# Patient Record
Sex: Female | Born: 1950
Health system: Southern US, Community
[De-identification: ages and names within clinical notes are randomized; demographics above are authoritative.]

## PROBLEM LIST (undated history)

## (undated) DIAGNOSIS — H269 Unspecified cataract: Secondary | ICD-10-CM

## (undated) DIAGNOSIS — E119 Type 2 diabetes mellitus without complications: Secondary | ICD-10-CM

## (undated) DIAGNOSIS — K635 Polyp of colon: Secondary | ICD-10-CM

## (undated) DIAGNOSIS — J40 Bronchitis, not specified as acute or chronic: Secondary | ICD-10-CM

## (undated) DIAGNOSIS — N200 Calculus of kidney: Secondary | ICD-10-CM

## (undated) DIAGNOSIS — I10 Essential (primary) hypertension: Secondary | ICD-10-CM

## (undated) DIAGNOSIS — G729 Myopathy, unspecified: Secondary | ICD-10-CM

## (undated) DIAGNOSIS — J45909 Unspecified asthma, uncomplicated: Secondary | ICD-10-CM

## (undated) DIAGNOSIS — C50919 Malignant neoplasm of unspecified site of unspecified female breast: Secondary | ICD-10-CM

## (undated) DIAGNOSIS — R600 Localized edema: Secondary | ICD-10-CM

## (undated) DIAGNOSIS — D649 Anemia, unspecified: Secondary | ICD-10-CM

## (undated) DIAGNOSIS — I447 Left bundle-branch block, unspecified: Secondary | ICD-10-CM

## (undated) DIAGNOSIS — F419 Anxiety disorder, unspecified: Secondary | ICD-10-CM

## (undated) DIAGNOSIS — K219 Gastro-esophageal reflux disease without esophagitis: Secondary | ICD-10-CM

## (undated) DIAGNOSIS — G4730 Sleep apnea, unspecified: Secondary | ICD-10-CM

## (undated) DIAGNOSIS — Z8669 Personal history of other diseases of the nervous system and sense organs: Secondary | ICD-10-CM

## (undated) DIAGNOSIS — N281 Cyst of kidney, acquired: Secondary | ICD-10-CM

## (undated) DIAGNOSIS — F32A Depression, unspecified: Secondary | ICD-10-CM

## (undated) DIAGNOSIS — M549 Dorsalgia, unspecified: Secondary | ICD-10-CM

## (undated) DIAGNOSIS — R05 Cough: Secondary | ICD-10-CM

## (undated) DIAGNOSIS — B9620 Unspecified Escherichia coli [E. coli] as the cause of diseases classified elsewhere: Secondary | ICD-10-CM

## (undated) DIAGNOSIS — E785 Hyperlipidemia, unspecified: Secondary | ICD-10-CM

## (undated) DIAGNOSIS — K529 Noninfective gastroenteritis and colitis, unspecified: Secondary | ICD-10-CM

## (undated) DIAGNOSIS — M1990 Unspecified osteoarthritis, unspecified site: Secondary | ICD-10-CM

## (undated) DIAGNOSIS — N390 Urinary tract infection, site not specified: Secondary | ICD-10-CM

## (undated) DIAGNOSIS — F329 Major depressive disorder, single episode, unspecified: Secondary | ICD-10-CM

## (undated) DIAGNOSIS — Z09 Encounter for follow-up examination after completed treatment for conditions other than malignant neoplasm: Secondary | ICD-10-CM

## (undated) HISTORY — PX: LAMINECTOMY: SHX219

## (undated) HISTORY — DX: Urinary tract infection, site not specified: B96.20

## (undated) HISTORY — DX: Type 2 diabetes mellitus without complications: E11.9

## (undated) HISTORY — DX: Essential (primary) hypertension: I10

## (undated) HISTORY — DX: Cyst of kidney, acquired: N28.1

## (undated) HISTORY — DX: Hyperlipidemia, unspecified: E78.5

## (undated) HISTORY — DX: Cough: R05

## (undated) HISTORY — DX: Unspecified cataract: H26.9

## (undated) HISTORY — DX: Noninfective gastroenteritis and colitis, unspecified: K52.9

## (undated) HISTORY — DX: Depression, unspecified: F32.A

## (undated) HISTORY — PX: ABDOMINAL HYSTERECTOMY: SHX81

## (undated) HISTORY — DX: Polyp of colon: K63.5

## (undated) HISTORY — PX: MASTECTOMY: SHX3

## (undated) HISTORY — DX: Urinary tract infection, site not specified: N39.0

## (undated) HISTORY — PX: TONSILLECTOMY: SUR1361

## (undated) HISTORY — DX: Major depressive disorder, single episode, unspecified: F32.9

## (undated) HISTORY — DX: Localized edema: R60.0

## (undated) HISTORY — DX: Calculus of kidney: N20.0

## (undated) HISTORY — DX: Myopathy, unspecified: G72.9

## (undated) HISTORY — PX: APPENDECTOMY: SHX54

## (undated) HISTORY — DX: Unspecified osteoarthritis, unspecified site: M19.90

## (undated) HISTORY — DX: Malignant neoplasm of unspecified site of unspecified female breast: C50.919

## (undated) HISTORY — DX: Unspecified Escherichia coli (E. coli) as the cause of diseases classified elsewhere: B96.20

## (undated) HISTORY — PX: INCONTINENCE SURGERY: SHX676

## (undated) HISTORY — PX: OOPHORECTOMY: SHX86

## (undated) HISTORY — DX: Dorsalgia, unspecified: M54.9

## (undated) HISTORY — DX: Bronchitis, not specified as acute or chronic: J40

## (undated) HISTORY — DX: Encounter for follow-up examination after completed treatment for conditions other than malignant neoplasm: Z09

---

## 1998-11-29 ENCOUNTER — Ambulatory Visit (HOSPITAL_COMMUNITY): Admission: RE | Admit: 1998-11-29 | Discharge: 1998-11-29 | Payer: Self-pay | Admitting: *Deleted

## 1998-11-29 ENCOUNTER — Encounter: Payer: Self-pay | Admitting: *Deleted

## 1998-12-10 ENCOUNTER — Emergency Department (HOSPITAL_COMMUNITY): Admission: EM | Admit: 1998-12-10 | Discharge: 1998-12-10 | Payer: Self-pay | Admitting: Emergency Medicine

## 1998-12-10 ENCOUNTER — Encounter: Payer: Self-pay | Admitting: Emergency Medicine

## 1998-12-17 ENCOUNTER — Encounter: Admission: RE | Admit: 1998-12-17 | Discharge: 1999-01-22 | Payer: Self-pay | Admitting: Family Medicine

## 1999-01-22 ENCOUNTER — Encounter: Admission: RE | Admit: 1999-01-22 | Discharge: 1999-04-22 | Payer: Self-pay | Admitting: Family Medicine

## 1999-07-02 ENCOUNTER — Encounter: Payer: Self-pay | Admitting: *Deleted

## 1999-07-02 ENCOUNTER — Encounter: Admission: RE | Admit: 1999-07-02 | Discharge: 1999-07-02 | Payer: Self-pay | Admitting: *Deleted

## 2000-06-30 ENCOUNTER — Ambulatory Visit (HOSPITAL_COMMUNITY): Admission: RE | Admit: 2000-06-30 | Discharge: 2000-06-30 | Payer: Self-pay | Admitting: Gastroenterology

## 2000-11-11 ENCOUNTER — Encounter: Payer: Self-pay | Admitting: Emergency Medicine

## 2000-11-11 ENCOUNTER — Emergency Department (HOSPITAL_COMMUNITY): Admission: EM | Admit: 2000-11-11 | Discharge: 2000-11-11 | Payer: Self-pay | Admitting: Emergency Medicine

## 2002-12-22 LAB — HM COLONOSCOPY: HM Colonoscopy: NORMAL

## 2003-01-04 ENCOUNTER — Encounter: Admission: RE | Admit: 2003-01-04 | Discharge: 2003-01-04 | Payer: Self-pay | Admitting: Endocrinology

## 2003-01-04 ENCOUNTER — Encounter: Payer: Self-pay | Admitting: Endocrinology

## 2003-08-17 ENCOUNTER — Emergency Department (HOSPITAL_COMMUNITY): Admission: EM | Admit: 2003-08-17 | Discharge: 2003-08-17 | Payer: Self-pay | Admitting: Emergency Medicine

## 2003-10-26 ENCOUNTER — Inpatient Hospital Stay (HOSPITAL_COMMUNITY): Admission: RE | Admit: 2003-10-26 | Discharge: 2003-10-28 | Payer: Self-pay | Admitting: Specialist

## 2004-01-16 ENCOUNTER — Ambulatory Visit (HOSPITAL_COMMUNITY): Admission: RE | Admit: 2004-01-16 | Discharge: 2004-01-16 | Payer: Self-pay | Admitting: Endocrinology

## 2004-02-29 ENCOUNTER — Ambulatory Visit: Payer: Self-pay | Admitting: Family Medicine

## 2004-03-14 ENCOUNTER — Ambulatory Visit: Payer: Self-pay | Admitting: Family Medicine

## 2004-04-04 ENCOUNTER — Ambulatory Visit: Payer: Self-pay | Admitting: Family Medicine

## 2004-04-04 ENCOUNTER — Ambulatory Visit: Payer: Self-pay | Admitting: *Deleted

## 2004-05-02 ENCOUNTER — Ambulatory Visit: Payer: Self-pay | Admitting: Family Medicine

## 2004-05-28 ENCOUNTER — Ambulatory Visit: Payer: Self-pay | Admitting: Family Medicine

## 2004-06-27 ENCOUNTER — Ambulatory Visit: Payer: Self-pay | Admitting: Family Medicine

## 2004-07-11 ENCOUNTER — Ambulatory Visit: Payer: Self-pay | Admitting: Family Medicine

## 2004-08-15 ENCOUNTER — Ambulatory Visit: Payer: Self-pay | Admitting: Family Medicine

## 2004-09-19 ENCOUNTER — Ambulatory Visit: Payer: Self-pay | Admitting: Family Medicine

## 2004-10-24 ENCOUNTER — Ambulatory Visit: Payer: Self-pay | Admitting: Family Medicine

## 2004-10-25 ENCOUNTER — Ambulatory Visit (HOSPITAL_COMMUNITY): Admission: RE | Admit: 2004-10-25 | Discharge: 2004-10-25 | Payer: Self-pay | Admitting: Family Medicine

## 2004-11-28 ENCOUNTER — Ambulatory Visit: Payer: Self-pay | Admitting: Family Medicine

## 2005-01-02 ENCOUNTER — Ambulatory Visit: Payer: Self-pay | Admitting: Family Medicine

## 2005-01-20 ENCOUNTER — Ambulatory Visit (HOSPITAL_COMMUNITY): Admission: RE | Admit: 2005-01-20 | Discharge: 2005-01-20 | Payer: Self-pay | Admitting: Family Medicine

## 2005-02-05 ENCOUNTER — Ambulatory Visit: Payer: Self-pay | Admitting: Family Medicine

## 2005-02-10 ENCOUNTER — Ambulatory Visit: Payer: Self-pay | Admitting: Family Medicine

## 2005-02-24 ENCOUNTER — Ambulatory Visit: Payer: Self-pay | Admitting: Family Medicine

## 2005-02-26 ENCOUNTER — Ambulatory Visit: Payer: Self-pay | Admitting: Family Medicine

## 2005-03-11 ENCOUNTER — Ambulatory Visit: Payer: Self-pay | Admitting: Family Medicine

## 2005-03-24 ENCOUNTER — Ambulatory Visit: Payer: Self-pay | Admitting: Family Medicine

## 2005-03-30 ENCOUNTER — Ambulatory Visit: Payer: Self-pay | Admitting: Family Medicine

## 2005-04-08 ENCOUNTER — Ambulatory Visit: Payer: Self-pay | Admitting: Family Medicine

## 2005-04-16 ENCOUNTER — Ambulatory Visit: Payer: Self-pay | Admitting: Family Medicine

## 2005-04-19 ENCOUNTER — Emergency Department (HOSPITAL_COMMUNITY): Admission: EM | Admit: 2005-04-19 | Discharge: 2005-04-20 | Payer: Self-pay | Admitting: Emergency Medicine

## 2005-04-20 ENCOUNTER — Ambulatory Visit: Payer: Self-pay | Admitting: Family Medicine

## 2005-04-24 ENCOUNTER — Ambulatory Visit: Payer: Self-pay | Admitting: Family Medicine

## 2005-04-30 ENCOUNTER — Inpatient Hospital Stay (HOSPITAL_COMMUNITY): Admission: EM | Admit: 2005-04-30 | Discharge: 2005-05-02 | Payer: Self-pay | Admitting: Emergency Medicine

## 2005-05-06 ENCOUNTER — Ambulatory Visit: Payer: Self-pay | Admitting: Family Medicine

## 2005-05-22 ENCOUNTER — Ambulatory Visit: Payer: Self-pay | Admitting: Family Medicine

## 2005-06-04 ENCOUNTER — Ambulatory Visit: Payer: Self-pay | Admitting: Family Medicine

## 2005-06-19 ENCOUNTER — Ambulatory Visit: Payer: Self-pay | Admitting: Family Medicine

## 2005-07-16 ENCOUNTER — Ambulatory Visit: Payer: Self-pay | Admitting: Family Medicine

## 2005-08-25 ENCOUNTER — Ambulatory Visit: Payer: Self-pay | Admitting: Family Medicine

## 2005-11-26 ENCOUNTER — Encounter
Admission: RE | Admit: 2005-11-26 | Discharge: 2006-02-24 | Payer: Self-pay | Admitting: Physical Medicine & Rehabilitation

## 2006-02-01 ENCOUNTER — Ambulatory Visit (HOSPITAL_COMMUNITY): Admission: RE | Admit: 2006-02-01 | Discharge: 2006-02-01 | Payer: Self-pay | Admitting: Internal Medicine

## 2006-02-01 ENCOUNTER — Ambulatory Visit: Payer: Self-pay | Admitting: Internal Medicine

## 2006-02-01 LAB — CONVERTED CEMR LAB
ALT: 14 units/L (ref 0–35)
AST: 16 units/L (ref 0–37)
Albumin: 4.3 g/dL (ref 3.5–5.2)
Alkaline Phosphatase: 66 units/L (ref 39–117)
BUN: 19 mg/dL (ref 6–23)
CO2: 27 meq/L (ref 19–32)
Calcium: 9.9 mg/dL (ref 8.4–10.5)
Chloride: 105 meq/L (ref 96–112)
Cholesterol: 288 mg/dL — ABNORMAL HIGH (ref 0–200)
Creatinine, Ser: 1.19 mg/dL (ref 0.40–1.20)
Glucose, Bld: 90 mg/dL (ref 70–99)
HDL: 43 mg/dL (ref >39)
LDL Cholesterol: 189 mg/dL — ABNORMAL HIGH (ref 0–99)
Potassium: 4.3 meq/L (ref 3.5–5.3)
Sodium: 146 meq/L — ABNORMAL HIGH (ref 135–145)
Total Bilirubin: 0.5 mg/dL (ref 0.3–1.2)
Total CHOL/HDL Ratio: 6.7
Total CK: 227 units/L — ABNORMAL HIGH (ref 7–177)
Total Protein: 7.3 g/dL (ref 6.0–8.3)
Triglycerides: 281 mg/dL — ABNORMAL HIGH (ref <150)
VLDL: 56 mg/dL — ABNORMAL HIGH (ref 0–40)

## 2006-02-08 ENCOUNTER — Ambulatory Visit: Payer: Self-pay | Admitting: Hospitalist

## 2006-02-10 ENCOUNTER — Ambulatory Visit (HOSPITAL_COMMUNITY): Admission: RE | Admit: 2006-02-10 | Discharge: 2006-02-10 | Payer: Self-pay | Admitting: Internal Medicine

## 2006-02-22 ENCOUNTER — Ambulatory Visit: Payer: Self-pay | Admitting: Internal Medicine

## 2006-02-22 DIAGNOSIS — E1159 Type 2 diabetes mellitus with other circulatory complications: Secondary | ICD-10-CM

## 2006-02-22 DIAGNOSIS — E1169 Type 2 diabetes mellitus with other specified complication: Secondary | ICD-10-CM | POA: Insufficient documentation

## 2006-02-22 DIAGNOSIS — F3289 Other specified depressive episodes: Secondary | ICD-10-CM | POA: Insufficient documentation

## 2006-02-22 DIAGNOSIS — I10 Essential (primary) hypertension: Secondary | ICD-10-CM

## 2006-02-22 DIAGNOSIS — I152 Hypertension secondary to endocrine disorders: Secondary | ICD-10-CM | POA: Insufficient documentation

## 2006-02-22 DIAGNOSIS — E785 Hyperlipidemia, unspecified: Secondary | ICD-10-CM

## 2006-02-22 DIAGNOSIS — F329 Major depressive disorder, single episode, unspecified: Secondary | ICD-10-CM | POA: Insufficient documentation

## 2006-02-26 ENCOUNTER — Encounter: Admission: RE | Admit: 2006-02-26 | Discharge: 2006-03-08 | Payer: Self-pay | Admitting: Internal Medicine

## 2006-03-01 ENCOUNTER — Ambulatory Visit: Payer: Self-pay | Admitting: Internal Medicine

## 2006-03-01 LAB — CONVERTED CEMR LAB
BUN: 31 mg/dL — ABNORMAL HIGH (ref 6–23)
CO2: 25 meq/L (ref 19–32)
Calcium: 9.4 mg/dL (ref 8.4–10.5)
Chloride: 107 meq/L (ref 96–112)
Creatinine, Ser: 1.51 mg/dL — ABNORMAL HIGH (ref 0.40–1.20)
Glucose, Bld: 101 mg/dL — ABNORMAL HIGH (ref 70–99)
Potassium: 4 meq/L (ref 3.5–5.3)
Sodium: 142 meq/L (ref 135–145)

## 2006-04-20 ENCOUNTER — Ambulatory Visit: Payer: Self-pay | Admitting: Internal Medicine

## 2006-04-20 ENCOUNTER — Encounter (INDEPENDENT_AMBULATORY_CARE_PROVIDER_SITE_OTHER): Payer: Self-pay | Admitting: Internal Medicine

## 2006-04-24 LAB — CONVERTED CEMR LAB
ALT: 11 units/L (ref 0–35)
AST: 16 units/L (ref 0–37)
Albumin: 4.3 g/dL (ref 3.5–5.2)
Alkaline Phosphatase: 65 units/L (ref 39–117)
BUN: 16 mg/dL (ref 6–23)
CO2: 26 meq/L (ref 19–32)
Calcium: 9.5 mg/dL (ref 8.4–10.5)
Chloride: 107 meq/L (ref 96–112)
Creatinine, Ser: 1.2 mg/dL (ref 0.40–1.20)
Free T4: 1.2 ng/dL (ref 0.89–1.80)
Glucose, Bld: 81 mg/dL (ref 70–99)
HCT: 36.7 % (ref 36.0–46.0)
Hemoglobin: 12.1 g/dL (ref 12.0–15.0)
MCHC: 33 g/dL (ref 30.0–36.0)
MCV: 80.7 fL (ref 78.0–100.0)
Platelets: 156 10*3/uL (ref 150–400)
Potassium: 4.1 meq/L (ref 3.5–5.3)
RBC: 4.55 M/uL (ref 3.87–5.11)
RDW: 14.6 % — ABNORMAL HIGH (ref 11.5–14.0)
Sodium: 143 meq/L (ref 135–145)
TSH: 1.307 microintl units/mL (ref 0.350–5.50)
Total Bilirubin: 0.7 mg/dL (ref 0.3–1.2)
Total Protein: 7.2 g/dL (ref 6.0–8.3)
WBC: 6.7 10*3/uL (ref 4.0–10.5)

## 2006-04-26 ENCOUNTER — Ambulatory Visit (HOSPITAL_COMMUNITY): Admission: RE | Admit: 2006-04-26 | Discharge: 2006-04-26 | Payer: Self-pay | Admitting: Internal Medicine

## 2006-04-27 ENCOUNTER — Encounter: Admission: RE | Admit: 2006-04-27 | Discharge: 2006-05-11 | Payer: Self-pay | Admitting: Internal Medicine

## 2006-05-04 ENCOUNTER — Encounter (INDEPENDENT_AMBULATORY_CARE_PROVIDER_SITE_OTHER): Payer: Self-pay | Admitting: *Deleted

## 2006-05-04 ENCOUNTER — Ambulatory Visit: Payer: Self-pay | Admitting: Internal Medicine

## 2006-05-04 LAB — CONVERTED CEMR LAB
BUN: 17 mg/dL (ref 6–23)
Bilirubin Urine: NEGATIVE
Bilirubin Urine: NEGATIVE
Blood in Urine, dipstick: NEGATIVE
CO2: 30 meq/L (ref 19–32)
Calcium: 10 mg/dL (ref 8.4–10.5)
Chloride: 99 meq/L (ref 96–112)
Creatinine, Ser: 1.16 mg/dL (ref 0.40–1.20)
Creatinine, Urine: 197.1 mg/dL
Glucose, Bld: 119 mg/dL — ABNORMAL HIGH (ref 70–99)
Glucose, Urine, Semiquant: NEGATIVE
Hemoglobin, Urine: NEGATIVE
Ketones, ur: NEGATIVE mg/dL
Ketones, urine, test strip: NEGATIVE
Leukocytes, UA: NEGATIVE
Microalb Creat Ratio: 8.1 mg/g (ref 0.0–30.0)
Microalb, Ur: 1.6 mg/dL (ref 0.00–1.89)
Nitrite: NEGATIVE
Nitrite: NEGATIVE
Potassium: 3.5 meq/L (ref 3.5–5.3)
Protein, U semiquant: NEGATIVE
Protein, ur: NEGATIVE mg/dL
Sodium: 141 meq/L (ref 135–145)
Specific Gravity, Urine: 1.015
Specific Gravity, Urine: 1.014 (ref 1.005–1.03)
Urine Glucose: NEGATIVE mg/dL
Urobilinogen, UA: 0.2
Urobilinogen, UA: 0.2 (ref 0.0–1.0)
WBC Urine, dipstick: NEGATIVE
pH: 5
pH: 6 (ref 5.0–8.0)

## 2006-05-19 ENCOUNTER — Ambulatory Visit: Payer: Self-pay | Admitting: Internal Medicine

## 2006-05-19 LAB — CONVERTED CEMR LAB
Candida species: NEGATIVE
Gardnerella vaginalis: NEGATIVE
Trichomonal Vaginitis: NEGATIVE

## 2006-05-26 LAB — CONVERTED CEMR LAB
BUN: 18 mg/dL (ref 6–23)
CO2: 25 meq/L (ref 19–32)
Calcium: 9.8 mg/dL (ref 8.4–10.5)
Chloride: 103 meq/L (ref 96–112)
Creatinine, Ser: 1.26 mg/dL — ABNORMAL HIGH (ref 0.40–1.20)
Glucose, Bld: 108 mg/dL — ABNORMAL HIGH (ref 70–99)
Potassium: 3.3 meq/L — ABNORMAL LOW (ref 3.5–5.3)
Sodium: 141 meq/L (ref 135–145)

## 2006-06-16 ENCOUNTER — Telehealth: Payer: Self-pay | Admitting: *Deleted

## 2006-08-17 ENCOUNTER — Ambulatory Visit: Payer: Self-pay | Admitting: Internal Medicine

## 2006-08-17 LAB — CONVERTED CEMR LAB
BUN: 23 mg/dL (ref 6–23)
CO2: 29 meq/L (ref 19–32)
Calcium: 9.8 mg/dL (ref 8.4–10.5)
Chloride: 103 meq/L (ref 96–112)
Creatinine, Ser: 1.27 mg/dL — ABNORMAL HIGH (ref 0.40–1.20)
Glucose, Bld: 96 mg/dL (ref 70–99)
Potassium: 3.8 meq/L (ref 3.5–5.3)
Sodium: 142 meq/L (ref 135–145)

## 2006-08-26 ENCOUNTER — Encounter (INDEPENDENT_AMBULATORY_CARE_PROVIDER_SITE_OTHER): Payer: Self-pay | Admitting: Internal Medicine

## 2006-08-26 ENCOUNTER — Ambulatory Visit (HOSPITAL_COMMUNITY): Admission: RE | Admit: 2006-08-26 | Discharge: 2006-08-26 | Payer: Self-pay | Admitting: Internal Medicine

## 2006-08-27 ENCOUNTER — Ambulatory Visit: Payer: Self-pay | Admitting: Internal Medicine

## 2006-08-30 ENCOUNTER — Encounter (INDEPENDENT_AMBULATORY_CARE_PROVIDER_SITE_OTHER): Payer: Self-pay | Admitting: Internal Medicine

## 2006-08-30 ENCOUNTER — Telehealth: Payer: Self-pay | Admitting: *Deleted

## 2006-08-30 DIAGNOSIS — N183 Chronic kidney disease, stage 3 unspecified: Secondary | ICD-10-CM | POA: Insufficient documentation

## 2006-08-30 DIAGNOSIS — I519 Heart disease, unspecified: Secondary | ICD-10-CM | POA: Insufficient documentation

## 2006-08-30 LAB — CONVERTED CEMR LAB
BUN: 24 mg/dL — ABNORMAL HIGH (ref 6–23)
CO2: 27 meq/L (ref 19–32)
Calcium: 9.6 mg/dL (ref 8.4–10.5)
Chloride: 103 meq/L (ref 96–112)
Cholesterol: 259 mg/dL — ABNORMAL HIGH (ref 0–200)
Creatinine, Ser: 1.38 mg/dL — ABNORMAL HIGH (ref 0.40–1.20)
Glucose, Bld: 100 mg/dL — ABNORMAL HIGH (ref 70–99)
HDL: 46 mg/dL (ref >39)
LDL Cholesterol: 176 mg/dL — ABNORMAL HIGH (ref 0–99)
Potassium: 4.1 meq/L (ref 3.5–5.3)
Sodium: 142 meq/L (ref 135–145)
Total CHOL/HDL Ratio: 5.6
Triglycerides: 186 mg/dL — ABNORMAL HIGH (ref <150)
VLDL: 37 mg/dL (ref 0–40)

## 2006-09-03 ENCOUNTER — Ambulatory Visit (HOSPITAL_COMMUNITY): Admission: RE | Admit: 2006-09-03 | Discharge: 2006-09-03 | Payer: Self-pay | Admitting: Internal Medicine

## 2006-10-22 ENCOUNTER — Telehealth (INDEPENDENT_AMBULATORY_CARE_PROVIDER_SITE_OTHER): Payer: Self-pay | Admitting: *Deleted

## 2006-10-22 ENCOUNTER — Ambulatory Visit: Payer: Self-pay | Admitting: Internal Medicine

## 2006-10-22 ENCOUNTER — Encounter (INDEPENDENT_AMBULATORY_CARE_PROVIDER_SITE_OTHER): Payer: Self-pay | Admitting: *Deleted

## 2006-10-22 LAB — CONVERTED CEMR LAB
Bilirubin Urine: NEGATIVE
Blood in Urine, dipstick: NEGATIVE
Glucose, Urine, Semiquant: NEGATIVE
Ketones, urine, test strip: NEGATIVE
Nitrite: NEGATIVE
Protein, U semiquant: NEGATIVE
Specific Gravity, Urine: 1.015
Urobilinogen, UA: 0.2
WBC Urine, dipstick: NEGATIVE
pH: 5

## 2006-10-23 LAB — CONVERTED CEMR LAB
ALT: 15 units/L (ref 0–35)
AST: 16 units/L (ref 0–37)
Albumin: 4.5 g/dL (ref 3.5–5.2)
Alkaline Phosphatase: 67 units/L (ref 39–117)
BUN: 16 mg/dL (ref 6–23)
Basophils Absolute: 0 10*3/uL (ref 0.0–0.1)
Basophils Relative: 1 % (ref 0–1)
CO2: 26 meq/L (ref 19–32)
Calcium: 9.5 mg/dL (ref 8.4–10.5)
Chloride: 106 meq/L (ref 96–112)
Creatinine, Ser: 1.27 mg/dL — ABNORMAL HIGH (ref 0.40–1.20)
Eosinophils Absolute: 0.2 10*3/uL (ref 0.0–0.7)
Eosinophils Relative: 4 % (ref 0–5)
Glucose, Bld: 87 mg/dL (ref 70–99)
HCT: 38 % (ref 36.0–46.0)
Hemoglobin: 12.6 g/dL (ref 12.0–15.0)
Lipase: 87 units/L — ABNORMAL HIGH (ref 0–75)
Lymphocytes Relative: 45 % (ref 12–46)
Lymphs Abs: 2.3 10*3/uL (ref 0.7–3.3)
MCHC: 33.2 g/dL (ref 30.0–36.0)
MCV: 82.3 fL (ref 78.0–100.0)
Monocytes Absolute: 0.3 10*3/uL (ref 0.2–0.7)
Monocytes Relative: 5 % (ref 3–11)
Neutro Abs: 2.3 10*3/uL (ref 1.7–7.7)
Neutrophils Relative %: 46 % (ref 43–77)
Platelets: 137 10*3/uL — ABNORMAL LOW (ref 150–400)
Potassium: 4.1 meq/L (ref 3.5–5.3)
RBC: 4.62 M/uL (ref 3.87–5.11)
RDW: 13.8 % (ref 11.5–14.0)
Sodium: 142 meq/L (ref 135–145)
Total Bilirubin: 0.5 mg/dL (ref 0.3–1.2)
Total Protein: 7.5 g/dL (ref 6.0–8.3)
WBC: 5.1 10*3/uL (ref 4.0–10.5)

## 2006-10-26 ENCOUNTER — Ambulatory Visit: Payer: Self-pay | Admitting: Infectious Disease

## 2006-10-28 ENCOUNTER — Encounter (INDEPENDENT_AMBULATORY_CARE_PROVIDER_SITE_OTHER): Payer: Self-pay | Admitting: *Deleted

## 2006-10-28 ENCOUNTER — Ambulatory Visit (HOSPITAL_COMMUNITY): Admission: RE | Admit: 2006-10-28 | Discharge: 2006-10-28 | Payer: Self-pay | Admitting: *Deleted

## 2007-01-13 ENCOUNTER — Telehealth (INDEPENDENT_AMBULATORY_CARE_PROVIDER_SITE_OTHER): Payer: Self-pay | Admitting: *Deleted

## 2007-01-14 ENCOUNTER — Telehealth: Payer: Self-pay | Admitting: *Deleted

## 2007-01-21 ENCOUNTER — Encounter (INDEPENDENT_AMBULATORY_CARE_PROVIDER_SITE_OTHER): Payer: Self-pay | Admitting: *Deleted

## 2007-01-21 ENCOUNTER — Ambulatory Visit: Payer: Self-pay | Admitting: Internal Medicine

## 2007-02-10 ENCOUNTER — Ambulatory Visit: Payer: Self-pay | Admitting: Internal Medicine

## 2007-02-10 ENCOUNTER — Encounter (INDEPENDENT_AMBULATORY_CARE_PROVIDER_SITE_OTHER): Payer: Self-pay | Admitting: *Deleted

## 2007-02-10 LAB — CONVERTED CEMR LAB
Cholesterol: 238 mg/dL — ABNORMAL HIGH (ref 0–200)
HDL: 43 mg/dL (ref >39)
LDL Cholesterol: 168 mg/dL — ABNORMAL HIGH (ref 0–99)
Total CHOL/HDL Ratio: 5.5
Triglycerides: 136 mg/dL (ref <150)
VLDL: 27 mg/dL (ref 0–40)

## 2007-02-15 ENCOUNTER — Encounter (INDEPENDENT_AMBULATORY_CARE_PROVIDER_SITE_OTHER): Payer: Self-pay | Admitting: *Deleted

## 2007-02-15 ENCOUNTER — Ambulatory Visit: Payer: Self-pay | Admitting: Internal Medicine

## 2007-02-15 LAB — CONVERTED CEMR LAB
ALT: 18 units/L (ref 0–35)
AST: 20 units/L (ref 0–37)
Albumin: 4.4 g/dL (ref 3.5–5.2)
Alkaline Phosphatase: 71 units/L (ref 39–117)
BUN: 17 mg/dL (ref 6–23)
CO2: 26 meq/L (ref 19–32)
Calcium: 9.4 mg/dL (ref 8.4–10.5)
Chloride: 105 meq/L (ref 96–112)
Creatinine, Ser: 1.2 mg/dL (ref 0.40–1.20)
Glucose, Bld: 108 mg/dL — ABNORMAL HIGH (ref 70–99)
Potassium: 3.9 meq/L (ref 3.5–5.3)
Sodium: 142 meq/L (ref 135–145)
Total Bilirubin: 0.4 mg/dL (ref 0.3–1.2)
Total Protein: 7.5 g/dL (ref 6.0–8.3)

## 2007-03-03 ENCOUNTER — Ambulatory Visit: Payer: Self-pay | Admitting: Infectious Diseases

## 2007-03-08 ENCOUNTER — Ambulatory Visit (HOSPITAL_COMMUNITY): Admission: RE | Admit: 2007-03-08 | Discharge: 2007-03-08 | Payer: Self-pay | Admitting: Orthopaedic Surgery

## 2007-04-15 ENCOUNTER — Encounter (INDEPENDENT_AMBULATORY_CARE_PROVIDER_SITE_OTHER): Payer: Self-pay | Admitting: Internal Medicine

## 2007-04-15 ENCOUNTER — Ambulatory Visit: Payer: Self-pay | Admitting: Internal Medicine

## 2007-04-15 LAB — CONVERTED CEMR LAB
BUN: 22 mg/dL (ref 6–23)
CO2: 24 meq/L (ref 19–32)
Calcium: 9.1 mg/dL (ref 8.4–10.5)
Chloride: 101 meq/L (ref 96–112)
Creatinine, Ser: 1.34 mg/dL — ABNORMAL HIGH (ref 0.40–1.20)
Glucose, Bld: 94 mg/dL (ref 70–99)
Potassium: 4.1 meq/L (ref 3.5–5.3)
Sodium: 142 meq/L (ref 135–145)

## 2007-04-18 ENCOUNTER — Ambulatory Visit: Payer: Self-pay | Admitting: Internal Medicine

## 2007-05-09 ENCOUNTER — Telehealth: Payer: Self-pay | Admitting: *Deleted

## 2007-05-09 ENCOUNTER — Ambulatory Visit: Payer: Self-pay | Admitting: Internal Medicine

## 2007-05-11 ENCOUNTER — Encounter (INDEPENDENT_AMBULATORY_CARE_PROVIDER_SITE_OTHER): Payer: Self-pay | Admitting: Internal Medicine

## 2007-05-11 ENCOUNTER — Ambulatory Visit: Payer: Self-pay | Admitting: Internal Medicine

## 2007-05-11 LAB — CONVERTED CEMR LAB
BUN: 19 mg/dL (ref 6–23)
CO2: 25 meq/L (ref 19–32)
Calcium: 9.6 mg/dL (ref 8.4–10.5)
Chloride: 106 meq/L (ref 96–112)
Creatinine, Ser: 1.28 mg/dL — ABNORMAL HIGH (ref 0.40–1.20)
Glucose, Bld: 88 mg/dL (ref 70–99)
Potassium: 3.9 meq/L (ref 3.5–5.3)
Sodium: 144 meq/L (ref 135–145)

## 2007-06-23 ENCOUNTER — Encounter (INDEPENDENT_AMBULATORY_CARE_PROVIDER_SITE_OTHER): Payer: Self-pay | Admitting: Internal Medicine

## 2007-06-23 ENCOUNTER — Ambulatory Visit: Payer: Self-pay | Admitting: Hospitalist

## 2007-06-23 LAB — CONVERTED CEMR LAB
BUN: 23 mg/dL (ref 6–23)
CO2: 24 meq/L (ref 19–32)
Calcium: 9.9 mg/dL (ref 8.4–10.5)
Chloride: 106 meq/L (ref 96–112)
Creatinine, Ser: 1.13 mg/dL (ref 0.40–1.20)
Glucose, Bld: 93 mg/dL (ref 70–99)
Potassium: 4.1 meq/L (ref 3.5–5.3)
Sodium: 143 meq/L (ref 135–145)

## 2007-07-08 ENCOUNTER — Encounter (INDEPENDENT_AMBULATORY_CARE_PROVIDER_SITE_OTHER): Payer: Self-pay | Admitting: Internal Medicine

## 2007-07-08 ENCOUNTER — Ambulatory Visit: Payer: Self-pay | Admitting: Internal Medicine

## 2007-07-11 LAB — CONVERTED CEMR LAB
Cholesterol: 231 mg/dL — ABNORMAL HIGH (ref 0–200)
HDL: 46 mg/dL (ref >39)
LDL Cholesterol: 156 mg/dL — ABNORMAL HIGH (ref 0–99)
Total CHOL/HDL Ratio: 5
Triglycerides: 143 mg/dL (ref <150)
VLDL: 29 mg/dL (ref 0–40)

## 2007-08-12 ENCOUNTER — Encounter (INDEPENDENT_AMBULATORY_CARE_PROVIDER_SITE_OTHER): Payer: Self-pay | Admitting: Internal Medicine

## 2007-08-12 ENCOUNTER — Ambulatory Visit: Payer: Self-pay | Admitting: Internal Medicine

## 2007-08-12 LAB — CONVERTED CEMR LAB
BUN: 17 mg/dL (ref 6–23)
CO2: 26 meq/L (ref 19–32)
Calcium: 9.7 mg/dL (ref 8.4–10.5)
Chloride: 104 meq/L (ref 96–112)
Creatinine, Ser: 1.35 mg/dL — ABNORMAL HIGH (ref 0.40–1.20)
Glucose, Bld: 84 mg/dL (ref 70–99)
Potassium: 4.4 meq/L (ref 3.5–5.3)
Sodium: 143 meq/L (ref 135–145)

## 2007-11-05 ENCOUNTER — Emergency Department (HOSPITAL_COMMUNITY): Admission: EM | Admit: 2007-11-05 | Discharge: 2007-11-05 | Payer: Self-pay | Admitting: Emergency Medicine

## 2007-11-07 ENCOUNTER — Ambulatory Visit: Payer: Self-pay | Admitting: Internal Medicine

## 2007-11-07 ENCOUNTER — Encounter (INDEPENDENT_AMBULATORY_CARE_PROVIDER_SITE_OTHER): Payer: Self-pay | Admitting: Internal Medicine

## 2007-11-07 LAB — CONVERTED CEMR LAB
ALT: 15 units/L (ref 0–35)
AST: 16 units/L (ref 0–37)
Albumin: 4.6 g/dL (ref 3.5–5.2)
Alkaline Phosphatase: 65 units/L (ref 39–117)
BUN: 31 mg/dL — ABNORMAL HIGH (ref 6–23)
CO2: 22 meq/L (ref 19–32)
Calcium: 9.5 mg/dL (ref 8.4–10.5)
Chloride: 105 meq/L (ref 96–112)
Creatinine, Ser: 1.41 mg/dL — ABNORMAL HIGH (ref 0.40–1.20)
Glucose, Bld: 167 mg/dL — ABNORMAL HIGH (ref 70–99)
Potassium: 4 meq/L (ref 3.5–5.3)
Sodium: 143 meq/L (ref 135–145)
Total Bilirubin: 0.4 mg/dL (ref 0.3–1.2)
Total CK: 292 units/L — ABNORMAL HIGH (ref 7–177)
Total Protein: 7.8 g/dL (ref 6.0–8.3)

## 2007-11-11 ENCOUNTER — Encounter: Admission: RE | Admit: 2007-11-11 | Discharge: 2007-11-11 | Payer: Self-pay | Admitting: Internal Medicine

## 2007-12-09 ENCOUNTER — Encounter (INDEPENDENT_AMBULATORY_CARE_PROVIDER_SITE_OTHER): Payer: Self-pay | Admitting: Internal Medicine

## 2007-12-09 ENCOUNTER — Ambulatory Visit: Payer: Self-pay | Admitting: Internal Medicine

## 2007-12-09 ENCOUNTER — Telehealth (INDEPENDENT_AMBULATORY_CARE_PROVIDER_SITE_OTHER): Payer: Self-pay | Admitting: Internal Medicine

## 2007-12-09 ENCOUNTER — Ambulatory Visit (HOSPITAL_COMMUNITY): Admission: RE | Admit: 2007-12-09 | Discharge: 2007-12-09 | Payer: Self-pay | Admitting: Internal Medicine

## 2007-12-09 LAB — CONVERTED CEMR LAB
ALT: 20 units/L (ref 0–35)
AST: 21 units/L (ref 0–37)
Albumin: 4.4 g/dL (ref 3.5–5.2)
Alkaline Phosphatase: 56 units/L (ref 39–117)
BUN: 14 mg/dL (ref 6–23)
Basophils Absolute: 0.1 10*3/uL (ref 0.0–0.1)
Basophils Relative: 1 % (ref 0–1)
CO2: 27 meq/L (ref 19–32)
Calcium: 9.6 mg/dL (ref 8.4–10.5)
Chloride: 106 meq/L (ref 96–112)
Creatinine, Ser: 1.3 mg/dL — ABNORMAL HIGH (ref 0.40–1.20)
Eosinophils Absolute: 0.3 10*3/uL (ref 0.0–0.7)
Eosinophils Relative: 5 % (ref 0–5)
Glucose, Bld: 79 mg/dL (ref 70–99)
HCT: 35 % — ABNORMAL LOW (ref 36.0–46.0)
Hemoglobin: 11.3 g/dL — ABNORMAL LOW (ref 12.0–15.0)
Lymphocytes Relative: 45 % (ref 12–46)
Lymphs Abs: 2.7 10*3/uL (ref 0.7–4.0)
MCHC: 32.3 g/dL (ref 30.0–36.0)
MCV: 83.7 fL (ref 78.0–100.0)
Monocytes Absolute: 0.4 10*3/uL (ref 0.1–1.0)
Monocytes Relative: 6 % (ref 3–12)
Neutro Abs: 2.6 10*3/uL (ref 1.7–7.7)
Neutrophils Relative %: 43 % (ref 43–77)
Platelets: 191 10*3/uL (ref 150–400)
Potassium: 3.5 meq/L (ref 3.5–5.3)
RBC: 4.18 M/uL (ref 3.87–5.11)
RDW: 13.6 % (ref 11.5–15.5)
Sodium: 144 meq/L (ref 135–145)
Total Bilirubin: 0.6 mg/dL (ref 0.3–1.2)
Total Protein: 7 g/dL (ref 6.0–8.3)
WBC: 6.1 10*3/uL (ref 4.0–10.5)

## 2007-12-16 ENCOUNTER — Ambulatory Visit (HOSPITAL_COMMUNITY): Admission: RE | Admit: 2007-12-16 | Discharge: 2007-12-16 | Payer: Self-pay | Admitting: Internal Medicine

## 2007-12-16 ENCOUNTER — Encounter (INDEPENDENT_AMBULATORY_CARE_PROVIDER_SITE_OTHER): Payer: Self-pay | Admitting: Internal Medicine

## 2007-12-19 ENCOUNTER — Ambulatory Visit (HOSPITAL_COMMUNITY): Admission: RE | Admit: 2007-12-19 | Discharge: 2007-12-19 | Payer: Self-pay | Admitting: Internal Medicine

## 2007-12-19 LAB — PULMONARY FUNCTION TEST

## 2007-12-30 ENCOUNTER — Telehealth (INDEPENDENT_AMBULATORY_CARE_PROVIDER_SITE_OTHER): Payer: Self-pay | Admitting: Internal Medicine

## 2007-12-30 ENCOUNTER — Ambulatory Visit: Payer: Self-pay | Admitting: Infectious Disease

## 2008-02-09 ENCOUNTER — Encounter (INDEPENDENT_AMBULATORY_CARE_PROVIDER_SITE_OTHER): Payer: Self-pay | Admitting: Internal Medicine

## 2008-02-09 ENCOUNTER — Ambulatory Visit: Payer: Self-pay | Admitting: Internal Medicine

## 2008-02-09 ENCOUNTER — Telehealth: Payer: Self-pay | Admitting: *Deleted

## 2008-02-09 DIAGNOSIS — J45991 Cough variant asthma: Secondary | ICD-10-CM | POA: Insufficient documentation

## 2008-02-14 ENCOUNTER — Encounter (INDEPENDENT_AMBULATORY_CARE_PROVIDER_SITE_OTHER): Payer: Self-pay | Admitting: Internal Medicine

## 2008-02-14 ENCOUNTER — Emergency Department (HOSPITAL_COMMUNITY): Admission: EM | Admit: 2008-02-14 | Discharge: 2008-02-14 | Payer: Self-pay | Admitting: Family Medicine

## 2008-02-22 ENCOUNTER — Ambulatory Visit: Payer: Self-pay | Admitting: Internal Medicine

## 2008-03-01 ENCOUNTER — Encounter (INDEPENDENT_AMBULATORY_CARE_PROVIDER_SITE_OTHER): Payer: Self-pay | Admitting: Internal Medicine

## 2008-03-09 ENCOUNTER — Encounter: Admission: RE | Admit: 2008-03-09 | Discharge: 2008-03-09 | Payer: Self-pay | Admitting: Internal Medicine

## 2008-03-21 ENCOUNTER — Ambulatory Visit: Payer: Self-pay | Admitting: Infectious Diseases

## 2008-03-21 ENCOUNTER — Encounter (INDEPENDENT_AMBULATORY_CARE_PROVIDER_SITE_OTHER): Payer: Self-pay | Admitting: Internal Medicine

## 2008-04-11 LAB — CONVERTED CEMR LAB
Cholesterol: 210 mg/dL — ABNORMAL HIGH (ref 0–200)
HDL: 47 mg/dL (ref >39)
LDL Cholesterol: 125 mg/dL — ABNORMAL HIGH (ref 0–99)
Total CHOL/HDL Ratio: 4.5
Triglycerides: 190 mg/dL — ABNORMAL HIGH (ref <150)
VLDL: 38 mg/dL (ref 0–40)

## 2008-04-18 ENCOUNTER — Ambulatory Visit: Payer: Self-pay | Admitting: Internal Medicine

## 2008-04-18 ENCOUNTER — Encounter (INDEPENDENT_AMBULATORY_CARE_PROVIDER_SITE_OTHER): Payer: Self-pay | Admitting: Internal Medicine

## 2008-04-18 ENCOUNTER — Encounter: Payer: Self-pay | Admitting: Internal Medicine

## 2008-04-18 LAB — CONVERTED CEMR LAB
ALT: 14 units/L (ref 0–35)
AST: 17 units/L (ref 0–37)
Albumin: 4.4 g/dL (ref 3.5–5.2)
Alkaline Phosphatase: 57 units/L (ref 39–117)
BUN: 19 mg/dL (ref 6–23)
CO2: 26 meq/L (ref 19–32)
Calcium: 9.5 mg/dL (ref 8.4–10.5)
Chloride: 105 meq/L (ref 96–112)
Creatinine, Ser: 1.13 mg/dL (ref 0.40–1.20)
Glucose, Bld: 99 mg/dL (ref 70–99)
Potassium: 4.2 meq/L (ref 3.5–5.3)
Sodium: 141 meq/L (ref 135–145)
Total Bilirubin: 0.6 mg/dL (ref 0.3–1.2)
Total Protein: 7.2 g/dL (ref 6.0–8.3)

## 2008-04-25 ENCOUNTER — Telehealth (INDEPENDENT_AMBULATORY_CARE_PROVIDER_SITE_OTHER): Payer: Self-pay | Admitting: Internal Medicine

## 2008-07-06 ENCOUNTER — Ambulatory Visit: Payer: Self-pay | Admitting: Internal Medicine

## 2008-07-06 ENCOUNTER — Encounter (INDEPENDENT_AMBULATORY_CARE_PROVIDER_SITE_OTHER): Payer: Self-pay | Admitting: Internal Medicine

## 2008-07-06 LAB — CONVERTED CEMR LAB
Bilirubin Urine: NEGATIVE
Hemoglobin, Urine: NEGATIVE
Ketones, ur: NEGATIVE mg/dL
Leukocytes, UA: NEGATIVE
Nitrite: NEGATIVE
Protein, ur: NEGATIVE mg/dL
Specific Gravity, Urine: 1.012 (ref 1.005–1.030)
Urine Glucose: NEGATIVE mg/dL
Urobilinogen, UA: 1 (ref 0.0–1.0)
pH: 6.5 (ref 5.0–8.0)

## 2008-07-11 ENCOUNTER — Ambulatory Visit (HOSPITAL_COMMUNITY): Admission: RE | Admit: 2008-07-11 | Discharge: 2008-07-11 | Payer: Self-pay | Admitting: Internal Medicine

## 2008-07-26 ENCOUNTER — Observation Stay (HOSPITAL_COMMUNITY): Admission: EM | Admit: 2008-07-26 | Discharge: 2008-07-27 | Payer: Self-pay | Admitting: Emergency Medicine

## 2008-07-26 ENCOUNTER — Ambulatory Visit: Payer: Self-pay | Admitting: Internal Medicine

## 2008-07-27 ENCOUNTER — Encounter: Payer: Self-pay | Admitting: Internal Medicine

## 2008-07-27 DIAGNOSIS — M549 Dorsalgia, unspecified: Secondary | ICD-10-CM | POA: Insufficient documentation

## 2008-07-31 ENCOUNTER — Ambulatory Visit: Payer: Self-pay | Admitting: Infectious Disease

## 2008-07-31 ENCOUNTER — Encounter (INDEPENDENT_AMBULATORY_CARE_PROVIDER_SITE_OTHER): Payer: Self-pay | Admitting: *Deleted

## 2008-07-31 LAB — CONVERTED CEMR LAB
Bilirubin Urine: NEGATIVE
Hemoglobin, Urine: NEGATIVE
Ketones, ur: NEGATIVE mg/dL
Leukocytes, UA: NEGATIVE
Nitrite: NEGATIVE
Protein, ur: NEGATIVE mg/dL
Specific Gravity, Urine: 1.016 (ref 1.005–1.030)
Urine Glucose: NEGATIVE mg/dL
Urobilinogen, UA: 0.2 (ref 0.0–1.0)
pH: 6 (ref 5.0–8.0)

## 2008-08-03 ENCOUNTER — Encounter (INDEPENDENT_AMBULATORY_CARE_PROVIDER_SITE_OTHER): Payer: Self-pay | Admitting: Internal Medicine

## 2008-08-14 ENCOUNTER — Encounter (INDEPENDENT_AMBULATORY_CARE_PROVIDER_SITE_OTHER): Payer: Self-pay | Admitting: Internal Medicine

## 2008-09-12 ENCOUNTER — Ambulatory Visit (HOSPITAL_COMMUNITY): Admission: RE | Admit: 2008-09-12 | Discharge: 2008-09-12 | Payer: Self-pay | Admitting: Internal Medicine

## 2008-09-12 ENCOUNTER — Ambulatory Visit: Payer: Self-pay | Admitting: Internal Medicine

## 2008-09-12 ENCOUNTER — Encounter (INDEPENDENT_AMBULATORY_CARE_PROVIDER_SITE_OTHER): Payer: Self-pay | Admitting: Internal Medicine

## 2008-09-14 LAB — CONVERTED CEMR LAB
ALT: 17 units/L (ref 0–35)
AST: 17 units/L (ref 0–37)
Albumin: 4.6 g/dL (ref 3.5–5.2)
Alkaline Phosphatase: 59 units/L (ref 39–117)
BUN: 19 mg/dL (ref 6–23)
Basophils Absolute: 0 10*3/uL (ref 0.0–0.1)
Basophils Relative: 1 % (ref 0–1)
CO2: 25 meq/L (ref 19–32)
Calcium: 9.8 mg/dL (ref 8.4–10.5)
Chloride: 105 meq/L (ref 96–112)
Cholesterol: 218 mg/dL — ABNORMAL HIGH (ref 0–200)
Creatinine, Ser: 1.57 mg/dL — ABNORMAL HIGH (ref 0.40–1.20)
Eosinophils Absolute: 0.2 10*3/uL (ref 0.0–0.7)
Eosinophils Relative: 3 % (ref 0–5)
GFR calc Af Amer: 41 mL/min — ABNORMAL LOW (ref >60)
GFR calc non Af Amer: 34 mL/min — ABNORMAL LOW (ref >60)
Glucose, Bld: 80 mg/dL (ref 70–99)
HCT: 36.6 % (ref 36.0–46.0)
HDL: 45 mg/dL (ref >39)
Hemoglobin: 12 g/dL (ref 12.0–15.0)
LDL Cholesterol: 131 mg/dL — ABNORMAL HIGH (ref 0–99)
Lymphocytes Relative: 53 % — ABNORMAL HIGH (ref 12–46)
Lymphs Abs: 3.2 10*3/uL (ref 0.7–4.0)
MCHC: 32.8 g/dL (ref 30.0–36.0)
MCV: 81.5 fL (ref 78.0–100.0)
Monocytes Absolute: 0.5 10*3/uL (ref 0.1–1.0)
Monocytes Relative: 8 % (ref 3–12)
Neutro Abs: 2.2 10*3/uL (ref 1.7–7.7)
Neutrophils Relative %: 36 % — ABNORMAL LOW (ref 43–77)
Platelets: 229 10*3/uL (ref 150–400)
Potassium: 4.2 meq/L (ref 3.5–5.3)
RBC: 4.49 M/uL (ref 3.87–5.11)
RDW: 13.3 % (ref 11.5–15.5)
Sodium: 142 meq/L (ref 135–145)
Total Bilirubin: 0.5 mg/dL (ref 0.3–1.2)
Total CHOL/HDL Ratio: 4.8
Total Protein: 7.3 g/dL (ref 6.0–8.3)
Triglycerides: 208 mg/dL — ABNORMAL HIGH (ref <150)
VLDL: 42 mg/dL — ABNORMAL HIGH (ref 0–40)
WBC: 6.1 10*3/uL (ref 4.0–10.5)

## 2008-09-27 ENCOUNTER — Telehealth: Payer: Self-pay | Admitting: *Deleted

## 2008-10-01 ENCOUNTER — Emergency Department (HOSPITAL_COMMUNITY): Admission: EM | Admit: 2008-10-01 | Discharge: 2008-10-01 | Payer: Self-pay | Admitting: Emergency Medicine

## 2008-10-10 ENCOUNTER — Ambulatory Visit: Payer: Self-pay | Admitting: Internal Medicine

## 2008-10-17 ENCOUNTER — Telehealth (INDEPENDENT_AMBULATORY_CARE_PROVIDER_SITE_OTHER): Payer: Self-pay | Admitting: Internal Medicine

## 2009-03-04 ENCOUNTER — Telehealth (INDEPENDENT_AMBULATORY_CARE_PROVIDER_SITE_OTHER): Payer: Self-pay | Admitting: Internal Medicine

## 2009-03-19 ENCOUNTER — Telehealth (INDEPENDENT_AMBULATORY_CARE_PROVIDER_SITE_OTHER): Payer: Self-pay | Admitting: *Deleted

## 2009-03-23 HISTORY — PX: BREAST SURGERY: SHX581

## 2009-03-23 LAB — HM MAMMOGRAPHY

## 2009-04-05 ENCOUNTER — Ambulatory Visit (HOSPITAL_COMMUNITY): Admission: RE | Admit: 2009-04-05 | Discharge: 2009-04-05 | Payer: Self-pay | Admitting: Internal Medicine

## 2009-04-12 ENCOUNTER — Encounter: Admission: RE | Admit: 2009-04-12 | Discharge: 2009-04-12 | Payer: Self-pay | Admitting: Internal Medicine

## 2009-04-26 ENCOUNTER — Ambulatory Visit: Payer: Self-pay | Admitting: Internal Medicine

## 2009-04-26 DIAGNOSIS — G479 Sleep disorder, unspecified: Secondary | ICD-10-CM | POA: Insufficient documentation

## 2009-04-26 LAB — CONVERTED CEMR LAB
ALT: 17 units/L (ref 0–35)
AST: 18 units/L (ref 0–37)
Albumin: 4.3 g/dL (ref 3.5–5.2)
Alkaline Phosphatase: 58 units/L (ref 39–117)
BUN: 20 mg/dL (ref 6–23)
CO2: 27 meq/L (ref 19–32)
Calcium: 9.1 mg/dL (ref 8.4–10.5)
Chloride: 104 meq/L (ref 96–112)
Cholesterol: 258 mg/dL — ABNORMAL HIGH (ref 0–200)
Creatinine, Ser: 1.17 mg/dL (ref 0.40–1.20)
Glucose, Bld: 74 mg/dL (ref 70–99)
HDL: 42 mg/dL (ref >39)
LDL Cholesterol: 177 mg/dL — ABNORMAL HIGH (ref 0–99)
Potassium: 3.9 meq/L (ref 3.5–5.3)
Sodium: 143 meq/L (ref 135–145)
Total Bilirubin: 0.5 mg/dL (ref 0.3–1.2)
Total CHOL/HDL Ratio: 6.1
Total Protein: 7.3 g/dL (ref 6.0–8.3)
Triglycerides: 193 mg/dL — ABNORMAL HIGH (ref <150)
VLDL: 39 mg/dL (ref 0–40)

## 2009-05-06 ENCOUNTER — Telehealth (INDEPENDENT_AMBULATORY_CARE_PROVIDER_SITE_OTHER): Payer: Self-pay | Admitting: Internal Medicine

## 2009-05-08 ENCOUNTER — Telehealth (INDEPENDENT_AMBULATORY_CARE_PROVIDER_SITE_OTHER): Payer: Self-pay | Admitting: Internal Medicine

## 2009-06-17 ENCOUNTER — Encounter (INDEPENDENT_AMBULATORY_CARE_PROVIDER_SITE_OTHER): Payer: Self-pay | Admitting: Internal Medicine

## 2009-07-16 ENCOUNTER — Encounter (INDEPENDENT_AMBULATORY_CARE_PROVIDER_SITE_OTHER): Payer: Self-pay | Admitting: Internal Medicine

## 2009-10-15 ENCOUNTER — Ambulatory Visit: Payer: Self-pay | Admitting: Internal Medicine

## 2009-10-15 DIAGNOSIS — K219 Gastro-esophageal reflux disease without esophagitis: Secondary | ICD-10-CM | POA: Insufficient documentation

## 2009-12-30 ENCOUNTER — Encounter: Payer: Self-pay | Admitting: Internal Medicine

## 2009-12-30 ENCOUNTER — Telehealth: Payer: Self-pay | Admitting: *Deleted

## 2010-01-08 ENCOUNTER — Ambulatory Visit: Payer: Self-pay | Admitting: Internal Medicine

## 2010-01-13 ENCOUNTER — Encounter: Admission: RE | Admit: 2010-01-13 | Discharge: 2010-01-13 | Payer: Self-pay | Admitting: Internal Medicine

## 2010-01-14 ENCOUNTER — Encounter: Admission: RE | Admit: 2010-01-14 | Discharge: 2010-01-14 | Payer: Self-pay | Admitting: Internal Medicine

## 2010-01-15 ENCOUNTER — Encounter: Payer: Self-pay | Admitting: Internal Medicine

## 2010-01-15 DIAGNOSIS — C50412 Malignant neoplasm of upper-outer quadrant of left female breast: Secondary | ICD-10-CM | POA: Insufficient documentation

## 2010-01-15 HISTORY — DX: Malignant neoplasm of upper-outer quadrant of left female breast: C50.412

## 2010-01-16 ENCOUNTER — Encounter: Admission: RE | Admit: 2010-01-16 | Discharge: 2010-01-16 | Payer: Self-pay | Admitting: Internal Medicine

## 2010-01-27 ENCOUNTER — Ambulatory Visit: Payer: Self-pay | Admitting: Internal Medicine

## 2010-01-27 ENCOUNTER — Telehealth: Payer: Self-pay | Admitting: Internal Medicine

## 2010-02-04 ENCOUNTER — Encounter (INDEPENDENT_AMBULATORY_CARE_PROVIDER_SITE_OTHER): Payer: Self-pay | Admitting: General Surgery

## 2010-02-04 ENCOUNTER — Ambulatory Visit (HOSPITAL_COMMUNITY): Admission: RE | Admit: 2010-02-04 | Discharge: 2010-02-05 | Payer: Self-pay | Admitting: General Surgery

## 2010-02-21 ENCOUNTER — Ambulatory Visit: Payer: Self-pay | Admitting: Internal Medicine

## 2010-02-21 DIAGNOSIS — K644 Residual hemorrhoidal skin tags: Secondary | ICD-10-CM | POA: Insufficient documentation

## 2010-02-24 ENCOUNTER — Ambulatory Visit (HOSPITAL_COMMUNITY)
Admission: RE | Admit: 2010-02-24 | Discharge: 2010-02-24 | Payer: Self-pay | Source: Home / Self Care | Admitting: Internal Medicine

## 2010-02-24 ENCOUNTER — Telehealth: Payer: Self-pay | Admitting: *Deleted

## 2010-02-24 ENCOUNTER — Ambulatory Visit: Payer: Self-pay | Admitting: Internal Medicine

## 2010-02-24 ENCOUNTER — Observation Stay (HOSPITAL_COMMUNITY)
Admission: AD | Admit: 2010-02-24 | Discharge: 2010-02-26 | Payer: Self-pay | Attending: Internal Medicine | Admitting: Internal Medicine

## 2010-02-24 LAB — CONVERTED CEMR LAB
Bilirubin Urine: NEGATIVE
Blood in Urine, dipstick: NEGATIVE
Glucose, Urine, Semiquant: NEGATIVE
HCT: 34.8 % — ABNORMAL LOW (ref 36.0–46.0)
Hemoglobin: 11.5 g/dL — ABNORMAL LOW (ref 12.0–15.0)
MCHC: 33 g/dL (ref 30.0–36.0)
MCV: 82.9 fL (ref >=78.0)
Nitrite: POSITIVE
Platelets: 327 10*3/uL (ref 150–400)
Protein, U semiquant: NEGATIVE
RBC: 4.2 M/uL (ref 3.87–5.11)
RDW: 13.1 % (ref 11.5–15.5)
Specific Gravity, Urine: 1.015
Urobilinogen, UA: 0.2
WBC: 9.4 10*3/uL (ref 4.0–10.5)
pH: 5

## 2010-02-26 ENCOUNTER — Ambulatory Visit: Payer: Self-pay | Admitting: Oncology

## 2010-02-26 ENCOUNTER — Encounter: Payer: Self-pay | Admitting: Internal Medicine

## 2010-02-27 ENCOUNTER — Encounter: Payer: Self-pay | Admitting: Internal Medicine

## 2010-02-27 LAB — CBC WITH DIFFERENTIAL/PLATELET
BASO%: 0.5 % (ref 0.0–2.0)
Basophils Absolute: 0 10*3/uL (ref 0.0–0.1)
EOS%: 3.7 % (ref 0.0–7.0)
Eosinophils Absolute: 0.2 10*3/uL (ref 0.0–0.5)
HCT: 28.7 % — ABNORMAL LOW (ref 34.8–46.6)
HGB: 9.6 g/dL — ABNORMAL LOW (ref 11.6–15.9)
LYMPH%: 38.9 % (ref 14.0–49.7)
MCH: 27 pg (ref 25.1–34.0)
MCHC: 33.4 g/dL (ref 31.5–36.0)
MCV: 80.6 fL (ref 79.5–101.0)
MONO#: 0.4 10*3/uL (ref 0.1–0.9)
MONO%: 5.7 % (ref 0.0–14.0)
NEUT#: 3.2 10*3/uL (ref 1.5–6.5)
NEUT%: 51.2 % (ref 38.4–76.8)
Platelets: 251 10*3/uL (ref 145–400)
RBC: 3.56 10*6/uL — ABNORMAL LOW (ref 3.70–5.45)
RDW: 13.1 % (ref 11.2–14.5)
WBC: 6.3 10*3/uL (ref 3.9–10.3)
lymph#: 2.4 10*3/uL (ref 0.9–3.3)

## 2010-02-27 LAB — COMPREHENSIVE METABOLIC PANEL
ALT: 16 U/L (ref 0–35)
AST: 22 U/L (ref 0–37)
Albumin: 4 g/dL (ref 3.5–5.2)
Alkaline Phosphatase: 63 U/L (ref 39–117)
BUN: 17 mg/dL (ref 6–23)
CO2: 24 mEq/L (ref 19–32)
Calcium: 9.1 mg/dL (ref 8.4–10.5)
Chloride: 107 mEq/L (ref 96–112)
Creatinine, Ser: 1.42 mg/dL — ABNORMAL HIGH (ref 0.40–1.20)
Glucose, Bld: 87 mg/dL (ref 70–99)
Potassium: 4.4 mEq/L (ref 3.5–5.3)
Sodium: 143 mEq/L (ref 135–145)
Total Bilirubin: 0.4 mg/dL (ref 0.3–1.2)
Total Protein: 6.5 g/dL (ref 6.0–8.3)

## 2010-03-04 ENCOUNTER — Encounter: Payer: Self-pay | Admitting: Internal Medicine

## 2010-03-05 ENCOUNTER — Encounter: Payer: Self-pay | Admitting: Internal Medicine

## 2010-03-06 ENCOUNTER — Ambulatory Visit: Payer: Self-pay | Admitting: Internal Medicine

## 2010-03-20 ENCOUNTER — Emergency Department (HOSPITAL_COMMUNITY)
Admission: EM | Admit: 2010-03-20 | Discharge: 2010-03-21 | Payer: Self-pay | Source: Home / Self Care | Admitting: Emergency Medicine

## 2010-03-25 ENCOUNTER — Ambulatory Visit: Admit: 2010-03-25 | Payer: Self-pay

## 2010-03-27 LAB — CBC WITH DIFFERENTIAL/PLATELET
BASO%: 0.6 % (ref 0.0–2.0)
Basophils Absolute: 0.1 10*3/uL (ref 0.0–0.1)
EOS%: 3.4 % (ref 0.0–7.0)
Eosinophils Absolute: 0.3 10*3/uL (ref 0.0–0.5)
HCT: 34.2 % — ABNORMAL LOW (ref 34.8–46.6)
HGB: 11.2 g/dL — ABNORMAL LOW (ref 11.6–15.9)
LYMPH%: 47.1 % (ref 14.0–49.7)
MCH: 26.3 pg (ref 25.1–34.0)
MCHC: 32.7 g/dL (ref 31.5–36.0)
MCV: 80.3 fL (ref 79.5–101.0)
MONO#: 0.6 10*3/uL (ref 0.1–0.9)
MONO%: 6.8 % (ref 0.0–14.0)
NEUT#: 4 10*3/uL (ref 1.5–6.5)
NEUT%: 42.1 % (ref 38.4–76.8)
Platelets: 218 10*3/uL (ref 145–400)
RBC: 4.26 10*6/uL (ref 3.70–5.45)
RDW: 13.8 % (ref 11.2–14.5)
WBC: 9.4 10*3/uL (ref 3.9–10.3)
lymph#: 4.5 10*3/uL — ABNORMAL HIGH (ref 0.9–3.3)
nRBC: 0 % (ref 0–0)

## 2010-03-27 LAB — COMPREHENSIVE METABOLIC PANEL
ALT: 11 U/L (ref 0–35)
AST: 12 U/L (ref 0–37)
Albumin: 4.2 g/dL (ref 3.5–5.2)
Alkaline Phosphatase: 85 U/L (ref 39–117)
BUN: 26 mg/dL — ABNORMAL HIGH (ref 6–23)
CO2: 29 mEq/L (ref 19–32)
Calcium: 9.7 mg/dL (ref 8.4–10.5)
Chloride: 104 mEq/L (ref 96–112)
Creatinine, Ser: 1.39 mg/dL — ABNORMAL HIGH (ref 0.40–1.20)
Glucose, Bld: 100 mg/dL — ABNORMAL HIGH (ref 70–99)
Potassium: 4 mEq/L (ref 3.5–5.3)
Sodium: 143 mEq/L (ref 135–145)
Total Bilirubin: 0.4 mg/dL (ref 0.3–1.2)
Total Protein: 7 g/dL (ref 6.0–8.3)

## 2010-03-28 ENCOUNTER — Ambulatory Visit (HOSPITAL_BASED_OUTPATIENT_CLINIC_OR_DEPARTMENT_OTHER): Payer: Medicare Other | Admitting: Oncology

## 2010-04-01 ENCOUNTER — Ambulatory Visit
Admission: RE | Admit: 2010-04-01 | Discharge: 2010-04-01 | Payer: Self-pay | Source: Home / Self Care | Attending: General Surgery | Admitting: General Surgery

## 2010-04-07 LAB — BASIC METABOLIC PANEL
BUN: 15 mg/dL (ref 6–23)
CO2: 27 mEq/L (ref 19–32)
Calcium: 9.9 mg/dL (ref 8.4–10.5)
Chloride: 105 mEq/L (ref 96–112)
Creatinine, Ser: 1.52 mg/dL — ABNORMAL HIGH (ref 0.4–1.2)
GFR calc Af Amer: 42 mL/min — ABNORMAL LOW (ref >60)
GFR calc non Af Amer: 35 mL/min — ABNORMAL LOW (ref >60)
Glucose, Bld: 114 mg/dL — ABNORMAL HIGH (ref 70–99)
Potassium: 4.3 mEq/L (ref 3.5–5.1)
Sodium: 143 mEq/L (ref 135–145)

## 2010-04-07 LAB — POCT HEMOGLOBIN-HEMACUE: Hemoglobin: 12.5 g/dL (ref 12.0–15.0)

## 2010-04-08 ENCOUNTER — Ambulatory Visit: Admit: 2010-04-08 | Payer: Self-pay | Admitting: Oncology

## 2010-04-10 ENCOUNTER — Ambulatory Visit
Admission: RE | Admit: 2010-04-10 | Discharge: 2010-04-10 | Payer: Self-pay | Source: Home / Self Care | Attending: Oncology | Admitting: Oncology

## 2010-04-10 LAB — COMPREHENSIVE METABOLIC PANEL
ALT: 13 U/L (ref 0–35)
AST: 14 U/L (ref 0–37)
Albumin: 4.6 g/dL (ref 3.5–5.2)
Alkaline Phosphatase: 78 U/L (ref 39–117)
BUN: 22 mg/dL (ref 6–23)
CO2: 26 mEq/L (ref 19–32)
Calcium: 10 mg/dL (ref 8.4–10.5)
Chloride: 103 mEq/L (ref 96–112)
Creatinine, Ser: 1.43 mg/dL — ABNORMAL HIGH (ref 0.40–1.20)
Glucose, Bld: 124 mg/dL — ABNORMAL HIGH (ref 70–99)
Potassium: 4.2 mEq/L (ref 3.5–5.3)
Sodium: 140 mEq/L (ref 135–145)
Total Bilirubin: 0.5 mg/dL (ref 0.3–1.2)
Total Protein: 7.5 g/dL (ref 6.0–8.3)

## 2010-04-10 LAB — CBC WITH DIFFERENTIAL/PLATELET
BASO%: 0.9 % (ref 0.0–2.0)
Basophils Absolute: 0.1 10*3/uL (ref 0.0–0.1)
EOS%: 8.7 % — ABNORMAL HIGH (ref 0.0–7.0)
Eosinophils Absolute: 0.5 10*3/uL (ref 0.0–0.5)
HCT: 35.2 % (ref 34.8–46.6)
HGB: 11.5 g/dL — ABNORMAL LOW (ref 11.6–15.9)
LYMPH%: 44.6 % (ref 14.0–49.7)
MCH: 25.9 pg (ref 25.1–34.0)
MCHC: 32.7 g/dL (ref 31.5–36.0)
MCV: 79.3 fL — ABNORMAL LOW (ref 79.5–101.0)
MONO#: 0.2 10*3/uL (ref 0.1–0.9)
MONO%: 4.2 % (ref 0.0–14.0)
NEUT#: 2.2 10*3/uL (ref 1.5–6.5)
NEUT%: 41.6 % (ref 38.4–76.8)
Platelets: 199 10*3/uL (ref 145–400)
RBC: 4.44 10*6/uL (ref 3.70–5.45)
RDW: 13.6 % (ref 11.2–14.5)
WBC: 5.3 10*3/uL (ref 3.9–10.3)
lymph#: 2.4 10*3/uL (ref 0.9–3.3)

## 2010-04-13 ENCOUNTER — Encounter: Payer: Self-pay | Admitting: Internal Medicine

## 2010-04-16 LAB — CBC WITH DIFFERENTIAL/PLATELET
BASO%: 0.6 % (ref 0.0–2.0)
Basophils Absolute: 0 10*3/uL (ref 0.0–0.1)
EOS%: 5.5 % (ref 0.0–7.0)
Eosinophils Absolute: 0.2 10*3/uL (ref 0.0–0.5)
HCT: 30.6 % — ABNORMAL LOW (ref 34.8–46.6)
HGB: 10.4 g/dL — ABNORMAL LOW (ref 11.6–15.9)
LYMPH%: 51.6 % — ABNORMAL HIGH (ref 14.0–49.7)
MCH: 27.2 pg (ref 25.1–34.0)
MCHC: 34 g/dL (ref 31.5–36.0)
MCV: 79.9 fL (ref 79.5–101.0)
MONO#: 0.1 10*3/uL (ref 0.1–0.9)
MONO%: 4.1 % (ref 0.0–14.0)
NEUT#: 1.2 10*3/uL — ABNORMAL LOW (ref 1.5–6.5)
NEUT%: 38.2 % — ABNORMAL LOW (ref 38.4–76.8)
Platelets: 239 10*3/uL (ref 145–400)
RBC: 3.82 10*6/uL (ref 3.70–5.45)
RDW: 14.5 % (ref 11.2–14.5)
WBC: 3.2 10*3/uL — ABNORMAL LOW (ref 3.9–10.3)
lymph#: 1.6 10*3/uL (ref 0.9–3.3)

## 2010-04-16 LAB — BASIC METABOLIC PANEL
BUN: 25 mg/dL — ABNORMAL HIGH (ref 6–23)
CO2: 26 mEq/L (ref 19–32)
Calcium: 9.3 mg/dL (ref 8.4–10.5)
Chloride: 103 mEq/L (ref 96–112)
Creatinine, Ser: 1.39 mg/dL — ABNORMAL HIGH (ref 0.40–1.20)
Glucose, Bld: 90 mg/dL (ref 70–99)
Potassium: 4.2 mEq/L (ref 3.5–5.3)
Sodium: 140 mEq/L (ref 135–145)

## 2010-04-22 NOTE — Miscellaneous (Signed)
Summary: needs lipid check  Clinical Lists Changes  Orders: Added new Test order of T-Lipid Profile [phone]) - Signed

## 2010-04-22 NOTE — Assessment & Plan Note (Signed)
Summary: FU OV/VS   Vital Signs:  Patient Profile:   60 Years Old Female Height:     68 inches (172.72 cm) Weight:      229.2 pounds (104.18 kg) BMI:     34.98 Temp:     98.2 degrees F (36.78 degrees C) oral Pulse rate:   94 / minute BP sitting:   146 / 85  (right arm)  Pt. in pain?   yes    Location:   lower right abd    Intensity:   6  Vitals Entered By: Hilda Blades Ditzler RN (October 26, 2006 3:11 PM)              Is Patient Diabetic? No Nutritional Status BMI of > 30 = obese Nutritional Status Detail good  Have you ever been in a relationship where you felt threatened, hurt or afraid?denies   Does patient need assistance? Functional Status Self care Ambulation Impaired:Risk for fall Comments Uses a cane.   Chief Complaint:  FU on pain- sl better.Marland Kitchen  History of Present Illness: Pt is a 60 yo woman with a hx of obesity, htn, hl, chronic back pain which I saw last week for pelvic pain. Since I last saw her, her pain has improved and has decreased to a 6/10. It has mostly localized to her R iliac fossa w/o radiation. Pt still denies fevers, chills, N/V/dysphagia/odynophagia/heartburn, diarrhea, constipation, rectal bleeding and melena. Appetite good. No other complaints. Took Mylanta about twice over the weekend.   Current Allergies (reviewed today): No known allergies     Risk Factors: Tobacco use:  never Drug use:  no Alcohol use:  no Seatbelt use:  100 %  Colonoscopy History:    Date of Last Colonoscopy:  12/22/2002  Mammogram History:    Date of Last Mammogram:  02/10/2006   Review of Systems      See HPI   Physical Exam  General:     alert, well-developed, well-nourished, and well-hydrated.   Head:     atraumatic.   Mouth:     OP clear, MMM Lungs:     CTAB Heart:     RRR Abdomen:     BS+, soft, TTP of RLQ with mild voluntary guarding. Rebound tenderness +. Murphy-. Pulses:     1+ posterior tibial. Extremities:     2+ mildly pitting feet  and ankle edema. Neurologic:     alert & oriented X3 and cranial nerves II-XII intact.  Walks with a cane 2/2 low back pain.    Impression & Recommendations:  Problem # 1:  PELVIC  PAIN (ICD-789.09) Pt's pain not significantly improved. Her labwork last week revealed normal LFTs, normal electrolytes. Her creatinine was mildly elevated (at baseline: 1.26) and her lipase was in the 80 range. I am not sure how to interpret her elevated lipase especially in the light of the fact that she has no other sx than RLQ pain.  Since she had an elective appendectomy while getting her TAH-BSO in the past, I am not worried for appendicitis or ovarian pathologies. In addition, the RLQ would be a surprising place to have diverticulitis. This could certainly be the result of abdominal adhesions but I wonder what the trigger would be to make this pain appear. I will get an abd-pelvic CT scan with contrast to r/o above mentioned entities. If scan completely normal, will suspect that adhesions are the etiology. Radiologist to contact me with results.  Her updated medication list for this problem includes:  Flexeril 10 Mg Tab (Cyclobenzaprine hcl) .Marland Kitchen... Take one tablet by mouth three times a day    Tramadol Hcl 50 Mg Tabs (Tramadol hcl) ..... One pill every 6 hours as needed for pain  Orders: CT with Contrast (CT w/ contrast)   Problem # 2:  RENAL INSUFFICIENCY, MILD (ICD-588.9) Since she is to receive CM, I instructed pt of the risks given her renal insufficiency. I instructed her to hydrate herself well for the days preceding her CT scan and afterwards as well. I will need to get a f/u BMET after her scan to monitor her renal fct.  Complete Medication List: 1)  Pravachol 10 Mg Tabs (Pravastatin sodium) 2)  Flexeril 10 Mg Tab (Cyclobenzaprine hcl) .... Take one tablet by mouth three times a day 3)  Norvasc 10 Mg Tabs (Amlodipine besylate) .... Take 1 tablet by mouth once a day 4)  Lisinopril 10 Mg Tabs  (Lisinopril) .... Take 1 tablet by mouth once a day 5)  Tramadol Hcl 50 Mg Tabs (Tramadol hcl) .... One pill every 6 hours as needed for pain 6)  Lasix 40 Mg Tabs (Furosemide) .... Once daily tab in am 7)  Kay Ciel 20 Meq Pack (Potassium chloride) .... 2 pills daily  with food 8)  Pravachol 20 Mg Tabs (Pravastatin sodium) .... Take 1 tablet by mouth at bedtime   Patient Instructions: 1)  I will get called with the results of the CT scan after which time I will call you. I will have you come back to the clinic for labwork after your CT scan to monitor your kidneys.

## 2010-04-22 NOTE — Assessment & Plan Note (Signed)
Summary: acute abd pain/doublebook per Dr. Joines/(bryant)/dms   Vital Signs:  Patient Profile:   60 Years Old Female Height:     68 inches (172.72 cm) Weight:      232.8 pounds (105.82 kg) BMI:     35.53 Temp:     99.3 degrees F (37.39 degrees C) oral Pulse rate:   106 / minute BP sitting:   172 / 95  (right arm)  Pt. in pain?   yes    Location:   lower abd    Intensity:   8              Is Patient Diabetic? No Nutritional Status BMI of > 30 = obese Nutritional Status Detail good  Have you ever been in a relationship where you felt threatened, hurt or afraid?denies   Does patient need assistance? Functional Status Self care Ambulation Impaired:Risk for fall Comments Uses a cane.   Chief Complaint:  Right and left lower abd pain off and on fo years- hyst..  History of Present Illness: Pt is a [name] with obesity, htn, dm-II, chronic back pain s/p surgery.  Pt's brother and sister-in-law have temporarily moved in with her. Brother s/p msk surgeries. Sister-in-law has emphysema and is on chronic O2. The arrangement was made so that they could keep an eye on each other. Has been more stressed out lately because of this. Her major complaint today is intermittent abdominal pain. Has had it more severely for 3 days but has had it on-off for years (since hysterectomy 27 yrs ago). Pressure sensation that started at 2/10 and progressed to 8/10 over the past 3 days. Deep in the L iliac fossa and travels to the R iliac fossa as well. Pain has been cst but not at same intensity during this time.  Denies fevers and chills. No other abdominal complaints: appetite good, no dysphagia/odynophagia, no heartburn, no nausea or vomiting, no other abd. pain, no diarrhea or constipation (daily brown formed stool). Denies dysuria, hematuria and urinary frequency. No hx of kidney stones. Had TAH-BSO and appendectomy.  Current Allergies (reviewed today): No known allergies   Past Surgical  History:    Hysterectomy for fibroids    Cervical laminectomy c5-c6, c4-c5 left  nitka 2005    Oophorectomy    Risk Factors: Tobacco use:  never Drug use:  no Alcohol use:  no Seatbelt use:  100 %  Colonoscopy History:    Date of Last Colonoscopy:  12/22/2002  Mammogram History:    Date of Last Mammogram:  02/10/2006   Review of Systems      See HPI   Physical Exam  General:     alert, well-developed, well-nourished, and well-hydrated.   Head:     normocephalic and atraumatic.  Eyes:     PERRLA, anicteric. Mouth:     OP clear, MMM, petechiae on sides of tongue (pt admits to brushing her tongue every day). Neck:     Supple, no lymphADNP/TM Lungs:     CTAB with good air mvt Heart:     RRR, no m/r/g Abdomen:     Obese, BS decreased, pale striae, lower midline scar, soft, TTP of RLQ  Pulses:     Peripheral pulses 2+ bilaterally. Extremities:     Trace bilateral edema. Neurologic:     alert & oriented X3, cranial nerves II-XII intact, strength normal in all extremities, and gait normal.      Impression & Recommendations:  Problem # 1:  PELVIC  PAIN (ICD-789.09) Acute on chronic. This episode more severe. 2 normal abd. CT scans over the last year so I do not feel compelled to repeat one at this time especially since she does not have an acute abdomen. Will check a CMET to eval. renal fct and LFTs, a UA to r/o an atypical presentation for UTI, lipase to r/o atypical presentation for pancreatitis and a CBC to r/o leukocytosis and/or L shift. Will f/u early next week and if sx have not improved or progressed, will then consider imaging.  Her updated medication list for this problem includes:    Flexeril 10 Mg Tab (Cyclobenzaprine hcl) .Marland Kitchen... Take one tablet by mouth three times a day    Tramadol Hcl 50 Mg Tabs (Tramadol hcl) ..... One pill every 6 hours as needed for pain  Orders: T-Comprehensive Metabolic Panel ([id number]) T-CBC w/Diff  [id number]) T-Urinalysis Dipstick only [id number]) T-Lipase [id number])   Complete Medication List: 1)  Pravachol 10 Mg Tabs (Pravastatin sodium) 2)  Flexeril 10 Mg Tab (Cyclobenzaprine hcl) .... Take one tablet by mouth three times a day 3)  Norvasc 10 Mg Tabs (Amlodipine besylate) .... Take 1 tablet by mouth once a day 4)  Lisinopril 10 Mg Tabs (Lisinopril) .... Take 1 tablet by mouth once a day 5)  Tramadol Hcl 50 Mg Tabs (Tramadol hcl) .... One pill every 6 hours as needed for pain 6)  Lasix 40 Mg Tabs (Furosemide) .... Once daily tab in am 7)  Kay Ciel 20 Meq Pack (Potassium chloride) .... 2 pills daily  with food 8)  Pravachol 20 Mg Tabs (Pravastatin sodium) .... Take 1 tablet by mouth at bedtime   Patient Instructions: 1)  Keep your appointment on Tuesday (I will trade with Dr. Olevia Bowens to see you). 2)  I will call you this evening if there is anything wrong with your labwork. 3)  If your pain increases over the weekend or if you start vomiting or having blood in your stools, go to the emergency department.         Laboratory Results   Urine Tests  Date/Time Recieved: October 22, 2006 3:46 PM  Date/Time Reported: ..................................................................Marland KitchenMelvia Heaps  October 22, 2006 3:46 PM   Routine Urinalysis   Color: yellow Appearance: Clear Glucose: negative   (Normal Range: Negative) Bilirubin: negative   (Normal Range: Negative) Ketone: negative   (Normal Range: Negative) Spec. Gravity: 1.015   (Normal Range: 1.003-1.035) Blood: negative   (Normal Range: Negative) pH: 5.0   (Normal Range: 5.0-8.0) Protein: negative   (Normal Range: Negative) Urobilinogen: 0.2   (Normal Range: 0-1) Nitrite: negative   (Normal Range: Negative) Leukocyte Esterace: negative   (Normal Range: Negative)

## 2010-04-22 NOTE — Progress Notes (Signed)
Summary: lu shot/gp  Phone Note Call from Patient   Summary of Call: Received fax from [place]; pt. had flu shot today 12/30/09. Initial call taken by: Morrison Old RN,  December 30, 2009 3:40 PM      Influenza Immunization History:    Influenza # 1:  Historical (12/30/2009)

## 2010-04-22 NOTE — Assessment & Plan Note (Signed)
Summary: ACUTE-CONGESTION ADD PER [facility](DEVANI)   Vital Signs:  Patient profile:   60 year old female Height:      66.5 inches Weight:      231.1 pounds BMI:     36.87 Temp:     97.7 degrees F oral Pulse rate:   84 / minute BP sitting:   151 / 91  (right arm)  Vitals Entered By: Silverio Decamp NT II (January 27, 2010 9:56 AM) CC: [facility] CONJESTION Is Patient Diabetic? No  Have you ever been in a relationship where you felt threatened, hurt or afraid?No   Does patient need assistance? Functional Status Self care Ambulation Normal   Primary Care Evvie Behrmann:  Lester Edgewood MD  CC:  [facility] [place].  History of Present Illness: 60 y/o woman with PMH significant for newly diagnosed breast cancer comes into the clinic today for common cold that started 2 weeks ago. She describes her symptoms as feeling congested or stuffing, accompaied by sneezing, watering from her eyes, cough, sore throat. But she denies fever,chills, malaise, N/V/D.  She has tried a lot of OTC medicines and normal saline drops which is not helping her. Her inhalers incl advair and ventolin are also not helping her.  She has surgery on 11/15 for her breast cancer and was concerned about what medication should she take, that do not interfer with her surgery.  Preventive Screening-Counseling & Management  Alcohol-Tobacco     Alcohol drinks/day: 0     Smoking Status: never     Passive Smoke Exposure: no  Caffeine-Diet-Exercise     Does Patient Exercise: no  Current Medications (verified): 1)  Norvasc 10 Mg Tabs (Amlodipine Besylate) .... Take 1 Tablet By Mouth Once A Day 2)  Lisinopril 40 Mg  Tabs (Lisinopril) .... Take 1 Tablet By Mouth Once A Day 3)  Hydrochlorothiazide 25 Mg  Tabs (Hydrochlorothiazide) .... Take 1 Tablet By Mouth Once A Day 4)  Albuterol 90 Mcg/act  Aers (Albuterol) .... Take 2 Puffs Every 4 Hours As Needed 5)  Advair Diskus 100-50 Mcg/dose Misc (Fluticasone-Salmeterol) .... Take 1 Puff  Twice Daily 6)  Ativan 1 Mg Tabs (Lorazepam) .... Take One Tablet As Needed For Panic Attack. 7)  Pantoprazole Sodium 40 Mg Tbec (Pantoprazole Sodium) .... Take 1 Tablet By Mouth Once A Day 8)  Tramadol Hcl 50 Mg Tabs (Tramadol Hcl) .Marland Kitchen.. 1 Tablet Every 6 Hrs As Needed For Pain 9)  Hydrocod Polst-Chlorphen Polst 10-8 Mg/43ml Lqcr (Hydrocod Polst-Chlorphen Polst) .... Take 1 Teaspoon Two Times A Day 10)  Zithromax 1 Gm Pack (Azithromycin) .... Take 500 Mg On First Day Followed By 250 Mg For 4 Days.  Allergies (verified): 1)  ! Crestor 2)  Lipitor  Review of Systems      See HPI  The patient denies fever, chest pain, dyspnea on exertion, and headaches.    Physical Exam  General:  alert, well-developed, well-nourished, and well-hydrated.   Head:  normocephalic and atraumatic.   Eyes:  pupils equal, pupils round, pupils reactive to light, and pupils react to accomodation.   Neck:  supple and full ROM.   Lungs:  normal respiratory effort, no intercostal retractions, no accessory muscle use, normal breath sounds, no dullness, no fremitus, no crackles, and no wheezes.   Heart:  normal rate, regular rhythm, no murmur, no gallop, no rub, and no JVD.   Abdomen:  soft, non-tender, normal bowel sounds, no distention, and no masses.   Msk:  normal ROM, no joint tenderness,  and no joint swelling.   Pulses:  2+pulses b/l Extremities:  no edema, no clubbing Neurologic:  alert & oriented X3, cranial nerves II-XII intact, strength normal in all extremities, sensation intact to light touch, gait normal, and DTRs symmetrical and normal.     Impression & Recommendations:  Problem # 1:  U R I (ICD-465.9) Assessment New She reports feeling stuffy or congested accompained by sneezing,watering from her eyes, cough, sore throat that has been ongoing for 2 weeks. She has tried OTC NS drops , staem inhalation but these rx are not helping her.Considering the duration of her symptoms we would treat her for  bacterial URI. We started her on Z- pak and asked her to call the clinic if  her s/s does not improve. Encouraged her to continue with steam inhalation and warm saline gargles. Her updated medication list for this problem includes:    Hydrocod Polst-chlorphen Polst 10-8 Mg/5ml Lqcr (Hydrocod polst-chlorphen polst) .Marland Kitchen... Take 1 teaspoon two times a day  Problem # 2:  HYPERTENSION (ICD-401.9) Assessment: Unchanged Her BP was high(151/91) when she first presented to the clinic, that could be exertional. A repeat BP reading was 130/90.She also reported that she has not taken her morning dose. Her updated medication list for this problem includes:    Norvasc 10 Mg Tabs (Amlodipine besylate) .Marland Kitchen... Take 1 tablet by mouth once a day    Lisinopril 40 Mg Tabs (Lisinopril) .Marland Kitchen... Take 1 tablet by mouth once a day    Hydrochlorothiazide 25 Mg Tabs (Hydrochlorothiazide) .Marland Kitchen... Take 1 tablet by mouth once a day  Complete Medication List: 1)  Norvasc 10 Mg Tabs (Amlodipine besylate) .... Take 1 tablet by mouth once a day 2)  Lisinopril 40 Mg Tabs (Lisinopril) .... Take 1 tablet by mouth once a day 3)  Hydrochlorothiazide 25 Mg Tabs (Hydrochlorothiazide) .... Take 1 tablet by mouth once a day 4)  Albuterol 90 Mcg/act Aers (Albuterol) .... Take 2 puffs every 4 hours as needed 5)  Advair Diskus 100-50 Mcg/dose Misc (Fluticasone-salmeterol) .... Take 1 puff twice daily 6)  Ativan 1 Mg Tabs (Lorazepam) .... Take one tablet as needed for panic attack. 7)  Pantoprazole Sodium 40 Mg Tbec (Pantoprazole sodium) .... Take 1 tablet by mouth once a day 8)  Tramadol Hcl 50 Mg Tabs (Tramadol hcl) .Marland Kitchen.. 1 tablet every 6 hrs as needed for pain 9)  Hydrocod Polst-chlorphen Polst 10-8 Mg/30ml Lqcr (Hydrocod polst-chlorphen polst) .... Take 1 teaspoon two times a day 10)  Zithromax 1 Gm Pack (Azithromycin) .... Take 500 mg on first day followed by 250 mg for 4 days.  Patient Instructions: 1)  Pleasetake your medicines as  prescribed. 2)  Please schedule a follow-up appointment in 2 months. 3)  Please call the clinic if your symptoms get worse. 4)  Please continue with the consevative measures which include warm saline gargles and steam inhalation. Prescriptions: ZITHROMAX 1 GM PACK (AZITHROMYCIN) take 500 mg on first day followed by 250 mg for 4 days.  #1 x 0   Entered and Authorized by:   Pedro Earls   Signed by:   Pedro Earls on 01/27/2010   Method used:   Print then Give to Patient   RxIDZY:[id number] HYDROCOD POLST-CHLORPHEN POLST 10-8 MG/5ML LQCR (HYDROCOD POLST-CHLORPHEN POLST) take 1 teaspoon two times a day  #1 x 0   Entered and Authorized by:   Pedro Earls   Signed by:   Pedro Earls on 01/27/2010   Method used:  Print then Give to Patient   RxID:   [id number]    Orders Added: 1)  Est. Patient Level III [id number]    Prevention & Chronic Care Immunizations   Influenza vaccine: Historical  (12/30/2009)   Influenza vaccine due: 11/22/2010    Tetanus booster: 02/20/2002: Historical   Tetanus booster due: 02/21/2012    Pneumococcal vaccine: Historical  (02/21/2004)   Pneumococcal vaccine due: 02/20/2009    H. zoster vaccine: Not documented   H. zoster vaccine deferral: Refused  (10/15/2009)  Colorectal Screening   Hemoccult: Negative  (03/03/2005)   Hemoccult action/deferral: Not indicated  (10/10/2008)    Colonoscopy: Normal  (12/22/2002)   Colonoscopy action/deferral: scheduled with G.I.  (05/04/2006)   Colonoscopy due: 12/21/2012  Other Screening   Pap smear: Not documented   Pap smear action/deferral: Not indicated S/P hysterectomy  (10/10/2008)    Mammogram: BI-RADS CATEGORY 1:  Negative.^MM DIGITAL SCREENING UNILAT R  (01/14/2010)   Mammogram due: 03/12/2010    DXA bone density scan: Not documented   Smoking status: never  (01/27/2010)  Lipids   Total Cholesterol: 258  (04/26/2009)   Lipid panel action/deferral: Lipid Panel ordered   LDL: 177   (04/26/2009)   LDL Direct: Not documented   HDL: 42  (04/26/2009)   Triglycerides: 193  (04/26/2009)    SGOT (AST): 18  (04/26/2009)   BMP action: Ordered   SGPT (ALT): 17  (04/26/2009)   Alkaline phosphatase: 58  (04/26/2009)   Total bilirubin: 0.5  (04/26/2009)  Hypertension   Last Blood Pressure: 151 / 91  (01/27/2010)   Serum creatinine: 1.17  (04/26/2009)   BMP action: Ordered   Serum potassium 3.9  (04/26/2009)    Hypertension flowsheet reviewed?: Yes   Progress toward BP goal: Deteriorated  Self-Management Support :   Personal Goals (by the next clinic visit) :      Personal blood pressure goal: 140/90  (04/26/2009)     Personal LDL goal: 100  (04/26/2009)    Patient will work on the following items until the next clinic visit to reach self-care goals:     Medications and monitoring: take my medicines every day  (01/27/2010)     Eating: drink diet soda or water instead of juice or soda, eat more vegetables, eat foods that are low in salt, eat baked foods instead of fried foods  (01/27/2010)     Activity: take a 30 minute walk every day  (01/27/2010)    Hypertension self-management support: Written self-care plan, Education handout, Pre-printed educational material, Resources for patients handout  (10/15/2009)    Lipid self-management support: Written self-care plan, Education handout, Pre-printed educational material, Resources for patients handout  (10/15/2009)

## 2010-04-22 NOTE — Assessment & Plan Note (Signed)
Summary: CHECKUP/ SB.   Vital Signs:  Patient Profile:   60 Years Old Female Weight:      233.8 pounds Temp:     99.3 degrees F Pulse rate:   95 / minute BP sitting:   152 / 90  (left arm)  Pt. in pain?   yes    Location:   lower back    Intensity:   8    Type:       heaviness/sharp  Vitals Entered By: Lucky Rathke (May 19, 2006 3:18 PM)              Is Patient Diabetic? No Nutritional Status Obese  Does patient need assistance? Functional Status Self care Ambulation Normal   Chief Complaint:  medication refill/ pap smear / vaginal itching / productive cough-thick yellow sputum/stuffiness in head at night.pelvic pain/hyst.  History of Present Illness: Several issues she needs to address today:  1) Back pain, unchanged.  sleepy on the vicodin. 2 visits to PT no help thus far. 2) Sinuses draining thick mucous, productive cough, no fevers, some congestion. 3) Lower abdominal pain, sometimes on left, sometimes on right.  vague symptoms, can't describe it, can't relate it to voiding or stooling, no nauseau or diarrhea.  happens about every other day. 4) Vaginal itching 2 days ago, no discharge, itching resolved.  uses dial soap in shower.  no sexual partners. s/p total hysterectomy.  Prior Medications: PRAVACHOL 10 MG TABS (PRAVASTATIN SODIUM)  FLEXERIL 10 MG TAB (CYCLOBENZAPRINE HCL) Take one tablet by mouth three times a day NORVASC 10 MG TABS (AMLODIPINE BESYLATE) Take 1 tablet by mouth once a day HYDROCHLOROTHIAZIDE 25 MG TAB (HYDROCHLOROTHIAZIDE) Take 1 tablet by mouth once a day LISINOPRIL 10 MG TABS (LISINOPRIL) Take 1 tablet by mouth once a day Current Allergies: No known allergies     Risk Factors:  Seatbelt use:  100 %    Physical Exam  General:     Well-developed,well-nourished,in no acute distress; alert,appropriate and cooperative throughout examination Nose:     nasal dischargemucosal pallor, mucosal erythema, and mucosal edema.    Mouth:     Oral mucosa and oropharynx without lesions or exudates.  Teeth in good repair. Neck:     No deformities, masses, or tenderness noted. Lungs:     Normal respiratory effort, chest expands symmetrically. Lungs are clear to auscultation, no crackles or wheezes. Heart:     Normal rate and regular rhythm. S1 and S2 normal without gallop, murmur, click, rub or other extra sounds. Abdomen:     Bowel sounds positive,abdomen soft and non-tender without masses, organomegaly or hernias noted. Genitalia:     normal introitus, no external lesions, mucosa pink and moist, very slight cystocele, and vaginal atrophy.  normal introitus, no external lesions, mucosa pink and moist, cystocele, vaginal stenosis, and vaginal atrophy.   I could not advance speculum into vault due to patient discomfor so I inserted a cotton tipped applicator into the vault to sample the discharge , which was clear and non odorous. Extremities:     Non pitting edema of both feet.   Neurologic:     Done on prior exam.     Problems:  Medical Problems Added: 1)  Dx of Aftercare, Long-term Use, Medications Nec  (ICD-V58.69) 2)  Dx of U R I  (ICD-465.9) 3)  Dx of Vaginitis  (ICD-616.10)   Impression & Recommendations:  Problem # 1:  HYPERTENSION (ICD-401.9) No changes were made today given her current URI and  pain level.   Her updated medication list for this problem includes:    Norvasc 10 Mg Tabs (Amlodipine besylate) .Marland Kitchen... Take 1 tablet by mouth once a day    Hydrochlorothiazide 25 Mg Tab (Hydrochlorothiazide) .Marland Kitchen... Take 1 tablet by mouth once a day    Lisinopril 10 Mg Tabs (Lisinopril) .Marland Kitchen... Take 1 tablet by mouth once a day  BMET was checked today; if cr has not bumped and bp is still elevated next visit, will increase Lisinopril to 20 mg daily.   Problem # 2:  LUMBOSACRAL RADICULOPATHY (ICD-724.4) She has been using vicodin and flexeril for releif but has excessive daytime sedation.   Her updated  medication list for this problem includes:    Flexeril 10 Mg Tab (Cyclobenzaprine hcl) .Marland Kitchen... Take one tablet by mouth three times a day    Tramadol Hcl 50 Mg Tabs (Tramadol hcl) ..... One pill every 6 hours as needed for pain    Vicodin (prescribed on prior visit) one pill at bedtime for pain relief.  Problem # 3:  VAGINITIS (ICD-616.10) No clinical evidence of infection and low risk of STD's; will await wet prep results. Recommend changing soaps as Dial may be too caustic on her llabial/vaginal mucosa.  Also recommeded OTC vaginal lubricants as needed dryness.  Orders: T-Wet Prep (in-house) [id number])   Problem # 4:  U R I (L6477780.9)  Her updated medication list for this problem includes:    Allegra-d 12 Hour 60-120 Mg Tb12 (Fexofenadine-pseudoephedrine) ..... One pill every 12 hours as needed for congestion Mucines-DM every 12 hours as need for cough and sputum.   Medications Added to Medication List This Visit: 1)  Tramadol Hcl 50 Mg Tabs (Tramadol hcl) .... One pill every 6 hours as needed for pain 2)  Allegra-d 12 Hour 60-120 Mg Tb12 (Fexofenadine-pseudoephedrine) .... One pill every 12 hours as needed for congestion  Other Orders: T-Basic Metabolic Panel ([id number])  Future Orders: T-Basic Metabolic Panel ([id number]) ... 05/25/2006   Patient Instructions: 1)  Be sure to return for lab work one (1) week before your next appointment as scheduled.  2)  Take over the counter cough and cold medications only as directed on the package insert. DO NOT take more than recommended and avoid taking unnecessary ingredients. Example: For cough, take only a cough medication. 3)  Discussed with patient that for acute sinusitis symptoms of less than 10 days antibiotics are NOT indicated. Recommended acetaminophen [phone] mg every 4-6 hours (no more than four times a day), warm moist compresses, and over the counter decongestants (only as directed). Call if no improvement in 5-7 days, sooner  if increasing pain, fever, or new symptoms. 4)  Most patients (90%) of patients with low back pain will improve with time (2-6 weeks). Limit activity to comfort and avoid activities that increase discomfort.  Apply moist heat and/or ice to lower back and take medication as instructed for pain relief.

## 2010-04-22 NOTE — Progress Notes (Signed)
Summary: med request/gp  Phone Note Call from Patient   Caller: Patient Call For: Myrtis Ser MD Summary of Call: Pt. wants to know if she  needs to be  restarted on a "cholesterol pill" since she stopped taking Crestor.   Initial call taken by: Morrison Old RN,  May 08, 2009 3:47 PM  Follow-up for Phone Call        Please call and let her know that she does not need a medication at this point, but she should exercise, eat a diet low in saturated fats and increase her fiber intake.  We will recheck in one year.      Impression & Recommendations:  Problem # 1:  HYPERLIPIDEMIA (B2193296.4) Framingham risk is 6% for next 10 years.  She can not take statins due to nausea and vomiting.  She does not need a non-statin medication for primary prevention of CAD, but she should exercise, eat a diet low in saturated fats and increase her fiber intake.  Labs Reviewed: SGOT: 18 (04/26/2009)   SGPT: 17 (04/26/2009)  Prior 10 Yr Risk Heart Disease: 24 % (04/20/2006)   HDL:42 (04/26/2009), 45 (09/12/2008)  LDL:177 (04/26/2009), 131 (09/12/2008)  Chol:258 (04/26/2009), 218 (09/12/2008)  Trig:193 (04/26/2009), 208 (09/12/2008)  Complete Medication List: 1)  Norvasc 10 Mg Tabs (Amlodipine besylate) .... Take 1 tablet by mouth once a day 2)  Lisinopril 40 Mg Tabs (Lisinopril) .... Take 1 tablet by mouth once a day 3)  Hydrochlorothiazide 25 Mg Tabs (Hydrochlorothiazide) .... Take 1 tablet by mouth once a day 4)  Albuterol 90 Mcg/act Aers (Albuterol) .... Take 2 puffs every 4 hours as needed 5)  Advair Diskus 100-50 Mcg/dose Misc (Fluticasone-salmeterol) .... Take 1 puff twice daily 6)  Ativan 1 Mg Tabs (Lorazepam) .... Take one tablet as needed for panic attack.     Appended Document: med request/gp Pt. was called and instructed no med. needed at this point; to cont. exercising,low saturated diet,and increase fiber.  Also will recheck ni a year per Dr. Volanda Napoleon.

## 2010-04-22 NOTE — Assessment & Plan Note (Signed)
Summary: FLU/ SB.  Nurse Visit    Prior Medications: PRAVASTATIN SODIUM 80 MG  TABS (PRAVASTATIN SODIUM) Take 1 tab by mouth at bedtime NORVASC 10 MG TABS (AMLODIPINE BESYLATE) Take 1 tablet by mouth once a day LISINOPRIL 40 MG  TABS (LISINOPRIL) Take 1 tablet by mouth once a day TRAMADOL HCL 50 MG TABS (TRAMADOL HCL) one pill every 6 hours as needed for pain HYDROCHLOROTHIAZIDE 25 MG  TABS (HYDROCHLOROTHIAZIDE) Take 1 tablet by mouth once a day ALBUTEROL 90 MCG/ACT  AERS (ALBUTEROL) Take 2 puffs every 4 hours as needed AYR SALINE NASAL NETI RINSE 1.57 GM  PACK (SODIUM CHLORIDE-SODIUM BICARB) Use 1-2 times daily as directed ZITHROMAX 500 MG  TABS (AZITHROMYCIN) Take one tablet daily for 7 days. Current Allergies: No known allergies   Flu Vaccine Consent Questions     Do you have a history of severe allergic reactions to this vaccine? no    Any prior history of allergic reactions to egg and/or gelatin? no    Do you have a sensitivity to the preservative Thimersol? no    Do you have a past history of Guillan-Barre Syndrome? no    Do you currently have an acute febrile illness? no    Have you ever had a severe reaction to latex? no    Vaccine information given and explained to patient? yes    Are you currently pregnant? no    Lot Number:[id number]    Expiration date 09/20/2007   Site Given  Left Deltoid IM Sander Nephew RN  December 30, 2007 10:38 AM   Orders Added: 1)  Flu Vaccine 37yrs + [id number] 2)  Administration Flu vaccine [id number]    ]

## 2010-04-22 NOTE — Miscellaneous (Signed)
Summary: HIPAA Restrictions  HIPAA Restrictions   Imported By: Bonner Puna 04/18/2008 12:19:43  _____________________________________________________________________  External Attachment:    Type:   Image     Comment:   External Document

## 2010-04-22 NOTE — Assessment & Plan Note (Signed)
Summary: FU/ [facility]) SB.   Vital Signs:  Patient Profile:   61 Years Old Female Height:     68 inches (172.72 cm) Weight:      233.7 pounds Temp:     99.8 degrees F oral Pulse rate:   96 / minute BP sitting:   139 / 89  (right arm)  Pt. in pain?   no  Vitals Entered By: Silverio Decamp (June 23, 2007 11:01 AM)              Is Patient Diabetic? No Nutritional Status BMI of 25 - 29 = overweight  Does patient need assistance? Functional Status Self care Ambulation Normal     PCP:  Caroleen Hamman MD   History of Present Illness: No complaints. Here for a scheduled visit to follow her BP and renal function.    Prior Medication List:  PRAVACHOL 40 MG  TABS (PRAVASTATIN SODIUM) Take 1 tablet by mouth once a day NORVASC 10 MG TABS (AMLODIPINE BESYLATE) Take 1 tablet by mouth once a day LISINOPRIL 40 MG  TABS (LISINOPRIL) Take 1 tablet by mouth once a day TRAMADOL HCL 50 MG TABS (TRAMADOL HCL) one pill every 6 hours as needed for pain HYDROCHLOROTHIAZIDE 25 MG  TABS (HYDROCHLOROTHIAZIDE) Take 1 tablet by mouth once a day ALBUTEROL 90 MCG/ACT  AERS (ALBUTEROL) Take 2 puffs every 4 hours as needed DOXY-CAPS 100 MG  CAPS (DOXYCYCLINE HYCLATE) Take 1 tablet by mouth two times a day ALLEGRA-D 12 HOUR 60-120 MG  TB12 (FEXOFENADINE-PSEUDOEPHEDRINE) Take 1 tablet by mouth once a day AYR SALINE NASAL NETI RINSE 1.57 GM  PACK (SODIUM CHLORIDE-SODIUM BICARB) Use 1-2 times daily as directed   Current Allergies: No known allergies     Risk Factors: Tobacco use:  never Drug use:  no Alcohol use:  no Exercise:  no Seatbelt use:  100 %  Colonoscopy History:    Date of Last Colonoscopy:  12/22/2002  Mammogram History:    Date of Last Mammogram:  02/10/2006   Review of Systems  The patient denies fever, weight loss, chest pain, dyspnea on exhertion, abdominal pain, melena, hematochezia, and severe indigestion/heartburn.     Physical Exam  General:  Well-developed,well-nourished,in no acute distress; alert,appropriate and cooperative throughout examination Neck:     No bruits/goiter Lungs:     Normal respiratory effort, chest expands symmetrically. Lungs are clear to auscultation, no crackles or wheezes. Heart:     Normal rate and regular rhythm. S1 and S2 normal without gallop, murmur, click, rub or other extra sounds. Abdomen:     Bowel sounds positive,abdomen soft and non-tender without masses, organomegaly or hernias noted. Extremities:     2+ left pedal edema and 2+ right pedal edema.      Impression & Recommendations:  Problem # 1:  URI (ICD-465.9) Resolved.  The following medications were removed from the medication list:    Allegra-d 12 Hour 60-120 Mg Tb12 (Fexofenadine-pseudoephedrine) .Marland Kitchen... Take 1 tablet by mouth once a day   Problem # 2:  RENAL INSUFFICIENCY, MILD (ICD-588.9) GFR aprox. 50. Monitor renal function with BMET today.  Orders: T-Basic Metabolic Panel ([id number])   Problem # 3:  EDEMA (ICD-782.3) Stable. About 2+ at the ankles bilaterally. She has had this long before she was started on norvasc.  Her updated medication list for this problem includes:    Hydrochlorothiazide 25 Mg Tabs (Hydrochlorothiazide) .Marland Kitchen... Take 1 tablet by mouth once a day   Problem # 4:  HYPERTENSION (ICD-401.9) BP  ok. Continue current regimen. BMET today to monitor renal function and electrolytes.  Her updated medication list for this problem includes:    Norvasc 10 Mg Tabs (Amlodipine besylate) .Marland Kitchen... Take 1 tablet by mouth once a day    Lisinopril 40 Mg Tabs (Lisinopril) .Marland Kitchen... Take 1 tablet by mouth once a day    Hydrochlorothiazide 25 Mg Tabs (Hydrochlorothiazide) .Marland Kitchen... Take 1 tablet by mouth once a day  BP today: 139/89 Prior BP: 132/83 (05/09/2007)  Prior 10 Yr Risk Heart Disease: 24 % (04/20/2006)  Labs Reviewed: Creat: 1.28 (05/11/2007) Chol: 238 (02/10/2007)   HDL: 43 (02/10/2007)   LDL: 168 (02/10/2007)    TG: 136 (02/10/2007)   Problem # 5:  HYPERLIPIDEMIA (ICD-272.4) FLP ordered on pravastatin.  Her updated medication list for this problem includes:    Pravachol 40 Mg Tabs (Pravastatin sodium) .Marland Kitchen... Take 1 tablet by mouth once a day  Future Orders: T-Lipid Profile [id number]) ... 06/28/2007   Problem # 6:  HEALTH MAINTENANCE EXAM (ICD-V70.0) No further PAP smears given complete hysterectomy. Mammogram due in 10/09. FLP ordered today. Colonoscopy done 2 years ago....will obtain records and place in chart.  Complete Medication List: 1)  Pravachol 40 Mg Tabs (Pravastatin sodium) .... Take 1 tablet by mouth once a day 2)  Norvasc 10 Mg Tabs (Amlodipine besylate) .... Take 1 tablet by mouth once a day 3)  Lisinopril 40 Mg Tabs (Lisinopril) .... Take 1 tablet by mouth once a day 4)  Tramadol Hcl 50 Mg Tabs (Tramadol hcl) .... One pill every 6 hours as needed for pain 5)  Hydrochlorothiazide 25 Mg Tabs (Hydrochlorothiazide) .... Take 1 tablet by mouth once a day 6)  Albuterol 90 Mcg/act Aers (Albuterol) .... Take 2 puffs every 4 hours as needed 7)  Ayr Saline Nasal Neti Rinse 1.57 Gm Pack (Sodium chloride-sodium bicarb) .... Use 1-2 times daily as directed   Patient Instructions: 1)  Please schedule a follow-up appointment in 2 months.    ]  Vital Signs:  Patient Profile:   60 Years Old Female Height:     68 inches (172.72 cm) Weight:      233.7 pounds Temp:     99.8 degrees F oral Pulse rate:   96 / minute BP sitting:   139 / 89

## 2010-04-22 NOTE — Assessment & Plan Note (Signed)
Summary: ADD PER GAYLE/DS   Vital Signs:  Patient Profile:   60 Years Old Female Height:     68 inches (172.72 cm) Weight:      237.6 pounds (108.00 kg) BMI:     36.26 O2 Sat:      100 % O2 treatment:    Room Air Temp:     98.0 degrees F (36.67 degrees C) oral Pulse rate:   99 / minute BP sitting:   158 / 92  (right arm)  Pt. in pain?   yes    Location:   back    Intensity:   9  Vitals Entered By: Hilda Blades Ditzler RN (December 09, 2007 3:50 PM)              Is Patient Diabetic? No Nutritional Status BMI of 25 - 29 = overweight Nutritional Status Detail appetite good  Have you ever been in a relationship where you felt threatened, hurt or afraid?denies   Does patient need assistance? Functional Status Self care Ambulation Normal     PCP:  Caroleen Hamman MD  Chief Complaint:  Past 3 days yellow productive couh and wheezing and head congestion.Marland Kitchen  History of Present Illness: 60 y/o with PMH HTN, HLP and d/c 1 month ago from the ED for a PNA comes infor wheezing for 3 day, productive coughing for 3 days with no blood. Nasal cangestion, SOB since 3 day. Worst with excercise for the past 3 days. She is not baseline SOB. She has been told in the past she has asthma but is on no medication for this.  She usually gets this episodes of cough for the past 3 years. which last about 1 month. But now she is worry is a PNA. No fever, chills, N/V no diarhea. nomuscle aches. No CP, not worst with food or laying down or burning sensation.    Prior Medication List:  PRAVASTATIN SODIUM 80 MG  TABS (PRAVASTATIN SODIUM) Take 1 tab by mouth at bedtime NORVASC 10 MG TABS (AMLODIPINE BESYLATE) Take 1 tablet by mouth once a day LISINOPRIL 40 MG  TABS (LISINOPRIL) Take 1 tablet by mouth once a day TRAMADOL HCL 50 MG TABS (TRAMADOL HCL) one pill every 6 hours as needed for pain HYDROCHLOROTHIAZIDE 25 MG  TABS (HYDROCHLOROTHIAZIDE) Take 1 tablet by mouth once a day ALBUTEROL 90 MCG/ACT  AERS  (ALBUTEROL) Take 2 puffs every 4 hours as needed AYR SALINE NASAL NETI RINSE 1.57 GM  PACK (SODIUM CHLORIDE-SODIUM BICARB) Use 1-2 times daily as directed ZITHROMAX 500 MG  TABS (AZITHROMYCIN) Take one tablet daily for 7 days.   Current Allergies (reviewed today): No known allergies     Risk Factors: Tobacco use:  never Drug use:  no Alcohol use:  no Exercise:  no Seatbelt use:  100 %  Colonoscopy History:    Date of Last Colonoscopy:  12/22/2002  Mammogram History:    Date of Last Mammogram:  02/10/2006   Review of Systems  The patient denies anorexia, fever, weight loss, weight gain, decreased hearing, hoarseness, syncope, dyspnea on exertion, peripheral edema, headaches, hemoptysis, melena, hematochezia, severe indigestion/heartburn, hematuria, incontinence, genital sores, muscle weakness, suspicious skin lesions, transient blindness, difficulty walking, unusual weight change, and abnormal bleeding.     Physical Exam  General:     Well-developed,well-nourished,in no acute distress; alert,appropriate and cooperative throughout examination Ears:     External ear exam shows no significant lesions or deformities.  Otoscopic examination reveals clear canals, tympanic membranes are intact bilaterally  without bulging, retraction, inflammation or discharge. Hearing is grossly normal bilaterally. Mouth:     Oral mucosa and oropharynx without lesions or exudates.  Teeth in good repair. Lungs:     Normal respiratory effort, chest expands symmetrically. Lungs are clear to auscultation, no crackles or wheezes. Heart:     Normal rate and regular rhythm. S1 and S2 normal without gallop, murmur, click, rub or other extra sounds.    Impression & Recommendations:  Problem # 1:  COUGH (ICD-786.2) Acute Cough DDX PNA vs acute bronquitis, post nasal drip, doubt this is asthma on 60 y/o not worst with laying down or with food which make GERD unlikely, she is on an ACE but I do not think  this is contributing to this.  She has episodes like this in the past 3 years.  So chronic bronquitis is high on the DDX I will go ahead and get a CXR and CBC with Dif.  Will go ahead and get PFT's in month. Future Orders: PFT's Baseline w/ DLCO (PFT's Baseline-DLCO) ... 01/18/2008 PFT Baseline-Pre/Post Bronchodiolator (PFT Baseline-Pre/Pos) ... 01/18/2008 PFT Basline & Lung Volume (PFT Baseline-Lung  V) ... 01/18/2008 PFT Baseline (PFT Baseline) ... 01/18/2008 Methocholine Challenge (Methocholine) ... 01/18/2008   Problem # 2:  HYPERTENSION (ICD-401.9) Slightly high today, I will change not her medication today b/o this an acute process. will recheck in 2 weeks. If high will need to change her hydroclorothiazide to lasix. Her updated medication list for this problem includes:    Norvasc 10 Mg Tabs (Amlodipine besylate) .Marland Kitchen... Take 1 tablet by mouth once a day    Lisinopril 40 Mg Tabs (Lisinopril) .Marland Kitchen... Take 1 tablet by mouth once a day    Hydrochlorothiazide 25 Mg Tabs (Hydrochlorothiazide) .Marland Kitchen... Take 1 tablet by mouth once a day   Complete Medication List: 1)  Pravastatin Sodium 80 Mg Tabs (Pravastatin sodium) .... Take 1 tab by mouth at bedtime 2)  Norvasc 10 Mg Tabs (Amlodipine besylate) .... Take 1 tablet by mouth once a day 3)  Lisinopril 40 Mg Tabs (Lisinopril) .... Take 1 tablet by mouth once a day 4)  Tramadol Hcl 50 Mg Tabs (Tramadol hcl) .... One pill every 6 hours as needed for pain 5)  Hydrochlorothiazide 25 Mg Tabs (Hydrochlorothiazide) .... Take 1 tablet by mouth once a day 6)  Albuterol 90 Mcg/act Aers (Albuterol) .... Take 2 puffs every 4 hours as needed 7)  Ayr Saline Nasal Neti Rinse 1.57 Gm Pack (Sodium chloride-sodium bicarb) .... Use 1-2 times daily as directed 8)  Zithromax 500 Mg Tabs (Azithromycin) .... Take one tablet daily for 7 days.  Other Orders: Diagnostic X-Ray/Fluoroscopy (Diagnostic X-Ray/Flu) T-CBC w/Diff [id number]) T-Comprehensive Metabolic Panel  ([id number])   Patient Instructions: 1)  Please schedule an appointment with your primary doctor. 2)  Will be call if results abnormal.   ]

## 2010-04-22 NOTE — Progress Notes (Signed)
Summary: appt/ hla  Phone Note Call from Patient   Summary of Call: pt calls says bowel problems are worse, took ducolax last pm, has had severe abd pain since, 10/10 pain scale. she wants to be seen, appt is given. nothing alleviates the pain, nothing increases. pt is made aware that she may go to ED or urg care if she gets worse, she is agreeable Initial call taken by: Freddy Finner RN,  February 24, 2010 10:19 AM

## 2010-04-22 NOTE — Assessment & Plan Note (Signed)
Summary: wheezing, cough/gg   Vital Signs:  Patient Profile:   60 Years Old Female Height:     68 inches (172.72 cm) Weight:      233.1 pounds (105.95 kg) BMI:     35.57 Temp:     98.2 degrees F (36.78 degrees C) oral Pulse rate:   102 / minute BP sitting:   132 / 83  (right arm)  Pt. in pain?   yes    Location:   head,teeth and back    Intensity:   7  Vitals Entered By: Hilda Blades Ditzler RN (May 09, 2007 2:19 PM)              Is Patient Diabetic? No Nutritional Status BMI of > 30 = obese Nutritional Status Detail fair  Have you ever been in a relationship where you felt threatened, hurt or afraid?denies   Does patient need assistance? Functional Status Self care Ambulation Normal     PCP:  Caroleen Hamman MD  Chief Complaint:  Since 05/06/07 - h/a, throat irritated, ears, eyes and teeth hurt., and Cough.  History of Present Illness: CC: URI for 7 Days      This is a 60 year old woman who presents with worsening uri symptoms. She reports a 7 day history of upper respiratory infection whic includes maxillay pain and tenderness, eye pain, headache, thick green mucous, earache, and muscle aches.  The patient also reports productive cough and wheezing. Other associated symtpoms include cold/URI symptoms, sore throat, and nasal congestion.  Ineffective prior treatments have included OTC cough medication.  Risk factors include recurrent sinus infections and history of allergic rhinitis.        Prior Medication List:  PRAVACHOL 40 MG  TABS (PRAVASTATIN SODIUM) Take 1 tablet by mouth once a day NORVASC 10 MG TABS (AMLODIPINE BESYLATE) Take 1 tablet by mouth once a day LISINOPRIL 40 MG  TABS (LISINOPRIL) Take 1 tablet by mouth once a day TRAMADOL HCL 50 MG TABS (TRAMADOL HCL) one pill every 6 hours as needed for pain HYDROCHLOROTHIAZIDE 25 MG  TABS (HYDROCHLOROTHIAZIDE) Take 1 tablet by mouth once a day ALBUTEROL 90 MCG/ACT  AERS (ALBUTEROL) Take 2 puffs every 4 hours as  needed   Updated Prior Medication List: PRAVACHOL 40 MG  TABS (PRAVASTATIN SODIUM) Take 1 tablet by mouth once a day NORVASC 10 MG TABS (AMLODIPINE BESYLATE) Take 1 tablet by mouth once a day LISINOPRIL 40 MG  TABS (LISINOPRIL) Take 1 tablet by mouth once a day TRAMADOL HCL 50 MG TABS (TRAMADOL HCL) one pill every 6 hours as needed for pain HYDROCHLOROTHIAZIDE 25 MG  TABS (HYDROCHLOROTHIAZIDE) Take 1 tablet by mouth once a day ALBUTEROL 90 MCG/ACT  AERS (ALBUTEROL) Take 2 puffs every 4 hours as needed  Current Allergies (reviewed today): No known allergies   Past Medical History:    Reviewed history from 05/04/2006 and no changes required:       Depression-seasonal melancholia       Hyperlipidemia       Hypertension, stage II       Back pain with degenerative end plates, lumbar spondylosis       Myopathy etiology unclear, not statin induced   Family History:    Reviewed history from 04/20/2006 and no changes required:       Family History of breast cancer 1st degree relative, Mom died at age 48       Family History of CAD Female 1st degree relative <60  Family History of Colon CA 1st degree relative, Father died at age 42  Social History:    Reviewed history from 04/20/2006 and no changes required:       Single, 2 kids       Alcohol use-no       Drug use-no       Laid off from [employer] after 13yrs    Risk Factors: Tobacco use:  never Drug use:  no Alcohol use:  no Exercise:  no Seatbelt use:  100 %  Colonoscopy History:    Date of Last Colonoscopy:  12/22/2002  Mammogram History:    Date of Last Mammogram:  02/10/2006   Review of Systems      See HPI   Physical Exam  General:     Mild distress, appears uncomfotable and congested Head:     atraumatic.   Eyes:     sclera injected Ears:     red TM, no effusion Nose:     Turbinates are inflammed Mouth:     clear  Neck:     supple Chest Wall:     non-tender Lungs:     Clear  w/Slight expiratory wheeze Heart:     RRR no murmur Abdomen:     Non-tender, soft Msk:     no tenderness to palpation Skin:     no rash    Impression & Recommendations:  Problem # 1:  URI (ICD-465.9) Will give 10 day course of Doxycycline for URI/Bronchitis/Sinusitis given length of illness and failure of conservative measures. Prescription also given for a decongestant. Counseled on symptom management. Advised her to use a NetiSaline Nasal Rinse.   Her updated medication list for this problem includes:    Allegra-d 12 Hour 60-120 Mg Tb12 (Fexofenadine-pseudoephedrine) .Marland Kitchen... Take 1 tablet by mouth once a day   Complete Medication List: 1)  Pravachol 40 Mg Tabs (Pravastatin sodium) .... Take 1 tablet by mouth once a day 2)  Norvasc 10 Mg Tabs (Amlodipine besylate) .... Take 1 tablet by mouth once a day 3)  Lisinopril 40 Mg Tabs (Lisinopril) .... Take 1 tablet by mouth once a day 4)  Tramadol Hcl 50 Mg Tabs (Tramadol hcl) .... One pill every 6 hours as needed for pain 5)  Hydrochlorothiazide 25 Mg Tabs (Hydrochlorothiazide) .... Take 1 tablet by mouth once a day 6)  Albuterol 90 Mcg/act Aers (Albuterol) .... Take 2 puffs every 4 hours as needed 7)  Doxy-caps 100 Mg Caps (Doxycycline hyclate) .... Take 1 tablet by mouth two times a day 8)  Allegra-d 12 Hour 60-120 Mg Tb12 (Fexofenadine-pseudoephedrine) .... Take 1 tablet by mouth once a day 9)  Ayr Saline Nasal Neti Rinse 1.57 Gm Pack (Sodium chloride-sodium bicarb) .... Use 1-2 times daily as directed   Patient Instructions: 1)  Keep appointment next week. 2)  Get plenty of rest, drink lots of clear liquids, and use tylenol or Ibuprophen for fever and comfort. return in 7-10 days if you're not better:sooner if you're feeliong worse. 3)  Take your antibiotic as prescribed until ALL of it is gone, but stop if you develop a rash or swelling and contact our office as soon as possible.    Prescriptions: AYR SALINE NASAL NETI RINSE  1.57 GM  PACK (SODIUM CHLORIDE-SODIUM BICARB) Use 1-2 times daily as directed  #1 pack x 11   Entered and Authorized by:   Rhea Pink  DO   Signed by:   Rhea Pink  DO on 05/09/2007  Method used:   Electronically sent to ...       [place], [place]  [address]       Ph: [phone] or [phone]       Fax: [phone]   RxID:   [id number] TRAMADOL HCL 50 MG TABS (TRAMADOL HCL) one pill every 6 hours as needed for pain  #100 x 3   Entered and Authorized by:   Rhea Pink  DO   Signed by:   Rhea Pink  DO on 05/09/2007   Method used:   Print then Give to Patient   RxID:   [id number] ALLEGRA-D 12 HOUR 60-120 MG  TB12 (FEXOFENADINE-PSEUDOEPHEDRINE) Take 1 tablet by mouth once a day  #14 x 0   Entered and Authorized by:   Rhea Pink  DO   Signed by:   Rhea Pink  DO on 05/09/2007   Method used:   Electronically sent to ...       [place], [place]  [address]       Ph: [phone] or [phone]       Fax: [phone]   RxID:   [id number] DOXY-CAPS 100 MG  CAPS (DOXYCYCLINE HYCLATE) Take 1 tablet by mouth two times a day  #20 x 0   Entered and Authorized by:   Rhea Pink  DO   Signed by:   Rhea Pink  DO on 05/09/2007   Method used:   Electronically sent to ...       [facility] [phone]*       [address]       [place], [place]  [address]       Ph: [phone] or [phone]       Fax: [phone]   RxID:   [id number]  ]

## 2010-04-22 NOTE — Progress Notes (Signed)
Summary: Acute abd pain/med questions  Phone Note Call from Patient Call back at Home Phone [phone]   Caller: Patient Summary of Call: Pt stated that the 3 medications she has been taking in the morning have been causing abd pain x 3 days. Happens 1 hour after taking them. She takes them after eating breakfast. She said it only happens in the mornings and lets up later in the day. No problems with having a BM. No c/o urinary problems or burning or itching in the vaginal area. No c/o nausea or vomitting. Hurts in RLQ and shifts to LUQ and back to the right. Rates it an 7-8/10 on the pain scale. Laying down to rest helps it a little bit. Moving around makes it worse.  The three medications are Norvasc, Lisinopril, and HCTZ. Please advise. Initial call taken by: Pollyann Samples,  October 22, 2006 12:18 PM  Follow-up for Phone Call        Please have her added to the afternoon clinic schedule today. ..................................................................Marland KitchenBertha Stakes MD  October 22, 2006 12:38 PM   Additional Follow-up for Phone Call Additional follow up Details #1::        I called the pt and told her to come over to the clinic and that though she may have to wait a while, we wll try to see her this afternoon.  Double-booked per Dr. Marinda Elk for 2pm for Dr. Venia Carbon. Additional Follow-up by: Pollyann Samples,  October 22, 2006 1:20 PM

## 2010-04-22 NOTE — Progress Notes (Signed)
Summary: phone/gg  Phone Note Call from Patient   Caller: Patient Summary of Call: Pt called with c/o abdominal pain and not feeling well when taking crestor. She has stopped taking it.  She had the same problem with lipitor.  Can you change it back to pravachol? Pt # [id number] Initial call taken by: Gevena Cotton RN,  April 25, 2008 4:21 PM  Follow-up for Phone Call        Changed back to pravachol, that's fine.    New/Updated Medications: PRAVACHOL 80 MG TABS (PRAVASTATIN SODIUM) Take one tablet daily.   Prescriptions: PRAVACHOL 80 MG TABS (PRAVASTATIN SODIUM) Take one tablet daily.  #32 x 6   Entered and Authorized by:   Myrtis Ser MD   Signed by:   Myrtis Ser MD on 04/26/2008   Method used:   Electronically to        [facility] [phone]* (retail)       [address]       [place], [place]  [address]       Ph: [phone] or [phone]       Fax: [phone]   RxID:   [id number]

## 2010-04-22 NOTE — Miscellaneous (Signed)
Summary: medication change  Clinical Lists Changes  Medications: Added new medication of PRAVACHOL 20 MG TABS (PRAVASTATIN SODIUM) Take 1 tablet by mouth at bedtime - Signed Rx of PRAVACHOL 20 MG TABS (PRAVASTATIN SODIUM) Take 1 tablet by mouth at bedtime;  #31 x 2;  Signed;  Entered by: Deborra Medina MD;  Authorized by: Deborra Medina MD;  Method used: Telephoned to

## 2010-04-22 NOTE — Progress Notes (Signed)
Summary: ? URI  Phone Note Call from Patient   Caller: Patient Summary of Call: Call from pt c/o of coughing and some wheezing thinks she has a Sinus infection.  Pt given appointment in the Clinics for this am. Sander Nephew RN  February 09, 2008 9:25 AM  Initial call taken by: Sander Nephew RN,  February 09, 2008 9:31 AM

## 2010-04-22 NOTE — Consult Note (Signed)
Summary: [facility].  [facility].   Imported By: Bonner Puna 09/25/2008 16:08:13  _____________________________________________________________________  External Attachment:    Type:   Image     Comment:   External Document

## 2010-04-22 NOTE — Progress Notes (Signed)
  Phone Note Outgoing Call Call back at [facility] Phone [phone]   Call placed to: Patient Details for Reason: REFERRALS INCREASE OF MEDICINE Summary of Call: LEFT VOICE MESSAGE FOR [facility].  TWO REFERRAL MADE FOR HER (SEE ORDERS). PATIENT IN INCREASE PRAVACHOL TO 2 PILLS DAILY/ PATIENT NEED TO BE TAKING 20MG  PER LAST CHLORESTEROL TEST. PATIENT ALSO NEEDS TO HAVE BLOOD WORK IN 2 WEEKS.  REFERRAL- ULTRA SOUND OF KIDNEY AND POLYSOMNOGRAM. LELA STURDIVANT NTII

## 2010-04-22 NOTE — Assessment & Plan Note (Signed)
Summary: EST-CK/FU/MEDS/CFB   Vital Signs:  Patient Profile:   60 Years Old Female Height:     68 inches (172.72 cm) Weight:      230.8 pounds (104.91 kg) Temp:     98.0 degrees F (36.67 degrees C) oral Pulse rate:   90 / minute BP sitting:   131 / 86  (left arm) Cuff size:   large  Pt. in pain?   yes    Location:   lower back    Intensity:   7    Type:       stinging  Vitals Entered ByLucky Rathke (Aug 17, 2006 2:01 PM)              Is Patient Diabetic? No  Does patient need assistance? Functional Status Self care Ambulation Normal Comments patient walks with a cane. complain of numbness in left arm only when she lay down.    Chief Complaint:  lower back pain / routine check up / joint stiffness in back/ some numbness and swellin in legs.  History of Present Illness: Ms. Gorra returns for followup on HTN, back pain, and hyperlipdemia,  Her chief complain is bilateral pedal edema.  She notes that her fee have been swollen since 1971, after the birth of her first child.  She states she has had preior workup for sleep apnea and CAD/cardiomyopathy but records are not avalable locally due to their historical nature.  She uses compression stockings on and off which help a little.  She also notes occasional chest tightness early in AM.   Had a stress test by [facility] in 2004/5   which was reportedly normal.  Continues to have pain, numbness in lower legs and lower back and stiffness of back and legs.  A recent Lumbar MRI showed Right S1 nerve root deviation due to herniated disk.  Taking ultram as needed.  Receive significant help from the massage therapist who treats her daughter who has MS   Takes paindown to a 7 or 8.   Current Allergies: No known allergies   Past Surgical History:    Hysterectomy    Cervical laminectomy c5-c6, c4-c5 left  nitka 2005    Oophorectomy     Review of Systems       The patient complains of dyspnea on exhertion and  peripheral edema.  The patient denies fever, weight loss, hematochezia, and severe indigestion/heartburn.       Impression & Recommendations:  Problem # 1:  EDEMA (Z5899001.3) Need to once again rule out CHF and sleep apnea given her obesity and symptoms.  Also given her mild renal insufficiency, witll dc HCTZ and use Lasix 40 mg daily.  Baseline and f/u BMETs ordered.   The following medications were removed from the medication list:    Hydrochlorothiazide 25 Mg Tab (Hydrochlorothiazide) .Marland Kitchen... Take 1 tablet by mouth once a day  Her updated medication list for this problem includes:    Lasix 40 Mg Tabs (Furosemide) ..... Once daily tab in am  Orders: Diagnostic Polysomnogram (Diagnostic Polysomno) Split Night (Split Night)   Problem # 2:  HYPERLIPIDEMIA (B2193296.4) She hasn't had a fasting lipid panel since the prastatin was started.   Her updated medication list for this problem includes:    Pravachol 10 Mg Tabs (Pravastatin sodium)  Future Orders: T-Lipid Profile [id number]) ... 08/24/2006   Problem # 3:  HYPOKALEMIA, MILD (ICD-276.8) Assessment: Unchanged Preior BMET in 2/07 noted K of 3.3 likely due to HCTZ but patient  was never called with KCL.  Will recheck today and begin 40 meQ daily and repeat in 1 week while on Lasix and KCL. Orders: T-Basic Metabolic Panel ([id number])   Medications Added to Medication List This Visit: 1)  Lasix 40 Mg Tabs (Furosemide) .... Once daily tab in am 2)  Kay Ciel 20 Meq Pack (Potassium chloride) .... 2 pills daily  with food  Other Orders: Future Orders: T-Basic Metabolic Panel ([id number]) ... 08/24/2006 2 D Echo (2 D Echo) ... 08/26/2006   Patient Instructions: 1)  Please schedule a follow-up appointment in 1 month. 2)  Please return for a FASTING Lipid Profile in 1 week.     Vital Signs:  Patient Profile:   59 Years Old Female Height:     68 inches (172.72 cm) Weight:      230.8 pounds (104.91 kg) Temp:     98.0  degrees F (36.67 degrees C) oral Pulse rate:   90 / minute BP sitting:   131 / 86 Cuff size:   large    Location:   lower back    Intensity:   7    Type:       stinging              Prescriptions: TRAMADOL HCL 50 MG TABS (TRAMADOL HCL) one pill every 6 hours as needed for pain  #90 x 3   Entered and Authorized by:   Deborra Medina MD   Signed by:   Deborra Medina MD on 08/17/2006   Method used:   Print then Give to Patient   RxID:   [id number] KAY CIEL 20 MEQ PACK (POTASSIUM CHLORIDE) 2 pills daily  with food  #62 x 1   Entered and Authorized by:   Deborra Medina MD   Signed by:   Deborra Medina MD on 08/17/2006   Method used:   Print then Give to Patient   RxID:   [id number] LASIX 40 MG TABS (FUROSEMIDE) once daily tab in am  #31 x 1   Entered and Authorized by:   Deborra Medina MD   Signed by:   Deborra Medina MD on 08/17/2006   Method used:   Print then Give to Patient   RxID:   [id number]    Appended Document: EST-CK/FU/MEDS/CFB PATIENT WAS SCHEDULED FOR SLEEP STUDY ON 10/08/06 @ 8:30 PM . [facility]EP [facility]ENTER SENT LETTER THAT PATIENT CALLED AND CANCELLED THE APPOINTMENT. AND THE PATIENT IS TO CALL THEM BACK TO RESCHEDULE THE SLEEP STUDY.  LELA STURDIVANT NTII

## 2010-04-22 NOTE — Progress Notes (Signed)
----   Converted from flag ---- ---- 09/20/2008 4:05 PM, Morrison Old RN wrote:   ---- 09/17/2008 11:02 AM, Morrison Old RN wrote: Dr. Volanda Napoleon, [name] states she can not take Lipitor b/c it causes stomach pains. She was made aware of CXR and new Rx sent to her pharmacy per Dr. Volanda Napoleon. And she will make an appt. for labs next month.  ---- 09/14/2008 6:08 AM, Myrtis Ser MD wrote: Please call [name] and let her know 1) her chest xray does not show any signs of infection. I think she is having an allergic reaction which is causing her cough and I have sent a prescription to her pharmacy which should help. 2) her cholesterol is still high on pravachol. I have written for lipitor which is a stronger medication to lower cholesterol. 3) she will need to come back in next month for some more lab work. Thanks! [name] ------------------------------

## 2010-04-22 NOTE — Progress Notes (Signed)
Summary: med refill/gp  Phone Note Refill Request Message from:  Fax from Pharmacy on March 04, 2009 9:10 AM  Refills Requested: Medication #1:  HYDROCHLOROTHIAZIDE 25 MG  TABS Take 1 tablet by mouth once a day   Last Refilled: 01/03/2009  Method Requested: Electronic Initial call taken by: Morrison Old RN,  March 04, 2009 9:10 AM    Prescriptions: HYDROCHLOROTHIAZIDE 25 MG  TABS (HYDROCHLOROTHIAZIDE) Take 1 tablet by mouth once a day  #30 x 11   Entered and Authorized by:   Myrtis Ser MD   Signed by:   Myrtis Ser MD on 03/06/2009   Method used:   Electronically to        [facility] [phone]* (retail)       [place], [place]  [id number]       Ph: [phone] or [phone]       Fax: [phone]   RxID:   [id number]

## 2010-04-22 NOTE — Progress Notes (Signed)
Summary: REfill/gh  Phone Note Refill Request Message from:  Fax from Pharmacy on March 19, 2009 11:32 AM  Refills Requested: Medication #1:  LISINOPRIL 40 MG  TABS Take 1 tablet by mouth once a day   Last Refilled: 01/03/2009  Medication #2:  NORVASC 10 MG TABS Take 1 tablet by mouth once a day  Method Requested: Electronic Initial call taken by: Sander Nephew RN,  March 19, 2009 11:33 AM    Prescriptions: LISINOPRIL 40 MG  TABS (LISINOPRIL) Take 1 tablet by mouth once a day  #30 x 11   Entered and Authorized by:   Burman Freestone MD   Signed by:   Burman Freestone MD on 03/19/2009   Method used:   Electronically to        [facility] [phone]* (retail)       [place], [place]  [id number]       Ph: [phone] or [phone]       Fax: [phone]   [id number] NORVASC 10 MG TABS (AMLODIPINE BESYLATE) Take 1 tablet by mouth once a day  #30 x 11   Entered and Authorized by:   Burman Freestone MD   Signed by:   Burman Freestone MD on 03/19/2009   Method used:   Electronically to        [facility] [phone]* (retail)       [place], [place]  [id number]       Ph: [phone] or [phone]       Fax: [phone]   RxID:   [id number]

## 2010-04-22 NOTE — Miscellaneous (Signed)
Summary: Hospital D/C  [facility] Discharge  Date of admission: May 6th, 2010  Date of discharge: May 7th,2010  Brief reason for admission/active problems:  1. Acute on chronic lower back pain, most likely MSK pain, to continue conservative management, Tramadol frequency increased to q4.  Followup needed:  Address patients back pain. If the pain continues to worsen, consider Physical therapy or amitriptyline/ cymbalta for chronic  pain syndrome.   The medication and problem lists have been updated.  Please see the dictated discharge summary for details.       Impression & Recommendations:  Problem # 1:  BACK PAIN, CHRONIC (ICD-724.5) Patient was admitted to [facility] for chronic low back pain.  There were no signs or symptoms of sciatica, cauda equina syndrome. Patient had a CT scan of abdomen and pelvis as per radiologist she does have disk bulgings in the L3/L4 and L4/L5 areas. Plan is to continue conservative management and OP follow up. May be an OP physical therapy referral would be beneficial. After D/W Dr. Clifton James, we dont think she needs further w/u such as MRI or an orthopedic referral. Will increase her Tramadol frequency to q4 from q6. Will try to avoid opiates, as there is no benefit in patients in chronic back pain compared to Tramadol. If the back pain worsens, might also consider starting on Amitryptyline / cymbalta.   Her updated medication list for this problem includes:    Tramadol Hcl 50 Mg Tabs (Tramadol hcl) ..... One pill every 4 hours as needed for pain   Complete Medication List: 1)  Norvasc 10 Mg Tabs (Amlodipine besylate) .... Take 1 tablet by mouth once a day 2)  Lisinopril 40 Mg Tabs (Lisinopril) .... Take 1 tablet by mouth once a day 3)  Tramadol Hcl 50 Mg Tabs (Tramadol hcl) .... One pill every 4 hours as needed for pain 4)  Hydrochlorothiazide 25 Mg Tabs (Hydrochlorothiazide) .... Take 1 tablet by mouth once a day 5)  Albuterol 90  Mcg/act Aers (Albuterol) .... Take 2 puffs every 4 hours as needed 6)  Advair Diskus 100-50 Mcg/dose Misc (Fluticasone-salmeterol) .... Take 1 puff twice daily 7)  Flonase 50 Mcg/act Susp (Fluticasone propionate) .... Take 2 sprays in each nostril once daily for 4 days, and then one spray in each nostril once daily for a week 8)  Pravachol 80 Mg Tabs (Pravastatin sodium) .... Take one tablet daily.   Patient Instructions: 1)  Follow up in the outpatient clinic with Dr. Volanda Napoleon on June 9th, 2010 at 1:30 PM. 2)  Take Tramadol 50 mg every 4 hours as needed for pain. 3)  Take all the other medications as advised before. 4)  If the pain worsens, or if you have controlling your bowel / bladder, call the clinic or seek medical help.

## 2010-04-22 NOTE — Assessment & Plan Note (Signed)
Summary: BP CHECK AND LAB WORK([facility])/DS   Vital Signs:  Patient Profile:   60 Years Old Female Height:     68 inches (172.72 cm) Weight:      229.6 pounds (104.36 kg) BMI:     35.04 Temp:     97.6 degrees F (36.44 degrees C) oral Pulse rate:   94 / minute BP sitting:   141 / 77  (right arm) Cuff size:   large  Pt. in pain?   yes    Location:   back    Intensity:   8    Type:       aching  Vitals Entered By: Lucky Rathke (March 03, 2007 9:03 AM)              Nutritional Status NORMAL  Have you ever been in a relationship where you felt threatened, hurt or afraid?No   Does patient need assistance? Functional Status Self care Ambulation Normal     PCP:  Caroleen Hamman MD  Chief Complaint:  RESULTS OF LABS / FOLLOW UP ON BP.  History of Present Illness: Here today for a BP check after her lisinopril was increased to 20 mg. She has been taking her medications as prescribed. BP remains elevated today. She has no acute complaints.  Current Allergies: No known allergies     Risk Factors:  Tobacco use:  never Drug use:  no Alcohol use:  no Exercise:  no Seatbelt use:  100 %  Colonoscopy History:    Date of Last Colonoscopy:  12/22/2002  Mammogram History:    Date of Last Mammogram:  02/10/2006   Review of Systems  The patient denies fever, weight loss, chest pain, dyspnea on exhertion, abdominal pain, melena, hematochezia, and severe indigestion/heartburn.     Physical Exam  General:     Well-developed,well-nourished,in no acute distress; alert,appropriate and cooperative throughout examination Neck:     No bruits/goiter Lungs:     Normal respiratory effort, chest expands symmetrically. Lungs are clear to auscultation, no crackles or wheezes. Heart:     Normal rate and regular rhythm. S1 and S2 normal without gallop, murmur, click, rub or other extra sounds. Extremities:     1+ left pedal edema and 1+ right pedal edema.      Impression &  Recommendations:  Problem # 1:  HYPERTENSION (ICD-401.9) BP remains elevated today. Will increase her lisinopril to 40 and have her return in 2 weeks for a BMET (to monitor electrolytes and renal function) and in 1 month for a BP check.  Her updated medication list for this problem includes:    Norvasc 10 Mg Tabs (Amlodipine besylate) .Marland Kitchen... Take 1 tablet by mouth once a day    Lisinopril 40 Mg Tabs (Lisinopril) .Marland Kitchen... Take 1 tablet by mouth once a day    Hydrochlorothiazide 25 Mg Tabs (Hydrochlorothiazide) .Marland Kitchen... Take 1 tablet by mouth once a day  Future Orders: T-Basic Metabolic Panel (99991111) ... 03/16/2007  BP today: 141/77 Prior BP: 152/88 (02/15/2007)  Prior 10 Yr Risk Heart Disease: 24 % (04/20/2006)  Labs Reviewed: Creat: 1.20 (02/15/2007) Chol: 238 (02/10/2007)   HDL: 43 (02/10/2007)   LDL: 168 (02/10/2007)   TG: 136 (02/10/2007)   Complete Medication List: 1)  Pravachol 40 Mg Tabs (Pravastatin sodium) .... Take 1 tablet by mouth once a day 2)  Norvasc 10 Mg Tabs (Amlodipine besylate) .... Take 1 tablet by mouth once a day 3)  Lisinopril 40 Mg Tabs (Lisinopril) .... Take 1 tablet by  mouth once a day 4)  Tramadol Hcl 50 Mg Tabs (Tramadol hcl) .... One pill every 6 hours as needed for pain 5)  Hydrochlorothiazide 25 Mg Tabs (Hydrochlorothiazide) .... Take 1 tablet by mouth once a day 6)  Albuterol 90 Mcg/act Aers (Albuterol) .... Take 2 puffs every 4 hours as needed   Patient Instructions: 1)  Please schedule a follow-up appointment in 1 month for a blood pressure check. 2)  You will come in 2 weeks for a lab visit to check on your kidneys and potassium.    Prescriptions: LISINOPRIL 40 MG  TABS (LISINOPRIL) Take 1 tablet by mouth once a day  #30 x 3   Entered and Authorized by:   Domingo Mend MD   Signed by:   Domingo Mend MD on 03/03/2007   Method used:   Electronically sent to ...       [facility] [phone]*       [address]       [place], [place]  [address]       Ph: [phone] or [phone]       Fax: [phone]   RxID:   [id number]  ]

## 2010-04-22 NOTE — Progress Notes (Signed)
  Phone Note Call from Patient   Caller: [name] Summary of Call: PATIENT RETURNED [facility] APPT (SEE ORDERS). PATIENT STATES SHE MIGHT HAVE TO RESCHEDULE THE APPOINTMENTS DUE TO SICKNESS, PATIENT WAS INSTURCTED TO CALL X-RAY IF NEED BE.  ALSO WAS INSTURCTED TO INCREASE HER PRAVACHOLTO 2 PILL DAILY TO EQUAL 20MG .  DR. Derrel Nip HAS PUT IN ORDER FOR REFILLS. PATIENT WILL BE COMING IN FOR LAB WORK September 13, 2006. ORDERS TAKEN TO LAB BY ME.   Jalayne Ganesh [place] Call placed by: Lucky Rathke,  August 30, 2006 4:53 PM

## 2010-04-22 NOTE — Discharge Summary (Signed)
Summary: Hospital Discharge Update    Hospital Discharge Update:  Date of Admission: 02/24/2010 Date of Discharge: 02/26/2010  Brief Summary:  38 year woman with pmh as described in the EMR; who was admitted from the clinic due to acute abdominal pain. While in the hospital she was found to have ileitis and also UTI. Patient received treatment for her uti with ciprofloxacin, her stool was guaiac and neg for FOBT; her abdominal pain resolved and she was tolerating po's,  the decision to perform colonoscopy as an outpatient was taken. Patient also had mild elevation of her creatinine (up to 1.8), which was thought to be secondary to a combination of poor intake/mild dehydration, contrast use and the use of lisinopril and HCTS in this circumstances. Her ACE and HCTZ where discontinued until [facility] followup and rehydrated;Cr improved prior to discharge with fluids.  Lab or other results pending at discharge:  None  Labs needed at follow-up: Basic metabolic panel  Other follow-up issues:  1-Make sure patient symptoms has resolved. 2-Follow on colonoscopy appointments arrangements. 3-Restart if appropriate and needed her antihypertensive drugs.  Medication list changes:  Removed medication of OSCIMIN 0.125 MG TABS (HYOSCYAMINE SULFATE) Take one tablet by mouth three times a day ac Removed medication of TRAMADOL HCL 50 MG TABS (TRAMADOL HCL) 1 tablet every 6 hrs as needed for pain Changed medication from HYDROCHLOROTHIAZIDE 25 MG  TABS (HYDROCHLOROTHIAZIDE) Take 1 tablet by mouth once a day to HYDROCHLOROTHIAZIDE 25 MG  TABS (HYDROCHLOROTHIAZIDE) HOLD Changed medication from LISINOPRIL 40 MG  TABS (LISINOPRIL) Take 1 tablet by mouth once a day to LISINOPRIL 40 MG  TABS (LISINOPRIL) Hold Added new medication of CIPRO 250 MG TABS (CIPROFLOXACIN HCL) Take 1 tablet by mouth two times a day for 5 days. - Signed Rx of CIPRO 250 MG TABS (CIPROFLOXACIN HCL) Take 1 tablet by mouth two times a day for 5  days.;  #10 x 0;  Signed;  Entered by: Barton Dubois MD;  Authorized by: Epimenio Sarin MD;  Method used: Electronically to [facility] [phone]*, [address] [place], [place], [place]  [id number], Ph: [phone] or [phone], Fax: [phone]  The medication, problem, and allergy lists have been updated.  Please see the dictated discharge summary for details.  Discharge medications:  NORVASC 10 MG TABS (AMLODIPINE BESYLATE) Take 1 tablet by mouth once a day LISINOPRIL 40 MG  TABS (LISINOPRIL) Hold HYDROCHLOROTHIAZIDE 25 MG  TABS (HYDROCHLOROTHIAZIDE) HOLD ALBUTEROL 90 MCG/ACT  AERS (ALBUTEROL) Take 2 puffs every 4 hours as needed ADVAIR DISKUS 100-50 MCG/DOSE MISC (FLUTICASONE-SALMETEROL) Take 1 puff twice daily ATIVAN 1 MG TABS (LORAZEPAM) Take one tablet as needed for panic attack. PANTOPRAZOLE SODIUM 40 MG TBEC (PANTOPRAZOLE SODIUM) Take 1 tablet by mouth once a day CIPRO 250 MG TABS (CIPROFLOXACIN HCL) Take 1 tablet by mouth two times a day for 5 days.  Other patient instructions:  1-Please follow-up with Dr. Stanford Scotland on December 15th at [place] at the [facility]. 2-As discussed, increase your daily fiber and fluid intake.  3-If your symptoms worsen, please call the clinic at [phone] or go to the Emergency room. 4-Take tylenol 650mg by mouth every 6hrs as needed for pain. 5-Follow with your post-surgical attention. 6-Take your medications and  antibiotics asprescribed.  Note: Hospital Discharge Medications & Other Instructions handout was printed, one copy for patient and a second copy to be placed in hospital chart.

## 2010-04-22 NOTE — Progress Notes (Signed)
Summary: refill/ hla  Phone Note Refill Request Message from:  Fax from Pharmacy on January 14, 2007 12:19 PM  Refills Requested: Medication #1:  PRAVACHOL 10 MG TABS   Dosage confirmed as above?Dosage Confirmed Initial call taken by: Freddy Finner RN,  January 14, 2007 12:36 PM  Follow-up for Phone Call        When was this last filled. Patient needs to have lipids in 1-2 mos. We can schedule for visit at that time.  Follow-up by: Luane School MD,  January 14, 2007 2:40 PM  Additional Follow-up for Phone Call Additional follow up Details #1::        OK to refill.  Please schedule appt as above. Additional Follow-up by: Lucy Chris MD,  January 17, 2007 2:42 PM    Additional Follow-up for Phone Call Additional follow up Details #2::    flag to [facility] for appt Follow-up by: Freddy Finner RN,  January 18, 2007 9:09 AM  New/Updated Medications: PRAVACHOL 10 MG TABS (PRAVASTATIN SODIUM) Take 1 tablet by mouth once a day   Prescriptions: PRAVACHOL 10 MG TABS (PRAVASTATIN SODIUM) Take 1 tablet by mouth once a day  #31 x 1   Entered and Authorized by:   Lucy Chris MD   Signed by:   Lucy Chris MD on 01/17/2007   Method used:   Electronically sent to ...       [facility] [phone]*       [address]       [place], [place]  [address]       Ph: [phone] or [phone]       Fax: [phone]   RxID:   [id number]

## 2010-04-22 NOTE — Miscellaneous (Signed)
  Clinical Lists Changes Seen at the urgent care center on 11/24 because of persistance of wheezing and mild shortness of breath. She states that the advair caused chest pain when she tried it, so she stopped. She did take all of her other medications however, and while the flonase eased her congestion, and tussionex put her to sleep, her symptoms have persisted. I provided her with a prednisone taper, and to try taking the advair with a pharmacist instructing her (when she goes to pick up the prednisone.) She is to notify us if she still has a problem taking it, and if she does, switching to flovent diskus may be an option. She needs to be seen before 12/19.

## 2010-04-22 NOTE — Miscellaneous (Signed)
  Clinical Lists Changes  Problems: Added new problem of INVASIVE DUCTAL CARCINOMA, LEFT BREAST (ICD-174.9) Removed problem of LUMP OR MASS IN BREAST (ICD-611.72)

## 2010-04-22 NOTE — Assessment & Plan Note (Signed)
Summary: est-ck/fu/meds/cfb   Vital Signs:  Patient profile:   60 year old female Height:      68 inches (172.72 cm) Weight:      231.5 pounds (105.23 kg) BMI:     35.33 Temp:     97.5 degrees F (36.39 degrees C) oral Pulse rate:   95 / minute BP sitting:   157 / 86  (right arm) Cuff size:   regular  Vitals Entered By: Lucky Rathke NT II (July 06, 2008 3:52 PM) CC: BACK PAIN # 10+  / GAS  PAIN FOR ABOUT 3 WEEKS GETTING WORSE Is Patient Diabetic? No Pain Assessment Patient in pain? yes     Location: back Intensity:      10+ Type: aching Onset of pain  FOR ABOUT 3 WEEKS GETTING WORSE Nutritional Status BMI of > 30 = obese  Have you ever been in a relationship where you felt threatened, hurt or afraid?No   Does patient need assistance? Functional Status Self care Ambulation Normal Comments PAIN IN BACK GOING AROUND TO LOWER ABD/ PELVIC AREA  / HAVING GAS REAL BAD AND PAINFUL   Primary Care Provider:  Caroleen Hamman MD  CC:  BACK PAIN # 10+  / GAS  PAIN FOR ABOUT 3 WEEKS GETTING WORSE.  History of Present Illness: 14yof with pmh of HTN, HLD, depression who is here today with complaint of right back pain which has been going on for 2 weeks.  Pain is on and off, radiates to her groin and around the lower abdomen.  She can not sleep, it is driving her crazy.  She has had no fevers or chills.  No nausea or vomiting.  No change in her urine, no hematuria.  She has no hx of kidney stone or pyelo.  She has had no trauma to her back, no new activities.  Preventive Screening-Counseling & Management     Alcohol drinks/day: 0     Smoking Status: never     Passive Smoke Exposure: no     Does Patient Exercise: no  Current Medications (verified): 1)  Norvasc 10 Mg Tabs (Amlodipine Besylate) .... Take 1 Tablet By Mouth Once A Day 2)  Lisinopril 40 Mg  Tabs (Lisinopril) .... Take 1 Tablet By Mouth Once A Day 3)  Tramadol Hcl 50 Mg Tabs (Tramadol Hcl) .... One Pill Every 6 Hours As  Needed For Pain 4)  Hydrochlorothiazide 25 Mg  Tabs (Hydrochlorothiazide) .... Take 1 Tablet By Mouth Once A Day 5)  Albuterol 90 Mcg/act  Aers (Albuterol) .... Take 2 Puffs Every 4 Hours As Needed 6)  Advair Diskus 100-50 Mcg/dose Misc (Fluticasone-Salmeterol) .... Take 1 Puff Twice Daily 7)  Flonase 50 Mcg/act Susp (Fluticasone Propionate) .... Take 2 Sprays in Each Nostril Once Daily For 4 Days, and Then One Spray in Each Nostril Once Daily For A Week 8)  Pravachol 80 Mg Tabs (Pravastatin Sodium) .... Take One Tablet Daily.  Allergies: 1)  Lipitor  Review of Systems       per hpi  Physical Exam  General:  alert and overweight-appearing.   Lungs:  normal respiratory effort and normal breath sounds.   Heart:  normal rate, regular rhythm, and no murmur.   Abdomen:  soft and normal bowel sounds.  there is ttp in the lower left quadrant.  there is significant CVA tenderness on the right side and when laying flat pain is reproduced by lifting at the CVA area. Pulses:  2+ Extremities:  no edema  Neurologic:  alert & oriented X3 and cranial nerves II-XII intact.   Psych:  Oriented X3, memory intact for recent and remote, and normally interactive.     Impression & Recommendations:  Problem # 1:  FLANK PAIN, LEFT (ICD-789.09) I strongly suspect this is nephrolithiasis.  UA is completely negative, which can happen with stones (though usually there is some hematuria).  Clean urine is reassuring that this is not pyelo.  Will get renal US on monday to be sure there is no unilateral obstruction, to try to visualize stone, etc.  For now rec hydration and pain control with tylenol and tramadol.  Noted that she has mild renal insufficiency so no NSAIDS.  Orders: T-Urinalysis Dipstick only [id number]) T-Urinalysis [id number]) Ultrasound (Ultrasound)  Complete Medication List: 1)  Norvasc 10 Mg Tabs (Amlodipine besylate) .... Take 1 tablet by mouth once a day 2)  Lisinopril 40 Mg Tabs  (Lisinopril) .... Take 1 tablet by mouth once a day 3)  Tramadol Hcl 50 Mg Tabs (Tramadol hcl) .... One pill every 6 hours as needed for pain 4)  Hydrochlorothiazide 25 Mg Tabs (Hydrochlorothiazide) .... Take 1 tablet by mouth once a day 5)  Albuterol 90 Mcg/act Aers (Albuterol) .... Take 2 puffs every 4 hours as needed 6)  Advair Diskus 100-50 Mcg/dose Misc (Fluticasone-salmeterol) .... Take 1 puff twice daily 7)  Flonase 50 Mcg/act Susp (Fluticasone propionate) .... Take 2 sprays in each nostril once daily for 4 days, and then one spray in each nostril once daily for a week 8)  Pravachol 80 Mg Tabs (Pravastatin sodium) .... Take one tablet daily.  Patient Instructions: 1)  You most likely have a kidney stone.  You should continue to take tramadol for your pain.  You could also take tylenol 500mg  every 6 hours if your pain is not controled. 2)  You will have an ultrasound of your kidneys on Monday.

## 2010-04-22 NOTE — Consult Note (Signed)
[facility]: [facility]   Imported By: Bonner Puna 11/15/2008 16:06:43  _____________________________________________________________________  External Attachment:    Type:   Image     Comment:   External Document

## 2010-04-22 NOTE — Progress Notes (Signed)
Summary: Reaction  Phone Note Call from Patient   Caller: Patient Call For: Myrtis Ser MD Summary of Call: Call from pt.  Staes that she started Crestor on last Thursday.  Has been itching for 3 days after starting.  Pt said that she call the pharmacy to ask about a reaction was told to stop medication.  Pt took the last Crestor on last night is itching.  Pt was told by the pharmacist to take Benadryl.    Pt's only reaction is the itching.  Pt was advised not to take medication and we will contact her about getting something for her Cholesterol. Initial call taken by: Sander Nephew RN,  October 17, 2008 9:16 AM  Follow-up for Phone Call        Send this to Dr. Volanda Napoleon Follow-up by: Milta Deiters MD,  October 17, 2008 10:36 AM  Additional Follow-up for Phone Call Additional follow up Details #1::        Call to pt given information to stop Crestor.  Pt says that she has had no further itching since taking thew Beanadryl.  Pt will be called with a new medication after Dr. Volanda Napoleon returns. Additional Follow-up by: Sander Nephew RN,  October 17, 2008 4:10 PM   New Allergies: ! CRESTOR Additional Follow-up for Phone Call Additional follow up Details #2::    Have changed her to amega 3's.  She should not get statins any more as she has had several rxns.  will recheck lipds in 2-3 mo.  New/Updated Medications: RA FISH OIL 1000 MG CAPS (OMEGA-3 FATTY ACIDS) Take 3 capsules two times a day with food. New Allergies: ! CRESTORPrescriptions: RA FISH OIL 1000 MG CAPS (OMEGA-3 FATTY ACIDS) Take 3 capsules two times a day with food.  #200 x 3   Entered and Authorized by:   Myrtis Ser MD   Signed by:   Myrtis Ser MD on 10/25/2008   Method used:   Electronically to        [facility] [phone]* (retail)       [place], [place]  [id number]       Ph: [phone] or [phone]       Fax: [phone]   RxID:   [id number]

## 2010-04-22 NOTE — Assessment & Plan Note (Signed)
Summary: FLU/ SB  Nurse Visit    Prior Medications: PRAVACHOL 10 MG TABS (PRAVASTATIN SODIUM) Take 1 tablet by mouth once a day FLEXERIL 10 MG TAB (CYCLOBENZAPRINE HCL) Take one tablet by mouth three times a day NORVASC 10 MG TABS (AMLODIPINE BESYLATE) Take 1 tablet by mouth once a day LISINOPRIL 10 MG TABS (LISINOPRIL) Take 1 tablet by mouth once a day TRAMADOL HCL 50 MG TABS (TRAMADOL HCL) one pill every 6 hours as needed for pain LASIX 40 MG TABS (FUROSEMIDE) once daily tab in am KAY CIEL 20 MEQ PACK (POTASSIUM CHLORIDE) 2 pills daily  with food PRAVACHOL 20 MG TABS (PRAVASTATIN SODIUM) Take 1 tablet by mouth at bedtime Current Allergies: No known allergies    Influenza Vaccine    Vaccine Type: Fluvax Non-MCR    Site: left deltoid    Mfr: [employer]    Dose: 0.5 ml    Route: IM    Given by: Sander Nephew RN    Exp. Date: 09/20/2007    Lot #: [id number]    VIS given: 10/14/06  Flu Vaccine Consent Questions    Do you have a history of severe allergic reactions to this vaccine? no    Any prior history of allergic reactions to egg and/or gelatin? no    Do you have a sensitivity to the preservative Thimersol? no    Do you have a past history of Guillan-Barre Syndrome? no    Do you currently have an acute febrile illness? no    Have you ever had a severe reaction to latex? no    Vaccine information given and explained to patient? yes    Are you currently pregnant? no   Orders Added: 1)  Influenza Vaccine NON MCR [[id number]]    ]

## 2010-04-22 NOTE — Assessment & Plan Note (Signed)
Summary: Coughing,? URI Volanda Napoleon)   Vital Signs:  Patient Profile:   60 Years Old Female Height:     68 inches (172.72 cm) Weight:      235.9 pounds (107.23 kg) BMI:     36.00 Temp:     97.5 degrees F (36.39 degrees C) oral Pulse rate:   92 / minute BP sitting:   155 / 92  (right arm) Cuff size:   large  Pt. in pain?   yes    Location:   back    Intensity:      9  Vitals Entered By: Lucky Rathke NT II (February 09, 2008 10:30 AM)              Is Patient Diabetic? No Nutritional Status BMI of > 30 = obese  Have you ever been in a relationship where you felt threatened, hurt or afraid?No   Does patient need assistance? Functional Status Self care Ambulation Normal     PCP:  Caroleen Hamman MD  Chief Complaint:  ? URI- FOR ABOUT 1 MONTH//PATIENT STATES SHE HAS BEEN WHEEZING SINCE THE PFT TEST.  History of Present Illness: 60 y/o with PMH HTN, HLP and presents for a follow up of her breathing. Since her PFTs, she states that she has been wheezing non-stop. The albuterol does not have any effect she believes. It used to help however before the PFTs. She states that her breathing is not affected by anything. Nothing makes it worse, and it is a bit worse at night. There is associated coughing, but that occurs at any time. She feels a tickle in her throat that causes a persistant cough. She has not taken anything for her cough, and it is productive of clear fluid. She feels sinus drainage and drainage all the time. She also occassionally has diffuse teeth pain. She has not been having any chest pain, except when she coughs. She has not had any fevers or chills, and no nausea, vomiting, or diarrhea. She has no pets, and has not moved in the last year. She believes that she was told she had asthma by a private doctor, but she does not know what she was treated with.  PMH and SH reviewed and amended. PFTs obtained, showing the only change to be a greater than 25% decrease in FEV 25-75  following methylcholine challenge.    Updated Prior Medication List: PRAVASTATIN SODIUM 80 MG  TABS (PRAVASTATIN SODIUM) Take 1 tab by mouth at bedtime NORVASC 10 MG TABS (AMLODIPINE BESYLATE) Take 1 tablet by mouth once a day LISINOPRIL 40 MG  TABS (LISINOPRIL) Take 1 tablet by mouth once a day TRAMADOL HCL 50 MG TABS (TRAMADOL HCL) one pill every 6 hours as needed for pain HYDROCHLOROTHIAZIDE 25 MG  TABS (HYDROCHLOROTHIAZIDE) Take 1 tablet by mouth once a day ALBUTEROL 90 MCG/ACT  AERS (ALBUTEROL) Take 2 puffs every 4 hours as needed  Current Allergies: No known allergies     Risk Factors: Tobacco use:  never Drug use:  no Alcohol use:  no Exercise:  no Seatbelt use:  100 %  Colonoscopy History:    Date of Last Colonoscopy:  12/22/2002  Mammogram History:    Date of Last Mammogram:  02/10/2006   Review of Systems      See HPI   Physical Exam  General:     alert and well-developed.   Lungs:     Her lung exam is considerably worse. She has bilateral diffuse expiratory wheezing with prolonged  expiratory phase, and a dry cough. There are no focal rhonchi, and she is able to speak in full sentences. Heart:     normal rate, regular rhythm, and no murmur.   Abdomen:     soft, non-tender, and normal bowel sounds.   Neurologic:     alert & oriented X3, cranial nerves II-XII intact, and strength normal in all extremities.   Psych:     Oriented X3, memory intact for recent and remote, normally interactive, good eye contact, not anxious appearing, and not depressed appearing.      Impression & Recommendations:  Problem # 1:  URI (ICD-465.9) There may be a viral trigger to her asthma, but her large problem seems to be a sinusitis that her persisted for over a month, I think warranting a repeat treatment with azithromycin for 5 days, in addition to symptomatic treatment with cough aides, and a decongestant. Her updated medication list for this problem includes:     Tussionex Pennkinetic Er 8-10 Mg/54ml Lqcr (Chlorpheniramine-hydrocodone) .Marland Kitchen... Take one teaspoon by mouth once every 12 hours as needed for cough    Benzonatate 100 Mg Caps (Benzonatate) .Marland Kitchen... Take one capsule by mouth once every 4 hours as needed for cough   Problem # 2:  HYPERTENSION (ICD-401.9) Will see if her breathing improves, and then address this.  Her updated medication list for this problem includes:    Norvasc 10 Mg Tabs (Amlodipine besylate) .Marland Kitchen... Take 1 tablet by mouth once a day    Lisinopril 40 Mg Tabs (Lisinopril) .Marland Kitchen... Take 1 tablet by mouth once a day    Hydrochlorothiazide 25 Mg Tabs (Hydrochlorothiazide) .Marland Kitchen... Take 1 tablet by mouth once a day  BP today: 155/92 Prior BP: 158/92 (12/09/2007)  Prior 10 Yr Risk Heart Disease: 24 % (04/20/2006)  Labs Reviewed: Creat: 1.30 (12/09/2007) Chol: 231 (07/08/2007)   HDL: 46 (07/08/2007)   LDL: 156 (07/08/2007)   TG: 143 (07/08/2007)   Problem # 3:  COUGH VARIANT ASTHMA (ICD-493.82) Based on the PFTs, and more importantly her symptoms in front of me, I feel comfortable diagnosing Ms. Berthold with asthma, and her daily requirement for inhalers would classify it as uncontrolled. I will therefore try her on low dose advair, while trying to resolve her chronic sinusitis which has acted as her trigger. Her PFTs will be scanned into the chart. If her exam was normal when she presented to Dr. Olevia Bowens, than she has significantly worsened. SHe will return in early December for follow up and we will see if her treatment has been effective. Her updated medication list for this problem includes:    Albuterol 90 Mcg/act Aers (Albuterol) .Marland Kitchen... Take 2 puffs every 4 hours as needed    Advair Diskus 100-50 Mcg/dose Misc (Fluticasone-salmeterol) .Marland Kitchen... Take 1 puff twice daily   Complete Medication List: 1)  Pravastatin Sodium 80 Mg Tabs (Pravastatin sodium) .... Take 1 tab by mouth at bedtime 2)  Norvasc 10 Mg Tabs (Amlodipine besylate) .... Take 1  tablet by mouth once a day 3)  Lisinopril 40 Mg Tabs (Lisinopril) .... Take 1 tablet by mouth once a day 4)  Tramadol Hcl 50 Mg Tabs (Tramadol hcl) .... One pill every 6 hours as needed for pain 5)  Hydrochlorothiazide 25 Mg Tabs (Hydrochlorothiazide) .... Take 1 tablet by mouth once a day 6)  Albuterol 90 Mcg/act Aers (Albuterol) .... Take 2 puffs every 4 hours as needed 7)  Tussionex Pennkinetic Er 8-10 Mg/52ml Lqcr (Chlorpheniramine-hydrocodone) .... Take one teaspoon by mouth  once every 12 hours as needed for cough 8)  Benzonatate 100 Mg Caps (Benzonatate) .... Take one capsule by mouth once every 4 hours as needed for cough 9)  Zithromax 250 Mg Tabs (Azithromycin) .... Take 2 tabs by mouth today, and then one tab by mouth 10)  Advair Diskus 100-50 Mcg/dose Misc (Fluticasone-salmeterol) .... Take 1 puff twice daily 11)  Flonase 50 Mcg/act Susp (Fluticasone propionate) .... Take 2 sprays in each nostril once daily for 4 days, and then one spray in each nostril once daily for a week   Patient Instructions: 1)  Please return to the clinic in December so we can follow up on the effect of the medication changes. 2)  Contact the clinic if there is an issue with cost of the medications, and we will see if we can help out. 3)  It appears that you have asthma, and you will be treated accordingly. We will see if medications need to be changed at your next visit.   Prescriptions: ALBUTEROL 90 MCG/ACT  AERS (ALBUTEROL) Take 2 puffs every 4 hours as needed  #1 x prn   Entered and Authorized by:   Jacolyn Reedy MD   Signed by:   Jacolyn Reedy MD on 02/09/2008   Method used:   Print then Give to Patient   [id number] HYDROCHLOROTHIAZIDE 25 MG  TABS (HYDROCHLOROTHIAZIDE) Take 1 tablet by mouth once a day  #30 x 11   Entered and Authorized by:   Jacolyn Reedy MD   Signed by:   Jacolyn Reedy MD on 02/09/2008   Method used:   Print then Give to Patient   RxID:    [id number] LISINOPRIL 40 MG  TABS (LISINOPRIL) Take 1 tablet by mouth once a day  #30 x 11   Entered and Authorized by:   Jacolyn Reedy MD   Signed by:   Jacolyn Reedy MD on 02/09/2008   Method used:   Print then Give to Patient   [id number] [place] 10 MG TABS (AMLODIPINE BESYLATE) Take 1 tablet by mouth once a day  #30 x 11   Entered and Authorized by:   Jacolyn Reedy MD   Signed by:   Jacolyn Reedy MD on 02/09/2008   Method used:   Print then Give to Patient   [id number] PRAVASTATIN SODIUM 80 MG  TABS (PRAVASTATIN SODIUM) Take 1 tab by mouth at bedtime  #30 x 6   Entered and Authorized by:   Jacolyn Reedy MD   Signed by:   Jacolyn Reedy MD on 02/09/2008   Method used:   Print then Give to Patient   RxID:   [id number] FLONASE 50 MCG/ACT SUSP (FLUTICASONE PROPIONATE) Take 2 sprays in each nostril once daily for 4 days, and then one spray in each nostril once daily for a week  #2 wk supply x 3   Entered and Authorized by:   Jacolyn Reedy MD   Signed by:   Jacolyn Reedy MD on 02/09/2008   Method used:   Print then Give to Patient   [id number] ADVAIR DISKUS 100-50 MCG/DOSE MISC (FLUTICASONE-SALMETEROL) Take 1 puff twice daily  #1 mo. supply x 3   Entered and Authorized by:   Jacolyn Reedy MD   Signed by:   Jacolyn Reedy MD on 02/09/2008   Method used:   Print then Give to Patient   RxID:   [id number] ZITHROMAX 250 MG TABS (AZITHROMYCIN) Take 2 tabs by  mouth today, and then one tab by mouth  #6 x 0   Entered and Authorized by:   Jacolyn Reedy MD   Signed by:   Jacolyn Reedy MD on 02/09/2008   Method used:   Print then Give to Patient   RxID:   [id number] BENZONATATE 100 MG CAPS (BENZONATATE) Take one capsule by mouth once every 4 hours as needed for cough  #14 x 2   Entered and Authorized by:   Jacolyn Reedy MD   Signed by:   Jacolyn Reedy MD on 02/09/2008   Method used:   Print then Give to Patient   RxID:    [id number] [place] ER 8-10 MG/5ML LQCR (CHLORPHENIRAMINE-HYDROCODONE) Take one teaspoon by mouth once every 12 hours as needed for cough  #67ml x 0   Entered and Authorized by:   Jacolyn Reedy MD   Signed by:   Jacolyn Reedy MD on 02/09/2008   Method used:   Print then Give to Patient   RxID:   [id number]  ]

## 2010-04-22 NOTE — Assessment & Plan Note (Signed)
Summary: SEVERE ABD PAIN, 10/10 pain x 12hrs/pcp-devani/hla   Vital Signs:  Patient profile:   60 year old female Height:      66.5 inches (168.91 cm) Weight:      225.0 pounds (102.27 kg) BMI:     35.90 Temp:     98.8 degrees F oral Pulse rate:   99 / minute BP sitting:   134 / 72  (right arm) Cuff size:   regular  Vitals Entered By: Morrison Old RN (February 24, 2010 2:34 PM) CC: Abdominal pain and cramping; distended. Is Patient Diabetic? No Pain Assessment Patient in pain? yes     Location: abdomen Intensity: 9 Type: sharp Onset of pain  Constant Nutritional Status BMI of > 30 = obese  Have you ever been in a relationship where you felt threatened, hurt or afraid?Unable to ask; daughter w/pt.   Does patient need assistance? Functional Status Self care Ambulation Normal   Primary Care Provider:  Lester [place] MD  CC:  Abdominal pain and cramping; distended.Marland Kitchen  History of Present Illness: Patient returns with an acute abdominal pain 7/10 in intensity, cramping for 2 days, intermittent, worse with ROM ->worse after taking 2 tablets of Dulcolax "becasue felt bloated and consitpatied." reports hard stool since bilateral mastectomy on 02/04/2010. Denies any tenesmus, rectal pain or rectal bleed. No fever, chills, diaphoresis or recent weight changes. FMHX>m. grandfather with colon cancer at age 82 y/o. Patient had a colonoscopy in 2005 which was WNL.   Depression History:      The patient denies a depressed mood most of the day and a diminished interest in her usual daily activities.         Preventive Screening-Counseling & Management  Alcohol-Tobacco     Alcohol drinks/day: 0     Smoking Status: never     Passive Smoke Exposure: no  Caffeine-Diet-Exercise     Does Patient Exercise: no  Current Problems (verified): 1)  Abdominal Pain, Left Upper Quadrant  (ICD-789.02) 2)  External Hemorrhoids Without Mention Comp  (ICD-455.3) 3)  Invasive Ductal Carcinoma, Left  Breast  (ICD-174.9) 4)  Gerd  (ICD-530.81) 5)  Sleep Disorder  (ICD-780.50) 6)  Panic Attack  (ICD-300.01) 7)  Pulmonary Nodule  (ICD-518.89) 8)  Back Pain, Chronic  (ICD-724.5) 9)  Cough Variant Asthma  (ICD-493.82) 10)  Breast Pain, Bilateral  (ICD-611.71) 11)  Health Maintenance Exam  (ICD-V70.0) 12)  Renal Insufficiency, Mild  (A999333) 13)  Diastolic Dysfunction  (123XX123) 14)  U R I  (ICD-465.9) 15)  Family History of Colon Ca 1st Degree Relative <60  (ICD-V16.0) 16)  Family History of Cad Female 1st Degree Relative <60  (ICD-V16.49) 17)  Family Hsitory Breast Cancer 1st Degree Relative <50  (ICD-V16.3) 18)  Chest Pain, Atypical, Hx of  (ICD-V15.89) 19)  Hypertension  (ICD-401.9) 20)  Hyperlipidemia  (ICD-272.4) 21)  Depression  (ICD-311)  Medications Prior to Update: 1)  Norvasc 10 Mg Tabs (Amlodipine Besylate) .... Take 1 Tablet By Mouth Once A Day 2)  Lisinopril 40 Mg  Tabs (Lisinopril) .... Take 1 Tablet By Mouth Once A Day 3)  Hydrochlorothiazide 25 Mg  Tabs (Hydrochlorothiazide) .... Take 1 Tablet By Mouth Once A Day 4)  Albuterol 90 Mcg/act  Aers (Albuterol) .... Take 2 Puffs Every 4 Hours As Needed 5)  Advair Diskus 100-50 Mcg/dose Misc (Fluticasone-Salmeterol) .... Take 1 Puff Twice Daily 6)  Ativan 1 Mg Tabs (Lorazepam) .... Take One Tablet As Needed For Panic Attack. 7)  Pantoprazole  Sodium 40 Mg Tbec (Pantoprazole Sodium) .... Take 1 Tablet By Mouth Once A Day 8)  Tramadol Hcl 50 Mg Tabs (Tramadol Hcl) .Marland Kitchen.. 1 Tablet Every 6 Hrs As Needed For Pain 9)  Hydrocod Polst-Chlorphen Polst 10-8 Mg/68ml Lqcr (Hydrocod Polst-Chlorphen Polst) .... Take 1 Teaspoon Two Times A Day  Allergies (verified): 1)  ! Crestor 2)  Lipitor  Past History:  Risk Factors: Smoking Status: never (02/24/2010) Passive Smoke Exposure: no (02/24/2010)  Past medical, surgical, family and social histories (including risk factors) reviewed for relevance to current acute and chronic  problems.  Past Medical History: Reviewed history from 05/04/2006 and no changes required. Depression-seasonal melancholia Hyperlipidemia Hypertension, stage II Back pain with degenerative end plates, lumbar spondylosis Myopathy etiology unclear, not statin induced  Past Surgical History: Reviewed history from 10/22/2006 and no changes required. Hysterectomy for fibroids Cervical laminectomy c5-c6, c4-c5 left  nitka 2005 Oophorectomy  Family History: Reviewed history from 04/20/2006 and no changes required. Family History of breast cancer 1st degree relative, Mom died at age 69 Family History of CAD Female 1st degree relative <60 Family History of Colon CA 1st degree relative, Father died at age 59  Social History: Reviewed history from 07/31/2008 and no changes required. Single, 2 kids never smoker Alcohol use-no Drug use-no Laid off from [employer] after 32yrs   Physical Exam  General:  alert, well-developed, well-nourished, and well-hydrated.   Head:  normocephalic and atraumatic.   Eyes:  no icterus or pallor bilaterally Mouth:  dry mucosa Neck:  supple and full ROM.   Breasts:  sp bilateral mastectomy scars with right  JP bulb with small amount (20 cc of sanguinous discharge noted). Otherwise, no erythema, edema or drainage; mastectomy wound edges well approximated. Lungs:  normal respiratory effort, no intercostal retractions, no accessory muscle use, normal breath sounds, no dullness, no fremitus, no crackles, and no wheezes.   Heart:  normal rate, regular rhythm, no murmur, no gallop, no rub, and no JVD.   Abdomen:  obese, BS hypoactive; soft, no guarding; acute LEQ and RLQ abdominal TTP noted; no rebound. Rectal:  No changes since last exam Msk:  normal ROM, no joint tenderness, and no joint swelling.   Pulses:  2+pulses b/l Extremities:  no edema, no clubbing Neurologic:  alert & oriented X3.   Skin:  turgor normal, color normal, no rashes, and no  ulcerations.   Inguinal Nodes:  no R inguinal adenopathy and no L inguinal adenopathy.   Psych:  Oriented X3, memory intact for recent and remote, good eye contact, not anxious appearing, and not depressed appearing.     Impression & Recommendations:  Problem # 1:  ABDOMINAL PAIN, LEFT UPPER QUADRANT (ICD-789.02) Assessment New Unclear etiology-> no obstruction or constipation per XR of abdomen; WBC is WNL  therefore infcious process is unlikel; diverticuloses ->FOBT neg; IBS?Marland Kitchen Spoke with Dr. Eppie Gibson -> will admit for 23 hour observation. Will keep NPO, check C-met, CT of abdomen. Dr. Dyann Kief with admitting team is notified. Discussed symptom control with the patient.   Orders: T-Urinalysis Dipstick only [id number]) Diagnostic X-Ray/Fluoroscopy (Diagnostic X-Ray/Flu) T-CBC No Diff [id number])  Complete Medication List: 1)  Norvasc 10 Mg Tabs (Amlodipine besylate) .... Take 1 tablet by mouth once a day 2)  Lisinopril 40 Mg Tabs (Lisinopril) .... Take 1 tablet by mouth once a day 3)  Hydrochlorothiazide 25 Mg Tabs (Hydrochlorothiazide) .... Take 1 tablet by mouth once a day 4)  Albuterol 90 Mcg/act Aers (Albuterol) .... Take 2 puffs  every 4 hours as needed 5)  Advair Diskus 100-50 Mcg/dose Misc (Fluticasone-salmeterol) .... Take 1 puff twice daily 6)  Ativan 1 Mg Tabs (Lorazepam) .... Take one tablet as needed for panic attack. 7)  Pantoprazole Sodium 40 Mg Tbec (Pantoprazole sodium) .... Take 1 tablet by mouth once a day 8)  Tramadol Hcl 50 Mg Tabs (Tramadol hcl) .Marland Kitchen.. 1 tablet every 6 hrs as needed for pain 9)  Oscimin 0.125 Mg Tabs (Hyoscyamine sulfate) .... Take one tablet by mouth three times a day ac  Patient Instructions: 1)  Please, call with any questions. 2)  Please, follow on as needed basis. Prescriptions: OSCIMIN 0.125 MG TABS (HYOSCYAMINE SULFATE) Take one tablet by mouth three times a day ac  #90 x 0   Entered and Authorized by:   Milana Obey MD   Signed by:   Milana Obey MD on 02/24/2010   Method used:   Electronically to        [facility] [phone]* (retail)       [place], [place]  [id number]       Ph: [phone] or [phone]       Fax: [phone]   RxID:   [id number]    Orders Added: 1)  T-Urinalysis Dipstick only [id number] 2)  Est. Patient Level IV [id number] 3)  Diagnostic X-Ray/Fluoroscopy [Diagnostic X-Ray/Flu] 4)  T-CBC No Diff [id number]    Prevention & Chronic Care Immunizations   Influenza vaccine: Historical  (12/30/2009)   Influenza vaccine due: 11/22/2010    Tetanus booster: 02/20/2002: Historical   Tetanus booster due: 02/21/2012    Pneumococcal vaccine: Historical  (02/21/2004)   Pneumococcal vaccine due: 02/20/2009    H. zoster vaccine: Not documented   H. zoster vaccine deferral: Refused  (10/15/2009)  Colorectal Screening   Hemoccult: Negative  (03/03/2005)   Hemoccult action/deferral: Not indicated  (10/10/2008)    Colonoscopy: Normal  (12/22/2002)   Colonoscopy action/deferral: scheduled with G.I.  (05/04/2006)   Colonoscopy due: 12/21/2012  Other Screening   Pap smear: Not documented   Pap smear action/deferral: Not indicated S/P hysterectomy  (10/10/2008)    Mammogram: BI-RADS CATEGORY 1:  Negative.^MM DIGITAL SCREENING UNILAT R  (01/14/2010)   Mammogram due: 03/12/2010    DXA bone density scan: Not documented   Smoking status: never  (02/24/2010)  Lipids   Total Cholesterol: 258  (04/26/2009)   Lipid panel action/deferral: Lipid Panel ordered   LDL: 177  (04/26/2009)   LDL Direct: Not documented   HDL: 42  (04/26/2009)   Triglycerides: 193  (04/26/2009)    SGOT (AST): 18  (04/26/2009)   BMP action: Ordered   SGPT (ALT): 17  (04/26/2009)   Alkaline phosphatase: 58  (04/26/2009)   Total bilirubin: 0.5  (04/26/2009)  Hypertension   Last Blood Pressure: 134 / 72  (02/24/2010)   Serum creatinine: 1.17  (04/26/2009)   BMP  action: Ordered   Serum potassium 3.9  (04/26/2009)  Self-Management Support :   Personal Goals (by the next clinic visit) :      Personal blood pressure goal: 140/90  (04/26/2009)     Personal LDL goal: 100  (04/26/2009)    Hypertension self-management support: Written self-care plan  (02/21/2010)    Lipid self-management support: Written self-care plan  (02/21/2010)   Process Orders Check Orders Results:     [facility]: Check successful Tests Sent for  requisitioning (February 24, 2010 4:44 PM):     02/24/2010: [facility] -- T-CBC No Diff [id number] (signed)     Laboratory Results   Urine Tests  Date/Time Recieved: 02/24/10 1500PM Date/Time Reported: 02/24/10  1500PM  Routine Urinalysis   Color: lt. yellow Glucose: negative   (Normal Range: Negative) Bilirubin: negative   (Normal Range: Negative) Ketone: trace (5)   (Normal Range: Negative) Spec. Gravity: 1.015   (Normal Range: 1.003-1.035) Blood: negative   (Normal Range: Negative) pH: 5.0   (Normal Range: 5.0-8.0) Protein: negative   (Normal Range: Negative) Urobilinogen: 0.2   (Normal Range: 0-1) Nitrite: positive   (Normal Range: Negative) Leukocyte Esterace: trace   (Normal Range: Negative)

## 2010-04-22 NOTE — Assessment & Plan Note (Signed)
Summary: est-ck/fu/med/cfb   Vital Signs:  Patient profile:   60 year old female Height:      66.5 inches (168.91 cm) Weight:      221.9 pounds (100.86 kg) BMI:     35.41 Temp:     98.7 degrees F (37.06 degrees C) oral Pulse rate:   84 / minute BP sitting:   129 / 85  (right arm) Cuff size:   large  Vitals Entered By: Nadine Counts Deborra Medina) (April 26, 2009 2:43 PM) CC: pt stopped taking crestor 1.74mths  2/2 abd discomfort, wheezing at night,  Is Patient Diabetic? No Pain Assessment Patient in pain? no      Nutritional Status BMI of > 30 = obese  Have you ever been in a relationship where you felt threatened, hurt or afraid?No   Does patient need assistance? Functional Status Self care Ambulation Normal   Primary Care Provider:  Caroleen Hamman MD  CC:  pt stopped taking crestor 1.74mths  2/2 abd discomfort, wheezing at night, and .  History of Present Illness: This is a 60 year old woman with past medical history outlined in this chart.  She is here for a check up.  She has no current complaints.  Depression History:      The patient denies a depressed mood most of the day and a diminished interest in her usual daily activities.         Preventive Screening-Counseling & Management  Alcohol-Tobacco     Alcohol drinks/day: 0     Smoking Status: never     Passive Smoke Exposure: no  Current Medications (verified): 1)  Norvasc 10 Mg Tabs (Amlodipine Besylate) .... Take 1 Tablet By Mouth Once A Day 2)  Lisinopril 40 Mg  Tabs (Lisinopril) .... Take 1 Tablet By Mouth Once A Day 3)  Hydrochlorothiazide 25 Mg  Tabs (Hydrochlorothiazide) .... Take 1 Tablet By Mouth Once A Day 4)  Albuterol 90 Mcg/act  Aers (Albuterol) .... Take 2 Puffs Every 4 Hours As Needed 5)  Advair Diskus 100-50 Mcg/dose Misc (Fluticasone-Salmeterol) .... Take 1 Puff Twice Daily  Allergies: 1)  ! Crestor 2)  Lipitor  Review of Systems       per hpi  Physical Exam  General:  alert and  overweight-appearing.   Head:  normocephalic and atraumatic.   Eyes:  vision grossly intact, pupils equal, pupils round, and pupils reactive to light.   Mouth:  good dentition and pharynx pink and moist.   Lungs:  normal respiratory effort and normal breath sounds.   Heart:  normal rate, regular rhythm, and no murmur.   Pulses:  +2 Extremities:  no edema Neurologic:  alert & oriented X3, cranial nerves II-XII intact, and strength normal in all extremities.   Psych:  Oriented X3, memory intact for recent and remote, normally interactive, good eye contact, and slightly anxious.     Impression & Recommendations:  Problem # 1:  HYPERTENSION (ICD-401.9) BP is fine today, no changes.  Her updated medication list for this problem includes:    Norvasc 10 Mg Tabs (Amlodipine besylate) .Marland Kitchen... Take 1 tablet by mouth once a day    Lisinopril 40 Mg Tabs (Lisinopril) .Marland Kitchen... Take 1 tablet by mouth once a day    Hydrochlorothiazide 25 Mg Tabs (Hydrochlorothiazide) .Marland Kitchen... Take 1 tablet by mouth once a day  BP today: 129/85 Prior BP: 137/80 (10/10/2008)  Prior 10 Yr Risk Heart Disease: 24 % (04/20/2006)  Labs Reviewed: K+: 4.2 (09/12/2008) Creat: :  1.57 (09/12/2008)   Chol: 218 (09/12/2008)   HDL: 45 (09/12/2008)   LDL: 131 (09/12/2008)   TG: 208 (09/12/2008)  Problem # 2:  RENAL INSUFFICIENCY, MILD (ICD-588.9) Will check renal function and electrolytes today.  Orders: T-Lipid Profile [id number])  Problem # 3:  HYPERLIPIDEMIA (ICD-272.4) has taken herself on and then off of crestor.  will recheck lipids to day and not restart statins as she is intolerant.   Orders: T-Lipid Profile [id number])  Labs Reviewed: SGOT: 17 (09/12/2008)   SGPT: 17 (09/12/2008)  Prior 10 Yr Risk Heart Disease: 24 % (04/20/2006)   HDL:45 (09/12/2008), 47 (03/21/2008)  LDL:131 (09/12/2008), 125 (03/21/2008)  Chol:218 (09/12/2008), 210 (03/21/2008)  Trig:208 (09/12/2008), 190 (03/21/2008)  Problem # 4:  PANIC  ATTACK (ICD-300.01) She has been having occasional panic attacks.  She recieved a prescription from the ED for this, recieved only 10 or so tablets and still has a few left.  She does not take them often (<10 over past months) but feels very attached to this prescription, is worried she might run out.  She will call and let me know what the prescription is for and I will be happy to refill it.  Problem # 5:  SLEEP DISORDER (ICD-780.50)  new snoring and waking at night, also with wheezing.  She reports that she is falling asleep during the day, has even nodded off while driving.  This is worrysome, will get sleep study to eval for OSA.  Orders: Split Night (Split Night)  Complete Medication List: 1)  Norvasc 10 Mg Tabs (Amlodipine besylate) .... Take 1 tablet by mouth once a day 2)  Lisinopril 40 Mg Tabs (Lisinopril) .... Take 1 tablet by mouth once a day 3)  Hydrochlorothiazide 25 Mg Tabs (Hydrochlorothiazide) .... Take 1 tablet by mouth once a day 4)  Albuterol 90 Mcg/act Aers (Albuterol) .... Take 2 puffs every 4 hours as needed 5)  Advair Diskus 100-50 Mcg/dose Misc (Fluticasone-salmeterol) .... Take 1 puff twice daily  Other Orders: T-Comprehensive Metabolic Panel ([id number])  Patient Instructions: 1)  You had labwork done today.  We will call you with results. 2)  Please call and let me know what pills you are taking for panic attacks. 3)  We will set up a sleep study for you. 4)  You should put a filter over your heating vent to prevent dust. Prescriptions: ADVAIR DISKUS 100-50 MCG/DOSE MISC (FLUTICASONE-SALMETEROL) Take 1 puff twice daily  #1 mo. supply x 12   Entered and Authorized by:   Myrtis Ser MD   Signed by:   Myrtis Ser MD on 04/26/2009   Method used:   Electronically to        [facility] [phone]* (retail)       [place], [place]  [id number]       Ph: [phone] or [phone]       Fax:  [phone]   RxIDMO:[id number] ALBUTEROL 90 MCG/ACT  AERS (ALBUTEROL) Take 2 puffs every 4 hours as needed  #1 x prn   Entered and Authorized by:   Myrtis Ser MD   Signed by:   Myrtis Ser MD on 04/26/2009   Method used:   Electronically to        [facility] [phone]* (retail)       [address]  [place], [place]  [id number]       Ph: [phone] or [phone]       Fax: [phone]   RxID:   [id number]  Process Orders Check Orders Results:     [facility]: Check successful Tests Sent for requisitioning (May 02, 2009 8:12 PM):     04/26/2009: [facility] -- T-Lipid Profile [id number] (signed)     04/26/2009: [facility] -- T-Comprehensive Metabolic Panel [id number] (signed)    Prevention & Chronic Care Immunizations   Influenza vaccine: Historical  (02/20/2009)    Tetanus booster: 02/20/2002: Historical    Pneumococcal vaccine: Historical  (02/21/2004)    H. zoster vaccine: Not documented  Colorectal Screening   Hemoccult: Negative  (03/03/2005)   Hemoccult action/deferral: Not indicated  (10/10/2008)    Colonoscopy: Normal  (12/22/2002)   Colonoscopy action/deferral: scheduled with G.I.  (05/04/2006)   Colonoscopy due: 12/21/2012  Other Screening   Pap smear: Not documented   Pap smear action/deferral: Not indicated S/P hysterectomy  (10/10/2008)    Mammogram: BI-RADS CATEGORY 2:  Benign finding(s).^MM DIGITAL DIAG LTD L  (04/12/2009)   Mammogram due: 03/12/2010    DXA bone density scan: Not documented   Smoking status: never  (04/26/2009)  Lipids   Total Cholesterol: 218  (09/12/2008)   Lipid panel action/deferral: Lipid Panel ordered   LDL: 131  (09/12/2008)   LDL Direct: Not documented   HDL: 45  (09/12/2008)   Triglycerides: 208  (09/12/2008)    SGOT (AST): 17  (09/12/2008)   BMP action: Ordered   SGPT (ALT): 17   (09/12/2008) CMP ordered    Alkaline phosphatase: 59  (09/12/2008)   Total bilirubin: 0.5  (09/12/2008)    Lipid flowsheet reviewed?: Yes   Progress toward LDL goal: Unchanged  Hypertension   Last Blood Pressure: 129 / 85  (04/26/2009)   Serum creatinine: 1.57  (09/12/2008)   BMP action: Ordered   Serum potassium 4.2  (09/12/2008) CMP ordered     Hypertension flowsheet reviewed?: Yes   Progress toward BP goal: Improved  Self-Management Support :   Personal Goals (by the next clinic visit) :      Personal blood pressure goal: 140/90  (04/26/2009)     Personal LDL goal: 100  (04/26/2009)    Patient will work on the following items until the next clinic visit to reach self-care goals:     Medications and monitoring: take my medicines every day  (04/26/2009)     Eating: eat more vegetables, eat foods that are low in salt, eat baked foods instead of fried foods  (04/26/2009)     Activity: take a 30 minute walk every day  (04/26/2009)    Hypertension self-management support: Not documented    Lipid self-management support: Not documented     Immunization History:  Influenza Immunization History:    Influenza:  historical (02/20/2009)

## 2010-04-22 NOTE — Progress Notes (Signed)
Summary: Congestion  Phone Note Call from Patient   Caller: Patient Summary of Call: Patient walked in today.  C/O of stuffiness with a cough.  No fevers or chills.  Worse at night.  Has been using N/S drops with no relief.  Pt said that she has clear mucous from her nostrils.  Michela Pitcher that this has been going on for more than 2 weeks.  Is using her Inhalers as ordered.  Pt was recently diagnosied with Breast Cancer ans is having surgery on the 15th.  Vey concerned about medications that she will be able to take for the congestion.  Said that she received a liquid about 2 years ago for the same problem that worked well. Sander Nephew RN  January 27, 2010 9:02 AM  Initial call taken by: Sander Nephew RN,  January 27, 2010 9:02 AM  Follow-up for Phone Call        Dr. Lamar Benes will see today. Follow-up by: Rhea Pink  DO,  January 27, 2010 10:35 AM

## 2010-04-22 NOTE — Assessment & Plan Note (Signed)
Summary: (ACUTE-TULLO)PER RODRIGUEZ 2 WEEK F/U FOR FOR BACK PAIN AND B...   Vital Signs:  Patient Profile:   60 Years Old Female Weight:      232.4 pounds (105.64 kg) Temp:     99.1 degrees F (37.28 degrees C) oral Pulse rate:   103 / minute BP sitting:   155 / 96  (right arm)  Vitals Entered By: Nadine Counts Deborra Medina) (May 04, 2006 8:36 AM)             Is Patient Diabetic? No Nutritional Status Normal  Have you ever been in a relationship where you felt threatened, hurt or afraid?No   Does patient need assistance? Functional Status Self care Ambulation Impaired:Risk for fall Comments uses a cane   Chief Complaint:  mri results, bp check, and and lower abd pain (pt thinks it's an UTI).  History of Present Illness: Pt is a 60 year old AA woman with a history of stage II HTN, hyperlipidemia, mood disorder and lumbar radiculopathy who comes to the clinic today to follow up on her blood pressure and back pain. She was seen by Dr. Norma Fredrickson 2 weeks ago at which time she was referred to PT, prescribed Flexeril and scheduled for an MRI for her back pain. Pt feels better than she did on her last visit (pain down from 10/10 to 7/10) however she still has the same left lateral lumbar pain radiating down below her knee with numbness on the lateral part of her left foot. Her MRI revealed: 1-Tiny focal soft disc herniation at L5-S1 on the right slightly deviating the right S-1 nerve root. 2-Mild degenerative disc disese at L3-L4 and L4-L5 without focal nerve root impingement. There is a far lateral annular tear at L4-5 extending into the right neural foramen but without impingement. Pt has been experiencing this pain since the early 1990s and has had numbness for about 2 years now. She has not been evaluated by a neurosurgeon and is not interested in getting surgery because several of her family members still had significant pain after lumbar surgery. Patient also readdresses the lower  abdominal discomfort she's been experiencing especially in the past few weeks. About 8-9 months ago, she started experiencing urinary incontinence (she doesn't feel it happening; she just noticed her underwear was wet). When she had her hysterectomy in the 1980s, they also ''lifted up'' her bladder because she was having similar issues. Patient now also feels pressure in her lower abdomen (left and right side). Denies dysuria, pollakiuria and hematuria but does complain of frequency. No hesitancy. No fever or chills, no flank pain. Has not been sexually active since 1995. Pt is overdue for her Pap smear and will have it done by Dr. Derrel Nip when she next sees her. Her colonoscopy is due next year. Her mammogram is due in 01/2007.   Back Pain History:      The patient's back pain has been present for > 6 weeks.  The pain is located in the lower back region and does radiate below the knees.  She states this is not work related.  On a scale of 1-10, she describes the pain as a 7.  She states that she has had a prior history of back pain.  The patient has had a recent course of physical therapy.  The following makes the back pain better: heating pads.  The following makes the back pain worse: bending, stretching.    Critical Exclusionary Diagnosis Criteria (CEDC) for Back Pain:  Cancer risk factors include no improvement in low back pain after 4-6 weeks therapy.  Symptoms to suggest the possibility of cauda equina syndrome include sensory deficit of lower extremity.  Possible psychosocial cause(s) for her back pain include failure of previous therapy and disability compensation-current or pending.     Prior Medications (reviewed today): PRAVACHOL 10 MG TABS (PRAVASTATIN SODIUM)  FLEXERIL 10 MG TAB (CYCLOBENZAPRINE HCL) Take one tablet by mouth three times a day NORVASC 10 MG TABS (AMLODIPINE BESYLATE) Take 1 tablet by mouth once a day HYDROCHLOROTHIAZIDE 25 MG TAB (HYDROCHLOROTHIAZIDE) Take 1 tablet by  mouth once a day Current Allergies: No known allergies   Past Medical History:    Depression-seasonal melancholia    Hyperlipidemia    Hypertension, stage II    Back pain with degenerative end plates, lumbar spondylosis    Myopathy etiology unclear, not statin induced    Risk Factors:  Mammogram History:     Date of Last Mammogram:  02/10/2006    Results:  Normal Bilateral    Review of Systems  General      Denies chills, fatigue, fever, loss of appetite, sweats, and weight loss.  CV      Complains of palpitations.      Denies chest pain or discomfort and fainting.  Resp      Complains of shortness of breath.      Denies chest discomfort, cough, coughing up blood, sputum productive, and wheezing.      Feels chest pressure associated with anxiety.  GI      Denies abdominal pain, bloody stools, constipation, dark tarry stools, diarrhea, nausea, and vomiting.  GU      See HPI  MS      Denies loss of strength and muscle weakness.  Neuro      See HPI.  Psych      Complains of anxiety.      Denies depression and irritability.   Physical Exam  General:     NAD, limping Head:     normocephalic and atraumatic.   Eyes:     vision grossly intact, pupils equal, pupils round, pupils reactive to light, and pupils react to accomodation.   Mouth:     OP clear, MMM. Neck:     Supple, no thyromegaly. Lungs:     CTAB with good air mvt Heart:     RRR, no m/r/g. Abdomen:     Obese, pale striae, BS+, soft, mild TTP of L iliac fossa with guarding or reboud tenderness. No palpable masses or organomegaly. Msk:     Decreased ROM in back. No joint tenderness, erythema or edema.  Pulses:     Posterior tibial normal bilaterally. Extremities:     2 + pitting edema of feet, no cyanosis or clubbing. Neurologic:     DTRs symmetrical and normal.   Cervical Nodes:     L anterior LN enlarged (submandibular) but non tender, well defined, not erythematous or warm and not  attached (can be moved).    Impression & Recommendations:  Problem # 1:  LUMBOSACRAL RADICULOPATHY (ICD-724.4) I am glad that pt's pain has decreased to 7/10 on its own. It seems that her MRI does reveal a combination of findings that explain her pain and paresthesias (S1 distribution). I have explained the findings to pt. She does not want to be referred to a neurosurgeon yet because she is scared of getting back surgery; several of her family members had back surgery and had worsened pain afterwards.  I have recommended that she think about it. However, I am concerned that her paresthesia might be a chronic issue since it has now been present for the past 8-9 months. I also suspect that surgery would not be a good option for her since she has had this radiculopathy since the 1990s. I have encouraged to continue her PT and to try to remain active. I have encouraged her to continue applying heat since it seems to be helping her. I have written for another month of Flexeril because she feels it helps relieve her pain and I suspect a significant component of her pain is 2nd to muscular contracture. She should return to see [place] if she develops bowel incontinence, saddle anesthesia or weakness in her leg (cauda equina signs).  Her updated medication list for this problem includes:    Flexeril 10 Mg Tab (Cyclobenzaprine hcl) .Marland Kitchen... Take one tablet by mouth three times a day   Problem # 2:  INCONTINENCE, URGE (ICD-788.31) Although pt's urinary incontinence could be worrisome given her lumbar radiculopathy, this problem has been an issue for many months now. In fact, her incontinence is a recurrent problem; pt had it in the 1970-80s before she had gynecological surgery. The results from her urinalysis are not in favor of a UTI and she has no signs of infection either. Will still culture her urine to be safe. Have taught pt how to do Kegel exercises and recommended she do them about 30 times holding the  contraction for as long as she can, as often as she remembers during the day. Pt preferred to have Dr. Derrel Nip do her pelvic exam so I was unable to verify whether or not she has any prolapse. When her back pain has improved, I would refer pt to her gynecologist to see if any further intervention would benefit her.  Orders: T-Urinalysis Dipstick only [id number]) T-Urinalysis [id number]) T-Culture, Urine [id number])   Problem # 3:  HYPERTENSION (ICD-401.9) Pt's BP is much improved since her last visits but still elevated at 155/96. Looking at her chart, it seems pt has had persistent stage II HTN despite treatment; I am glad that she has responded to the HCTZ and Norvasc. I suspect that her current HTN ''exacerbation" is 2nd to all the pain she is experiencing.  Of note: pt's creatinine back to baseline of 1.2 after having increased to 1.5 on Benazepril. I have started her on a low dose of lisinopril today to see if if her creatinine reincreases. If it does, we will need to d/c the lisinopril and investigate whether or not patient has bilateral renal artery stenosis. Will check a BMET today (2 weeks after HCTZ was started) and microalbuminuria to document. Will recheck a BMET in 1 week to see how pt's creatinine has reacted to the ACEI and to monitor her K+.  Her updated medication list for this problem includes:    Norvasc 10 Mg Tabs (Amlodipine besylate) .Marland Kitchen... Take 1 tablet by mouth once a day    Hydrochlorothiazide 25 Mg Tab (Hydrochlorothiazide) .Marland Kitchen... Take 1 tablet by mouth once a day    Lisinopril 10 Mg Tabs (Lisinopril) .Marland Kitchen... Take 1 tablet by mouth once a day  Orders: T-Basic Metabolic Panel ([id number]) T-Urine Microalbumin w/creat. ratio [phone] / [id number])  Future Orders: T-Basic Metabolic Panel ([id number]) ... 05/11/2006   Problem # 4:  HYPERLIPIDEMIA (ICD-272.4) Pt was started on a statin in mid November 2007. Although her CMET was checked at her last visit to monitor her  LFTs,  a repeat FLP has not been done. An FLP should be scheduled on her next visit since it will have been 3 months since she was started on 10 mg of Pravachol. No myalgias.  Her updated medication list for this problem includes:    Pravachol 10 Mg Tabs (Pravastatin sodium)   Problem # 5:  Preventive Health Care (ICD-V70.0) Pap smear to be done on pt's next apptmt with Dr. Derrel Nip.   Medications Added to Medication List This Visit: 1)  Lisinopril 10 Mg Tabs (Lisinopril) .... Take 1 tablet by mouth once a day  Colorectal Screening:  Current Recommendations:    Colonoscopy recommended: scheduled with G.I.  PAP Screening:    Reviewed PAP smear recommendations:  patient refuses understanding risks of delayed diagnosis  PAP Smear Results:  PAP Smear Comments:    Patient prefers to have Pap smear done by Dr. Derrel Nip, her primary care physician.  Mammogram Screening:    Last Mammogram:  02/10/2006  Mammogram Results:    Date of Exam:  02/10/2006    Results:  Normal Bilateral  Osteoporosis Risk Assessment:  Risk Factors for Fracture or Low Bone Density:   Smoking status:       never  Immunization & Chemoprophylaxis:    Tetanus vaccine: Historical  (02/20/2002)    Influenza vaccine: Historical  (02/01/2006)    Pneumovax: Historical  (02/21/2004) Prescriptions: LISINOPRIL 10 MG TABS (LISINOPRIL) Take 1 tablet by mouth once a day  #30 x 12   Entered and Authorized by:   Burton Apley MD   Signed by:   Burton Apley MD on 05/04/2006   Method used:   Handwritten   RxIDER:[id number]    Patient Instructions: 1)  Please schedule a follow-up appointment in 2 weeks with Dr.Tullo your primary care physician. At that time, your blood pressure will be checked and some blood work will be done. 2)  You have been started on a new medication called Lisinopril (10 mg tabs) of which you will take one tab by mouth daily. Please go the nearest emergency room if you experience swelling  of your throat or tongue. This medication may make you cough. 3)  Plan to have a Pap smear done on your next visit.  Laboratory Results  Date/Time Recieved: May 04, 2006 9:45 AM Date/Time Reported: May 04, 2006 9:54 AM   Urine Tests   Routine Urinalysis    Urine Microscopic Color: yellow Appearance: clear Glucose: negative   (Normal Range: Negative) Bilirubin: negative   (Normal Range: Negative) Ketone: negative   (Normal Range: Negative) Spec. Gravity: 1.015   (Normal Range: 1.003-1.035) Blood: negative   (Normal Range: Negative) pH: 5.0   (Normal Range: 5.0-8.0) Protein: negative   (Normal Range: Negative) Urobilinogen: 0.2   (Normal Range: 0-1) Nitrite: negative   (Normal Range: Negative) Leukocyte Esterace: negative   (Normal Range: Negative)      Blood Tests     Other Tests   Appended Document: (ACUTE-TULLO)PER RODRIGUEZ 2 WEEK F/U FOR FOR BACK PAIN AND B... Urine culture negative as would have been expected.

## 2010-04-22 NOTE — Letter (Signed)
Summary: Handout Printed  Printed Handout:  - *Patient Instructions 

## 2010-04-22 NOTE — Assessment & Plan Note (Signed)
Summary: REASSIGNED NEW TO DR/CFB   Vital Signs:  Patient Profile:   60 Years Old Female Height:     68 inches (172.72 cm) Weight:      234.8 pounds (106.73 kg) BMI:     35.83 Temp:     98.3 degrees F (36.83 degrees C) oral Pulse rate:   96 / minute BP sitting:   144 / 80  (right arm)  Pt. in pain?   no  Vitals Entered By: Silverio Decamp NT II (November 07, 2007 2:17 PM)              Is Patient Diabetic? No Nutritional Status BMI of 25 - 29 = overweight  Have you ever been in a relationship where you felt threatened, hurt or afraid?No   Does patient need assistance? Functional Status Self care Ambulation Normal     PCP:  Caroleen Hamman MD  Chief Complaint:  REASSIGN .  History of Present Illness: This is a 60 year old woman with past medical history of   Depression-seasonal melancholia Hyperlipidemia Hypertension, stage II Back pain with degenerative end plates, lumbar spondylosis Myopathy etiology unclear, not statin induced  She is here today after going to the ED yesterday.  CXR showed RLL PNA she was given zithromax (1g once) and prednisone (40mg  for 5 days).  Today she is feeling better.  She still has a cough with clear sputum.  Has a constant "tickle" in her throat.  No joint, myalgias or headaches.  She does have sweats, but with hot flashes.  She has not had a fever that she knows of.  She has noticed that she gets very dizzy when she gets up from sitting.  She has had a very runny nose.       Current Allergies: No known allergies     Risk Factors: Tobacco use:  never Drug use:  no Alcohol use:  no Exercise:  no Seatbelt use:  100 %  Colonoscopy History:    Date of Last Colonoscopy:  12/22/2002  Mammogram History:    Date of Last Mammogram:  02/10/2006   Review of Systems       The patient complains of dyspnea on exertion and prolonged cough.  The patient denies fever, weight loss, chest pain, and enlarged lymph nodes.     Physical  Exam  General:     alert and overweight-appearing.   Head:     normocephalic and atraumatic.   Eyes:     pupils equal, pupils round, and pupils reactive to light.   Ears:     TM's white and L TM is slightly erythmatous but clear and not distended.   Nose:     mucosa is pale with thin clear discharge. Mouth:     good dentition, pharynx pink and moist, no erythema, and no exudates.   Breasts:     skin/areolae normal and no masses.  Both breasts are very tender over the inferior border on the pectoralis muscle.  No tenderness of the nipple or glandular breast tissue. right breast more tender than left. Lungs:     normal respiratory effort, normal breath sounds, and no fremitus.   Heart:     normal rate, regular rhythm, and no murmur.   Abdomen:     soft and normal bowel sounds.   Pulses:     +1 Extremities:     no edema Neurologic:     alert & oriented X3, cranial nerves II-XII intact, and strength normal in  all extremities.   Skin:     turgor normal and color normal.   Cervical Nodes:     no anterior cervical adenopathy and no posterior cervical adenopathy.   Psych:     Oriented X3, memory intact for recent and remote, and normally interactive.      Impression & Recommendations:  Problem # 1:  URI (ICD-465.9) RLL PNA seen on CXR this weekend.  1g zithromax once is not adiquate treatment.  Will give 500mg  by mouth daily for 7 more days.  If symptoms persist she should come back.  She had no white count at the ED and has had no fevers.  Her BP is stable today, I do not think the dizzyness represents sepsis.  May be related to inner ear congestion or decreased fluid intake.  Problem # 2:  BREAST PAIN, BILATERAL (ICD-611.71) Has bilateral shooting breast pains for 5-6 years.  Pain seems to be located in the pectoral muscle. Breasts are very tender, almost prohibiting the exam.  Will check CK and CMET.  Will refer to breast center.   Orders: Misc. Referral (Misc. Ref) T-CK  Total [phone])   Problem # 3:  HYPERLIPIDEMIA (ICD-272.4) Pravoachol increased in 4/08.  Will check CMET.  Her updated medication list for this problem includes:    Pravastatin Sodium 80 Mg Tabs (Pravastatin sodium) .Marland Kitchen... Take 1 tab by mouth at bedtime  Orders: T-Comprehensive Metabolic Panel ([id number])   Complete Medication List: 1)  Pravastatin Sodium 80 Mg Tabs (Pravastatin sodium) .... Take 1 tab by mouth at bedtime 2)  Norvasc 10 Mg Tabs (Amlodipine besylate) .... Take 1 tablet by mouth once a day 3)  Lisinopril 40 Mg Tabs (Lisinopril) .... Take 1 tablet by mouth once a day 4)  Tramadol Hcl 50 Mg Tabs (Tramadol hcl) .... One pill every 6 hours as needed for pain 5)  Hydrochlorothiazide 25 Mg Tabs (Hydrochlorothiazide) .... Take 1 tablet by mouth once a day 6)  Albuterol 90 Mcg/act Aers (Albuterol) .... Take 2 puffs every 4 hours as needed 7)  Ayr Saline Nasal Neti Rinse 1.57 Gm Pack (Sodium chloride-sodium bicarb) .... Use 1-2 times daily as directed 8)  Zithromax 500 Mg Tabs (Azithromycin) .... Take one tablet daily for 7 days.   Patient Instructions: 1)  You have a new prescription for zithromax for one week. 2)  You had lab work done today, if anything is abnomal we will let you know. 3)  You will have an appointment at the breast center for your breast pain.   Prescriptions: ZITHROMAX 500 MG  TABS (AZITHROMYCIN) Take one tablet daily for 7 days.  #7 x 0   Entered and Authorized by:   Myrtis Ser MD   Signed by:   Myrtis Ser MD on 11/07/2007   Method used:   Electronically sent to ...       [facility] [phone]*       [address]       [place], [place]  [address]       Ph: [phone] or [phone]       Fax: [phone]   RxID:   [id number]  ]

## 2010-04-22 NOTE — Assessment & Plan Note (Signed)
Summary: REASSIGNED NEW TO DR/CFB   Vital Signs:  Patient profile:   60 year old female Height:      66.5 inches (168.91 cm) Weight:      229.04 pounds (104.11 kg) BMI:     36.55 Temp:     97.9 degrees F (36.61 degrees C) oral Pulse rate:   96 / minute BP sitting:   139 / 84  (right arm) Cuff size:   large  Vitals Entered By: [name] Nephew RN (October 15, 2009 2:20 PM) CC: Depression Is Patient Diabetic? No Pain Assessment Patient in pain? yes     Location: back Intensity: 7 Type: aching Onset of pain  Constant Nutritional Status BMI of 25 - 29 = overweight  Have you ever been in a relationship where you felt threatened, hurt or afraid?No   Does patient need assistance? Functional Status Self care Comments Abdominal cramps at times.  No flatus with.  Hurts light hunger pains.  Regular BM's.  Needs refill on Tradol and HCTZ.   Primary Care Provider:  Lester Benton MD  CC:  Depression.  History of Present Illness: 60 y/o woman with asthma, HTN, HLD (not on meds) presents with complaint of abd pain. Pain has been going on for a long time. started gradually, feels like a deep gnawing pain, more in epigastrium, more in morning, worse with lying down position, releived with food and nexium (used in past). no CP, SOB, burning urination, constipation, tenderness on abd exam  no other complaint  Depression History:      The patient is having a depressed mood most of the day and has a diminished interest in her usual daily activities.         Preventive Screening-Counseling & Management  Alcohol-Tobacco     Alcohol drinks/day: 0     Smoking Status: never     Passive Smoke Exposure: no  Current Medications (verified): 1)  Norvasc 10 Mg Tabs (Amlodipine Besylate) .... Take 1 Tablet By Mouth Once A Day 2)  Lisinopril 40 Mg  Tabs (Lisinopril) .... Take 1 Tablet By Mouth Once A Day 3)  Hydrochlorothiazide 25 Mg  Tabs (Hydrochlorothiazide) .... Take 1 Tablet By Mouth Once A  Day 4)  Albuterol 90 Mcg/act  Aers (Albuterol) .... Take 2 Puffs Every 4 Hours As Needed 5)  Advair Diskus 100-50 Mcg/dose Misc (Fluticasone-Salmeterol) .... Take 1 Puff Twice Daily 6)  Ativan 1 Mg Tabs (Lorazepam) .... Take One Tablet As Needed For Panic Attack. 7)  Pantoprazole Sodium 40 Mg Tbec (Pantoprazole Sodium) .... Take 1 Tablet By Mouth Once A Day  Allergies (verified): 1)  ! Crestor 2)  Lipitor  Review of Systems       The patient complains of abdominal pain.  The patient denies anorexia, fever, weight loss, weight gain, vision loss, decreased hearing, hoarseness, chest pain, syncope, dyspnea on exertion, peripheral edema, prolonged cough, headaches, hemoptysis, melena, hematochezia, severe indigestion/heartburn, hematuria, incontinence, genital sores, muscle weakness, suspicious skin lesions, transient blindness, difficulty walking, depression, unusual weight change, abnormal bleeding, enlarged lymph nodes, angioedema, breast masses, and testicular masses.    Physical Exam  General:  alert, well-developed, well-nourished, and well-hydrated.   Head:  normocephalic and atraumatic.   Eyes:  vision grossly intact, pupils equal, pupils round, and pupils reactive to light.   Ears:  R ear normal and L ear normal.   Nose:  no external deformity.   Neck:  supple and full ROM.   Chest Wall:  no deformities.  Lungs:  normal respiratory effort, no accessory muscle use, normal breath sounds, no dullness, no fremitus, no crackles, and no wheezes.   Heart:  normal rate, regular rhythm, and no murmur.   Abdomen:  soft, non-tender, normal bowel sounds, no distention, no masses, no guarding, no rigidity, no rebound tenderness, no abdominal hernia, no hepatomegaly, and no splenomegaly.   Msk:  normal ROM and no joint tenderness.   Pulses:  dorsalis pedis pulses normal bilaterally  Extremities:  no edema Neurologic:  OrientedX3, cranial nerver 2-12 intact,strength good in all extremities,  sensations normal to light touch, reflexes 2+ b/l, gait normal    Impression & Recommendations:  Problem # 1:  SLEEP DISORDER (ICD-780.50) She has not been to a sleep center yet, she went to one 2 yrs ago at which time she was told that she has a sinus problems. She was referrad again at last visit because she had been having excessive sleepiness. She says she does get up sometime at night feeling a wheeze when she breaths and also has snoring at night. She has some fincancial problems to take care of befoer she can get the test. I asked her to call the insurance and see how much will the co-pay be if she wants to get the test as it is important to get the test done soon to see if she needs a CPAP. She has not been falling off while driving or other important activity requiring urgent action today  Problem # 2:  HYPERLIPIDEMIA (ICD-272.4) AS per Dr. Volanda Leach previous notes, she is not on meds because of abd discomfort. Will recheck FLP at next visit for risk stratification. No change today  Problem # 3:  HYPERTENSION (ICD-401.9) Controlled, Conitnue current meds. Encourage getting the sleep study done .   Her updated medication list for this problem includes:    Norvasc 10 Mg Tabs (Amlodipine besylate) .Marland Kitchen... Take 1 tablet by mouth once a day    Lisinopril 40 Mg Tabs (Lisinopril) .Marland Kitchen... Take 1 tablet by mouth once a day    Hydrochlorothiazide 25 Mg Tabs (Hydrochlorothiazide) .Marland Kitchen... Take 1 tablet by mouth once a day  BP today: 139/84 Prior BP: 129/85 (04/26/2009)  Prior 10 Yr Risk Heart Disease: 24 % (04/20/2006)  Labs Reviewed: K+: 3.9 (04/26/2009) Creat: : 1.17 (04/26/2009)   Chol: 258 (04/26/2009)   HDL: 42 (04/26/2009)   LDL: 177 (04/26/2009)   TG: 193 (04/26/2009)  Problem # 4:  GERD (ICD-530.81) Her current abdominal discomfort sounds very muck like GERD. Will try protonix first and if its not heloing will use nexium which she used in past and had helped her.   Her updated medication  list for this problem includes:    Pantoprazole Sodium 40 Mg Tbec (Pantoprazole sodium) .Marland Kitchen... Take 1 tablet by mouth once a day  Complete Medication List: 1)  Norvasc 10 Mg Tabs (Amlodipine besylate) .... Take 1 tablet by mouth once a day 2)  Lisinopril 40 Mg Tabs (Lisinopril) .... Take 1 tablet by mouth once a day 3)  Hydrochlorothiazide 25 Mg Tabs (Hydrochlorothiazide) .... Take 1 tablet by mouth once a day 4)  Albuterol 90 Mcg/act Aers (Albuterol) .... Take 2 puffs every 4 hours as needed 5)  Advair Diskus 100-50 Mcg/dose Misc (Fluticasone-salmeterol) .... Take 1 puff twice daily 6)  Ativan 1 Mg Tabs (Lorazepam) .... Take one tablet as needed for panic attack. 7)  Pantoprazole Sodium 40 Mg Tbec (Pantoprazole sodium) .... Take 1 tablet by mouth once a day  Patient Instructions: 1)  Please schedule a follow-up appointment in 6 months. 2)  It is important that you exercise regularly at least 20 minutes 5 times a week. If you develop chest pain, have severe difficulty breathing, or feel very tired , stop exercising immediately and seek medical attention. 3)  Consider a lower calorie diet and regular exercise.  4)  Take your advair every day, 1 in the morning and 1 in the evening.  5)  Call your insurance company and ask if they would cover the sleep study. It is important that you get that test done Prescriptions: PANTOPRAZOLE SODIUM 40 MG TBEC (PANTOPRAZOLE SODIUM) Take 1 tablet by mouth once a day  #31 x 6   Entered and Authorized by:   [name] Gaston MD   Signed by:   Lester  MD on 10/15/2009   Method used:   Electronically to        [facility] [phone]* (retail)       [place], [place]  [id number]       Ph: [phone] or [phone]       Fax: [phone]   RxID:   [id number]   Prevention & Chronic Care Immunizations   Influenza vaccine: Historical  (02/20/2009)   Influenza vaccine due: 11/22/2010    Tetanus  booster: 02/20/2002: Historical   Tetanus booster due: 02/21/2012    Pneumococcal vaccine: Historical  (02/21/2004)   Pneumococcal vaccine due: 02/20/2009    H. zoster vaccine: Not documented   H. zoster vaccine deferral: Refused  (10/15/2009)  Colorectal Screening   Hemoccult: Negative  (03/03/2005)   Hemoccult action/deferral: Not indicated  (10/10/2008)    Colonoscopy: Normal  (12/22/2002)   Colonoscopy action/deferral: scheduled with G.I.  (05/04/2006)   Colonoscopy due: 12/21/2012  Other Screening   Pap smear: Not documented   Pap smear action/deferral: Not indicated S/P hysterectomy  (10/10/2008)    Mammogram: BI-RADS CATEGORY 2:  Benign finding(s).^MM DIGITAL DIAG LTD L  (04/12/2009)   Mammogram due: 03/12/2010    DXA bone density scan: Not documented   Smoking status: never  (10/15/2009)  Lipids   Total Cholesterol: 258  (04/26/2009)   Lipid panel action/deferral: Lipid Panel ordered   LDL: 177  (04/26/2009)   LDL Direct: Not documented   HDL: 42  (04/26/2009)   Triglycerides: 193  (04/26/2009)    SGOT (AST): 18  (04/26/2009)   BMP action: Ordered   SGPT (ALT): 17  (04/26/2009)   Alkaline phosphatase: 58  (04/26/2009)   Total bilirubin: 0.5  (04/26/2009)    Lipid flowsheet reviewed?: Yes   Progress toward LDL goal: Unchanged  Hypertension   Last Blood Pressure: 139 / 84  (10/15/2009)   Serum creatinine: 1.17  (04/26/2009)   BMP action: Ordered   Serum potassium 3.9  (04/26/2009)    Hypertension flowsheet reviewed?: Yes   Progress toward BP goal: Deteriorated  Self-Management Support :   Personal Goals (by the next clinic visit) :      Personal blood pressure goal: 140/90  (04/26/2009)     Personal LDL goal: 100  (04/26/2009)    Patient will work on the following items until the next clinic visit to reach self-care goals:     Medications and monitoring: take my medicines every day, bring all of my medications to every visit  (10/15/2009)      Eating: drink diet soda or water  instead of juice or soda, eat more vegetables, use fresh or frozen vegetables, eat foods that are low in salt, eat baked foods instead of fried foods, eat fruit for snacks and desserts, limit or avoid alcohol  (10/15/2009)     Activity: take a 30 minute walk every day  (10/15/2009)    Hypertension self-management support: Written self-care plan, Education handout, Pre-printed educational material, Resources for patients handout  (10/15/2009)   Hypertension self-care plan printed.   Hypertension education handout printed    Lipid self-management support: Written self-care plan, Education handout, Pre-printed educational material, Resources for patients handout  (10/15/2009)   Lipid self-care plan printed.   Lipid education handout printed      Resource handout printed.    Vital Signs:  Patient profile:   60 year old female Height:      66.5 inches (168.91 cm) Weight:      229.04 pounds (104.11 kg) BMI:     36.55 Temp:     97.9 degrees F (36.61 degrees C) oral Pulse rate:   96 / minute BP sitting:   139 / 84  (right arm) Cuff size:   large  Vitals Entered By: [name] Nephew RN (October 15, 2009 2:20 PM)
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
## 2010-04-22 NOTE — Letter (Signed)
Summary: IMMUNIZATION FLU  IMMUNIZATION FLU   Imported By: Garlan Fillers 01/02/2010 15:26:56  _____________________________________________________________________  External Attachment:    Type:   Image     Comment:   External Document

## 2010-04-22 NOTE — Assessment & Plan Note (Signed)
Summary: needs to change med/pcp-walsh/hla   Vital Signs:  Patient Profile:   60 Years Old Female Height:     68 inches (172.72 cm) Weight:      233.6 pounds (106.18 kg) BMI:     35.65 Temp:     97.2 degrees F (36.22 degrees C) oral Pulse rate:   102 / minute BP sitting:   149 / 83  (right arm) Cuff size:   regular  Pt. in pain?   no  Vitals Entered By: Lucky Rathke NT II (April 18, 2008 9:38 AM)              Is Patient Diabetic? No Nutritional Status BMI of > 30 = obese  Have you ever been in a relationship where you felt threatened, hurt or afraid?No   Does patient need assistance? Functional Status Self care Ambulation Normal     PCP:  Caroleen Hamman MD  Chief Complaint:  HERE TO TALK ABOUT MEDICATION-CAN'T TAKE LIPITOR.  History of Present Illness: 60yo female with pmh as described on the EMR. Who came to the [facility] in order to discuss medication options for her HLD. She reports that years ago she used lipitor and developed muscle ache and also abdominal pain. She had her lipid profile checked during last appointment and was 125 after been taking pravastatin 80mg  daily for the last 7-8 month.  Pt denies any complaints and reports that her asthma and BP are well controlled. No depressed mood or suicidal ideation.  She take her medications everyday.    Updated Prior Medication List: NORVASC 10 MG TABS (AMLODIPINE BESYLATE) Take 1 tablet by mouth once a day LISINOPRIL 40 MG  TABS (LISINOPRIL) Take 1 tablet by mouth once a day TRAMADOL HCL 50 MG TABS (TRAMADOL HCL) one pill every 6 hours as needed for pain HYDROCHLOROTHIAZIDE 25 MG  TABS (HYDROCHLOROTHIAZIDE) Take 1 tablet by mouth once a day ALBUTEROL 90 MCG/ACT  AERS (ALBUTEROL) Take 2 puffs every 4 hours as needed BENZONATATE 100 MG CAPS (BENZONATATE) Take one capsule by mouth once every 4 hours as needed for cough ADVAIR DISKUS 100-50 MCG/DOSE MISC (FLUTICASONE-SALMETEROL) Take 1 puff twice daily FLONASE 50  MCG/ACT SUSP (FLUTICASONE PROPIONATE) Take 2 sprays in each nostril once daily for 4 days, and then one spray in each nostril once daily for a week  Current Allergies (reviewed today): LIPITOR  Past Medical History:    Reviewed history from 05/04/2006 and no changes required:       Depression-seasonal melancholia       Hyperlipidemia       Hypertension, stage II       Back pain with degenerative end plates, lumbar spondylosis       Myopathy etiology unclear, not statin induced   Family History:    Reviewed history from 04/20/2006 and no changes required:       Family History of breast cancer 1st degree relative, Mom died at age 75       Family History of CAD Female 1st degree relative <60       Family History of Colon CA 1st degree relative, Father died at age 51   Risk Factors:  Tobacco use:  never Passive smoke exposure:  no Drug use:  no HIV high-risk behavior:  no Alcohol use:  no Exercise:  no Seatbelt use:  100 %  Colonoscopy History:    Date of Last Colonoscopy:  12/22/2002  Mammogram History:    Date of Last Mammogram:  03/12/2008  Review of Systems       As per HPi.   Physical Exam  General:     alert and overweight-appearing.   Lungs:     normal respiratory effort, normal breath sounds, and no wheezes.   Heart:     normal rate, regular rhythm, and no murmur.   Abdomen:     soft and normal bowel sounds.   Neurologic:     alert & oriented X3 and cranial nerves II-XII intact.      Impression & Recommendations:  Problem # 1:  HYPERLIPIDEMIA (B2193296.4) Pt LDL was 125 after been taking pravachol for 7-8 months. she was recently changed to lipitor, but she is unable to take because of idiosyncrasy side effect to the medications. She has used crestor in the past and feels confortable taking it; will change lipitor to crestor 40mg  daily; will check CMET today; will recommend low fat diet and exercise. Will repeat lipid profile in 3 months.  The  following medications were removed from the medication list:    Lipitor 40 Mg Tabs (Atorvastatin calcium) .Marland Kitchen... Take one tablet daily to lower cholesterol.  Her updated medication list for this problem includes:    Crestor 40 Mg Tabs (Rosuvastatin calcium) .Marland Kitchen... Take 1 tablet by mouth once a day  Orders: T-Comprehensive Metabolic Panel ([id number])  Labs Reviewed: Chol: 210 (03/21/2008)   HDL: 47 (03/21/2008)   LDL: 125 (03/21/2008)   TG: 190 (03/21/2008) SGOT: 21 (12/09/2007)   SGPT: 20 (12/09/2007)  Prior 10 Yr Risk Heart Disease: 24 % (04/20/2006)   Problem # 2:  HYPERTENSION (ICD-401.9) Assessment: New Pt compliant with her medications and BP almost a goal. Will Continue same medication regimen for now; will encourage pt to do exercise and to lose weight and also to follow a low sodium diet, in order to achieve a better control.   BP today: 149/83 Prior BP: 142/81 (02/22/2008)  Prior 10 Yr Risk Heart Disease: 24 % (04/20/2006)  Labs Reviewed: Creat: 1.30 (12/09/2007) Chol: 210 (03/21/2008)   HDL: 47 (03/21/2008)   LDL: 125 (03/21/2008)   TG: 190 (03/21/2008)  Her updated medication list for this problem includes:    Norvasc 10 Mg Tabs (Amlodipine besylate) .Marland Kitchen... Take 1 tablet by mouth once a day    Lisinopril 40 Mg Tabs (Lisinopril) .Marland Kitchen... Take 1 tablet by mouth once a day    Hydrochlorothiazide 25 Mg Tabs (Hydrochlorothiazide) .Marland Kitchen... Take 1 tablet by mouth once a day   Complete Medication List: 1)  Norvasc 10 Mg Tabs (Amlodipine besylate) .... Take 1 tablet by mouth once a day 2)  Lisinopril 40 Mg Tabs (Lisinopril) .... Take 1 tablet by mouth once a day 3)  Tramadol Hcl 50 Mg Tabs (Tramadol hcl) .... One pill every 6 hours as needed for pain 4)  Hydrochlorothiazide 25 Mg Tabs (Hydrochlorothiazide) .... Take 1 tablet by mouth once a day 5)  Albuterol 90 Mcg/act Aers (Albuterol) .... Take 2 puffs every 4 hours as needed 6)  Benzonatate 100 Mg Caps (Benzonatate) .... Take  one capsule by mouth once every 4 hours as needed for cough 7)  Advair Diskus 100-50 Mcg/dose Misc (Fluticasone-salmeterol) .... Take 1 puff twice daily 8)  Flonase 50 Mcg/act Susp (Fluticasone propionate) .... Take 2 sprays in each nostril once daily for 4 days, and then one spray in each nostril once daily for a week 9)  Crestor 40 Mg Tabs (Rosuvastatin calcium) .... Take 1 tablet by mouth once a day   Patient Instructions:  1)  Please schedule a follow-up appointment in 3 months. 2)  Take your medications as prescribed. 3)  Limit your Sodium (remember that is not just Salt). 4)  It is important that you exercise regularly at least 20 minutes 5 times a week.  5)  You need to lose weight. Consider a lower calorie diet and regular exercise.    Prescriptions: CRESTOR 40 MG TABS (ROSUVASTATIN CALCIUM) Take 1 tablet by mouth once a day  #31 x 5   Entered and Authorized by:   Barton Dubois MD   Signed by:   Barton Dubois MD on 04/18/2008   Method used:   Electronically to        [facility] [phone]* (retail)       [address]       [place], [place]  [address]       Ph: [phone] or [phone]       Fax: [phone]   RxID:   [id number]

## 2010-04-22 NOTE — Progress Notes (Signed)
Summary: refill/gg  Phone Note Refill Request  on January 13, 2007 2:07 PM  Refills Requested: Medication #1:  NORVASC 10 MG TABS Take 1 tablet by mouth once a day   Last Refilled: 11/13/2006  Method Requested: electronic Initial call taken by: Gevena Cotton RN,  January 13, 2007 2:07 PM  Follow-up for Phone Call        Refill approved-nurse to complete Follow-up by: Lucy Chris MD,  January 13, 2007 2:12 PM      Prescriptions: NORVASC 10 MG TABS (AMLODIPINE BESYLATE) Take 1 tablet by mouth once a day  #31 x 2   Entered and Authorized by:   Lucy Chris MD   Signed by:   Lucy Chris MD on 01/13/2007   Method used:   Electronically sent to ...       [facility] [phone]*       [address]       [place], [place]  [address]       Ph: [phone] or [phone]       Fax: [phone]   RxID:   [id number]

## 2010-04-22 NOTE — Assessment & Plan Note (Signed)
Summary: BACK PAIN/GG   Vital Signs:  Patient Profile:   60 Years Old Female Weight:      237.0 pounds Temp:     97.9 degrees F oral Pulse rate:   72 / minute BP sitting:   203 / 114  (right arm)  Pt. in pain?   yes    Location:   back    Intensity:   9  Vitals Entered By: Silverio Decamp (April 20, 2006 3:34 PM)              Is Patient Diabetic? No Nutritional Status Normal  Have you ever been in a relationship where you felt threatened, hurt or afraid?No   Does patient need assistance? Functional Status Self care Ambulation Normal        Chief Complaint:  Back Pain.  History of Present Illness:  This is a 60 years old female who presents with Back pain, longstanding since 2005.  She complains of weakness and loss of sensation,especially to her lateral left le. She denies fever.  The pain is located along her lateral lower lumbar region especially around the midscapular legion with . It is not located along her spinous processess of lumbar spine.  She denies any recent trauma and states the pain is gradual, but today is the worst it has ever been.  The pain radiates to left leg/foot, around her 2nd toe,which is essentially numb.  Symptoms are made worse with flexion and inactivity.  The pain is improved with activity and sitting or bending forward.  She states the pain is especially worse with lying flat in bed.  The pain is not improved with OTC analgesics such as Motrin.  She is no longer taking Diclofenac. She has never taken any muscle relaxants or narcotics for this condition.  She was recently taken off Micardis and eventually Benazepril for increased Cr1.5, after Cr increased from 1.1 from increasing her Benazepril dose in Dec 2007.  She is also due for her colonscopy,given her family hx/o colon cancer with her father at age 39.   A review of her entire medical record from [place] reveals she has this left posterior iliac crest pain and severe depression. She  has been tried on Lyrica and Cymbalta with little response.  There als was a diagnosis of DM Type II in her chart, but no therapy for this.  She is also hoping to get recertified for disability, she has a form to sign today.    Back Pain History:      The patient's back pain has been present for > 6 weeks.  The pain is located in the lower back region and does radiate below the knees.  She states that she has had a prior history of back pain.  The patient has not had any recent physical therapy for her back pain.    Critical Exclusionary Diagnosis Criteria (CEDC) for Back Pain:      There are no symptoms to suggest infection.  Cancer risk factors include no improvement in low back pain after 4-6 weeks therapy.  Symptoms to suggest the possibility of cauda equina syndrome include sensory deficit of lower extremity.  Possible psychosocial cause(s) for her back pain include failure of previous therapy.  Other positive CEDC factors include low back pain worse with lumbar extension (downhill walking-standing-reaching overhead) and progressive neurologic symptoms.    Hypertension History:      She complains of dyspnea with exertion and peripheral edema, but denies headache, chest pain,  and visual symptoms.  She notes no problems with any antihypertensive medication side effects.        Positive major cardiovascular risk factors include female age 38 years old or older, hyperlipidemia, and hypertension.  Negative major cardiovascular risk factors include no history of diabetes and non-tobacco-user status.       Prior Medications (reviewed today): PRAVACHOL 10 MG TABS (PRAVASTATIN SODIUM)  Current Allergies (reviewed today): No known allergies   Past Medical History:    Depression-seasonal melancholia    Hyperlipidemia    Hypertension    Back pain with degenerative end plates, lumbar spondylosis    Myopathy etiology unclear, not statin induced  Past Surgical History:    Hysterectomy     Cervical laminectomy    Oophorectomy   Family History:    Family History of breast cancer 1st degree relative, Mom died at age 67    Family History of CAD Female 1st degree relative <60    Family History of Colon CA 1st degree relative, Father died at age 48  Social History:    Single, 2 kids    Alcohol use-no    Drug use-no    Laid off from [employer] after 47yrs    Risk Factors:  Tobacco use:  never Drug use:  no Alcohol use:  no   Review of Systems  General      Denies fever and weight loss.  CV      Complains of shortness of breath with exertion, swelling of feet, and swelling of hands.      Denies chest pain or discomfort.  Resp      Complains of shortness of breath and wheezing.  GI      Complains of diarrhea and nausea.  GU      Complains of hematuria, incontinence, urinary frequency, and urinary hesitancy.      Denies dysuria.  MS      See HPI      Her right knee, acting up, feeling like arthritis  Neuro      See HPI   Physical Exam  General:     alert, well-developed, well-nourished, overweight-appearing, uncomfortable-appearing, and moderate distress.   Head:     normocephalic.   Eyes:     vision grossly intact.   Neck:     supple, full ROM, no masses, and no thyromegaly.   Lungs:     normal respiratory effort, normal breath sounds, and no wheezes.   Heart:     normal rate, regular rhythm, no murmur, no gallop, and no JVD.  normal rate, regular rhythm, no murmur, no gallop, and no JVD.   Abdomen:     soft and non-tender.  soft and non-tender.   Extremities:     1+ left pedal edema and 2+ left pedal edema.  1+ left pedal edema and 2+ left pedal edema.   Neurologic:     alert & oriented X3 and cranial nerves II-XII intact.  alert & oriented X3 and cranial nerves II-XII intact.   Skin:     turgor normal and no rashes.  turgor normal and no rashes.   Psych:     Oriented X3, normally interactive, and good eye contact.  Oriented  X3, normally interactive, and good eye contact.    Low Back Pain Physical Exam:    Inspection-deformity:     No    Palpation-spinal tenderness:   No    Motor Exam/Strength:         Left  Ankle Dorsiflexion (L5,L4):     abnormal       Left Great Toe Dorsiflexion (L5,L4):     abnormal       Left Heel Walk (L5,some L4):     abnormal       Left Single Squat & Rise-Quads (L4):   abnormal       Left Toe Walk-calf (S1):       abnormal       Right Ankle Dorsiflexion (L5,L4):     abnormal       Right Great Toe Dorsiflexion (L5,L4):       abnormal       Right Heel Walk (L5,some L4):     abnormal       Right Single Squat & Rise Quads (L4):   abnormal       Right Toe Walk-calf (S1):       abnormal    Sensory Exam/Pinprick:        Left Medial Foot (L4):   normal       Left Dorsal Foot (L5):   normal       Left Lateral Foot (S1):   decreased       Right Medial Foot (L4):   normal       Right Dorsal Foot (L5):   normal       Right Lateral Foot (S1):   normal    Reflexes:        Left Knee Jerk (L4):     decreased       Left Ankle Reflex (S1):   decreased       Right Knee Jerk:     decreased       Right Ankle Reflex (S1):   decreased    Straight Leg Raise (SLR):       Left Straight Leg Raise (SLR):   positive at 10 degrees       Right Straight Leg Raise (SLR):   positive at 10 degrees   Detailed Back/Spine Exam  Lumbosacral Exam:  Contralateral Straight Leg Raise:    Right:  positive at 10 degrees    Impression & Recommendations:  Problem # 1:  BACK PAIN (ICD-724.5) She was advised of need for MRI Lumbar and Sacral spine in light of positive findings on exam today.  30mg  Toradol injection for now. She will hold all NSAIDS at present and f/u in 2wks. Plan to refer back to PT for now. Will re-eval disability paperwork when returns for appt.  The following medications were removed from the medication list:    Diclofenac Sodium 75 Mg Tbec (Diclofenac sodium) .Marland Kitchen... Take one tablet by mouth  two times a day as needed for pain.  Her updated medication list for this problem includes:    Flexeril 10 Mg Tab (Cyclobenzaprine hcl) .Marland Kitchen... Take one tablet by mouth three times a day    Lortab 5 5-500 Mg Tabs (Hydrocodone-acetaminophen) .Marland Kitchen... Take 1 tablet by mouth three times a day as needed for pain  Discussed use of moist heat or ice, modified activities, medications, and stretching/strengthening exercises. Back care instructions given. To be seen in 2 weeks if no improvement; sooner if worsening of symptoms.   Orders: MRI (MRI) Ketorolac-Toradol 15mg  [id number]) Physical Therapy Referral (PT) Admin of Therapeutic Inj  intramuscular or subcutaneous [id number])   Problem # 2:  HYPERTENSION (ICD-401.9) Profoundly out of control. At this visit was only on Atenolol 50mg  two times a day. As she does not have any history of CAD or  CHF, will change over to Norvasc 10mg  daily and add back HCTZ 25 mg daily. Repeat BMET in 4 wks, with f/u for HTN in 2wks to evaluate trend.   The following medications were removed from the medication list:    Lotensin 40 Mg Tabs (Benazepril hcl) ..... One tablet daily    Micardis Hct 80-12.5 Mg Tabs (Telmisartan-hctz)  Her updated medication list for this problem includes:    Norvasc 10 Mg Tabs (Amlodipine besylate) .Marland Kitchen... Take 1 tablet by mouth once a day    Hydrochlorothiazide 25 Mg Tab (Hydrochlorothiazide) .Marland Kitchen... Take 1 tablet by mouth once a day  Orders: T-Comprehensive Metabolic Panel ([id number]) T-CBC No Diff [id number]) T-T4, Free [id number]) T-TSH [id number])   Problem # 3:  LUMBOSACRAL RADICULOPATHY (ICD-724.4) Exam concerning today. See problem #1  Her updated medication list for this problem includes:    Flexeril 10 Mg Tab (Cyclobenzaprine hcl) .Marland Kitchen... Take one tablet by mouth three times a day    Lortab 5 5-500 Mg Tabs (Hydrocodone-acetaminophen) .Marland Kitchen... Take 1 tablet by mouth three times a day as needed for pain  The following medications  were removed from the medication list:    Diclofenac Sodium 75 Mg Tbec (Diclofenac sodium) .Marland Kitchen... Take one tablet by mouth two times a day as needed for pain.  Her updated medication list for this problem includes:    Flexeril 10 Mg Tab (Cyclobenzaprine hcl) .Marland Kitchen... Take one tablet by mouth three times a day    Lortab 5 5-500 Mg Tabs (Hydrocodone-acetaminophen) .Marland Kitchen... Take 1 tablet by mouth three times a day as needed for pain   Problem # 4:  HYPERLIPIDEMIA (ICD-272.4) Will check CMP today and recheck lipid panel at next visit if able.  Her updated medication list for this problem includes:    Pravachol 10 Mg Tabs (Pravastatin sodium)  Labs Reviewed: Chol: 288 (02/01/2006)   HDL: 43 (02/01/2006)   LDL: 189 (02/01/2006)   TG: 281 (02/01/2006) SGOT: 16 (02/01/2006)   SGPT: 14 (02/01/2006)  10 Yr Risk Heart Disease: 24 %   Problem # 5:  INCONTINENCE, URGE ([id number]) She is s/p Hysterectomy and has noted increased use of female pads. Long standing problem. Will re-eval and next visit and consider adding pharmacotherapy. Assess Kegel exercises at next visit.  Problem # 6:  SCREENING FOR MALIGNANT NEOPLASM, COLON (ICD-V76.51)  Orders: Gastroenterology Referral (GI)   Medications Added to Medication List This Visit: 1)  Flexeril 10 Mg Tab (Cyclobenzaprine hcl) .... Take one tablet by mouth three times a day 2)  Lortab 5 5-500 Mg Tabs (Hydrocodone-acetaminophen) .... Take 1 tablet by mouth three times a day as needed for pain 3)  Norvasc 10 Mg Tabs (Amlodipine besylate) .... Take 1 tablet by mouth once a day 4)  Hydrochlorothiazide 25 Mg Tab (Hydrochlorothiazide) .... Take 1 tablet by mouth once a day  Hypertension Assessment/Plan:      The patient's hypertensive risk group is category B: At least one risk factor (excluding diabetes) with no target organ damage.  Her calculated 10 year risk of coronary heart disease is 24 %.  Today's blood pressure is 203/114.     Medications Added to  Medication List This Visit: 1)  Flexeril 10 Mg Tab (Cyclobenzaprine hcl) .... Take one tablet by mouth three times a day 2)  Lortab 5 5-500 Mg Tabs (Hydrocodone-acetaminophen) .... Take 1 tablet by mouth three times a day as needed for pain 3)  Norvasc 10 Mg Tabs (Amlodipine besylate) .... Take 1 tablet by mouth once  a day 4)  Hydrochlorothiazide 25 Mg Tab (Hydrochlorothiazide) .... Take 1 tablet by mouth once a day  Hypertension Assessment/Plan:      The patient's hypertensive risk group is category B: At least one risk factor (excluding diabetes) with no target organ damage.  Her calculated 10 year risk of coronary heart disease is 24 %.  Today's blood pressure is 203/114.     Patient Instructions: 1)  Please schedule a follow-up appointment in 2 weeks. 2)  Most patients (90%) of patients with low back pain will improve with time (2-6 weeks). Limit activity to comfort and avoid activities that increase discomfort.  Apply moist heat and/or ice to lower back and take medication as instructed for pain relief. 3)  Discussed the risks-benefits and indications for preventive Aspirin therapy. Recommend that the patient start (or continue) taking 81 mg of Aspirin a day.   Medication Administration  Injection # 1:    Medication: ketorolac-Toradol 30mg     Diagnosis: BACK PAIN (ICD-724.5)    Route: IM    Site: LUOQ gluteus    Exp Date: 10/22/2007    Lot #: [id number]    Mfr: [email or web address]    Patient tolerated injection without complications    Given by: Mateo Flow ([facility])  Orders Added: 1)  MRI [MRI] 2)  Ketorolac-Toradol 15mg  [[id number]] 3)  T-Comprehensive Metabolic Panel [id number] 4)  T-CBC No Diff [[id number]] 5)  T-T4, Free [id number] 6)  T-TSH [id number] 7)  Physical Therapy Referral [PT] 8)  Admin of Therapeutic Inj  intramuscular or subcutaneous [[id number]] 9)  Gastroenterology Referral [GI] 10)  Est. Patient Level IV [id number]

## 2010-04-22 NOTE — Progress Notes (Signed)
Summary: Results  Phone Note Call from Patient   Caller: Patient Call For: Myrtis Ser MD Summary of Call: Pt came in toay for Flu Vaccine.  Wouldlike to get results of her PFT's.  Says that symptoms are worsening at times.  Message to Dr. Volanda Napoleon. Pt to try and schedule an appointment for results as soon as possible. Sander Nephew RN  December 30, 2007 10:28 AM  Initial call taken by: Sander Nephew RN,  December 30, 2007 10:28 AM  Follow-up for Phone Call        I don't see any PFT results.  Could someone call about them?

## 2010-04-22 NOTE — Assessment & Plan Note (Signed)
Summary: est-lump in breast/ch   Vital Signs:  Patient profile:   60 year old female Height:      66.5 inches Weight:      231.9 pounds BMI:     37.00 Temp:     98.6 degrees F oral Pulse rate:   89 / minute BP sitting:   135 / 86  (right arm)  Vitals Entered By: Silverio Decamp NT II (January 08, 2010 10:58 AM) CC: NEED REFILLS ON  TRAMADOL, LORAZAPEM/ LUMP IN BREAST Is Patient Diabetic? No Pain Assessment Patient in pain? yes     Location: back Intensity: 7 Type: aching Nutritional Status BMI of > 30 = obese  Have you ever been in a relationship where you felt threatened, hurt or afraid?No   Does patient need assistance? Functional Status Self care Ambulation Normal   Primary Care Provider:  Lester Shafter MD  CC:  NEED REFILLS ON  TRAMADOL and LORAZAPEM/ LUMP IN BREAST.  History of Present Illness: 60 y/o w comes in with breast lump - on left breast - noticed 2 weeks ago - felt like it is shrinking since onset - no pain, but a little soreness - no skin changes - feels like a size of a golfball - no breast shape changes, no nipple discharge, no abnormality on the other side or anywhere else - had hystercetomy many months ago - never had lumps before - had a normal mammogram in 1/11 - her mother had breast cancer  Preventive Screening-Counseling & Management  Alcohol-Tobacco     Alcohol drinks/day: 0     Smoking Status: never     Passive Smoke Exposure: no  Caffeine-Diet-Exercise     Does Patient Exercise: no  Allergies: 1)  ! Crestor 2)  Lipitor  Physical Exam  General:  General: Alert, well developed and in no acurte distress ENT: mucous membranes pink & moist. No abnormal finds in ear and nose. CVC:S1 S2 , no murmurs, no abnormal heart sounds. Lungs: Clear to auscultation B/L. No wheezes, crackles or other abnormal sounds Abdomen: soft, non distended, no tender. Normal Bowel sounds EXT: no pitting edema, no engorged veins, Pulsations normal    Neuro:alert, oriented *3, cranial nerved 2-12 intact, strenght normal in all  extremities, senstations normal to light touch.   Breasts:  inspection- no shape discrepancy between 2 breasts, no nipple retraction, skin changes  palpation-  right-no lumps, nipple discharge, tenderness, LN enlargement along axillary line left- marble shaped, hard, probably fixed, smooth margin lump felt at 5 o clock position - no overlying skin changes - no pain - no nipple discharge - no LN palpable    Impression & Recommendations:  Problem # 1:  LUMP OR MASS IN BREAST (ICD-611.72) concerning lump, family history of first degree relative with breast cancer  plan - mammogram -follow up in a month  Orders: Mammogram (Mammogram)  Complete Medication List: 1)  Norvasc 10 Mg Tabs (Amlodipine besylate) .... Take 1 tablet by mouth once a day 2)  Lisinopril 40 Mg Tabs (Lisinopril) .... Take 1 tablet by mouth once a day 3)  Hydrochlorothiazide 25 Mg Tabs (Hydrochlorothiazide) .... Take 1 tablet by mouth once a day 4)  Albuterol 90 Mcg/act Aers (Albuterol) .... Take 2 puffs every 4 hours as needed 5)  Advair Diskus 100-50 Mcg/dose Misc (Fluticasone-salmeterol) .... Take 1 puff twice daily 6)  Ativan 1 Mg Tabs (Lorazepam) .... Take one tablet as needed for panic attack. 7)  Pantoprazole Sodium 40 Mg Tbec (Pantoprazole sodium) .Marland KitchenMarland KitchenMarland Kitchen  Take 1 tablet by mouth once a day 8)  Tramadol Hcl 50 Mg Tabs (Tramadol hcl) .Marland Kitchen.. 1 tablet every 6 hrs as needed for pain  Patient Instructions: 1)  Please schedule a follow-up appointment in 1 month. Prescriptions: ATIVAN 1 MG TABS (LORAZEPAM) Take one tablet as needed for panic attack.  #15 x 0   Entered and Authorized by:   Lester [place] MD   Signed by:   Lester [place] MD on 01/08/2010   Method used:   Print then Give to Patient   [id number] TRAMADOL HCL 50 MG TABS (TRAMADOL HCL) 1 tablet every 6 hrs as needed for pain  #30 x 0   Entered and Authorized by:    Lester Rincon MD   Signed by:   Lester  MD on 01/08/2010   Method used:   Print then Give to Patient   RxID:   [id number]    Orders Added: 1)  Mammogram [Mammogram] 2)  Est. Patient Level III [id number]

## 2010-04-22 NOTE — Assessment & Plan Note (Signed)
Summary: ([name] ) NEVER SEEN HER DR. / SB.   Vital Signs:  Patient Profile:   60 Years Old Female Height:     68 inches (172.72 cm) Weight:      232.1 pounds (105.50 kg) BMI:     35.42 Temp:     98.5 degrees F (36.94 degrees C) oral Pulse rate:   90 / minute BP sitting:   152 / 88  (right arm)  Pt. in pain?   yes    Location:   back    Intensity:   7-8  Vitals Entered By: Hilda Blades Ditzler RN (February 15, 2007 9:51 AM)              Is Patient Diabetic? No Nutritional Status BMI of > 30 = obese Nutritional Status Detail good  Have you ever been in a relationship where you felt threatened, hurt or afraid?denies   Does patient need assistance? Functional Status Self care Ambulation Normal     Chief Complaint:  Ck-up.  History of Present Illness: This is a 60 year old woman with a past medical history of Depression-seasonal melancholia Hyperlipidemia Hypertension, stage II Back pain with degenerative end plates, lumbar spondylosis Myopathy etiology unclear, not statin induced   who comes in for a routine check-up.  She has no complaints.  Current Allergies (reviewed today): No known allergies     Risk Factors: Tobacco use:  never Drug use:  no Alcohol use:  no Seatbelt use:  100 %  Colonoscopy History:    Date of Last Colonoscopy:  12/22/2002  Mammogram History:    Date of Last Mammogram:  02/10/2006   Review of Systems  General      Denies chills, fatigue, fever, and loss of appetite.  Eyes      Denies blurring, double vision, and vision loss-both eyes.  ENT      Denies decreased hearing, earache, nasal congestion, and sinus pressure.  CV      Complains of swelling of feet.      Denies chest pain or discomfort, fatigue, palpitations, and shortness of breath with exertion.  Resp      Complains of wheezing.      Denies cough, shortness of breath, and sputum productive.  GI      Complains of excessive appetite.      Denies abdominal pain,  bloody stools, change in bowel habits, constipation, dark tarry stools, diarrhea, indigestion, loss of appetite, nausea, and vomiting.  GU      Denies discharge, dysuria, incontinence, and urinary frequency.  MS      Complains of low back pain.  Neuro      Complains of tingling.      Denies brief paralysis and weakness.      Tingling in her left leg (just the bottom of the lefg, in lateral), and left hand, all fingers, since her surgery.   Physical Exam  General:     alert, well-developed, and well-nourished.   Head:     normocephalic and atraumatic.   Eyes:     vision grossly intact, pupils equal, pupils round, pupils reactive to light, pupils react to accomodation, corneas and lenses clear, and no injection.   Ears:     R ear normal and L ear normal.   Nose:     no external erythema and no nasal discharge.   Mouth:     good dentition, pharynx pink and moist, no erythema, no exudates, and no posterior lymphoid hypertrophy.   Neck:  supple and full ROM.   Lungs:     Normal respiratory effort, chest expands symmetrically. Lungs are clear to auscultation, no crackles or wheezes. Heart:     Normal rate and regular rhythm. S1 and S2 normal without gallop, murmur, click, rub or other extra sounds. Abdomen:     Bowel sounds positive,abdomen soft and non-tender without masses, organomegaly or hernias noted. Extremities:     trace left pedal edema and trace right pedal edema.   Neurologic:     alert & oriented X3, cranial nerves II-XII intact, and strength normal in all extremities.  Sensation to light touch altered along lateral aspect of lower left leg. Psych:     Oriented X3, memory intact for recent and remote, normally interactive, good eye contact, not anxious appearing, and not depressed appearing.      Impression & Recommendations:  Problem # 1:  HYPERTENSION (ICD-401.9) I have updated her medicaiton list and increased her lisinopril to 20 mg daily today.  She will  have a BMET today and in 1-2 weeks with a repeat blood pressure check. The following medications were removed from the medication list:    Lasix 40 Mg Tabs (Furosemide) ..... Once daily tab in am  Her updated medication list for this problem includes:    Norvasc 10 Mg Tabs (Amlodipine besylate) .Marland Kitchen... Take 1 tablet by mouth once a day    Lisinopril 20 Mg Tabs (Lisinopril) .Marland Kitchen... Take 1 tablet by mouth once a day    Hydrochlorothiazide 25 Mg Tabs (Hydrochlorothiazide) .Marland Kitchen... Take 1 tablet by mouth once a day  BP today: 152/88 Prior BP: 146/85 (10/26/2006)  Prior 10 Yr Risk Heart Disease: 24 % (04/20/2006)  Labs Reviewed: Creat: 1.27 (10/22/2006) Chol: 259 (08/27/2006)   HDL: 46 (08/27/2006)   LDL: 176 (08/27/2006)   TG: 186 (08/27/2006)   Problem # 2:  HYPERLIPIDEMIA (ICD-272.4) I will increase her pravastatin to 40 mg daily as no changes had been made since her lipid panel had ben checked. The following medications were removed from the medication list:    Pravachol 20 Mg Tabs (Pravastatin sodium) .Marland Kitchen... Take 1 tablet by mouth at bedtime  Her updated medication list for this problem includes:    Pravachol 40 Mg Tabs (Pravastatin sodium) .Marland Kitchen... Take 1 tablet by mouth once a day  Labs Reviewed: Chol: 259 (08/27/2006)   HDL: 46 (08/27/2006)   LDL: 176 (08/27/2006)   TG: 186 (08/27/2006) SGOT: 16 (10/22/2006)   SGPT: 15 (10/22/2006)  Prior 10 Yr Risk Heart Disease: 24 % (04/20/2006)   Problem # 3:  BACK PAIN (ICD-724.5) Chronic, well controlled on tramadol.  Continue as is. The following medications were removed from the medication list:    Flexeril 10 Mg Tab (Cyclobenzaprine hcl) .Marland Kitchen... Take one tablet by mouth three times a day  Her updated medication list for this problem includes:    Tramadol Hcl 50 Mg Tabs (Tramadol hcl) ..... One pill every 6 hours as needed for pain   Problem # 4:  PERIPHERAL NEUROPATHY (ICD-356.9) Causing minimal distress at the moment without weakness.   Will follow clinically  Complete Medication List: 1)  Pravachol 40 Mg Tabs (Pravastatin sodium) .... Take 1 tablet by mouth once a day 2)  Norvasc 10 Mg Tabs (Amlodipine besylate) .... Take 1 tablet by mouth once a day 3)  Lisinopril 20 Mg Tabs (Lisinopril) .... Take 1 tablet by mouth once a day 4)  Tramadol Hcl 50 Mg Tabs (Tramadol hcl) .... One pill every 6  hours as needed for pain 5)  Hydrochlorothiazide 25 Mg Tabs (Hydrochlorothiazide) .... Take 1 tablet by mouth once a day 6)  Albuterol 90 Mcg/act Aers (Albuterol) .... Take 2 puffs every 4 hours as needed  Other Orders: T-Comprehensive Metabolic Panel (A999333)  Future Orders: T-Basic Metabolic Panel (99991111) ... 02/22/2007   Patient Instructions: 1)  Please schedule a follow-up appointment in 1-2 weeks for a blood pressure check and some blood work (kidneys) 2)  You need to lose weight. Consider a lower calorie diet and regular exercise.     Prescriptions: ALBUTEROL 90 MCG/ACT  AERS (ALBUTEROL) Take 2 puffs every 4 hours as needed  #1 x prn   Entered and Authorized by:   Mohammed Kindle MD   Signed by:   Mohammed Kindle MD on 02/15/2007   Method used:   Electronically sent to ...       [place], [place]  [address]       Ph: [phone] or [phone]       Fax: [phone]   RxID:   [id number] HYDROCHLOROTHIAZIDE 25 MG  TABS (HYDROCHLOROTHIAZIDE) Take 1 tablet by mouth once a day  #30 x 11   Entered and Authorized by:   Mohammed Kindle MD   Signed by:   Mohammed Kindle MD on 02/15/2007   Method used:   Electronically sent to ...       [place], [place]  [address]       Ph: [phone] or [phone]       Fax: [phone]   RxID:   [id number] TRAMADOL HCL 50 MG TABS (TRAMADOL HCL) one pill every 6 hours  as needed for pain  #100 x 3   Entered and Authorized by:   Mohammed Kindle MD   Signed by:   Mohammed Kindle MD on 02/15/2007   Method used:   Electronically sent to ...       [place], [place]  [address]       Ph: [phone] or [phone]       Fax: [phone]   RxID:   [id number] PRAVACHOL 40 MG  TABS (PRAVASTATIN SODIUM) Take 1 tablet by mouth once a day  #30 x 11   Entered and Authorized by:   Mohammed Kindle MD   Signed by:   Mohammed Kindle MD on 02/15/2007   Method used:   Electronically sent to ...       [place], [place]  [address]       Ph: [phone] or [phone]       Fax: [phone]   RxID:   [id number] NORVASC 10 MG TABS (AMLODIPINE BESYLATE) Take 1 tablet by mouth once a day  #30 x 11   Entered and Authorized by:   Mohammed Kindle MD   Signed by:  Mohammed Kindle MD on 02/15/2007   Method used:   Electronically sent to ...       [place], [place]  [address]       Ph: [phone] or [phone]       Fax: [phone]   RxID:   [id number] LISINOPRIL 20 MG  TABS (LISINOPRIL) Take 1 tablet by mouth once a day  #30 x 11   Entered and Authorized by:   Mohammed Kindle MD   Signed by:   Mohammed Kindle MD on 02/15/2007   Method used:   Electronically sent to ...       [facility] [phone]*       [address]       [place], [place]  [address]       Ph: [phone] or [phone]       Fax: [phone]   RxID:   [id number]  ]

## 2010-04-22 NOTE — Assessment & Plan Note (Signed)
Summary: checkup [mkj]   Vital Signs:  Patient Profile:   60 Years Old Female Height:     68 inches (172.72 cm) Weight:      231.7 pounds (105.32 kg) BMI:     35.36 Temp:     98. degrees F (36.67 degrees C) Pulse rate:   75 / minute BP sitting:   123 / 82  (right arm) Cuff size:   large  Pt. in pain?   no  Vitals Entered By: Lucky Rathke NT II (Aug 12, 2007 10:06 AM)              Is Patient Diabetic? No Nutritional Status BMI of > 30 = obese  Have you ever been in a relationship where you felt threatened, hurt or afraid?No   Does patient need assistance? Functional Status Self care Ambulation Normal     PCP:  Caroleen Hamman MD  Chief Complaint:  FOLLOW UP/ NO CONCERS.  History of Present Illness: No complaints. She is here for a scheduled followup appointment. Very pleasant.    Prior Medication List:  PRAVACHOL 40 MG  TABS (PRAVASTATIN SODIUM) Take 1 tablet by mouth once a day NORVASC 10 MG TABS (AMLODIPINE BESYLATE) Take 1 tablet by mouth once a day LISINOPRIL 40 MG  TABS (LISINOPRIL) Take 1 tablet by mouth once a day TRAMADOL HCL 50 MG TABS (TRAMADOL HCL) one pill every 6 hours as needed for pain HYDROCHLOROTHIAZIDE 25 MG  TABS (HYDROCHLOROTHIAZIDE) Take 1 tablet by mouth once a day ALBUTEROL 90 MCG/ACT  AERS (ALBUTEROL) Take 2 puffs every 4 hours as needed AYR SALINE NASAL NETI RINSE 1.57 GM  PACK (SODIUM CHLORIDE-SODIUM BICARB) Use 1-2 times daily as directed   Current Allergies: No known allergies   Past Medical History:    Reviewed history from 05/04/2006 and no changes required:       Depression-seasonal melancholia       Hyperlipidemia       Hypertension, stage II       Back pain with degenerative end plates, lumbar spondylosis       Myopathy etiology unclear, not statin induced   Family History:    Reviewed history from 04/20/2006 and no changes required:       Family History of breast cancer 1st degree relative, Mom died at age 62  Family History of CAD Female 1st degree relative <60       Family History of Colon CA 1st degree relative, Father died at age 13  Social History:    Reviewed history from 04/20/2006 and no changes required:       Single, 2 kids       Alcohol use-no       Drug use-no       Laid off from [employer] after 60yrs    Risk Factors: Tobacco use:  never Drug use:  no Alcohol use:  no Exercise:  no Seatbelt use:  100 %  Colonoscopy History:    Date of Last Colonoscopy:  12/22/2002  Mammogram History:    Date of Last Mammogram:  02/10/2006   Review of Systems  The patient denies fever, weight loss, chest pain, dyspnea on exhertion, abdominal pain, melena, hematochezia, and severe indigestion/heartburn.     Physical Exam  General:     Well-developed,well-nourished,in no acute distress; alert,appropriate and cooperative throughout examination Neck:     No bruits/goiter Lungs:     Normal respiratory effort, chest expands symmetrically. Lungs are clear to auscultation, no crackles  or wheezes. Heart:     Normal rate and regular rhythm. S1 and S2 normal without gallop, murmur, click, rub or other extra sounds. Abdomen:     Bowel sounds positive,abdomen soft and non-tender without masses, organomegaly or hernias noted. Extremities:     2+ left pedal edema and 2+ right pedal edema.      Impression & Recommendations:  Problem # 1:  RENAL INSUFFICIENCY, MILD (ICD-588.9) BMET today to follow.  Orders: T-Basic Metabolic Panel (99991111)   Problem # 2:  HEALTH MAINTENANCE EXAM (ICD-V70.0) No further PAP smears given complete hysterectomy. Mammogram due in 10/09. Colonoscopy done 2 years ago....will obtain records and place in chart.   Problem # 3:  HYPERTENSION (ICD-401.9) Excellent BP. Continue current regimen.  Her updated medication list for this problem includes:    Norvasc 10 Mg Tabs (Amlodipine besylate) .Marland Kitchen... Take 1 tablet by mouth once a day     Lisinopril 40 Mg Tabs (Lisinopril) .Marland Kitchen... Take 1 tablet by mouth once a day    Hydrochlorothiazide 25 Mg Tabs (Hydrochlorothiazide) .Marland Kitchen... Take 1 tablet by mouth once a day  Orders: T-Basic Metabolic Panel (99991111)  BP today: 123/82 Prior BP: 139/89 (06/23/2007)  Prior 10 Yr Risk Heart Disease: 24 % (04/20/2006)  Labs Reviewed: Creat: 1.13 (06/23/2007) Chol: 231 (07/08/2007)   HDL: 46 (07/08/2007)   LDL: 156 (07/08/2007)   TG: 143 (07/08/2007)   Problem # 4:  HYPERLIPIDEMIA (ICD-272.4) Have increased her pravastatin to 80 mg max dose. Will need to recheck FLP in about 8 weeks.  Her updated medication list for this problem includes:    Pravastatin Sodium 80 Mg Tabs (Pravastatin sodium) .Marland Kitchen... Take 1 tab by mouth at bedtime  Labs Reviewed: Chol: 231 (07/08/2007)   HDL: 46 (07/08/2007)   LDL: 156 (07/08/2007)   TG: 143 (07/08/2007) SGOT: 20 (02/15/2007)   SGPT: 18 (02/15/2007)  Prior 10 Yr Risk Heart Disease: 24 % (04/20/2006)   Complete Medication List: 1)  Pravastatin Sodium 80 Mg Tabs (Pravastatin sodium) .... Take 1 tab by mouth at bedtime 2)  Norvasc 10 Mg Tabs (Amlodipine besylate) .... Take 1 tablet by mouth once a day 3)  Lisinopril 40 Mg Tabs (Lisinopril) .... Take 1 tablet by mouth once a day 4)  Tramadol Hcl 50 Mg Tabs (Tramadol hcl) .... One pill every 6 hours as needed for pain 5)  Hydrochlorothiazide 25 Mg Tabs (Hydrochlorothiazide) .... Take 1 tablet by mouth once a day 6)  Albuterol 90 Mcg/act Aers (Albuterol) .... Take 2 puffs every 4 hours as needed 7)  Ayr Saline Nasal Neti Rinse 1.57 Gm Pack (Sodium chloride-sodium bicarb) .... Use 1-2 times daily as directed   Patient Instructions: 1)  Please schedule a follow-up appointment in 1 month. 2)  Increase the pravastatin to 80 mg daily. In the meantime you can take 2 tablets of the 40 mg.   Prescriptions: PRAVASTATIN SODIUM 80 MG  TABS (PRAVASTATIN SODIUM) Take 1 tab by mouth at bedtime  #30 x 6   Entered  and Authorized by:   Domingo Mend MD   Signed by:   Domingo Mend MD on 08/12/2007   Method used:   Electronically sent to ...       [facility] [phone]*       [address]       [place], [place]  [address]       Ph: [phone] or [phone]  Fax: [phone]   [id number] TRAMADOL HCL 50 MG TABS (TRAMADOL HCL) one pill every 6 hours as needed for pain  #100 x 3   Entered and Authorized by:   Domingo Mend MD   Signed by:   Domingo Mend MD on 08/12/2007   Method used:   Electronically sent to ...       [facility] [phone]*       [address]       [place], [place]  [address]       Ph: [phone] or [phone]       Fax: [phone]   RxID:   [id number]  ]

## 2010-04-22 NOTE — Assessment & Plan Note (Signed)
Summary: CHECKUP/ SB.   Vital Signs:  Patient Profile:   60 Years Old Female Height:     68 inches (172.72 cm) Weight:      230.7 pounds (104.86 kg) BMI:     35.20 Temp:     97.5 degrees F (36.39 degrees C) oral Pulse rate:   86 / minute BP sitting:   131 / 80  (left arm)  Pt. in pain?   yes    Location:   back    Intensity:   8  Vitals Entered By: Nadine Counts Deborra Medina) (April 18, 2007 3:47 PM)              Is Patient Diabetic? No Nutritional Status BMI of > 30 = obese  Have you ever been in a relationship where you felt threatened, hurt or afraid?No   Does patient need assistance? Functional Status Self care Ambulation Normal     PCP:  Caroleen Hamman MD  Chief Complaint:  bp ck and lab results.  History of Present Illness: Nocomplaints. Here for Bp check.  Current Allergies (reviewed today): No known allergies     Risk Factors: Tobacco use:  never Drug use:  no Alcohol use:  no Exercise:  no Seatbelt use:  100 %  Colonoscopy History:    Date of Last Colonoscopy:  12/22/2002  Mammogram History:    Date of Last Mammogram:  02/10/2006   Review of Systems  The patient denies fever, weight loss, chest pain, dyspnea on exhertion, abdominal pain, and severe indigestion/heartburn.     Physical Exam  General:     Well-developed,well-nourished,in no acute distress; alert,appropriate and cooperative throughout examination Neck:     No bruits/goiter Lungs:     Normal respiratory effort, chest expands symmetrically. Lungs are clear to auscultation, no crackles or wheezes. Heart:     Normal rate and regular rhythm. S1 and S2 normal without gallop, murmur, click, rub or other extra sounds. Extremities:     No clubbing, cyanosis. 1+ pitting edema of bilateral feet, does not reach ankles.    Impression & Recommendations:  Problem # 1:  HYPERTENSION (ICD-401.9) Good BP on increased dose of lisinopril. Will have her return in 1 month for a repeat  BmET, since she does have some mild CRI to follow renal function.  Her updated medication list for this problem includes:    Norvasc 10 Mg Tabs (Amlodipine besylate) .Marland Kitchen... Take 1 tablet by mouth once a day    Lisinopril 40 Mg Tabs (Lisinopril) .Marland Kitchen... Take 1 tablet by mouth once a day    Hydrochlorothiazide 25 Mg Tabs (Hydrochlorothiazide) .Marland Kitchen... Take 1 tablet by mouth once a day  Future Orders: T-Basic Metabolic Panel (99991111) ... 05/19/2007  BP today: 131/80 Prior BP: 141/77 (03/03/2007)  Prior 10 Yr Risk Heart Disease: 24 % (04/20/2006)  Labs Reviewed: Creat: 1.34 (04/15/2007) Chol: 238 (02/10/2007)   HDL: 43 (02/10/2007)   LDL: 168 (02/10/2007)   TG: 136 (02/10/2007)   Complete Medication List: 1)  Pravachol 40 Mg Tabs (Pravastatin sodium) .... Take 1 tablet by mouth once a day 2)  Norvasc 10 Mg Tabs (Amlodipine besylate) .... Take 1 tablet by mouth once a day 3)  Lisinopril 40 Mg Tabs (Lisinopril) .... Take 1 tablet by mouth once a day 4)  Tramadol Hcl 50 Mg Tabs (Tramadol hcl) .... One pill every 6 hours as needed for pain 5)  Hydrochlorothiazide 25 Mg Tabs (Hydrochlorothiazide) .... Take 1 tablet by mouth once a day 6)  Albuterol  90 Mcg/act Aers (Albuterol) .... Take 2 puffs every 4 hours as needed   Patient Instructions: 1)  Please schedule a follow-up appointment in 3 months.    ]

## 2010-04-22 NOTE — Assessment & Plan Note (Signed)
Summary: ACUTE-BACK AND LEG PAIN([facility])/[facility]B   Vital Signs:  Patient profile:   60 year old female Height:      66.5 inches Weight:      225.7 pounds BMI:     36.01 Temp:     98.7 degrees F Pulse rate:   89 / minute BP sitting:   145 / 91  (left arm) Cuff size:   regular  Vitals Entered By: Yvonna Alanis RN (Jul 31, 2008 8:53 AM) CC: just got out of hospital - could not wait til Jun HFU appt b/c leg pain too intense Is Patient Diabetic? No Pain Assessment Patient in pain? yes     Location: left leg thigh across top aspect and lateral aspect Intensity: sitting - 0 , standing - 10  Type: burning Onset of pain  chronic back issues causing leg pain - Thursday last week was leg pain start Nutritional Status BMI of 25 - 29 = overweight  Have you ever been in a relationship where you felt threatened, hurt or afraid?No   Does patient need assistance? Functional Status Self care Ambulation Impaired:Risk for fall Comments limping favoring left leg, slight shuffling gait   Primary Care Provider:  Caroleen Hamman MD  CC:  just got out of hospital - could not wait til Jun HFU appt b/c leg pain too intense.  History of Present Illness: [name] is a 60 year old Female with PMH listed below, [facility] on 07/06/2008 by Dr. Volanda Napoleon for flank pain which was thought possibly due to nephrlithiasis. There was a renal US which did not show any stone, and the pain resolved. She was subsequently admitted to the hospital for lower back pain on 07/26/08, thought likely due to musculoskeletal cause (negative CT abd/pelvis)  She was scheduled to follow up on 08/29/08, but she presents to clinic today because the pain in her back and leg is still bothering her:  Onset: sudden on thurs (5 days ago) at 2am while in bed. Location: left anterior thigh Duration: basically depends on position Character: deep burning Aggrevating: standing, lying on right side    Alleviating: sitting in chair, lean forward Radiation: none - no pain/parasthesias in the lower leg  Treatments: tried tramadol -- not helping Severity: 10/10 at worst. 0/10 while sitting now.  Denies any rashes in the painful area. She denies any numbness, bowel or bladder incontinence. No weakness, she just sometimes limits her movement due to pain. She reports that she had an injection into her hip for bursitis 20 years ago and wonders if this could help her now.  Depression History:      The patient denies a depressed mood most of the day and a diminished interest in her usual daily activities.        Comments:  states Cymbalta "working very well".   Preventive Screening-Counseling & Management     Smoking Status: never  Current Medications (verified): 1)  Norvasc 10 Mg Tabs (Amlodipine Besylate) .... Take 1 Tablet By Mouth Once A Day 2)  Lisinopril 40 Mg  Tabs (Lisinopril) .... Take 1 Tablet By Mouth Once A Day 3)  Tramadol Hcl 50 Mg Tabs (Tramadol Hcl) .... One Pill Every 4 Hours As Needed For Pain 4)  Hydrochlorothiazide 25 Mg  Tabs (Hydrochlorothiazide) .... Take 1 Tablet By Mouth Once A Day 5)  Albuterol 90 Mcg/act  Aers (Albuterol) .... Take 2 Puffs Every 4 Hours As Needed 6)  Advair Diskus 100-50 Mcg/dose Misc (  Fluticasone-Salmeterol) .... Take 1 Puff Twice Daily 7)  Flonase 50 Mcg/act Susp (Fluticasone Propionate) .... Take 2 Sprays in Each Nostril Once Daily For 4 Days, and Then One Spray in Each Nostril Once Daily For A Week 8)  Pravachol 80 Mg Tabs (Pravastatin Sodium) .... Take One Tablet Daily.  Allergies: 1)  Lipitor  Past History:  Past Medical History:    Depression-seasonal melancholia    Hyperlipidemia    Hypertension, stage II    Back pain with degenerative end plates, lumbar spondylosis    Myopathy etiology unclear, not statin induced     (05/04/2006)  Family History:    Family History of breast cancer 1st degree relative, Mom died at age 30    Family  History of CAD Female 1st degree relative <60    Family History of Colon CA 1st degree relative, Father died at age 42     (05-01-06)  Social History:    Single, 2 kids    never smoker    Alcohol use-no    Drug use-no    Laid off from [employer] after 34yrs   Review of Systems       Denies fever, chills, nausea, vomiting, diarrhea, chest pain, dyspnea, palpitations, cough, edema, vision problems, hearing problems, swallowing problems, rash, mood problems, anxiety or depression.   Physical Exam  General:  alert and overweight-appearing.  Head:  normocephalic and atraumatic.   Eyes:  vision grossly intact, pupils equal, pupils round, and pupils reactive to light.   Ears:  no external deformities.   Nose:  no external deformity.   Mouth:  pharynx pink and moist.   Lungs:  normal respiratory effort, normal breath sounds, no crackles, and no wheezes.   Heart:  normal rate, regular rhythm, and no murmur.   Abdomen:  soft, non-tender, and normal bowel sounds. CVA tenderness on the left side.   Msk:  SLR: pain on left back and thigh reproduced by raising either leg to 30 degrees. Pain in left lower back is reproduced by palpation over the piraformis muscle area. Neurologic:  alert & oriented X3 and cranial nerves II-XII intact.   Strength normal in all extremities, but there is variable effort in the LLE due to pain. DTRs trace and symmetric bilaterally. Gait is somewhat antalgic, but there is no shuffling during my exam. Able to transfer to and from exam table. Skin:  no rashes   Impression & Recommendations:  Problem # 1:  BACK PAIN, CHRONIC (ICD-724.5) Given her recent hospitalization with negative CT, as well as the history and physical findings, I suspect that she may have a component of piriformis syndrome which can cause mild nerve intrapment as well as SI joint dysfunction which could explain pain in the distribution over her left anterior thigh, as well as the low  back pain. Other items in the differential include zoster (but no rash),  renal  (nephrolithiasis, UTI), disk disease (although this was mild on imaging). For now, will treat conservatively with pain medication and muscle relaxants to help her keep moving, referral to PT, alternating ice and heat on the piraformis area.  Will check UA to r/o stones, UTI, though she has had recent negative workup including negative renal US. Instructed her to contact us right away if her symptoms worsen or if she were to have incontinence or other warning signs. Also Rx for cymbalta to help with component of chronic pain. Will have her keep her appointment with her PCP for follow-up.  Her updated medication list for this problem includes:    Tramadol Hcl 50 Mg Tabs (Tramadol hcl) ..... One pill every 4 hours as needed for pain    Darvocet A500 100-500 Mg Tabs (Propoxyphene n-apap) .Marland Kitchen... Take 1 tablet by mouth up to every 6 hours as needed for back pain    Flexeril 5 Mg Tabs (Cyclobenzaprine hcl) .Marland Kitchen... Take 1 tablet by mouth up to two times a day as needed for muscle spasm  Orders: T-Urinalysis [id number]) Physical Therapy Referral (PT)  Problem # 2:  HYPERTENSION (ICD-401.9) BP slightly above goal, but actually better than last visit and patient is in pain today. Will therefore not make any changes at this time.  Her updated medication list for this problem includes:    Norvasc 10 Mg Tabs (Amlodipine besylate) .Marland Kitchen... Take 1 tablet by mouth once a day    Lisinopril 40 Mg Tabs (Lisinopril) .Marland Kitchen... Take 1 tablet by mouth once a day    Hydrochlorothiazide 25 Mg Tabs (Hydrochlorothiazide) .Marland Kitchen... Take 1 tablet by mouth once a day  BP today: 145/91 Prior BP: 157/86 (07/06/2008)  Prior 10 Yr Risk Heart Disease: 24 % (04/20/2006)  Labs Reviewed: K+: 4.2 (04/18/2008) Creat: : 1.13 (04/18/2008)   Chol: 210 (03/21/2008)   HDL: 47 (03/21/2008)   LDL: 125 (03/21/2008)   TG: 190 (03/21/2008)  Complete Medication  List: 1)  Norvasc 10 Mg Tabs (Amlodipine besylate) .... Take 1 tablet by mouth once a day 2)  Lisinopril 40 Mg Tabs (Lisinopril) .... Take 1 tablet by mouth once a day 3)  Tramadol Hcl 50 Mg Tabs (Tramadol hcl) .... One pill every 4 hours as needed for pain 4)  Hydrochlorothiazide 25 Mg Tabs (Hydrochlorothiazide) .... Take 1 tablet by mouth once a day 5)  Albuterol 90 Mcg/act Aers (Albuterol) .... Take 2 puffs every 4 hours as needed 6)  Advair Diskus 100-50 Mcg/dose Misc (Fluticasone-salmeterol) .... Take 1 puff twice daily 7)  Flonase 50 Mcg/act Susp (Fluticasone propionate) .... Take 2 sprays in each nostril once daily for 4 days, and then one spray in each nostril once daily for a week 8)  Pravachol 80 Mg Tabs (Pravastatin sodium) .... Take one tablet daily. 9)  Darvocet A500 100-500 Mg Tabs (Propoxyphene n-apap) .... Take 1 tablet by mouth up to every 6 hours as needed for back pain 10)  Flexeril 5 Mg Tabs (Cyclobenzaprine hcl) .... Take 1 tablet by mouth up to two times a day as needed for muscle spasm 11)  Cymbalta 60 Mg Cpep (Duloxetine hcl) .... Take 1 tablet by mouth once a day  Patient Instructions: 1)  Please keep your appointment with Dr. Volanda Napoleon on 08/29/08 at 1:30pm 2)  Please pick up your darvocet and flexeril and take as directed 3)  Please go to your physical therapy appointment 4)  Please try alternating cold and heat on the muscles of your buttocks to help with your pain. 5)  If your pain is getting worse, of if you have weakness or numbness, please call us right away or come to the hospital. Prescriptions: CYMBALTA 60 MG CPEP (DULOXETINE HCL) Take 1 tablet by mouth once a day  #30 x 11   Entered and Authorized by:   Lajean Saver MD   Signed by:   Lajean Saver MD on 07/31/2008   Method used:   Print then Give to Patient   RxID:   [id number] FLEXERIL 5 MG TABS (CYCLOBENZAPRINE HCL) Take 1 tablet by mouth up to two times a  day as needed for muscle spasm  #60 x 0    Entered and Authorized by:   Lajean Saver MD   Signed by:   Lajean Saver MD on 07/31/2008   Method used:   Print then Give to Patient   RxID:   [id number] DARVOCET A500 100-500 MG TABS (PROPOXYPHENE N-APAP) take 1 tablet by mouth up to every 6 hours as needed for back pain  #60 x 0   Entered and Authorized by:   Lajean Saver MD   Signed by:   Lajean Saver MD on 07/31/2008   Method used:   Print then Give to Patient   RxID:   [id number]

## 2010-04-22 NOTE — Assessment & Plan Note (Signed)
Summary: EAR PROBLEM/SB.   Vital Signs:  Patient profile:   60 year old female Height:      66.5 inches (168.91 cm) Weight:      228.0 pounds (103.64 kg) BMI:     36.38 Temp:     98.2 degrees F oral Pulse rate:   86 / minute BP sitting:   126 / 71  (right arm) Cuff size:   regular  Vitals Entered By: Morrison Old RN (February 21, 2010 10:08 AM) CC: Hemorrhoids and change in stools.  Is Patient Diabetic? No Pain Assessment Patient in pain? no      Nutritional Status BMI of > 30 = obese  Have you ever been in a relationship where you felt threatened, hurt or afraid?Unable to ask; daughter w/pt.   Does patient need assistance? Functional Status Self care Ambulation Normal   Primary Care Provider:  Lester [place] MD  CC:  Hemorrhoids and change in stools. .  History of Present Illness: 1. Left breast adenocarcinoma; sp lumpectomy on 02/04/2010 by Dr. Sherrin Daisy. Patient is being followed by Dr. Patrice Paradise at the cancer center on 03/27/2009 ->? adjuvant chemotherapy. Patient denies any pain, fever, chills or weight loss. 2. Hx of Pulmonary nodule --being followed by Dr. Patrice Paradise, per patient. 3. C/o "hemorroids." no melena, hematechezia, rectal or abdominal pain, fever, chills or weight loss. Last colonoscopy in 2004 WNL.  Depression History:      The patient denies a depressed mood most of the day and a diminished interest in her usual daily activities.         Preventive Screening-Counseling & Management  Alcohol-Tobacco     Alcohol drinks/day: 0     Smoking Status: never     Passive Smoke Exposure: no  Caffeine-Diet-Exercise     Does Patient Exercise: no  Current Medications (verified): 1)  Norvasc 10 Mg Tabs (Amlodipine Besylate) .... Take 1 Tablet By Mouth Once A Day 2)  Lisinopril 40 Mg  Tabs (Lisinopril) .... Take 1 Tablet By Mouth Once A Day 3)  Hydrochlorothiazide 25 Mg  Tabs (Hydrochlorothiazide) .... Take 1 Tablet By Mouth Once A Day 4)  Albuterol 90  Mcg/act  Aers (Albuterol) .... Take 2 Puffs Every 4 Hours As Needed 5)  Advair Diskus 100-50 Mcg/dose Misc (Fluticasone-Salmeterol) .... Take 1 Puff Twice Daily 6)  Ativan 1 Mg Tabs (Lorazepam) .... Take One Tablet As Needed For Panic Attack. 7)  Pantoprazole Sodium 40 Mg Tbec (Pantoprazole Sodium) .... Take 1 Tablet By Mouth Once A Day 8)  Tramadol Hcl 50 Mg Tabs (Tramadol Hcl) .Marland Kitchen.. 1 Tablet Every 6 Hrs As Needed For Pain 9)  Hydrocod Polst-Chlorphen Polst 10-8 Mg/43ml Lqcr (Hydrocod Polst-Chlorphen Polst) .... Take 1 Teaspoon Two Times A Day  Allergies (verified): 1)  ! Crestor 2)  Lipitor  Past History:  Past medical, surgical, family and social histories (including risk factors) reviewed for relevance to current acute and chronic problems.  Past Medical History: Reviewed history from 05/04/2006 and no changes required. Depression-seasonal melancholia Hyperlipidemia Hypertension, stage II Back pain with degenerative end plates, lumbar spondylosis Myopathy etiology unclear, not statin induced  Past Surgical History: Reviewed history from 10/22/2006 and no changes required. Hysterectomy for fibroids Cervical laminectomy c5-c6, c4-c5 left  nitka 2005 Oophorectomy  Family History: Reviewed history from 04/20/2006 and no changes required. Family History of breast cancer 1st degree relative, Mom died at age 39 Family History of CAD Female 1st degree relative <60 Family History of Colon CA 1st degree  relative, Father died at age 33  Social History: Reviewed history from 07/31/2008 and no changes required. Single, 2 kids never smoker Alcohol use-no Drug use-no Laid off from [employer] after 34yrs   Review of Systems       ROS is negative  Physical Exam  General:  alert, well-developed, well-nourished, and well-hydrated.   Eyes:  pupils equal, pupils round, pupils reactive to light, and pupils react to accomodation.   Mouth:  good dentition and pharynx pink and  moist.   Neck:  supple and full ROM.   Lungs:  normal respiratory effort, no intercostal retractions, no accessory muscle use, normal breath sounds, no dullness, no fremitus, no crackles, and no wheezes.   Heart:  normal rate, regular rhythm, no murmur, no gallop, no rub, and no JVD.   Abdomen:  soft, non-tender, normal bowel sounds, no distention, and no masses.   Rectal:  No external abnormalities noted. Normal sphincter tone. No rectal masses or tenderness.external hemorrhoid at 7 o clock position without thrombosis, erythema, dicharge opr TTP  Msk:  normal ROM, no joint tenderness, and no joint swelling.   Pulses:  2+pulses b/l Extremities:  no edema, no clubbing Neurologic:  alert & oriented X3 and gait normal.   Cervical Nodes:  no anterior cervical adenopathy and no posterior cervical adenopathy.   Inguinal Nodes:  no R inguinal adenopathy and no L inguinal adenopathy.   Psych:  Oriented X3, memory intact for recent and remote, normally interactive, good eye contact, not depressed appearing, and slightly anxious.     Impression & Recommendations:  Problem # 1:  INVASIVE DUCTAL CARCINOMA, LEFT BREAST (ICD-174.9) Assessment Unchanged sp left breast lumpectomy. has a follow up appointment at cancer center. Will folow up on the recs.  Problem # 2:  HYPERTENSION (ICD-401.9) Assessment: Improved  Her updated medication list for this problem includes:    Norvasc 10 Mg Tabs (Amlodipine besylate) .Marland Kitchen... Take 1 tablet by mouth once a day    Lisinopril 40 Mg Tabs (Lisinopril) .Marland Kitchen... Take 1 tablet by mouth once a day    Hydrochlorothiazide 25 Mg Tabs (Hydrochlorothiazide) .Marland Kitchen... Take 1 tablet by mouth once a day  BP today: 126/71 Prior BP: 151/91 (01/27/2010)  Prior 10 Yr Risk Heart Disease: 24 % (04/20/2006)  Labs Reviewed: K+: 3.9 (04/26/2009) Creat: : 1.17 (04/26/2009)   Chol: 258 (04/26/2009)   HDL: 42 (04/26/2009)   LDL: 177 (04/26/2009)   TG: 193 (04/26/2009)  Problem # 3:   EXTERNAL HEMORRHOIDS WITHOUT MENTION COMP (ICD-455.3) No signs of inflmammation. Patient is up to date on colonoscopy; no red flag signs. Patient is reasssured.  Complete Medication List: 1)  Norvasc 10 Mg Tabs (Amlodipine besylate) .... Take 1 tablet by mouth once a day 2)  Lisinopril 40 Mg Tabs (Lisinopril) .... Take 1 tablet by mouth once a day 3)  Hydrochlorothiazide 25 Mg Tabs (Hydrochlorothiazide) .... Take 1 tablet by mouth once a day 4)  Albuterol 90 Mcg/act Aers (Albuterol) .... Take 2 puffs every 4 hours as needed 5)  Advair Diskus 100-50 Mcg/dose Misc (Fluticasone-salmeterol) .... Take 1 puff twice daily 6)  Ativan 1 Mg Tabs (Lorazepam) .... Take one tablet as needed for panic attack. 7)  Pantoprazole Sodium 40 Mg Tbec (Pantoprazole sodium) .... Take 1 tablet by mouth once a day 8)  Tramadol Hcl 50 Mg Tabs (Tramadol hcl) .Marland Kitchen.. 1 tablet every 6 hrs as needed for pain 9)  Hydrocod Polst-chlorphen Polst 10-8 Mg/49ml Lqcr (Hydrocod polst-chlorphen polst) .... Take  1 teaspoon two times a day  Patient Instructions: 1)  Please, return to clinic in February on en empty stomach. 2)  Please, call with any questions.   Orders Added: 1)  Est. Patient Level III [id number]    Prevention & Chronic Care Immunizations   Influenza vaccine: Historical  (12/30/2009)   Influenza vaccine due: 11/22/2010    Tetanus booster: 02/20/2002: Historical   Tetanus booster due: 02/21/2012    Pneumococcal vaccine: Historical  (02/21/2004)   Pneumococcal vaccine due: 02/20/2009    H. zoster vaccine: Not documented   H. zoster vaccine deferral: Refused  (10/15/2009)  Colorectal Screening   Hemoccult: Negative  (03/03/2005)   Hemoccult action/deferral: Not indicated  (10/10/2008)    Colonoscopy: Normal  (12/22/2002)   Colonoscopy action/deferral: scheduled with [facility]  (05/04/2006)   Colonoscopy due: 12/21/2012  Other Screening   Pap smear: Not documented   Pap smear action/deferral: Not indicated  S/P hysterectomy  (10/10/2008)    Mammogram: BI-RADS CATEGORY 1:  Negative.^MM DIGITAL SCREENING UNILAT R  (01/14/2010)   Mammogram due: 03/12/2010    DXA bone density scan: Not documented   Smoking status: never  (02/21/2010)  Lipids   Total Cholesterol: 258  (04/26/2009)   Lipid panel action/deferral: Lipid Panel ordered   LDL: 177  (04/26/2009)   LDL Direct: Not documented   HDL: 42  (04/26/2009)   Triglycerides: 193  (04/26/2009)    SGOT (AST): 18  (04/26/2009)   BMP action: Ordered   SGPT (ALT): 17  (04/26/2009)   Alkaline phosphatase: 58  (04/26/2009)   Total bilirubin: 0.5  (04/26/2009)  Hypertension   Last Blood Pressure: 126 / 71  (02/21/2010)   Serum creatinine: 1.17  (04/26/2009)   BMP action: Ordered   Serum potassium 3.9  (04/26/2009)  Self-Management Support :   Personal Goals (by the next clinic visit) :      Personal blood pressure goal: 140/90  (04/26/2009)     Personal LDL goal: 100  (04/26/2009)    Patient will work on the following items until the next clinic visit to reach self-care goals:     Medications and monitoring: take my medicines every day, bring all of my medications to every visit  (02/21/2010)     Eating: use fresh or frozen vegetables, eat foods that are low in salt, eat baked foods instead of fried foods  (02/21/2010)     Activity: take a 30 minute walk every day  (02/21/2010)    Hypertension self-management support: Written self-care plan  (02/21/2010)   Hypertension self-care plan printed.    Lipid self-management support: Written self-care plan  (02/21/2010)   Lipid self-care plan printed.

## 2010-04-22 NOTE — Progress Notes (Signed)
----   Converted from flag ---- ---- 06/16/2006 9:08 AM, Deborra Medina MD wrote: Bonnita Nasuti,  Ms. Tessler's form was not signed.  We do not fill out disability forms for our patients,. (conflict of interest).  If she calls, refer her to [facility] for disability evaluation.  Thanks.  Form was given to Lela to handle.  TT ------------------------------

## 2010-04-22 NOTE — Assessment & Plan Note (Signed)
Summary: CHECKUP/ SB.   Vital Signs:  Patient Profile:   60 Years Old Female Height:     68 inches (172.72 cm) Weight:      229.7 pounds (104.41 kg) BMI:     35.05 Temp:     98.1 degrees F (36.72 degrees C) oral Pulse rate:   87 / minute BP sitting:   142 / 81  (right arm) Cuff size:   large  Pt. in pain?   yes    Location:   lower back    Intensity:   8  Vitals Entered By: Nadine Counts Deborra Medina) (February 22, 2008 9:32 AM)              Is Patient Diabetic? No Nutritional Status BMI of > 30 = obese  Have you ever been in a relationship where you felt threatened, hurt or afraid?No   Does patient need assistance? Functional Status Self care Ambulation Normal     PCP:  Caroleen Hamman MD  Chief Complaint:  routine f/u (seen in ER recently for asthma).  History of Present Illness: 60yo female who has been having sinusitis and shortness of breath.  She had PFT's a month ago which indicated asthma (she responded to methacholine challenge).  She was started on advair two weeks ago, but doesn't feel like it is working.  She was given a predpac by the ED a week ago, which she feels worked instantly.  She has been using her albuterol inhaler everyday and loves it.  She also was treated for a sinus infection two weeks ago and reports significant drainage and now is clear, feeling well.    Prior Medications Reviewed Using: List Brought by Patient  Updated Prior Medication List: PRAVASTATIN SODIUM 80 MG  TABS (PRAVASTATIN SODIUM) Take 1 tab by mouth at bedtime NORVASC 10 MG TABS (AMLODIPINE BESYLATE) Take 1 tablet by mouth once a day LISINOPRIL 40 MG  TABS (LISINOPRIL) Take 1 tablet by mouth once a day TRAMADOL HCL 50 MG TABS (TRAMADOL HCL) one pill every 6 hours as needed for pain HYDROCHLOROTHIAZIDE 25 MG  TABS (HYDROCHLOROTHIAZIDE) Take 1 tablet by mouth once a day ALBUTEROL 90 MCG/ACT  AERS (ALBUTEROL) Take 2 puffs every 4 hours as needed TUSSIONEX PENNKINETIC ER 8-10 MG/5ML  LQCR (CHLORPHENIRAMINE-HYDROCODONE) Take one teaspoon by mouth once every 12 hours as needed for cough BENZONATATE 100 MG CAPS (BENZONATATE) Take one capsule by mouth once every 4 hours as needed for cough ZITHROMAX 250 MG TABS (AZITHROMYCIN) Take 2 tabs by mouth today, and then one tab by mouth ADVAIR DISKUS 100-50 MCG/DOSE MISC (FLUTICASONE-SALMETEROL) Take 1 puff twice daily FLONASE 50 MCG/ACT SUSP (FLUTICASONE PROPIONATE) Take 2 sprays in each nostril once daily for 4 days, and then one spray in each nostril once daily for a week  Current Allergies (reviewed today): No known allergies     Risk Factors: Tobacco use:  never Drug use:  no Alcohol use:  no Exercise:  no Seatbelt use:  100 %  Colonoscopy History:    Date of Last Colonoscopy:  12/22/2002  Mammogram History:    Date of Last Mammogram:  02/10/2006   Review of Systems       per hpi   Physical Exam  General:     alert and overweight-appearing.   Head:     normocephalic and atraumatic.   Eyes:     vision grossly intact, pupils equal, pupils round, and pupils reactive to light.   Ears:  R ear normal and L ear normal.  TM's clear Nose:     no external deformity.  some edema in R nostril.  scant mucus. Mouth:     good dentition and pharynx pink and moist.   Lungs:     normal respiratory effort, normal breath sounds, and no wheezes.   Heart:     normal rate, regular rhythm, and no murmur.   Abdomen:     soft and normal bowel sounds.   Pulses:     +1 Extremities:     no edema Neurologic:     alert & oriented X3 and cranial nerves II-XII intact.   Skin:     turgor normal, color normal, and no rashes.   Cervical Nodes:     no anterior cervical adenopathy and no posterior cervical adenopathy.   Psych:     Oriented X3, memory intact for recent and remote, and normally interactive.      Impression & Recommendations:  Problem # 1:  COUGH VARIANT ASTHMA (ICD-493.82) Doing very well today, but she  is using her albuterol inhaler all the time.  We discussed that she should use advair daily and NOT albuterol.  If she uses albuterol more than two times a day more than two days a week her regimen is failing and she should come in.  She seemed to understand.  This is on her discharge instructions.  Will fu in 3 mo or as needed for this issue.  Her updated medication list for this problem includes:    Albuterol 90 Mcg/act Aers (Albuterol) .Marland Kitchen... Take 2 puffs every 4 hours as needed    Advair Diskus 100-50 Mcg/dose Misc (Fluticasone-salmeterol) .Marland Kitchen... Take 1 puff twice daily   Problem # 2:  HYPERTENSION (ICD-401.9) She is working hard to lose weight and monitor her diet.  It seems to be working.  She is adverse to medication change at this point.  Will conintue with lifestyle.  Her updated medication list for this problem includes:    Norvasc 10 Mg Tabs (Amlodipine besylate) .Marland Kitchen... Take 1 tablet by mouth once a day    Lisinopril 40 Mg Tabs (Lisinopril) .Marland Kitchen... Take 1 tablet by mouth once a day    Hydrochlorothiazide 25 Mg Tabs (Hydrochlorothiazide) .Marland Kitchen... Take 1 tablet by mouth once a day  BP today: 142/81 Prior BP: 155/92 (02/09/2008)  Prior 10 Yr Risk Heart Disease: 24 % (04/20/2006)  Labs Reviewed: Creat: 1.30 (12/09/2007) Chol: 231 (07/08/2007)   HDL: 46 (07/08/2007)   LDL: 156 (07/08/2007)   TG: 143 (07/08/2007)   Problem # 3:  HYPERLIPIDEMIA (ICD-272.4) Still on pravastatin.  Trying to change diet doing well.   Her updated medication list for this problem includes:    Pravastatin Sodium 80 Mg Tabs (Pravastatin sodium) .Marland Kitchen... Take 1 tab by mouth at bedtime  Labs Reviewed: Chol: 231 (07/08/2007)   HDL: 46 (07/08/2007)   LDL: 156 (07/08/2007)   TG: 143 (07/08/2007) SGOT: 21 (12/09/2007)   SGPT: 20 (12/09/2007)  Prior 10 Yr Risk Heart Disease: 24 % (04/20/2006)   Complete Medication List: 1)  Pravastatin Sodium 80 Mg Tabs (Pravastatin sodium) .... Take 1 tab by mouth at bedtime 2)   Norvasc 10 Mg Tabs (Amlodipine besylate) .... Take 1 tablet by mouth once a day 3)  Lisinopril 40 Mg Tabs (Lisinopril) .... Take 1 tablet by mouth once a day 4)  Tramadol Hcl 50 Mg Tabs (Tramadol hcl) .... One pill every 6 hours as needed for pain 5)  Hydrochlorothiazide  25 Mg Tabs (Hydrochlorothiazide) .... Take 1 tablet by mouth once a day 6)  Albuterol 90 Mcg/act Aers (Albuterol) .... Take 2 puffs every 4 hours as needed 7)  Tussionex Pennkinetic Er 8-10 Mg/99ml Lqcr (Chlorpheniramine-hydrocodone) .... Take one teaspoon by mouth once every 12 hours as needed for cough 8)  Benzonatate 100 Mg Caps (Benzonatate) .... Take one capsule by mouth once every 4 hours as needed for cough 9)  Zithromax 250 Mg Tabs (Azithromycin) .... Take 2 tabs by mouth today, and then one tab by mouth 10)  Advair Diskus 100-50 Mcg/dose Misc (Fluticasone-salmeterol) .... Take 1 puff twice daily 11)  Flonase 50 Mcg/act Susp (Fluticasone propionate) .... Take 2 sprays in each nostril once daily for 4 days, and then one spray in each nostril once daily for a week   Patient Instructions: 1)  You are doing very well! 2)  For your asthma: use your Advair inhaler twice EVERY DAY to prevent exacerbations.  Use your albuterol inhaler only when you need to.  If you are using your albuterol more than two times a day more than two days a week than call the clinic because your asthma program is not working. 3)  Continue the good work with your healthy diet and exercise! 4)  Please schedule a follow-up appointment in 3 months.   ]

## 2010-04-22 NOTE — Progress Notes (Signed)
Summary: phone/gg   *  Phone Note Call from Patient   Caller: Patient Summary of Call: Pt c/o Wheezing and SOB.  using albuterol without releif.  Onset 3 days ago, not getting better.   Seen in ED on August 10 for same signs.  Was given antibiotic. Pt wants to be seen before it gets any worse. Pt # [id number] Initial call taken by: Gevena Cotton RN,  December 09, 2007 12:59 PM  Follow-up for Phone Call        Talked to pt and she will come over now, she will be here within the hour. Follow-up by: Gevena Cotton RN,  December 09, 2007 2:28 PM

## 2010-04-22 NOTE — Letter (Signed)
Summary: [facility]  [facility]   Imported By: Garlan Fillers 07/18/2009 12:06:06  _____________________________________________________________________  External Attachment:    Type:   Image     Comment:   External Document

## 2010-04-22 NOTE — Assessment & Plan Note (Signed)
Summary: EST-CK/FU/MEDS/CFB   Vital Signs:  Patient profile:   60 year old female Height:      66.5 inches (168.91 cm) Weight:      223.8 pounds (101.73 kg) BMI:     35.71 Temp:     97.3 degrees F (36.28 degrees C) oral Pulse rate:   103 / minute BP sitting:   137 / 80  (right arm)  Vitals Entered By: Hilda Blades Ditzler RN (October 10, 2008 2:20 PM) CC: Depression Is Patient Diabetic? No Pain Assessment Patient in pain? yes     Location: back Intensity: 7 Onset of pain  long time Nutritional Status BMI of > 30 = obese Nutritional Status Detail appetite good  Have you ever been in a relationship where you felt threatened, hurt or afraid?denies   Does patient need assistance? Functional Status Self care Ambulation Normal Comments FU - went to ER recently - anxiety. ? labs.   Primary Care Provider:  Caroleen Hamman MD  CC:  Depression.  History of Present Illness: This is a  year old woman with past medical history of HTN, HLD, back pain who is here after an ED visit for anxiety attack. She has had several severe anxiety attackes in the past, and these have all way preceded a mojor tragic event in her life (death of a loved one)   Several days ago she woke up short of breath and felt like she might "not make it".  This feeling lasted for lasted for 15 minutes.  Her son drove her to the ED where work up was negative and she was discharged.   When asked about current life stressors she reports that her daughter recently got married and she is not happy about the husband.  Thinking about this more she realized that the night before the panic attack she had been in a long painful argument with her daughter about the way that the new husband treats her grandaughter (now step daughter to this man).   She was deeply upset during the argument and felt that her heart was pounding for much of the time.  she did not sleep well that night.  She thinks that this might have been the cause of her  symptoms.  She has had no symptoms since and she has made amends with her daughter.  Depression History:      The patient denies a depressed mood most of the day and a diminished interest in her usual daily activities.         Preventive Screening-Counseling & Management  Alcohol-Tobacco     Alcohol drinks/day: 0     Smoking Status: never     Passive Smoke Exposure: no  Caffeine-Diet-Exercise     Does Patient Exercise: no  Medications Prior to Update: 1)  Norvasc 10 Mg Tabs (Amlodipine Besylate) .... Take 1 Tablet By Mouth Once A Day 2)  Lisinopril 40 Mg  Tabs (Lisinopril) .... Take 1 Tablet By Mouth Once A Day 3)  Tramadol Hcl 50 Mg Tabs (Tramadol Hcl) .... One Pill Every 4 Hours As Needed For Pain 4)  Hydrochlorothiazide 25 Mg  Tabs (Hydrochlorothiazide) .... Take 1 Tablet By Mouth Once A Day 5)  Albuterol 90 Mcg/act  Aers (Albuterol) .... Take 2 Puffs Every 4 Hours As Needed 6)  Advair Diskus 100-50 Mcg/dose Misc (Fluticasone-Salmeterol) .... Take 1 Puff Twice Daily 7)  Flonase 50 Mcg/act Susp (Fluticasone Propionate) .... Take 2 Sprays in Each Nostril Once Daily For 4 Days, and Then  One Spray in Each Nostril Once Daily For A Week 8)  Lipitor 80 Mg Tabs (Atorvastatin Calcium) .... Take One Tablet Daily To Lower Cholesterol. 9)  Mucinex Dm 30-600 Mg Xr12h-Tab (Dextromethorphan-Guaifenesin) 10)  Cetirizine Hcl 10 Mg Tabs (Cetirizine Hcl) .... Take One Tablet Daily For Allergy Symtpms.  Allergies: 1)  Lipitor  Review of Systems       per hpi  Physical Exam  General:  alert and overweight-appearing.   Lungs:  normal respiratory effort and normal breath sounds.   Heart:  normal rate, regular rhythm, and no murmur.   Psych:  Oriented X3, memory intact for recent and remote, normally interactive, and good eye contact.     Impression & Recommendations:  Problem # 1:  HYPERLIPIDEMIA (B2193296.4) She has been on pravachol and still has high cholesterol. Lipitor upsets her  stomach.  She has been on crestor in the past without problems. Will stop pravachol and start crestor.  Her updated medication list for this problem includes:    Crestor 40 Mg Tabs (Rosuvastatin calcium) .Marland Kitchen... Take one tablet dailiy for cholesterol.  Problem # 2:  PULMONARY NODULE (ICD-518.89) Small nodule in right lower lobe most likely a calcified granuloma.  repeat chest xray in 6 months.  Problem # 3:  HYPERTENSION (ICD-401.9) BP slightly higher than prvious.  no change today.  Her updated medication list for this problem includes:    Norvasc 10 Mg Tabs (Amlodipine besylate) .Marland Kitchen... Take 1 tablet by mouth once a day    Lisinopril 40 Mg Tabs (Lisinopril) .Marland Kitchen... Take 1 tablet by mouth once a day    Hydrochlorothiazide 25 Mg Tabs (Hydrochlorothiazide) .Marland Kitchen... Take 1 tablet by mouth once a day  BP today: 137/80 Prior BP: 120/74 (09/12/2008)  Prior 10 Yr Risk Heart Disease: 24 % (04/20/2006)  Labs Reviewed: K+: 4.2 (09/12/2008) Creat: : 1.57 (09/12/2008)   Chol: 218 (09/12/2008)   HDL: 45 (09/12/2008)   LDL: 131 (09/12/2008)   TG: 208 (09/12/2008)  Problem # 4:  PANIC ATTACK (ICD-300.01) Discussed this issue at length.  She does not want any medical intervention and prefers to work towards decreasing her stressors.  My only concern is the shortness of breath, she does have a hx of asthma and may have symptoms under stress.  She will try her inhaler if she has these symptoms again.  I agree with her strategy of stress reduction rather than medical managment.  Complete Medication List: 1)  Norvasc 10 Mg Tabs (Amlodipine besylate) .... Take 1 tablet by mouth once a day 2)  Lisinopril 40 Mg Tabs (Lisinopril) .... Take 1 tablet by mouth once a day 3)  Tramadol Hcl 50 Mg Tabs (Tramadol hcl) .... One pill every 4 hours as needed for pain 4)  Hydrochlorothiazide 25 Mg Tabs (Hydrochlorothiazide) .... Take 1 tablet by mouth once a day 5)  Albuterol 90 Mcg/act Aers (Albuterol) .... Take 2 puffs every  4 hours as needed 6)  Advair Diskus 100-50 Mcg/dose Misc (Fluticasone-salmeterol) .... Take 1 puff twice daily 7)  Flonase 50 Mcg/act Susp (Fluticasone propionate) .... Take 2 sprays in each nostril once daily for 4 days, and then one spray in each nostril once daily for a week 8)  Crestor 40 Mg Tabs (Rosuvastatin calcium) .... Take one tablet dailiy for cholesterol. 9)  Cetirizine Hcl 10 Mg Tabs (Cetirizine hcl) .... Take one tablet daily for allergy symtpms.  Patient Instructions: 1)  Please schedule a follow-up appointment in 6 months. Prescriptions: CRESTOR 40 MG TABS (  ROSUVASTATIN CALCIUM) Take one tablet dailiy for cholesterol.  #32 x 6   Entered and Authorized by:   Myrtis Ser MD   Signed by:   Myrtis Ser MD on 10/10/2008   Method used:   Electronically to        [facility] [phone]* (retail)       [place], [place]  [id number]       Ph: [phone] or [phone]       Fax: [phone]   RxID:   [id number]   Prevention & Chronic Care Immunizations   Influenza vaccine: Fluvax 3+  (12/30/2007)    Tetanus booster: 02/20/2002: Historical    Pneumococcal vaccine: Historical  (02/21/2004)  Colorectal Screening   Hemoccult: Negative  (03/03/2005)   Hemoccult action/deferral: Not indicated  (10/10/2008)    Colonoscopy: Normal  (12/22/2002)   Colonoscopy action/deferral: scheduled with [facility].  (05/04/2006)   Colonoscopy due: 12/21/2012  Other Screening   Pap smear: Not documented   Pap smear action/deferral: Not indicated S/P hysterectomy  (10/10/2008)    Mammogram: No specific mammographic evidence of malignancy.    (03/12/2008)   Mammogram due: 03/12/2010   Smoking status: never  (10/10/2008)  Lipids   Total Cholesterol: 218  (09/12/2008)   LDL: 131  (09/12/2008)   LDL Direct: Not documented   HDL: 45  (09/12/2008)   Triglycerides: 208  (09/12/2008)    SGOT (AST): 17  (09/12/2008)   SGPT  (ALT): 17  (09/12/2008)   Alkaline phosphatase: 59  (09/12/2008)   Total bilirubin: 0.5  (09/12/2008)    Lipid flowsheet reviewed?: Yes   Progress toward LDL goal: Unchanged  Hypertension   Last Blood Pressure: 137 / 80  (10/10/2008)   Serum creatinine: 1.57  (09/12/2008)   Serum potassium 4.2  (09/12/2008)  Self-Management Support :    Patient will work on the following items until the next clinic visit to reach self-care goals:     Medications and monitoring: take my medicines every day, bring all of my medications to every visit  (10/10/2008)     Eating: drink diet soda or water instead of juice or soda, eat more vegetables, use fresh or frozen vegetables, eat foods that are low in salt, eat fruit for snacks and desserts, limit or avoid alcohol  (10/10/2008)    Hypertension self-management support: Not documented    Lipid self-management support: Not documented

## 2010-04-22 NOTE — Progress Notes (Signed)
Summary: med refill/gp  Phone Note Refill Request Message from:  Patient on May 06, 2009 2:01 PM  Pt. request a Rx for Lorazepam 1 mg - 3 times a day as needed for anxiety attacks.  She said she had talked to you about this at the last visit.   Method Requested: Telephone to Pharmacy Initial call taken by: Morrison Old RN,  May 06, 2009 2:01 PM  Follow-up for Phone Call        We did discuss this at her last visit.  prescription is signed.  please call in.  thank you.    New/Updated Medications: ATIVAN 1 MG TABS (LORAZEPAM) Take one tablet as needed for panic attack. Prescriptions: ATIVAN 1 MG TABS (LORAZEPAM) Take one tablet as needed for panic attack.  #15 x 0   Entered by:   Myrtis Ser MD   Authorized by:   Morrison Old RN   Signed by:   Myrtis Ser MD on 05/08/2009   Method used:   Telephoned to ...       [facility] [phone]* (retail)       [place], [place]  [id number]       Ph: [phone] or [phone]       Fax: [phone]   RxID:   [id number]   Appended Document: med refill/gp Prescriptions: ATIVAN 1 MG TABS (LORAZEPAM) Take one tablet as needed for panic attack.  #15 x 0   Entered and Authorized by:   Myrtis Ser MD   Signed by:   Myrtis Ser MD on 05/08/2009   Method used:   Telephoned to ...       [facility] [phone]* (retail)       [place], [place]  [id number]       Ph: [phone] or [phone]       Fax: [phone]   RxID:   [id number]  Dr. Volanda Napoleon, can you change Authorized by me to your name?  Thanks  Appended Document: med refill/gp Above Rx called to [facility] pharmacy.  Pt. called and made awared.

## 2010-04-22 NOTE — Progress Notes (Signed)
Summary: phone/gg  ** att  Phone Note Call from Patient   Caller: Patient Summary of Call: Pt called c/o sinus infection x3 days.  throat scratchy, productive cough, wheezing, left eye pain. some ,SOB. denies fever, nausea or vomiting.Marland Kitchen    Has taken coriceden without relief. please advise Pt # [id number] Initial call taken by: Gevena Cotton RN,  May 09, 2007 10:56 AM  Follow-up for Phone Call        Will place in one of today's slots.  Follow-up by: Luane School MD,  May 09, 2007 11:32 AM

## 2010-04-23 ENCOUNTER — Encounter (HOSPITAL_BASED_OUTPATIENT_CLINIC_OR_DEPARTMENT_OTHER): Payer: Medicare Other | Admitting: Oncology

## 2010-04-23 DIAGNOSIS — E785 Hyperlipidemia, unspecified: Secondary | ICD-10-CM

## 2010-04-23 DIAGNOSIS — C50919 Malignant neoplasm of unspecified site of unspecified female breast: Secondary | ICD-10-CM

## 2010-04-23 DIAGNOSIS — I10 Essential (primary) hypertension: Secondary | ICD-10-CM

## 2010-04-23 LAB — CBC WITH DIFFERENTIAL/PLATELET
BASO%: 0.1 % (ref 0.0–2.0)
Basophils Absolute: 0 10*3/uL (ref 0.0–0.1)
EOS%: 0.5 % (ref 0.0–7.0)
Eosinophils Absolute: 0 10*3/uL (ref 0.0–0.5)
HCT: 31 % — ABNORMAL LOW (ref 34.8–46.6)
HGB: 10.4 g/dL — ABNORMAL LOW (ref 11.6–15.9)
LYMPH%: 43.1 % (ref 14.0–49.7)
MCH: 26.9 pg (ref 25.1–34.0)
MCHC: 33.6 g/dL (ref 31.5–36.0)
MCV: 80.1 fL (ref 79.5–101.0)
MONO#: 0.2 10*3/uL (ref 0.1–0.9)
MONO%: 6.7 % (ref 0.0–14.0)
NEUT#: 1.8 10*3/uL (ref 1.5–6.5)
NEUT%: 49.6 % (ref 38.4–76.8)
Platelets: 305 10*3/uL (ref 145–400)
RBC: 3.87 10*6/uL (ref 3.70–5.45)
RDW: 14.5 % (ref 11.2–14.5)
WBC: 3.7 10*3/uL — ABNORMAL LOW (ref 3.9–10.3)
lymph#: 1.6 10*3/uL (ref 0.9–3.3)

## 2010-04-23 LAB — COMPREHENSIVE METABOLIC PANEL
ALT: 26 U/L (ref 0–35)
AST: 23 U/L (ref 0–37)
Albumin: 3.8 g/dL (ref 3.5–5.2)
Alkaline Phosphatase: 66 U/L (ref 39–117)
BUN: 25 mg/dL — ABNORMAL HIGH (ref 6–23)
CO2: 26 mEq/L (ref 19–32)
Calcium: 9.4 mg/dL (ref 8.4–10.5)
Chloride: 103 mEq/L (ref 96–112)
Creatinine, Ser: 1.42 mg/dL — ABNORMAL HIGH (ref 0.40–1.20)
Glucose, Bld: 117 mg/dL — ABNORMAL HIGH (ref 70–99)
Potassium: 4.4 mEq/L (ref 3.5–5.3)
Sodium: 138 mEq/L (ref 135–145)
Total Bilirubin: 0.6 mg/dL (ref 0.3–1.2)
Total Protein: 6.5 g/dL (ref 6.0–8.3)

## 2010-04-24 NOTE — Assessment & Plan Note (Signed)
Summary: HFU/SB.   Vital Signs:  Patient profile:   60 year old female Height:      66.5 inches (168.91 cm) Weight:      232.3 pounds (105.59 kg) BMI:     37.07 Temp:     98.9 degrees F oral Pulse rate:   118 / minute BP sitting:   155 / 91  (right arm) Cuff size:   large  Vitals Entered By: Morrison Old RN (March 06, 2010 9:18 AM) CC: Hospital f/u. Pt. re-started HCTZ and Lisinopri today.  Is Patient Diabetic? No Pain Assessment Patient in pain? no      Nutritional Status BMI of > 30 = obese  Have you ever been in a relationship where you felt threatened, hurt or afraid?No   Does patient need assistance? Functional Status Self care Ambulation Normal   Primary Care Provider:  Lester [place] MD  CC:  Hospital f/u. Pt. re-started HCTZ and Lisinopri today. Marland Kitchen  History of Present Illness: 1. Follow up from a hospitalization: Please, refer to a D/ C summary. Patient reports "feeling 100% better." Denies any other concerns. 2. HTN:  patient was taken off HCTz and lisinopril while being in the hospital.  Depression History:      The patient denies a depressed mood most of the day.         Preventive Screening-Counseling & Management  Alcohol-Tobacco     Alcohol drinks/day: 0     Smoking Status: never     Passive Smoke Exposure: no  Caffeine-Diet-Exercise     Does Patient Exercise: no  Current Problems (verified): 1)  Hypertension  (ICD-401.9) 2)  Hyperlipidemia  (ICD-272.4) 3)  Abdominal Pain, Left Upper Quadrant  (ICD-789.02) 4)  External Hemorrhoids Without Mention Comp  (ICD-455.3) 5)  Invasive Ductal Carcinoma, Left Breast  (ICD-174.9) 6)  Gerd  (ICD-530.81) 7)  Sleep Disorder  (ICD-780.50) 8)  Panic Attack  (ICD-300.01) 9)  Pulmonary Nodule  (ICD-518.89) 10)  Back Pain, Chronic  (ICD-724.5) 11)  Cough Variant Asthma  (ICD-493.82) 12)  Renal Insufficiency, Mild  (A999333) 13)  Diastolic Dysfunction  (123XX123) 14)  Family History of Colon Ca 1st  Degree Relative <60  (ICD-V16.0) 15)  Family History of Cad Female 1st Degree Relative <60  (ICD-V16.49) 16)  Family Hsitory Breast Cancer 1st Degree Relative <50  (ICD-V16.3) 17)  Depression  (ICD-311) 18)  Health Maintenance Exam  (ICD-V70.0)  Current Medications (verified): 1)  Norvasc 10 Mg Tabs (Amlodipine Besylate) .... Take 1 Tablet By Mouth Once A Day 2)  Lisinopril 40 Mg  Tabs (Lisinopril) .... Hold 3)  Hydrochlorothiazide 25 Mg  Tabs (Hydrochlorothiazide) .... Hold 4)  Albuterol 90 Mcg/act  Aers (Albuterol) .... Take 2 Puffs Every 4 Hours As Needed 5)  Advair Diskus 100-50 Mcg/dose Misc (Fluticasone-Salmeterol) .... Take 1 Puff Twice Daily 6)  Ativan 1 Mg Tabs (Lorazepam) .... Take One Tablet As Needed For Panic Attack. 7)  Pantoprazole Sodium 40 Mg Tbec (Pantoprazole Sodium) .... Take 1 Tablet By Mouth Once A Day  Allergies (verified): 1)  ! Crestor 2)  Lipitor  Physical Exam  General:  alert, well-developed, well-nourished, and well-hydrated.   Head:  normocephalic and atraumatic.   Eyes:  no icterus or pallor bilaterally Mouth:  dry mucosa Neck:  supple and full ROM.   Lungs:  normal respiratory effort, no intercostal retractions, no accessory muscle use, normal breath sounds, no dullness, no fremitus, no crackles, and no wheezes.   Heart:  normal rate, regular rhythm,  no murmur, no gallop, no rub, and no JVD.   Abdomen:  obese, BS hypoactive; soft, no guarding; acute LEQ and RLQ abdominal TTP noted; no rebound. Msk:  normal ROM, no joint tenderness, and no joint swelling.   Pulses:  2+pulses b/l Extremities:  no edema, no clubbing Neurologic:  alert & oriented X3.   Skin:  turgor normal, color normal, no rashes, and no ulcerations.   Psych:  Oriented X3, memory intact for recent and remote, good eye contact, not anxious appearing, and not depressed appearing.     Impression & Recommendations:  Problem # 1:  HYPERTENSION (ICD-401.9) Assessment Comment Only Will  resatrt Lisinopril and HCTZ that were stopped during recent hospitalization for illeitis. will recheck next visit. Her updated medication list for this problem includes:    Norvasc 10 Mg Tabs (Amlodipine besylate) .Marland Kitchen... Take 1 tablet by mouth once a day    Lisinopril 40 Mg Tabs (Lisinopril) ..... Hold    Hydrochlorothiazide 25 Mg Tabs (Hydrochlorothiazide) ..... Hold  BP today: 155/91 Prior BP: 134/72 (02/24/2010)  Prior 10 Yr Risk Heart Disease: 24 % (04/20/2006)  Labs Reviewed: K+: 3.9 (04/26/2009) Creat: : 1.17 (04/26/2009)   Chol: 258 (04/26/2009)   HDL: 42 (04/26/2009)   LDL: 177 (04/26/2009)   TG: 193 (04/26/2009)  Problem # 2:  HYPERLIPIDEMIA (ICD-272.4) Assessment: Unchanged diet, exercise and weight managment reviewed. Labs Reviewed: SGOT: 18 (04/26/2009)   SGPT: 17 (04/26/2009)  Prior 10 Yr Risk Heart Disease: 24 % (04/20/2006)   HDL:42 (04/26/2009), 45 (09/12/2008)  LDL:177 (04/26/2009), 131 (09/12/2008)  Chol:258 (04/26/2009), 218 (09/12/2008)  Trig:193 (04/26/2009), 208 (09/12/2008)  Problem # 3:  INVASIVE DUCTAL CARCINOMA, LEFT BREAST (ICD-174.9) Assessment: Comment Only Patient is to see Dr. Patrice Paradise at the Cancer center next week.  Problem # 4:  ABDOMINAL PAIN, LEFT UPPER QUADRANT (ICD-789.02)  2/2 illeitis. etiology is still unknown but patient's Sx resolved and a full work up during a recent hospitalization did not reveal any pathology.  Discussed symptom control with the patient.   Complete Medication List: 1)  Norvasc 10 Mg Tabs (Amlodipine besylate) .... Take 1 tablet by mouth once a day 2)  Lisinopril 40 Mg Tabs (Lisinopril) .... Hold 3)  Hydrochlorothiazide 25 Mg Tabs (Hydrochlorothiazide) .... Hold 4)  Albuterol 90 Mcg/act Aers (Albuterol) .... Take 2 puffs every 4 hours as needed 5)  Advair Diskus 100-50 Mcg/dose Misc (Fluticasone-salmeterol) .... Take 1 puff twice daily 6)  Ativan 1 Mg Tabs (Lorazepam) .... Take one tablet as needed for panic attack. 7)   Pantoprazole Sodium 40 Mg Tbec (Pantoprazole sodium) .... Take 1 tablet by mouth once a day  Patient Instructions: 1)  Dear Ms. Zale, 2)  Please, take all your medications as was discussed. 3)  Please,  call with any concerns. 4)  Please, make an appointment on as needed basis. 5)  Merry Christmas and a Happy New Year!!!! Prescriptions: PANTOPRAZOLE SODIUM 40 MG TBEC (PANTOPRAZOLE SODIUM) Take 1 tablet by mouth once a day  #31 x 11   Entered and Authorized by:   Milana Obey MD   Signed by:   Milana Obey MD on 03/06/2010   Method used:   Electronically to        [facility] [phone]* (retail)       [place], [place]  [id number]       Ph: [phone] or [phone]  Fax: [phone]   [id number] HYDROCHLOROTHIAZIDE 25 MG  TABS (HYDROCHLOROTHIAZIDE) HOLD  #30 x 11   Entered and Authorized by:   Milana Obey MD   Signed by:   Milana Obey MD on 03/06/2010   Method used:   Electronically to        [facility] [phone]* (retail)       [place], [place]  [id number]       Ph: [phone] or [phone]       Fax: [phone]   RxID:   [id number] LISINOPRIL 40 MG  TABS (LISINOPRIL) Hold  #30 x 11   Entered and Authorized by:   Milana Obey MD   Signed by:   Milana Obey MD on 03/06/2010   Method used:   Electronically to        [facility] [phone]* (retail)       [place], [place]  [id number]       Ph: [phone] or [phone]       Fax: [phone]   RxID:   [id number]    Orders Added: 1)  Est. Patient Level III [id number]      Prevention & Chronic Care Immunizations   Influenza vaccine: Historical  (12/30/2009)   Influenza vaccine due: 11/22/2010    Tetanus booster: 02/20/2002: Historical   Tetanus booster due: 02/21/2012    Pneumococcal vaccine:  Historical  (02/21/2004)   Pneumococcal vaccine due: 02/20/2009    H. zoster vaccine: Not documented   H. zoster vaccine deferral: Refused  (10/15/2009)  Colorectal Screening   Hemoccult: Negative  (03/03/2005)   Hemoccult action/deferral: Not indicated  (10/10/2008)    Colonoscopy: Normal  (12/22/2002)   Colonoscopy action/deferral: scheduled with G.I.  (05/04/2006)   Colonoscopy due: 12/21/2012  Other Screening   Pap smear: Not documented   Pap smear action/deferral: Not indicated S/P hysterectomy  (10/10/2008)    Mammogram: BI-RADS CATEGORY 1:  Negative.^MM DIGITAL SCREENING UNILAT R  (01/14/2010)   Mammogram due: 03/12/2010    DXA bone density scan: Not documented   Smoking status: never  (03/06/2010)  Lipids   Total Cholesterol: 258  (04/26/2009)   Lipid panel action/deferral: Lipid Panel ordered   LDL: 177  (04/26/2009)   LDL Direct: Not documented   HDL: 42  (04/26/2009)   Triglycerides: 193  (04/26/2009)    SGOT (AST): 18  (04/26/2009)   BMP action: Ordered   SGPT (ALT): 17  (04/26/2009)   Alkaline phosphatase: 58  (04/26/2009)   Total bilirubin: 0.5  (04/26/2009)    Lipid flowsheet reviewed?: Yes   Progress toward LDL goal: Unchanged    Stage of readiness to change (lipid management): Action  Hypertension   Last Blood Pressure: 155 / 91  (03/06/2010)   Serum creatinine: 1.17  (04/26/2009)   BMP action: Ordered   Serum potassium 3.9  (123456)   Basic metabolic panel due: 99991111    Hypertension flowsheet reviewed?: Yes   Progress toward BP goal: Deteriorated    Stage of readiness to change (hypertension management): Action  Self-Management Support :   Personal Goals (by the next clinic visit) :      Personal blood pressure goal: 140/90  (04/26/2009)     Personal LDL goal: 100  (04/26/2009)    Patient will  work on the following items until the next clinic visit to reach self-care goals:     Medications and monitoring: take my medicines  every day, bring all of my medications to every visit  (03/06/2010)     Eating: eat more vegetables, use fresh or frozen vegetables, eat foods that are low in salt, eat baked foods instead of fried foods  (03/06/2010)     Activity: take a 30 minute walk every day  (02/21/2010)    Hypertension self-management support: Written self-care plan  (03/06/2010)   Hypertension self-care plan printed.    Lipid self-management support: Written self-care plan  (02/21/2010)

## 2010-04-24 NOTE — Consult Note (Signed)
[facility]   Imported By: Garlan Fillers 03/05/2010 10:03:55  _____________________________________________________________________  External Attachment:    Type:   Image     Comment:   External Document

## 2010-04-24 NOTE — Consult Note (Signed)
[facility]   Imported By: Garlan Fillers 04/02/2010 11:48:15  _____________________________________________________________________  External Attachment:    Type:   Image     Comment:   External Document

## 2010-04-24 NOTE — Letter (Signed)
[facility]   Imported By: Garlan Fillers 03/11/2010 10:58:06  _____________________________________________________________________  External Attachment:    Type:   Image     Comment:   External Document

## 2010-04-25 ENCOUNTER — Encounter (HOSPITAL_BASED_OUTPATIENT_CLINIC_OR_DEPARTMENT_OTHER): Payer: Medicare Other | Admitting: Oncology

## 2010-04-25 DIAGNOSIS — Z5111 Encounter for antineoplastic chemotherapy: Secondary | ICD-10-CM

## 2010-04-25 DIAGNOSIS — Z5112 Encounter for antineoplastic immunotherapy: Secondary | ICD-10-CM

## 2010-04-25 DIAGNOSIS — C50919 Malignant neoplasm of unspecified site of unspecified female breast: Secondary | ICD-10-CM

## 2010-04-30 ENCOUNTER — Other Ambulatory Visit: Payer: Self-pay | Admitting: Oncology

## 2010-04-30 ENCOUNTER — Encounter (HOSPITAL_BASED_OUTPATIENT_CLINIC_OR_DEPARTMENT_OTHER): Payer: Medicare Other | Admitting: Oncology

## 2010-04-30 DIAGNOSIS — E785 Hyperlipidemia, unspecified: Secondary | ICD-10-CM

## 2010-04-30 DIAGNOSIS — C50919 Malignant neoplasm of unspecified site of unspecified female breast: Secondary | ICD-10-CM

## 2010-04-30 DIAGNOSIS — I10 Essential (primary) hypertension: Secondary | ICD-10-CM

## 2010-04-30 LAB — CBC WITH DIFFERENTIAL/PLATELET
BASO%: 1 % (ref 0.0–2.0)
Basophils Absolute: 0 10*3/uL (ref 0.0–0.1)
EOS%: 0.8 % (ref 0.0–7.0)
Eosinophils Absolute: 0 10*3/uL (ref 0.0–0.5)
HCT: 31.3 % — ABNORMAL LOW (ref 34.8–46.6)
HGB: 10.4 g/dL — ABNORMAL LOW (ref 11.6–15.9)
LYMPH%: 45.5 % (ref 14.0–49.7)
MCH: 26.1 pg (ref 25.1–34.0)
MCHC: 33.2 g/dL (ref 31.5–36.0)
MCV: 78.4 fL — ABNORMAL LOW (ref 79.5–101.0)
MONO#: 0.3 10*3/uL (ref 0.1–0.9)
MONO%: 7.2 % (ref 0.0–14.0)
NEUT#: 1.8 10*3/uL (ref 1.5–6.5)
NEUT%: 45.5 % (ref 38.4–76.8)
Platelets: 281 10*3/uL (ref 145–400)
RBC: 3.99 10*6/uL (ref 3.70–5.45)
RDW: 14.4 % (ref 11.2–14.5)
WBC: 3.9 10*3/uL (ref 3.9–10.3)
lymph#: 1.8 10*3/uL (ref 0.9–3.3)
nRBC: 0 % (ref 0–0)

## 2010-04-30 LAB — BASIC METABOLIC PANEL
BUN: 22 mg/dL (ref 6–23)
CO2: 27 mEq/L (ref 19–32)
Calcium: 9 mg/dL (ref 8.4–10.5)
Chloride: 103 mEq/L (ref 96–112)
Creatinine, Ser: 1.47 mg/dL — ABNORMAL HIGH (ref 0.40–1.20)
Glucose, Bld: 122 mg/dL — ABNORMAL HIGH (ref 70–99)
Potassium: 4 mEq/L (ref 3.5–5.3)
Sodium: 139 mEq/L (ref 135–145)

## 2010-05-02 ENCOUNTER — Encounter (HOSPITAL_BASED_OUTPATIENT_CLINIC_OR_DEPARTMENT_OTHER): Payer: Medicare Other | Admitting: Oncology

## 2010-05-02 DIAGNOSIS — Z5112 Encounter for antineoplastic immunotherapy: Secondary | ICD-10-CM

## 2010-05-02 DIAGNOSIS — C50919 Malignant neoplasm of unspecified site of unspecified female breast: Secondary | ICD-10-CM

## 2010-05-07 ENCOUNTER — Other Ambulatory Visit: Payer: Self-pay | Admitting: Oncology

## 2010-05-07 ENCOUNTER — Encounter (HOSPITAL_BASED_OUTPATIENT_CLINIC_OR_DEPARTMENT_OTHER): Payer: Medicare Other | Admitting: Oncology

## 2010-05-07 DIAGNOSIS — I10 Essential (primary) hypertension: Secondary | ICD-10-CM

## 2010-05-07 DIAGNOSIS — R0609 Other forms of dyspnea: Secondary | ICD-10-CM

## 2010-05-07 DIAGNOSIS — C50919 Malignant neoplasm of unspecified site of unspecified female breast: Secondary | ICD-10-CM

## 2010-05-07 DIAGNOSIS — E785 Hyperlipidemia, unspecified: Secondary | ICD-10-CM

## 2010-05-07 LAB — COMPREHENSIVE METABOLIC PANEL
ALT: 27 U/L (ref 0–35)
AST: 23 U/L (ref 0–37)
Albumin: 3.9 g/dL (ref 3.5–5.2)
Alkaline Phosphatase: 77 U/L (ref 39–117)
BUN: 23 mg/dL (ref 6–23)
CO2: 25 mEq/L (ref 19–32)
Calcium: 9.1 mg/dL (ref 8.4–10.5)
Chloride: 107 mEq/L (ref 96–112)
Creatinine, Ser: 1.63 mg/dL — ABNORMAL HIGH (ref 0.40–1.20)
Glucose, Bld: 127 mg/dL — ABNORMAL HIGH (ref 70–99)
Potassium: 4 mEq/L (ref 3.5–5.3)
Sodium: 142 mEq/L (ref 135–145)
Total Bilirubin: 0.4 mg/dL (ref 0.3–1.2)
Total Protein: 5.9 g/dL — ABNORMAL LOW (ref 6.0–8.3)

## 2010-05-07 LAB — CBC WITH DIFFERENTIAL/PLATELET
BASO%: 0.8 % (ref 0.0–2.0)
Basophils Absolute: 0 10*3/uL (ref 0.0–0.1)
EOS%: 0.7 % (ref 0.0–7.0)
Eosinophils Absolute: 0 10*3/uL (ref 0.0–0.5)
HCT: 28.7 % — ABNORMAL LOW (ref 34.8–46.6)
HGB: 9.7 g/dL — ABNORMAL LOW (ref 11.6–15.9)
LYMPH%: 35.6 % (ref 14.0–49.7)
MCH: 27.2 pg (ref 25.1–34.0)
MCHC: 33.7 g/dL (ref 31.5–36.0)
MCV: 80.6 fL (ref 79.5–101.0)
MONO#: 0.4 10*3/uL (ref 0.1–0.9)
MONO%: 11.3 % (ref 0.0–14.0)
NEUT#: 1.9 10*3/uL (ref 1.5–6.5)
NEUT%: 51.6 % (ref 38.4–76.8)
Platelets: 183 10*3/uL (ref 145–400)
RBC: 3.56 10*6/uL — ABNORMAL LOW (ref 3.70–5.45)
RDW: 15.7 % — ABNORMAL HIGH (ref 11.2–14.5)
WBC: 3.7 10*3/uL — ABNORMAL LOW (ref 3.9–10.3)
lymph#: 1.3 10*3/uL (ref 0.9–3.3)

## 2010-05-09 ENCOUNTER — Encounter (HOSPITAL_BASED_OUTPATIENT_CLINIC_OR_DEPARTMENT_OTHER): Payer: Medicare Other | Admitting: Oncology

## 2010-05-09 DIAGNOSIS — Z5112 Encounter for antineoplastic immunotherapy: Secondary | ICD-10-CM

## 2010-05-09 DIAGNOSIS — Z5111 Encounter for antineoplastic chemotherapy: Secondary | ICD-10-CM

## 2010-05-09 DIAGNOSIS — C50919 Malignant neoplasm of unspecified site of unspecified female breast: Secondary | ICD-10-CM

## 2010-05-14 ENCOUNTER — Other Ambulatory Visit: Payer: Self-pay | Admitting: Oncology

## 2010-05-14 ENCOUNTER — Encounter (HOSPITAL_BASED_OUTPATIENT_CLINIC_OR_DEPARTMENT_OTHER): Payer: Medicare Other | Admitting: Oncology

## 2010-05-14 DIAGNOSIS — E785 Hyperlipidemia, unspecified: Secondary | ICD-10-CM

## 2010-05-14 DIAGNOSIS — C50919 Malignant neoplasm of unspecified site of unspecified female breast: Secondary | ICD-10-CM

## 2010-05-14 DIAGNOSIS — I10 Essential (primary) hypertension: Secondary | ICD-10-CM

## 2010-05-14 LAB — COMPREHENSIVE METABOLIC PANEL
ALT: 29 U/L (ref 0–35)
AST: 27 U/L (ref 0–37)
Albumin: 3.8 g/dL (ref 3.5–5.2)
Alkaline Phosphatase: 64 U/L (ref 39–117)
BUN: 28 mg/dL — ABNORMAL HIGH (ref 6–23)
CO2: 27 mEq/L (ref 19–32)
Calcium: 9.1 mg/dL (ref 8.4–10.5)
Chloride: 104 mEq/L (ref 96–112)
Creatinine, Ser: 1.55 mg/dL — ABNORMAL HIGH (ref 0.40–1.20)
Glucose, Bld: 97 mg/dL (ref 70–99)
Potassium: 4.3 mEq/L (ref 3.5–5.3)
Sodium: 140 mEq/L (ref 135–145)
Total Bilirubin: 0.7 mg/dL (ref 0.3–1.2)
Total Protein: 5.9 g/dL — ABNORMAL LOW (ref 6.0–8.3)

## 2010-05-14 LAB — CBC WITH DIFFERENTIAL/PLATELET
BASO%: 1.2 % (ref 0.0–2.0)
Basophils Absolute: 0 10*3/uL (ref 0.0–0.1)
EOS%: 1.1 % (ref 0.0–7.0)
Eosinophils Absolute: 0 10*3/uL (ref 0.0–0.5)
HCT: 28.7 % — ABNORMAL LOW (ref 34.8–46.6)
HGB: 9.7 g/dL — ABNORMAL LOW (ref 11.6–15.9)
LYMPH%: 54.3 % — ABNORMAL HIGH (ref 14.0–49.7)
MCH: 27 pg (ref 25.1–34.0)
MCHC: 33.7 g/dL (ref 31.5–36.0)
MCV: 80.2 fL (ref 79.5–101.0)
MONO#: 0.2 10*3/uL (ref 0.1–0.9)
MONO%: 5 % (ref 0.0–14.0)
NEUT#: 1.3 10*3/uL — ABNORMAL LOW (ref 1.5–6.5)
NEUT%: 38.4 % (ref 38.4–76.8)
Platelets: 103 10*3/uL — ABNORMAL LOW (ref 145–400)
RBC: 3.58 10*6/uL — ABNORMAL LOW (ref 3.70–5.45)
RDW: 15.7 % — ABNORMAL HIGH (ref 11.2–14.5)
WBC: 3.4 10*3/uL — ABNORMAL LOW (ref 3.9–10.3)
lymph#: 1.8 10*3/uL (ref 0.9–3.3)

## 2010-05-15 ENCOUNTER — Other Ambulatory Visit: Payer: Self-pay | Admitting: Internal Medicine

## 2010-05-15 ENCOUNTER — Encounter: Payer: Self-pay | Admitting: Internal Medicine

## 2010-05-15 ENCOUNTER — Ambulatory Visit (INDEPENDENT_AMBULATORY_CARE_PROVIDER_SITE_OTHER): Payer: Medicare Other | Admitting: Internal Medicine

## 2010-05-15 DIAGNOSIS — R000 Tachycardia, unspecified: Secondary | ICD-10-CM

## 2010-05-15 DIAGNOSIS — C50919 Malignant neoplasm of unspecified site of unspecified female breast: Secondary | ICD-10-CM

## 2010-05-15 DIAGNOSIS — R0609 Other forms of dyspnea: Secondary | ICD-10-CM

## 2010-05-15 LAB — BRAIN NATRIURETIC PEPTIDE: Pro B Natriuretic peptide (BNP): 4.4 pg/mL (ref 0.0–100.0)

## 2010-05-16 ENCOUNTER — Telehealth (INDEPENDENT_AMBULATORY_CARE_PROVIDER_SITE_OTHER): Payer: Self-pay | Admitting: *Deleted

## 2010-05-16 ENCOUNTER — Encounter (HOSPITAL_BASED_OUTPATIENT_CLINIC_OR_DEPARTMENT_OTHER): Payer: Medicare Other | Admitting: Oncology

## 2010-05-16 DIAGNOSIS — C50919 Malignant neoplasm of unspecified site of unspecified female breast: Secondary | ICD-10-CM

## 2010-05-16 DIAGNOSIS — Z5112 Encounter for antineoplastic immunotherapy: Secondary | ICD-10-CM

## 2010-05-16 DIAGNOSIS — Z5111 Encounter for antineoplastic chemotherapy: Secondary | ICD-10-CM

## 2010-05-19 ENCOUNTER — Ambulatory Visit (HOSPITAL_COMMUNITY): Payer: Medicare Other | Attending: Internal Medicine

## 2010-05-19 ENCOUNTER — Encounter: Payer: Self-pay | Admitting: Internal Medicine

## 2010-05-19 DIAGNOSIS — Z0389 Encounter for observation for other suspected diseases and conditions ruled out: Secondary | ICD-10-CM | POA: Insufficient documentation

## 2010-05-19 DIAGNOSIS — Z5111 Encounter for antineoplastic chemotherapy: Secondary | ICD-10-CM

## 2010-05-20 NOTE — Progress Notes (Signed)
Summary: muga appt  Phone Note Outgoing Call   Call placed by: Eliezer Lofts, EMT-P,  May 16, 2010 1:20 PM Action Taken: Phone Call Completed Summary of Call: Left message ref: MUGA instructions. Eliezer Lofts, EMT-P  May 16, 2010 1:20 PM

## 2010-05-20 NOTE — Assessment & Plan Note (Signed)
Summary: np6/ breast cancer-mb  per heather rn pt have medicare-mb -- ...   Visit Type:  Follow-up Primary Provider:  Lester [place] MD  CC:  shortness of breath recently.  History of Present Illness: [name] is a 60 y/o women with h/o obesity, HTN and HL. Referred by Dr. Humphrey Rolls for management of cardiac status during Herceptin therapy.  Found to have L ductal breast CA. ER/PR +. HER 2 neu was equivocal. s/p bilateral mastectomies in 11/11. Lymph nodes negative.  Has had 4 cycles of chemo already. Unsure of what chemo regimen she is undergoing but appears to be getting herceptin. Tolerating well.   Denies any h/o known cardiac disease. had stress test some years which she said was OK. Echo April 10, 2010 EF 55-60%. Grade 1 diastolic dusfunction.  Notes that 2 days ago began having some exertional dyspnea and fatigue. Dr. Humphrey Rolls stopped the HCTZ yesterday to see if that would help. Has long h/o peripheral edema - not worse recently. No orthopnea or PND. No CP.   Problems Prior to Update: 1)  Hypertension  (ICD-401.9) 2)  Hyperlipidemia  (ICD-272.4) 3)  Abdominal Pain, Left Upper Quadrant  (ICD-789.02) 4)  External Hemorrhoids Without Mention Comp  (ICD-455.3) 5)  Invasive Ductal Carcinoma, Left Breast  (ICD-174.9) 6)  Gerd  (ICD-530.81) 7)  Sleep Disorder  (ICD-780.50) 8)  Panic Attack  (ICD-300.01) 9)  Pulmonary Nodule  (ICD-518.89) 10)  Back Pain, Chronic  (ICD-724.5) 11)  Cough Variant Asthma  (ICD-493.82) 12)  Renal Insufficiency, Mild  (A999333) 13)  Diastolic Dysfunction  (123XX123) 14)  Family History of Colon Ca 1st Degree Relative <60  (ICD-V16.0) 15)  Family History of Cad Female 1st Degree Relative <60  (ICD-V16.49) 16)  Family Hsitory Breast Cancer 1st Degree Relative <50  (ICD-V16.3) 17)  Depression  (ICD-311) 18)  Health Maintenance Exam  (ICD-V70.0)  Current Medications (verified): 1)  Norvasc 10 Mg Tabs (Amlodipine Besylate) .... Take 1 Tablet By Mouth Once A Day 2)   Lisinopril 40 Mg  Tabs (Lisinopril) .... Hold 3)  Albuterol 90 Mcg/act  Aers (Albuterol) .... Take 2 Puffs Every 4 Hours As Needed 4)  Advair Diskus 100-50 Mcg/dose Misc (Fluticasone-Salmeterol) .... Take 1 Puff Twice Daily 5)  Ativan 1 Mg Tabs (Lorazepam) .... Take One Tablet As Needed For Panic Attack. 6)  Pantoprazole Sodium 40 Mg Tbec (Pantoprazole Sodium) .... Take 1 Tablet By Mouth Once A Day 7)  Tramadol Hcl 50 Mg Tabs (Tramadol Hcl) .Marland Kitchen.. 1 Tablet Every 6 Hours As Needed  Allergies (verified): 1)  ! Crestor 2)  Lipitor  Past History:  Past Medical History: Last updated: 05/14/2010 1. Depression-seasonal melancholia 2. Hyperlipidemia 3. Hypertension, stage II 4. Back pain with degenerative end plates, lumbar spondylosis 5. Myopathy etiology unclear, not statin induced 6. Breast Cancer  Past Surgical History: Last updated: 05/14/2010 Hysterectomy for fibroids Cervical laminectomy c5-c6, c4-c5 left  nitka 2005 Oophorectomy Mastectomy - bilateral  Family History: Last updated: 04/20/2006 Family History of breast cancer 1st degree relative, Mom died at age 51 Family History of CAD Female 1st degree relative <60 Family History of Colon CA 1st degree relative, Father died at age 54  Social History: Last updated: 07/31/2008 Single, 2 kids never smoker Alcohol use-no Drug use-no Laid off from [employer] after 21yrs   Risk Factors: Alcohol Use: 0 (03/06/2010) Exercise: no (03/06/2010)  Risk Factors: Smoking Status: never (03/06/2010) Passive Smoke Exposure: no (03/06/2010)  Family History: Reviewed history from 04/20/2006 and no changes  required. Family History of breast cancer 1st degree relative, Mom died at age 22 Family History of CAD Female 1st degree relative <60 Family History of Colon CA 1st degree relative, Father died at age 5  Social History: Reviewed history from 07/31/2008 and no changes required. Single, 2 kids never smoker Alcohol  use-no Drug use-no Laid off from [employer] after 47yrs   Review of Systems       As per HPI and past medical history; otherwise all systems negative.   Vital Signs:  Patient profile:   60 year old female Height:      66.5 inches Weight:      229 pounds BMI:     36.54 Pulse rate:   101 / minute BP sitting:   118 / 68  (left arm) Cuff size:   large  Vitals Entered By: Marlis Edelson CNA (May 15, 2010 10:40 AM)  Physical Exam  General:  well appearing. no resp difficulty HEENT: normal Neck: supple. JVP hard to see  ~ 6cm. Carotids 2+ bilat; no bruits. No lymphadenopathy or thryomegaly appreciated. Chest; bilateral mastectomies.  Cor: PMI nondisplaced. Tachy. regular. No rubs, gallops, murmur. Lungs: clear Abdomen: obeses soft, nontender, nondistended. No hepatosplenomegaly. No bruits or masses. Good bowel sounds. Extremities: no cyanosis, clubbing, rash, tr-1+ edema Neuro: alert & orientedx3, cranial nerves grossly intact. moves all 4 extremities w/o difficulty. affect pleasant    Impression & Recommendations:  Problem # 1:  TACHYCARDIA (ICD-785.0) I have reviewed her previous records and it seems like her sinus tach is chronic. However, given fatigue and several rounds of Heceptin we will need to watch closely for incipient LV dysfunction. Will check BNP and MUGA this week or early next. No signifcant volume overload at this point.   Problem # 2:  INVASIVE DUCTAL CARCINOMA, LEFT BREAST (ICD-174.9) Explained to her the rsik of Herceptin cardiotoxicity and our approach to monitoring. Dr. Humphrey Rolls will draw BNP after every round of chemo and we will assess EF q3 months or if any signs of HF or increasing BNP. Already on ACE-I. With sinus tach would have low threshold to add b-blocker though will not add yet given recent fatigue.   Other Orders: EKG w/ Interpretation (93000) TLB-BNP (B-Natriuretic Peptide) (83880-BNPR) MUGA (MUGA)  Patient Instructions: 1)  Lab  today 2)  Your physician has requested that you have a MUGA: A multigated acquisition (MUGA) scan is a test that looks at the chambers and blood vessels of the heart. Please see the CareNotes handout/brochure given to you today for further information. 3)  Follow up in 1 month

## 2010-05-22 ENCOUNTER — Encounter (HOSPITAL_BASED_OUTPATIENT_CLINIC_OR_DEPARTMENT_OTHER): Payer: Medicare Other | Admitting: Oncology

## 2010-05-22 ENCOUNTER — Other Ambulatory Visit: Payer: Self-pay | Admitting: Oncology

## 2010-05-22 DIAGNOSIS — E785 Hyperlipidemia, unspecified: Secondary | ICD-10-CM

## 2010-05-22 DIAGNOSIS — Z170 Estrogen receptor positive status [ER+]: Secondary | ICD-10-CM

## 2010-05-22 DIAGNOSIS — D649 Anemia, unspecified: Secondary | ICD-10-CM

## 2010-05-22 DIAGNOSIS — I10 Essential (primary) hypertension: Secondary | ICD-10-CM

## 2010-05-22 DIAGNOSIS — C50919 Malignant neoplasm of unspecified site of unspecified female breast: Secondary | ICD-10-CM

## 2010-05-22 LAB — CBC WITH DIFFERENTIAL/PLATELET
BASO%: 0.4 % (ref 0.0–2.0)
Basophils Absolute: 0 10*3/uL (ref 0.0–0.1)
EOS%: 0.1 % (ref 0.0–7.0)
Eosinophils Absolute: 0 10*3/uL (ref 0.0–0.5)
HCT: 24.6 % — ABNORMAL LOW (ref 34.8–46.6)
HGB: 8.4 g/dL — ABNORMAL LOW (ref 11.6–15.9)
LYMPH%: 54.4 % — ABNORMAL HIGH (ref 14.0–49.7)
MCH: 27.3 pg (ref 25.1–34.0)
MCHC: 34.3 g/dL (ref 31.5–36.0)
MCV: 79.5 fL (ref 79.5–101.0)
MONO#: 0.1 10*3/uL (ref 0.1–0.9)
MONO%: 4.7 % (ref 0.0–14.0)
NEUT#: 1 10*3/uL — ABNORMAL LOW (ref 1.5–6.5)
NEUT%: 40.4 % (ref 38.4–76.8)
Platelets: 221 10*3/uL (ref 145–400)
RBC: 3.1 10*6/uL — ABNORMAL LOW (ref 3.70–5.45)
RDW: 15.7 % — ABNORMAL HIGH (ref 11.2–14.5)
WBC: 2.6 10*3/uL — ABNORMAL LOW (ref 3.9–10.3)
lymph#: 1.4 10*3/uL (ref 0.9–3.3)

## 2010-05-22 LAB — BASIC METABOLIC PANEL
BUN: 15 mg/dL (ref 6–23)
CO2: 26 mEq/L (ref 19–32)
Calcium: 9 mg/dL (ref 8.4–10.5)
Chloride: 107 mEq/L (ref 96–112)
Creatinine, Ser: 1.55 mg/dL — ABNORMAL HIGH (ref 0.40–1.20)
Glucose, Bld: 107 mg/dL — ABNORMAL HIGH (ref 70–99)
Potassium: 4.3 mEq/L (ref 3.5–5.3)
Sodium: 139 mEq/L (ref 135–145)

## 2010-05-23 ENCOUNTER — Other Ambulatory Visit: Payer: Self-pay | Admitting: Oncology

## 2010-05-23 ENCOUNTER — Encounter (HOSPITAL_BASED_OUTPATIENT_CLINIC_OR_DEPARTMENT_OTHER): Payer: Medicare Other | Admitting: Oncology

## 2010-05-23 ENCOUNTER — Encounter (HOSPITAL_COMMUNITY)
Admission: RE | Admit: 2010-05-23 | Discharge: 2010-05-23 | Disposition: A | Discharge status: Home / Self Care | Payer: Medicare Other | Source: Ambulatory Visit | Attending: Oncology | Admitting: Oncology

## 2010-05-23 DIAGNOSIS — C50919 Malignant neoplasm of unspecified site of unspecified female breast: Secondary | ICD-10-CM

## 2010-05-23 DIAGNOSIS — D649 Anemia, unspecified: Secondary | ICD-10-CM | POA: Insufficient documentation

## 2010-05-23 DIAGNOSIS — Z5112 Encounter for antineoplastic immunotherapy: Secondary | ICD-10-CM

## 2010-05-23 LAB — TYPE & CROSSMATCH - CHCC

## 2010-05-23 LAB — ABO/RH: ABO/RH(D): B POS

## 2010-05-24 LAB — CROSSMATCH
ABO/RH(D): B POS
Antibody Screen: NEGATIVE
Unit division: 0
Unit division: 0

## 2010-05-28 ENCOUNTER — Telehealth: Payer: Self-pay | Admitting: Internal Medicine

## 2010-05-29 NOTE — Assessment & Plan Note (Addendum)
Summary: muga study dx/MEDICARE ONLY, NO PAC RQD/CMH    Muga Study  Indication: Assess LVF 11/11 (L) breast cancer with  bilateral mastectomy with hx. chemotherapy. 1/12 Echo: EF=55-60%. Sx. DOE, fatigue,rapid HR. CRF: FM hx CAD,HTN,Lipids. IV 20G angiocath (R) AC. Patsy Edwards,RN.   Muga Information: The patient's red blood cells were labeled using the Ultra Tag method with 33 mci of Technetium-69m Pertechnetate.  The images were reconstructed in the Anterior, Lateral and Left Anterior Oblique Views.  Impression: LVEF is normal at 73%.    Appended Document: muga study dx/MEDICARE ONLY, NO PAC RQD/CMH Left message to call back   Appended Document: muga study dx/MEDICARE ONLY, NO PAC RQD/CMH Left message to call back   Appended Document: muga study dx/MEDICARE ONLY, NO PAC RQD/CMH Patient aware of results.

## 2010-05-30 ENCOUNTER — Other Ambulatory Visit: Payer: Self-pay | Admitting: Oncology

## 2010-05-30 ENCOUNTER — Encounter: Payer: Self-pay | Admitting: Internal Medicine

## 2010-05-30 ENCOUNTER — Encounter (HOSPITAL_BASED_OUTPATIENT_CLINIC_OR_DEPARTMENT_OTHER): Payer: Medicare Other | Admitting: Oncology

## 2010-05-30 DIAGNOSIS — D649 Anemia, unspecified: Secondary | ICD-10-CM

## 2010-05-30 DIAGNOSIS — C50919 Malignant neoplasm of unspecified site of unspecified female breast: Secondary | ICD-10-CM

## 2010-05-30 DIAGNOSIS — Z5112 Encounter for antineoplastic immunotherapy: Secondary | ICD-10-CM

## 2010-05-30 DIAGNOSIS — Z170 Estrogen receptor positive status [ER+]: Secondary | ICD-10-CM

## 2010-05-30 LAB — COMPREHENSIVE METABOLIC PANEL
ALT: 28 U/L (ref 0–35)
AST: 29 U/L (ref 0–37)
Albumin: 4 g/dL (ref 3.5–5.2)
Alkaline Phosphatase: 58 U/L (ref 39–117)
BUN: 9 mg/dL (ref 6–23)
CO2: 26 mEq/L (ref 19–32)
Calcium: 9.2 mg/dL (ref 8.4–10.5)
Chloride: 107 mEq/L (ref 96–112)
Creatinine, Ser: 1.27 mg/dL — ABNORMAL HIGH (ref 0.40–1.20)
Glucose, Bld: 97 mg/dL (ref 70–99)
Potassium: 3.9 mEq/L (ref 3.5–5.3)
Sodium: 143 mEq/L (ref 135–145)
Total Bilirubin: 0.6 mg/dL (ref 0.3–1.2)
Total Protein: 6.2 g/dL (ref 6.0–8.3)

## 2010-05-30 LAB — CBC WITH DIFFERENTIAL/PLATELET
BASO%: 0.8 % (ref 0.0–2.0)
Basophils Absolute: 0 10*3/uL (ref 0.0–0.1)
EOS%: 0.3 % (ref 0.0–7.0)
Eosinophils Absolute: 0 10*3/uL (ref 0.0–0.5)
HCT: 31.8 % — ABNORMAL LOW (ref 34.8–46.6)
HGB: 10.7 g/dL — ABNORMAL LOW (ref 11.6–15.9)
LYMPH%: 50.7 % — ABNORMAL HIGH (ref 14.0–49.7)
MCH: 27.5 pg (ref 25.1–34.0)
MCHC: 33.6 g/dL (ref 31.5–36.0)
MCV: 81.9 fL (ref 79.5–101.0)
MONO#: 0.3 10*3/uL (ref 0.1–0.9)
MONO%: 10.9 % (ref 0.0–14.0)
NEUT#: 1.1 10*3/uL — ABNORMAL LOW (ref 1.5–6.5)
NEUT%: 37.3 % — ABNORMAL LOW (ref 38.4–76.8)
Platelets: 278 10*3/uL (ref 145–400)
RBC: 3.89 10*6/uL (ref 3.70–5.45)
RDW: 17.7 % — ABNORMAL HIGH (ref 11.2–14.5)
WBC: 2.9 10*3/uL — ABNORMAL LOW (ref 3.9–10.3)
lymph#: 1.5 10*3/uL (ref 0.9–3.3)

## 2010-06-02 LAB — CBC
HCT: 28.7 % — ABNORMAL LOW (ref 36.0–46.0)
HCT: 30.8 % — ABNORMAL LOW (ref 36.0–46.0)
HCT: 34.8 % — ABNORMAL LOW (ref 36.0–46.0)
Hemoglobin: 10.2 g/dL — ABNORMAL LOW (ref 12.0–15.0)
Hemoglobin: 11.5 g/dL — ABNORMAL LOW (ref 12.0–15.0)
Hemoglobin: 9.4 g/dL — ABNORMAL LOW (ref 12.0–15.0)
MCH: 26.9 pg (ref 26.0–34.0)
MCH: 27.2 pg (ref 26.0–34.0)
MCH: 27.3 pg (ref 26.0–34.0)
MCHC: 32.8 g/dL (ref 30.0–36.0)
MCHC: 33 g/dL (ref 30.0–36.0)
MCHC: 33.1 g/dL (ref 30.0–36.0)
MCV: 82 fL (ref 78.0–100.0)
MCV: 82.1 fL (ref 78.0–100.0)
MCV: 82.5 fL (ref 78.0–100.0)
Platelets: 258 10*3/uL (ref 150–400)
Platelets: 282 10*3/uL (ref 150–400)
Platelets: 302 10*3/uL (ref 150–400)
RBC: 3.5 MIL/uL — ABNORMAL LOW (ref 3.87–5.11)
RBC: 3.75 MIL/uL — ABNORMAL LOW (ref 3.87–5.11)
RBC: 4.22 MIL/uL (ref 3.87–5.11)
RDW: 13.1 % (ref 11.5–15.5)
RDW: 13.1 % (ref 11.5–15.5)
RDW: 13.3 % (ref 11.5–15.5)
WBC: 5.9 10*3/uL (ref 4.0–10.5)
WBC: 8.3 10*3/uL (ref 4.0–10.5)
WBC: 9.8 10*3/uL (ref 4.0–10.5)

## 2010-06-02 LAB — IRON AND TIBC
Iron: 46 ug/dL (ref 42–135)
Saturation Ratios: 16 % — ABNORMAL LOW (ref 20–55)
TIBC: 296 ug/dL (ref 250–470)
UIBC: 250 ug/dL

## 2010-06-02 LAB — FOLATE: Folate: 18.4 ng/mL

## 2010-06-02 LAB — COMPREHENSIVE METABOLIC PANEL
ALT: 15 U/L (ref 0–35)
AST: 16 U/L (ref 0–37)
Albumin: 3.3 g/dL — ABNORMAL LOW (ref 3.5–5.2)
Alkaline Phosphatase: 71 U/L (ref 39–117)
BUN: 19 mg/dL (ref 6–23)
CO2: 26 mEq/L (ref 19–32)
Calcium: 9.4 mg/dL (ref 8.4–10.5)
Chloride: 108 mEq/L (ref 96–112)
Creatinine, Ser: 1.47 mg/dL — ABNORMAL HIGH (ref 0.4–1.2)
GFR calc Af Amer: 44 mL/min — ABNORMAL LOW (ref >60)
GFR calc non Af Amer: 36 mL/min — ABNORMAL LOW (ref >60)
Glucose, Bld: 114 mg/dL — ABNORMAL HIGH (ref 70–99)
Potassium: 3.9 mEq/L (ref 3.5–5.1)
Sodium: 141 mEq/L (ref 135–145)
Total Bilirubin: 0.3 mg/dL (ref 0.3–1.2)
Total Protein: 6.8 g/dL (ref 6.0–8.3)

## 2010-06-02 LAB — URINALYSIS, MICROSCOPIC ONLY
Glucose, UA: NEGATIVE mg/dL
Hgb urine dipstick: NEGATIVE
Ketones, ur: NEGATIVE mg/dL
Nitrite: POSITIVE — AB
Protein, ur: NEGATIVE mg/dL
Specific Gravity, Urine: 1.016 (ref 1.005–1.030)
Urobilinogen, UA: 0.2 mg/dL (ref 0.0–1.0)
pH: 5 (ref 5.0–8.0)

## 2010-06-02 LAB — BASIC METABOLIC PANEL
BUN: 20 mg/dL (ref 6–23)
BUN: 23 mg/dL (ref 6–23)
CO2: 23 mEq/L (ref 19–32)
CO2: 23 mEq/L (ref 19–32)
Calcium: 8.7 mg/dL (ref 8.4–10.5)
Calcium: 8.9 mg/dL (ref 8.4–10.5)
Chloride: 105 mEq/L (ref 96–112)
Chloride: 108 mEq/L (ref 96–112)
Creatinine, Ser: 1.68 mg/dL — ABNORMAL HIGH (ref 0.4–1.2)
Creatinine, Ser: 1.8 mg/dL — ABNORMAL HIGH (ref 0.4–1.2)
GFR calc Af Amer: 35 mL/min — ABNORMAL LOW (ref >60)
GFR calc Af Amer: 37 mL/min — ABNORMAL LOW (ref >60)
GFR calc non Af Amer: 29 mL/min — ABNORMAL LOW (ref >60)
GFR calc non Af Amer: 31 mL/min — ABNORMAL LOW (ref >60)
Glucose, Bld: 92 mg/dL (ref 70–99)
Glucose, Bld: 96 mg/dL (ref 70–99)
Potassium: 3.7 mEq/L (ref 3.5–5.1)
Potassium: 3.8 mEq/L (ref 3.5–5.1)
Sodium: 138 mEq/L (ref 135–145)
Sodium: 139 mEq/L (ref 135–145)

## 2010-06-02 LAB — AMYLASE: Amylase: 132 U/L — ABNORMAL HIGH (ref 0–105)

## 2010-06-02 LAB — DIFFERENTIAL
Basophils Absolute: 0 10*3/uL (ref 0.0–0.1)
Basophils Relative: 0 % (ref 0–1)
Eosinophils Absolute: 0.1 10*3/uL (ref 0.0–0.7)
Eosinophils Relative: 1 % (ref 0–5)
Lymphocytes Relative: 25 % (ref 12–46)
Lymphs Abs: 2.4 10*3/uL (ref 0.7–4.0)
Monocytes Absolute: 0.5 10*3/uL (ref 0.1–1.0)
Monocytes Relative: 5 % (ref 3–12)
Neutro Abs: 6.6 10*3/uL (ref 1.7–7.7)
Neutrophils Relative %: 68 % (ref 43–77)

## 2010-06-02 LAB — LIPASE, BLOOD: Lipase: 25 U/L (ref 11–59)

## 2010-06-02 LAB — PHOSPHORUS: Phosphorus: 2.9 mg/dL (ref 2.3–4.6)

## 2010-06-02 LAB — URINE CULTURE
Colony Count: >=100000
Culture  Setup Time: 201112050105

## 2010-06-02 LAB — RETICULOCYTES
RBC.: 4.11 MIL/uL (ref 3.87–5.11)
Retic Count, Absolute: 41.1 10*3/uL (ref 19.0–186.0)
Retic Ct Pct: 1 % (ref 0.4–3.1)

## 2010-06-02 LAB — HEMOCCULT GUIAC POC 1CARD (OFFICE)
Fecal Occult Bld: NEGATIVE
Fecal Occult Bld: NEGATIVE
Fecal Occult Bld: NEGATIVE

## 2010-06-02 LAB — LACTIC ACID, PLASMA: Lactic Acid, Venous: 1.2 mmol/L (ref 0.5–2.2)

## 2010-06-02 LAB — FERRITIN: Ferritin: 64 ng/mL (ref 10–291)

## 2010-06-02 LAB — VITAMIN B12: Vitamin B-12: 669 pg/mL (ref 211–911)

## 2010-06-02 LAB — MAGNESIUM: Magnesium: 2.1 mg/dL (ref 1.5–2.5)

## 2010-06-02 LAB — TSH: TSH: 2.217 u[IU]/mL (ref 0.350–4.500)

## 2010-06-03 LAB — SURGICAL PCR SCREEN
MRSA, PCR: NEGATIVE
Staphylococcus aureus: NEGATIVE

## 2010-06-03 LAB — CBC
HCT: 32.3 % — ABNORMAL LOW (ref 36.0–46.0)
HCT: 35 % — ABNORMAL LOW (ref 36.0–46.0)
Hemoglobin: 10.7 g/dL — ABNORMAL LOW (ref 12.0–15.0)
Hemoglobin: 11.4 g/dL — ABNORMAL LOW (ref 12.0–15.0)
MCH: 27.1 pg (ref 26.0–34.0)
MCH: 27.5 pg (ref 26.0–34.0)
MCHC: 32.6 g/dL (ref 30.0–36.0)
MCHC: 33.1 g/dL (ref 30.0–36.0)
MCV: 83 fL (ref 78.0–100.0)
MCV: 83.3 fL (ref 78.0–100.0)
Platelets: 180 10*3/uL (ref 150–400)
Platelets: 185 10*3/uL (ref 150–400)
RBC: 3.89 MIL/uL (ref 3.87–5.11)
RBC: 4.2 MIL/uL (ref 3.87–5.11)
RDW: 13.3 % (ref 11.5–15.5)
RDW: 13.6 % (ref 11.5–15.5)
WBC: 6.2 10*3/uL (ref 4.0–10.5)
WBC: 8.9 10*3/uL (ref 4.0–10.5)

## 2010-06-03 LAB — BASIC METABOLIC PANEL
BUN: 20 mg/dL (ref 6–23)
CO2: 28 mEq/L (ref 19–32)
Calcium: 9.8 mg/dL (ref 8.4–10.5)
Chloride: 104 mEq/L (ref 96–112)
Creatinine, Ser: 1.59 mg/dL — ABNORMAL HIGH (ref 0.4–1.2)
GFR calc Af Amer: 40 mL/min — ABNORMAL LOW (ref >60)
GFR calc non Af Amer: 33 mL/min — ABNORMAL LOW (ref >60)
Glucose, Bld: 89 mg/dL (ref 70–99)
Potassium: 3.6 mEq/L (ref 3.5–5.1)
Sodium: 142 mEq/L (ref 135–145)

## 2010-06-03 LAB — CANCER ANTIGEN 27.29: CA 27.29: 35 U/mL (ref 0–39)

## 2010-06-03 NOTE — Progress Notes (Signed)
Summary: rtn call to get resutls  Phone Note Call from Patient Call back at [phone]   Caller: Patient Reason for Call: Talk to Nurse, Talk to Doctor, Lab or Test Results Summary of Call: pt rtn call to get resutls Initial call taken by: Shelda Pal,  May 28, 2010 2:33 PM  Follow-up for Phone Call        Called patient back. She is aware of results of Muga Scan and asked that report be sent to [facility].  Francetta Found, RN, BSN  May 28, 2010 3:13 PM

## 2010-06-03 NOTE — Discharge Summary (Signed)
[name], Leach NO.:  [id number]  MEDICAL RECORD NO.:  [id number]          PATIENT TYPE:  OBV  LOCATION:  6712                         FACILITY:  [facility]  PHYSICIAN:  Acquanetta Chain, D.O.DATE OF BIRTH:  02/06/1951  DATE OF ADMISSION:  02/24/2010 DATE OF DISCHARGE:  02/26/2010                              DISCHARGE SUMMARY   DISCHARGE DIAGNOSES: 1. Abdominal pain. 2. Acute kidney injury. 3. Anemia. 4. Hypertension. 5. Asthma. 6. Breast cancer status post bilateral mastectomy. 7. Chronic back pain. 8. Pyuria.  The patient was discharged on: 1. Ciprofloxacin 250 mg 1 tablet p.o. twice a day. 2. Norvasc 10 mg by mouth once a day. 3. Advair 1 inhalation twice a day. 4. Protonix 40 mg by mouth twice a day. 5. Tylenol 650 mg by mouth as needed for pain every 6 hours. 6. Albuterol 2 puffs inhaled as needed. 7. Lorazepam 1 mg by mouth twice a day as needed.  The patient has a followup appointment in the [facility] with Dr. Stanford Scotland on December 15 at 9 a.m.  At this time, the patient will need her BMET rechecked to assess kidney functions, also an outpatient colonoscopy will need to be scheduled to determine; 1. Any source of ileitis. 2. For source of anemia. 3. Screening colonoscopy for family history of colon cancer.  During this hospitalization, the patient had a CT of the abdomen and pelvis with IV contrast and with oral contrast.  The CT scan showed: 1. Prominently inflamed loop of ileum with associated pelvic ascites. 2. A 2-mm left kidney lower pole nonobstructive calculus.  The patient also an acute abdominal series which showed no acute abnormalities.  HISTORY OF PRESENT ILLNESS:  This is a 60 year old female with past medical history of hypertension, breast cancer status post bilateral mastectomy that occurred on November 15, hyperlipidemia, and chronic back pain.  She was presenting with left upper quadrant pain  starting around 2 a.m. the morning of admission.  She said she had taken 2 tablets of Dulcolax about an hour before the pain started.  She had one bowel movement with diarrhea.  She denied any blood in her bowels at that time.  She describes the pain as sharp, intermittent, 10/10 that occurs randomly, lasting a couple of seconds.  The patient says she had only Tramadol one time for the pain the day before hospitalization.  She complaints of nausea but denies any vomiting and she complains of decrease in appetite.  On admission, her vital signs were with a temperature of 98.8, blood pressure 134/72, a pulse of 99, and O2 was 97% on room air.  Her CBC on admission was a white blood cell of 9.8, hemoglobin of 11.5, hematocrit of 34.8, platelet count of 302, and MCV was 82.5.  Her CMP with sodium of 141, potassium of 3.9, chloride of 108, CO2 of 26, BUN of 19, creatinine of 1.47, and glucose of 114.  Her amylase was 132, her lipase was 25, her magnesium was 2.1, and phosphorus was 2.9.  Her total bilirubin was 0.3, alk phos 71, AST of 16, ALT was 15, her total protein  was 6.8, albumin was 3.3, and calcium was 9.4.  Her lactic acid was 1.2.  Urinalysis showed 11-20 white  blood cells, moderate leukocytes, positive for nitrites, many bacteria and few squamous cells.  Her occult blood was negative x3.  Her anemia panel was within normal limits with an iron of 46, total iron binding capacity of 297, percent saturation was 16, UIBC was 250, vitamin B12 was 669, folate was 18.4, and ferritin was 64.  Urine culture grew out over 100,000 colonies of E. coli which was pansensitive.  Her TSH was 2.27.  HOSPITAL COURSE: 1. Abdominal pain.  The morning after admission, her pain had resolved     and was okay in her right upper quadrant epigastrium.  Her pain was     well controlled with Tylenol.  The pain is most likely due to     ileitis that was discovered on the CT scan.  The patient was kept      n.p.o. and her diet was advanced.  She was able to tolerate her     diet upon discharge. 2. Pyuria.  The patient was prescribed on Cipro 250 b.i.d. for 5 days. 3. Acute kidney injury.  This is most likely due to probably     dehydration with contrast-induced nephropathy as well as     hydrochlorothiazide and lisinopril that she is on during her     outpatient visit here before.  The lisinopril and     hydrochlorothiazide were held.  We aggressively hydrated her and     her kidney function improved upon discharge. 4. Anemia.  Her hemoglobin had decreased slightly prior to discharge.     This was most likely due to hemodilution from the fluids that we     were giving her to treat her AKI.  Her anemia panel was negative,     occult blood was negative.  She will have follow up colonoscopy on     an outpatient basis after scheduling on her outpatient followup     visit on December 15. 5. Hypertension.  Continue the Norvasc.  Lisinopril and     hydrochlorothiazide were held due to AKI.  This will be held until     her follow up visit on December 15. 6. Asthma.  Continue her Advair and albuterol.  She did not have any     acute asthmatic events during her admission. 7. Breast cancer.  She is status post bilateral mastectomy.  She told     me that she has a followup visit this Friday with her physician to     discuss further treatments that are needed for her cancer     treatment.  The patient's vital signs on discharge; temperature was 98.1, pulse was 74, respirations 20, blood pressure 105/61 with an O2 of 96%.  Her sodium was 139, potassium 3.7, chloride of 108, CO2 of 23,  BUN of 20, creatinine of 1.68, and glucose of 92.  Her white blood cell was 5.9, hemoglobin 9.4, hematocrit 28.7, and platelets of 258.     Julieta Bellini, MD   ______________________________ Acquanetta Chain, D.O.    CEM/MEDQ  D:  02/26/2010  T:  02/27/2010  Job:  [id number]  cc:   [name] Obey,  MD Julieta Bellini, MD  Electronically Signed by Barton Dubois MD on 06/02/2010 09:14:10 AM Electronically Signed by Lane Hacker D.O. on 06/03/2010 08:56:22 PM

## 2010-06-06 ENCOUNTER — Other Ambulatory Visit: Payer: Self-pay | Admitting: Physician Assistant

## 2010-06-06 ENCOUNTER — Other Ambulatory Visit: Payer: Self-pay | Admitting: Oncology

## 2010-06-06 ENCOUNTER — Encounter (HOSPITAL_BASED_OUTPATIENT_CLINIC_OR_DEPARTMENT_OTHER): Payer: Medicare Other | Admitting: Oncology

## 2010-06-06 DIAGNOSIS — Z5111 Encounter for antineoplastic chemotherapy: Secondary | ICD-10-CM

## 2010-06-06 DIAGNOSIS — Z5112 Encounter for antineoplastic immunotherapy: Secondary | ICD-10-CM

## 2010-06-06 DIAGNOSIS — C50919 Malignant neoplasm of unspecified site of unspecified female breast: Secondary | ICD-10-CM

## 2010-06-06 LAB — CBC WITH DIFFERENTIAL/PLATELET
BASO%: 0.6 % (ref 0.0–2.0)
Basophils Absolute: 0 10*3/uL (ref 0.0–0.1)
EOS%: 1.9 % (ref 0.0–7.0)
Eosinophils Absolute: 0.1 10*3/uL (ref 0.0–0.5)
HCT: 33.5 % — ABNORMAL LOW (ref 34.8–46.6)
HGB: 11.3 g/dL — ABNORMAL LOW (ref 11.6–15.9)
LYMPH%: 46.6 % (ref 14.0–49.7)
MCH: 27.2 pg (ref 25.1–34.0)
MCHC: 33.7 g/dL (ref 31.5–36.0)
MCV: 80.5 fL (ref 79.5–101.0)
MONO#: 0.4 10*3/uL (ref 0.1–0.9)
MONO%: 8.2 % (ref 0.0–14.0)
NEUT#: 2 10*3/uL (ref 1.5–6.5)
NEUT%: 42.7 % (ref 38.4–76.8)
Platelets: 160 10*3/uL (ref 145–400)
RBC: 4.16 10*6/uL (ref 3.70–5.45)
RDW: 17.3 % — ABNORMAL HIGH (ref 11.2–14.5)
WBC: 4.7 10*3/uL (ref 3.9–10.3)
lymph#: 2.2 10*3/uL (ref 0.9–3.3)

## 2010-06-06 LAB — COMPREHENSIVE METABOLIC PANEL
ALT: 38 U/L — ABNORMAL HIGH (ref 0–35)
AST: 33 U/L (ref 0–37)
Albumin: 4.1 g/dL (ref 3.5–5.2)
Alkaline Phosphatase: 68 U/L (ref 39–117)
BUN: 15 mg/dL (ref 6–23)
CO2: 25 mEq/L (ref 19–32)
Calcium: 9.3 mg/dL (ref 8.4–10.5)
Chloride: 106 mEq/L (ref 96–112)
Creatinine, Ser: 1.23 mg/dL — ABNORMAL HIGH (ref 0.40–1.20)
Glucose, Bld: 91 mg/dL (ref 70–99)
Potassium: 3.8 mEq/L (ref 3.5–5.3)
Sodium: 143 mEq/L (ref 135–145)
Total Bilirubin: 0.6 mg/dL (ref 0.3–1.2)
Total Protein: 6.6 g/dL (ref 6.0–8.3)

## 2010-06-06 LAB — BRAIN NATRIURETIC PEPTIDE: Brain Natriuretic Peptide: 11.7 pg/mL (ref 0.0–100.0)

## 2010-06-07 ENCOUNTER — Encounter: Payer: Self-pay | Admitting: Internal Medicine

## 2010-06-13 ENCOUNTER — Other Ambulatory Visit: Payer: Self-pay | Admitting: Physician Assistant

## 2010-06-13 ENCOUNTER — Encounter (HOSPITAL_BASED_OUTPATIENT_CLINIC_OR_DEPARTMENT_OTHER): Payer: Medicare Other | Admitting: Oncology

## 2010-06-13 DIAGNOSIS — Z5111 Encounter for antineoplastic chemotherapy: Secondary | ICD-10-CM

## 2010-06-13 DIAGNOSIS — C50919 Malignant neoplasm of unspecified site of unspecified female breast: Secondary | ICD-10-CM

## 2010-06-13 DIAGNOSIS — Z5112 Encounter for antineoplastic immunotherapy: Secondary | ICD-10-CM

## 2010-06-13 LAB — CBC WITH DIFFERENTIAL/PLATELET
BASO%: 0.2 % (ref 0.0–2.0)
Basophils Absolute: 0 10*3/uL (ref 0.0–0.1)
EOS%: 0 % (ref 0.0–7.0)
Eosinophils Absolute: 0 10*3/uL (ref 0.0–0.5)
HCT: 34.2 % — ABNORMAL LOW (ref 34.8–46.6)
HGB: 11.4 g/dL — ABNORMAL LOW (ref 11.6–15.9)
LYMPH%: 22.3 % (ref 14.0–49.7)
MCH: 26.9 pg (ref 25.1–34.0)
MCHC: 33.3 g/dL (ref 31.5–36.0)
MCV: 80.7 fL (ref 79.5–101.0)
MONO#: 0.2 10*3/uL (ref 0.1–0.9)
MONO%: 2.4 % (ref 0.0–14.0)
NEUT#: 6.2 10*3/uL (ref 1.5–6.5)
NEUT%: 75.1 % (ref 38.4–76.8)
Platelets: 223 10*3/uL (ref 145–400)
RBC: 4.24 10*6/uL (ref 3.70–5.45)
RDW: 17.1 % — ABNORMAL HIGH (ref 11.2–14.5)
WBC: 8.2 10*3/uL (ref 3.9–10.3)
lymph#: 1.8 10*3/uL (ref 0.9–3.3)
nRBC: 0 % (ref 0–0)

## 2010-06-19 ENCOUNTER — Encounter: Payer: Self-pay | Admitting: Internal Medicine

## 2010-06-19 ENCOUNTER — Ambulatory Visit (INDEPENDENT_AMBULATORY_CARE_PROVIDER_SITE_OTHER): Payer: Medicare Other | Admitting: Internal Medicine

## 2010-06-19 VITALS — BP 130/78 | HR 96 | Wt 216.8 lb

## 2010-06-19 DIAGNOSIS — C50919 Malignant neoplasm of unspecified site of unspecified female breast: Secondary | ICD-10-CM

## 2010-06-19 DIAGNOSIS — R609 Edema, unspecified: Secondary | ICD-10-CM

## 2010-06-19 DIAGNOSIS — I10 Essential (primary) hypertension: Secondary | ICD-10-CM

## 2010-06-19 NOTE — Assessment & Plan Note (Signed)
BP mildly elevated. Given mild edema, I wonder if she might benefit from starting low-dose spironolactone (12.5 daily) for help with both. I have left a message with Dr. Humphrey Rolls to make sure this is ok and she won't be at significant added risk for hyperkalemia in the face of her ongoing chemo regimen.

## 2010-06-19 NOTE — Assessment & Plan Note (Signed)
Doing well. No clinical HF. Continue of Herceptin. F/u echo in May.

## 2010-06-19 NOTE — Patient Instructions (Signed)
Your physician has requested that you have an echocardiogram. Echocardiography is a painless test that uses sound waves to create images of your heart. It provides your doctor with information about the size and shape of your heart and how well your heart's chambers and valves are working. This procedure takes approximately one hour. There are no restrictions for this procedure.  Needs in 2 months Your physician recommends that you schedule a follow-up appointment in: 2 month

## 2010-06-19 NOTE — Assessment & Plan Note (Signed)
As per the HTN section.

## 2010-06-19 NOTE — Progress Notes (Signed)
HPI: [name] is a 60 y/o women with h/o obesity, HTN and HL. Referred by Dr. Humphrey Rolls for management of cardiac status during Herceptin therapy.  Found to have L ductal breast CA. ER/PR +. HER 2 neu was equivocal. s/p bilateral mastectomies in 11/11. Lymph nodes negative.  Continues on chemotherapy including Herceptin every 3 weeks. Tolerating well.    Echo April 10, 2010 EF 55-60%. Grade 1 diastolic dusfunction. MUGA 2/12 EF 73%   Was having problems with edema (on amlodipine) and HCTZ restarted. Now edema improved. Also on dexamethasone. BP running 140-150s.No dyspnea, orthopnea or PND. Stays active with daily chores without a problem.      ROS: All systems negative except as listed in HPI, PMH and Problem List.  Past Medical History  Diagnosis Date  . Depression     seasonal melancholia  . Hyperlipidemia   . Hypertension   . Arthritis     DJD, lumbar spondylosis  . Myopathy     not statin related, cause unknown   . Breast cancer   . Back pain     Current Outpatient Prescriptions  Medication Sig Dispense Refill  . albuterol (PROVENTIL,VENTOLIN) 90 MCG/ACT inhaler Inhale 2 puffs into the lungs every 4 (four) hours as needed.        Marland Kitchen amLODipine (NORVASC) 10 MG tablet Take 10 mg by mouth daily.        Marland Kitchen dexamethasone (DECADRON) 4 MG tablet As needed      . Fluticasone-Salmeterol (ADVAIR HFA IN) Inhale 1 puff into the lungs 2 (two) times daily.        . hydrochlorothiazide 25 MG tablet Take 25 mg by mouth daily.        . hydrOXYzine (ATARAX) 25 MG tablet Daily as needed      . lidocaine-prilocaine (EMLA) cream daily      . lisinopril (PRINIVIL,ZESTRIL) 40 MG tablet Take 40 mg by mouth daily.        Marland Kitchen LORazepam (ATIVAN) 1 MG tablet Take 1 mg by mouth daily as needed. For anxiety       . NEXIUM 40 MG capsule Daily      . nystatin (MYCOSTATIN) 100000 UNIT/ML suspension As needed      . pantoprazole (PROTONIX) 40 MG tablet Take 40 mg by mouth daily.        . prochlorperazine  (COMPAZINE) 10 MG tablet As needed      . traMADol (ULTRAM) 50 MG tablet Take 50 mg by mouth every 6 (six) hours as needed.        . ondansetron (ZOFRAN) 8 MG tablet          PHYSICAL EXAM: Filed Vitals:   06/19/10 0941  BP: 130/78  Pulse: 96   General:  Well appearing. No resp difficulty HEENT: normal Neck: supple. JVP flat. Carotids 2+ bilaterally; no bruits. No lymphadenopathy or thryomegaly appreciated. Cor: PMI normal. Regular rate & rhythm. No rubs, gallops or murmurs. S/p B mastectomies Lungs: clear Abdomen: soft, nontender, nondistended. No hepatosplenomegaly. No bruits or masses. Good bowel sounds. Extremities: no cyanosis, clubbing, rash. Tr edema Neuro: alert & orientedx3, cranial nerves grossly intact. Moves all 4 extremities w/o difficulty. Affect pleasant.    ASSESSMENT & PLAN:

## 2010-06-20 ENCOUNTER — Other Ambulatory Visit: Payer: Self-pay | Admitting: Physician Assistant

## 2010-06-20 ENCOUNTER — Encounter (HOSPITAL_BASED_OUTPATIENT_CLINIC_OR_DEPARTMENT_OTHER): Payer: Medicare Other | Admitting: Oncology

## 2010-06-20 DIAGNOSIS — C50919 Malignant neoplasm of unspecified site of unspecified female breast: Secondary | ICD-10-CM

## 2010-06-20 DIAGNOSIS — Z5111 Encounter for antineoplastic chemotherapy: Secondary | ICD-10-CM

## 2010-06-20 DIAGNOSIS — Z5112 Encounter for antineoplastic immunotherapy: Secondary | ICD-10-CM

## 2010-06-20 LAB — CBC WITH DIFFERENTIAL/PLATELET
BASO%: 0.7 % (ref 0.0–2.0)
Basophils Absolute: 0 10*3/uL (ref 0.0–0.1)
EOS%: 0 % (ref 0.0–7.0)
Eosinophils Absolute: 0 10*3/uL (ref 0.0–0.5)
HCT: 30.9 % — ABNORMAL LOW (ref 34.8–46.6)
HGB: 10.6 g/dL — ABNORMAL LOW (ref 11.6–15.9)
LYMPH%: 19.2 % (ref 14.0–49.7)
MCH: 28.3 pg (ref 25.1–34.0)
MCHC: 34.4 g/dL (ref 31.5–36.0)
MCV: 82.3 fL (ref 79.5–101.0)
MONO#: 0.1 10*3/uL (ref 0.1–0.9)
MONO%: 1.7 % (ref 0.0–14.0)
NEUT#: 4.5 10*3/uL (ref 1.5–6.5)
NEUT%: 78.4 % — ABNORMAL HIGH (ref 38.4–76.8)
Platelets: 249 10*3/uL (ref 145–400)
RBC: 3.76 10*6/uL (ref 3.70–5.45)
RDW: 18.7 % — ABNORMAL HIGH (ref 11.2–14.5)
WBC: 5.8 10*3/uL (ref 3.9–10.3)
lymph#: 1.1 10*3/uL (ref 0.9–3.3)

## 2010-06-24 ENCOUNTER — Other Ambulatory Visit: Payer: Self-pay | Admitting: Physician Assistant

## 2010-06-24 ENCOUNTER — Encounter (HOSPITAL_BASED_OUTPATIENT_CLINIC_OR_DEPARTMENT_OTHER): Payer: Medicare Other | Admitting: Oncology

## 2010-06-24 DIAGNOSIS — Z5112 Encounter for antineoplastic immunotherapy: Secondary | ICD-10-CM

## 2010-06-24 DIAGNOSIS — E860 Dehydration: Secondary | ICD-10-CM

## 2010-06-24 DIAGNOSIS — C50919 Malignant neoplasm of unspecified site of unspecified female breast: Secondary | ICD-10-CM

## 2010-06-24 DIAGNOSIS — R197 Diarrhea, unspecified: Secondary | ICD-10-CM

## 2010-06-24 LAB — BASIC METABOLIC PANEL
BUN: 35 mg/dL — ABNORMAL HIGH (ref 6–23)
CO2: 24 mEq/L (ref 19–32)
Calcium: 8.7 mg/dL (ref 8.4–10.5)
Chloride: 103 mEq/L (ref 96–112)
Creatinine, Ser: 1.94 mg/dL — ABNORMAL HIGH (ref 0.40–1.20)
Glucose, Bld: 140 mg/dL — ABNORMAL HIGH (ref 70–99)
Potassium: 4.4 mEq/L (ref 3.5–5.3)
Sodium: 139 mEq/L (ref 135–145)

## 2010-06-24 LAB — CBC WITH DIFFERENTIAL/PLATELET
BASO%: 0.4 % (ref 0.0–2.0)
Basophils Absolute: 0 10*3/uL (ref 0.0–0.1)
EOS%: 0.2 % (ref 0.0–7.0)
Eosinophils Absolute: 0 10*3/uL (ref 0.0–0.5)
HCT: 30.4 % — ABNORMAL LOW (ref 34.8–46.6)
HGB: 10.5 g/dL — ABNORMAL LOW (ref 11.6–15.9)
LYMPH%: 54.8 % — ABNORMAL HIGH (ref 14.0–49.7)
MCH: 28.5 pg (ref 25.1–34.0)
MCHC: 34.5 g/dL (ref 31.5–36.0)
MCV: 82.8 fL (ref 79.5–101.0)
MONO#: 0.3 10*3/uL (ref 0.1–0.9)
MONO%: 6.8 % (ref 0.0–14.0)
NEUT#: 1.6 10*3/uL (ref 1.5–6.5)
NEUT%: 37.8 % — ABNORMAL LOW (ref 38.4–76.8)
Platelets: 234 10*3/uL (ref 145–400)
RBC: 3.68 10*6/uL — ABNORMAL LOW (ref 3.70–5.45)
RDW: 18.2 % — ABNORMAL HIGH (ref 11.2–14.5)
WBC: 4.2 10*3/uL (ref 3.9–10.3)
lymph#: 2.3 10*3/uL (ref 0.9–3.3)

## 2010-06-27 ENCOUNTER — Other Ambulatory Visit: Payer: Self-pay | Admitting: Oncology

## 2010-06-27 ENCOUNTER — Ambulatory Visit (HOSPITAL_COMMUNITY)
Admission: RE | Admit: 2010-06-27 | Discharge: 2010-06-27 | Disposition: A | Discharge status: Home / Self Care | Payer: Medicare Other | Source: Ambulatory Visit | Attending: Oncology | Admitting: Oncology

## 2010-06-27 ENCOUNTER — Inpatient Hospital Stay (HOSPITAL_COMMUNITY)
Admission: AD | Admit: 2010-06-27 | Discharge: 2010-07-01 | DRG: 066 | Disposition: A | Discharge status: Home / Self Care | Payer: Medicare Other | Source: Ambulatory Visit | Attending: Internal Medicine | Admitting: Internal Medicine

## 2010-06-27 ENCOUNTER — Encounter: Payer: Self-pay | Admitting: Internal Medicine

## 2010-06-27 ENCOUNTER — Encounter (HOSPITAL_BASED_OUTPATIENT_CLINIC_OR_DEPARTMENT_OTHER): Payer: Medicare Other | Admitting: Oncology

## 2010-06-27 ENCOUNTER — Inpatient Hospital Stay (HOSPITAL_COMMUNITY): Admission: AD | Admit: 2010-06-27 | Payer: Self-pay | Source: Ambulatory Visit | Admitting: Oncology

## 2010-06-27 DIAGNOSIS — Z9221 Personal history of antineoplastic chemotherapy: Secondary | ICD-10-CM

## 2010-06-27 DIAGNOSIS — Z853 Personal history of malignant neoplasm of breast: Secondary | ICD-10-CM | POA: Insufficient documentation

## 2010-06-27 DIAGNOSIS — R209 Unspecified disturbances of skin sensation: Secondary | ICD-10-CM | POA: Insufficient documentation

## 2010-06-27 DIAGNOSIS — Z9010 Acquired absence of unspecified breast and nipple: Secondary | ICD-10-CM

## 2010-06-27 DIAGNOSIS — D649 Anemia, unspecified: Secondary | ICD-10-CM

## 2010-06-27 DIAGNOSIS — Z7982 Long term (current) use of aspirin: Secondary | ICD-10-CM

## 2010-06-27 DIAGNOSIS — I6350 Cerebral infarction due to unspecified occlusion or stenosis of unspecified cerebral artery: Principal | ICD-10-CM | POA: Diagnosis present

## 2010-06-27 DIAGNOSIS — C50919 Malignant neoplasm of unspecified site of unspecified female breast: Secondary | ICD-10-CM

## 2010-06-27 DIAGNOSIS — I951 Orthostatic hypotension: Secondary | ICD-10-CM | POA: Diagnosis present

## 2010-06-27 DIAGNOSIS — C7931 Secondary malignant neoplasm of brain: Secondary | ICD-10-CM

## 2010-06-27 DIAGNOSIS — D509 Iron deficiency anemia, unspecified: Secondary | ICD-10-CM | POA: Diagnosis present

## 2010-06-27 DIAGNOSIS — J45909 Unspecified asthma, uncomplicated: Secondary | ICD-10-CM | POA: Diagnosis present

## 2010-06-27 DIAGNOSIS — E785 Hyperlipidemia, unspecified: Secondary | ICD-10-CM | POA: Diagnosis present

## 2010-06-27 DIAGNOSIS — Z170 Estrogen receptor positive status [ER+]: Secondary | ICD-10-CM

## 2010-06-27 DIAGNOSIS — Z5112 Encounter for antineoplastic immunotherapy: Secondary | ICD-10-CM

## 2010-06-27 DIAGNOSIS — F329 Major depressive disorder, single episode, unspecified: Secondary | ICD-10-CM | POA: Diagnosis present

## 2010-06-27 DIAGNOSIS — H539 Unspecified visual disturbance: Secondary | ICD-10-CM | POA: Insufficient documentation

## 2010-06-27 DIAGNOSIS — I10 Essential (primary) hypertension: Secondary | ICD-10-CM | POA: Diagnosis present

## 2010-06-27 DIAGNOSIS — E119 Type 2 diabetes mellitus without complications: Secondary | ICD-10-CM | POA: Diagnosis present

## 2010-06-27 DIAGNOSIS — F3289 Other specified depressive episodes: Secondary | ICD-10-CM | POA: Diagnosis present

## 2010-06-27 DIAGNOSIS — Z79899 Other long term (current) drug therapy: Secondary | ICD-10-CM

## 2010-06-27 LAB — CBC WITH DIFFERENTIAL/PLATELET
BASO%: 0.4 % (ref 0.0–2.0)
Basophils Absolute: 0 10*3/uL (ref 0.0–0.1)
EOS%: 0.1 % (ref 0.0–7.0)
Eosinophils Absolute: 0 10*3/uL (ref 0.0–0.5)
HCT: 29.9 % — ABNORMAL LOW (ref 34.8–46.6)
HGB: 10.2 g/dL — ABNORMAL LOW (ref 11.6–15.9)
LYMPH%: 49.5 % (ref 14.0–49.7)
MCH: 28.5 pg (ref 25.1–34.0)
MCHC: 34.2 g/dL (ref 31.5–36.0)
MCV: 83.3 fL (ref 79.5–101.0)
MONO#: 0.4 10*3/uL (ref 0.1–0.9)
MONO%: 9.5 % (ref 0.0–14.0)
NEUT#: 1.7 10*3/uL (ref 1.5–6.5)
NEUT%: 40.5 % (ref 38.4–76.8)
Platelets: 203 10*3/uL (ref 145–400)
RBC: 3.59 10*6/uL — ABNORMAL LOW (ref 3.70–5.45)
RDW: 18.6 % — ABNORMAL HIGH (ref 11.2–14.5)
WBC: 4.3 10*3/uL (ref 3.9–10.3)
lymph#: 2.1 10*3/uL (ref 0.9–3.3)

## 2010-06-27 LAB — COMPREHENSIVE METABOLIC PANEL
ALT: 19 U/L (ref 0–35)
ALT: 23 U/L (ref 0–35)
AST: 17 U/L (ref 0–37)
AST: 20 U/L (ref 0–37)
Albumin: 4.1 g/dL (ref 3.5–5.2)
Albumin: 3.3 g/dL — ABNORMAL LOW (ref 3.5–5.2)
Alkaline Phosphatase: 53 U/L (ref 39–117)
Alkaline Phosphatase: 52 U/L (ref 39–117)
BUN: 38 mg/dL — ABNORMAL HIGH (ref 6–23)
BUN: 25 mg/dL — ABNORMAL HIGH (ref 6–23)
CO2: 26 mEq/L (ref 19–32)
CO2: 25 mEq/L (ref 19–32)
Calcium: 9.9 mg/dL (ref 8.4–10.5)
Calcium: 8.8 mg/dL (ref 8.4–10.5)
Chloride: 105 mEq/L (ref 96–112)
Chloride: 109 mEq/L (ref 96–112)
Creatinine, Ser: 1.6 mg/dL — ABNORMAL HIGH (ref 0.40–1.20)
Creatinine, Ser: 1.62 mg/dL — ABNORMAL HIGH (ref 0.4–1.2)
GFR calc Af Amer: 39 mL/min — ABNORMAL LOW (ref >60)
GFR calc non Af Amer: 32 mL/min — ABNORMAL LOW (ref >60)
Glucose, Bld: 98 mg/dL (ref 70–99)
Glucose, Bld: 99 mg/dL (ref 70–99)
Potassium: 4.5 mEq/L (ref 3.5–5.3)
Potassium: 3.9 mEq/L (ref 3.5–5.1)
Sodium: 140 mEq/L (ref 135–145)
Sodium: 139 mEq/L (ref 135–145)
Total Bilirubin: 0.4 mg/dL (ref 0.3–1.2)
Total Bilirubin: 0.5 mg/dL (ref 0.3–1.2)
Total Protein: 6 g/dL (ref 6.0–8.3)
Total Protein: 5.5 g/dL — ABNORMAL LOW (ref 6.0–8.3)

## 2010-06-27 LAB — CBC
HCT: 26.4 % — ABNORMAL LOW (ref 36.0–46.0)
Hemoglobin: 9.1 g/dL — ABNORMAL LOW (ref 12.0–15.0)
MCH: 27.9 pg (ref 26.0–34.0)
MCHC: 34.5 g/dL (ref 30.0–36.0)
MCV: 81 fL (ref 78.0–100.0)
Platelets: 160 10*3/uL (ref 150–400)
RBC: 3.26 MIL/uL — ABNORMAL LOW (ref 3.87–5.11)
RDW: 17 % — ABNORMAL HIGH (ref 11.5–15.5)
WBC: 3.9 10*3/uL — ABNORMAL LOW (ref 4.0–10.5)

## 2010-06-27 LAB — CARDIAC PANEL(CRET KIN+CKTOT+MB+TROPI)
CK, MB: 1 ng/mL (ref 0.3–4.0)
Relative Index: INVALID (ref 0.0–2.5)
Total CK: 61 U/L (ref 7–177)
Troponin I: 0.01 ng/mL (ref 0.00–0.06)

## 2010-06-27 LAB — PROTIME-INR
INR: 1.04 (ref 0.00–1.49)
Prothrombin Time: 13.8 seconds (ref 11.6–15.2)

## 2010-06-27 LAB — APTT: aPTT: 23 seconds — ABNORMAL LOW (ref 24–37)

## 2010-06-27 NOTE — H&P (Signed)
Hospital Admission Note Date: 06/27/2010  Patient name: [name] Medical record number: [id number] Date of birth: [birth year] Age: 60 y.o. Gender: female PCP: Lester Owings, MD  Medical Service: Internal Medicine Teaching Service  Attending physician:  Dr. Milta Deiters               Pager: Resident (R2/R3):             Dr. Stanford Scotland                Pager: [phone] Resident (R1):  Dr. Guy Sandifer               Pager:[phone]  Chief Complaint: left hand and toes numbness  History of Present Illness: 60 yo woman with PMH of asthma, HTN, breast cancer dx oct 2011 s/p bilateral mastectomy on chemotherapy (followed by Dr. Humphrey Rolls) presents for left forearm and toe numbness that started on 06/25/10.  She also has left eye twitching and right cheek numbness on the day of admission.  Patient was getting chemotherapy today when she mentioned her symptoms to Dr. Humphrey Rolls and was sent for an MRI of brain which shows right periRolandic cortex in the area of face and upper extremity representation.  She denies any SOB, chestpain, weakness, or changes in vision, or other neurological deficits.  Patient reports having migraine headache in the past couple of days in which bright light exacerbates her headache; this is now resolved.  She has a history of migraine headache since the age of 60.   Denies any fever, nausea, vomiting, abdominal pain,  Urinary or bowel incontinence.    Allergies: She states that she has no known drug allergies  Atorvastatin and Rosuvastatin: pt denies, effects of nasuea, aches and pains noted in E-Chart  Past Medical History  Diagnosis Date  . Depression     seasonal melancholia  . Hyperlipidemia   . Hypertension   . Arthritis     DJD, lumbar spondylosis  . Myopathy     not statin related, cause unknown   . Breast cancer   . Back pain   . Edema extremities     Past Surgical History  Procedure Date  . Abdominal hysterectomy     for fibroids   . Laminectomy     C5-6  Dr. Louanne Skye 2005   . Oophorectomy   . Mastectomy     bilateral     Family History  Problem Relation Age of Onset  . Cancer Mother 79    Breast cancer, mother  . Heart disease Mother 8    CAD  . Breast cancer Mother   . Cancer Father 20    colon cancer  . Colon cancer Father   . Coronary artery disease Other     History   Social History  . Marital Status: Single    Spouse Name: N/A    Number of Children: N/A  . Years of Education: N/A   Occupational History  . Not on file.   Social History Main Topics  . Smoking status: Never Smoker   . Smokeless tobacco: Not on file  . Alcohol Use: No  . Drug Use:   . Sexually Active:    Other Topics Concern  . Not on file   Social History Narrative   Single, 2 kidsNever smokedNo alcohol useNo drugsLaid off from [employer] after working there for 14 years.    Meds: Advair Ativan Nexium Albuterol Norvasc Decadron Atarax HCTZ Lisinopril Nystatin mouth wash Zofran Compazine Tramadol Protonix  Review of Systems: Pertinent items are noted in HPI.  All other systems reviewed and negative.  Physical Exam: T 98.4, BP 144/71, P 97, RR 18, 98% on RA General appearance: alert, cooperative, appears stated age and no distress Head: Normocephalic, without obvious abnormality, atraumatic Eyes: conjunctivae/corneas clear. PERRL, EOM's intact. Fundi benign.   Throat: lips, mucosa, and tongue normal; teeth and gums normal Neck: no adenopathy, no carotid bruit, no JVD, supple, symmetrical, trachea midline and thyroid not enlarged, symmetric, no tenderness/mass/nodules Lungs: clear to auscultation bilaterally and normal percussion bilaterally Breasts: s/p bilateral mastectomy.  L subclavian port-a-cath in place.  Site clean, intact without evidence of erythema or abnormal drainage. Heart: systolic murmur: systolic ejection 1/6,  at 2nd left intercostal space RRR.  No G/R Abdomen: soft, non-tender; bowel sounds normal; no  masses,  no organomegaly Extremities: +2 nonpitting edema BL on LE, DP pulses were difficult to palpate 2/2 to edema, no cyanosis Skin: Skin color, texture, turgor normal. No rashes or lesions Neurologic: CN II-XII grossly intact, motor strength 5/5 upper and lower extremities, intact sensation to light touch, negative Babinski sign, normal finger to nose, rapid alternating movement, heel to shin, neg Romberg, normal gait.   Lab results: Pending  Imaging results:  MRI of brain w/o contrast  1.  Signal abnormality in the right periRolandic cortex in the area   of face and upper extremity representation.  The appearance on this   noncontrasted study favors a small acute infarct (no mass effect or   hemorrhage).   Early cortically based metastatic disease simulating infarct is   possible but felt less likely.  Follow-up brain MRI in 6-8 weeks   should help to delineate these possibilities.   2.  Negative noncontrast MRI appearance of the brain otherwise.   Assessment & Plan by Problem:  1. Right Lacunar stroke.  Give her risk factors such as HTN, HLP, and malignancy, we need to do a full stroke work-up.  Neurology has already seen the patient and recommended ASA and resume her BP home meds.   -Admit to telemetry/neuro floor -Stroke protocol including: Carotid dopplers, CEx3, FLP, UDS, HbA1c, Coags, CBC, CMET -Consult PT/OT -Swallow eval- passed bedside swallow eval  -ASA 325mg  po qd  2. Asthma: well controlled, will continue Advair and albuterol  3. HTN: well controlled, Will continue home meds: Lisinopril 40mg  , Norvasc 10mg  , and HCTZ 25mg   4. Iron deficiency anemia with baseline of 10-11.  CBC is pending  VTE prophylaxis: Lovenox 40mg  SQ daily   Eduardo Osier (R2) _________________ Julius Bowels (R1) _________________

## 2010-06-28 DIAGNOSIS — I6350 Cerebral infarction due to unspecified occlusion or stenosis of unspecified cerebral artery: Secondary | ICD-10-CM

## 2010-06-28 LAB — URINE DRUGS OF ABUSE SCREEN W ALC, ROUTINE (REF LAB)
Amphetamine Screen, Ur: NEGATIVE
Barbiturate Quant, Ur: NEGATIVE
Benzodiazepines.: NEGATIVE
Cocaine Metabolites: NEGATIVE
Creatinine,U: 99.6 mg/dL
Ethyl Alcohol: <10 mg/dL (ref <10)
Marijuana Metabolite: NEGATIVE
Methadone: NEGATIVE
Opiate Screen, Urine: NEGATIVE
Phencyclidine (PCP): NEGATIVE
Propoxyphene: NEGATIVE

## 2010-06-28 LAB — HEMOGLOBIN A1C
Hgb A1c MFr Bld: 6.7 % — ABNORMAL HIGH (ref <5.7)
Mean Plasma Glucose: 146 mg/dL — ABNORMAL HIGH (ref <117)

## 2010-06-28 LAB — LIPID PANEL
Cholesterol: 206 mg/dL — ABNORMAL HIGH (ref 0–200)
HDL: 40 mg/dL (ref >39)
LDL Cholesterol: 106 mg/dL — ABNORMAL HIGH (ref 0–99)
Total CHOL/HDL Ratio: 5.2 RATIO
Triglycerides: 302 mg/dL — ABNORMAL HIGH (ref <150)
VLDL: 60 mg/dL — ABNORMAL HIGH (ref 0–40)

## 2010-06-28 LAB — GLUCOSE, CAPILLARY: Glucose-Capillary: 128 mg/dL — ABNORMAL HIGH (ref 70–99)

## 2010-06-28 NOTE — Consult Note (Signed)
[name], [name]             ACCOUNT NO.:  [id number]  MEDICAL RECORD NO.:  [id number]           PATIENT TYPE:  I  LOCATION:  3033                         FACILITY:  [facility]  PHYSICIAN:  Aldean Ast, MD       DATE OF BIRTH:  [birth year]  DATE OF CONSULTATION:  06/27/2010 DATE OF DISCHARGE:                                CONSULTATION   REASON FOR CONSULTATION:  Stroke.  CHIEF COMPLAINT:  Sudden onset of left face and arm tingling and numbness.  HISTORY OF PRESENT ILLNESS:  This patient is a 59 year old woman with multiple comorbidities who was in her Hematology/Oncology Clinic on last Wednesday for her IV fluid placements when she noticed sudden onset of left face and arm tingling and numbness.  At first, the patient did notice much because there was no other associated neurological deficit but the symptoms did not resolve over a span of next 2 days, therefore today when she went down for her Oncology Clinic for the followup of her CA breast related chemotherapy.  She complained them the same problem and an MRI of the brain was performed which showed a small lacunar stroke in the right cortical perirolandic zone which does correlate with her symptom.  The patient otherwise has no neurological deficit and states that even though tingling and numbness are now improving.  PAST MEDICAL HISTORY: 1. CA breast status post bilateral mastectomy, she is getting the     chemotherapy. 2. Hypertension. 3. Anemia. 4. Asthma.  SOCIAL HISTORY:  She is married.  Her children lives with her family. Denies smoking, alcohol, or illicit drug abuse.  FAMILY HISTORY:  There is a colon cancer in the family.  There is no vasculopathy, otherwise in the family.  ALLERGIES:  She is allergic to LIPITOR or PRAVASTATIN which causes her to have nausea, aches, and pains.  CURRENT LIST OF MEDICATIONS: 1. She takes lisinopril 40 mg daily. 2. Amlodipine 10 mg daily. 3. Fluticasone. 4. Solu-Medrol  inhaler 1 inhalation twice a day p.r.n. 5. Albuterol inhaler 2 inhalations every 4 hours as needed. 6. Hydrochlorothiazide 25 mg daily. 7. Protonix 40 mg daily. 8. She also takes multiple medications on p.r.n. basis before and     after her chemotherapy regarding the chemotherapy session.  REVIEW OF SYSTEMS:  The patient denies any chest pain, denies any shortness of breath, denies any nausea, vomiting, or diarrhea, denies any fever, denies any rashes, denies any weight loss, denies any weakness of any extremity, denies any visual changes in relation to this event, although she does have blurry vision for a while now.  Denies any problem with the burning urination or bowel or bladder control function. The rest of 10 organ review of systems have been unremarkable except those mentioned in the HPI.  REVIEW OF WORKUP:  I have reviewed her MRI of the brain and has seen small lacunar size right perirolandic area stroke that does correlate with her symptoms.  There is no recent labs performed on her otherwise.  PHYSICAL EXAMINATION:  VITAL SIGNS:  Blood pressure 128/68 mmHg, pulse of 73 per minute. GENERAL:  Lying comfortably in bed, alert  and oriented x3, in no acute distress. HEENT:  Bilateral pupils reactive to light and accommodation.  Moves eyes to all direction.  No field cut. NEUROLOGIC:  Face symmetrical for strength, however, she does feel somewhat altered to touch on the left side as compared to the normal right side.  Midline tongue.  No atrophy.  No fasciculation.  Shoulder shrug intact.  Motor strength is 5/5 all over and proximal and distal muscle groups of upper and lower extremities.  The sensory examination reveals altered sensation on the left arm as compared to the right arm as well.  Gait was deferred, not tested.  IMPRESSION:  This is a 60 year old woman with lacunar size small stroke in the right perirolandic zone that correlates with her sudden onset of left face  and arm tingling and numbness 2 days ago.  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  I do not see any mass effect for the concern of metastatic lesion in this setting.  Also, the patient has had only stage I CA breast and metastasis is less likely because of that as well.  PLAN: 1. I have already started the patient on aspirin 81 mg on daily basis.     Please keep on that.  First dose is given. 2. Doppler carotids. 3. Cardiac echocardiogram. 4. Please send the fasting lipid panel in the morning labs.  The     patient is allergic to statins, so do not start meds until the     panel comes back, requiring the need of one. 5. Goal blood pressure from now is normotension since she is out of     her first 24 hour of symptom onset.  So, please restart her blood     pressure medications. 6. I have already discussed the details of my impression and the     workup with the family and the patient and they seemed to     understand that.  I will follow up with the patient and the workup     as we proceed.          ______________________________ Aldean Ast, MD     WS/MEDQ  D:  06/27/2010  T:  06/28/2010  Job:  [id number]  Electronically Signed by Aldean Ast MD on 06/28/2010 08:12:46 AM

## 2010-06-29 LAB — BASIC METABOLIC PANEL
BUN: 19 mg/dL (ref 6–23)
CO2: 26 mEq/L (ref 19–32)
Calcium: 9 mg/dL (ref 8.4–10.5)
Chloride: 108 mEq/L (ref 96–112)
Creatinine, Ser: 1.35 mg/dL — ABNORMAL HIGH (ref 0.4–1.2)
GFR calc Af Amer: 48 mL/min — ABNORMAL LOW (ref >60)
GFR calc non Af Amer: 40 mL/min — ABNORMAL LOW (ref >60)
Glucose, Bld: 105 mg/dL — ABNORMAL HIGH (ref 70–99)
Potassium: 4.2 mEq/L (ref 3.5–5.1)
Sodium: 141 mEq/L (ref 135–145)

## 2010-06-29 LAB — POCT I-STAT, CHEM 8
BUN: 26 mg/dL — ABNORMAL HIGH (ref 6–23)
Calcium, Ion: 1.12 mmol/L (ref 1.12–1.32)
Chloride: 107 mEq/L (ref 96–112)
Creatinine, Ser: 1.5 mg/dL — ABNORMAL HIGH (ref 0.4–1.2)
Glucose, Bld: 125 mg/dL — ABNORMAL HIGH (ref 70–99)
HCT: 34 % — ABNORMAL LOW (ref 36.0–46.0)
Hemoglobin: 11.6 g/dL — ABNORMAL LOW (ref 12.0–15.0)
Potassium: 3.6 mEq/L (ref 3.5–5.1)
Sodium: 140 mEq/L (ref 135–145)
TCO2: 24 mmol/L (ref 0–100)

## 2010-06-29 LAB — DIFFERENTIAL
Basophils Absolute: 0 10*3/uL (ref 0.0–0.1)
Basophils Relative: 1 % (ref 0–1)
Eosinophils Absolute: 0.3 10*3/uL (ref 0.0–0.7)
Eosinophils Relative: 5 % (ref 0–5)
Lymphocytes Relative: 56 % — ABNORMAL HIGH (ref 12–46)
Lymphs Abs: 3.5 10*3/uL (ref 0.7–4.0)
Monocytes Absolute: 0.4 10*3/uL (ref 0.1–1.0)
Monocytes Relative: 6 % (ref 3–12)
Neutro Abs: 2.1 10*3/uL (ref 1.7–7.7)
Neutrophils Relative %: 33 % — ABNORMAL LOW (ref 43–77)

## 2010-06-29 LAB — CBC
HCT: 26.1 % — ABNORMAL LOW (ref 36.0–46.0)
HCT: 34.3 % — ABNORMAL LOW (ref 36.0–46.0)
Hemoglobin: 8.8 g/dL — ABNORMAL LOW (ref 12.0–15.0)
Hemoglobin: 11.6 g/dL — ABNORMAL LOW (ref 12.0–15.0)
MCH: 27.5 pg (ref 26.0–34.0)
MCHC: 33.7 g/dL (ref 30.0–36.0)
MCHC: 33.8 g/dL (ref 30.0–36.0)
MCV: 81.6 fL (ref 78.0–100.0)
MCV: 84.1 fL (ref 78.0–100.0)
Platelets: 150 10*3/uL (ref 150–400)
Platelets: 163 10*3/uL (ref 150–400)
RBC: 3.2 MIL/uL — ABNORMAL LOW (ref 3.87–5.11)
RBC: 4.07 MIL/uL (ref 3.87–5.11)
RDW: 17.2 % — ABNORMAL HIGH (ref 11.5–15.5)
RDW: 13.9 % (ref 11.5–15.5)
WBC: 3.3 10*3/uL — ABNORMAL LOW (ref 4.0–10.5)
WBC: 6.3 10*3/uL (ref 4.0–10.5)

## 2010-06-29 LAB — POCT I-STAT 3, VENOUS BLOOD GAS (G3P V)
Acid-Base Excess: 4 mmol/L — ABNORMAL HIGH (ref 0.0–2.0)
Bicarbonate: 26.2 mEq/L — ABNORMAL HIGH (ref 20.0–24.0)
O2 Saturation: 44 %
TCO2: 27 mmol/L (ref 0–100)
pCO2, Ven: 32.3 mmHg — ABNORMAL LOW (ref 45.0–50.0)
pH, Ven: 7.517 — ABNORMAL HIGH (ref 7.250–7.300)
pO2, Ven: 22 mmHg — CL (ref 30.0–45.0)

## 2010-06-29 LAB — POCT CARDIAC MARKERS
CKMB, poc: 1.7 ng/mL (ref 1.0–8.0)
Myoglobin, poc: 151 ng/mL (ref 12–200)
Troponin i, poc: <0.05 ng/mL (ref 0.00–0.09)

## 2010-06-29 LAB — BRAIN NATRIURETIC PEPTIDE: Pro B Natriuretic peptide (BNP): <30 pg/mL (ref 0.0–100.0)

## 2010-06-29 LAB — MAGNESIUM: Magnesium: 1.8 mg/dL (ref 1.5–2.5)

## 2010-06-29 LAB — D-DIMER, QUANTITATIVE: D-Dimer, Quant: 0.61 ug/mL-FEU — ABNORMAL HIGH (ref 0.00–0.48)

## 2010-06-30 ENCOUNTER — Inpatient Hospital Stay (HOSPITAL_COMMUNITY): Payer: Medicare Other

## 2010-06-30 DIAGNOSIS — I6350 Cerebral infarction due to unspecified occlusion or stenosis of unspecified cerebral artery: Secondary | ICD-10-CM

## 2010-06-30 LAB — CBC
HCT: 28 % — ABNORMAL LOW (ref 36.0–46.0)
Hemoglobin: 9.4 g/dL — ABNORMAL LOW (ref 12.0–15.0)
MCH: 27.5 pg (ref 26.0–34.0)
MCHC: 33.6 g/dL (ref 30.0–36.0)
MCV: 81.9 fL (ref 78.0–100.0)
Platelets: 157 10*3/uL (ref 150–400)
RBC: 3.42 MIL/uL — ABNORMAL LOW (ref 3.87–5.11)
RDW: 17.2 % — ABNORMAL HIGH (ref 11.5–15.5)
WBC: 3.7 10*3/uL — ABNORMAL LOW (ref 4.0–10.5)

## 2010-06-30 LAB — BASIC METABOLIC PANEL
BUN: 16 mg/dL (ref 6–23)
CO2: 27 mEq/L (ref 19–32)
Calcium: 9.1 mg/dL (ref 8.4–10.5)
Chloride: 109 mEq/L (ref 96–112)
Creatinine, Ser: 1.33 mg/dL — ABNORMAL HIGH (ref 0.4–1.2)
GFR calc Af Amer: 49 mL/min — ABNORMAL LOW (ref >60)
GFR calc non Af Amer: 41 mL/min — ABNORMAL LOW (ref >60)
Glucose, Bld: 99 mg/dL (ref 70–99)
Potassium: 4.1 mEq/L (ref 3.5–5.1)
Sodium: 141 mEq/L (ref 135–145)

## 2010-06-30 LAB — GLUCOSE, CAPILLARY: Glucose-Capillary: 106 mg/dL — ABNORMAL HIGH (ref 70–99)

## 2010-07-01 LAB — COMPREHENSIVE METABOLIC PANEL
ALT: 18 U/L (ref 0–35)
AST: 23 U/L (ref 0–37)
Albumin: 4 g/dL (ref 3.5–5.2)
Alkaline Phosphatase: 55 U/L (ref 39–117)
BUN: 16 mg/dL (ref 6–23)
CO2: 23 mEq/L (ref 19–32)
Calcium: 9.6 mg/dL (ref 8.4–10.5)
Chloride: 108 mEq/L (ref 96–112)
Creatinine, Ser: 1.29 mg/dL — ABNORMAL HIGH (ref 0.4–1.2)
GFR calc Af Amer: 51 mL/min — ABNORMAL LOW (ref >60)
GFR calc non Af Amer: 42 mL/min — ABNORMAL LOW (ref >60)
Glucose, Bld: 125 mg/dL — ABNORMAL HIGH (ref 70–99)
Potassium: 3.7 mEq/L (ref 3.5–5.1)
Sodium: 142 mEq/L (ref 135–145)
Total Bilirubin: 0.7 mg/dL (ref 0.3–1.2)
Total Protein: 6.8 g/dL (ref 6.0–8.3)

## 2010-07-01 LAB — DIFFERENTIAL
Basophils Absolute: 0 10*3/uL (ref 0.0–0.1)
Basophils Relative: 1 % (ref 0–1)
Eosinophils Absolute: 0.3 10*3/uL (ref 0.0–0.7)
Eosinophils Relative: 5 % (ref 0–5)
Lymphocytes Relative: 31 % (ref 12–46)
Lymphs Abs: 1.8 10*3/uL (ref 0.7–4.0)
Monocytes Absolute: 0.3 10*3/uL (ref 0.1–1.0)
Monocytes Relative: 5 % (ref 3–12)
Neutro Abs: 3.4 10*3/uL (ref 1.7–7.7)
Neutrophils Relative %: 58 % (ref 43–77)

## 2010-07-01 LAB — RETICULOCYTES
RBC.: 4.23 MIL/uL (ref 3.87–5.11)
Retic Count, Absolute: 59.2 10*3/uL (ref 19.0–186.0)
Retic Ct Pct: 1.4 % (ref 0.4–3.1)

## 2010-07-01 LAB — URINALYSIS, ROUTINE W REFLEX MICROSCOPIC
Bilirubin Urine: NEGATIVE
Glucose, UA: NEGATIVE mg/dL
Hgb urine dipstick: NEGATIVE
Ketones, ur: NEGATIVE mg/dL
Nitrite: NEGATIVE
Protein, ur: NEGATIVE mg/dL
Specific Gravity, Urine: 1.014 (ref 1.005–1.030)
Urobilinogen, UA: 0.2 mg/dL (ref 0.0–1.0)
pH: 5.5 (ref 5.0–8.0)

## 2010-07-01 LAB — CBC
HCT: 30.9 % — ABNORMAL LOW (ref 36.0–46.0)
HCT: 33.8 % — ABNORMAL LOW (ref 36.0–46.0)
Hemoglobin: 10.5 g/dL — ABNORMAL LOW (ref 12.0–15.0)
Hemoglobin: 11.7 g/dL — ABNORMAL LOW (ref 12.0–15.0)
MCHC: 34.1 g/dL (ref 30.0–36.0)
MCHC: 34.4 g/dL (ref 30.0–36.0)
MCV: 83.3 fL (ref 78.0–100.0)
MCV: 83.6 fL (ref 78.0–100.0)
Platelets: 114 10*3/uL — ABNORMAL LOW (ref 150–400)
Platelets: 117 10*3/uL — ABNORMAL LOW (ref 150–400)
RBC: 3.69 MIL/uL — ABNORMAL LOW (ref 3.87–5.11)
RBC: 4.07 MIL/uL (ref 3.87–5.11)
RDW: 13.1 % (ref 11.5–15.5)
RDW: 13.2 % (ref 11.5–15.5)
WBC: 5.8 10*3/uL (ref 4.0–10.5)
WBC: 7.3 10*3/uL (ref 4.0–10.5)

## 2010-07-01 LAB — BASIC METABOLIC PANEL
BUN: 14 mg/dL (ref 6–23)
CO2: 28 mEq/L (ref 19–32)
Calcium: 9.1 mg/dL (ref 8.4–10.5)
Chloride: 105 mEq/L (ref 96–112)
Creatinine, Ser: 1.16 mg/dL (ref 0.4–1.2)
GFR calc Af Amer: 58 mL/min — ABNORMAL LOW (ref >60)
GFR calc non Af Amer: 48 mL/min — ABNORMAL LOW (ref >60)
Glucose, Bld: 88 mg/dL (ref 70–99)
Potassium: 3.6 mEq/L (ref 3.5–5.1)
Sodium: 142 mEq/L (ref 135–145)

## 2010-07-01 LAB — LACTIC ACID, PLASMA: Lactic Acid, Venous: 1.8 mmol/L (ref 0.5–2.2)

## 2010-07-01 LAB — FOLATE: Folate: >20 ng/mL

## 2010-07-01 LAB — VITAMIN B12: Vitamin B-12: 1115 pg/mL — ABNORMAL HIGH (ref 211–911)

## 2010-07-01 LAB — SEDIMENTATION RATE: Sed Rate: 24 mm/hr — ABNORMAL HIGH (ref 0–22)

## 2010-07-01 LAB — LIPASE, BLOOD: Lipase: 33 U/L (ref 11–59)

## 2010-07-01 LAB — BRAIN NATRIURETIC PEPTIDE: Pro B Natriuretic peptide (BNP): <30 pg/mL (ref 0.0–100.0)

## 2010-07-01 LAB — FERRITIN: Ferritin: 36 ng/mL (ref 10–291)

## 2010-07-01 LAB — IRON AND TIBC
Iron: 54 ug/dL (ref 42–135)
Saturation Ratios: 16 % — ABNORMAL LOW (ref 20–55)
TIBC: 335 ug/dL (ref 250–470)
UIBC: 281 ug/dL

## 2010-07-02 LAB — GLUCOSE, CAPILLARY
Glucose-Capillary: 98 mg/dL (ref 70–99)
Glucose-Capillary: 98 mg/dL (ref 70–99)

## 2010-07-04 ENCOUNTER — Encounter (HOSPITAL_BASED_OUTPATIENT_CLINIC_OR_DEPARTMENT_OTHER): Payer: Medicare Other | Admitting: Oncology

## 2010-07-04 ENCOUNTER — Other Ambulatory Visit: Payer: Self-pay | Admitting: Physician Assistant

## 2010-07-04 DIAGNOSIS — Z5112 Encounter for antineoplastic immunotherapy: Secondary | ICD-10-CM

## 2010-07-04 DIAGNOSIS — C50919 Malignant neoplasm of unspecified site of unspecified female breast: Secondary | ICD-10-CM

## 2010-07-04 DIAGNOSIS — Z5111 Encounter for antineoplastic chemotherapy: Secondary | ICD-10-CM

## 2010-07-04 LAB — COMPREHENSIVE METABOLIC PANEL
ALT: 44 U/L — ABNORMAL HIGH (ref 0–35)
AST: 40 U/L — ABNORMAL HIGH (ref 0–37)
Albumin: 3.9 g/dL (ref 3.5–5.2)
Alkaline Phosphatase: 59 U/L (ref 39–117)
BUN: 10 mg/dL (ref 6–23)
CO2: 22 mEq/L (ref 19–32)
Calcium: 9.2 mg/dL (ref 8.4–10.5)
Chloride: 109 mEq/L (ref 96–112)
Creatinine, Ser: 1.23 mg/dL — ABNORMAL HIGH (ref 0.40–1.20)
Glucose, Bld: 88 mg/dL (ref 70–99)
Potassium: 4.3 mEq/L (ref 3.5–5.3)
Sodium: 139 mEq/L (ref 135–145)
Total Bilirubin: 0.5 mg/dL (ref 0.3–1.2)
Total Protein: 6 g/dL (ref 6.0–8.3)

## 2010-07-04 LAB — CBC WITH DIFFERENTIAL/PLATELET
BASO%: 1.2 % (ref 0.0–2.0)
Basophils Absolute: 0 10*3/uL (ref 0.0–0.1)
EOS%: 0.4 % (ref 0.0–7.0)
Eosinophils Absolute: 0 10*3/uL (ref 0.0–0.5)
HCT: 27.5 % — ABNORMAL LOW (ref 34.8–46.6)
HGB: 9.4 g/dL — ABNORMAL LOW (ref 11.6–15.9)
LYMPH%: 51.5 % — ABNORMAL HIGH (ref 14.0–49.7)
MCH: 28.8 pg (ref 25.1–34.0)
MCHC: 34.2 g/dL (ref 31.5–36.0)
MCV: 84.1 fL (ref 79.5–101.0)
MONO#: 0.6 10*3/uL (ref 0.1–0.9)
MONO%: 14.5 % — ABNORMAL HIGH (ref 0.0–14.0)
NEUT#: 1.4 10*3/uL — ABNORMAL LOW (ref 1.5–6.5)
NEUT%: 32.4 % — ABNORMAL LOW (ref 38.4–76.8)
Platelets: 156 10*3/uL (ref 145–400)
RBC: 3.27 10*6/uL — ABNORMAL LOW (ref 3.70–5.45)
RDW: 19.8 % — ABNORMAL HIGH (ref 11.2–14.5)
WBC: 4.2 10*3/uL (ref 3.9–10.3)
lymph#: 2.2 10*3/uL (ref 0.9–3.3)

## 2010-07-04 NOTE — Discharge Summary (Signed)
HOSPITAL DISCHARGE SUMMARY  DATE OF ADMISSION:        06/27/2010  DATE OF DISCHARGE:       07/01/2010   BRIEF HOSPITAL SUMMARY: Pt is a 60 y.o. female who presented to [facility] for evaluation of right cheek paresthesia, left forearm, left toe, and right cheek numbness, which was found to be secondary to right perirolandic cortex infarction confirmed on MRI. Patient was started on aspirin therapy. Echo and carotid dopplers were wnl. Dr. Haroldine Laws (follows for cardiac monitoring while on herceptin therapy) and Dr. Humphrey Rolls (oncologist) notified. Patient not started on statin because has had multiple prior intolerances to statin therapy, crestor and lipitor in past (and wasn't controlled on max dose of pravastatin).  Pt newly diagnosed with DMII, with HgA1c. Was not started on Metformin in setting of CKD. Recommended diet modification. Pt was orthostatic throughout beginning of admission, so she was held of her hctz. Will have close follow-up and may need to be resumed as needed.  Studies pending at time of DC: None  Other follow-up issues: 1) CBC should be checked to assess stability of hemoglobin. 2) HTN - pt was stopped on hctz due to volume depletion with decreased po intake, and orthostasis in hospital course. Consider resume if needed. 3) CBG - patient newly diagnosed with DMII. Recommended diet therapy. Check if she is able to follow dietary recommendations. Consider adding sulfonylurea if needed.  Medications on discharge: aspirin 81 MG tablet Take 81 mg by mouth daily.    ferrous sulfate 325 (65 FE) MG tablet Take 325 mg by mouth 2 (two) times daily.    albuterol (PROVENTIL,VENTOLIN) 90 MCG/ACT inhaler Inhale 2 puffs into the lungs every 4 (four) hours as needed.    amLODipine (NORVASC) 10 MG tablet Take 10 mg by mouth daily.    Fluticasone-Salmeterol (ADVAIR HFA IN) Inhale 1 puff into the lungs 2 (two) times daily.    lisinopril (PRINIVIL,ZESTRIL) 40 MG tablet Take 40 mg by mouth daily.      LORazepam (ATIVAN) 1 MG tablet Take 1 mg by mouth daily as needed. For anxiety   NEXIUM 40 MG capsule Daily  traMADol (ULTRAM) 50 MG tablet Take 50 mg by mouth every 6 (six) hours as needed.

## 2010-07-07 NOTE — Discharge Summary (Signed)
[name], [name]             ACCOUNT NO.:  [id number]  MEDICAL RECORD NO.:  [id number]           PATIENT TYPE:  I  LOCATION:  3033                         FACILITY:  [facility]  PHYSICIAN:  C. Milta Deiters, M.D.DATE OF BIRTH:  04/13/1950  DATE OF ADMISSION:  06/27/2010 DATE OF DISCHARGE:  07/01/2010                              DISCHARGE SUMMARY  DISCHARGE DIAGNOSES: 1) Cerebrovascular accident - right perirolandic cortex, confirmed on MRI. 2) Invasive Ductal Carcinoma of Left Breast, status-post bilateral mastectomy and with ongoing chemotherapy, on Friday schedule - managed by Dr. Humphrey Rolls, with cardiac monitoring by Dr. Haroldine Laws in setting of herceptin therapy. 3) History of hypertension, with hypotension experienced in hospital course.  4) Diabetes mellitus, type 2. Newly diagnosed with HgA1c 6.7. 5) Chronic anemia, BL Hgb 9-11. Anemia panel (02/2010) showing B12, folate wnl, ferritin 64. 6) Asthma 7) Depression 8) Hyperlipidemia  DISCHARGE MEDICATIONS:   1) Ferrous Sulfate 325 MG Tab BID 2) ASPIRIN 81 MG  daily 3) NORVASC (AMLODIPINE) 10 MG  daily 4) LISINOPRIL 40 MG daily 5) TRAMADOL 50 MG q6h PRN pain 6) ALBUTEROL INHALER 2 puffs q4 hours PRN shortness of breath or wheezing 7) ADVAIR DISKUS (FLUTICASONE/SALMETEROL) 100/50 inhale 1 puff BID 8) LORAZEPAM 1 MG daily  DISPOSITION AND FOLLOWUP:   Patient is to followup at the [facility] on July 17, 2010, at 11:15 a.m. At that time, CBC should be checked to assess stability of hemoglobin. Control of hypertension should be reassessed as pt is descalated of her hypertensive regimen during hospital course secondary to volume depletion and orthostatic hypotension  experienced during hospital course (dc'ed HCTZ at time of discharge). CBG should be checked to assess blood sugar control, as pt was newly diagnosed with diabetes mellitus and she was recommended dietary changes at the time of discharge,  please reassess  progress towards that goal. Patient also to follow-up with her oncologist, Dr. Humphrey Rolls per her next regularly scheduled visit, and she will require repeat MRI head in 6-8 weeks to check for possible metastatic disease from her breast cancer. Lastly, she is  to continue regular follow-up with Dr. Haroldine Laws for caridac monitoring in the setting of herceptin therapy.  PROCEDURES PERFORMED:   1) MRI brain - June 27, 2010 - Signal abnormality in the right periRolandic cortex in the area of face and upper extremity representation.  The appearance on this noncontrasted study favors a small acute infarct (no mass effect or hemorrhage). Early  cortically based metastatic disease simulating infarct is possible but felt less likely.  Follow-up brain MRI in 6-8 weeks should help to delineate these possibilities. 2) 2-D echo - April 9, 0000000 - Systolic function was normal. The estimated ejection fraction was in the range of 60% to 65%.  3) MRA of head - July 01, 2010 - normal showed both vertebral arteries that are widely patent, basilar artery widely patent, plica superior cerebellar and posterior cerebellar arteries are patent.   4) Carotid ultrasound - June 30, 2010 - No significant extracranial carotid artery stenosis demonstrated.   Vertebrals are patent with antegrade flow.  CONSULTATIONS:  Neurology, [facility]  [facility], Dr. Doneta Public.  ADMISSION HISTORY AND PHYSICAL:  [name] is a 60 year old woman with past medical history of asthma, hypertension, breast cancer diagnosed in 2011, status post bilateral mastectomy on chemotherapy is followed by Dr. Humphrey Rolls who presented for left forearm and toe numbness that started on June 25, 2010.  She also have left eye twitching and right cheek numbness on the day of admission.  The patient was getting chemotherapy during the day of admission when she mentioned her symptoms to Dr. Humphrey Rolls who was sent for an MRI of the brain which showed  right perirolandic cortex stroke in the area of face and upper extremity representation.  She denies shortness of breath, chest pain, weakness, or changes in vision or other neurologic deficits.  The patient reported having had migraine headache in the past couple of days in which bright light exacerbates her headache, this is now resolved.  She had a history of migraine headaches since the age of 52.  She denies fever, nausea, vomiting, abdominal pain, urinary or bowel incontinence.  PHYSICAL EXAMINATION:   VITAL SIGNS:  Temperature 98.4, blood pressure 144/71, pulse 97, respiratory rate 18, satting 98% on room air. GENERAL APPEARANCE:  The patient is alert, cooperative, appears stated age in no acute distress. HEENT:  Head is normocephalic without obvious abnormality, is atraumatic.  Conjunctiva corneas are clear.  Pupils are equal, round, and reactive to light.  Extraocular movements intact.  Fundi were benign.  Throat, lips, mucosa, and tongue are normal.   Teeth and gums are normal. NECK:  There is no adenopathy.  No carotid bruits.  No JVD.  Neck is supple, symmetrical.  Trachea is midline and thyroid nonenlarged.  It is symmetric.  No tenderness, mass, or nodule. LUNGS:  Clear to auscultation bilaterally and normal percussion bilaterally. BREAST:  The patient status post bilateral mastectomy, left subclavian Port-A-Cath in place.  Site is clean, intact without evidence of erythema or abnormal drainage. HEART:  Systolic murmur, systolic ejection 1/6 at second left intercostal space.  Regular rate and rhythm.  No rubs or gallops. ABDOMEN:  Soft, nontender, bowel sounds normal.  No masses.  No organomegaly. EXTREMITIES:  A 2+ nonpitting edema bilateral lower extremities. Dorsalis pedis pulses were difficult to palpate secondary to edema.  No cyanosis. SKIN:  Skin color texture and turgor are normal.  No rashes or lesion. NEUROLOGIC:  Cranial nerves II-XII grossly intact.  Motor strength 5/5  in upper and lower extremities.  Intact sensation to light touch. Negative Babinski sign.  Normal finger-to-nose.  Rapid alternating movement heel to shin.  Negative Romberg and  normal gait.  LABORATORY DATA:   1) BMP: sodium 139, potassium 3.9, chloride 109, bicarb 25, BUN 25, creatinine 1.62, glucose 99.   2) CBC: white blood cell is 3.9, hemoglobin 9.1, hematocrit 26.4, platelets 160.   3) LFTs: T-bili 0.5, alk phos 52, AST 20, ALT 23, total protein 5.5, albumin 3.3, calcium 8.8 4) Coagulation studies: PT 13.8, INR 1.04, PTT 23. 5) Cardiac enzymes CK-MB is 1, troponin 0.01.    HOSPITAL COURSE BY PROBLEM: 1. CVA - involiving right perirolandic cortex, confirmed on MRI head on admission.  - As initial MRI head confirmed infarction, stroke team was involved on admission, with evaluation of risk factors. Pt presented for evaluation beyond the window for tPA, and it was therefore not utilized. Pt was started on aspirin therapy at 81mg  daily  as recommended by neurology. Stroke workup involved carotid dopplers and echocardiogram, which were both unremarkable.  In the setting of her breast cancer, there was intial concern that neurologic findings may be secondary to embolic event. However, we  spoke with radiologist who indicated that distribution and size of infarction were not indicative of embolic source. Dr. Marcia Brash was also notified and agreed that TEE was not necessitated during hospital course. FLP revealed mildly elevated LDL of  106, however, pt was not started on statin therapy as she has multiple prior intolerances to different statins including GI upset and myalgias with both crestor and lipitor. Therefore, pt was recommended aggressive life-style modification. Pt was  continued on aspirin therapy on admission and will also be discharged on this medication. PT/OT evaluation did not indicate need for home health therapy. Patient had resolution of presenting symptoms by time of  discharge.  2. Invasive intraductal carcinoma of left breast - status-post bilateral hysterectomy and current with ongoing chemotherapy. Managed by Dr. Humphrey Rolls, oncology, with cardiac monitoring by Dr. Haroldine Laws in setting of Herceptin therapy. The patient will need a  follow with MRI in 6-8 weeks to evaluate for metastatic disease.  3. History of Hypertension, with orhtostatic hypotension during admission - likely in setting of volume depletion due to decreased oral intake since starting her chemotherapy due to altered taste sensations.  - Patient held of her HCTZ during hospital course, and gently volume repleted, with improvement of volume status. Will have close outpatient follow-up for escalation of therapy if indicated.  4. Diabetes mellitus type 2, newly diagnosed. A1c of 6.7% . - Patient was educated of her new diagnosis, and is recommended diet control and lifestyle modification. As pt with baseline creatinine 1.6, she will not be discharged on Metformin. Will be closely monitored as an outpatient.   5. Chronic anemia.  The patient with chronic baseline anemia hemoglobin 9-11. Stable during hospital course without evidence of active bleed. Last colonoscopy was 2004, which was normal.  Continue on iron supplementation.   6. Asthma - stable during hospital course, continued home medications.   7. Depression - stable during hospital course, continued home medications.   8. HLD - patient was not started on statin secondary to multiple prior intolerances to statin. Recommended aggressive lifestyle modification of diet and exercise as tolerated.    DISCHARGE LABS AND VITALS:   HR: 78, respirations 20 times per minute, blood pressure 127/76, satting 98% on room air.    The patient did not have any labs done on day of discharge.    ______________________________ Chilton Greathouse, DO   ______________________________ C. Milta Deiters, M.D.    SK/MEDQ  D:  07/01/2010  T:   07/02/2010  Job:  [id number]  Electronically Signed by Chilton Greathouse DO on 07/04/2010 11:36:56 PM Electronically Signed by Genelle Gather M.D. on 07/07/2010 08:36:45 AM

## 2010-07-11 ENCOUNTER — Encounter (HOSPITAL_BASED_OUTPATIENT_CLINIC_OR_DEPARTMENT_OTHER): Payer: Medicare Other | Admitting: Oncology

## 2010-07-11 ENCOUNTER — Other Ambulatory Visit: Payer: Self-pay | Admitting: Physician Assistant

## 2010-07-11 DIAGNOSIS — C50919 Malignant neoplasm of unspecified site of unspecified female breast: Secondary | ICD-10-CM

## 2010-07-11 DIAGNOSIS — Z170 Estrogen receptor positive status [ER+]: Secondary | ICD-10-CM

## 2010-07-11 DIAGNOSIS — Z5112 Encounter for antineoplastic immunotherapy: Secondary | ICD-10-CM

## 2010-07-11 DIAGNOSIS — Z8673 Personal history of transient ischemic attack (TIA), and cerebral infarction without residual deficits: Secondary | ICD-10-CM

## 2010-07-11 LAB — CBC WITH DIFFERENTIAL/PLATELET
BASO%: 0.3 % (ref 0.0–2.0)
Basophils Absolute: 0 10*3/uL (ref 0.0–0.1)
EOS%: 1.8 % (ref 0.0–7.0)
Eosinophils Absolute: 0.1 10*3/uL (ref 0.0–0.5)
HCT: 27.8 % — ABNORMAL LOW (ref 34.8–46.6)
HGB: 9.4 g/dL — ABNORMAL LOW (ref 11.6–15.9)
LYMPH%: 41.7 % (ref 14.0–49.7)
MCH: 29.2 pg (ref 25.1–34.0)
MCHC: 33.8 g/dL (ref 31.5–36.0)
MCV: 86.2 fL (ref 79.5–101.0)
MONO#: 0.4 10*3/uL (ref 0.1–0.9)
MONO%: 7.9 % (ref 0.0–14.0)
NEUT#: 2.4 10*3/uL (ref 1.5–6.5)
NEUT%: 48.3 % (ref 38.4–76.8)
Platelets: 156 10*3/uL (ref 145–400)
RBC: 3.22 10*6/uL — ABNORMAL LOW (ref 3.70–5.45)
RDW: 20.6 % — ABNORMAL HIGH (ref 11.2–14.5)
WBC: 5 10*3/uL (ref 3.9–10.3)
lymph#: 2.1 10*3/uL (ref 0.9–3.3)

## 2010-07-17 ENCOUNTER — Ambulatory Visit (INDEPENDENT_AMBULATORY_CARE_PROVIDER_SITE_OTHER): Payer: Medicare Other | Admitting: Internal Medicine

## 2010-07-17 ENCOUNTER — Encounter: Payer: Self-pay | Admitting: Internal Medicine

## 2010-07-17 DIAGNOSIS — D649 Anemia, unspecified: Secondary | ICD-10-CM

## 2010-07-17 DIAGNOSIS — L0292 Furuncle, unspecified: Secondary | ICD-10-CM

## 2010-07-17 DIAGNOSIS — E119 Type 2 diabetes mellitus without complications: Secondary | ICD-10-CM | POA: Insufficient documentation

## 2010-07-17 DIAGNOSIS — I10 Essential (primary) hypertension: Secondary | ICD-10-CM

## 2010-07-17 DIAGNOSIS — R609 Edema, unspecified: Secondary | ICD-10-CM

## 2010-07-17 DIAGNOSIS — L0293 Carbuncle, unspecified: Secondary | ICD-10-CM

## 2010-07-17 LAB — GLUCOSE, CAPILLARY: Glucose-Capillary: 90 mg/dL (ref 70–99)

## 2010-07-17 LAB — CBC
HCT: 31 % — ABNORMAL LOW (ref 36.0–46.0)
Hemoglobin: 10.1 g/dL — ABNORMAL LOW (ref 12.0–15.0)
MCH: 29.2 pg (ref 26.0–34.0)
MCHC: 32.6 g/dL (ref 30.0–36.0)
MCV: 89.6 fL (ref 78.0–100.0)
Platelets: 177 10*3/uL (ref 150–400)
RBC: 3.46 MIL/uL — ABNORMAL LOW (ref 3.87–5.11)
RDW: 19.4 % — ABNORMAL HIGH (ref 11.5–15.5)
WBC: 4.7 10*3/uL (ref 4.0–10.5)

## 2010-07-17 MED ORDER — DOXYCYCLINE HYCLATE 100 MG PO TABS: 100.0000 mg | ORAL_TABLET | Freq: Two times a day (BID) | ORAL | Status: AC

## 2010-07-17 MED ORDER — HYDROCHLOROTHIAZIDE 25 MG PO TABS: 25.0000 mg | ORAL_TABLET | Freq: Two times a day (BID) | ORAL | Status: DC

## 2010-07-17 MED ORDER — TRAMADOL HCL 50 MG PO TABS: 50.0000 mg | ORAL_TABLET | Freq: Four times a day (QID) | ORAL | Status: DC | PRN

## 2010-07-17 NOTE — Patient Instructions (Signed)
Abscess/Boil (Furuncle) An abscess (boil or furuncle) is an infected area that contains a collection of pus.  SYMPTOMS Signs and symptoms of an abscess include pain, tenderness, redness, or hardness. You may feel a moveable soft area under your skin. An abscess can occur anywhere in the body.  TREATMENT An incision (cut by the caregiver) may have been made over your abscess so the pus could be drained out. Gauze may have been packed into the space or a drain may have been looped thru the abscess cavity (pocket). This provides a drain that will allow the cavity to heal from the inside outwards. The abscess may be painful for a few days, but should feel much better if it was drained. Your abscess, if seen early, may not have localized and may not have been drained. If not, another appointment may be required if it does not get better on its own or with medications. HOME CARE INSTRUCTIONS  Only take over-the-counter or prescription medicines for pain, discomfort, or fever as directed by your caregiver.   Keep the skin and clothes clean around your abscess.   If the abscess was drained, you will need to use gauze dressing ("4x4") to collect any draining pus. These dressing typically will need to be changed 3 or more times during the day.   The infection may spread by skin contact with others. Avoid skin contact as much as possible.   Good hygiene is very important including regular hand washing, cover any draining skin lesions, and don't share personal care items.   If you participate in sports do not share athletic equipment, towels, whirlpools, or personal care items. Shower after every practice or tournament.   If a draining area cannot be adequately covered:   Do not participate in sports   Children should not participate in day care until the wound has healed or drainage stops.   If your caregiver has given you a follow-up appointment, it is very important to keep that appointment. Not  keeping the appointment could result in a much worse infection, chronic or permanent injury, pain, and disability. If there is any problem keeping the appointment, you must call back to this facility for assistance.  SEEK MEDICAL CARE IF:  You develop increased pain, swelling, redness, drainage, or bleeding in the wound site.   You develop signs of generalized infection including muscle aches, chills, fever, or a general ill feeling.   You or your child has an oral temperature above 101.   Your baby is older than 3 months with a rectal temperature of 100.5 F (38.1 C) or higher for more than 1 day.  See your caregiver as directed for a recheck or sooner if you develop any of the symptoms described above. Take antibiotics (medicine that kills germs) as directed if they were prescribed. MAKE SURE YOU:   Understand these instructions.   Will watch your condition.   Will get help right away if you are not doing well or get worse.  Document Released: 12/17/2004 Document Re-Released: 08/27/2009 [facility] Patient Information 2011 [place].  Take your meds as prescribed and follow up as needed.

## 2010-07-17 NOTE — Assessment & Plan Note (Addendum)
Patient's blood pressure is 120/79 today. Though patient does not complain of any dizziness I think this is very strict control given that she has had history of dizziness. I would discontinue patient's Norvasc today. Given that patient is also complaining of increased swelling in her legs I would increase hydrochlorothiazide to 25 mg twice daily. The patient has diastolic dysfunction. I would be more lenient with the hypertensive goal in this patient who has had breast cancer with possible metastasis to her brain and currently under chemotherapy.

## 2010-07-17 NOTE — Assessment & Plan Note (Signed)
Patient has bilateral lower extremity edema which is been getting worse recently. Patient is on her legs throughout the day which is likely contributing to decreased venous return. Patient's most recent 2-D echo showed ejection fraction of 60-65% and she has mild renal insufficiency. Her pedal edema is most likely secondary to diastolic dysfunction with likely contribution from decreased venous return and obesity. I have advised the patient to keep her legs above her heart level whenever she can and also put a pillow beneath her legs while sleeping at night to improve venous return and minimize pedal pooling. I have also advised him wearing compression stockings if above measures fail. I would also increase hydrochlorothiazide from 25 mg once a day to 25 mg twice daily.

## 2010-07-17 NOTE — Progress Notes (Signed)
  Subjective:    Patient ID: [name], female    DOB: 03/19/1951, 60 y.o.   MRN: [id number]  HPI Patient is a 60 year old female with past medical history as mentioned in the chart. Patient comes in today for hospital followup. Patient was admitted for a stroke without any residual deficits. Patient also had swelling in both her legs which has been going on since last many months.  The issues at hospital discharge for which the discharging resident wants me to address are: Anemia, I would get patient's CBC as advised. Patient is currently taking one tablet of 325 ferrous sulfate 3 times a day. I encouraged her to continue doing that. Patient's blood pressure is 120/73 today. Patient was dizzy during the hospitalization and one of the discharging instructions were to stop antihypertensives. Patient was put back on her antihypertensives by her cancer doctor. Patient denies any dizziness today.  Patient was diagnosed with a new diabetes with a HbA1c of 6.7 during hospitalization. I would get patient's HbA1c done again today 2 increase the specificity of the result if it still more than 6.5. I would not start her on diabetic medications today.  Patient complaints of a bump in the right lip or vagina. Patient's cancer doctor wanted her primary care physician to take care of the infection.   Review of Systems  Constitutional: Negative for fever, activity change and appetite change.  HENT: Negative for sore throat.   Respiratory: Negative for cough and shortness of breath.   Cardiovascular: Negative for chest pain and leg swelling.  Gastrointestinal: Negative for nausea, abdominal pain, diarrhea, constipation and abdominal distention.  Genitourinary: Negative for frequency, hematuria and difficulty urinating.  Neurological: Negative for dizziness and headaches.  Psychiatric/Behavioral: Negative for suicidal ideas and behavioral problems.       Objective:   Physical Exam  Constitutional:  She is oriented to person, place, and time. She appears well-developed and well-nourished.  HENT:  Head: Normocephalic and atraumatic.  Eyes: Conjunctivae and EOM are normal. Pupils are equal, round, and reactive to light. No scleral icterus.  Neck: Normal range of motion. Neck supple. No JVD present. No thyromegaly present.  Cardiovascular: Normal rate, regular rhythm, normal heart sounds and intact distal pulses.  Exam reveals no gallop and no friction rub.   No murmur heard. Pulmonary/Chest: Effort normal and breath sounds normal. No respiratory distress. She has no wheezes. She has no rales.  Abdominal: Soft. Bowel sounds are normal. She exhibits no distension and no mass. There is no tenderness. There is no rebound and no guarding.  Musculoskeletal: Normal range of motion. She exhibits no edema and no tenderness.  Lymphadenopathy:    She has no cervical adenopathy.  Neurological: She is alert and oriented to person, place, and time.  Psychiatric: She has a normal mood and affect. Her behavior is normal.          Assessment & Plan:

## 2010-07-18 ENCOUNTER — Encounter: Payer: Medicare Other | Admitting: Oncology

## 2010-07-18 ENCOUNTER — Other Ambulatory Visit: Payer: Self-pay | Admitting: Physician Assistant

## 2010-07-18 ENCOUNTER — Encounter (HOSPITAL_BASED_OUTPATIENT_CLINIC_OR_DEPARTMENT_OTHER): Payer: Medicare Other | Admitting: Oncology

## 2010-07-18 DIAGNOSIS — Z170 Estrogen receptor positive status [ER+]: Secondary | ICD-10-CM

## 2010-07-18 DIAGNOSIS — Z5112 Encounter for antineoplastic immunotherapy: Secondary | ICD-10-CM

## 2010-07-18 DIAGNOSIS — C50919 Malignant neoplasm of unspecified site of unspecified female breast: Secondary | ICD-10-CM

## 2010-07-18 LAB — CBC WITH DIFFERENTIAL/PLATELET
BASO%: 0.6 % (ref 0.0–2.0)
Basophils Absolute: 0 10*3/uL (ref 0.0–0.1)
EOS%: 4.3 % (ref 0.0–7.0)
Eosinophils Absolute: 0.2 10*3/uL (ref 0.0–0.5)
HCT: 28.8 % — ABNORMAL LOW (ref 34.8–46.6)
HGB: 9.9 g/dL — ABNORMAL LOW (ref 11.6–15.9)
LYMPH%: 48.2 % (ref 14.0–49.7)
MCH: 30.4 pg (ref 25.1–34.0)
MCHC: 34.5 g/dL (ref 31.5–36.0)
MCV: 88 fL (ref 79.5–101.0)
MONO#: 0.4 10*3/uL (ref 0.1–0.9)
MONO%: 8.9 % (ref 0.0–14.0)
NEUT#: 1.8 10*3/uL (ref 1.5–6.5)
NEUT%: 38 % — ABNORMAL LOW (ref 38.4–76.8)
Platelets: 222 10*3/uL (ref 145–400)
RBC: 3.28 10*6/uL — ABNORMAL LOW (ref 3.70–5.45)
RDW: 20.2 % — ABNORMAL HIGH (ref 11.2–14.5)
WBC: 4.9 10*3/uL (ref 3.9–10.3)
lymph#: 2.3 10*3/uL (ref 0.9–3.3)

## 2010-07-18 LAB — BASIC METABOLIC PANEL WITH GFR
BUN: 22 mg/dL (ref 6–23)
CO2: 25 mEq/L (ref 19–32)
Calcium: 9.3 mg/dL (ref 8.4–10.5)
Chloride: 106 mEq/L (ref 96–112)
Creat: 1.56 mg/dL — ABNORMAL HIGH (ref 0.40–1.20)
GFR, Est African American: 41 mL/min — ABNORMAL LOW (ref >60)
GFR, Est Non African American: 34 mL/min — ABNORMAL LOW (ref >60)
Glucose, Bld: 79 mg/dL (ref 70–99)
Potassium: 4 mEq/L (ref 3.5–5.3)
Sodium: 142 mEq/L (ref 135–145)

## 2010-08-05 NOTE — Discharge Summary (Signed)
[name], TRESSEL NO.:  [id number]   MEDICAL RECORD NO.:  [id number]          PATIENT TYPE:  INP   LOCATION:  5502                         FACILITY:  [facility]   PHYSICIAN:  Thomes Lolling, M.D.    DATE OF BIRTH:  December 30, 1950   DATE OF ADMISSION:  07/26/2008  DATE OF DISCHARGE:  07/27/2008                               DISCHARGE SUMMARY   ATTENDING PHYSICIAN:  Thomes Lolling, M.D.   DISCHARGE DIAGNOSES:  1. Acute on chronic lower back pain, most likely secondary to      musculoskeletal, no signs of sciatica, radiculopathy or equina, CT      positive for mild disk bulging at L3-4 and L4-5, to continue      conservative management with pain control, to follow up as an      outpatient.  2. Hypertension well-controlled.  3. Hyperlipidemia.  4. Depression.  5. History of diastolic dysfunction.  6. Bronchial asthma (cough variant).  7. History of total abdominal hysterectomy plus bilateral salpingo-      oophorectomy in 30s (secondary to fibroids).  8. Family history of breast cancer.  9. Family history of colon cancer.  10.Negative colonoscopy in 2002.  11.Negative mammogram 2 years ago.  12.Chronic anemia with hemoglobins ranging from 11 to 12, secondary to      iron deficiency with a serum ferritin level of 36, to follow up      with outpatient clinic.  13.Thrombocytopenia, chronic, with platelet count on discharge 114, to      follow up with outpatient clinic, unclear etiology.   DISCHARGE MEDICATIONS:  1. Norvasc 10 mg 1 pill once a day.  2. Lisinopril 40 mg 1 pill once a day.  3. Tramadol 50 mg 1 pill every 4 hours as needed for pain.  4. Hydrochlorothiazide 25 mg 1 pill once a day.  5. Albuterol 90 mcg 2 puffs every 4 hours as needed for shortness of      breath.  6. Advair Diskus 500/50 mcg 1 puff twice daily.  7. Flonase 50 mcg 2 sprays in each nostril once daily as needed.  8. Pravachol 80 mg 1 pill once a day.   DISPOSITION AND FOLLOWUP:  The  patient is to follow up with her primary  care physician, Dr. Volanda Napoleon, on June 9 at 1:30 p.m.  At the time of  hospital followup please address the patient's back pain.  During the  hospital admission given her CT findings of mild disk bulge at L3-4 and  L4-5, we did not feel after discussing with Dr. Clifton James, we thought there  was no need for MRI or an orthopedic referral.  If the patient's pain  continues to worsen outpatient physical therapy referral or adding  Cymbalta for chronic pain syndrome would be beneficial.  Also might  consider increasing the tramadol to 100 mg.   PROCEDURES PERFORMED:  1. Chest x-ray Jul 26, 2008.  Impression, no acute cardiopulmonary      process, stable nodular density in the right lower lobe.  No      further followup is necessary.  Degenerative changes of the  thoracic spine.  2. CT of abdomen and pelvis with contrast.  Impression, no acute      abdominal process.  Stable small hepatic hypodensities.  No acute      pelvic process.  Prior hysterectomy.   Serum lipase 33, BNP less than 30, lactic acid 1.8.  ESR 24.  Anemia  panel, serum iron 54, serum ferritin 36, serum folate greater than 20,  vitamin B12 1115.   CONSULTATIONS:  None.   BRIEF ADMITTING HISTORY AND PHYSICAL:  The patient is a 60 year old  African American lady with a past medical history of hypertension,  hyperlipidemia, bronchial asthma with chronic back pain who comes to the  emergency room with chief complaints of back pain that started 1 month  ago, insidious in onset, gradually worsening, intermittent initially  happening a few times daily, increased by walking, decreased by resting  and tramadol, 10/10 in severity, in the lower back midline initially  radiating to her right lower abdomen.  Over the last 2-3 days the pain  has been constant, radiating to her left lower quadrant abdomen and also  on to the anterior and medial aspect of the thigh and reports that  tramadol is not  helping her.  She denies any nausea, vomiting or upper  abdominal pain or fevers, chills, dysuria or blood in the urine.  On the  day of admission around 3:00 in the morning when she woke up to go to  the bathroom she had severe pain along with tingling and some numbness  and decided to come to the ER.  The patient was seen for similar  complaints in the outpatient clinic 2 weeks ago and had a renal  ultrasound that was negative for nephrolithiasis.  She denies any  urinary incontinence or bowel incontinence or weight loss and denies any  worsening pain with coughing, sneezing or Valsalva.  She denies any  other complaints.  The patient saw her orthopedic doctor for her  cervical pain related pain issues and did not follow up with her  orthopedic doctor secondary to her financial problems.   PHYSICAL EXAMINATION:  VITAL SIGNS:  Temperature 98.0, blood pressure  126/81, pulse rate of 101, respiratory rate 22, oxygen saturation 100%  on room air.  GENERAL:  The patient is not in any acute distress.  EYES:  Pupils equal, round and reacting to light.  Extraocular movements  are intact.  ENT:  Clear oropharynx.  NECK:  Neck is supple.  No lymphadenopathy.  RESPIRATORY:  Clear to auscultation bilaterally.  CARDIOVASCULAR:  S1, S2 regular rate and rhythm.  No murmurs, no rubs,  no gallops.  ABDOMEN:  Abdomen is soft, nondistended, nontender.  Bowel sounds are  positive.  RECTAL:  Normal anal sphincter tone.  Guaiac stools negative.  EXTREMITIES:  No pedal edema.  GENITOURINARY:  No CVA tenderness.  EXTREMITIES:  Back tenderness to palpation over the mid lower back  around L4-5-S1 areas and along the left to the midline area.  Left  straight leg rising test up to 45 degrees causes severe pain in her back  that is shooting on to her left medial thigh and left anterior thigh but  not crossing below the knee.  Complains of more tingling and numbness  around the entire leg mostly in the medial  side upon slight leg rising  test.  NEUROLOGIC:  Motor strength is 4/5 in the left lower extremity and 5/5  in the right lower extremity.   LABS:  On admission  CBC - white count 5.8, hemoglobin 11.7, platelet  count 117.  CMP - sodium 142, potassium 3.7, chloride 108, bicarb 23,  glucose 125, BUN 16, creatinine 1.29, total bilirubin 0.7, alkaline  phosphatase 55, AST 23, ALT 18, total protein 6.8, albumin 4.0, calcium  9.6.   HOSPITAL COURSE:  1. Acute on chronic lower back pain.  The patient does have chronic      lower back pain for more than 5 years and has been worked up as      pelvic pain, right lower quadrant abdominal pain and left lower      quadrant abdominal pain and had multiple CT scans that were      negative.  The patient's recent acute episode of the back pain has      been going on for 1 month associated with some tingling and      numbness sensation in the left lower extremity.  Upon straight leg      raise test up to 45 degrees the patient does have shooting pain in      the left lower extremity up to the middle of the anterior thigh and      does have worsening tingling and numbness sensation in the left      lower extremity.  The patient does not have any signs of sciatica      or radiculopathy or cauda equina syndrome.  The patient's CT scan      of the abdomen with contrast did show mild disk bulging at L3-4 and      L4-5.  After discussing with the radiologist as well as with Dr.      Clifton James we felt there is no need for an MRI as the disk bulging is      very mild.  The patient is to be managed conservatively with pain      management with tramadol and is to follow up with outpatient      clinic.  If the pain continues to worsen, physical therapy referral      would be beneficial for the patient.  Also might consider starting      amitriptyline or Cymbalta for chronic pain syndrome.  2. Hypertension, well-controlled.  Will continue her home medications.  3.  Hyperlipidemia.  Will continue her home medications.  4. Anemia.  The patient had a negative colonoscopy in 2002 and her      guaiac stools were negative during admission.  Her anemia panel did      show serum ferritin of 36 and serum iron of 54.  As the ferritin is      36 we did not start the iron supplementation therapy but the      patient is to follow up with outpatient clinic for monitoring of      the hemoglobin.  5. Chronic thrombocytopenia, unclear etiology.  Platelet count on the      day of discharge is 114.  The patient is to follow up at the      outpatient clinic.   DISCHARGE VITALS:  Temperature 98.1, pulse rate of 69, respiratory rate  20, blood pressure 116/69, oxygen saturation 95% on room air.   DISCHARGE LABS:  CBC - white count 7.3, hemoglobin 10.5, platelet count  114.  Basic metabolic panel - sodium [id number], potassium 3.6, chloride 105,  bicarb 28, glucose 88, BUN 14, creatinine 1.16, calcium 9.1.   The patient on the day of discharge is alert and oriented  x3, reported  pain is resolving, is not in any acute distress.  The patient is to  continue on all the medications mentioned in the discharge instructions.  The patient is to follow up with the outpatient clinic with Dr. Volanda Napoleon on  August 29, 2008, at 1:30 p.m.      Yolonda Kida, MD  Electronically Signed      Thomes Lolling, M.D.  Electronically Signed    VB/MEDQ  D:  07/27/2008  T:  07/27/2008  Job:  [id number]

## 2010-08-08 ENCOUNTER — Encounter (HOSPITAL_BASED_OUTPATIENT_CLINIC_OR_DEPARTMENT_OTHER): Payer: Medicare Other | Admitting: Oncology

## 2010-08-08 ENCOUNTER — Other Ambulatory Visit: Payer: Self-pay | Admitting: Physician Assistant

## 2010-08-08 DIAGNOSIS — R197 Diarrhea, unspecified: Secondary | ICD-10-CM

## 2010-08-08 DIAGNOSIS — Z5112 Encounter for antineoplastic immunotherapy: Secondary | ICD-10-CM

## 2010-08-08 DIAGNOSIS — C50919 Malignant neoplasm of unspecified site of unspecified female breast: Secondary | ICD-10-CM

## 2010-08-08 DIAGNOSIS — Z170 Estrogen receptor positive status [ER+]: Secondary | ICD-10-CM

## 2010-08-08 DIAGNOSIS — E860 Dehydration: Secondary | ICD-10-CM

## 2010-08-08 LAB — COMPREHENSIVE METABOLIC PANEL
ALT: 19 U/L (ref 0–35)
AST: 19 U/L (ref 0–37)
Albumin: 4.2 g/dL (ref 3.5–5.2)
Alkaline Phosphatase: 75 U/L (ref 39–117)
BUN: 33 mg/dL — ABNORMAL HIGH (ref 6–23)
CO2: 20 mEq/L (ref 19–32)
Calcium: 9.4 mg/dL (ref 8.4–10.5)
Chloride: 110 mEq/L (ref 96–112)
Creatinine, Ser: 1.4 mg/dL — ABNORMAL HIGH (ref 0.40–1.20)
Glucose, Bld: 109 mg/dL — ABNORMAL HIGH (ref 70–99)
Potassium: 4.1 mEq/L (ref 3.5–5.3)
Sodium: 142 mEq/L (ref 135–145)
Total Bilirubin: 0.4 mg/dL (ref 0.3–1.2)
Total Protein: 6.5 g/dL (ref 6.0–8.3)

## 2010-08-08 LAB — CBC WITH DIFFERENTIAL/PLATELET
BASO%: 1 % (ref 0.0–2.0)
Basophils Absolute: 0.1 10*3/uL (ref 0.0–0.1)
EOS%: 13.3 % — ABNORMAL HIGH (ref 0.0–7.0)
Eosinophils Absolute: 1 10*3/uL — ABNORMAL HIGH (ref 0.0–0.5)
HCT: 27.4 % — ABNORMAL LOW (ref 34.8–46.6)
HGB: 9.5 g/dL — ABNORMAL LOW (ref 11.6–15.9)
LYMPH%: 36.3 % (ref 14.0–49.7)
MCH: 30.5 pg (ref 25.1–34.0)
MCHC: 34.6 g/dL (ref 31.5–36.0)
MCV: 88.2 fL (ref 79.5–101.0)
MONO#: 0.5 10*3/uL (ref 0.1–0.9)
MONO%: 6.7 % (ref 0.0–14.0)
NEUT#: 3.3 10*3/uL (ref 1.5–6.5)
NEUT%: 42.7 % (ref 38.4–76.8)
Platelets: 215 10*3/uL (ref 145–400)
RBC: 3.11 10*6/uL — ABNORMAL LOW (ref 3.70–5.45)
RDW: 16.4 % — ABNORMAL HIGH (ref 11.2–14.5)
WBC: 7.8 10*3/uL (ref 3.9–10.3)
lymph#: 2.8 10*3/uL (ref 0.9–3.3)

## 2010-08-08 LAB — LACTATE DEHYDROGENASE: LDH: 185 U/L (ref 94–250)

## 2010-08-08 LAB — CANCER ANTIGEN 27.29: CA 27.29: 32 U/mL (ref 0–39)

## 2010-08-08 NOTE — Procedures (Signed)
[facility]  Patient:    [name], [name]                    MRN: [id number] Proc. Date: 06/30/00 Adm. Date:  [place]:[id number] Attending:  Sherrin Daisy CC:         Lindwood Qua, M.D.   Procedure Report  PROCEDURE:  Colonoscopy.  MEDICATIONS:  Fentanyl 100 mcg and Versed 7.5 mg IV.  INDICATIONS:  Family history of colon cancer.  SCOPE:  Pediatric video colonoscope.  DESCRIPTION OF PROCEDURE:  The procedure had been explained to the patient and consent obtained.  With the patient in the left lateral decubitus position, the Olympus adult video colonoscope was inserted and advanced under direct visualization.  The prep was excellent.  We were able to advance to the cecum without difficulty.  The ileocecal valve and appendiceal orifice were seen. The scope was withdrawn and the cecum, ascending colon, hepatic flexure, transverse colon, splenic flexure, descending and sigmoid colon were seen well upon removal.  No polyps or other lesions seen.  The scope was withdrawn.  The patient tolerated the procedure well.  ASSESSMENT:  No evidence of any colon polyps.  PLAN:  Due to her family history of colon cancer, I will recommend repeating in five years. DD:  06/30/00 TD:  06/30/00 Job: [id number]

## 2010-08-08 NOTE — Discharge Summary (Signed)
NAMELYNEA, STAATS NO.:  [id number]   MEDICAL RECORD NO.:  [id number]          PATIENT TYPE:  INP   LOCATION:  6709                         FACILITY:  [facility]   PHYSICIAN:  Jerelene Redden, MD      DATE OF BIRTH:  [birth year]   DATE OF ADMISSION:  04/30/2005  DATE OF DISCHARGE:  05/02/2005                                 DISCHARGE SUMMARY   [name] is a pleasant 60 year old lady who initially presented to  [facility] on April 30, 2005 with a several-day history of  nausea, watery diarrhea and intermittent vomiting. She also described a left  lower quadrant cramping abdominal pain. There was no associated fever. Of  note was that she had a CT scan of the abdomen done April 20, 2005 which  did not show any worrisome findings. Laboratory studies on presentation to  the emergency room included creatinine 1.2, potassium 3.8, white count of  8700, hemoglobin 11.1, normal liver functions. The patient was treated with  intravenous fluids as well as Phenergan and Zofran but continued to complain  of nausea and it was therefore felt prudent to admit her to the hospital for  evaluation.   Physical exam at time of admission, blood pressure is 156/92, temperature  was 100, pulse was 112, respirations 24. HEENT exam was within normal  limits. The chest was clear. Cardiovascular exam revealed normal S1-S2  without rubs, murmurs or gallops. On the abdominal exam, there were normal  bowel sounds. There was no guarding or rebound. There was mild tenderness to  deep palpation in left lower quadrant area.   Subsequently, studies obtained included a lipase of 61, a normal liver  profile. Serial blood cultures which were negative. Stools for C. difficile  x2 which were negative. A urinalysis revealed few white cells and many  bacteria. A urine culture grew greater than 100,000 colonies of E-coli  species. In light of the patient's complaints of abdominal  discomfort, the  CT scan of the abdomen and pelvis was repeated. This showed no changes when  compared to the examination of April 20, 2005. Fluid was noted in the  large bowel but there was no evidence of obstruction. The CT scan of the  pelvis was normal. A chest x-ray was also obtained which was negative. On  admission, the patient was basically treated symptomatically. She was given  IV of normal saline with 10 mEq of KCl at 125 cc/hour. She was placed on a  clear liquid diet and was given Zofran as needed for nausea. She was also  placed empirically on Protonix 40 milligrams daily. Her Neurontin and  benazepril were continued. When it was noted that she had evidence of  urinary tract infection, she was started on Cipro 40 milligrams IV every 12  hours. This was on April 30, 2005. By May 02, 2005, the abdominal  pain, nausea and vomiting were greatly improved. She was taking a soft diet.  She was still having loose stools. These seemed to be less frequent,  however. I told [name] that I would be happy to keep her  in the  hospital for a more prolonged period in order to ensure that her symptoms  had resolved. However, she felt strongly that she wanted to go home.  Therefore on May 02, 2005, she was discharged.   DISCHARGE DIAGNOSIS:  1.  Acute gastroenteritis, possibly of viral origin.  2.  Hypertension.  3.  Hyperlipidemia.  4.  Urinary tract infection.  5.  Chronic back pain.  6.  Status post total abdominal hysterectomy.  7.  History of cervical laminectomy.   PLAN:  The patient will be given Cipro 250 milligrams b.i.d. to complete a  seven-day course. She will be instructed to continue her usual medications  which I believe consist of Neurontin 800 milligrams t.i.d., Cymbalta 60  milligrams daily and benazepril 10 milligrams daily. She was advised to  follow up with Dr. Darron Doom in the office next week. Condition at time of  discharge was good.            ______________________________  Jerelene Redden, MD     SY/MEDQ  D:  05/02/2005  T:  05/02/2005  Job:  [id number]   cc:   Santiago Glad L. Darron Doom, M.D.  Fax: [phone]

## 2010-08-08 NOTE — H&P (Signed)
[name], Leach NO.:  [id number]   MEDICAL RECORD NO.:  [id number]          PATIENT TYPE:  INP   LOCATION:  1843                         FACILITY:  [facility]   PHYSICIAN:  Jerelene Redden, MD      DATE OF BIRTH:  05-11-1950   DATE OF ADMISSION:  04/30/2005  DATE OF DISCHARGE:                                HISTORY & PHYSICAL   [name] is a pleasant 60 year old lady who states that she has been  feeling poorly for approximately the last 10-14 days.  During that time she  has had an illness characterized by recurrent nausea, diarrhea, and  intermittent vomiting.  She states that she has had pain which is sometimes  in the left lower quadrant of the abdomen and seems to radiate across to the  right lower quadrant.  She does not think that she has had any fever.  She  states that she has had an occasional mild cough.  There has been no  hematemesis or melena.  She has presented both to the emergency room and to  Dr. Darron Doom for evaluation of these symptoms.  On January 29 she had a CT  scan of the abdomen which showed the presence of a renal cyst and some  nonspecific fluid in the pelvis, but no worrisome findings.  Today she  returned to the emergency room.  Laboratory studies obtained included a  glucose of 140.  The creatinine was 1.2, potassium 3.8.  CBC revealed a  white count of 8700, hemoglobin 11.1.  Liver profile was normal.  Lipase was  61.  The patient was given intravenous fluids and intravenous Phenergan and  Zofran.  She continued to experience nausea and lower abdominal discomfort  and it was decided because of the chronicity of her symptoms that it was  prudent to admit her to the hospital for further evaluation.   PAST MEDICAL HISTORY:   CURRENT MEDICATIONS:  1.  The patient is currently taking Neurontin possibly at a dose of 800 mg      t.i.d.  2.  Cymbalta 60 mg daily.  3.  Benazepril 10 mg daily.  4.  I believe that she was taking  Micardis but this has been discontinued.  5.  She is also taking Crestor 10 mg daily for hyperlipidemia.  6.  She was taking Celebrex recently but this was discontinued.   There are no known drug allergies.   OPERATIONS:  1.  Her most recent operation was an anterior cervical diskectomy and fusion      C4-C6.  2.  She has also had a total abdominal hysterectomy.   She does not recall ever being hospitalized for any medical illnesses.   FAMILY HISTORY:  Significant for hypertension, hyperlipidemia, and heart  disease in her siblings.  She states there is also a history of breast and  colon cancer in her siblings.  Of note is that the patient had a colonoscopy  which she believes was approximately two years ago.  I find a record in the  computer of a colonoscopy done four years ago which was negative.  This was  done by Dr. Oletta Lamas.   SOCIAL HISTORY:  The patient does not smoke.  She does not abuse alcohol or  drugs.   REVIEW OF SYSTEMS:  HEENT:  Head:  She denies headache or dizziness.  Eyes:  She denies visual blurring or diplopia.  Ear, nose, and throat:  She denies  ear ache or sinus pain or sore throat.  CHEST:  Denies coughing, wheezing,  or chest congestion.  CARDIOVASCULAR:  Denies orthopnea, PND, or ankle  edema.  GI:  See above.  GU:  Denies dysuria or urinary frequency.  There  has been no hematuria.  NEURO:  Denies history of seizure or stroke.  ENDO:  Denies excessive thirst, urinary frequency, or nocturia.   PHYSICAL EXAMINATION:  VITAL SIGNS:  Blood pressure 156/92, temperature 100,  pulse 112, respirations 24.  HEENT:  Within normal limits.  CHEST:  Clear.  BACK:  No CVA or point tenderness.  CARDIOVASCULAR:  Normal S1, S2 without murmurs, rubs, or gallops.  ABDOMEN:  Normal bowel sounds.  The upper quadrants are benign to palpation.  On examination of the lower quadrants of the abdomen there is no guarding or  rebound.  She reports tenderness to palpation  especially in the left lower  quadrant.  NEUROLOGIC:  Normal.  EXTREMITIES:  Normal.   IMPRESSION:  1.  Recurrent nausea, vomiting, left lower quadrant pain, and diarrhea of      uncertain etiology.  2.  Hypertension.  3.  Hyperlipidemia.  4.  Chronic back pain.  5.  History of total abdominal hysterectomy.  6.  History of cervical laminectomy.  7.  Mild increased glucose.   PLAN:  Will place her on a clear liquid diet and administer IV fluids.  At  this point I think it would be prudent to repeat the CT scan of the abdomen  with attention to the pelvis in light of her persistent symptoms.  Will give  her IV Protonix and obtain stool specimens for C. difficile and enteric  pathogens.  Will also obtain blood cultures x2 sets.  She has not had a  urinalysis done so I will make sure that this is performed.  Will follow her  closely.           ______________________________  Jerelene Redden, MD     SY/MEDQ  D:  04/30/2005  T:  04/30/2005  Job:  [id number]   cc:   Santiago Glad L. Darron Doom, M.D.  Fax: [phone]

## 2010-08-08 NOTE — Op Note (Signed)
NAME:  SARAE, STEINKAMP                       ACCOUNT NO.:  [id number]   MEDICAL RECORD NO.:  [id number]                   PATIENT TYPE:  INP   LOCATION:  5002                                 FACILITY:  [facility]   PHYSICIAN:  Jessy Oto, M.D.                DATE OF BIRTH:  03/28/1950   DATE OF PROCEDURE:  10/26/2003  DATE OF DISCHARGE:                                 OPERATIVE REPORT   PREOPERATIVE DIAGNOSIS:  Herniated nucleus pulposus left C5-C6 and left C4-  C5 foraminal stenosis.   POSTOPERATIVE DIAGNOSIS:  Herniated nucleus pulposus left C5-C6, foraminal  stenosis left C4-C5.   PROCEDURE:  Anterior cervical discectomy and fusion, C4-C5 and C5-C6, with  right iliac crest bone graft harvested through a separate incision, internal  fixation of the two segments with a 43 mm six hole DePuy cervical locking  plate and six screws, two 13 mm screws at the C4 level, two 12 mm screws at  the C5 level, and two 13 mm screws at the C6 level, a revision screw placed  at the C6 level of 14 mm.   SURGEON:  Jessy Oto, M.D.   ASSISTANT:  Epimenio Foot, P.A.-C.   ANESTHESIA:  GOT, Dr. Lona Millard BLOOD LOSS:  100 mL.   DRAINS:  Foley catheter to straight drain, 10 French TLS drain left neck.   HISTORY OF PRESENT ILLNESS:  This 60 year old female who apparently has been  followed by Dr. Tonita Cong, has been experiencing pain in her neck and radiation  to her left arm since February or March of this year.  The pain is severe  with radiation into the left arm, underwent initial evaluation and  conservative management has been unsuccessful in relieving her pain.  She  complains of weakness in the left arm and numbness in all the digits of the  left arm with a pulsating discomfort.  She underwent EMGs and nerve  conduction studies that demonstrated findings consistent with C5-C6 cervical  radiculopathy.  MRI study demonstrated areas of foraminal narrowing  involving the left C4-C5  and left C5-C6 foramen specifically the left C5 and  C6 nerve roots.  The patient is brought to the operating room to undergo  intervention in the form of anterior discectomy and fusion with  decompression of the nerve roots.  Anterior discectomy and fusion was chosen  as opposed to foraminotomy because of the patient's proximal pain, shoulder,  and neck as well as symptoms of radicular pain into the left upper  extremity.   OPERATIVE FINDINGS:  Left sided disc protrusion affecting the left C6 nerve  root and the left C5-C6 neural foramen.  Severe foraminal entrapment  affecting the C5 nerve root.   DESCRIPTION OF PROCEDURE:  After adequate general anesthesia, the patient in  the beach chair position with the neck in slight extension, 5 pounds  cervical halter retraction, bolster transverse at the shoulder  blade level,  bmp under the right buttock to elevate the right iliac crest, tape used to  hold the abdominal pannus to the left side, not under extreme pressure.  A  Foley catheter placed, standard preoperative antibiotics.  Prep of the  anterior neck and right iliac crest with DuraPrep solution and draped in the  usual manner.  An incision over the left side of the neck approximately 3  fingerbreadths above the medial clavicle in line with the patient's skin  crease after infiltration with Marcaine 0.5% with 1:200,000 epinephrine.  The incision was made through the skin and subcutaneous layers down to the  platysmal layer.  This was incised and immediately small veins were  encountered and medium size veins that bled.  Hemostat was used to control  these and these were each ligated and suture ligated as well as ligatures  hand placed to control the bleeders.  Three medium size veins were  localized, each of them carefully, exposed, then clamped, ligated, and  divided.  The omohyoid muscle was identified, brought up into the incision,  and divided to allow for further exposure of the  anterior aspect of the  neck.  The interval between the tracheal and esophagus medially and the  carotid sheath laterally was then developed using blunt dissection with  finger dissection to the anterior aspect of the cervical spine.  The  prevertebral fascia incised along the medial border of the longus colli  muscles using electrocautery across the midline.  The longus colli muscle  was then carefully freed up on both sides of the anterior aspect of the  cervical spine.  The disc space with a large spur anteriorly felt to be the  C4-C5 level, however, interoperative lateral radiograph demonstrated the  needle at this level as well as a needle at the level below so that the  patient's size unfortunately prevented identification of the level in its  entirety so that under loupe magnification and with the handheld Clowards,  the lower needle was then replaced into the disc space above and had a large  anterior spur.  A second interoperative lateral radiograph was obtained of  the cervical spine with retraction of the arms and traction distally  allowing for exposure of the C4-C5 and C5-C6 levels and the needles were  seen to be placed at the segments marking them appropriately.   Again, returning to using the handheld Clowards, first the lower needle was  removed under direct observation and a portion of the disc excised using a  15 blade scalpel and pituitary rongeurs and then similarly, the upper needle  was removed, again with a 15 blade scalpel and pituitary rongeur used to  excise a small portion of the disc anterior for continued identification  throughout the case.  The medial border of the longus colli muscle again  carefully freed up on both sides over C4 through C6 to allow for exposure of  the anterior aspect of the cervical spine over these levels as well as  insertion of the self-retaining retractor.  A McCullough retractor was inserted with the foot of the blade beneath the  medial border of the longus  colli muscle to allow for retraction and exposure here and protection of the  soft tissue structures medial and lateral.  It was first placed at the C5-C6  level.   Note that while x-rays were being taken and developed of the neck, an  incision was made over the right iliac crest, approximately 2  1/2 to 3  inches in length, through the subcu layers down to the iliac crest  superiorly.  This was incised in lien with the crest and subperiosteal  dissection used to expose the superior, superomedial, and superolateral  borders of the iliac crest here.  This exposure was packed with an irrigant  solution soaked sponge and then attention was returned to the neck for  continued exposure of the neck.   Note that beneath the neck, itself, one of the thyroid vessels was localized  and required ligation to allow for further exposure of the anterior aspect  of the cervical spine deep.  With this completed, a Boss retractor was  inserted with the foot of the blade beneath the medial border of the longus  colli muscle.  14 mm screw post was then inserted into the anterior aspect  of the vertebral body of C5 and C6, distraction obtained across the disc  space, 3 mm Kerrison used to excise large anterior osteophytes over the  anterior inferior aspect of C5 and over the superior aspect of C6 debriding  the anterior portion of the annulus and osteophytes here.  A 2-0  microcurrent was used to debride the disc space of cartilaginous attachments  as well as disc material.  Pituitary rongeurs were used to debride the disc  space posteriorly.  The operating room microscope was draped sterilely and  brought into the field.  Under direct visualization, a high speed bur was  then used to carefully smooth and remove any further cartilaginous debris  from the endplates down to the bleeding bony endplates over the inferior  aspect of C5 and superior aspect of C6.  The posterior lip  osteophytes were  carefully burred, smoothed, and thinned, both the posterior superior aspect  of the disc space and posterior inferior aspect of the disc space.  A 1 mm  Kerrison was then able to be introduced and used to excise the lip  osteophytes over the posterior aspect of the disc space.  Foraminotomy was  then performed over both the right side and the left side.  This using 1 mm  and then 2 mm Kerrisons.  The posterior annulus was excised in its entirety.  Several fragments of disc material was noted extending out into the left  neural foramen affecting the left C6 nerve root.  These were debrided using  both 2 mm Kerrison as well as pituitary rongeurs relieving pressure on the  left C6 nerve root.  When decompression had been completed, the C6 nerve  root was observed to be exiting out the foramen and it was beginning to  course anterolaterally just beyond the uncovertebral joint resected.  It was felt that complete decompression had been carried out at this point.  Note  that disc material was also found to be present slightly on the right side  of the C5-C6 level and this was resected and foraminotomy performed of the  right C6 nerve root.  The disc space was then measured for depth using the  Cloward depth gauge and measured approximately 15 mm.  The height of the  intravertebral disc space was measured using  sounder and measuring it with  a #8 sounder and an 8 mm width graft was chosen.  Irrigation was performed  here.  The right iliac crest bone graft harvest site then reopened, Cobbs  placed medially and a handheld Cloward laterally, dual oscillating saw set  at a width of 8 mm was then used to  harvest bone graft from the right  anterior iliac crest about 3 fingerbreadths posterior to the anterior iliac  spine.  First, cutting using the dual oscillating saw and then dividing the  base using a 1/4 inch osteotome, curved.  This graft was then carefully  tapered to match the  intravertebral disc space.  Because of the width of the  graft, one of the cortex was removed and a J-type graft was used measuring a  depth of approximately 12 to 13 mm and a width of a very similar amount with  height of 8 mm.  The edges of the graft were carefully smoothed to allow for  locking of the graft into place.   Irrigation was then performed in the intervertebral disc space.  Careful  inspection demonstrated no bony debris or soft tissue remaining that could  be retropulsed with insertion of the graft.  The graft was then placed over  the intervertebral space at C5-C6 and impacted into place sub setting it  about 1 mm.  The screw posts were then moved to the C6 level.  Bone wax was  applied to the bleeding screw post hole obtaining good hemostasis.  The Boss  retractor was removed from the incision  and replaced at the next level up  at the C4-C5 level with the retractor beneath the medial border of the  longus colli muscles.  A distraction pin, 14 mm, screw posts were then  placed in the vertebral body of C4 and distraction obtained across this  intervertebral disc space.  The endplate osteophytes were then resected  anteriorly using 3 mm Kerrison as well as the anterior annulus resected this  way.  Curettage was performed of the cartilaginous endplates of [id number] back  posteriorly removing both disc material and cartilaginous endplates back to  the posterior aspect of the disc annulus at C4-C5.  The operating room  microscope was used throughout this particular level and a high speed bur  then used to carefully remove the cartilaginous endplate from the inferior  aspect of C4 and the superior aspect of C5, again, posteriorly back to the  level of the posterior aspect of the disc space.  A 1 mm Kerrison was then  used to carefully debride the posterior lip osteophytes of the C4-C5 level  on both sides.  Foraminotomy was then performed over the left C5 nerve root using 1 and then  2 mm Kerrisons resecting uncovertebral spurs out into the  foramen until the left C5 nerve root was felt to be completely free.  It  appeared to be exiting in an anterior lateral direction beyond the  uncovertebral spurs that were present previously and resected.  Following  irrigation, careful inspection demonstrated no further compression of the  cord or thecal sac.  A high speed bur was then used to carefully smooth  parallel the endplates at [id number].  The height of the intervertebral disc  space measured at 8 using the sounder.  The depth measuring, again, 15 mm.  The Cloward depth gauge was used.  Returning to the iliac crest, graft was  then harvested just anterior to the previous graft using a dual oscillating  saw, 8 mm width, going from superiorly, protecting the soft tissue  structures medial and lateral.  This completed, the base was then divided  using a 1/4 inch osteotome.  This completed, the graft was carefully tapered  to measure the intervertebral disc space, depth of about 13-14 mm, height of  8 mm,  width of about 12 mm.  This graft was then placed over the  intervertebral disc space with care taken to insure that the disc space was  cleared of all bony or soft tissue debris to insure that none was  retropulsed with insertion of the graft, the graft then impacted into place  without difficulty.  Following the insertion of the graft, the graft was  placed to the level of the anterior lip of this level.  The screw posts were  then removed from the C4 and C5 vertebral bodies, bone wax applied to the  screw post holes.   Careful retraction superiorly and inferiorly protected the esophagus and  soft tissue structures.  A high speed bur was then used to carefully smooth  the anterior aspect of the intervertebral disc space of C4-C5 and C5-C6  smoothing these spurs to allow for acceptance of the plate kneeling against  the anterior aspect of the cervical spine from C4 to C6.  Once  this was  completed, both at C4-C5 and C5-C6, then the expected length of the plate  was chosen by measuring with a bone wax coated cottonoid string along the  anterior aspect of the cervical spine approximately 37 mm between the screw  holes, a 43 mm six hole plate was chosen.  This was placed against the  anterior aspect of the cervical spine and appeared to give the best fit and  alignment.  Temporary retaining pins were then placed into the vertebral  body of C6 and C4 to hold the plate in good position and alignment.  The 5  pounds of cervical traction was removed prior to the insertion of the plate  and with the plate in place, screw holes were first drilled at the C4 level,  both left and right, and 13 mm screws placed here.  Screw holes were then  placed at the C5 level both right and left protecting the soft tissue  structures, 12 mm screws were placed at this level.  At the C6 level, a 13  mm screw was able to be placed on the right side.  A 14 mm revision screw was placed on the left side as the 13 mm screw did not appear to obtain  purchase and it was felt there was adequate room to accept a 14 mm screw at  this level.  This completed, each of the locking nuts were then turned to  engage the plate and lock the plate to the screws from C4 to C6, all six  screws were locked into place.  Interoperative lateral radiograph with  longitudinal traction of the arms demonstrated the plates and screws in  excellent position and alignment, no evidence of retropulsion of bone graft  material, and no evidence of posterior placement of the screws, plates, or  metallic hardware.   Following irrigation, a 10 French drain was placed in the depths of the  incision exiting out the anterior medial aspect of the incision.  A small  vein was suture ligated where a suture had come loose during the procedure.  No other veins appeared to be showing any significant loss of hemostasis.  Following  further irrigation, examination of the esophagus demonstrated no  abnormalities and the incision was then allowed to fall back into place.  The platysmal layer was approximated with interrupted 3-0 Vicryl sutures,  the deep subcu layers with interrupted 3-0 Vicryl suture, and the skin was  closed with a running stitch of 4-0 Vicryl.  Tincture of Benzoin and Steri-  Strips were applied.  The TLS drawn was sewn in place with 4-0 nylon suture.  The right iliac crest bone graft harvest site was closed simultaneous of the  neck.  Bone wax was applied to the bleeding cancellous bone surfaces.  The  edges of the resection graft were carefully smoothed.  Gelfoam was applied  to the iliac crest bone graft harvest site.  The fascial layer was then  reapproximated over the iliac crest using #1 Vicryl suture.  The deep subcu  layer was reapproximated with interrupted #1 Vicryl suture and the more  superficial layer with interrupted 0 and 2-0 Vicryl suture, and the skin was  closed with a running subcu stitch of 4-0 Vicryl.  Tincture  of Benzoin and  Steri-Strips were applied.  4 by 4s gauze sponge was applied to the right  iliac crest and left neck and affixed it in place with tape.  The TLS drain  was charged.  The patient was placed in a [place] collar, reactivated,  extubated, and returned to the recovery room in satisfactory condition.  All  instrument and sponge counts were correct.                                               Jessy Oto, M.D.    Liz Malady  D:  10/26/2003  T:  10/27/2003  Job:  [id number]

## 2010-08-21 ENCOUNTER — Other Ambulatory Visit (HOSPITAL_COMMUNITY): Payer: Medicare Other | Admitting: Radiology

## 2010-08-21 ENCOUNTER — Ambulatory Visit: Payer: Medicare Other | Admitting: Internal Medicine

## 2010-08-28 ENCOUNTER — Encounter (HOSPITAL_BASED_OUTPATIENT_CLINIC_OR_DEPARTMENT_OTHER): Payer: Medicare Other | Admitting: Oncology

## 2010-08-28 ENCOUNTER — Other Ambulatory Visit: Payer: Self-pay | Admitting: Oncology

## 2010-08-28 DIAGNOSIS — C50919 Malignant neoplasm of unspecified site of unspecified female breast: Secondary | ICD-10-CM

## 2010-08-28 DIAGNOSIS — Z170 Estrogen receptor positive status [ER+]: Secondary | ICD-10-CM

## 2010-08-28 DIAGNOSIS — Z5112 Encounter for antineoplastic immunotherapy: Secondary | ICD-10-CM

## 2010-08-28 LAB — COMPREHENSIVE METABOLIC PANEL
ALT: 17 U/L (ref 0–35)
AST: 20 U/L (ref 0–37)
Albumin: 4.3 g/dL (ref 3.5–5.2)
Alkaline Phosphatase: 70 U/L (ref 39–117)
BUN: 34 mg/dL — ABNORMAL HIGH (ref 6–23)
CO2: 25 mEq/L (ref 19–32)
Calcium: 9.6 mg/dL (ref 8.4–10.5)
Chloride: 107 mEq/L (ref 96–112)
Creatinine, Ser: 1.72 mg/dL — ABNORMAL HIGH (ref 0.50–1.10)
Glucose, Bld: 95 mg/dL (ref 70–99)
Potassium: 4.5 mEq/L (ref 3.5–5.3)
Sodium: 143 mEq/L (ref 135–145)
Total Bilirubin: 0.4 mg/dL (ref 0.3–1.2)
Total Protein: 6.8 g/dL (ref 6.0–8.3)

## 2010-08-28 LAB — CBC WITH DIFFERENTIAL/PLATELET
BASO%: 0.7 % (ref 0.0–2.0)
Basophils Absolute: 0 10*3/uL (ref 0.0–0.1)
EOS%: 11.2 % — ABNORMAL HIGH (ref 0.0–7.0)
Eosinophils Absolute: 0.6 10*3/uL — ABNORMAL HIGH (ref 0.0–0.5)
HCT: 28.5 % — ABNORMAL LOW (ref 34.8–46.6)
HGB: 9.7 g/dL — ABNORMAL LOW (ref 11.6–15.9)
LYMPH%: 38.6 % (ref 14.0–49.7)
MCH: 30.1 pg (ref 25.1–34.0)
MCHC: 34.1 g/dL (ref 31.5–36.0)
MCV: 88.2 fL (ref 79.5–101.0)
MONO#: 0.3 10*3/uL (ref 0.1–0.9)
MONO%: 5.9 % (ref 0.0–14.0)
NEUT#: 2.5 10*3/uL (ref 1.5–6.5)
NEUT%: 43.6 % (ref 38.4–76.8)
Platelets: 239 10*3/uL (ref 145–400)
RBC: 3.23 10*6/uL — ABNORMAL LOW (ref 3.70–5.45)
RDW: 14.7 % — ABNORMAL HIGH (ref 11.2–14.5)
WBC: 5.8 10*3/uL (ref 3.9–10.3)
lymph#: 2.2 10*3/uL (ref 0.9–3.3)

## 2010-08-29 ENCOUNTER — Encounter (HOSPITAL_BASED_OUTPATIENT_CLINIC_OR_DEPARTMENT_OTHER): Payer: Medicare Other | Admitting: Oncology

## 2010-08-29 DIAGNOSIS — C50919 Malignant neoplasm of unspecified site of unspecified female breast: Secondary | ICD-10-CM

## 2010-08-29 DIAGNOSIS — Z170 Estrogen receptor positive status [ER+]: Secondary | ICD-10-CM

## 2010-08-29 DIAGNOSIS — Z5111 Encounter for antineoplastic chemotherapy: Secondary | ICD-10-CM

## 2010-09-18 ENCOUNTER — Other Ambulatory Visit: Payer: Self-pay | Admitting: Physician Assistant

## 2010-09-18 ENCOUNTER — Encounter (HOSPITAL_BASED_OUTPATIENT_CLINIC_OR_DEPARTMENT_OTHER): Payer: Medicare Other | Admitting: Oncology

## 2010-09-18 DIAGNOSIS — Z5111 Encounter for antineoplastic chemotherapy: Secondary | ICD-10-CM

## 2010-09-18 DIAGNOSIS — R197 Diarrhea, unspecified: Secondary | ICD-10-CM

## 2010-09-18 DIAGNOSIS — Z170 Estrogen receptor positive status [ER+]: Secondary | ICD-10-CM

## 2010-09-18 DIAGNOSIS — E860 Dehydration: Secondary | ICD-10-CM

## 2010-09-18 DIAGNOSIS — C50919 Malignant neoplasm of unspecified site of unspecified female breast: Secondary | ICD-10-CM

## 2010-09-18 LAB — CBC WITH DIFFERENTIAL/PLATELET
BASO%: 0.4 % (ref 0.0–2.0)
Basophils Absolute: 0 10*3/uL (ref 0.0–0.1)
EOS%: 10.8 % — ABNORMAL HIGH (ref 0.0–7.0)
Eosinophils Absolute: 0.7 10*3/uL — ABNORMAL HIGH (ref 0.0–0.5)
HCT: 31.3 % — ABNORMAL LOW (ref 34.8–46.6)
HGB: 10.3 g/dL — ABNORMAL LOW (ref 11.6–15.9)
LYMPH%: 39.3 % (ref 14.0–49.7)
MCH: 28.4 pg (ref 25.1–34.0)
MCHC: 32.9 g/dL (ref 31.5–36.0)
MCV: 86.2 fL (ref 79.5–101.0)
MONO#: 0.4 10*3/uL (ref 0.1–0.9)
MONO%: 5.6 % (ref 0.0–14.0)
NEUT#: 3 10*3/uL (ref 1.5–6.5)
NEUT%: 43.9 % (ref 38.4–76.8)
Platelets: 220 10*3/uL (ref 145–400)
RBC: 3.63 10*6/uL — ABNORMAL LOW (ref 3.70–5.45)
RDW: 13.1 % (ref 11.2–14.5)
WBC: 6.8 10*3/uL (ref 3.9–10.3)
lymph#: 2.7 10*3/uL (ref 0.9–3.3)
nRBC: 0 % (ref 0–0)

## 2010-09-18 LAB — COMPREHENSIVE METABOLIC PANEL
ALT: 23 U/L (ref 0–35)
AST: 21 U/L (ref 0–37)
Albumin: 4.1 g/dL (ref 3.5–5.2)
Alkaline Phosphatase: 73 U/L (ref 39–117)
BUN: 26 mg/dL — ABNORMAL HIGH (ref 6–23)
CO2: 26 mEq/L (ref 19–32)
Calcium: 9.6 mg/dL (ref 8.4–10.5)
Chloride: 105 mEq/L (ref 96–112)
Creatinine, Ser: 1.51 mg/dL — ABNORMAL HIGH (ref 0.50–1.10)
Glucose, Bld: 98 mg/dL (ref 70–99)
Potassium: 4.2 mEq/L (ref 3.5–5.3)
Sodium: 141 mEq/L (ref 135–145)
Total Bilirubin: 0.4 mg/dL (ref 0.3–1.2)
Total Protein: 6.9 g/dL (ref 6.0–8.3)

## 2010-09-18 LAB — BRAIN NATRIURETIC PEPTIDE: Brain Natriuretic Peptide: 14.8 pg/mL (ref 0.0–100.0)

## 2010-09-18 LAB — CANCER ANTIGEN 27.29: CA 27.29: 32 U/mL (ref 0–39)

## 2010-10-10 ENCOUNTER — Other Ambulatory Visit: Payer: Self-pay | Admitting: Physician Assistant

## 2010-10-10 ENCOUNTER — Encounter (HOSPITAL_BASED_OUTPATIENT_CLINIC_OR_DEPARTMENT_OTHER): Payer: Medicare Other | Admitting: Oncology

## 2010-10-10 DIAGNOSIS — Z170 Estrogen receptor positive status [ER+]: Secondary | ICD-10-CM

## 2010-10-10 DIAGNOSIS — Z5111 Encounter for antineoplastic chemotherapy: Secondary | ICD-10-CM

## 2010-10-10 DIAGNOSIS — C50919 Malignant neoplasm of unspecified site of unspecified female breast: Secondary | ICD-10-CM

## 2010-10-10 DIAGNOSIS — Z5112 Encounter for antineoplastic immunotherapy: Secondary | ICD-10-CM

## 2010-10-10 LAB — CBC WITH DIFFERENTIAL/PLATELET
BASO%: 1 % (ref 0.0–2.0)
Basophils Absolute: 0.1 10*3/uL (ref 0.0–0.1)
EOS%: 19.1 % — ABNORMAL HIGH (ref 0.0–7.0)
Eosinophils Absolute: 1.3 10*3/uL — ABNORMAL HIGH (ref 0.0–0.5)
HCT: 30.9 % — ABNORMAL LOW (ref 34.8–46.6)
HGB: 10.4 g/dL — ABNORMAL LOW (ref 11.6–15.9)
LYMPH%: 31.9 % (ref 14.0–49.7)
MCH: 28.6 pg (ref 25.1–34.0)
MCHC: 33.7 g/dL (ref 31.5–36.0)
MCV: 84.9 fL (ref 79.5–101.0)
MONO#: 0.4 10*3/uL (ref 0.1–0.9)
MONO%: 6.1 % (ref 0.0–14.0)
NEUT#: 2.9 10*3/uL (ref 1.5–6.5)
NEUT%: 41.9 % (ref 38.4–76.8)
Platelets: 217 10*3/uL (ref 145–400)
RBC: 3.64 10*6/uL — ABNORMAL LOW (ref 3.70–5.45)
RDW: 12.9 % (ref 11.2–14.5)
WBC: 7 10*3/uL (ref 3.9–10.3)
lymph#: 2.2 10*3/uL (ref 0.9–3.3)
nRBC: 0 % (ref 0–0)

## 2010-10-10 LAB — COMPREHENSIVE METABOLIC PANEL
ALT: 29 U/L (ref 0–35)
AST: 27 U/L (ref 0–37)
Albumin: 4 g/dL (ref 3.5–5.2)
Alkaline Phosphatase: 77 U/L (ref 39–117)
BUN: 24 mg/dL — ABNORMAL HIGH (ref 6–23)
CO2: 27 mEq/L (ref 19–32)
Calcium: 9.5 mg/dL (ref 8.4–10.5)
Chloride: 107 mEq/L (ref 96–112)
Creatinine, Ser: 1.7 mg/dL — ABNORMAL HIGH (ref 0.50–1.10)
Glucose, Bld: 102 mg/dL — ABNORMAL HIGH (ref 70–99)
Potassium: 4.2 mEq/L (ref 3.5–5.3)
Sodium: 139 mEq/L (ref 135–145)
Total Bilirubin: 0.4 mg/dL (ref 0.3–1.2)
Total Protein: 6.9 g/dL (ref 6.0–8.3)

## 2010-10-16 ENCOUNTER — Encounter: Payer: Self-pay | Admitting: Internal Medicine

## 2010-10-16 ENCOUNTER — Telehealth: Payer: Self-pay | Admitting: *Deleted

## 2010-10-16 ENCOUNTER — Ambulatory Visit (INDEPENDENT_AMBULATORY_CARE_PROVIDER_SITE_OTHER): Payer: Medicare Other | Admitting: Internal Medicine

## 2010-10-16 DIAGNOSIS — J45991 Cough variant asthma: Secondary | ICD-10-CM

## 2010-10-16 DIAGNOSIS — I10 Essential (primary) hypertension: Secondary | ICD-10-CM

## 2010-10-16 MED ORDER — PREDNISONE 10 MG PO TABS: ORAL_TABLET | ORAL | Status: DC

## 2010-10-16 MED ORDER — FLUTICASONE-SALMETEROL 250-50 MCG/DOSE IN AEPB: 1.0000 | INHALATION_SPRAY | Freq: Two times a day (BID) | RESPIRATORY_TRACT | Status: DC

## 2010-10-16 MED ORDER — ALBUTEROL 90 MCG/ACT IN AERS: 2.0000 | INHALATION_SPRAY | RESPIRATORY_TRACT | Status: DC | PRN

## 2010-10-16 NOTE — Progress Notes (Signed)
History of present illness: [name] is a 60 year old woman with past medical history of asthma, hypertension presents today for shortness of breath. She states that she has been having shortness of breath and wheezing in the past 3 days that mainly occur at nighttime which keeps her awake during the night.  She has been using her albuterol and Advair twice daily with mild improvement. In addition, she is sleeping with the [facility] AS WELL as the fan on at night.  She also has a productive cough that is white/yellowish in color. Denies any blood in the sputum, fevers, chills, nausea or vomiting, chest pain.  She is in a hurry to go to her daughter's appointment today and states that if she continued to get worse she will return to the clinic for further evaluation. She does not have a peak meter flow a home and reports that she has never been intubated for her asthma exacerbation in the past.  She normally has symptoms  1-2 times per week and states that the Advair and albuterol do help control her symptoms.   Review of systems: As per history of present illness  Physical examination:  General: alert, well-developed, and cooperative to examination.  Lungs: normal respiratory effort, no accessory muscle use, normal breath sounds, no crackles, and mild expiratory wheezes throughout lung fields. Heart: normal rate, regular rhythm, no murmur, no gallop, and no rub.  Abdomen: soft, non-tender, normal bowel sounds, no distention, no guarding, no rebound tenderness Msk: no joint swelling, no joint warmth, and no redness over joints.  Pulses: 2+ DP/PT pulses bilaterally Extremities: No cyanosis, clubbing, edema Neurologic: alert & oriented X3, cranial nerves II-XII intact, strength normal in all extremities, sensation intact to light touch, and gait normal.  Skin: turgor normal and no rashes.  Psych: Oriented X3, memory intact for recent and remote, normally interactive, good eye contact, not anxious appearing,  and not depressed appearing.

## 2010-10-16 NOTE — Assessment & Plan Note (Signed)
Well controlled. Will continue current regimen.

## 2010-10-16 NOTE — Assessment & Plan Note (Addendum)
Mild asthma exacerbation.  On physical exam, lung sounds were equal bilaterally, no crackles, positive mild scattered wheezes diffusely. No evidence of upper lower respiratory infection at this time as patient has been afebrile, therefore, antibiotics is not indicated. The patient can speak in complete sentences without shortness of breath, no accessory muscle use noted.   - Will give patient a peak meter flow so that she can monitor at home so that we can determine her baseline -Prescribed a 10 day tapering dose of prednisone -I instructed patient that she can use her albuterol inhaler more frequently than twice a day. -She was also instructed to return to clinic if she develops fevers, increasing cough/sputum production or severe shortness of breath. -Refilled Advair and albuterol today

## 2010-10-16 NOTE — Telephone Encounter (Signed)
Pt called with c/o wheezing 2 days ago, today has SOB.  Productive cough. Denies fever. She is using both inhalers with relief, but not feeling well.  Will see today

## 2010-10-16 NOTE — Patient Instructions (Signed)
Start taking Prednisone tapering dose as prescribed You can use albuterol inhaler every 4 hours as needed for shortness of breathe Follow up in 1-2 weeks if symptoms do not get better

## 2010-10-31 ENCOUNTER — Other Ambulatory Visit: Payer: Self-pay | Admitting: Physician Assistant

## 2010-10-31 ENCOUNTER — Encounter (HOSPITAL_BASED_OUTPATIENT_CLINIC_OR_DEPARTMENT_OTHER): Payer: Medicare Other | Admitting: Oncology

## 2010-10-31 DIAGNOSIS — Z5112 Encounter for antineoplastic immunotherapy: Secondary | ICD-10-CM

## 2010-10-31 DIAGNOSIS — C50919 Malignant neoplasm of unspecified site of unspecified female breast: Secondary | ICD-10-CM

## 2010-10-31 LAB — CBC WITH DIFFERENTIAL/PLATELET
BASO%: 0.3 % (ref 0.0–2.0)
Basophils Absolute: 0 10*3/uL (ref 0.0–0.1)
EOS%: 6.1 % (ref 0.0–7.0)
Eosinophils Absolute: 0.4 10*3/uL (ref 0.0–0.5)
HCT: 33.4 % — ABNORMAL LOW (ref 34.8–46.6)
HGB: 11 g/dL — ABNORMAL LOW (ref 11.6–15.9)
LYMPH%: 39.4 % (ref 14.0–49.7)
MCH: 28.5 pg (ref 25.1–34.0)
MCHC: 32.9 g/dL (ref 31.5–36.0)
MCV: 86.5 fL (ref 79.5–101.0)
MONO#: 0.5 10*3/uL (ref 0.1–0.9)
MONO%: 6.7 % (ref 0.0–14.0)
NEUT#: 3.2 10*3/uL (ref 1.5–6.5)
NEUT%: 47.5 % (ref 38.4–76.8)
Platelets: 205 10*3/uL (ref 145–400)
RBC: 3.86 10*6/uL (ref 3.70–5.45)
RDW: 13.5 % (ref 11.2–14.5)
WBC: 6.7 10*3/uL (ref 3.9–10.3)
lymph#: 2.6 10*3/uL (ref 0.9–3.3)

## 2010-10-31 LAB — COMPREHENSIVE METABOLIC PANEL
ALT: 19 U/L (ref 0–35)
AST: 21 U/L (ref 0–37)
Albumin: 4.3 g/dL (ref 3.5–5.2)
Alkaline Phosphatase: 121 U/L — ABNORMAL HIGH (ref 39–117)
BUN: 36 mg/dL — ABNORMAL HIGH (ref 6–23)
CO2: 23 mEq/L (ref 19–32)
Calcium: 9.8 mg/dL (ref 8.4–10.5)
Chloride: 105 mEq/L (ref 96–112)
Creatinine, Ser: 1.4 mg/dL — ABNORMAL HIGH (ref 0.50–1.10)
Glucose, Bld: 114 mg/dL — ABNORMAL HIGH (ref 70–99)
Potassium: 4.1 mEq/L (ref 3.5–5.3)
Sodium: 142 mEq/L (ref 135–145)
Total Bilirubin: 0.3 mg/dL (ref 0.3–1.2)
Total Protein: 6.9 g/dL (ref 6.0–8.3)

## 2010-10-31 LAB — LACTATE DEHYDROGENASE: LDH: 148 U/L (ref 94–250)

## 2010-10-31 LAB — CANCER ANTIGEN 27.29: CA 27.29: 27 U/mL (ref 0–39)

## 2010-11-18 ENCOUNTER — Ambulatory Visit (INDEPENDENT_AMBULATORY_CARE_PROVIDER_SITE_OTHER): Payer: Medicare Other | Admitting: Internal Medicine

## 2010-11-18 ENCOUNTER — Emergency Department (HOSPITAL_COMMUNITY): Payer: Medicare Other

## 2010-11-18 ENCOUNTER — Encounter: Payer: Self-pay | Admitting: Internal Medicine

## 2010-11-18 ENCOUNTER — Emergency Department (HOSPITAL_COMMUNITY)
Admission: EM | Admit: 2010-11-18 | Discharge: 2010-11-18 | Disposition: A | Discharge status: Home / Self Care | Payer: Medicare Other | Attending: Emergency Medicine | Admitting: Emergency Medicine

## 2010-11-18 DIAGNOSIS — M79609 Pain in unspecified limb: Secondary | ICD-10-CM

## 2010-11-18 DIAGNOSIS — I998 Other disorder of circulatory system: Secondary | ICD-10-CM | POA: Insufficient documentation

## 2010-11-18 DIAGNOSIS — E789 Disorder of lipoprotein metabolism, unspecified: Secondary | ICD-10-CM | POA: Insufficient documentation

## 2010-11-18 DIAGNOSIS — M25476 Effusion, unspecified foot: Secondary | ICD-10-CM | POA: Insufficient documentation

## 2010-11-18 DIAGNOSIS — M25676 Stiffness of unspecified foot, not elsewhere classified: Secondary | ICD-10-CM | POA: Insufficient documentation

## 2010-11-18 DIAGNOSIS — S93409A Sprain of unspecified ligament of unspecified ankle, initial encounter: Secondary | ICD-10-CM | POA: Insufficient documentation

## 2010-11-18 DIAGNOSIS — M25673 Stiffness of unspecified ankle, not elsewhere classified: Secondary | ICD-10-CM | POA: Insufficient documentation

## 2010-11-18 DIAGNOSIS — X500XXA Overexertion from strenuous movement or load, initial encounter: Secondary | ICD-10-CM | POA: Insufficient documentation

## 2010-11-18 DIAGNOSIS — J45909 Unspecified asthma, uncomplicated: Secondary | ICD-10-CM | POA: Insufficient documentation

## 2010-11-18 DIAGNOSIS — M79671 Pain in right foot: Secondary | ICD-10-CM

## 2010-11-18 DIAGNOSIS — Z79899 Other long term (current) drug therapy: Secondary | ICD-10-CM | POA: Insufficient documentation

## 2010-11-18 DIAGNOSIS — Z853 Personal history of malignant neoplasm of breast: Secondary | ICD-10-CM | POA: Insufficient documentation

## 2010-11-18 DIAGNOSIS — Y92009 Unspecified place in unspecified non-institutional (private) residence as the place of occurrence of the external cause: Secondary | ICD-10-CM | POA: Insufficient documentation

## 2010-11-18 DIAGNOSIS — M25473 Effusion, unspecified ankle: Secondary | ICD-10-CM | POA: Insufficient documentation

## 2010-11-18 DIAGNOSIS — I10 Essential (primary) hypertension: Secondary | ICD-10-CM | POA: Insufficient documentation

## 2010-11-18 DIAGNOSIS — M25579 Pain in unspecified ankle and joints of unspecified foot: Secondary | ICD-10-CM | POA: Insufficient documentation

## 2010-11-18 NOTE — Progress Notes (Signed)
60 year old woman with past medical history of asthma, hypertension, hyperlipidemia comes to the clinic complaining of pain in the right foot. Started 2 days ago after she missed a step on a staircase and fell on the foot. The foot has been swollen since then. She's been able to bear weight. Has been taking tramadol for pain. Not had any medical care for this before.  Physical exam  BP 136/78  Pulse 103  Temp(Src) 97.3 F (36.3 C) (Oral)  Ht 5\' 8"  (1.727 m)  Wt 234 lb 1.6 oz (106.187 kg)  BMI 35.59 kg/m2  SpO2 98%  General Appearance:    Alert, cooperative, no distress, appears stated age  Head:    Normocephalic, without obvious abnormality, atraumatic  Eyes:    PERRL, conjunctiva/corneas clear, EOM's intact, fundi    benign, both eyes  Ears:    Normal TM's and external ear canals, both ears  Nose:   Nares normal, septum midline, mucosa normal, no drainage    or sinus tenderness  Throat:   Lips, mucosa, and tongue normal; teeth and gums normal  Neck:   Supple, symmetrical, trachea midline, no adenopathy;    thyroid:  no enlargement/tenderness/nodules; no carotid   bruit or JVD  Back:     Symmetric, no curvature, ROM normal, no CVA tenderness  Lungs:     Clear to auscultation bilaterally, respirations unlabored  Chest Wall:    No tenderness or deformity   Heart:    Regular rate and rhythm, S1 and S2 normal, no murmur, rub   or gallop  Breast Exam:    No tenderness, masses, or nipple abnormality  Abdomen:     Soft, non-tender, bowel sounds active all four quadrants,    no masses, no organomegaly  Extremities:   right foot swollen, tender to palpation and reduced range of motion. Left foot swollen but less in comparison to right foot.   Pulses:   2+ and symmetric all extremities  Skin:   Skin color, texture, turgor normal, no rashes or lesions  Lymph nodes:   Cervical, supraclavicular, and axillary nodes normal  Neurologic:   CNII-XII intact, normal strength, sensation and reflexes      throughout   Review of systems  Constitutional: Denies fever, chills, diaphoresis, appetite change and fatigue.  HEENT: Denies photophobia, eye pain, redness, hearing loss, ear pain, congestion, sore throat, rhinorrhea, sneezing, mouth sores, trouble swallowing, neck pain, neck stiffness and tinnitus.   Respiratory: Denies SOB, DOE, cough, chest tightness,  and wheezing.   Cardiovascular: Denies chest pain, palpitations and leg swelling.  Gastrointestinal: Denies nausea, vomiting, abdominal pain, diarrhea, constipation, blood in stool and abdominal distention.  Genitourinary: Denies dysuria, urgency, frequency, hematuria, flank pain and difficulty urinating.  Musculoskeletal: Joint pain in right foot  Skin: Denies pallor, rash and wound.  Neurological: Denies dizziness, seizures, syncope, weakness, light-headedness, numbness and headaches.  Hematological: Denies adenopathy. Easy bruising, personal or family bleeding history  Psychiatric/Behavioral: Denies suicidal ideation, mood changes, confusion, nervousness, sleep disturbance and agitation

## 2010-11-18 NOTE — Assessment & Plan Note (Signed)
Sent to the emergency department for x-ray and further treatment. He says the fracture may need also consult. If does not may need orthotics bandage.

## 2010-11-18 NOTE — Patient Instructions (Signed)
Follow up in 1-2 months

## 2010-11-21 ENCOUNTER — Other Ambulatory Visit: Payer: Self-pay | Admitting: Oncology

## 2010-11-21 ENCOUNTER — Encounter (HOSPITAL_BASED_OUTPATIENT_CLINIC_OR_DEPARTMENT_OTHER): Payer: Medicare Other | Admitting: Oncology

## 2010-11-21 DIAGNOSIS — Z5112 Encounter for antineoplastic immunotherapy: Secondary | ICD-10-CM

## 2010-11-21 DIAGNOSIS — Z5111 Encounter for antineoplastic chemotherapy: Secondary | ICD-10-CM

## 2010-11-21 DIAGNOSIS — Z170 Estrogen receptor positive status [ER+]: Secondary | ICD-10-CM

## 2010-11-21 DIAGNOSIS — C50919 Malignant neoplasm of unspecified site of unspecified female breast: Secondary | ICD-10-CM

## 2010-11-21 LAB — CBC WITH DIFFERENTIAL/PLATELET
BASO%: 0.9 % (ref 0.0–2.0)
Basophils Absolute: 0.1 10*3/uL (ref 0.0–0.1)
EOS%: 5.6 % (ref 0.0–7.0)
Eosinophils Absolute: 0.3 10*3/uL (ref 0.0–0.5)
HCT: 31.1 % — ABNORMAL LOW (ref 34.8–46.6)
HGB: 10.3 g/dL — ABNORMAL LOW (ref 11.6–15.9)
LYMPH%: 46.5 % (ref 14.0–49.7)
MCH: 27.5 pg (ref 25.1–34.0)
MCHC: 33.1 g/dL (ref 31.5–36.0)
MCV: 83.2 fL (ref 79.5–101.0)
MONO#: 0.4 10*3/uL (ref 0.1–0.9)
MONO%: 6.9 % (ref 0.0–14.0)
NEUT#: 2.1 10*3/uL (ref 1.5–6.5)
NEUT%: 40.1 % (ref 38.4–76.8)
Platelets: 204 10*3/uL (ref 145–400)
RBC: 3.74 10*6/uL (ref 3.70–5.45)
RDW: 13.4 % (ref 11.2–14.5)
WBC: 5.4 10*3/uL (ref 3.9–10.3)
lymph#: 2.5 10*3/uL (ref 0.9–3.3)

## 2010-11-21 LAB — COMPREHENSIVE METABOLIC PANEL
ALT: 24 U/L (ref 0–35)
AST: 25 U/L (ref 0–37)
Albumin: 4.4 g/dL (ref 3.5–5.2)
Alkaline Phosphatase: 83 U/L (ref 39–117)
BUN: 26 mg/dL — ABNORMAL HIGH (ref 6–23)
CO2: 26 mEq/L (ref 19–32)
Calcium: 9.7 mg/dL (ref 8.4–10.5)
Chloride: 106 mEq/L (ref 96–112)
Creatinine, Ser: 1.75 mg/dL — ABNORMAL HIGH (ref 0.50–1.10)
Glucose, Bld: 86 mg/dL (ref 70–99)
Potassium: 4.2 mEq/L (ref 3.5–5.3)
Sodium: 143 mEq/L (ref 135–145)
Total Bilirubin: 0.5 mg/dL (ref 0.3–1.2)
Total Protein: 6.7 g/dL (ref 6.0–8.3)

## 2010-12-05 ENCOUNTER — Encounter: Payer: Self-pay | Admitting: Internal Medicine

## 2010-12-05 ENCOUNTER — Ambulatory Visit (INDEPENDENT_AMBULATORY_CARE_PROVIDER_SITE_OTHER): Payer: Medicare Other | Admitting: Internal Medicine

## 2010-12-05 DIAGNOSIS — E119 Type 2 diabetes mellitus without complications: Secondary | ICD-10-CM

## 2010-12-05 DIAGNOSIS — C50919 Malignant neoplasm of unspecified site of unspecified female breast: Secondary | ICD-10-CM

## 2010-12-05 DIAGNOSIS — S335XXA Sprain of ligaments of lumbar spine, initial encounter: Secondary | ICD-10-CM

## 2010-12-05 DIAGNOSIS — I10 Essential (primary) hypertension: Secondary | ICD-10-CM

## 2010-12-05 MED ORDER — TRAMADOL HCL 50 MG PO TABS: 50.0000 mg | ORAL_TABLET | Freq: Four times a day (QID) | ORAL | Status: DC | PRN

## 2010-12-05 NOTE — Progress Notes (Signed)
Subjective:   Patient ID: [name] female   DOB: 09/29/1950 60 y.o.   MRN: [id number]  HPI: [name] is a 60 y.o. woman who presents to clinic today with pain in her left back for the last 7-10 days.  She states that she fell 8/28 and twisted her ankle and landed on her tail bone.  She was seen in the ER and diagnosed with an ankle sprain.  Then a week or so ago she noted some pain in her left back that moves to the side and around to the front. She states that it hurts worse with lying down or sitting for a while.  She denies increase with bending, twisting, walking.  She also denies fevers chills, n/v, urinary or bowel incontinence.  The pain is a dull ache with no sharpness.  She has tried warm water bathes that don't help.  She has been using tramadol to help the pain when she goes to sleep.    She is currently undergoing treatment for invasive ductal carcinoma in her left breast.  She is followed by the [facility].  She states that she is having no problems with therapy currently.    She states that she has been taking her other medications for her diabetes and hypertension as prescribed.  She denies any polyuria, polydipsia, nausea, vomiting, fever, chills, diarrhea, or constipation.   Past Medical History  Diagnosis Date  . Depression     seasonal melancholia  . Hyperlipidemia   . Hypertension   . Arthritis     DJD, lumbar spondylosis  . Myopathy     not statin related, cause unknown   . Breast cancer   . Back pain   . Edema extremities    Current Outpatient Prescriptions  Medication Sig Dispense Refill  . albuterol (PROVENTIL,VENTOLIN) 90 MCG/ACT inhaler Inhale 2 puffs into the lungs every 4 (four) hours as needed.  17 g  6  . aspirin 81 MG tablet Take 81 mg by mouth daily.        . ferrous sulfate 325 (65 FE) MG tablet Take 325 mg by mouth 2 (two) times daily.        . Fluticasone-Salmeterol (ADVAIR DISKUS) 250-50 MCG/DOSE AEPB Inhale 1 puff  into the lungs 2 (two) times daily.  1 each  6  . hydrochlorothiazide 25 MG tablet Take 1 tablet (25 mg total) by mouth 2 (two) times daily.  60 tablet  11  . lisinopril (PRINIVIL,ZESTRIL) 40 MG tablet Take 40 mg by mouth daily.        Marland Kitchen LORazepam (ATIVAN) 1 MG tablet Take 1 mg by mouth daily as needed. For anxiety       . NEXIUM 40 MG capsule Daily      . predniSONE (DELTASONE) 10 MG tablet Take 4 tablets x 2 days, then 3 tablets x 2 days, then 2 tablets x 2 days, then 1 tablets x 4 days  22 tablet  0  . traMADol (ULTRAM) 50 MG tablet Take 1 tablet (50 mg total) by mouth every 6 (six) hours as needed.  60 tablet  3   Family History  Problem Relation Age of Onset  . Cancer Mother 49    Breast cancer, mother  . Heart disease Mother 33    CAD  . Breast cancer Mother   . Cancer Father 63    colon cancer  . Colon cancer Father   . Coronary artery disease Other  History   Social History  . Marital Status: Single    Spouse Name: N/A    Number of Children: N/A  . Years of Education: N/A   Social History Main Topics  . Smoking status: Never Smoker   . Smokeless tobacco: None  . Alcohol Use: No  . Drug Use: None  . Sexually Active: None   Other Topics Concern  . None   Social History Narrative   Single, 2 kidsNever smokedNo alcohol useNo drugsLaid off from [employer] after working there for 14 years.    Review of Systems: Negative except as noted in the HPI.  Objective:  Physical Exam: Filed Vitals:   12/05/10 1517  BP: 149/83  Pulse: 95  Temp: 97.8 F (36.6 C)  TempSrc: Oral  Height: 5\' 8"  (1.727 m)  Weight: 231 lb 6.4 oz (104.962 kg)   Constitutional: Vital signs reviewed.  Patient is a well-developed and well-nourished woman in no acute distress and cooperative with exam. Alert and oriented x3.  Head: Normocephalic and atraumatic Ear: TM normal bilaterally Mouth: no erythema or exudates, MMM Eyes: PERRL, EOMI, conjunctivae normal, No scleral icterus.   Neck: Supple, Trachea midline normal ROM, No JVD, mass, thyromegaly, or carotid bruit present.  Cardiovascular: left sided mastectomy scar without erythema.  Port site clean and dry.  RRR, S1 normal, S2 normal, no MRG, pulses symmetric and intact bilaterally Pulmonary/Chest: CTAB, no wheezes, rales, or rhonchi Abdominal: Soft. Non-tender, non-distended, bowel sounds are normal, no masses, organomegaly, or guarding present.  GU: no CVA tenderness Musculoskeletal: mild point tenderness in the lumbar paraspinous muscles on the left.  ROM limited in bending and twisting due to pain.  No joint deformities, erythema, or stiffness.  ROM is limited in the hip and knee bilaterally in all planes. Hematology: no cervical, inginal, or axillary adenopathy.  Neurological: A&O x3, Strength is normal and symmetric bilaterally, cranial nerve II-XII are grossly intact, no focal motor deficit, sensory intact to light touch bilaterally.  Skin: Warm, dry and intact. No rash, cyanosis, or clubbing.  Psychiatric: Normal mood and affect. speech and behavior is normal. Judgment and thought content normal. Cognition and memory are normal.   Assessment & Plan:

## 2010-12-05 NOTE — Patient Instructions (Addendum)
Back Pain (Lumbosacral Strain) Back pain is one of the most common causes of pain. There are many causes of back pain. Most are not serious conditions.  CAUSES Your backbone (spinal column) is made up of 24 main vertebral bodies, the sacrum, and the coccyx. These are held together by muscles and tough, fibrous tissue (ligaments). Nerve roots pass through the openings between the vertebrae. A sudden move or injury to the back may cause injury to, or pressure on, these nerves. This may result in localized back pain or pain movement (radiation) into the buttocks, down the leg, and into the foot. Sharp, shooting pain from the buttock down the back of the leg (sciatica) is frequently associated with a ruptured (herniated) disc. Pain may be caused by muscle spasm alone. Your caregiver can often find the cause of your pain by the details of your symptoms and an exam. In some cases, you may need tests (such as X-rays). Your caregiver will work with you to decide if any tests are needed based on your specific exam. HOME CARE INSTRUCTIONS  Avoid an underactive lifestyle. Active exercise, as directed by your caregiver, is your greatest weapon against back pain.   Avoid hard physical activities (tennis, racquetball, water-skiing) if you are not in proper physical condition for it. This may aggravate and/or create problems.   If you have a back problem, avoid sports requiring sudden body movements. Swimming and walking are generally safer activities.   Maintain good posture.   Avoid becoming overweight (obese).   Use bed rest for only the most extreme, sudden (acute) episode. Your caregiver will help you determine how much bed rest is necessary.   For acute conditions, you may put ice on the injured area.   Put ice in a plastic bag.   Place a towel between your skin and the bag.   Leave the ice on for 20 minutes at a time, every 2 hours, or as needed.   After you are improved and more active, it may  help to apply heat for 30 minutes before activities.  See your caregiver if you are having pain that lasts longer than expected. Your caregiver can advise appropriate exercises and/or therapy if needed. With conditioning, most back problems can be avoided. SEEK IMMEDIATE MEDICAL CARE IF:  You have numbness, tingling, weakness, or problems with the use of your arms or legs.   You experience severe back pain not relieved with medicines.   There is a change in bowel or bladder control.   You have increasing pain in any area of the body, including your belly (abdomen).   You notice shortness of breath, dizziness, or feel faint.   You feel sick to your stomach (nauseous), are throwing up (vomiting), or become sweaty.   You notice discoloration of your toes or legs, or your feet get very cold.   Your back pain is getting worse.   You have an oral temperature above 101, not controlled by medicine.  MAKE SURE YOU:   Understand these instructions.   Will watch your condition.   Will get help right away if you are not doing well or get worse.  Document Released: 12/17/2004 Document Re-Released: 06/03/2009 [facility] Patient Information 2011 [place].  1. You have a strain in your lower back from the fall.  You can use ice or heat to help it.  Tylenol or your tramadol for the pain.  It should get better slowly over time.  2.  We will make the  referral to physical therapy to help with flexibility and ROM in your back and legs.    3.  Work on walking and staying active.    4.  Continue all your medications as prescribed.  5.  Follow up in 1-2 months to see how you're doing.

## 2010-12-11 ENCOUNTER — Ambulatory Visit (HOSPITAL_COMMUNITY)
Admission: RE | Admit: 2010-12-11 | Discharge: 2010-12-11 | Disposition: A | Discharge status: Home / Self Care | Payer: Medicare Other | Source: Ambulatory Visit | Attending: Internal Medicine | Admitting: Internal Medicine

## 2010-12-11 DIAGNOSIS — R059 Cough, unspecified: Secondary | ICD-10-CM | POA: Insufficient documentation

## 2010-12-11 DIAGNOSIS — C50919 Malignant neoplasm of unspecified site of unspecified female breast: Secondary | ICD-10-CM

## 2010-12-11 DIAGNOSIS — E119 Type 2 diabetes mellitus without complications: Secondary | ICD-10-CM | POA: Insufficient documentation

## 2010-12-11 DIAGNOSIS — I5030 Unspecified diastolic (congestive) heart failure: Secondary | ICD-10-CM | POA: Insufficient documentation

## 2010-12-11 DIAGNOSIS — R0989 Other specified symptoms and signs involving the circulatory and respiratory systems: Secondary | ICD-10-CM | POA: Insufficient documentation

## 2010-12-11 DIAGNOSIS — R0609 Other forms of dyspnea: Secondary | ICD-10-CM | POA: Insufficient documentation

## 2010-12-11 DIAGNOSIS — R05 Cough: Secondary | ICD-10-CM | POA: Insufficient documentation

## 2010-12-11 DIAGNOSIS — J45991 Cough variant asthma: Secondary | ICD-10-CM

## 2010-12-11 DIAGNOSIS — I519 Heart disease, unspecified: Secondary | ICD-10-CM

## 2010-12-11 DIAGNOSIS — R0600 Dyspnea, unspecified: Secondary | ICD-10-CM

## 2010-12-11 DIAGNOSIS — R609 Edema, unspecified: Secondary | ICD-10-CM

## 2010-12-11 MED ORDER — FUROSEMIDE 40 MG PO TABS: 40.0000 mg | ORAL_TABLET | Freq: Every day | ORAL | Status: DC

## 2010-12-11 MED ORDER — POTASSIUM CHLORIDE CRYS ER 20 MEQ PO TBCR: 20.0000 meq | EXTENDED_RELEASE_TABLET | Freq: Every day | ORAL | Status: DC

## 2010-12-11 NOTE — Assessment & Plan Note (Signed)
She has known asthma and seems to be inadequately controlled. Will refer to pulmonary for assistance with pulmonary regimen and possible nebulizer. May also have component of diastolic dysfunction adding to this and hopefully diuresis may help some as well.

## 2010-12-11 NOTE — Assessment & Plan Note (Signed)
Still with some fluid on board. Will stop HCTZ (as this also makes blood sugars worse) and start lasix 40 qd with kcl 20 daily. BMET in 1 week. Watch electrolytes and renal function closely.

## 2010-12-11 NOTE — Assessment & Plan Note (Signed)
Agree with PCP; metformin likely good agent for her. May benefit form checking sugars at home.

## 2010-12-11 NOTE — Patient Instructions (Signed)
Stop HCTZ Start Furosemide 40 mg daily Start Potassium 20 meq daily  Your physician has requested that you have an echocardiogram. Echocardiography is a painless test that uses sound waves to create images of your heart. It provides your doctor with information about the size and shape of your heart and how well your heart's chambers and valves are working. This procedure takes approximately one hour. There are no restrictions for this procedure.  At [facility] on [address]. 9/27 at 11:30  Your physician recommends that you return for lab work in: [place]. 9/27  You have been referred to Pulmonary MD for wheezing  Your physician recommends that you schedule a follow-up appointment in: 2 months.

## 2010-12-11 NOTE — Assessment & Plan Note (Signed)
Stable from a cardiac standpoint. Continue herceptin. Due for repeat echo.

## 2010-12-11 NOTE — Progress Notes (Signed)
  HPI: [name] is a 60 y/o women with h/o obesity, HTN,asthma and HL. Referred by Dr. Humphrey Rolls for management of cardiac status during Herceptin therapy (started May 2012)  Found to have L ductal breast CA. ER/PR +. HER 2 neu was equivocal. s/p bilateral mastectomies in 11/11. Lymph nodes negative.    Echo April 10, 2010 EF 55-60%. Grade 1 diastolic dusfunction. MUGA 2/12 EF 73%  Echo April 9,2012 EF 6-65%  Was admitted in April 2012, with CVA manifested by LUE dyscoordination. CVA confirmed by MRI. Now resolved.   Having problems with wheezing every night but not during day. Uses inhalers but not nebulizers. Continues with mild lower extremity edema. Gets SOB on stairs (chronic for her). Follows BP closely 120-130 range. Continues on Herceptin.      ROS: All systems negative except as listed in HPI, PMH and Problem List.  Past Medical History  Diagnosis Date  . Depression     seasonal melancholia  . Hyperlipidemia   . Hypertension   . Arthritis     DJD, lumbar spondylosis  . Myopathy     not statin related, cause unknown   . Breast cancer   . Back pain   . Edema extremities     Current Outpatient Prescriptions  Medication Sig Dispense Refill  . albuterol (PROVENTIL,VENTOLIN) 90 MCG/ACT inhaler Inhale 2 puffs into the lungs every 4 (four) hours as needed.  17 g  6  . aspirin 81 MG tablet Take 81 mg by mouth daily.        . ferrous sulfate 325 (65 FE) MG tablet Take 325 mg by mouth 2 (two) times daily.        . Fluticasone-Salmeterol (ADVAIR DISKUS) 250-50 MCG/DOSE AEPB Inhale 1 puff into the lungs 2 (two) times daily.  1 each  6  . hydrochlorothiazide 25 MG tablet Take 1 tablet (25 mg total) by mouth 2 (two) times daily.  60 tablet  11  . lisinopril (PRINIVIL,ZESTRIL) 40 MG tablet Take 40 mg by mouth daily.        Marland Kitchen LORazepam (ATIVAN) 1 MG tablet Take 1 mg by mouth daily as needed. For anxiety       . NEXIUM 40 MG capsule Daily      . traMADol (ULTRAM) 50 MG tablet Take 1 tablet  (50 mg total) by mouth every 6 (six) hours as needed.  60 tablet  3     PHYSICAL EXAM: Filed Vitals:   12/11/10 1218  BP: 146/84  Pulse: 85   General:  Well appearing. No resp difficulty HEENT: normal Neck: supple. JVP flat. Carotids 2+ bilaterally; no bruits. No lymphadenopathy or thryomegaly appreciated. Cor: PMI normal. Regular rate & rhythm. No rubs, gallops or murmurs. S/p B mastectomies Lungs: clear Abdomen: soft, nontender, nondistended. No hepatosplenomegaly. No bruits or masses. Good bowel sounds. Extremities: no cyanosis, clubbing, rash. Tr edema Neuro: alert & orientedx3, cranial nerves grossly intact. Moves all 4 extremities w/o difficulty. Affect pleasant.    ASSESSMENT & PLAN:

## 2010-12-12 ENCOUNTER — Other Ambulatory Visit: Payer: Self-pay | Admitting: Physician Assistant

## 2010-12-12 ENCOUNTER — Encounter (HOSPITAL_BASED_OUTPATIENT_CLINIC_OR_DEPARTMENT_OTHER): Payer: Medicare Other | Admitting: Oncology

## 2010-12-12 DIAGNOSIS — Z170 Estrogen receptor positive status [ER+]: Secondary | ICD-10-CM

## 2010-12-12 DIAGNOSIS — Z5111 Encounter for antineoplastic chemotherapy: Secondary | ICD-10-CM

## 2010-12-12 DIAGNOSIS — Z5112 Encounter for antineoplastic immunotherapy: Secondary | ICD-10-CM

## 2010-12-12 DIAGNOSIS — C50919 Malignant neoplasm of unspecified site of unspecified female breast: Secondary | ICD-10-CM

## 2010-12-12 LAB — CBC WITH DIFFERENTIAL/PLATELET
BASO%: 1.3 % (ref 0.0–2.0)
Basophils Absolute: 0.1 10*3/uL (ref 0.0–0.1)
EOS%: 27 % — ABNORMAL HIGH (ref 0.0–7.0)
Eosinophils Absolute: 2 10*3/uL — ABNORMAL HIGH (ref 0.0–0.5)
HCT: 32.1 % — ABNORMAL LOW (ref 34.8–46.6)
HGB: 10.8 g/dL — ABNORMAL LOW (ref 11.6–15.9)
LYMPH%: 26.8 % (ref 14.0–49.7)
MCH: 28 pg (ref 25.1–34.0)
MCHC: 33.6 g/dL (ref 31.5–36.0)
MCV: 83.2 fL (ref 79.5–101.0)
MONO#: 0.4 10*3/uL (ref 0.1–0.9)
MONO%: 5.8 % (ref 0.0–14.0)
NEUT#: 3 10*3/uL (ref 1.5–6.5)
NEUT%: 39.1 % (ref 38.4–76.8)
Platelets: 206 10*3/uL (ref 145–400)
RBC: 3.86 10*6/uL (ref 3.70–5.45)
RDW: 14.1 % (ref 11.2–14.5)
WBC: 7.6 10*3/uL (ref 3.9–10.3)
lymph#: 2 10*3/uL (ref 0.9–3.3)
nRBC: 0 % (ref 0–0)

## 2010-12-12 LAB — COMPREHENSIVE METABOLIC PANEL
ALT: 23 U/L (ref 0–35)
AST: 23 U/L (ref 0–37)
Albumin: 4.3 g/dL (ref 3.5–5.2)
Alkaline Phosphatase: 96 U/L (ref 39–117)
BUN: 28 mg/dL — ABNORMAL HIGH (ref 6–23)
CO2: 25 mEq/L (ref 19–32)
Calcium: 9.7 mg/dL (ref 8.4–10.5)
Chloride: 105 mEq/L (ref 96–112)
Creatinine, Ser: 1.64 mg/dL — ABNORMAL HIGH (ref 0.50–1.10)
Glucose, Bld: 113 mg/dL — ABNORMAL HIGH (ref 70–99)
Potassium: 4.4 mEq/L (ref 3.5–5.3)
Sodium: 142 mEq/L (ref 135–145)
Total Bilirubin: 0.4 mg/dL (ref 0.3–1.2)
Total Protein: 7.2 g/dL (ref 6.0–8.3)

## 2010-12-12 LAB — LACTATE DEHYDROGENASE: LDH: 167 U/L (ref 94–250)

## 2010-12-12 LAB — CANCER ANTIGEN 27.29: CA 27.29: 32 U/mL (ref 0–39)

## 2010-12-12 NOTE — Assessment & Plan Note (Addendum)
BP Readings from Last 3 Encounters:  12/11/10 146/84  12/05/10 149/83  11/18/10 136/78   Blood pressure continues to be mildly elevated.  We will continue to monitor since she is in acute pain today and undergoing chemotherapy.  If she remains elevated we will adjust her drug regimen.

## 2010-12-12 NOTE — Assessment & Plan Note (Signed)
She likely has a low back strain from the fall.  She has limited ROM in her hips, back, and knees that predisposes her to back problems.  We will use ice and pain medication for the acute low back problem and refer her to PT to help with flexibility and strength in her back and legs to help keep her from having more problems with her back.

## 2010-12-12 NOTE — Assessment & Plan Note (Signed)
Lab Results  Component Value Date   HGBA1C  Value: 6.7  06/27/2010   Lab Results  Component Value Date   MICROALBUR 1.60 05/04/2006   LDLCALC  Value: 106        Total Cholesterol/HDL:CHD Risk Coronary Heart Disease Risk Table                     Men   Women  1/2 Average Risk   3.4   3.3  Average Risk       5.0   4.4  2 X Average Risk   9.6   7.1  3 X Average Risk  23.4   11.0        Use the calculated Patient Ratio above and the CHD Risk Table to determine the patient's CHD Risk.        ATP III CLASSIFICATION (LDL):  <100     mg/dL   Optimal  100-129  mg/dL   Near or Above                    Optimal  130-159  mg/dL   Borderline  160-189  mg/dL   High  >190     mg/dL   Very High* 06/28/2010   CREATININE 1.75* 11/21/2010   CREATININE 1.75* 11/21/2010   A1C is diagnostic for diabetes.  We will continue metformin and continue to follow her blood sugars.

## 2010-12-17 ENCOUNTER — Ambulatory Visit (INDEPENDENT_AMBULATORY_CARE_PROVIDER_SITE_OTHER): Payer: Medicare Other | Admitting: Pulmonary Disease

## 2010-12-17 ENCOUNTER — Encounter: Payer: Self-pay | Admitting: Pulmonary Disease

## 2010-12-17 ENCOUNTER — Institutional Professional Consult (permissible substitution): Payer: Medicare Other | Admitting: Pulmonary Disease

## 2010-12-17 VITALS — BP 150/90 | HR 79 | Temp 98.7°F | Ht 68.0 in | Wt 233.6 lb

## 2010-12-17 DIAGNOSIS — I10 Essential (primary) hypertension: Secondary | ICD-10-CM

## 2010-12-17 DIAGNOSIS — J45991 Cough variant asthma: Secondary | ICD-10-CM

## 2010-12-17 NOTE — Progress Notes (Signed)
  Subjective:    Patient ID: [name], female    DOB: 10/06/1950, 60 y.o.   MRN: [id number]  HPI 60/F, never smoker,  referred for evaluation of cough variant asthma She reports wheezing when lying down especially in spring & fall for the last 3 yrs when she was given a diagnosis of asthma. She takes advair & claims compliance during this season. Dyspnea is present on lcimbing stairs. Breast cancer was recently diagnosed (Dr Humphrey Rolls) & she is on chemotherapy. Grade 1 diastolic dysfunction was noted by Dr Haroldine Laws on echo. She reports pedal edema & has been taking lasix daily. She has been on lisinopril for about 2 yrs. CXR 1/12 no infiltrates, RT portocath Reviewed spirometry 9/09 wnl & today -no obstruction. Methacholine challenge 9/09 drop in smaller airways    Review of Systems  Constitutional: Negative for fever and unexpected weight change.  HENT: Positive for congestion and sneezing. Negative for ear pain, nosebleeds, sore throat, rhinorrhea, trouble swallowing, dental problem, postnasal drip and sinus pressure.   Eyes: Negative for redness and itching.  Respiratory: Positive for shortness of breath. Negative for cough, chest tightness and wheezing.   Cardiovascular: Positive for leg swelling. Negative for palpitations.  Gastrointestinal: Negative for nausea and vomiting.  Genitourinary: Negative for dysuria.  Musculoskeletal: Positive for joint swelling.  Skin: Negative for rash.  Neurological: Negative for headaches.  Hematological: Does not bruise/bleed easily.  Psychiatric/Behavioral: Negative for dysphoric mood. The patient is nervous/anxious.        Objective:   Physical Exam  Gen. Pleasant, well-nourished, in no distress, normal affect ENT - no lesions, no post nasal drip, class 2 airway Neck: No JVD, no thyromegaly, no carotid bruits Lungs: no use of accessory muscles, no dullness to percussion, clear without rales or rhonchi  Cardiovascular: Rhythm regular,  heart sounds  normal, no murmurs or gallops, no peripheral edema Abdomen: soft and non-tender, no hepatosplenomegaly, BS normal. Musculoskeletal: No deformities, no cyanosis or clubbing Neuro:  alert, non focal       Assessment & Plan:

## 2010-12-17 NOTE — Patient Instructions (Addendum)
STOP taking lisinopril - this can sometimes cause cough & wheezing Use benicar 40 once daily instead Stay on advair twice daily  Use albuterol 2 puffs for rescue as needed Start singulair 1 tab daily- this will work for both allergies & asthma

## 2010-12-17 NOTE — Assessment & Plan Note (Signed)
PFTs 09/09 with FEF 25-75% decrease by over 25% with methacholine - s/o bronchial hyperreactivity - however hesitant to label this as asthma given onset 3 yrs ago. Triggers ? Allergies vs sinus disease or GERD Add singulair for now

## 2010-12-17 NOTE — Assessment & Plan Note (Signed)
Substitiute benicar for lisinopril - she will monitor BP

## 2010-12-18 ENCOUNTER — Ambulatory Visit (INDEPENDENT_AMBULATORY_CARE_PROVIDER_SITE_OTHER): Payer: Medicare Other | Admitting: *Deleted

## 2010-12-18 ENCOUNTER — Telehealth (INDEPENDENT_AMBULATORY_CARE_PROVIDER_SITE_OTHER): Payer: Self-pay | Admitting: General Surgery

## 2010-12-18 ENCOUNTER — Ambulatory Visit (HOSPITAL_COMMUNITY): Payer: Medicare Other | Attending: Cardiology

## 2010-12-18 DIAGNOSIS — R0609 Other forms of dyspnea: Secondary | ICD-10-CM

## 2010-12-18 DIAGNOSIS — M7989 Other specified soft tissue disorders: Secondary | ICD-10-CM | POA: Insufficient documentation

## 2010-12-18 DIAGNOSIS — I428 Other cardiomyopathies: Secondary | ICD-10-CM | POA: Insufficient documentation

## 2010-12-18 DIAGNOSIS — C50919 Malignant neoplasm of unspecified site of unspecified female breast: Secondary | ICD-10-CM

## 2010-12-18 DIAGNOSIS — R609 Edema, unspecified: Secondary | ICD-10-CM

## 2010-12-18 DIAGNOSIS — I059 Rheumatic mitral valve disease, unspecified: Secondary | ICD-10-CM | POA: Insufficient documentation

## 2010-12-18 DIAGNOSIS — R0989 Other specified symptoms and signs involving the circulatory and respiratory systems: Secondary | ICD-10-CM

## 2010-12-18 DIAGNOSIS — I519 Heart disease, unspecified: Secondary | ICD-10-CM

## 2010-12-18 DIAGNOSIS — E119 Type 2 diabetes mellitus without complications: Secondary | ICD-10-CM | POA: Insufficient documentation

## 2010-12-18 DIAGNOSIS — R0600 Dyspnea, unspecified: Secondary | ICD-10-CM

## 2010-12-18 DIAGNOSIS — J45909 Unspecified asthma, uncomplicated: Secondary | ICD-10-CM | POA: Insufficient documentation

## 2010-12-18 NOTE — Telephone Encounter (Signed)
Pt called stating the area of port-a-cath was sore. Pt stated it was inserted 02/04/10 and was used last week Friday on 12/12/10. Pt indicated the area is not red or inflamed, with no discharge or drainage and the port-a-cath was not used in a while. Advised pt to take tylenol and use a compress. Advised if the area does become red/inflamed or has discharge to call us.

## 2010-12-19 ENCOUNTER — Other Ambulatory Visit (INDEPENDENT_AMBULATORY_CARE_PROVIDER_SITE_OTHER): Payer: Self-pay | Admitting: General Surgery

## 2010-12-19 ENCOUNTER — Ambulatory Visit
Admission: RE | Admit: 2010-12-19 | Discharge: 2010-12-19 | Disposition: A | Discharge status: Home / Self Care | Payer: Medicare Other | Source: Ambulatory Visit | Attending: General Surgery | Admitting: General Surgery

## 2010-12-19 DIAGNOSIS — C50919 Malignant neoplasm of unspecified site of unspecified female breast: Secondary | ICD-10-CM

## 2010-12-19 LAB — BASIC METABOLIC PANEL
BUN: 28 mg/dL — ABNORMAL HIGH (ref 6–23)
CO2: 27 mEq/L (ref 19–32)
Calcium: 9.3 mg/dL (ref 8.4–10.5)
Chloride: 110 mEq/L (ref 96–112)
Creatinine, Ser: 1.7 mg/dL — ABNORMAL HIGH (ref 0.4–1.2)
GFR: 40.26 mL/min — ABNORMAL LOW (ref >60.00)
Glucose, Bld: 90 mg/dL (ref 70–99)
Potassium: 4.9 mEq/L (ref 3.5–5.1)
Sodium: 144 mEq/L (ref 135–145)

## 2010-12-19 LAB — BRAIN NATRIURETIC PEPTIDE: Pro B Natriuretic peptide (BNP): 29 pg/mL (ref 0.0–100.0)

## 2010-12-20 ENCOUNTER — Encounter: Payer: Self-pay | Admitting: Internal Medicine

## 2010-12-20 ENCOUNTER — Emergency Department (HOSPITAL_COMMUNITY): Payer: Medicare Other

## 2010-12-20 ENCOUNTER — Inpatient Hospital Stay (HOSPITAL_COMMUNITY)
Admission: EM | Admit: 2010-12-20 | Discharge: 2010-12-24 | DRG: 315 | Disposition: A | Discharge status: Home / Self Care | Payer: Medicare Other | Attending: Infectious Diseases | Admitting: Infectious Diseases

## 2010-12-20 DIAGNOSIS — Z8673 Personal history of transient ischemic attack (TIA), and cerebral infarction without residual deficits: Secondary | ICD-10-CM

## 2010-12-20 DIAGNOSIS — F3289 Other specified depressive episodes: Secondary | ICD-10-CM | POA: Diagnosis present

## 2010-12-20 DIAGNOSIS — D573 Sickle-cell trait: Secondary | ICD-10-CM | POA: Diagnosis present

## 2010-12-20 DIAGNOSIS — T82898A Other specified complication of vascular prosthetic devices, implants and grafts, initial encounter: Principal | ICD-10-CM | POA: Diagnosis present

## 2010-12-20 DIAGNOSIS — I10 Essential (primary) hypertension: Secondary | ICD-10-CM | POA: Diagnosis present

## 2010-12-20 DIAGNOSIS — F411 Generalized anxiety disorder: Secondary | ICD-10-CM | POA: Diagnosis present

## 2010-12-20 DIAGNOSIS — I82C19 Acute embolism and thrombosis of unspecified internal jugular vein: Secondary | ICD-10-CM

## 2010-12-20 DIAGNOSIS — J45909 Unspecified asthma, uncomplicated: Secondary | ICD-10-CM | POA: Diagnosis present

## 2010-12-20 DIAGNOSIS — Z9010 Acquired absence of unspecified breast and nipple: Secondary | ICD-10-CM

## 2010-12-20 DIAGNOSIS — F329 Major depressive disorder, single episode, unspecified: Secondary | ICD-10-CM | POA: Diagnosis present

## 2010-12-20 DIAGNOSIS — Z7901 Long term (current) use of anticoagulants: Secondary | ICD-10-CM

## 2010-12-20 DIAGNOSIS — E119 Type 2 diabetes mellitus without complications: Secondary | ICD-10-CM | POA: Diagnosis present

## 2010-12-20 DIAGNOSIS — C50919 Malignant neoplasm of unspecified site of unspecified female breast: Secondary | ICD-10-CM | POA: Diagnosis present

## 2010-12-20 DIAGNOSIS — Z803 Family history of malignant neoplasm of breast: Secondary | ICD-10-CM

## 2010-12-20 DIAGNOSIS — Z888 Allergy status to other drugs, medicaments and biological substances status: Secondary | ICD-10-CM

## 2010-12-20 LAB — DIFFERENTIAL
Basophils Absolute: 0 10*3/uL (ref 0.0–0.1)
Basophils Relative: 0 % (ref 0–1)
Eosinophils Absolute: 0.7 10*3/uL (ref 0.0–0.7)
Eosinophils Relative: 8 % — ABNORMAL HIGH (ref 0–5)
Lymphocytes Relative: 28 % (ref 12–46)
Lymphs Abs: 2.5 10*3/uL (ref 0.7–4.0)
Monocytes Absolute: 0.6 10*3/uL (ref 0.1–1.0)
Monocytes Relative: 7 % (ref 3–12)
Neutro Abs: 5.2 10*3/uL (ref 1.7–7.7)
Neutrophils Relative %: 57 % (ref 43–77)

## 2010-12-20 LAB — CBC
HCT: 29.8 % — ABNORMAL LOW (ref 36.0–46.0)
Hemoglobin: 10.1 g/dL — ABNORMAL LOW (ref 12.0–15.0)
MCH: 28.5 pg (ref 26.0–34.0)
MCHC: 33.9 g/dL (ref 30.0–36.0)
MCV: 83.9 fL (ref 78.0–100.0)
Platelets: 192 10*3/uL (ref 150–400)
RBC: 3.55 MIL/uL — ABNORMAL LOW (ref 3.87–5.11)
RDW: 14.1 % (ref 11.5–15.5)
WBC: 9.1 10*3/uL (ref 4.0–10.5)

## 2010-12-20 LAB — BASIC METABOLIC PANEL
BUN: 17 mg/dL (ref 6–23)
CO2: 30 mEq/L (ref 19–32)
Calcium: 9.6 mg/dL (ref 8.4–10.5)
Chloride: 102 mEq/L (ref 96–112)
Creatinine, Ser: 1.37 mg/dL — ABNORMAL HIGH (ref 0.50–1.10)
GFR calc Af Amer: 47 mL/min — ABNORMAL LOW (ref >60)
GFR calc non Af Amer: 39 mL/min — ABNORMAL LOW (ref >60)
Glucose, Bld: 100 mg/dL — ABNORMAL HIGH (ref 70–99)
Potassium: 3.8 mEq/L (ref 3.5–5.1)
Sodium: 139 mEq/L (ref 135–145)

## 2010-12-20 LAB — PROTIME-INR
INR: 1.02 (ref 0.00–1.49)
Prothrombin Time: 13.6 seconds (ref 11.6–15.2)

## 2010-12-20 MED ORDER — IOHEXOL 300 MG/ML  SOLN
75.0000 mL | Freq: Once | INTRAMUSCULAR | Status: AC | PRN
  Administered 2010-12-20: 75 mL via INTRAVENOUS

## 2010-12-20 NOTE — H&P (Signed)
Hospital Admission Note Date: 12/20/2010  Patient name: [name] Medical record number: [id number] Date of birth: [birth year] Age: 60 y.o. Gender: female PCP: Lester Searles, MD  Medical Service:  Attending physician: Dr. Rhea Pink Resident (R2): Dr. Pedro Earls  Pager: [phone] Resident (R1): Dr. Jannette Fogo   Pager: [phone]  Chief Complaint: Right neck soreness  History of Present Illness: Patient is a 60 year old woman with PMH of Breast Cancer (treated with chemotherapy, right IJ cath, last treatment 12/12/10), HTN, & asthma p/w right neck soreness for 2-3 days prior to admission.  She initially thought pain was due to sinus drainage because she was also sneezing.  She describes the pain as soreness, and is worse with movement of her neck and endorses some pain with swallowing, though reports eating well.  She does not note any alleviating factors, but she did call her surgeon's office ([facility]) on 12/18/10 with c/o port-a-cath site being sore.  At that time, she indicated the area is not red or inflamed, with no discharge or drainage and was instructed to take tylenol and use a compress.  She used tramadol instead of tylenol, as well as a warm compress, but got no relief.    She reports that her cath was inserted on 02/04/10, and was scheduled to be removed in mid January 2013.  She receives chemotherapy once every 3 weeks, and is next scheduled for Jan 01, 2011.  She denies pain or edema of her right arm.     Pt stated it was inserted 02/04/10 and was used last week Friday on 12/12/10. Ptand the port-a-cath was not used in a while. Advised pt to take tylenol and use a compress. Advised if the area does become red/inflamed or has discharge to call us.  She also reports recent medication changes.  HCTZ & Lisinopril were discontinued & Lasix, Potassium & Benecar were started.    Meds: Current Outpatient Prescriptions  Medication Sig Dispense Refill  . albuterol  (PROVENTIL,VENTOLIN) 90 MCG/ACT inhaler Inhale 2 puffs into the lungs every 4 (four) hours as needed.  17 g  6  . aspirin 81 MG tablet Take 81 mg by mouth daily.        . ferrous sulfate 325 (65 FE) MG tablet Take 325 mg by mouth 2 (two) times daily.       . Fluticasone-Salmeterol (ADVAIR DISKUS) 250-50 MCG/DOSE AEPB Inhale 1 puff into the lungs 2 (two) times daily.  1 each  6  . furosemide (LASIX) 40 MG tablet Take 1 tablet (40 mg total) by mouth daily.  30 tablet  6  . letrozole (FEMARA) 2.5 MG tablet Take 1 tablet by mouth Daily.      . Benicar 40 MG tablet Take 40 mg by mouth daily.        Marland Kitchen LORazepam (ATIVAN) 1 MG tablet Take 1 mg by mouth daily as needed. For anxiety       . Multiple Vitamin (MULTIVITAMIN) tablet Take 1 tablet by mouth daily.        Marland Kitchen NEXIUM 40 MG capsule Daily      . potassium chloride SA (KLOR-CON M20) 20 MEQ tablet Take 1 tablet (20 mEq total) by mouth daily.  30 tablet  6  . traMADol (ULTRAM) 50 MG tablet Take 1 tablet (50 mg total) by mouth every 6 (six) hours as needed.  60 tablet  3   Facility-Administered Medications Ordered in Other Visits  Medication Dose Route Frequency Provider Last Rate Last Dose  .  iohexol (OMNIPAQUE) 300 MG/ML injection 75 mL  75 mL Intravenous Once PRN Medication Radiologist   75 mL at 12/20/10 2050    Allergies: Atorvastatin and Rosuvastatin  Past Medical History  Diagnosis Date  . Depression     seasonal melancholia  . Hyperlipidemia   . Hypertension   . Arthritis     DJD, lumbar spondylosis  . Myopathy     not statin related, cause unknown   . Breast cancer   . Back pain   . Edema extremities    Past Surgical History  Procedure Date  . Abdominal hysterectomy     for fibroids   . Laminectomy     C5-6 Dr. Louanne Skye 2005   . Oophorectomy   . Mastectomy     bilateral    Family History  Problem Relation Age of Onset  . Cancer Mother 43    Breast cancer, mother  . Heart disease Mother 45    CAD  . Breast cancer Mother    . Cancer Father 32    colon cancer  . Colon cancer Father   . Coronary artery disease Other    History   Social History  . Marital Status: Single    Spouse Name: N/A    Number of Children: N/A  . Years of Education: N/A   Social History Main Topics  . Smoking status: Never Smoker   . Smokeless tobacco: Never Used  . Alcohol Use: No  . Drug Use: Not on file  . Sexually Active: Not on file   Social History Narrative   Single, 2 kidsNever smokedNo alcohol useNo drugsLaid off from [employer] after working there for 14 years.     Review of Systems: General: no fevers, changes in weight, changes in appetite, endorses chills the morning prior to admission Skin: no rash HEENT: no blurry vision, hearing changes, sore throat Pulm: no new dyspnea, coughing, wheezing CV: no chest pain, palpitations, shortness of breath Abd: no abdominal pain, nausea/vomiting, diarrhea/constipation GU: no dysuria, hematuria, polyuria Ext: no new arthralgias, myalgias Neuro: no weakness, numbness, or tingling  Physical Exam: Vitals: T: 99.7  HR: 91   BP: 125/79   RR:18   O2 saturation: 98% RA  General: resting in bed, no acute distress, well developed, obese woman HEENT: PERRL, EOMI, no scleral icterus, right side of neck tender to palpation in area of RIJ/R SCM Chest: site of right port-a-cath clean, no purulent/bloody drainage  Cardiac: RRR, no rubs, murmurs or gallops Pulm: clear to auscultation bilaterally, moving normal volumes of air Abd: soft, nontender, nondistended, obese, BS present Ext: warm and well perfused, b/l pitting edema Neuro: alert and oriented X3, cranial nerves II-XII grossly intact, strength and sensation to light touch equal in bilateral upper and lower extremities  Lab results: Basic Metabolic Panel:  Basename 12/20/10 1828 12/18/10 1110  NA 139 144  K 3.8 4.9  CL 102 110  CO2 30 27  GLUCOSE 100* 90  BUN 17 28*  CREATININE 1.37* 1.7*  CALCIUM 9.6 9.3    MG -- --  PHOS -- --   CBC:  Basename 12/20/10 1828  WBC 9.1  NEUTROABS 5.2  HGB 10.1*  HCT 29.8*  MCV 83.9  PLT 192   BNP:  Basename 12/18/10 1110  POCBNP 29.0   Imaging results:  Dg Chest 1 View  12/19/2010  *RADIOLOGY REPORT*  Clinical Data: Evaluate Port-A-Cath placement, pain in the right side of the neck  CHEST - 1 VIEW  Comparison: Portable chest x-ray of 04/01/2010  Findings: A right-sided power port Port-A-Cath is present with the tip in the upper SVC.  This appears unchanged in position.  The lungs are clear.  No pneumothorax is seen.  The heart is within normal limits in size.  A lower anterior cervical spine fusion plate is present.  IMPRESSION:  1.  No active lung disease. 2.  No change in position of Port-A-Cath tip in upper SVC.  Original Report Authenticated By: Joretta Bachelor, M.D.   Ct Head Wo Contrast  12/20/2010  *RADIOLOGY REPORT*  Clinical Data: 60 year old female with headache, swelling.  History of breast cancer.  CT HEAD WITHOUT CONTRAST  Technique:  Contiguous axial images were obtained from the base of the skull through the vertex without contrast.  Comparison: Brain MRI without contrast 06/27/2010.  Findings: Disconjugate gaze, otherwise negative orbit soft tissues. Visualized scalp soft tissues are within normal limits.  Ethmoid sinus mucosal thickening and bubbly opacity in the left sphenoid are new.  Other visualized paranasal sinuses and mastoids are clear.  No acute osseous abnormality identified.  Stable dilated perivascular space at the inferior left basal ganglia. Cerebral volume is within normal limits for age.  No midline shift, ventriculomegaly, mass effect, evidence of mass lesion, intracranial hemorrhage or evidence of cortically based acute infarction.  Gray-white matter differentiation is within normal limits throughout the brain.  No suspicious intracranial vascular hyperdensity.  IMPRESSION: 1.  Stable, negative noncontrast CT appearance of the  brain. Early metastatic disease to the brain cannot be excluded in the absence of intravenous contrast. 2.  New ethmoid and sphenoid sinus inflammatory changes could reflect acute sinusitis in the appropriate clinical.  Original Report Authenticated By: Randall An, M.D.   Ct Soft Tissue Neck W Contrast  12/20/2010  *RADIOLOGY REPORT*  Clinical Data: 60 year old female with headache, pain, swelling down the right side of the neck.  History of breast cancer.  CT NECK WITH CONTRAST  Technique:  Multidetector CT imaging of the neck was performed with intravenous contrast.  Contrast: 21mL OMNIPAQUE IOHEXOL 300 MG/ML IV SOLN  Comparison: Head CT 12/20/2010.  Findings: Fluid in the retropharyngeal space does not appear organized.  Extensive stranding in the right neck with its epicenter at the carotid space.  Abrupt loss of enhancement of the right internal jugular vein at the C4 level.  The right common carotid appears to remain patent.  Right chest Port-A-Cath with right IJ approach catheter.  The catheter enters the vein within 3 cm of the highest level of thrombosis and extends caudally.  The right subclavian, bilateral innominate veins and visualized SVC remain enhancing.  Reactive type right level II and level III lymph nodes measure up to 12 mm in short axis.  There is mild mass effect on the pharynx and larynx.  No involvement of the parapharyngeal or sublingual spaces.  Right posterior submandibular gland abutted by changes but not clearly involved.  Negative right parotid.  Other visualized major vascular structures are enhancing.  Atelectasis in the lung apices. No left cervical lymphadenopathy. Negative visualized brain parenchyma and orbits.  Mild paranasal sinus inflammatory changes. No acute osseous abnormality identified.  Prior cervical ACDF with no adverse features.  IMPRESSION: 1.  Acute thrombosis of the right internal jugular vein. Thrombosed segment extends from just below the angle of the  mandible to the confluence with the right subclavian vein. Secondary inflammatory changes surrounding the right carotid space with reactive lymph nodes and small retropharyngeal effusion. 2.  Right subclavian vein, innominate veins, and the visualized SVC remain patent.  Critical Value/emergent results were called by telephone at the time of interpretation on 12/20/2010  at 2141 hours  to  Dr. Davonna Belling, who verbally acknowledged these results.  Original Report Authenticated By: Randall An, M.D.   Assessment & Plan by Problem:  # Right Neck Tenderness: Secondary to acute thrombosis of right IJ vein as shown by CT scan, which is likely secondary to right sided port-a-cath. Plan: -Surgery to remove right sided port-a-cath tomorrow (12/21/10), NPO after midnight -Heparinize until 6am, then hold anti-coagulation for surgery -Plan to anticoagulate x3 months after surgery -Contact Dr. Chancy Milroy (Oncologist) to inform of port-a-cath removal, patient was supposed to receive chemotherapy until mid January 2013 -Vicodin for pain management -AM Labs: CBC, BMET to monitor heparin therapy and monitor renal function   #Breast Cancer: Will continue Letrozole & will contact Dr. Chancy Milroy regarding hospitalization  #HTN: Will continue Benicar & Lasix, goal <130/80  #Diabetes Mellitus: A1c in April 2012 = 6.7, will treat with SSI during hospitalization, seems to be diet controlled out patient; CBG QACHS  #Asthma: Continue Advair   #Anxiety/Insomnia: Ativan PRN during hospitalization (continue outpatient dose)   R2______________________________ (Dr. Ezequiel Kayser, [phone])   R1________________________________ (Dr. Pryor Ochoa, [phone])  ATTENDING: I performed and/or observed a history and physical examination of the patient.  I discussed the case with the residents as noted and reviewed the residents' notes.  I agree with the findings and plan--please refer to the attending physician note for more  details.  Signature________________________________  Printed Name_____________________________

## 2010-12-21 ENCOUNTER — Inpatient Hospital Stay (HOSPITAL_COMMUNITY): Payer: Medicare Other

## 2010-12-21 ENCOUNTER — Other Ambulatory Visit: Payer: Self-pay | Admitting: Internal Medicine

## 2010-12-21 LAB — CBC
HCT: 26.9 % — ABNORMAL LOW (ref 36.0–46.0)
HCT: 25.4 % — ABNORMAL LOW (ref 36.0–46.0)
Hemoglobin: 8.9 g/dL — ABNORMAL LOW (ref 12.0–15.0)
Hemoglobin: 8.6 g/dL — ABNORMAL LOW (ref 12.0–15.0)
MCH: 28 pg (ref 26.0–34.0)
MCH: 28.5 pg (ref 26.0–34.0)
MCHC: 33.1 g/dL (ref 30.0–36.0)
MCHC: 33.9 g/dL (ref 30.0–36.0)
MCV: 84.6 fL (ref 78.0–100.0)
MCV: 84.1 fL (ref 78.0–100.0)
Platelets: 195 10*3/uL (ref 150–400)
Platelets: 185 10*3/uL (ref 150–400)
RBC: 3.18 MIL/uL — ABNORMAL LOW (ref 3.87–5.11)
RBC: 3.02 MIL/uL — ABNORMAL LOW (ref 3.87–5.11)
RDW: 14 % (ref 11.5–15.5)
RDW: 13.9 % (ref 11.5–15.5)
WBC: 9.4 10*3/uL (ref 4.0–10.5)
WBC: 8.4 10*3/uL (ref 4.0–10.5)

## 2010-12-21 LAB — GLUCOSE, CAPILLARY
Glucose-Capillary: 111 mg/dL — ABNORMAL HIGH (ref 70–99)
Glucose-Capillary: 117 mg/dL — ABNORMAL HIGH (ref 70–99)
Glucose-Capillary: 85 mg/dL (ref 70–99)
Glucose-Capillary: 105 mg/dL — ABNORMAL HIGH (ref 70–99)

## 2010-12-21 LAB — BASIC METABOLIC PANEL
BUN: 15 mg/dL (ref 6–23)
CO2: 30 mEq/L (ref 19–32)
Calcium: 9.1 mg/dL (ref 8.4–10.5)
Chloride: 105 mEq/L (ref 96–112)
Creatinine, Ser: 1.36 mg/dL — ABNORMAL HIGH (ref 0.50–1.10)
GFR calc Af Amer: 48 mL/min — ABNORMAL LOW (ref >60)
GFR calc non Af Amer: 40 mL/min — ABNORMAL LOW (ref >60)
Glucose, Bld: 127 mg/dL — ABNORMAL HIGH (ref 70–99)
Potassium: 4.2 mEq/L (ref 3.5–5.1)
Sodium: 141 mEq/L (ref 135–145)

## 2010-12-21 LAB — HEPARIN LEVEL (UNFRACTIONATED)
Heparin Unfractionated: 0.25 IU/mL — ABNORMAL LOW (ref 0.30–0.70)
Heparin Unfractionated: 0.18 IU/mL — ABNORMAL LOW (ref 0.30–0.70)

## 2010-12-21 LAB — MRSA PCR SCREENING: MRSA by PCR: NEGATIVE

## 2010-12-22 ENCOUNTER — Other Ambulatory Visit (INDEPENDENT_AMBULATORY_CARE_PROVIDER_SITE_OTHER): Payer: Self-pay | Admitting: General Surgery

## 2010-12-22 DIAGNOSIS — I82C19 Acute embolism and thrombosis of unspecified internal jugular vein: Secondary | ICD-10-CM

## 2010-12-22 DIAGNOSIS — T82898A Other specified complication of vascular prosthetic devices, implants and grafts, initial encounter: Secondary | ICD-10-CM

## 2010-12-22 LAB — CBC
HCT: 25.1 % — ABNORMAL LOW (ref 36.0–46.0)
Hemoglobin: 8.5 g/dL — ABNORMAL LOW (ref 12.0–15.0)
MCH: 28.4 pg (ref 26.0–34.0)
MCHC: 33.9 g/dL (ref 30.0–36.0)
MCV: 83.9 fL (ref 78.0–100.0)
Platelets: 190 10*3/uL (ref 150–400)
RBC: 2.99 MIL/uL — ABNORMAL LOW (ref 3.87–5.11)
RDW: 13.8 % (ref 11.5–15.5)
WBC: 7.2 10*3/uL (ref 4.0–10.5)

## 2010-12-22 LAB — GLUCOSE, CAPILLARY
Glucose-Capillary: 100 mg/dL — ABNORMAL HIGH (ref 70–99)
Glucose-Capillary: 92 mg/dL (ref 70–99)
Glucose-Capillary: 83 mg/dL (ref 70–99)
Glucose-Capillary: 80 mg/dL (ref 70–99)
Glucose-Capillary: 79 mg/dL (ref 70–99)
Glucose-Capillary: 119 mg/dL — ABNORMAL HIGH (ref 70–99)

## 2010-12-22 LAB — HEMOGLOBIN A1C
Hgb A1c MFr Bld: 6.2 % — ABNORMAL HIGH (ref <5.7)
Mean Plasma Glucose: 131 mg/dL — ABNORMAL HIGH (ref <117)

## 2010-12-22 LAB — HEPARIN LEVEL (UNFRACTIONATED): Heparin Unfractionated: 0.25 IU/mL — ABNORMAL LOW (ref 0.30–0.70)

## 2010-12-23 ENCOUNTER — Encounter: Payer: Self-pay | Admitting: Pulmonary Disease

## 2010-12-23 LAB — BASIC METABOLIC PANEL
BUN: 19 mg/dL (ref 6–23)
CO2: 29 mEq/L (ref 19–32)
Calcium: 9.3 mg/dL (ref 8.4–10.5)
Chloride: 104 mEq/L (ref 96–112)
Creatinine, Ser: 1.33 mg/dL — ABNORMAL HIGH (ref 0.50–1.10)
GFR calc Af Amer: 49 mL/min — ABNORMAL LOW (ref >90)
GFR calc non Af Amer: 42 mL/min — ABNORMAL LOW (ref >90)
Glucose, Bld: 125 mg/dL — ABNORMAL HIGH (ref 70–99)
Potassium: 3.6 mEq/L (ref 3.5–5.1)
Sodium: 141 mEq/L (ref 135–145)

## 2010-12-23 LAB — HEPARIN LEVEL (UNFRACTIONATED): Heparin Unfractionated: 0.1 IU/mL — ABNORMAL LOW (ref 0.30–0.70)

## 2010-12-23 LAB — CBC
HCT: 25.8 % — ABNORMAL LOW (ref 36.0–46.0)
Hemoglobin: 8.8 g/dL — ABNORMAL LOW (ref 12.0–15.0)
MCH: 28.4 pg (ref 26.0–34.0)
MCHC: 34.1 g/dL (ref 30.0–36.0)
MCV: 83.2 fL (ref 78.0–100.0)
Platelets: 210 10*3/uL (ref 150–400)
RBC: 3.1 MIL/uL — ABNORMAL LOW (ref 3.87–5.11)
RDW: 13.6 % (ref 11.5–15.5)
WBC: 6.6 10*3/uL (ref 4.0–10.5)

## 2010-12-23 LAB — FOLATE: Folate: >20 ng/mL

## 2010-12-23 LAB — HEPARIN ANTI-XA: Heparin LMW: 0.1 IU/mL

## 2010-12-23 LAB — GLUCOSE, CAPILLARY
Glucose-Capillary: 115 mg/dL — ABNORMAL HIGH (ref 70–99)
Glucose-Capillary: 101 mg/dL — ABNORMAL HIGH (ref 70–99)
Glucose-Capillary: 90 mg/dL (ref 70–99)
Glucose-Capillary: 104 mg/dL — ABNORMAL HIGH (ref 70–99)

## 2010-12-23 LAB — IRON AND TIBC
Iron: 28 ug/dL — ABNORMAL LOW (ref 42–135)
Saturation Ratios: 12 % — ABNORMAL LOW (ref 20–55)
TIBC: 230 ug/dL — ABNORMAL LOW (ref 250–470)
UIBC: 202 ug/dL (ref 125–400)

## 2010-12-23 LAB — VITAMIN B12: Vitamin B-12: 1018 pg/mL — ABNORMAL HIGH (ref 211–911)

## 2010-12-23 LAB — FERRITIN: Ferritin: 291 ng/mL (ref 10–291)

## 2010-12-23 LAB — PROTIME-INR
INR: 1.1 (ref 0.00–1.49)
Prothrombin Time: 14.4 seconds (ref 11.6–15.2)

## 2010-12-24 LAB — GLUCOSE, CAPILLARY
Glucose-Capillary: 105 mg/dL — ABNORMAL HIGH (ref 70–99)
Glucose-Capillary: 92 mg/dL (ref 70–99)

## 2010-12-24 LAB — CBC
HCT: 28.1 % — ABNORMAL LOW (ref 36.0–46.0)
Hemoglobin: 9.5 g/dL — ABNORMAL LOW (ref 12.0–15.0)
MCH: 28 pg (ref 26.0–34.0)
MCHC: 33.8 g/dL (ref 30.0–36.0)
MCV: 82.9 fL (ref 78.0–100.0)
Platelets: 244 10*3/uL (ref 150–400)
RBC: 3.39 MIL/uL — ABNORMAL LOW (ref 3.87–5.11)
RDW: 13.5 % (ref 11.5–15.5)
WBC: 7.9 10*3/uL (ref 4.0–10.5)

## 2010-12-24 LAB — PROTIME-INR
INR: 1.04 (ref 0.00–1.49)
Prothrombin Time: 13.8 seconds (ref 11.6–15.2)

## 2010-12-25 NOTE — Op Note (Signed)
  NAMEMKENNA, [name] NO.:  [id number]  MEDICAL RECORD NO.:  [id number]  LOCATION:  [facility]                         FACILITY:  [facility]  PHYSICIAN:  Merri Ray. Grandville Silos, M.D.DATE OF BIRTH:  1950/05/03  DATE OF PROCEDURE:  12/22/2010 DATE OF DISCHARGE:                              OPERATIVE REPORT   PREOPERATIVE DIAGNOSIS:  Right internal jugular thrombosis and Port-A- Cath in place.  POSTOPERATIVE DIAGNOSIS:  Right internal jugular thrombosis and Port-A- Cath in place.  PROCEDURE:  Removal of Port-A-Cath.  SURGEON:  Merri Ray. Grandville Silos, MD  ANESTHESIA:  MAC.  HISTORY OF PRESENT ILLNESS:  Ms. Maddox is a 60 year old female who had a Port-A-Cath placed by Dr. [name] in January of this year for breast cancer treatment.  She developed swelling in her right neck.  She was found to have a right IJ thrombosis.  She was admitted to the hospital.  She has been treated with heparin and we are proceeding with removal of her Port-A-Cath.  Her heparin was stopped this morning.  PROCEDURE IN DETAIL:  Informed consent was obtained.  The patient was identified in the preop holding area.  Her site was marked.  She was brought to the operating room.  She received intravenous antibiotics. MAC anesthesia was administered by the anesthesia staff.  Her right chest and neck were prepped and draped in sterile fashion.  Lidocaine 1% with epinephrine was injected along her old scar.  Subcutaneous tissues were dissected down revealing the port.  It did have a capsule of scar around it.  This was dissected away.  The Prolene sutures were divided and the port was flipped up out of the wound.  The tubing from the port was dissected free from some other scar tissue and split out nicely in one-piece.  Pressure was held up with her neck under IJ for several minutes.  The port was sent for gross identification and pathology. There was good hemostasis.  The wound was irrigated and then closed  in layers with deep tissues, closed with running 3-0 Vicryl stitch and the skin closed with running 4-0 Monocryl, subcuticular stitch followed by Dermabond.  Sponge, needle and instrument counts were all correct and there was excellent hemostasis.  The patient tolerated the procedure well without apparent complication and was taken to recovery room in stable condition.  We will plan on restarting of heparin in 12 hours.     Merri Ray Grandville Silos, M.D.     BET/MEDQ  D:  12/22/2010  T:  12/23/2010  Job:  [id number]  Electronically Signed by Georganna Skeans M.D. on 12/25/2010 02:29:55 PM

## 2010-12-26 ENCOUNTER — Telehealth (HOSPITAL_COMMUNITY): Payer: Self-pay | Admitting: *Deleted

## 2010-12-26 NOTE — Telephone Encounter (Signed)
[name] called, she was returning your call regarding the results of her recent echocardiogram.  She would like for you to call her when you have a chance.

## 2010-12-29 ENCOUNTER — Ambulatory Visit (INDEPENDENT_AMBULATORY_CARE_PROVIDER_SITE_OTHER): Payer: Medicare Other | Admitting: Pharmacist

## 2010-12-29 DIAGNOSIS — I82C11 Acute embolism and thrombosis of right internal jugular vein: Secondary | ICD-10-CM

## 2010-12-29 DIAGNOSIS — Z7901 Long term (current) use of anticoagulants: Secondary | ICD-10-CM

## 2010-12-29 DIAGNOSIS — I82C19 Acute embolism and thrombosis of unspecified internal jugular vein: Secondary | ICD-10-CM

## 2010-12-29 LAB — POCT INR: INR: 1.8

## 2010-12-29 NOTE — Patient Instructions (Signed)
Patient instructed to take medications as defined in the Anti-coagulation Track section of this encounter.  Patient instructed to take today's dose.  Patient verbalized understanding of these instructions.    

## 2010-12-29 NOTE — Progress Notes (Signed)
Indication: Acute jugular vein thrombosis in setting of breast cancer (11/2010) Duration: Unclear given diagnosis of active breast cancer.  If treated for cure, not necessarily life long.  Will ask Dr. Kelton Pillar to clarify at follow-up already schedule for later this week (01/01/2011).

## 2010-12-29 NOTE — Progress Notes (Signed)
Anti-Coagulation Progress Note  [name] is a 60 y.o. female who is currently on an anti-coagulation regimen.    RECENT RESULTS: Recent results are below, the most recent result is correlated with a dose of 10mg  per day. Lab Results  Component Value Date   INR 1.80 12/29/2010   INR 1.04 12/24/2010   INR 1.10 12/23/2010    ANTI-COAG DOSE:   Latest dosing instructions   Total [name] Tue Wed Thu Fri Sat   45   15 mg 15 mg 15 mg        (10 mg1.5) (10 mg1.5) (10 mg1.5)           ANTICOAG SUMMARY: Anticoagulation Episode Summary              Current INR goal 2.0-3.0 Next INR check 01/02/2011   INR from last check 1.80! (12/29/2010)     Weekly max dose (mg)  Target end date 03/21/2011   Indications    [facility] check location [facility] lab    Send INR reminders to    Comments             ANTICOAG TODAY: Anticoagulation Summary as of 12/29/2010              INR goal 2.0-3.0     Selected INR 1.80! (12/29/2010) Next INR check 01/02/2011   Weekly max dose (mg)  Target end date 03/21/2011   Indications     Anticoagulation Episode Summary              INR check location [facility] Preferred lab    Send INR reminders to    Comments             PATIENT INSTRUCTIONS: Patient Instructions  Patient instructed to take medications as defined in the Anti-coagulation Track section of this encounter.  Patient instructed to take today's dose.  Patient verbalized understanding of these instructions.        FOLLOW-UP Return in 4 days (on 01/02/2011) for Follow up INR.  [name], III Pharm.D., CACP

## 2010-12-29 NOTE — Discharge Summary (Signed)
Pt seen for PE, will follow up with Dr. Elie Confer. Please make sure she is doing okay. Provoked PE.

## 2010-12-31 ENCOUNTER — Telehealth (HOSPITAL_COMMUNITY): Payer: Self-pay | Admitting: *Deleted

## 2010-12-31 NOTE — Telephone Encounter (Signed)
Pt given lab and echo results

## 2010-12-31 NOTE — Telephone Encounter (Signed)
See phone note 10/10 pt given results

## 2010-12-31 NOTE — Telephone Encounter (Signed)
[name] called today, returning your call. She'd like for you to call her back

## 2011-01-01 ENCOUNTER — Encounter: Payer: Self-pay | Admitting: Internal Medicine

## 2011-01-01 ENCOUNTER — Ambulatory Visit (INDEPENDENT_AMBULATORY_CARE_PROVIDER_SITE_OTHER): Payer: Medicare Other | Admitting: Internal Medicine

## 2011-01-01 VITALS — HR 77 | Ht 68.0 in | Wt 231.6 lb

## 2011-01-01 DIAGNOSIS — E119 Type 2 diabetes mellitus without complications: Secondary | ICD-10-CM

## 2011-01-01 DIAGNOSIS — E785 Hyperlipidemia, unspecified: Secondary | ICD-10-CM

## 2011-01-01 DIAGNOSIS — I10 Essential (primary) hypertension: Secondary | ICD-10-CM

## 2011-01-01 DIAGNOSIS — J45991 Cough variant asthma: Secondary | ICD-10-CM

## 2011-01-01 DIAGNOSIS — I82C19 Acute embolism and thrombosis of unspecified internal jugular vein: Secondary | ICD-10-CM

## 2011-01-01 DIAGNOSIS — N259 Disorder resulting from impaired renal tubular function, unspecified: Secondary | ICD-10-CM

## 2011-01-01 LAB — BASIC METABOLIC PANEL
BUN: 18 mg/dL (ref 6–23)
CO2: 23 mEq/L (ref 19–32)
Calcium: 9.9 mg/dL (ref 8.4–10.5)
Chloride: 107 mEq/L (ref 96–112)
Creat: 1.23 mg/dL — ABNORMAL HIGH (ref 0.50–1.10)
Glucose, Bld: 75 mg/dL (ref 70–99)
Potassium: 4 mEq/L (ref 3.5–5.3)
Sodium: 145 mEq/L (ref 135–145)

## 2011-01-01 MED ORDER — OLMESARTAN MEDOXOMIL 40 MG PO TABS: 40.0000 mg | ORAL_TABLET | Freq: Every day | ORAL | Status: DC

## 2011-01-01 MED ORDER — MONTELUKAST SODIUM 10 MG PO TABS: 10.0000 mg | ORAL_TABLET | Freq: Every day | ORAL | Status: DC

## 2011-01-01 NOTE — Assessment & Plan Note (Signed)
Not on any treatment given h/o allergies. Continue to follow

## 2011-01-01 NOTE — Assessment & Plan Note (Signed)
Refill benicar

## 2011-01-01 NOTE — Progress Notes (Signed)
60 year old woman with past medical history of invasive ductal carcinoma of breast on chemotherapy and status post bilateral mastectomy, recent admission this month for thrombosed  Port-A-Cath and thrombosed right IJ, hypertension, diastolic dysfunction, hyperlipidemia, degenerative joint disease of the back, anxiety, GERD, history of stroke, diabetes mellitus and HbA1c of 6.7 comes to the clinic for followup.  No new complaints today. Compliant with her medications. She is Dr. Elsworth Soho for her cough variant asthma, Dr. Haroldine Laws for cardiomyopathy and management of toxicity from chemotherapy, Dr. Humphrey Rolls for breast cancer.  Physical exam  Pulse 77  Ht 5\' 8"  (1.727 m)  Wt 231 lb 9.6 oz (105.053 kg)  BMI 35.21 kg/m2  LMP 12/31/1968. Unable to obtain BP due to picc in right ar,. Surgical scarring on left, improper cuff for lower extremity BP check in clinc  General Appearance:    Alert, cooperative, no distress, appears stated age  Head:    Normocephalic, without obvious abnormality, atraumatic  Eyes:    PERRL, conjunctiva/corneas clear, EOM's intact, fundi    benign, both eyes       Ears:    Normal TM's and external ear canals, both ears  Nose:   Nares normal, septum midline, mucosa normal, no drainage   or sinus tenderness  Throat:   Lips, mucosa, and tongue normal; teeth and gums normal  Neck:   Supple, symmetrical, trachea midline, no adenopathy;       thyroid:  No enlargement/tenderness/nodules; no carotid   bruit or JVD  Back:     Symmetric, no curvature, ROM normal, no CVA tenderness  Lungs:     Clear to auscultation bilaterally, respirations unlabored  Chest wall:    No tenderness or deformity. S/p mastectomy  Heart:    Regular rate and rhythm, S1 and S2 normal, no murmur, rub   or gallop  Abdomen:     Soft, non-tender, bowel sounds active all four quadrants,    no masses, no organomegaly  Extremities:   Extremities normal, atraumatic, no cyanosis or edema. picc line for chemoRx in rt  arm  Pulses:   2+ and symmetric all extremities  Skin:   Skin color, texture, turgor normal, no rashes or lesions  Lymph nodes:   Cervical, supraclavicular, and axillary nodes normal  Neurologic:   CNII-XII intact. Normal strength, sensation and reflexes      throughout   Review of system Constitutional: Denies fever, chills, diaphoresis, appetite change and fatigue.  HEENT: Denies photophobia, eye pain, redness, hearing loss, ear pain, congestion, sore throat, rhinorrhea, sneezing, mouth sores, trouble swallowing, neck pain, neck stiffness and tinnitus.   Respiratory: Denies SOB, DOE, cough, chest tightness,  and wheezing.   Cardiovascular: Denies chest pain, palpitations and leg swelling.  Gastrointestinal: Denies nausea, vomiting, abdominal pain, diarrhea, constipation, blood in stool and abdominal distention.  Genitourinary: Denies dysuria, urgency, frequency, hematuria, flank pain and difficulty urinating.  Musculoskeletal: Denies myalgias, back pain, joint swelling, arthralgias and gait problem.  Skin: Denies pallor, rash and wound.  Neurological: Denies dizziness, seizures, syncope, weakness, light-headedness, numbness and headaches.  Hematological: Denies adenopathy. Easy bruising, personal or family bleeding history  Psychiatric/Behavioral: Denies suicidal ideation, mood changes, confusion, nervousness, sleep disturbance and agitation

## 2011-01-01 NOTE — Assessment & Plan Note (Signed)
Patient never got her Singulair prescribed by Dr. Elsworth Soho. We'll give her another prescription for that.

## 2011-01-01 NOTE — Assessment & Plan Note (Signed)
Completed lovenox course. On coumadin. Seeing Paulla Dolly tomorrow.

## 2011-01-01 NOTE — Patient Instructions (Signed)
Follow up in 3-4 months

## 2011-01-01 NOTE — Assessment & Plan Note (Signed)
Continue to follow.

## 2011-01-01 NOTE — Assessment & Plan Note (Signed)
Recheck BMET today 

## 2011-01-02 ENCOUNTER — Encounter (HOSPITAL_BASED_OUTPATIENT_CLINIC_OR_DEPARTMENT_OTHER): Payer: Medicare Other | Admitting: Oncology

## 2011-01-02 ENCOUNTER — Ambulatory Visit (INDEPENDENT_AMBULATORY_CARE_PROVIDER_SITE_OTHER): Payer: Medicare Other | Admitting: Pharmacist

## 2011-01-02 ENCOUNTER — Other Ambulatory Visit: Payer: Self-pay | Admitting: Physician Assistant

## 2011-01-02 DIAGNOSIS — Z7901 Long term (current) use of anticoagulants: Secondary | ICD-10-CM

## 2011-01-02 DIAGNOSIS — I82C11 Acute embolism and thrombosis of right internal jugular vein: Secondary | ICD-10-CM

## 2011-01-02 DIAGNOSIS — Z5111 Encounter for antineoplastic chemotherapy: Secondary | ICD-10-CM

## 2011-01-02 DIAGNOSIS — I82C19 Acute embolism and thrombosis of unspecified internal jugular vein: Secondary | ICD-10-CM

## 2011-01-02 DIAGNOSIS — C50919 Malignant neoplasm of unspecified site of unspecified female breast: Secondary | ICD-10-CM

## 2011-01-02 DIAGNOSIS — Z170 Estrogen receptor positive status [ER+]: Secondary | ICD-10-CM

## 2011-01-02 LAB — COMPREHENSIVE METABOLIC PANEL
ALT: 70 U/L — ABNORMAL HIGH (ref 0–35)
AST: 36 U/L (ref 0–37)
Albumin: 4.1 g/dL (ref 3.5–5.2)
Alkaline Phosphatase: 142 U/L — ABNORMAL HIGH (ref 39–117)
BUN: 20 mg/dL (ref 6–23)
CO2: 22 mEq/L (ref 19–32)
Calcium: 9.6 mg/dL (ref 8.4–10.5)
Chloride: 107 mEq/L (ref 96–112)
Creatinine, Ser: 1.28 mg/dL — ABNORMAL HIGH (ref 0.50–1.10)
Glucose, Bld: 104 mg/dL — ABNORMAL HIGH (ref 70–99)
Potassium: 4.4 mEq/L (ref 3.5–5.3)
Sodium: 144 mEq/L (ref 135–145)
Total Bilirubin: 0.3 mg/dL (ref 0.3–1.2)
Total Protein: 7.1 g/dL (ref 6.0–8.3)

## 2011-01-02 LAB — CBC WITH DIFFERENTIAL/PLATELET
BASO%: 0.5 % (ref 0.0–2.0)
Basophils Absolute: 0 10*3/uL (ref 0.0–0.1)
EOS%: 5.1 % (ref 0.0–7.0)
Eosinophils Absolute: 0.3 10*3/uL (ref 0.0–0.5)
HCT: 31 % — ABNORMAL LOW (ref 34.8–46.6)
HGB: 10 g/dL — ABNORMAL LOW (ref 11.6–15.9)
LYMPH%: 39.9 % (ref 14.0–49.7)
MCH: 27.2 pg (ref 25.1–34.0)
MCHC: 32.3 g/dL (ref 31.5–36.0)
MCV: 84.2 fL (ref 79.5–101.0)
MONO#: 0.4 10*3/uL (ref 0.1–0.9)
MONO%: 6.2 % (ref 0.0–14.0)
NEUT#: 3.1 10*3/uL (ref 1.5–6.5)
NEUT%: 48.3 % (ref 38.4–76.8)
Platelets: 368 10*3/uL (ref 145–400)
RBC: 3.68 10*6/uL — ABNORMAL LOW (ref 3.70–5.45)
RDW: 14.1 % (ref 11.2–14.5)
WBC: 6.4 10*3/uL (ref 3.9–10.3)
lymph#: 2.6 10*3/uL (ref 0.9–3.3)

## 2011-01-02 LAB — POCT INR: INR: 3.3

## 2011-01-02 MED ORDER — WARFARIN SODIUM 10 MG PO TABS: ORAL_TABLET | ORAL | Status: DC

## 2011-01-02 NOTE — Progress Notes (Signed)
Anti-Coagulation Progress Note  [name] is a 60 y.o. female who is currently on an anti-coagulation regimen.    RECENT RESULTS: Recent results are below, the most recent result is correlated with a dose of 15 mg per day since last visit. Lab Results  Component Value Date   INR 3.3 01/02/2011   INR 1.80 12/29/2010   INR 1.04 12/24/2010    ANTI-COAG DOSE:   Latest dosing instructions   Total Sun Mon Tue Wed Thu Fri Sat   30 10 mg     10 mg 10 mg    (10 mg1)     (10 mg1) (10 mg1)         ANTICOAG SUMMARY: Anticoagulation Episode Summary              Current INR goal 2.0-3.0 Next INR check 01/05/2011   INR from last check 3.3! (01/02/2011)     Weekly max dose (mg)  Target end date 03/21/2011   Indications    [facility] check location [facility] Preferred lab    Send INR reminders to    Comments             ANTICOAG TODAY: Anticoagulation Summary as of 01/02/2011              INR goal 2.0-3.0     Selected INR 3.3! (01/02/2011) Next INR check 01/05/2011   Weekly max dose (mg)  Target end date 03/21/2011   Indications     Anticoagulation Episode Summary              INR check location [facility] Preferred lab    Send INR reminders to    Comments             PATIENT INSTRUCTIONS: Patient Instructions  Patient instructed to take medications as defined in the Anti-coagulation Track section of this encounter.  Patient instructed to take today's dose.  Patient verbalized understanding of these instructions.        FOLLOW-UP Return in 3 days (on 01/05/2011) for Follow up INR.  [name], III Pharm.D., CACP

## 2011-01-02 NOTE — Patient Instructions (Signed)
Patient instructed to take medications as defined in the Anti-coagulation Track section of this encounter.  Patient instructed to take today's dose.  Patient verbalized understanding of these instructions.    

## 2011-01-03 NOTE — Discharge Summary (Addendum)
[name], [name] NO.:  [id number]  MEDICAL RECORD NO.:  [id number]  LOCATION:  [facility]                         FACILITY:  [facility]  PHYSICIAN:  Dr. Orene Leach               DATE OF BIRTH:  01-Mar-1951  DATE OF ADMISSION:  12/20/2010 DATE OF DISCHARGE:  12/24/2010                              DISCHARGE SUMMARY   DISCHARGE DIAGNOSES: 1. Right neck, right internal jugular thrombosis. 2. Breast cancer. 3. Hypertension. 4. Diabetes mellitus. 5. Asthma. 6. Anxiety.  DISCHARGE MEDICATIONS: 1. Doxycycline 100 mg 1 tablet by mouth twice daily for 5 days. 2. Lovenox 100 mg injection subcutaneously twice daily, take for 7     days. 3. Letrozole 2.5 mg tablet 1 by mouth daily. 4. Warfarin 10 mg 1 tablet by mouth daily until INR check on Monday. 5. Advair Diskus 100 mg, 50 mg 1 puff inhaled twice daily. 6. Albuterol inhaler 2 puffs inhaled every 4 hours as needed for     shortness of breath. 7. Ferrous sulfate 325 mg 1 tablet by mouth 3 times daily. 8. Lisinopril 40 mg 1 tablet by mouth daily. 9. Lorazepam 1 mg 1 tablet by mouth daily as needed for anxiety. 10.Multivitamin 1 tablet by mouth daily. 11.Nexium 40 mg tablet, 1 tablet by mouth daily. 12.Norvasc 10 mg 1 tablet by mouth daily. 13.Tramadol 50 mg 1 tablet by mouth every 6 hours as needed.  DISPOSITION AND FOLLOWUP:  The patient will be seen back on December 29, 2010, this is on Monday at 11:15 with Dr. Johney Leach, our pharmacist who runs the [facility] at our [facility].  She will also be seen on January 01, 2011 at 3:15 with her primary care doctor, Dr. Kelton Leach in our [facility].  Please follow up on how she is doing with her Coumadin, INR levels, and see any other sequelae of this IJ thrombosis that she may be having.  Please check on resolution of the symptoms and how she is doing.  PROCEDURES PERFORMED:  Right IJ thrombectomy.  CONSULTATION:  Surgery.  BRIEF ADMITTING HISTORY AND PHYSICAL:   This is a very pleasant 60 year old woman with past medical history of breast cancer treated with chemotherapy through right IJ cath, last treatment December 12, 2010, hypertension, asthma, who presents with right neck soreness 2-3 days prior to admission.  Initially, thought the pain was due to sinus drainage.  Describes the pain as soreness, worse with movement of the neck, endorses some pain with swallowing, though reports eating well. No alleviating factors, but she did call her surgeon's office on December 18, 2010 with her Port-A-Cath site being sore.  At that time, she indicated the area was not red or inflamed, no discharge or drainage, instructed to take Tylenol, and use compress.  She did use tramadol and some Tylenol, but got no relief.  She states that the cath was inserted on February 04, 2010 and was scheduled to be removed in mid January 2013.  She does receive chemotherapy once every 3 weeks.  Next schedule is January 01, 2011.  Denies pain or edema in her right arm. The patient states that catheter was last  used on December 12, 2010. Some medication changes were noted in the past, so in recent medication change, her hydrochlorothiazide and lisinopril were discontinued. Lasix, potassium, and Benicar were started.  CURRENT MEDICATIONS: 1. She does have albuterol inhaler 2 puffs once every 4 hours as     needed for shortness of breath. 2. Aspirin 81 mg 1 tablet by mouth daily. 3. Iron sulfate 325 mg take 1 by mouth twice daily. 4. Advair Diskus 250/50, take 1 puff into the lungs twice daily. 5. Lasix 40 mg tablet 1 by mouth daily. 6. Letrozole 2.5 mg 1 tablet by mouth daily. 7. Benicar 40 mg tablet 1 by mouth daily. 8. Lorazepam 1 mg tablet take 1 by mouth daily as needed for anxiety. 9. Multivitamin take 1 by mouth daily. 10.Nexium 40 mg capsule take 1 by mouth daily. 11.Potassium chloride 20 mEq tablet take 1 by mouth daily. 12.Tramadol 40 mg tablet take 1 by  mouth every 6 hours as needed for     pain.  ALLERGIES:  STATINS.  PAST MEDICAL HISTORY: 1. Depression, which is seasonal in nature. 2. Hyperlipidemia. 3. Hypertension. 4. Arthritis. 5. Myopathy. 6. Breast cancer. 7. Back pain. 8. History of a bilateral mastectomy. 9. Oophorectomy. 10.Laminectomy. 11.Abdominal hysterectomy for fibroids.  REVIEW OF SYSTEMS:  Please see HPI for pertinent.  PHYSICAL EXAMINATION:  VITAL SIGNS:  Temperature 99.7, heart rate 91, blood pressure 125/79, respirations 18, oxygen saturation 98% on room air.  GENERAL:  Resting in bed, no acute distress, well developed, obese woman. HEENT:  Pupils equal, round, and reactive to light and accommodation. Extraocular muscles are intact.  No scleral icterus.  NECK:  Right side of the neck tender to palpation in the area of the right IJ, right subclavian muscle. CHEST:  Site of the right Port-A-Cath is clean.  No purulent or bloody drainage. CARDIAC:  Regular rate and rhythm.  No rubs, murmurs, or gallops. PULMONARY:  Clear to auscultation bilaterally.  Moving normal amounts of air. ABDOMEN:  Soft, nontender, nondistended, obese.  Bowel sounds present. EXTREMITIES:  Warm and well perfused.  Bilateral pitting edema. NEUROLOGIC:  Awake and oriented x3.  Cranial nerves II through XII are intact.  Strength and sensation to light touch equal in bilateral upper and lower extremities.  LABORATORY RESULTS:  On the day of exam, there was a sodium of 139, potassium 3.8, chloride 102, bicarb 30, glucose 100, BUN 17, creatinine 1.37, calcium 9.6.  CBC drawn white count 9.1, hemoglobin 10.1, platelets 192, a pro BNP was 29.  DIAGNOSTIC IMAGING:  There was a chest x-ray that did show no active lung disease.  No change in position a Port-A-Cath tip and upper SVC.  A CT head done without contrast did show stable negative noncontrast CT appearance of the brain, early metastatic disease to the brain cannot be excluded in  the absence of IV contrast.  New ethmoid and sphenoid sinus inflammatory changes could reflect acute sinusitis in the appropriate clinical contacts.  There was also a CT of the soft tissue neck with contrast that did show acute thrombosis of the right internal jugular vein, thrombosed segment extends from just below the angle of the mandible to the confluence with the right subclavian vein, secondary inflammatory changes surrounding the right carotid base with reactive lymph nodes, and small retroperitoneal effusion, right subclavian vein, innominate vein, and visualized SVC remained patent.  HOSPITAL COURSE BY PROBLEM: 1. Right neck tenderness secondary to thrombosis of the right IJ was  shown by CT, likely secondary to this right-side Port-A-Cath, so     Surgery did remove the Port-A-Cath during this hospitalization.  We     then did switch her on to Lovenox and Coumadin.  She will need to     be anticoagulated for 3 months prior to this.  We did call Dr.     Humphrey Rolls, her oncologist to let her know about this and she was okay     with this plan.  We did give her Vicodin for pain management.  We     did follow her during this hospitalization.  Status post     thrombectomy, the patient was stable and having less pain in the     area and her headache was resolving.  She was then deemed stable at     the time of discharge to go home. 2. Breast cancer.  We did continue her Letrozole, and we contacted Dr.     Humphrey Rolls regarding this hospitalization.  She will follow up with Dr.     Humphrey Rolls as previously scheduled. 3. Hypertension.  We did continue her Benicar and Lasix, goal was less     than 130/80.  She was stable in regard to this during this     hospitalization. 4. Diabetes mellitus.  Last A1c was in April, 6.7.  We did use sliding     scale during this hospitalization, does appear to be diet     controlled.  We did not start any medications at the time of     discharge and she was not  requiring insulin during this     hospitalization. 5. Asthma.  We did continue her Advair and have albuterol as needed     for her. 6. Anxiety.  We did use Ativan p.r.n. during this hospitalization at     her current outpatient dosing, but she was stable in regard to this     problem and was controlled during this hospitalization.  At this     time, the patient was deemed stable to be discharged from the     hospital.  [place]:  Vitals on the day of discharge:  Temperature 98.5, pulse 92, respirations 18, blood pressure 156/88, oxygen saturation was 94% on room air.  Further laboratory results, there was a PT/INR that was drawn, PT was 13.8, INR was 1.04.  A CBC with differential that showed white count of 7.9, hemoglobin of 9.5, and platelet count of 244.  There was an anemia panel that was drawn that did show an iron of 28, a TIBC of 230, percent saturation of 12, vitamin D level was slightly high, folate was normal, ferritin was normal. There was a basic metabolic panel that did show sodium 141, potassium 3.6, chloride 104, bicarb 29, glucose 125, BUN 19, creatinine 1.33, calcium of 9.3, hemoglobin A1c did show a hemoglobin A1c of 6.2.  There was an imaging results that did show that a PICC line that was inserted after this hospitalization was in the correct position.  She was discharged home with the PICC line, which she will be using to get her chemotherapy in the future.  The Port-A-Cath was removed during this hospitalization.    ______________________________ [name], MD   ______________________________ Dr. Orene Leach    EK/MEDQ  D:  12/24/2010  T:  12/24/2010  Job:  [id number]  cc:   [facility] [place] , MD Marcy Panning, M.D.  Electronically Signed by Benjamine Mola  Doug Sou MD on 01/03/2011 10:46:14 AM Electronically Signed by Lars Mage M.D. on 01/14/2011 03:54:45 PM

## 2011-01-05 ENCOUNTER — Ambulatory Visit (INDEPENDENT_AMBULATORY_CARE_PROVIDER_SITE_OTHER): Payer: Medicare Other | Admitting: Pharmacist

## 2011-01-05 ENCOUNTER — Encounter (HOSPITAL_BASED_OUTPATIENT_CLINIC_OR_DEPARTMENT_OTHER): Payer: Medicare Other | Admitting: Oncology

## 2011-01-05 DIAGNOSIS — Z452 Encounter for adjustment and management of vascular access device: Secondary | ICD-10-CM

## 2011-01-05 DIAGNOSIS — I82C11 Acute embolism and thrombosis of right internal jugular vein: Secondary | ICD-10-CM

## 2011-01-05 DIAGNOSIS — Z7901 Long term (current) use of anticoagulants: Secondary | ICD-10-CM

## 2011-01-05 DIAGNOSIS — I82C19 Acute embolism and thrombosis of unspecified internal jugular vein: Secondary | ICD-10-CM

## 2011-01-05 DIAGNOSIS — C50919 Malignant neoplasm of unspecified site of unspecified female breast: Secondary | ICD-10-CM

## 2011-01-05 DIAGNOSIS — Z170 Estrogen receptor positive status [ER+]: Secondary | ICD-10-CM

## 2011-01-05 LAB — POCT INR: INR: 3.6

## 2011-01-05 NOTE — Progress Notes (Signed)
Anti-Coagulation Progress Note  [name] is a 60 y.o. female who is currently on an anti-coagulation regimen.    RECENT RESULTS: Recent results are below, the most recent result is correlated with a dose of 70 mg. per week: Lab Results  Component Value Date   INR 3.6 01/05/2011   INR 3.3 01/02/2011   INR 1.80 12/29/2010    ANTI-COAG DOSE:   Latest dosing instructions   Total Sun Mon Tue Wed Thu Fri Sat   60 10 mg 5 mg 10 mg 10 mg 5 mg 10 mg 10 mg    (10 mg1) (10 mg0.5) (10 mg1) (10 mg1) (10 mg0.5) (10 mg1) (10 mg1)         ANTICOAG SUMMARY: Anticoagulation Episode Summary              Current INR goal 2.0-3.0 Next INR check 01/19/2011   INR from last check 3.6! (01/05/2011)     Weekly max dose (mg)  Target end date 03/21/2011   Indications    INR check location [facility]erred lab    Send INR reminders to    Comments             ANTICOAG TODAY: Anticoagulation Summary as of 01/05/2011              INR goal 2.0-3.0     Selected INR 3.6! (01/05/2011) Next INR check 01/19/2011   Weekly max dose (mg)  Target end date 03/21/2011   Indications     Anticoagulation Episode Summary              INR check location [facility] Preferred lab    Send INR reminders to    Comments             PATIENT INSTRUCTIONS: Patient Instructions  Patient instructed to take medications as defined in the Anti-coagulation Track section of this encounter.  Patient instructed to take today's dose.  Patient verbalized understanding of these instructions.        FOLLOW-UP Return in 2 weeks (on 01/19/2011) for Follow up INR.  [name], III Pharm.D., CACP

## 2011-01-05 NOTE — Patient Instructions (Signed)
Patient instructed to take medications as defined in the Anti-coagulation Track section of this encounter.  Patient instructed to take today's dose.  Patient verbalized understanding of these instructions.    

## 2011-01-07 ENCOUNTER — Encounter (HOSPITAL_BASED_OUTPATIENT_CLINIC_OR_DEPARTMENT_OTHER): Payer: Medicare Other | Admitting: Oncology

## 2011-01-07 DIAGNOSIS — C50919 Malignant neoplasm of unspecified site of unspecified female breast: Secondary | ICD-10-CM

## 2011-01-07 DIAGNOSIS — Z170 Estrogen receptor positive status [ER+]: Secondary | ICD-10-CM

## 2011-01-07 DIAGNOSIS — Z452 Encounter for adjustment and management of vascular access device: Secondary | ICD-10-CM

## 2011-01-09 ENCOUNTER — Telehealth: Payer: Self-pay | Admitting: *Deleted

## 2011-01-09 ENCOUNTER — Other Ambulatory Visit: Payer: Self-pay | Admitting: Physician Assistant

## 2011-01-09 ENCOUNTER — Encounter: Payer: Medicare Other | Admitting: Oncology

## 2011-01-09 LAB — PROTIME-INR
INR: 3.8 — ABNORMAL HIGH (ref 2.00–3.50)
Protime: 45.6 Seconds — ABNORMAL HIGH (ref 10.6–13.4)

## 2011-01-09 NOTE — Telephone Encounter (Signed)
It appears that Dr Elie Confer decreased coumadin from 70 mg weekly to 60 mg weekly. Since this is a one time occurrence of BRBPR and she seems to be stable (no dizziness, pain, vomiting, D) she can cont to take coumadin as rx and let us know if she cont to have sxs or deteriorates. Colon Ca screening can be discussed at next visit if indicated.

## 2011-01-09 NOTE — Telephone Encounter (Signed)
Pt called stating she noted blood in stool today. Christell Constant asked her to call him  if this ever happened.  Pt wanted the pager # for Dr Elie Confer because she lost his #. I gave it to her and asked her to call back if she was not able to reach him. Pt voices understanding.

## 2011-01-09 NOTE — Telephone Encounter (Signed)
Pt informed and voices understanding 

## 2011-01-09 NOTE — Telephone Encounter (Signed)
Pt reports bright red blood in stool today. No other signs of bleeding, this is a one time episode.   Noted the blood when she wiped herself. Pt on coumadin , last visit was Monday 10/15 with INR 3.6  It looks like dose of coumadin was increased from previous on 10/12. Pt paged Dr Elie Confer but he has not responded. Please advise. Pt # [id number]

## 2011-01-10 ENCOUNTER — Other Ambulatory Visit: Payer: Self-pay | Admitting: Oncology

## 2011-01-10 DIAGNOSIS — C50919 Malignant neoplasm of unspecified site of unspecified female breast: Secondary | ICD-10-CM

## 2011-01-10 MED ORDER — SODIUM CHLORIDE 0.9 % IJ SOLN: 10.0000 mL | INTRAMUSCULAR | Status: DC | PRN

## 2011-01-10 MED ORDER — HEPARIN SOD (PORK) LOCK FLUSH 100 UNIT/ML IV SOLN: 500.0000 [IU] | INTRAVENOUS | Status: DC

## 2011-01-12 ENCOUNTER — Encounter (HOSPITAL_BASED_OUTPATIENT_CLINIC_OR_DEPARTMENT_OTHER): Payer: Medicare Other | Admitting: Oncology

## 2011-01-12 ENCOUNTER — Other Ambulatory Visit: Payer: Self-pay | Admitting: Physician Assistant

## 2011-01-12 DIAGNOSIS — Z7901 Long term (current) use of anticoagulants: Secondary | ICD-10-CM

## 2011-01-12 DIAGNOSIS — C50919 Malignant neoplasm of unspecified site of unspecified female breast: Secondary | ICD-10-CM

## 2011-01-12 DIAGNOSIS — Z452 Encounter for adjustment and management of vascular access device: Secondary | ICD-10-CM

## 2011-01-12 DIAGNOSIS — Z170 Estrogen receptor positive status [ER+]: Secondary | ICD-10-CM

## 2011-01-12 LAB — PROTIME-INR
INR: 2.5 (ref 2.00–3.50)
Protime: 30 Seconds — ABNORMAL HIGH (ref 10.6–13.4)

## 2011-01-12 NOTE — Telephone Encounter (Signed)
Agree, risk of clotting higher than bleeding at this time. Will follow up on next clinic visit, no schedule in near future but patient is going to be seen at oncologist and cardiologist's office. She can call us if bleeding continues

## 2011-01-13 ENCOUNTER — Other Ambulatory Visit: Payer: Self-pay | Admitting: Internal Medicine

## 2011-01-14 ENCOUNTER — Other Ambulatory Visit: Payer: Self-pay | Admitting: Oncology

## 2011-01-14 ENCOUNTER — Other Ambulatory Visit: Payer: Self-pay | Admitting: Physician Assistant

## 2011-01-14 ENCOUNTER — Encounter (HOSPITAL_BASED_OUTPATIENT_CLINIC_OR_DEPARTMENT_OTHER): Payer: Medicare Other | Admitting: Oncology

## 2011-01-14 DIAGNOSIS — Z170 Estrogen receptor positive status [ER+]: Secondary | ICD-10-CM

## 2011-01-14 DIAGNOSIS — C50919 Malignant neoplasm of unspecified site of unspecified female breast: Secondary | ICD-10-CM

## 2011-01-14 DIAGNOSIS — Z5111 Encounter for antineoplastic chemotherapy: Secondary | ICD-10-CM

## 2011-01-14 DIAGNOSIS — Z5112 Encounter for antineoplastic immunotherapy: Secondary | ICD-10-CM

## 2011-01-14 DIAGNOSIS — Z452 Encounter for adjustment and management of vascular access device: Secondary | ICD-10-CM

## 2011-01-14 LAB — URINALYSIS, MICROSCOPIC - CHCC
Bilirubin (Urine): NEGATIVE
Blood: NEGATIVE
Glucose: NEGATIVE g/dL
Ketones: NEGATIVE mg/dL
Nitrite: NEGATIVE
Protein: NEGATIVE mg/dL
RBC count: NEGATIVE (ref 0–2)
Specific Gravity, Urine: 1.01 (ref 1.003–1.035)
pH: 6 (ref 4.6–8.0)

## 2011-01-16 ENCOUNTER — Encounter (HOSPITAL_BASED_OUTPATIENT_CLINIC_OR_DEPARTMENT_OTHER): Payer: Medicare Other | Admitting: Oncology

## 2011-01-16 ENCOUNTER — Other Ambulatory Visit: Payer: Self-pay | Admitting: Physician Assistant

## 2011-01-16 DIAGNOSIS — Z452 Encounter for adjustment and management of vascular access device: Secondary | ICD-10-CM

## 2011-01-16 DIAGNOSIS — Z170 Estrogen receptor positive status [ER+]: Secondary | ICD-10-CM

## 2011-01-16 DIAGNOSIS — Z7901 Long term (current) use of anticoagulants: Secondary | ICD-10-CM

## 2011-01-16 DIAGNOSIS — Z5111 Encounter for antineoplastic chemotherapy: Secondary | ICD-10-CM

## 2011-01-16 DIAGNOSIS — Z5112 Encounter for antineoplastic immunotherapy: Secondary | ICD-10-CM

## 2011-01-16 DIAGNOSIS — C50919 Malignant neoplasm of unspecified site of unspecified female breast: Secondary | ICD-10-CM

## 2011-01-16 LAB — PROTIME-INR
INR: 2.2 (ref 2.00–3.50)
Protime: 26.4 Seconds — ABNORMAL HIGH (ref 10.6–13.4)

## 2011-01-16 LAB — URINE CULTURE

## 2011-01-19 ENCOUNTER — Ambulatory Visit: Payer: Medicare Other

## 2011-01-19 ENCOUNTER — Encounter (HOSPITAL_BASED_OUTPATIENT_CLINIC_OR_DEPARTMENT_OTHER): Payer: Medicare Other | Admitting: Oncology

## 2011-01-19 DIAGNOSIS — C50919 Malignant neoplasm of unspecified site of unspecified female breast: Secondary | ICD-10-CM

## 2011-01-19 DIAGNOSIS — Z452 Encounter for adjustment and management of vascular access device: Secondary | ICD-10-CM

## 2011-01-19 DIAGNOSIS — Z170 Estrogen receptor positive status [ER+]: Secondary | ICD-10-CM

## 2011-01-21 ENCOUNTER — Ambulatory Visit (HOSPITAL_BASED_OUTPATIENT_CLINIC_OR_DEPARTMENT_OTHER): Payer: Medicare Other | Admitting: Oncology

## 2011-01-21 DIAGNOSIS — C50919 Malignant neoplasm of unspecified site of unspecified female breast: Secondary | ICD-10-CM

## 2011-01-21 DIAGNOSIS — Z95828 Presence of other vascular implants and grafts: Secondary | ICD-10-CM

## 2011-01-21 DIAGNOSIS — Z452 Encounter for adjustment and management of vascular access device: Secondary | ICD-10-CM

## 2011-01-21 DIAGNOSIS — I82C19 Acute embolism and thrombosis of unspecified internal jugular vein: Secondary | ICD-10-CM

## 2011-01-21 DIAGNOSIS — Z170 Estrogen receptor positive status [ER+]: Secondary | ICD-10-CM

## 2011-01-21 MED ORDER — HEPARIN (PORCINE) LOCK FLUSH 10 UNIT/ML IV SOLN: 10.0000 [IU] | INTRAVENOUS | Status: DC

## 2011-01-22 ENCOUNTER — Other Ambulatory Visit: Payer: Self-pay | Admitting: Physician Assistant

## 2011-01-22 ENCOUNTER — Encounter (HOSPITAL_BASED_OUTPATIENT_CLINIC_OR_DEPARTMENT_OTHER): Payer: Medicare Other | Admitting: Oncology

## 2011-01-22 DIAGNOSIS — Z5111 Encounter for antineoplastic chemotherapy: Secondary | ICD-10-CM

## 2011-01-22 DIAGNOSIS — D649 Anemia, unspecified: Secondary | ICD-10-CM

## 2011-01-22 DIAGNOSIS — C50919 Malignant neoplasm of unspecified site of unspecified female breast: Secondary | ICD-10-CM

## 2011-01-22 DIAGNOSIS — Z170 Estrogen receptor positive status [ER+]: Secondary | ICD-10-CM

## 2011-01-22 DIAGNOSIS — I10 Essential (primary) hypertension: Secondary | ICD-10-CM

## 2011-01-22 DIAGNOSIS — Z5112 Encounter for antineoplastic immunotherapy: Secondary | ICD-10-CM

## 2011-01-22 LAB — CBC WITH DIFFERENTIAL/PLATELET
BASO%: 0.4 % (ref 0.0–2.0)
Basophils Absolute: 0 10*3/uL (ref 0.0–0.1)
EOS%: 3.7 % (ref 0.0–7.0)
Eosinophils Absolute: 0.3 10*3/uL (ref 0.0–0.5)
HCT: 33.2 % — ABNORMAL LOW (ref 34.8–46.6)
HGB: 11.1 g/dL — ABNORMAL LOW (ref 11.6–15.9)
LYMPH%: 56.2 % — ABNORMAL HIGH (ref 14.0–49.7)
MCH: 27.6 pg (ref 25.1–34.0)
MCHC: 33.4 g/dL (ref 31.5–36.0)
MCV: 82.6 fL (ref 79.5–101.0)
MONO#: 0.4 10*3/uL (ref 0.1–0.9)
MONO%: 5.9 % (ref 0.0–14.0)
NEUT#: 2.5 10*3/uL (ref 1.5–6.5)
NEUT%: 33.8 % — ABNORMAL LOW (ref 38.4–76.8)
Platelets: 203 10*3/uL (ref 145–400)
RBC: 4.02 10*6/uL (ref 3.70–5.45)
RDW: 13.8 % (ref 11.2–14.5)
WBC: 7.3 10*3/uL (ref 3.9–10.3)
lymph#: 4.1 10*3/uL — ABNORMAL HIGH (ref 0.9–3.3)
nRBC: 0 % (ref 0–0)

## 2011-01-22 LAB — COMPREHENSIVE METABOLIC PANEL
ALT: 24 U/L (ref 0–35)
AST: 20 U/L (ref 0–37)
Albumin: 3.9 g/dL (ref 3.5–5.2)
Alkaline Phosphatase: 115 U/L (ref 39–117)
BUN: 23 mg/dL (ref 6–23)
CO2: 24 mEq/L (ref 19–32)
Calcium: 10.1 mg/dL (ref 8.4–10.5)
Chloride: 106 mEq/L (ref 96–112)
Creatinine, Ser: 1.38 mg/dL — ABNORMAL HIGH (ref 0.50–1.10)
Glucose, Bld: 71 mg/dL (ref 70–99)
Potassium: 3.7 mEq/L (ref 3.5–5.3)
Sodium: 141 mEq/L (ref 135–145)
Total Bilirubin: 0.2 mg/dL — ABNORMAL LOW (ref 0.3–1.2)
Total Protein: 7.4 g/dL (ref 6.0–8.3)

## 2011-01-23 ENCOUNTER — Encounter (HOSPITAL_BASED_OUTPATIENT_CLINIC_OR_DEPARTMENT_OTHER): Payer: Medicare Other | Admitting: Oncology

## 2011-01-23 DIAGNOSIS — C50919 Malignant neoplasm of unspecified site of unspecified female breast: Secondary | ICD-10-CM

## 2011-01-23 DIAGNOSIS — Z5112 Encounter for antineoplastic immunotherapy: Secondary | ICD-10-CM

## 2011-01-26 ENCOUNTER — Ambulatory Visit (HOSPITAL_BASED_OUTPATIENT_CLINIC_OR_DEPARTMENT_OTHER): Payer: Medicare Other

## 2011-01-26 ENCOUNTER — Other Ambulatory Visit: Payer: Self-pay | Admitting: *Deleted

## 2011-01-26 DIAGNOSIS — Z452 Encounter for adjustment and management of vascular access device: Secondary | ICD-10-CM

## 2011-01-26 DIAGNOSIS — C50919 Malignant neoplasm of unspecified site of unspecified female breast: Secondary | ICD-10-CM

## 2011-01-26 DIAGNOSIS — Z170 Estrogen receptor positive status [ER+]: Secondary | ICD-10-CM

## 2011-01-26 MED ORDER — SODIUM CHLORIDE 0.9 % IJ SOLN
10.0000 mL | INTRAMUSCULAR | Status: DC | PRN
  Administered 2011-01-26 (×2): 10 mL via INTRAVENOUS

## 2011-01-26 MED ORDER — HEPARIN SOD (PORK) LOCK FLUSH 100 UNIT/ML IV SOLN
500.0000 [IU] | Freq: Once | INTRAVENOUS | Status: AC
  Administered 2011-01-26: 500 [IU] via INTRAVENOUS

## 2011-01-26 NOTE — Progress Notes (Signed)
Addended by: Shanon Rosser on: 01/26/2011 04:46 PM   Modules accepted: Orders

## 2011-01-26 NOTE — Telephone Encounter (Signed)
Med called in 10/23

## 2011-01-28 ENCOUNTER — Ambulatory Visit (HOSPITAL_BASED_OUTPATIENT_CLINIC_OR_DEPARTMENT_OTHER): Payer: Medicare Other

## 2011-01-28 DIAGNOSIS — Z452 Encounter for adjustment and management of vascular access device: Secondary | ICD-10-CM

## 2011-01-28 DIAGNOSIS — C50919 Malignant neoplasm of unspecified site of unspecified female breast: Secondary | ICD-10-CM

## 2011-01-28 MED ORDER — HEPARIN SOD (PORK) LOCK FLUSH 100 UNIT/ML IV SOLN
500.0000 [IU] | Freq: Once | INTRAVENOUS | Status: AC
  Administered 2011-01-28: 250 [IU] via INTRAVENOUS
  Filled 2011-01-28: qty 5

## 2011-01-28 MED ORDER — SODIUM CHLORIDE 0.9 % IJ SOLN
10.0000 mL | INTRAMUSCULAR | Status: DC | PRN
  Administered 2011-01-28 (×2): 10 mL via INTRAVENOUS
  Filled 2011-01-28: qty 10

## 2011-01-28 NOTE — Progress Notes (Signed)
DL PICC Flushed per protocol. Good blood return from both ports.

## 2011-01-29 ENCOUNTER — Other Ambulatory Visit: Payer: Self-pay | Admitting: Oncology

## 2011-01-30 ENCOUNTER — Encounter: Payer: Self-pay | Admitting: *Deleted

## 2011-01-30 ENCOUNTER — Other Ambulatory Visit: Payer: Self-pay | Admitting: Oncology

## 2011-01-30 ENCOUNTER — Ambulatory Visit: Payer: Medicare Other

## 2011-01-30 ENCOUNTER — Telehealth: Payer: Self-pay | Admitting: Oncology

## 2011-01-30 ENCOUNTER — Ambulatory Visit (HOSPITAL_BASED_OUTPATIENT_CLINIC_OR_DEPARTMENT_OTHER): Payer: Medicare Other | Admitting: Oncology

## 2011-01-30 DIAGNOSIS — I82C19 Acute embolism and thrombosis of unspecified internal jugular vein: Secondary | ICD-10-CM

## 2011-01-30 DIAGNOSIS — C50919 Malignant neoplasm of unspecified site of unspecified female breast: Secondary | ICD-10-CM

## 2011-01-30 DIAGNOSIS — Z170 Estrogen receptor positive status [ER+]: Secondary | ICD-10-CM

## 2011-01-30 DIAGNOSIS — I82890 Acute embolism and thrombosis of other specified veins: Secondary | ICD-10-CM

## 2011-01-30 DIAGNOSIS — I82409 Acute embolism and thrombosis of unspecified deep veins of unspecified lower extremity: Secondary | ICD-10-CM

## 2011-01-30 MED ORDER — HEPARIN SOD (PORK) LOCK FLUSH 100 UNIT/ML IV SOLN
500.0000 [IU] | Freq: Once | INTRAVENOUS | Status: AC
  Administered 2011-01-30: 500 [IU] via INTRAVENOUS
  Filled 2011-01-30: qty 5

## 2011-01-30 MED ORDER — SODIUM CHLORIDE 0.9 % IJ SOLN
10.0000 mL | Freq: Once | INTRAMUSCULAR | Status: AC
  Administered 2011-01-30: 10 mL via INTRAVENOUS
  Filled 2011-01-30: qty 10

## 2011-01-30 NOTE — Telephone Encounter (Signed)
Gv pts daughter appt  for [id number]

## 2011-01-30 NOTE — Progress Notes (Signed)
Note dictated

## 2011-01-30 NOTE — Progress Notes (Signed)
CC:   [name]. Bensimhon, MD [name], M.D. [name], MD [name], MD  DIAGNOSES: 30. A 60 year old female with ER/PR-positive, HER-2/neu-positive     invasive ductal carcinoma of the left breast, clinical stage IIA.     Originally diagnosed in October 2011.  The patient has undergone     bilateral mastectomies. 2. The patient also is status post right cerebrovascular accident. 3. Right internal jugular deep venous thrombosis secondary to port.     She now has a PICC line in place.  CURRENT THERAPY:  The patient is on Herceptin every 3 weeks and she will complete a year in January 2013.  She is also on letrozole 2.5 mg daily.  INTERVAL HISTORY:  The patient is seen in followup today.  Overall she seems to be doing well.  She has been getting PICC line flushes 3 times a week without any significant problems.  She has not had any evidence of recurrence of thrombosis.  The patient is denying any nausea, vomiting, fevers, chills, night sweats, headaches.  She has no shortness of breath.  ALLERGIES:  Atorvastatin and rosuvastatin.  CURRENT MEDICATIONS:  reviewed and they are updated.  PERFORMANCE STATUS:  ECOG performance status is 1.  PHYSICAL EXAMINATION:  The patient is awake, alert, in no acute distress.  She appears well.  Vital signs:  Temperature is 98.0, pulse 73, blood pressure 163/87, weight is 230 pounds.  HEENT:  EOMI.  PERRLA. Sclerae are anicteric.  No conjunctival pallor.  Oral mucosa is moist. Neck:  Supple.  Lungs:  Clear bilaterally to auscultation and percussion.  Cardiovascular:  Regular rate rhythm.  No murmurs, gallops or rubs.  Abdomen:  Soft, nontender, nondistended.  Bowel sounds are present.  No HSM.  Extremities:  No edema, clubbing or cyanosis. Neurologic:  Patient is alert, oriented, otherwise nonfocal.  LABORATORY DATA:  On 01/22/2011, the patient had a CBC performed which showed a white count of 7.3, hemoglobin 11.1,  hematocrit 33.2, platelets were 203,000.  Electrolytes were normal.  IMPRESSION AND PLAN:  A 60 year old female with: 1. Stage IIA, estrogen receptor-positive, progesterone receptor-     positive, HER-2/neu-positive invasive ductal carcinoma of the left     breast.  She is currently on Herceptin every 3 weeks as maintenance     for 1 year.  She will complete in January 2013.  She is also on     letrozole 2.5 mg daily, and duration of therapy will be 5 years. 2. Deep venous thrombosis of the right internal jugular vein due to     her previous Port-A-Cath.  The port has been removed.  She has a     PICC line in, and she is getting flushes and doing quite well with     it. 3. Thrombosis.  The patient's PT/INRs are being followed every week.     Her last INR was done on 10/26, and it was 2.2.  She will continue     her present dose of Coumadin, which is 5 mg daily.  She has 10 mg     tablets but she     is only taking half a tablet daily.  We will continue to follow her     INRs. 4. The patient's next Herceptin is due on 02/13/2011, and I will plan     on seeing her on November 20th.    ______________________________ Marcy Panning, M.D. KK/MEDQ  D:  01/30/2011  T:  01/30/2011  Job:  [id number]

## 2011-02-02 ENCOUNTER — Ambulatory Visit (HOSPITAL_BASED_OUTPATIENT_CLINIC_OR_DEPARTMENT_OTHER): Payer: Medicare Other

## 2011-02-02 ENCOUNTER — Other Ambulatory Visit: Payer: Self-pay

## 2011-02-02 DIAGNOSIS — Z452 Encounter for adjustment and management of vascular access device: Secondary | ICD-10-CM

## 2011-02-02 DIAGNOSIS — C50919 Malignant neoplasm of unspecified site of unspecified female breast: Secondary | ICD-10-CM

## 2011-02-02 MED ORDER — SODIUM CHLORIDE 0.9 % IJ SOLN
10.0000 mL | INTRAMUSCULAR | Status: DC | PRN
  Filled 2011-02-02: qty 10

## 2011-02-02 MED ORDER — HEPARIN SOD (PORK) LOCK FLUSH 100 UNIT/ML IV SOLN
500.0000 [IU] | Freq: Once | INTRAVENOUS | Status: AC
  Administered 2011-02-02: 500 [IU] via INTRAVENOUS
  Filled 2011-02-02: qty 5

## 2011-02-02 MED ORDER — SODIUM CHLORIDE 0.9 % IJ SOLN
10.0000 mL | INTRAMUSCULAR | Status: DC | PRN
  Administered 2011-02-02: 10 mL via INTRAVENOUS
  Filled 2011-02-02: qty 10

## 2011-02-04 ENCOUNTER — Ambulatory Visit (HOSPITAL_BASED_OUTPATIENT_CLINIC_OR_DEPARTMENT_OTHER): Payer: Medicare Other

## 2011-02-04 DIAGNOSIS — Z452 Encounter for adjustment and management of vascular access device: Secondary | ICD-10-CM

## 2011-02-04 DIAGNOSIS — C50919 Malignant neoplasm of unspecified site of unspecified female breast: Secondary | ICD-10-CM

## 2011-02-04 MED ORDER — SODIUM CHLORIDE 0.9 % IJ SOLN
10.0000 mL | INTRAMUSCULAR | Status: DC | PRN
  Administered 2011-02-04: 20 mL via INTRAVENOUS
  Filled 2011-02-04: qty 10

## 2011-02-04 MED ORDER — HEPARIN SOD (PORK) LOCK FLUSH 100 UNIT/ML IV SOLN
500.0000 [IU] | INTRAVENOUS | Status: DC | PRN
  Administered 2011-02-04: 500 [IU] via INTRAVENOUS
  Filled 2011-02-04: qty 5

## 2011-02-06 ENCOUNTER — Ambulatory Visit (HOSPITAL_BASED_OUTPATIENT_CLINIC_OR_DEPARTMENT_OTHER): Payer: Medicare Other

## 2011-02-06 DIAGNOSIS — Z452 Encounter for adjustment and management of vascular access device: Secondary | ICD-10-CM

## 2011-02-06 DIAGNOSIS — C50919 Malignant neoplasm of unspecified site of unspecified female breast: Secondary | ICD-10-CM

## 2011-02-06 MED ORDER — HEPARIN SOD (PORK) LOCK FLUSH 100 UNIT/ML IV SOLN
500.0000 [IU] | Freq: Once | INTRAVENOUS | Status: AC
  Administered 2011-02-06: 250 [IU] via INTRAVENOUS
  Filled 2011-02-06: qty 5

## 2011-02-06 MED ORDER — SODIUM CHLORIDE 0.9 % IJ SOLN
10.0000 mL | INTRAMUSCULAR | Status: DC | PRN
  Administered 2011-02-06 (×2): 10 mL via INTRAVENOUS
  Filled 2011-02-06: qty 10

## 2011-02-09 ENCOUNTER — Ambulatory Visit (HOSPITAL_BASED_OUTPATIENT_CLINIC_OR_DEPARTMENT_OTHER): Payer: Medicare Other

## 2011-02-09 DIAGNOSIS — C50919 Malignant neoplasm of unspecified site of unspecified female breast: Secondary | ICD-10-CM

## 2011-02-09 DIAGNOSIS — Z452 Encounter for adjustment and management of vascular access device: Secondary | ICD-10-CM

## 2011-02-09 MED ORDER — SODIUM CHLORIDE 0.9 % IJ SOLN
10.0000 mL | Freq: Once | INTRAMUSCULAR | Status: AC
  Administered 2011-02-09: 10 mL via INTRAVENOUS
  Filled 2011-02-09: qty 10

## 2011-02-09 MED ORDER — HEPARIN SOD (PORK) LOCK FLUSH 100 UNIT/ML IV SOLN
250.0000 [IU] | Freq: Once | INTRAVENOUS | Status: AC
  Administered 2011-02-09: 250 [IU] via INTRAVENOUS
  Filled 2011-02-09: qty 5

## 2011-02-10 ENCOUNTER — Ambulatory Visit (HOSPITAL_BASED_OUTPATIENT_CLINIC_OR_DEPARTMENT_OTHER): Payer: Medicare Other | Admitting: Oncology

## 2011-02-10 ENCOUNTER — Telehealth: Payer: Self-pay | Admitting: Oncology

## 2011-02-10 DIAGNOSIS — Z86718 Personal history of other venous thrombosis and embolism: Secondary | ICD-10-CM

## 2011-02-10 DIAGNOSIS — Z170 Estrogen receptor positive status [ER+]: Secondary | ICD-10-CM

## 2011-02-10 DIAGNOSIS — Z8673 Personal history of transient ischemic attack (TIA), and cerebral infarction without residual deficits: Secondary | ICD-10-CM

## 2011-02-10 DIAGNOSIS — C50919 Malignant neoplasm of unspecified site of unspecified female breast: Secondary | ICD-10-CM

## 2011-02-10 DIAGNOSIS — I82409 Acute embolism and thrombosis of unspecified deep veins of unspecified lower extremity: Secondary | ICD-10-CM

## 2011-02-10 NOTE — Progress Notes (Signed)
OFFICE PROGRESS NOTE  CC [name], MD [name], MD  Lester Ehrhardt, MD [place] [address]  DIAGNOSIS: 60 year old female with  #1 stage II A. ER/PR positive HER-2/neu positive invasive ductal carcinoma of the left breast diagnosed October 2011. Patient is status post bilateral mastectomies.  #2 status post right CVA.  #3 right internal jugular deep venous thrombosis secondary to her Port-A-Cath  #4 status post PICC line placement for ongoing Herceptin therapy.  PRIOR THERAPY:  #1 Status post bilateral mastectomy performed in October 2011  #2 The left breast: final pathology revealed infiltrating ductal carcinoma that was ER positive PR positive HER-2/neu positive. Right breast specimen only revealed benign breast tissue.  #3 patient received adjuvant chemotherapy consisting of weekly Taxotere carboplatinum and Herceptin for a total of 6 cycle.  #4 he was then begun on Herceptin every 3 weeks with letrozole 2.5 mg daily in May June 2012.  CURRENT THERAPY: Patient is here for her every 3 week Herceptin and continued dosing of letrozole 2.5 mg.  INTERVAL HISTORY: REANNON URBAN 60 y.o. female returns for a low visit today. Overall she seems to be doing well she is without any complaints. She is tolerating the Herceptin well. She is having ongoing monitoring of her heart function by Dr. Fabian November is tolerated the Herceptin again very were very well. She is on Coumadin she has no evidence of bleeding on Coumadin.  Her INRs have been very therapeutic on her present dose. I am very pleased with this. He denies any fevers chills night sweats headaches shortness of breath chest pains palpitations no myalgias or arthralgias. Remainder of the 10 point review of systems is negative.  MEDICAL HISTORY: Past Medical History  Diagnosis Date  . Depression     seasonal melancholia  . Hyperlipidemia   . Hypertension   . Arthritis    DJD, lumbar spondylosis  . Myopathy     not statin related, cause unknown   . Breast cancer   . Back pain   . Edema extremities     ALLERGIES:  is allergic to atorvastatin and rosuvastatin.  MEDICATIONS:  Current Outpatient Prescriptions  Medication Sig Dispense Refill  . albuterol (PROVENTIL,VENTOLIN) 90 MCG/ACT inhaler Inhale 2 puffs into the lungs every 4 (four) hours as needed.  17 g  6  . ferrous sulfate 325 (65 FE) MG tablet Take 325 mg by mouth 2 (two) times daily.       . Fluticasone-Salmeterol (ADVAIR DISKUS) 250-50 MCG/DOSE AEPB Inhale 1 puff into the lungs 2 (two) times daily.  1 each  6  . letrozole (FEMARA) 2.5 MG tablet Take 1 tablet by mouth Daily.      Marland Kitchen LORazepam (ATIVAN) 1 MG tablet Take 1 mg by mouth daily as needed. For anxiety       . montelukast (SINGULAIR) 10 MG tablet Take 1 tablet (10 mg total) by mouth at bedtime.  31 tablet  3  . Multiple Vitamin (MULTIVITAMIN) tablet Take 1 tablet by mouth daily.        Marland Kitchen NEXIUM 40 MG capsule Daily      . olmesartan (BENICAR) 40 MG tablet Take 1 tablet (40 mg total) by mouth daily.  31 tablet  6  . traMADol (ULTRAM) 50 MG tablet Take 1 tablet (50 mg total) by mouth every 6 (six) hours as needed.  60 tablet  3  . warfarin (COUMADIN) 10 MG tablet TAKE 1 TABLET BY MOUTH EVERY DAY  30 tablet  0   No current facility-administered medications for this visit.   Facility-Administered Medications Ordered in Other Visits  Medication Dose Route Frequency Provider Last Rate Last Dose  . sodium chloride 0.9 % injection 10 mL  10 mL Intravenous PRN Deatra Robinson, MD   10 mL at 01/26/11 1035  . sodium chloride 0.9 % injection 10 mL  10 mL Intravenous PRN Deatra Robinson, MD   10 mL at 02/02/11 1010  . sodium chloride 0.9 % injection 10 mL  10 mL Intravenous PRN Deatra Robinson, MD        SURGICAL HISTORY:  Past Surgical History  Procedure Date  . Abdominal hysterectomy     for fibroids   . Laminectomy     C5-6 Dr. Louanne Skye 2005     . Oophorectomy   . Mastectomy     bilateral     REVIEW OF SYSTEMS:  Pertinent items are noted in HPI.   PHYSICAL EXAMINATION: General appearance: alert, cooperative and appears stated age Neck: no adenopathy, no carotid bruit, no JVD, supple, symmetrical, trachea midline and thyroid not enlarged, symmetric, no tenderness/mass/nodules Lymph nodes: Cervical, supraclavicular, and axillary nodes normal. Resp: clear to auscultation bilaterally and normal percussion bilaterally Back: symmetric, no curvature. ROM normal. No CVA tenderness. Cardio: regular rate and rhythm, S1, S2 normal, no murmur, click, rub or gallop and normal apical impulse GI: soft, non-tender; bowel sounds normal; no masses,  no organomegaly Extremities: extremities normal, atraumatic, no cyanosis or edema Neurologic: Alert and oriented X 3, normal strength and tone. Normal symmetric reflexes. Normal coordination and gait Mental status: Alert, oriented, thought content appropriate Sensory: normal Motor: grossly normal Reflexes: 2+ and symmetric   ECOG PERFORMANCE STATUS: 0 - Asymptomatic  Blood pressure 170/105, pulse 83, temperature 98.4 F (36.9 C), weight 232 lb (105.235 kg), last menstrual period 12/31/1968.  LABORATORY DATA: Lab Results  Component Value Date   WBC 7.3 01/22/2011   HGB 11.1* 01/22/2011   HCT 33.2* 01/22/2011   MCV 82.6 01/22/2011   PLT 203 01/22/2011      Chemistry      Component Value Date/Time   NA 141 01/22/2011 1453   NA 141 01/22/2011 1453   K 3.7 01/22/2011 1453   K 3.7 01/22/2011 1453   CL 106 01/22/2011 1453   CL 106 01/22/2011 1453   CO2 24 01/22/2011 1453   CO2 24 01/22/2011 1453   BUN 23 01/22/2011 1453   BUN 23 01/22/2011 1453   CREATININE 1.38* 01/22/2011 1453   CREATININE 1.38* 01/22/2011 1453   CREATININE 1.23* 01/01/2011 1549      Component Value Date/Time   CALCIUM 10.1 01/22/2011 1453   CALCIUM 10.1 01/22/2011 1453   ALKPHOS 115 01/22/2011 1453   ALKPHOS 115 01/22/2011 1453    AST 20 01/22/2011 1453   AST 20 01/22/2011 1453   ALT 24 01/22/2011 1453   ALT 24 01/22/2011 1453   BILITOT 0.2* 01/22/2011 1453   BILITOT 0.2* 01/22/2011 1453       RADIOGRAPHIC STUDIES:  No results found.  ASSESSMENT: 60 year old female with stage II a invasive ductal carcinoma of the left breast diagnosed in October 2011. She was found to have a ER/PR positive HER-2/neu positive breast cancer. She underwent bilateral mastectomies. Her right mastectomy was an elective prophylactic mastectomy due to her on comfort. Patient has also had right cerebrovascular accident. And she has had a deep venous thrombosis of the right internal jugular vein due to due to her Port-A-Cath.  She currently has a PIC line in place.   PLAN: Continue her Herceptin every 3 weeks. She will also continue the letrozole. Her Herceptin and is due to come be completed towards February 2013. She understands this. Thereafter she will continue the letrozole for a total of 5 years. Visual continue her Coumadin for at least 9 months or so. She completes this then we'll continue her on an aspirin due to her history of CVA. She'll be seen back in  3 weeks' time for followup and next visit and Herceptin.   All questions were answered. The patient knows to call the clinic with any problems, questions or concerns. We can certainly see the patient much sooner if necessary.  I spent 20 minutes counseling the patient face to face. The total time spent in the appointment was 30 minutes.    Marcy Panning, MD Medical/Oncology [facility] [phone] (beeper) [phone] (Office)  02/10/2011, 2:14 PM

## 2011-02-10 NOTE — Telephone Encounter (Signed)
Gv pt appt for [id number]

## 2011-02-11 ENCOUNTER — Other Ambulatory Visit: Payer: Self-pay | Admitting: Oncology

## 2011-02-11 ENCOUNTER — Other Ambulatory Visit: Payer: Self-pay | Admitting: *Deleted

## 2011-02-11 ENCOUNTER — Other Ambulatory Visit: Payer: Self-pay | Admitting: Physician Assistant

## 2011-02-11 ENCOUNTER — Telehealth: Payer: Self-pay | Admitting: *Deleted

## 2011-02-11 ENCOUNTER — Ambulatory Visit (HOSPITAL_BASED_OUTPATIENT_CLINIC_OR_DEPARTMENT_OTHER): Payer: Medicare Other

## 2011-02-11 DIAGNOSIS — Z452 Encounter for adjustment and management of vascular access device: Secondary | ICD-10-CM

## 2011-02-11 DIAGNOSIS — C50919 Malignant neoplasm of unspecified site of unspecified female breast: Secondary | ICD-10-CM

## 2011-02-11 MED ORDER — SODIUM CHLORIDE 0.9 % IJ SOLN
10.0000 mL | Freq: Once | INTRAMUSCULAR | Status: AC
  Administered 2011-02-11: 10 mL via INTRAVENOUS
  Filled 2011-02-11: qty 10

## 2011-02-11 MED ORDER — HEPARIN SOD (PORK) LOCK FLUSH 100 UNIT/ML IV SOLN
500.0000 [IU] | Freq: Once | INTRAVENOUS | Status: AC
  Administered 2011-02-11: 250 [IU] via INTRAVENOUS
  Filled 2011-02-11: qty 5

## 2011-02-11 MED ORDER — AZITHROMYCIN 250 MG PO TABS: ORAL_TABLET | ORAL | Status: AC

## 2011-02-11 NOTE — Patient Instructions (Signed)
Call MD for problems 

## 2011-02-13 ENCOUNTER — Other Ambulatory Visit (HOSPITAL_BASED_OUTPATIENT_CLINIC_OR_DEPARTMENT_OTHER): Payer: Medicare Other | Admitting: Lab

## 2011-02-13 ENCOUNTER — Other Ambulatory Visit: Payer: Self-pay | Admitting: Oncology

## 2011-02-13 ENCOUNTER — Ambulatory Visit (HOSPITAL_BASED_OUTPATIENT_CLINIC_OR_DEPARTMENT_OTHER): Payer: Medicare Other

## 2011-02-13 VITALS — BP 160/90 | HR 86 | Temp 97.6°F

## 2011-02-13 DIAGNOSIS — C50919 Malignant neoplasm of unspecified site of unspecified female breast: Secondary | ICD-10-CM

## 2011-02-13 DIAGNOSIS — Z5112 Encounter for antineoplastic immunotherapy: Secondary | ICD-10-CM

## 2011-02-13 LAB — COMPREHENSIVE METABOLIC PANEL
ALT: 19 U/L (ref 0–35)
AST: 18 U/L (ref 0–37)
Albumin: 4 g/dL (ref 3.5–5.2)
Alkaline Phosphatase: 74 U/L (ref 39–117)
BUN: 20 mg/dL (ref 6–23)
CO2: 24 mEq/L (ref 19–32)
Calcium: 9 mg/dL (ref 8.4–10.5)
Chloride: 105 mEq/L (ref 96–112)
Creatinine, Ser: 1.21 mg/dL — ABNORMAL HIGH (ref 0.50–1.10)
Glucose, Bld: 117 mg/dL — ABNORMAL HIGH (ref 70–99)
Potassium: 4 mEq/L (ref 3.5–5.3)
Sodium: 142 mEq/L (ref 135–145)
Total Bilirubin: 0.7 mg/dL (ref 0.3–1.2)
Total Protein: 6.7 g/dL (ref 6.0–8.3)

## 2011-02-13 LAB — CBC WITH DIFFERENTIAL/PLATELET
BASO%: 0.5 % (ref 0.0–2.0)
Basophils Absolute: 0 10*3/uL (ref 0.0–0.1)
EOS%: 5.5 % (ref 0.0–7.0)
Eosinophils Absolute: 0.5 10*3/uL (ref 0.0–0.5)
HCT: 31.9 % — ABNORMAL LOW (ref 34.8–46.6)
HGB: 10.7 g/dL — ABNORMAL LOW (ref 11.6–15.9)
LYMPH%: 28.9 % (ref 14.0–49.7)
MCH: 27.7 pg (ref 25.1–34.0)
MCHC: 33.5 g/dL (ref 31.5–36.0)
MCV: 82.6 fL (ref 79.5–101.0)
MONO#: 0.5 10*3/uL (ref 0.1–0.9)
MONO%: 6.3 % (ref 0.0–14.0)
NEUT#: 5 10*3/uL (ref 1.5–6.5)
NEUT%: 58.8 % (ref 38.4–76.8)
Platelets: 210 10*3/uL (ref 145–400)
RBC: 3.86 10*6/uL (ref 3.70–5.45)
RDW: 13.9 % (ref 11.2–14.5)
WBC: 8.6 10*3/uL (ref 3.9–10.3)
lymph#: 2.5 10*3/uL (ref 0.9–3.3)
nRBC: 0 % (ref 0–0)

## 2011-02-13 MED ORDER — SODIUM CHLORIDE 0.9 % IV SOLN
6.0000 mg/kg | Freq: Once | INTRAVENOUS | Status: AC
  Administered 2011-02-13: 630 mg via INTRAVENOUS
  Filled 2011-02-13: qty 30

## 2011-02-13 MED ORDER — HEPARIN SOD (PORK) LOCK FLUSH 100 UNIT/ML IV SOLN
500.0000 [IU] | Freq: Once | INTRAVENOUS | Status: AC | PRN
  Administered 2011-02-13: 250 [IU]
  Filled 2011-02-13: qty 5

## 2011-02-13 MED ORDER — SODIUM CHLORIDE 0.9 % IJ SOLN
10.0000 mL | INTRAMUSCULAR | Status: DC | PRN
  Administered 2011-02-13: 10 mL
  Filled 2011-02-13: qty 10

## 2011-02-13 MED ORDER — SODIUM CHLORIDE 0.9 % IV SOLN
Freq: Once | INTRAVENOUS | Status: AC
  Administered 2011-02-13: 09:00:00 via INTRAVENOUS

## 2011-02-13 MED ORDER — DIPHENHYDRAMINE HCL 25 MG PO CAPS
50.0000 mg | ORAL_CAPSULE | Freq: Once | ORAL | Status: AC
  Administered 2011-02-13: 50 mg via ORAL

## 2011-02-13 MED ORDER — ACETAMINOPHEN 325 MG PO TABS
650.0000 mg | ORAL_TABLET | Freq: Once | ORAL | Status: AC
  Administered 2011-02-13: 650 mg via ORAL

## 2011-02-16 ENCOUNTER — Ambulatory Visit (HOSPITAL_BASED_OUTPATIENT_CLINIC_OR_DEPARTMENT_OTHER): Payer: Medicare Other

## 2011-02-16 DIAGNOSIS — C50919 Malignant neoplasm of unspecified site of unspecified female breast: Secondary | ICD-10-CM

## 2011-02-16 DIAGNOSIS — Z452 Encounter for adjustment and management of vascular access device: Secondary | ICD-10-CM

## 2011-02-16 MED ORDER — HEPARIN SOD (PORK) LOCK FLUSH 100 UNIT/ML IV SOLN
500.0000 [IU] | Freq: Once | INTRAVENOUS | Status: AC
  Administered 2011-02-16: 500 [IU] via INTRAVENOUS
  Filled 2011-02-16: qty 5

## 2011-02-16 MED ORDER — SODIUM CHLORIDE 0.9 % IJ SOLN
10.0000 mL | Freq: Once | INTRAMUSCULAR | Status: AC
Start: 2011-02-16 — End: 2011-02-16
  Administered 2011-02-16: 10 mL via INTRAVENOUS
  Filled 2011-02-16: qty 10

## 2011-02-16 NOTE — Patient Instructions (Signed)
Call MD for problems 

## 2011-02-17 ENCOUNTER — Ambulatory Visit (HOSPITAL_COMMUNITY)
Admission: RE | Admit: 2011-02-17 | Discharge: 2011-02-17 | Disposition: A | Discharge status: Home / Self Care | Payer: Medicare Other | Source: Ambulatory Visit | Attending: Internal Medicine | Admitting: Internal Medicine

## 2011-02-17 VITALS — BP 156/100 | HR 89 | Wt 228.8 lb

## 2011-02-17 DIAGNOSIS — R609 Edema, unspecified: Secondary | ICD-10-CM

## 2011-02-17 DIAGNOSIS — C50919 Malignant neoplasm of unspecified site of unspecified female breast: Secondary | ICD-10-CM

## 2011-02-17 DIAGNOSIS — I10 Essential (primary) hypertension: Secondary | ICD-10-CM

## 2011-02-17 DIAGNOSIS — I519 Heart disease, unspecified: Secondary | ICD-10-CM

## 2011-02-17 MED ORDER — SPIRONOLACTONE 25 MG PO TABS: 25.0000 mg | ORAL_TABLET | Freq: Every day | ORAL | Status: DC

## 2011-02-17 NOTE — Progress Notes (Signed)
HPI: [name] is a 60 y/o women with h/o obesity, HTN,asthma and HL. Referred by Dr. Humphrey Rolls for management of cardiac status during Herceptin therapy (started May 2012)  Found to have L ductal breast CA. ER/PR +. HER 2 neu was equivocal. s/p bilateral mastectomies in 11/11. Lymph nodes negative.    Echo April 10, 2010 EF 55-60%. Grade 1 diastolic dusfunction. MUGA 2/12 EF 73%  Echo April 9,2012 EF 60-65% lat s' 10.43 Echo 12/13/10 60-65% poor windows for lat s' peak I see is 8.09  Was admitted in April 2012, with CVA manifested by LUE dyscoordination. CVA confirmed by MRI. Now resolved.   At last visit in September we changed HCTZ to lasix for longstanding LE edema (since her daughter was born in 55).   Some improvement with LE edema but it continues. Remains with chronic DOE on stairs. No dyspnea on flat ground. Weight stable. Missed several treatments of Herceptin due to clot in port-a-cath. Now has PICC line. Now back on Herceptin. Scheduled to complete treatment in February.   BP has been quite high.   ROS: All systems negative except as listed in HPI, PMH and Problem List.  Past Medical History  Diagnosis Date  . Depression     seasonal melancholia  . Hyperlipidemia   . Hypertension   . Arthritis     DJD, lumbar spondylosis  . Myopathy     not statin related, cause unknown   . Breast cancer   . Back pain   . Edema extremities     Current Outpatient Prescriptions  Medication Sig Dispense Refill  . albuterol (PROVENTIL,VENTOLIN) 90 MCG/ACT inhaler Inhale 2 puffs into the lungs every 4 (four) hours as needed.  17 g  6  . ferrous sulfate 325 (65 FE) MG tablet Take 325 mg by mouth 3 (three) times daily with meals.       . Fluticasone-Salmeterol (ADVAIR DISKUS) 250-50 MCG/DOSE AEPB Inhale 1 puff into the lungs 2 (two) times daily.  1 each  6  . furosemide (LASIX) 40 MG tablet Take 40 mg by mouth daily.        Marland Kitchen letrozole (FEMARA) 2.5 MG tablet Take 1 tablet by mouth Daily.        Marland Kitchen LORazepam (ATIVAN) 1 MG tablet Take 1 mg by mouth daily as needed. For anxiety       . Multiple Vitamin (MULTIVITAMIN) tablet Take 1 tablet by mouth daily.        Marland Kitchen NEXIUM 40 MG capsule Daily      . olmesartan (BENICAR) 40 MG tablet Take 1 tablet (40 mg total) by mouth daily.  31 tablet  6  . traMADol (ULTRAM) 50 MG tablet Take 1 tablet (50 mg total) by mouth every 6 (six) hours as needed.  60 tablet  3  . warfarin (COUMADIN) 10 MG tablet TAKE 1 TABLET BY MOUTH EVERY DAY  30 tablet  0  . azithromycin (ZITHROMAX) 250 MG tablet USE AS DIRECTED  6 each  0   No current facility-administered medications for this encounter.   Facility-Administered Medications Ordered in Other Encounters  Medication Dose Route Frequency Provider Last Rate Last Dose  . sodium chloride 0.9 % injection 10 mL  10 mL Intravenous PRN Deatra Robinson, MD   10 mL at 01/26/11 1035  . sodium chloride 0.9 % injection 10 mL  10 mL Intravenous PRN Deatra Robinson, MD   10 mL at 02/02/11 1010  . sodium chloride 0.9 % injection 10  mL  10 mL Intravenous PRN Deatra Robinson, MD         PHYSICAL EXAM: Filed Vitals:   02/17/11 1024  BP: 136/88  Pulse: 89   General:  Well appearing. No resp difficulty HEENT: normal Neck: supple. JVP flat. Carotids 2+ bilaterally; no bruits. No lymphadenopathy or thryomegaly appreciated. Cor: PMI normal. Regular rate & rhythm. No rubs, murmurs. +s4S/p B mastectomies Lungs: clear Abdomen: soft, nontender, nondistended. No hepatosplenomegaly. No bruits or masses. Good bowel sounds. Extremities: no cyanosis, clubbing, rash. 1-2+ feet edema Neuro: alert & orientedx3, cranial nerves grossly intact. Moves all 4 extremities w/o difficulty. Affect pleasant.    ASSESSMENT & PLAN:

## 2011-02-17 NOTE — Assessment & Plan Note (Signed)
Likely venous insufficiency. Gave script for compression stockings.

## 2011-02-17 NOTE — Assessment & Plan Note (Signed)
Overall doing well. I reviewed echos today. EF preserved but tissue doppler windows poor so unable to reliably measure lateral wall s' velocity. Will continue Herceptin. Plan repeat echo at our office next week. Would prefer to avoid b-blockers unless clear problem as she has significant asthma.

## 2011-02-17 NOTE — Assessment & Plan Note (Signed)
BP elevated. Start spironolactone 25 daily. Watch potassium.

## 2011-02-17 NOTE — Patient Instructions (Addendum)
Start Spironolactone 25 mg daily  Labs next week at [facility] 02/24/11  Your physician has requested that you have an echocardiogram. Echocardiography is a painless test that uses sound waves to create images of your heart. It provides your doctor with information about the size and shape of your heart and how well your heart's chambers and valves are working. This procedure takes approximately one hour. There are no restrictions for this procedure.  Tuesday 02/24/11 AT 11:30 AM AT [facility]  Your physician recommends that you schedule a follow-up appointment in: 1 month.

## 2011-02-18 ENCOUNTER — Ambulatory Visit (HOSPITAL_BASED_OUTPATIENT_CLINIC_OR_DEPARTMENT_OTHER): Payer: Medicare Other

## 2011-02-18 DIAGNOSIS — C50919 Malignant neoplasm of unspecified site of unspecified female breast: Secondary | ICD-10-CM

## 2011-02-18 DIAGNOSIS — Z452 Encounter for adjustment and management of vascular access device: Secondary | ICD-10-CM

## 2011-02-18 MED ORDER — SODIUM CHLORIDE 0.9 % IJ SOLN
10.0000 mL | Freq: Once | INTRAMUSCULAR | Status: AC
  Administered 2011-02-18: 10 mL via INTRAVENOUS
  Filled 2011-02-18: qty 10

## 2011-02-18 MED ORDER — HEPARIN SOD (PORK) LOCK FLUSH 100 UNIT/ML IV SOLN
500.0000 [IU] | Freq: Once | INTRAVENOUS | Status: AC
  Administered 2011-02-18: 250 [IU] via INTRAVENOUS
  Filled 2011-02-18: qty 5

## 2011-02-18 NOTE — Patient Instructions (Signed)
Call MD for problems 

## 2011-02-20 ENCOUNTER — Ambulatory Visit (HOSPITAL_BASED_OUTPATIENT_CLINIC_OR_DEPARTMENT_OTHER): Payer: Medicare Other

## 2011-02-20 DIAGNOSIS — C50919 Malignant neoplasm of unspecified site of unspecified female breast: Secondary | ICD-10-CM

## 2011-02-20 DIAGNOSIS — Z452 Encounter for adjustment and management of vascular access device: Secondary | ICD-10-CM

## 2011-02-20 MED ORDER — HEPARIN SOD (PORK) LOCK FLUSH 100 UNIT/ML IV SOLN
500.0000 [IU] | Freq: Once | INTRAVENOUS | Status: AC
  Administered 2011-02-20: 500 [IU] via INTRAVENOUS
  Filled 2011-02-20: qty 5

## 2011-02-20 MED ORDER — SODIUM CHLORIDE 0.9 % IJ SOLN
10.0000 mL | INTRAMUSCULAR | Status: DC | PRN
  Administered 2011-02-20: 10 mL via INTRAVENOUS
  Filled 2011-02-20: qty 10

## 2011-02-20 NOTE — Progress Notes (Signed)
PICC flush. Each lumen flushed with 75mls NS and 250 units Heparin. Good blood return from each lumen.

## 2011-02-23 ENCOUNTER — Ambulatory Visit (HOSPITAL_BASED_OUTPATIENT_CLINIC_OR_DEPARTMENT_OTHER): Payer: Medicare Other

## 2011-02-23 VITALS — BP 145/82 | HR 78 | Temp 97.9°F

## 2011-02-23 DIAGNOSIS — C50919 Malignant neoplasm of unspecified site of unspecified female breast: Secondary | ICD-10-CM

## 2011-02-23 DIAGNOSIS — Z452 Encounter for adjustment and management of vascular access device: Secondary | ICD-10-CM

## 2011-02-23 MED ORDER — HEPARIN SOD (PORK) LOCK FLUSH 100 UNIT/ML IV SOLN
500.0000 [IU] | Freq: Once | INTRAVENOUS | Status: AC
  Administered 2011-02-23: 250 [IU] via INTRAVENOUS
  Filled 2011-02-23: qty 5

## 2011-02-23 MED ORDER — SODIUM CHLORIDE 0.9 % IJ SOLN
10.0000 mL | INTRAMUSCULAR | Status: DC | PRN
  Administered 2011-02-23: 10 mL via INTRAVENOUS
  Filled 2011-02-23: qty 10

## 2011-02-23 NOTE — Patient Instructions (Signed)
Call MD for problems 

## 2011-02-24 ENCOUNTER — Other Ambulatory Visit: Payer: Medicare Other | Admitting: *Deleted

## 2011-02-24 ENCOUNTER — Ambulatory Visit (HOSPITAL_COMMUNITY): Payer: Medicare Other | Attending: Cardiology

## 2011-02-24 DIAGNOSIS — I10 Essential (primary) hypertension: Secondary | ICD-10-CM | POA: Insufficient documentation

## 2011-02-24 DIAGNOSIS — I519 Heart disease, unspecified: Secondary | ICD-10-CM

## 2011-02-24 DIAGNOSIS — R609 Edema, unspecified: Secondary | ICD-10-CM | POA: Insufficient documentation

## 2011-02-24 DIAGNOSIS — Z8673 Personal history of transient ischemic attack (TIA), and cerebral infarction without residual deficits: Secondary | ICD-10-CM | POA: Insufficient documentation

## 2011-02-24 DIAGNOSIS — I079 Rheumatic tricuspid valve disease, unspecified: Secondary | ICD-10-CM | POA: Insufficient documentation

## 2011-02-24 DIAGNOSIS — J45909 Unspecified asthma, uncomplicated: Secondary | ICD-10-CM | POA: Insufficient documentation

## 2011-02-24 DIAGNOSIS — I428 Other cardiomyopathies: Secondary | ICD-10-CM | POA: Insufficient documentation

## 2011-02-24 DIAGNOSIS — I379 Nonrheumatic pulmonary valve disorder, unspecified: Secondary | ICD-10-CM | POA: Insufficient documentation

## 2011-02-24 DIAGNOSIS — I059 Rheumatic mitral valve disease, unspecified: Secondary | ICD-10-CM | POA: Insufficient documentation

## 2011-02-24 DIAGNOSIS — C50919 Malignant neoplasm of unspecified site of unspecified female breast: Secondary | ICD-10-CM | POA: Insufficient documentation

## 2011-02-25 ENCOUNTER — Ambulatory Visit (HOSPITAL_BASED_OUTPATIENT_CLINIC_OR_DEPARTMENT_OTHER): Payer: Medicare Other

## 2011-02-25 DIAGNOSIS — Z452 Encounter for adjustment and management of vascular access device: Secondary | ICD-10-CM

## 2011-02-25 DIAGNOSIS — C50919 Malignant neoplasm of unspecified site of unspecified female breast: Secondary | ICD-10-CM

## 2011-02-25 MED ORDER — HEPARIN SOD (PORK) LOCK FLUSH 100 UNIT/ML IV SOLN
500.0000 [IU] | Freq: Once | INTRAVENOUS | Status: AC
  Administered 2011-02-25: 250 [IU] via INTRAVENOUS
  Filled 2011-02-25: qty 5

## 2011-02-25 MED ORDER — SODIUM CHLORIDE 0.9 % IJ SOLN
10.0000 mL | INTRAMUSCULAR | Status: DC | PRN
  Administered 2011-02-25: 10 mL via INTRAVENOUS
  Filled 2011-02-25: qty 10

## 2011-02-25 NOTE — Patient Instructions (Signed)
Call MD for problems 

## 2011-02-26 ENCOUNTER — Ambulatory Visit (INDEPENDENT_AMBULATORY_CARE_PROVIDER_SITE_OTHER): Payer: Medicare Other | Admitting: Pulmonary Disease

## 2011-02-26 ENCOUNTER — Encounter: Payer: Self-pay | Admitting: Pulmonary Disease

## 2011-02-26 VITALS — BP 122/84 | HR 90 | Temp 98.4°F | Ht 68.0 in | Wt 230.8 lb

## 2011-02-26 DIAGNOSIS — K219 Gastro-esophageal reflux disease without esophagitis: Secondary | ICD-10-CM

## 2011-02-26 DIAGNOSIS — J45991 Cough variant asthma: Secondary | ICD-10-CM

## 2011-02-26 MED ORDER — AMOXICILLIN-POT CLAVULANATE 875-125 MG PO TABS: 1.0000 | ORAL_TABLET | Freq: Two times a day (BID) | ORAL | Status: AC

## 2011-02-26 MED ORDER — ALBUTEROL SULFATE (2.5 MG/3ML) 0.083% IN NEBU: 2.5000 mg | INHALATION_SOLUTION | Freq: Two times a day (BID) | RESPIRATORY_TRACT | Status: DC | PRN

## 2011-02-26 NOTE — Assessment & Plan Note (Signed)
Treat for sinusitis with augmentin 875 bid x 14 ds Empiric neb bid

## 2011-02-26 NOTE — Patient Instructions (Addendum)
We will provide you with a nebuliser & medication to take twice daily as needed Treat for sinuses with antibiotic x 14 days Take claritin daily x 1 month Report back in a  month

## 2011-02-26 NOTE — Progress Notes (Signed)
  Subjective:    Patient ID: [name], female    DOB: [birth year], 60 y.o.   MRN: [id number]  HPI 60/F, never smoker, referred for FU of cough variant asthma  She reports wheezing when lying down especially in spring & fall for the last 3 yrs when she was given a diagnosis of asthma. She takes advair & claims compliance during this season. Dyspnea is present on lcimbing stairs.  She is on chemotherapy for Breast cancer (Dr Humphrey Rolls) . Grade 1 diastolic dysfunction was noted by Dr Haroldine Laws on echo. She reports pedal edema & has been taking lasix daily. She has been on lisinopril for about 2 yrs.  CXR 1/12 no infiltrates, RT portocath  Reviewed spirometry 9/09 wnl & today -no obstruction. Methacholine challenge 9/09 drop in smaller airways  >> benicar instead of lisinopril, added singulair  02/26/2011 No better c/o wheezing worse at night. no cough. no relief with Singulair NO breakthrough reflux on nexium Requests nebuliser  Portocath complicated by RIJ thrombus Head CT 9/12 showed Ethmoid sinus mucosal thickening and bubbly opacity in the left sphenoid    Review of Systems Patient denies significant dyspnea,cough, hemoptysis,  chest pain, palpitations, pedal edema, orthopnea, paroxysmal nocturnal dyspnea, lightheadedness, nausea, vomiting, abdominal or  leg pains      Objective:   Physical Exam  Gen. Pleasant, well-nourished, in no distress ENT - no lesions, no post nasal drip Neck: No JVD, no thyromegaly, no carotid bruits Lungs: no use of accessory muscles, no dullness to percussion, clear without rales or rhonchi  Cardiovascular: Rhythm regular, heart sounds  normal, no murmurs or gallops, no peripheral edema Musculoskeletal: No deformities, no cyanosis or clubbing        Assessment & Plan:

## 2011-02-27 ENCOUNTER — Ambulatory Visit (HOSPITAL_BASED_OUTPATIENT_CLINIC_OR_DEPARTMENT_OTHER): Payer: Medicare Other

## 2011-02-27 DIAGNOSIS — C50919 Malignant neoplasm of unspecified site of unspecified female breast: Secondary | ICD-10-CM

## 2011-02-27 DIAGNOSIS — Z452 Encounter for adjustment and management of vascular access device: Secondary | ICD-10-CM

## 2011-02-27 MED ORDER — HEPARIN SOD (PORK) LOCK FLUSH 100 UNIT/ML IV SOLN
500.0000 [IU] | Freq: Once | INTRAVENOUS | Status: AC
  Administered 2011-02-27: 500 [IU] via INTRAVENOUS
  Filled 2011-02-27: qty 5

## 2011-02-27 MED ORDER — SODIUM CHLORIDE 0.9 % IJ SOLN
10.0000 mL | INTRAMUSCULAR | Status: DC | PRN
  Administered 2011-02-27: 10 mL via INTRAVENOUS
  Filled 2011-02-27: qty 10

## 2011-03-02 ENCOUNTER — Ambulatory Visit (HOSPITAL_BASED_OUTPATIENT_CLINIC_OR_DEPARTMENT_OTHER): Payer: Medicare Other

## 2011-03-02 DIAGNOSIS — C50919 Malignant neoplasm of unspecified site of unspecified female breast: Secondary | ICD-10-CM

## 2011-03-02 DIAGNOSIS — Z452 Encounter for adjustment and management of vascular access device: Secondary | ICD-10-CM

## 2011-03-02 MED ORDER — HEPARIN SOD (PORK) LOCK FLUSH 100 UNIT/ML IV SOLN
500.0000 [IU] | Freq: Once | INTRAVENOUS | Status: AC
  Administered 2011-03-02: 500 [IU] via INTRAVENOUS
  Filled 2011-03-02: qty 5

## 2011-03-02 MED ORDER — SODIUM CHLORIDE 0.9 % IJ SOLN
10.0000 mL | INTRAMUSCULAR | Status: DC | PRN
  Filled 2011-03-02: qty 10

## 2011-03-02 MED ORDER — SODIUM CHLORIDE 0.9 % IJ SOLN
10.0000 mL | INTRAMUSCULAR | Status: DC | PRN
  Administered 2011-03-02 (×2): 10 mL via INTRAVENOUS
  Filled 2011-03-02: qty 10

## 2011-03-02 NOTE — Progress Notes (Signed)
Used 2.5cc Heparin for PICC lumen flush.

## 2011-03-04 ENCOUNTER — Ambulatory Visit (HOSPITAL_BASED_OUTPATIENT_CLINIC_OR_DEPARTMENT_OTHER): Payer: Medicare Other

## 2011-03-04 DIAGNOSIS — Z452 Encounter for adjustment and management of vascular access device: Secondary | ICD-10-CM

## 2011-03-04 DIAGNOSIS — C50919 Malignant neoplasm of unspecified site of unspecified female breast: Secondary | ICD-10-CM

## 2011-03-04 MED ORDER — SODIUM CHLORIDE 0.9 % IJ SOLN
10.0000 mL | INTRAMUSCULAR | Status: DC | PRN
  Administered 2011-03-04: 10 mL via INTRAVENOUS
  Filled 2011-03-04: qty 10

## 2011-03-04 MED ORDER — HEPARIN SOD (PORK) LOCK FLUSH 100 UNIT/ML IV SOLN
500.0000 [IU] | Freq: Once | INTRAVENOUS | Status: AC
  Administered 2011-03-04: 250 [IU] via INTRAVENOUS
  Filled 2011-03-04: qty 5

## 2011-03-04 NOTE — Patient Instructions (Signed)
Call MD for problems 

## 2011-03-06 ENCOUNTER — Encounter: Payer: Self-pay | Admitting: Oncology

## 2011-03-06 ENCOUNTER — Other Ambulatory Visit (HOSPITAL_BASED_OUTPATIENT_CLINIC_OR_DEPARTMENT_OTHER): Payer: Medicare Other | Admitting: Lab

## 2011-03-06 ENCOUNTER — Ambulatory Visit (HOSPITAL_BASED_OUTPATIENT_CLINIC_OR_DEPARTMENT_OTHER): Payer: Medicare Other

## 2011-03-06 ENCOUNTER — Ambulatory Visit (HOSPITAL_BASED_OUTPATIENT_CLINIC_OR_DEPARTMENT_OTHER): Payer: Medicare Other | Admitting: Oncology

## 2011-03-06 ENCOUNTER — Other Ambulatory Visit: Payer: Self-pay | Admitting: Certified Registered Nurse Anesthetist

## 2011-03-06 DIAGNOSIS — C50919 Malignant neoplasm of unspecified site of unspecified female breast: Secondary | ICD-10-CM

## 2011-03-06 DIAGNOSIS — R05 Cough: Secondary | ICD-10-CM

## 2011-03-06 DIAGNOSIS — I82409 Acute embolism and thrombosis of unspecified deep veins of unspecified lower extremity: Secondary | ICD-10-CM

## 2011-03-06 DIAGNOSIS — Z5181 Encounter for therapeutic drug level monitoring: Secondary | ICD-10-CM

## 2011-03-06 DIAGNOSIS — Z9010 Acquired absence of unspecified breast and nipple: Secondary | ICD-10-CM

## 2011-03-06 DIAGNOSIS — R059 Cough, unspecified: Secondary | ICD-10-CM | POA: Insufficient documentation

## 2011-03-06 DIAGNOSIS — Z7901 Long term (current) use of anticoagulants: Secondary | ICD-10-CM

## 2011-03-06 DIAGNOSIS — Z5112 Encounter for antineoplastic immunotherapy: Secondary | ICD-10-CM

## 2011-03-06 DIAGNOSIS — Z09 Encounter for follow-up examination after completed treatment for conditions other than malignant neoplasm: Secondary | ICD-10-CM

## 2011-03-06 HISTORY — DX: Encounter for follow-up examination after completed treatment for conditions other than malignant neoplasm: Z09

## 2011-03-06 HISTORY — DX: Cough, unspecified: R05.9

## 2011-03-06 LAB — COMPREHENSIVE METABOLIC PANEL
ALT: 20 U/L (ref 0–35)
AST: 19 U/L (ref 0–37)
Albumin: 4.4 g/dL (ref 3.5–5.2)
Alkaline Phosphatase: 118 U/L — ABNORMAL HIGH (ref 39–117)
BUN: 24 mg/dL — ABNORMAL HIGH (ref 6–23)
CO2: 24 mEq/L (ref 19–32)
Calcium: 10.1 mg/dL (ref 8.4–10.5)
Chloride: 106 mEq/L (ref 96–112)
Creatinine, Ser: 1.54 mg/dL — ABNORMAL HIGH (ref 0.50–1.10)
Glucose, Bld: 103 mg/dL — ABNORMAL HIGH (ref 70–99)
Potassium: 4.6 mEq/L (ref 3.5–5.3)
Sodium: 142 mEq/L (ref 135–145)
Total Bilirubin: 0.3 mg/dL (ref 0.3–1.2)
Total Protein: 7.1 g/dL (ref 6.0–8.3)

## 2011-03-06 LAB — PROTIME-INR
INR: 1.9 — ABNORMAL LOW (ref 2.00–3.50)
Protime: 22.8 Seconds — ABNORMAL HIGH (ref 10.6–13.4)

## 2011-03-06 LAB — CBC WITH DIFFERENTIAL/PLATELET
BASO%: 0.8 % (ref 0.0–2.0)
Basophils Absolute: 0 10*3/uL (ref 0.0–0.1)
EOS%: 10.6 % — ABNORMAL HIGH (ref 0.0–7.0)
Eosinophils Absolute: 0.6 10*3/uL — ABNORMAL HIGH (ref 0.0–0.5)
HCT: 34.7 % — ABNORMAL LOW (ref 34.8–46.6)
HGB: 11.5 g/dL — ABNORMAL LOW (ref 11.6–15.9)
LYMPH%: 43.7 % (ref 14.0–49.7)
MCH: 27.6 pg (ref 25.1–34.0)
MCHC: 33.1 g/dL (ref 31.5–36.0)
MCV: 83.2 fL (ref 79.5–101.0)
MONO#: 0.2 10*3/uL (ref 0.1–0.9)
MONO%: 4 % (ref 0.0–14.0)
NEUT#: 2.1 10*3/uL (ref 1.5–6.5)
NEUT%: 40.9 % (ref 38.4–76.8)
Platelets: 223 10*3/uL (ref 145–400)
RBC: 4.17 10*6/uL (ref 3.70–5.45)
RDW: 13.9 % (ref 11.2–14.5)
WBC: 5.2 10*3/uL (ref 3.9–10.3)
lymph#: 2.3 10*3/uL (ref 0.9–3.3)
nRBC: 0 % (ref 0–0)

## 2011-03-06 MED ORDER — TRASTUZUMAB CHEMO INJECTION 440 MG
6.0000 mg/kg | Freq: Once | INTRAVENOUS | Status: AC
  Administered 2011-03-06: 630 mg via INTRAVENOUS
  Filled 2011-03-06: qty 30

## 2011-03-06 MED ORDER — HEPARIN SOD (PORK) LOCK FLUSH 100 UNIT/ML IV SOLN
250.0000 [IU] | Freq: Once | INTRAVENOUS | Status: AC | PRN
  Administered 2011-03-06: 250 [IU]
  Filled 2011-03-06: qty 5

## 2011-03-06 MED ORDER — DIPHENHYDRAMINE HCL 25 MG PO CAPS
50.0000 mg | ORAL_CAPSULE | Freq: Once | ORAL | Status: AC
  Administered 2011-03-06: 50 mg via ORAL

## 2011-03-06 MED ORDER — SODIUM CHLORIDE 0.9 % IJ SOLN
10.0000 mL | INTRAMUSCULAR | Status: DC | PRN
  Administered 2011-03-06: 10 mL
  Filled 2011-03-06: qty 10

## 2011-03-06 MED ORDER — ACETAMINOPHEN 325 MG PO TABS
650.0000 mg | ORAL_TABLET | Freq: Once | ORAL | Status: AC
  Administered 2011-03-06: 650 mg via ORAL

## 2011-03-06 MED ORDER — SODIUM CHLORIDE 0.9 % IV SOLN
Freq: Once | INTRAVENOUS | Status: AC
  Administered 2011-03-06: 10:00:00 via INTRAVENOUS

## 2011-03-06 NOTE — Progress Notes (Signed)
OFFICE PROGRESS NOTE  CC [name] Seltzer, MD [name], MD  Lester Soper, MD [place] [address]  DIAGNOSIS: 60 year old female with  #1 stage II A. ER/PR positive HER-2/neu positive invasive ductal carcinoma of the left breast diagnosed October 2011. Patient is status post bilateral mastectomies.  #2 status post right CVA.  #3 right internal jugular deep venous thrombosis secondary to her Port-A-Cath  #4 status post PICC line placement for ongoing Herceptin therapy.  PRIOR THERAPY:  #1 Status post bilateral mastectomy performed in October 2011  #2 The left breast: final pathology revealed infiltrating ductal carcinoma that was ER positive PR positive HER-2/neu positive. Right breast specimen only revealed benign breast tissue.  #3 patient received adjuvant chemotherapy consisting of weekly Taxotere carboplatinum and Herceptin for a total of 6 cycle.  #4 he was then begun on Herceptin every 3 weeks with letrozole 2.5 mg daily in May June 2012.  CURRENT THERAPY: Patient is here for her every 3 week Herceptin and continued dosing of letrozole 2.5 mg.  INTERVAL HISTORY: ABIR MORRISETTE 60 y.o. female returns for a low visit today. Overall she seems to be doing well she is without any complaints. She is tolerating the Herceptin well. She is having ongoing monitoring of her heart function by Dr. Fabian November is tolerated the Herceptin again very were very well. She is on Coumadin she has no evidence of bleeding on Coumadin.  Her INRs have been very therapeutic on her present dose. I am very pleased with this. He denies any fevers chills night sweats headaches shortness of breath chest pains palpitations no myalgias or arthralgias. Remainder of the 10 point review of systems is negative.  MEDICAL HISTORY: Past Medical History  Diagnosis Date  . Depression     seasonal melancholia  . Hyperlipidemia   . Hypertension   . Arthritis    DJD, lumbar spondylosis  . Myopathy     not statin related, cause unknown   . Breast cancer   . Back pain   . Edema extremities   . Chemotherapy follow-up examination 03/06/2011  . Cough 03/06/2011    ALLERGIES:  is allergic to atorvastatin and rosuvastatin.  MEDICATIONS:  Current Outpatient Prescriptions  Medication Sig Dispense Refill  . albuterol (PROVENTIL) (2.5 MG/3ML) 0.083% nebulizer solution Take 3 mLs (2.5 mg total) by nebulization 2 (two) times daily as needed for wheezing.  60 mL  2  . albuterol (PROVENTIL,VENTOLIN) 90 MCG/ACT inhaler Inhale 2 puffs into the lungs every 4 (four) hours as needed.  17 g  6  . amoxicillin-clavulanate (AUGMENTIN) 875-125 MG per tablet Take 1 tablet by mouth 2 (two) times daily.  28 tablet  0  . ferrous sulfate 325 (65 FE) MG tablet Take 325 mg by mouth 3 (three) times daily with meals.       . Fluticasone-Salmeterol (ADVAIR DISKUS) 250-50 MCG/DOSE AEPB Inhale 1 puff into the lungs 2 (two) times daily.  1 each  6  . furosemide (LASIX) 40 MG tablet Take 40 mg by mouth daily.        Marland Kitchen KLOR-CON M20 20 MEQ tablet Take 1 tablet by mouth daily.      Marland Kitchen letrozole (FEMARA) 2.5 MG tablet Take 1 tablet by mouth Daily.      Marland Kitchen LORazepam (ATIVAN) 1 MG tablet Take 1 mg by mouth daily as needed. For anxiety       . Multiple Vitamin (MULTIVITAMIN) tablet Take 1 tablet by mouth daily.        Marland Kitchen  NEXIUM 40 MG capsule Daily      . olmesartan (BENICAR) 40 MG tablet Take 1 tablet (40 mg total) by mouth daily.  31 tablet  6  . spironolactone (ALDACTONE) 25 MG tablet Take 1 tablet (25 mg total) by mouth daily.  30 tablet  6  . traMADol (ULTRAM) 50 MG tablet Take 1 tablet (50 mg total) by mouth every 6 (six) hours as needed.  60 tablet  3  . warfarin (COUMADIN) 10 MG tablet TAKE 1 TABLET BY MOUTH EVERY DAY  30 tablet  0   No current facility-administered medications for this visit.   Facility-Administered Medications Ordered in Other Visits  Medication Dose Route  Frequency Provider Last Rate Last Dose  . sodium chloride 0.9 % injection 10 mL  10 mL Intravenous PRN Deatra Robinson, MD   10 mL at 01/26/11 1035    SURGICAL HISTORY:  Past Surgical History  Procedure Date  . Abdominal hysterectomy     for fibroids   . Laminectomy     C5-6 Dr. Louanne Skye 2005   . Oophorectomy   . Mastectomy     bilateral     REVIEW OF SYSTEMS:  Pertinent items are noted in HPI.   PHYSICAL EXAMINATION: General appearance: alert, cooperative and appears stated age Neck: no adenopathy, no carotid bruit, no JVD, supple, symmetrical, trachea midline and thyroid not enlarged, symmetric, no tenderness/mass/nodules Lymph nodes: Cervical, supraclavicular, and axillary nodes normal. Resp: clear to auscultation bilaterally and normal percussion bilaterally Back: symmetric, no curvature. ROM normal. No CVA tenderness. Cardio: regular rate and rhythm, S1, S2 normal, no murmur, click, rub or gallop and normal apical impulse GI: soft, non-tender; bowel sounds normal; no masses,  no organomegaly Extremities: extremities normal, atraumatic, no cyanosis or edema Neurologic: Alert and oriented X 3, normal strength and tone. Normal symmetric reflexes. Normal coordination and gait Mental status: Alert, oriented, thought content appropriate Sensory: normal Motor: grossly normal Reflexes: 2+ and symmetric   ECOG PERFORMANCE STATUS: 0 - Asymptomatic  Blood pressure 186/79, pulse 90, temperature 98.1 F (36.7 C), temperature source Oral, height 5\' 8"  (1.727 m), weight 231 lb 14.4 oz (105.189 kg), last menstrual period 12/31/1968.  LABORATORY DATA: Lab Results  Component Value Date   WBC 5.2 03/06/2011   HGB 11.5* 03/06/2011   HCT 34.7* 03/06/2011   MCV 83.2 03/06/2011   PLT 223 03/06/2011      Chemistry      Component Value Date/Time   NA 142 02/13/2011 0811   K 4.0 02/13/2011 0811   CL 105 02/13/2011 0811   CO2 24 02/13/2011 0811   BUN 20 02/13/2011 0811   CREATININE  1.21* 02/13/2011 0811   CREATININE 1.23* 01/01/2011 1549      Component Value Date/Time   CALCIUM 9.0 02/13/2011 0811   ALKPHOS 74 02/13/2011 0811   AST 18 02/13/2011 0811   ALT 19 02/13/2011 0811   BILITOT 0.7 02/13/2011 0811       RADIOGRAPHIC STUDIES:  No results found.  ASSESSMENT: 60 year old female with stage II a invasive ductal carcinoma of the left breast diagnosed in October 2011. She was found to have a ER/PR positive HER-2/neu positive breast cancer. She underwent bilateral mastectomies. Her right mastectomy was an elective prophylactic mastectomy due to her on comfort. Patient has also had right cerebrovascular accident. And she has had a deep venous thrombosis of the right internal jugular vein due to due to her Port-A-Cath. She currently has a PIC line in place.  INR is subtherapeutic today and we will increase her dose of Coumadin to 10 mg Monday Wednesday Friday and 5 mg other days.  PLAN: Continue her Herceptin every 3 weeks. She will also continue the letrozole. Her Herceptin and is due to come be completed towards February 2013. She understands this. Thereafter she will continue the letrozole for a total of 5 years. Visual continue her Coumadin for at least 9 months or so. She completes this then we'll continue her on an aspirin due to her history of CVA. She'll be seen back in  3 weeks' time for followup and next visit and Herceptin. Coumadin adjustment as needed with INR being checked in one week's time she will return in 3 weeks' time for followup and her next dose of Herceptin on 03/26/2010.  All questions were answered. The patient knows to call the clinic with any problems, questions or concerns. We can certainly see the patient much sooner if necessary.  I spent 20 minutes counseling the patient face to face. The total time spent in the appointment was 30 minutes.    Marcy Panning, MD Medical/Oncology [facility] [phone]  (beeper) [phone] (Office)  03/06/2011, 9:49 AM

## 2011-03-08 NOTE — Assessment & Plan Note (Signed)
Seemingly controlled on nexium

## 2011-03-09 ENCOUNTER — Ambulatory Visit (HOSPITAL_BASED_OUTPATIENT_CLINIC_OR_DEPARTMENT_OTHER): Payer: Medicare Other

## 2011-03-09 DIAGNOSIS — C50919 Malignant neoplasm of unspecified site of unspecified female breast: Secondary | ICD-10-CM

## 2011-03-09 DIAGNOSIS — Z452 Encounter for adjustment and management of vascular access device: Secondary | ICD-10-CM

## 2011-03-09 MED ORDER — SODIUM CHLORIDE 0.9 % IJ SOLN
10.0000 mL | INTRAMUSCULAR | Status: DC | PRN
  Administered 2011-03-09: 10 mL via INTRAVENOUS
  Filled 2011-03-09: qty 10

## 2011-03-09 MED ORDER — HEPARIN SOD (PORK) LOCK FLUSH 100 UNIT/ML IV SOLN
500.0000 [IU] | Freq: Once | INTRAVENOUS | Status: AC
  Administered 2011-03-09: 500 [IU] via INTRAVENOUS
  Filled 2011-03-09: qty 5

## 2011-03-11 ENCOUNTER — Ambulatory Visit (HOSPITAL_BASED_OUTPATIENT_CLINIC_OR_DEPARTMENT_OTHER): Payer: Medicare Other

## 2011-03-11 DIAGNOSIS — Z469 Encounter for fitting and adjustment of unspecified device: Secondary | ICD-10-CM

## 2011-03-11 DIAGNOSIS — C50919 Malignant neoplasm of unspecified site of unspecified female breast: Secondary | ICD-10-CM

## 2011-03-11 MED ORDER — SODIUM CHLORIDE 0.9 % IJ SOLN
10.0000 mL | INTRAMUSCULAR | Status: DC | PRN
  Administered 2011-03-11: 10 mL via INTRAVENOUS
  Filled 2011-03-11: qty 10

## 2011-03-11 MED ORDER — HEPARIN SOD (PORK) LOCK FLUSH 100 UNIT/ML IV SOLN
500.0000 [IU] | Freq: Once | INTRAVENOUS | Status: AC
  Administered 2011-03-11: 500 [IU] via INTRAVENOUS
  Filled 2011-03-11: qty 5

## 2011-03-13 ENCOUNTER — Other Ambulatory Visit: Payer: Self-pay | Admitting: Oncology

## 2011-03-13 ENCOUNTER — Telehealth: Payer: Self-pay | Admitting: *Deleted

## 2011-03-13 ENCOUNTER — Other Ambulatory Visit (HOSPITAL_BASED_OUTPATIENT_CLINIC_OR_DEPARTMENT_OTHER): Payer: Medicare Other | Admitting: Lab

## 2011-03-13 ENCOUNTER — Ambulatory Visit (HOSPITAL_BASED_OUTPATIENT_CLINIC_OR_DEPARTMENT_OTHER): Payer: Medicare Other

## 2011-03-13 DIAGNOSIS — I82409 Acute embolism and thrombosis of unspecified deep veins of unspecified lower extremity: Secondary | ICD-10-CM

## 2011-03-13 DIAGNOSIS — C50919 Malignant neoplasm of unspecified site of unspecified female breast: Secondary | ICD-10-CM

## 2011-03-13 DIAGNOSIS — Z469 Encounter for fitting and adjustment of unspecified device: Secondary | ICD-10-CM

## 2011-03-13 DIAGNOSIS — K2970 Gastritis, unspecified, without bleeding: Secondary | ICD-10-CM

## 2011-03-13 LAB — PROTIME-INR
INR: 3.4 (ref 2.00–3.50)
Protime: 40.8 Seconds — ABNORMAL HIGH (ref 10.6–13.4)

## 2011-03-13 MED ORDER — HEPARIN SOD (PORK) LOCK FLUSH 100 UNIT/ML IV SOLN
500.0000 [IU] | Freq: Once | INTRAVENOUS | Status: AC
  Administered 2011-03-13: 250 [IU] via INTRAVENOUS
  Filled 2011-03-13: qty 5

## 2011-03-13 MED ORDER — SODIUM CHLORIDE 0.9 % IJ SOLN
10.0000 mL | INTRAMUSCULAR | Status: DC | PRN
  Administered 2011-03-13: 10 mL via INTRAVENOUS
  Filled 2011-03-13: qty 10

## 2011-03-13 NOTE — Telephone Encounter (Signed)
Pt instructed to take 5mg  daily per MD. Pt verbalized understanding

## 2011-03-13 NOTE — Patient Instructions (Signed)
Call MD for problems 

## 2011-03-15 ENCOUNTER — Emergency Department (HOSPITAL_COMMUNITY): Payer: Medicare Other

## 2011-03-15 ENCOUNTER — Emergency Department (HOSPITAL_COMMUNITY)
Admission: EM | Admit: 2011-03-15 | Discharge: 2011-03-16 | Disposition: A | Discharge status: Home / Self Care | Payer: Medicare Other | Attending: Emergency Medicine | Admitting: Emergency Medicine

## 2011-03-15 ENCOUNTER — Encounter (HOSPITAL_COMMUNITY): Payer: Self-pay | Admitting: *Deleted

## 2011-03-15 DIAGNOSIS — F3289 Other specified depressive episodes: Secondary | ICD-10-CM | POA: Insufficient documentation

## 2011-03-15 DIAGNOSIS — R51 Headache: Secondary | ICD-10-CM | POA: Insufficient documentation

## 2011-03-15 DIAGNOSIS — Z79899 Other long term (current) drug therapy: Secondary | ICD-10-CM | POA: Insufficient documentation

## 2011-03-15 DIAGNOSIS — Z7901 Long term (current) use of anticoagulants: Secondary | ICD-10-CM | POA: Insufficient documentation

## 2011-03-15 DIAGNOSIS — F329 Major depressive disorder, single episode, unspecified: Secondary | ICD-10-CM | POA: Insufficient documentation

## 2011-03-15 DIAGNOSIS — E785 Hyperlipidemia, unspecified: Secondary | ICD-10-CM | POA: Insufficient documentation

## 2011-03-15 DIAGNOSIS — Z853 Personal history of malignant neoplasm of breast: Secondary | ICD-10-CM | POA: Insufficient documentation

## 2011-03-15 DIAGNOSIS — I10 Essential (primary) hypertension: Secondary | ICD-10-CM | POA: Insufficient documentation

## 2011-03-15 DIAGNOSIS — M129 Arthropathy, unspecified: Secondary | ICD-10-CM | POA: Insufficient documentation

## 2011-03-15 NOTE — ED Notes (Addendum)
C/o R posterior head pain & popping, onset 2 weeks ago, worse in last 2d, BP noted to be high, some popping when turning head. Denies neck pain or issues. Denies hearing changes. Reports some visual changes "blurriness". (Denies: nv, fever, dizziness, numbness, tingling or weakness).  PERRL 88mm brisk. Alert, NAD, calm interactive speaking in clear complete sentences, resps e/u. Is being seen at [facility] onc center for PICC line flushes.

## 2011-03-15 NOTE — ED Notes (Addendum)
Patient complaining of hearing a "popping" in her ears when she turns her head from side to side (states that this has been going on for two weeks.  Patient complaining of pain in the right temporal area past couple of days (denies pain in neck); describes pain as "brief" (lasts for about five seconds); describes pain as "sharp".  Patient states that the pain occurs intermittently, but that the pain has been "pretty constant" today.  Reports blurred vision from time to time; denies numbness/tingling in hands/feet, dizziness, problems with ambulation, respiratory symptoms, and chest pain.  Patient had a bone spur with surgery on her neck around give years ago, but states that she has not had any problems with this.  Patient has PICC line due to breast cancer.  Patient did have a port; was removed due to blood clot formation.  PICC line has been placed to replace port.  Patient denies any problems with her PICC line at this time.  Upon arrival to room, patient changed into gown.  Patient alert and oriented x4; calm and follows commands.  PERRL present.  Hand grips and foot pushes are bilaterally equal and strong.  No sensory deficits noted.  Resident (Dr. Mingo Amber) currently at bedside.  Family present at bedside.  Will continue to monitor.

## 2011-03-15 NOTE — ED Provider Notes (Signed)
History     CSN: [id number]  Arrival date & time 03/15/11  2130   First MD Initiated Contact with Patient 03/15/11 2222      Chief Complaint  Patient presents with  . Headache    head pain & head popping R side    (Consider location/radiation/quality/duration/timing/severity/associated sxs/prior treatment) Patient is a 60 y.o. female presenting with headaches.  Headache  This is a new problem. The current episode started more than 1 week ago. Episode frequency: Intermittently at random. The problem has been gradually worsening (Becoming more constant past 2 days). The headache is associated with nothing. The pain is located in the right unilateral, occipital and parietal region. The quality of the pain is described as sharp. The pain is at a severity of 7/10. The pain is moderate. The pain does not radiate. Pertinent negatives include no anorexia, no fever, no chest pressure, no palpitations, no syncope, no shortness of breath, no nausea and no vomiting. She has tried nothing for the symptoms. The treatment provided no relief.    Past Medical History  Diagnosis Date  . Depression     seasonal melancholia  . Hyperlipidemia   . Hypertension   . Arthritis     DJD, lumbar spondylosis  . Myopathy     not statin related, cause unknown   . Breast cancer   . Back pain   . Edema extremities   . Chemotherapy follow-up examination 03/06/2011  . Cough 03/06/2011    Past Surgical History  Procedure Date  . Abdominal hysterectomy     for fibroids   . Laminectomy     C5-6 Dr. Louanne Skye 2005   . Oophorectomy   . Mastectomy     bilateral     Family History  Problem Relation Age of Onset  . Cancer Mother 7    Breast cancer, mother  . Heart disease Mother 38    CAD  . Breast cancer Mother   . Cancer Father 69    colon cancer  . Colon cancer Father   . Coronary artery disease Other     History  Substance Use Topics  . Smoking status: Never Smoker   . Smokeless tobacco:  Never Used  . Alcohol Use: No    OB History    Grav Para Term Preterm Abortions TAB SAB Ect Mult Living                  Review of Systems  Constitutional: Negative for fever.  Respiratory: Negative for shortness of breath.   Cardiovascular: Negative for palpitations and syncope.  Gastrointestinal: Negative for nausea, vomiting and anorexia.  Neurological: Positive for headaches.  All other systems reviewed and are negative.    Allergies  Atorvastatin and Rosuvastatin  Home Medications   Current Outpatient Rx  Name Route Sig Dispense Refill  . ALBUTEROL SULFATE (2.5 MG/3ML) 0.083% IN NEBU Nebulization Take 2.5 mg by nebulization 2 (two) times daily as needed.      . ALBUTEROL 90 MCG/ACT IN AERS Inhalation Inhale 2 puffs into the lungs every 4 (four) hours as needed.      Marland Kitchen ESOMEPRAZOLE MAGNESIUM 40 MG PO CPDR Oral Take 40 mg by mouth daily before breakfast.      . FERROUS SULFATE 325 (65 FE) MG PO TABS Oral Take 325 mg by mouth 3 (three) times daily with meals.     Marland Kitchen FLUTICASONE-SALMETEROL 250-50 MCG/DOSE IN AEPB Inhalation Inhale 1 puff into the lungs 2 (two) times daily.      Marland Kitchen  FUROSEMIDE 40 MG PO TABS Oral Take 40 mg by mouth daily.      Marland Kitchen KLOR-CON M20 20 MEQ PO TBCR Oral Take 1 tablet by mouth daily.    Marland Kitchen LETROZOLE 2.5 MG PO TABS Oral Take 1 tablet by mouth Daily.    Marland Kitchen LORAZEPAM 1 MG PO TABS Oral Take 1 mg by mouth daily as needed. For anxiety     . ONE-DAILY MULTI VITAMINS PO TABS Oral Take 1 tablet by mouth daily.     Marland Kitchen OLMESARTAN MEDOXOMIL 40 MG PO TABS Oral Take 40 mg by mouth daily.      Marland Kitchen SPIRONOLACTONE 25 MG PO TABS Oral Take 25 mg by mouth daily.      . TRAMADOL HCL 50 MG PO TABS Oral Take 50 mg by mouth every 6 (six) hours as needed. For pain     . WARFARIN SODIUM 10 MG PO TABS Oral Take 5 mg by mouth daily.        BP 183/101  Pulse 95  Temp 98.2 F (36.8 C)  Resp 18  SpO2 97%  LMP 12/31/1968  Physical Exam  Nursing note and vitals  reviewed. Constitutional: She is oriented to person, place, and time. She appears well-developed and well-nourished. No distress.  HENT:  Head: Normocephalic and atraumatic.  Eyes: EOM are normal. Pupils are equal, round, and reactive to light.  Neck: Normal range of motion. Neck supple.  Cardiovascular: Normal rate and regular rhythm.  Exam reveals no friction rub.   No murmur heard. Pulmonary/Chest: Effort normal and breath sounds normal. No respiratory distress. She has no wheezes. She has no rales.  Abdominal: Soft. She exhibits no distension. There is no tenderness. There is no rebound.  Musculoskeletal: Normal range of motion. She exhibits no edema.  Neurological: She is alert and oriented to person, place, and time. She displays no atrophy and no tremor. No cranial nerve deficit or sensory deficit. She exhibits normal muscle tone. She displays a negative Romberg sign. She displays no seizure activity. Coordination and gait normal. GCS eye subscore is 4. GCS verbal subscore is 5. GCS motor subscore is 6.  Skin: She is not diaphoretic.    ED Course  Procedures (including critical care time)  Labs Reviewed - No data to display No results found.   1. Headache    CT Head Wo Contrast (Final result)   Result time:03/15/11 2318    Final result by Rad Results In Interface (03/15/11 23:18:22)    Narrative:   *RADIOLOGY REPORT*  Clinical Data: New onset headache  CT HEAD WITHOUT CONTRAST  Technique: Contiguous axial images were obtained from the base of the skull through the vertex without contrast.  Comparison: 12/20/2010  Findings: Minimal atrophy. Normal ventricular morphology. No midline shift or mass effect. Prominent perivascular space at inferior left basal ganglia unchanged. No intracranial hemorrhage, mass lesion or evidence of acute infarction. No extra-axial fluid collection. Disconjugate gaze again noted. Scattered mucosal thickening sphenoid sinus and  ethmoid air cells. Skull intact.  IMPRESSION: No acute intracranial abnormalities.  Original Report Authenticated By: Burnetta Sabin, M.D.      MDM  62F p/w headache. Has been having brief (<5 second) sharp head aches located in R parieto-occipital region. Happen daily, no instigating/exacerbating/alleviating factors. No radiation. Pain has stayed constant for past few days. Also hears popping noise in the same area as her headaches bilaterally when she turns her head. Denies neck pain/trauma. Denies vision changes, numbness/tingling, N/V, ataxia, fevers. No recent  illness. Currently receiving chemo for breast cancer, last treatment was last week. AFVSS, exam benign. No lesions over area of headache, normal neuro exam. No gross motor/sensory deficits. Normal coordination and gait. Will obtain Head CT to look for possible source of headache, most concerning for possible metastasis to brain. CT reviewed by me, read as negative by Radiologist.  No concerning physical exam findings to warrant further imaging or admission. Discharged home w/ instructions to f/u with regular doctor in 3-5 days for her headaches. Strict return precautions given.         Evelina Bucy, MD 03/15/11 [phone]

## 2011-03-16 ENCOUNTER — Ambulatory Visit (HOSPITAL_BASED_OUTPATIENT_CLINIC_OR_DEPARTMENT_OTHER): Payer: Medicare Other

## 2011-03-16 DIAGNOSIS — C50919 Malignant neoplasm of unspecified site of unspecified female breast: Secondary | ICD-10-CM

## 2011-03-16 DIAGNOSIS — Z452 Encounter for adjustment and management of vascular access device: Secondary | ICD-10-CM

## 2011-03-16 MED ORDER — SODIUM CHLORIDE 0.9 % IJ SOLN
10.0000 mL | INTRAMUSCULAR | Status: DC | PRN
  Administered 2011-03-16: 10 mL via INTRAVENOUS
  Filled 2011-03-16: qty 10

## 2011-03-16 MED ORDER — HEPARIN SOD (PORK) LOCK FLUSH 100 UNIT/ML IV SOLN
500.0000 [IU] | Freq: Once | INTRAVENOUS | Status: AC
  Administered 2011-03-16: 250 [IU] via INTRAVENOUS
  Filled 2011-03-16: qty 5

## 2011-03-16 NOTE — ED Notes (Signed)
Pt discharge instructions given. No medications given. Ot was instructed to follow up with primary in 3 days.

## 2011-03-16 NOTE — ED Provider Notes (Signed)
  I performed a history and physical examination of [name] and discussed her management with Dr. Mingo Amber.  I agree with the history, physical, assessment, and plan of care, with the following exceptions: None Well appearing F w active breast ca now w weeks-long HA.  On exam she is laughing, w no neuro deficits.  She was d/c home in stable condition.   Oswaldo Done, MD 03/16/11 Berniece Salines

## 2011-03-16 NOTE — ED Notes (Signed)
Patient currently sitting up in bed; no respiratory or acute distress noted.  Family present at bedside.  Patient has no questions or concerns at this time.  Will continue to monitor.

## 2011-03-16 NOTE — ED Notes (Signed)
Patient currently sitting up in bed; no respiratory or acute distress noted.  Patient updated on plan of care; informed patient that we are waiting on discharge paperwork from [facility].  Patient has no other questions or concerns at this time.  Will continue to monitor.

## 2011-03-16 NOTE — Patient Instructions (Signed)
Call MD for problems 

## 2011-03-18 ENCOUNTER — Ambulatory Visit (HOSPITAL_BASED_OUTPATIENT_CLINIC_OR_DEPARTMENT_OTHER): Payer: Medicare Other

## 2011-03-18 DIAGNOSIS — Z469 Encounter for fitting and adjustment of unspecified device: Secondary | ICD-10-CM

## 2011-03-18 DIAGNOSIS — C50919 Malignant neoplasm of unspecified site of unspecified female breast: Secondary | ICD-10-CM

## 2011-03-18 MED ORDER — SODIUM CHLORIDE 0.9 % IJ SOLN
10.0000 mL | INTRAMUSCULAR | Status: DC | PRN
  Administered 2011-03-18: 10 mL via INTRAVENOUS
  Filled 2011-03-18: qty 10

## 2011-03-18 MED ORDER — HEPARIN SOD (PORK) LOCK FLUSH 100 UNIT/ML IV SOLN
500.0000 [IU] | Freq: Once | INTRAVENOUS | Status: AC
  Administered 2011-03-18: 500 [IU] via INTRAVENOUS
  Filled 2011-03-18: qty 5

## 2011-03-20 ENCOUNTER — Other Ambulatory Visit: Payer: Self-pay | Admitting: Oncology

## 2011-03-20 ENCOUNTER — Other Ambulatory Visit (HOSPITAL_BASED_OUTPATIENT_CLINIC_OR_DEPARTMENT_OTHER): Payer: Medicare Other | Admitting: Lab

## 2011-03-20 ENCOUNTER — Ambulatory Visit (HOSPITAL_BASED_OUTPATIENT_CLINIC_OR_DEPARTMENT_OTHER): Payer: Medicare Other

## 2011-03-20 DIAGNOSIS — I82C19 Acute embolism and thrombosis of unspecified internal jugular vein: Secondary | ICD-10-CM

## 2011-03-20 DIAGNOSIS — C50919 Malignant neoplasm of unspecified site of unspecified female breast: Secondary | ICD-10-CM

## 2011-03-20 DIAGNOSIS — Z452 Encounter for adjustment and management of vascular access device: Secondary | ICD-10-CM

## 2011-03-20 LAB — PROTIME-INR
INR: 2.9 (ref 2.00–3.50)
Protime: 34.8 Seconds — ABNORMAL HIGH (ref 10.6–13.4)

## 2011-03-20 MED ORDER — HEPARIN SOD (PORK) LOCK FLUSH 100 UNIT/ML IV SOLN
500.0000 [IU] | Freq: Once | INTRAVENOUS | Status: AC
  Administered 2011-03-20: 250 [IU] via INTRAVENOUS
  Filled 2011-03-20: qty 5

## 2011-03-20 MED ORDER — SODIUM CHLORIDE 0.9 % IJ SOLN
10.0000 mL | INTRAMUSCULAR | Status: DC | PRN
  Administered 2011-03-20: 10 mL via INTRAVENOUS
  Filled 2011-03-20: qty 10

## 2011-03-23 ENCOUNTER — Ambulatory Visit (HOSPITAL_BASED_OUTPATIENT_CLINIC_OR_DEPARTMENT_OTHER): Payer: Medicare Other

## 2011-03-23 DIAGNOSIS — Z469 Encounter for fitting and adjustment of unspecified device: Secondary | ICD-10-CM

## 2011-03-23 DIAGNOSIS — C50919 Malignant neoplasm of unspecified site of unspecified female breast: Secondary | ICD-10-CM

## 2011-03-23 MED ORDER — SODIUM CHLORIDE 0.9 % IJ SOLN
10.0000 mL | INTRAMUSCULAR | Status: DC | PRN
  Administered 2011-03-23: 10 mL via INTRAVENOUS
  Filled 2011-03-23: qty 10

## 2011-03-23 MED ORDER — HEPARIN SOD (PORK) LOCK FLUSH 100 UNIT/ML IV SOLN
500.0000 [IU] | Freq: Once | INTRAVENOUS | Status: AC
  Administered 2011-03-23: 500 [IU] via INTRAVENOUS
  Filled 2011-03-23: qty 5

## 2011-03-25 ENCOUNTER — Telehealth: Payer: Self-pay | Admitting: *Deleted

## 2011-03-25 ENCOUNTER — Ambulatory Visit: Payer: Medicare Other

## 2011-03-25 ENCOUNTER — Ambulatory Visit (HOSPITAL_BASED_OUTPATIENT_CLINIC_OR_DEPARTMENT_OTHER): Payer: Medicare Other

## 2011-03-25 DIAGNOSIS — Z09 Encounter for follow-up examination after completed treatment for conditions other than malignant neoplasm: Secondary | ICD-10-CM

## 2011-03-25 DIAGNOSIS — C50919 Malignant neoplasm of unspecified site of unspecified female breast: Secondary | ICD-10-CM

## 2011-03-25 DIAGNOSIS — I82409 Acute embolism and thrombosis of unspecified deep veins of unspecified lower extremity: Secondary | ICD-10-CM

## 2011-03-25 LAB — CBC WITH DIFFERENTIAL/PLATELET
BASO%: 1.2 % (ref 0.0–2.0)
Basophils Absolute: 0.1 10*3/uL (ref 0.0–0.1)
EOS%: 6.5 % (ref 0.0–7.0)
Eosinophils Absolute: 0.3 10*3/uL (ref 0.0–0.5)
HCT: 32.2 % — ABNORMAL LOW (ref 34.8–46.6)
HGB: 10.6 g/dL — ABNORMAL LOW (ref 11.6–15.9)
LYMPH%: 44.4 % (ref 14.0–49.7)
MCH: 27.4 pg (ref 25.1–34.0)
MCHC: 32.9 g/dL (ref 31.5–36.0)
MCV: 83.2 fL (ref 79.5–101.0)
MONO#: 0.3 10*3/uL (ref 0.1–0.9)
MONO%: 5.7 % (ref 0.0–14.0)
NEUT#: 2.1 10*3/uL (ref 1.5–6.5)
NEUT%: 42.2 % (ref 38.4–76.8)
Platelets: 203 10*3/uL (ref 145–400)
RBC: 3.87 10*6/uL (ref 3.70–5.45)
RDW: 14.6 % — ABNORMAL HIGH (ref 11.2–14.5)
WBC: 5.1 10*3/uL (ref 3.9–10.3)
lymph#: 2.3 10*3/uL (ref 0.9–3.3)
nRBC: 0 % (ref 0–0)

## 2011-03-25 LAB — PROTIME-INR
INR: 2.3 (ref 2.00–3.50)
Protime: 27.6 Seconds — ABNORMAL HIGH (ref 10.6–13.4)

## 2011-03-25 LAB — BASIC METABOLIC PANEL
BUN: 23 mg/dL (ref 6–23)
CO2: 25 mEq/L (ref 19–32)
Calcium: 9.4 mg/dL (ref 8.4–10.5)
Chloride: 107 mEq/L (ref 96–112)
Creatinine, Ser: 1.53 mg/dL — ABNORMAL HIGH (ref 0.50–1.10)
Glucose, Bld: 119 mg/dL — ABNORMAL HIGH (ref 70–99)
Potassium: 4.3 mEq/L (ref 3.5–5.3)
Sodium: 143 mEq/L (ref 135–145)

## 2011-03-25 MED ORDER — HEPARIN SOD (PORK) LOCK FLUSH 100 UNIT/ML IV SOLN
500.0000 [IU] | Freq: Once | INTRAVENOUS | Status: AC
  Administered 2011-03-25: 250 [IU] via INTRAVENOUS
  Filled 2011-03-25: qty 5

## 2011-03-25 MED ORDER — SODIUM CHLORIDE 0.9 % IJ SOLN
10.0000 mL | INTRAMUSCULAR | Status: DC | PRN
  Administered 2011-03-25 (×2): 10 mL via INTRAVENOUS
  Filled 2011-03-25: qty 10

## 2011-03-25 NOTE — Telephone Encounter (Signed)
Called notified pt to continue Coumadin 5mg  daily

## 2011-03-26 ENCOUNTER — Other Ambulatory Visit: Payer: Self-pay | Admitting: *Deleted

## 2011-03-26 ENCOUNTER — Ambulatory Visit (INDEPENDENT_AMBULATORY_CARE_PROVIDER_SITE_OTHER): Payer: Medicare Other | Admitting: Internal Medicine

## 2011-03-26 ENCOUNTER — Encounter: Payer: Self-pay | Admitting: Internal Medicine

## 2011-03-26 DIAGNOSIS — C50919 Malignant neoplasm of unspecified site of unspecified female breast: Secondary | ICD-10-CM

## 2011-03-26 DIAGNOSIS — Z0000 Encounter for general adult medical examination without abnormal findings: Secondary | ICD-10-CM

## 2011-03-26 DIAGNOSIS — R6889 Other general symptoms and signs: Secondary | ICD-10-CM

## 2011-03-26 DIAGNOSIS — I82409 Acute embolism and thrombosis of unspecified deep veins of unspecified lower extremity: Secondary | ICD-10-CM

## 2011-03-26 NOTE — Patient Instructions (Signed)
Please [place] if you develop episodes of sharp pain over the back of your head again.  Otherwise, follow-up in clinic in 3 months.  Please let your oncologist know about your symptoms.

## 2011-03-26 NOTE — Assessment & Plan Note (Signed)
Deferring immunizations to oncology given her chemotherapy regimen

## 2011-03-26 NOTE — Assessment & Plan Note (Addendum)
Occipital head/neck popping/squishyness.  Went to ED on 03/15/11 with R sided popping and associated 5 second episodes of sharp 7/10 pain.  Head CT was negative.  Pain has since resolved, but popping remains, now more so on other (L) side.  Completely non-tender on exam.  Non-tender over R jugular as well.  Seems to be improving per patient.  Per patient oncology is aware of the problem.  There does not seem to be any intervention indicated at this time.  Patient instructed to call us if the sharp pains come back.  Otherwise follow-up with PCP Dr Kelton Pillar in 3 months.  Keep next chemo/oncology appt.

## 2011-03-26 NOTE — Progress Notes (Signed)
Subjective:   Patient ID: [name] female   DOB: 22-Nov-1950 61 y.o.   MRN: [id number]  HPI: [name] is a 61 y.o. undergoing chemotherapy for breast cancer s/p bilateral mastectomy, arthritis, and back pain presents with neck popping for 2 weeks.  Presented to ED on 03/15/11 for R neck popping and associated episodes of sharp 7/10 pain.  Pain has since resolved but popping/squishyness remains.  Now moreso on L side.    Had acute R IJ vein DVT in September 2012 due to port-a-cath.  Port-a-cath was removed and PICC in place on R side.  PICC position was good on CXRay 12/21/10.  Per family there have been no problems since, has not been pulled out/put back in.    Past Medical History  Diagnosis Date  . Depression     seasonal melancholia  . Hyperlipidemia   . Hypertension   . Arthritis     DJD, lumbar spondylosis  . Myopathy     not statin related, cause unknown   . Breast cancer   . Back pain   . Edema extremities   . Chemotherapy follow-up examination 03/06/2011  . Cough 03/06/2011   Current Outpatient Prescriptions  Medication Sig Dispense Refill  . albuterol (PROVENTIL) (2.5 MG/3ML) 0.083% nebulizer solution Take 2.5 mg by nebulization 2 (two) times daily as needed.        Marland Kitchen albuterol (PROVENTIL,VENTOLIN) 90 MCG/ACT inhaler Inhale 2 puffs into the lungs every 4 (four) hours as needed.        Marland Kitchen esomeprazole (NEXIUM) 40 MG capsule Take 40 mg by mouth daily before breakfast.        . ferrous sulfate 325 (65 FE) MG tablet Take 325 mg by mouth 3 (three) times daily with meals.       . Fluticasone-Salmeterol (ADVAIR) 250-50 MCG/DOSE AEPB Inhale 1 puff into the lungs 2 (two) times daily.        . furosemide (LASIX) 40 MG tablet Take 40 mg by mouth daily.        Marland Kitchen KLOR-CON M20 20 MEQ tablet Take 1 tablet by mouth daily.      Marland Kitchen letrozole (FEMARA) 2.5 MG tablet Take 1 tablet by mouth Daily.      Marland Kitchen LORazepam (ATIVAN) 1 MG tablet Take 1 mg by mouth daily as needed. For  anxiety       . Multiple Vitamin (MULTIVITAMIN) tablet Take 1 tablet by mouth daily.       Marland Kitchen olmesartan (BENICAR) 40 MG tablet Take 40 mg by mouth daily.        Marland Kitchen spironolactone (ALDACTONE) 25 MG tablet Take 25 mg by mouth daily.        . traMADol (ULTRAM) 50 MG tablet Take 50 mg by mouth every 6 (six) hours as needed. For pain       . warfarin (COUMADIN) 10 MG tablet Take 5 mg by mouth daily.         No current facility-administered medications for this visit.   Facility-Administered Medications Ordered in Other Visits  Medication Dose Route Frequency Provider Last Rate Last Dose  . sodium chloride 0.9 % injection 10 mL  10 mL Intravenous PRN Deatra Robinson, MD   10 mL at 01/26/11 1035   Family History  Problem Relation Age of Onset  . Cancer Mother 28    Breast cancer, mother  . Heart disease Mother 45    CAD  . Breast cancer Mother   . Cancer Father  76    colon cancer  . Colon cancer Father   . Coronary artery disease Other    History   Social History  . Marital Status: Single    Spouse Name: N/A    Number of Children: N/A  . Years of Education: N/A   Social History Main Topics  . Smoking status: Never Smoker   . Smokeless tobacco: Never Used  . Alcohol Use: No  . Drug Use: None  . Sexually Active: None   Other Topics Concern  . None   Social History Narrative   Single, 2 kidsNever smokedNo alcohol useNo drugsLaid off from [employer] after working there for 14 years.    Review of Systems: Constitutional: Denies fever, chills, diaphoresis Respiratory: Denies SOB, DOE, cough, chest tightness,  and wheezing.   Cardiovascular: Denies chest pain, palpitations Gastrointestinal: Denies nausea, vomiting, abdominal pain, diarrhea, constipation Genitourinary: Denies dysuria, urgency, frequency, hematuria  Objective:  Physical Exam: Filed Vitals:   03/26/11 1424  BP: 150/93  Pulse: 88  Temp: 97.4 F (36.3 C)  TempSrc: Oral  Height: 5\' 8"  (1.727 m)    Weight: 237 lb 12.8 oz (107.865 kg)  SpO2: 97%   Constitutional: Vital signs reviewed.  Patient is a well-developed and well-nourished female in no acute distress and cooperative with exam. Alert and oriented x3.  Head: Normocephalic and atraumatic.  No tenderness in occipital region or over either jugular vein.  No masses palpable.   Mouth: no erythema or exudates, MMM Eyes: PERRL, EOMI, conjunctivae normal, No scleral icterus.  Neck: No JVD Cardiovascular: RRR, S1 normal, S2 normal, no MRG, pulses symmetric and intact bilaterally Pulmonary/Chest: CTAB, no wheezes, rales, or rhonchi Neurological: A&O x3, Strength is normal and symmetric bilaterally, cranial nerve II-XII are grossly intact    Assessment & Plan:

## 2011-03-27 ENCOUNTER — Ambulatory Visit: Payer: Medicare Other | Admitting: Physician Assistant

## 2011-03-27 ENCOUNTER — Ambulatory Visit (HOSPITAL_BASED_OUTPATIENT_CLINIC_OR_DEPARTMENT_OTHER): Payer: Medicare Other

## 2011-03-27 ENCOUNTER — Telehealth: Payer: Self-pay | Admitting: Oncology

## 2011-03-27 ENCOUNTER — Ambulatory Visit (HOSPITAL_BASED_OUTPATIENT_CLINIC_OR_DEPARTMENT_OTHER): Payer: Medicare Other | Admitting: Family

## 2011-03-27 ENCOUNTER — Other Ambulatory Visit: Payer: Medicare Other | Admitting: Lab

## 2011-03-27 ENCOUNTER — Encounter: Payer: Self-pay | Admitting: Family

## 2011-03-27 VITALS — BP 190/98 | HR 87 | Temp 98.3°F | Ht 68.0 in | Wt 234.4 lb

## 2011-03-27 VITALS — BP 149/90 | HR 80

## 2011-03-27 DIAGNOSIS — I82409 Acute embolism and thrombosis of unspecified deep veins of unspecified lower extremity: Secondary | ICD-10-CM

## 2011-03-27 DIAGNOSIS — C50919 Malignant neoplasm of unspecified site of unspecified female breast: Secondary | ICD-10-CM

## 2011-03-27 DIAGNOSIS — Z5112 Encounter for antineoplastic immunotherapy: Secondary | ICD-10-CM

## 2011-03-27 LAB — CBC WITH DIFFERENTIAL/PLATELET
BASO%: 0.8 % (ref 0.0–2.0)
Basophils Absolute: 0.1 10*3/uL (ref 0.0–0.1)
EOS%: 6.8 % (ref 0.0–7.0)
Eosinophils Absolute: 0.4 10*3/uL (ref 0.0–0.5)
HCT: 33.6 % — ABNORMAL LOW (ref 34.8–46.6)
HGB: 11.3 g/dL — ABNORMAL LOW (ref 11.6–15.9)
LYMPH%: 40.1 % (ref 14.0–49.7)
MCH: 27.6 pg (ref 25.1–34.0)
MCHC: 33.6 g/dL (ref 31.5–36.0)
MCV: 82.2 fL (ref 79.5–101.0)
MONO#: 0.3 10*3/uL (ref 0.1–0.9)
MONO%: 4.6 % (ref 0.0–14.0)
NEUT#: 2.9 10*3/uL (ref 1.5–6.5)
NEUT%: 47.7 % (ref 38.4–76.8)
Platelets: 204 10*3/uL (ref 145–400)
RBC: 4.09 10*6/uL (ref 3.70–5.45)
RDW: 14.4 % (ref 11.2–14.5)
WBC: 6 10*3/uL (ref 3.9–10.3)
lymph#: 2.4 10*3/uL (ref 0.9–3.3)

## 2011-03-27 LAB — PROTIME-INR
INR: 2.4 (ref 2.00–3.50)
Protime: 28.8 Seconds — ABNORMAL HIGH (ref 10.6–13.4)

## 2011-03-27 MED ORDER — DIPHENHYDRAMINE HCL 25 MG PO CAPS
50.0000 mg | ORAL_CAPSULE | Freq: Once | ORAL | Status: AC
  Administered 2011-03-27: 50 mg via ORAL

## 2011-03-27 MED ORDER — TRASTUZUMAB CHEMO INJECTION 440 MG
6.0000 mg/kg | Freq: Once | INTRAVENOUS | Status: AC
  Administered 2011-03-27: 630 mg via INTRAVENOUS
  Filled 2011-03-27: qty 30

## 2011-03-27 MED ORDER — ACETAMINOPHEN 325 MG PO TABS
650.0000 mg | ORAL_TABLET | Freq: Once | ORAL | Status: AC
  Administered 2011-03-27: 650 mg via ORAL

## 2011-03-27 MED ORDER — SODIUM CHLORIDE 0.9 % IV SOLN
Freq: Once | INTRAVENOUS | Status: AC
  Administered 2011-03-27: 11:00:00 via INTRAVENOUS

## 2011-03-27 NOTE — Assessment & Plan Note (Signed)
Currently getting ongoing chemotherapy by oncology.  Says she has minimal side effects.

## 2011-03-27 NOTE — Progress Notes (Signed)
[facility]  Name: [name]                  DATE: 03/27/2011 MRN: [id number]                      DOB: 01/07/1951  CC:          Lester [place], MD  REFERRING PHYSICIAN: Lester [place], MD  DIAGNOSIS:    . Breast cancer, IDC, left breast, Stage IIA. ER/PR +, HER2 overexpressed, diagnosed October 2011.  History DVT at port site with subsequent removal.      CURRENT THERAPY: Completed Taxol/Carboplatin, now on Herceptin every 3 weeks and Letrozole.  Coumadin 5 mg/daily.   INTERIM HISTORY:Is tolerating Herceptin therapy well with no appreciable side effects or complaints. To complete Herceptin in February 2013. Will remain on letrozole for a total of 5 years. She reports no arthralgias or myalgias associated with letrozole treatment. No nausea or vomiting, no change in bowel or bladder habits.  Development of edema and pain over the dorsum of the right hand, indwelling PICC line in the right arm. No redness, drainage, or signs and symptoms of infection surrounding the PICC site.  Saw cardiology recently who monitors her peripheral edema and cardiac function.  PHYSICAL EXAM: BP 190/98  Pulse 87  Temp 98.3 F (36.8 C)  Ht 5\' 8"  (1.727 m)  Wt 234 lb 6.4 oz (106.323 kg)  BMI 35.64 kg/m2  LMP 12/31/1968 General: Well developed, well nourished, in no acute distress.  EENT: No ocular or oral lesions. No stomatitis.  Respiratory: Lungs are clear to auscultation bilaterally with normal respiratory movement and no accessory muscle use. Cardiac: No murmur, rub or tachycardia. Edema, right upper extremity. 2+ pitting, dorsum right hand. Bilateral lower extremity edema, 3+ pitting.   GI: Abdomen is soft, no palpable hepatosplenomegaly. No fluid wave. No tenderness. Musculoskeletal: No kyphosis, no tenderness over the spine, ribs or hips. Lymph: No cervical, infraclavicular, axillary or inguinal adenopathy. Neuro: No focal neurological deficits. Psych: Alert and oriented X  3, appropriate mood and affect.  Breast exam: Bilateral mastectomy with well-healed remote incisions. No redness of the skin or nodules. No axillary adenopathy bilaterally.   LABORATORY STUDIES:   Results for orders placed in visit on 03/27/11  CBC WITH DIFFERENTIAL      Component Value Range   WBC 6.0  3.9 - 10.3 (10e3/uL)   NEUT# 2.9  1.5 - 6.5 (10e3/uL)   HGB 11.3 (*) 11.6 - 15.9 (g/dL)   HCT 33.6 (*) 34.8 - 46.6 (%)   Platelets 204  145 - 400 (10e3/uL)   MCV 82.2  79.5 - 101.0 (fL)   MCH 27.6  25.1 - 34.0 (pg)   MCHC 33.6  31.5 - 36.0 (g/dL)   RBC 4.09  3.70 - 5.45 (10e6/uL)   RDW 14.4  11.2 - 14.5 (%)   lymph# 2.4  0.9 - 3.3 (10e3/uL)   MONO# 0.3  0.1 - 0.9 (10e3/uL)   Eosinophils Absolute 0.4  0.0 - 0.5 (10e3/uL)   Basophils Absolute 0.1  0.0 - 0.1 (10e3/uL)   NEUT% 47.7  38.4 - 76.8 (%)   LYMPH% 40.1  14.0 - 49.7 (%)   MONO% 4.6  0.0 - 14.0 (%)   EOS% 6.8  0.0 - 7.0 (%)   BASO% 0.8  0.0 - 2.0 (%)  PROTIME-INR      Component Value Range   Protime 28.8 (*) 10.6 - 13.4 (Seconds)  INR 2.40  2.00 - 3.50    Lovenox No      IMPRESSION:  61 year old female with:  1. History of left breast infiltrating ductal carcinoma, diagnosed October 2010, Stage IIA. Post bilateral mastectomy with no evidence of recurrence. 2. History of DVT at port site, now with indwelling PICC line. New onset edema and pain, dorsum right hand. 3. INR therapeutic, 2.40, on Coumadin 5 mg/daily.   PLAN:   1. Remove PICC line today. Will receive last 2 Herceptin treatments with peripheral IV. 2. Return to clinic in 3 weeks for final Herceptin treatment. She will see Dr. Humphrey Rolls on that day.

## 2011-03-27 NOTE — Telephone Encounter (Signed)
Gv pt appt for jan2013

## 2011-03-27 NOTE — Progress Notes (Signed)
I agree with Dr. Rondell Reams assessment and plan.

## 2011-03-30 ENCOUNTER — Encounter: Payer: Self-pay | Admitting: *Deleted

## 2011-03-30 ENCOUNTER — Telehealth: Payer: Self-pay | Admitting: Oncology

## 2011-03-30 ENCOUNTER — Encounter: Payer: Self-pay | Admitting: Oncology

## 2011-03-30 NOTE — Telephone Encounter (Signed)
Gv pt appt for jan-feb2013

## 2011-03-30 NOTE — Progress Notes (Signed)
Approved for 100% 03/28/11 - 03/27/12. Family 1 total yearly income $12,705.

## 2011-04-03 ENCOUNTER — Other Ambulatory Visit: Payer: Self-pay

## 2011-04-03 ENCOUNTER — Telehealth: Payer: Self-pay | Admitting: Oncology

## 2011-04-03 ENCOUNTER — Other Ambulatory Visit: Payer: Self-pay | Admitting: Family

## 2011-04-03 ENCOUNTER — Other Ambulatory Visit (HOSPITAL_BASED_OUTPATIENT_CLINIC_OR_DEPARTMENT_OTHER): Payer: Medicare Other | Admitting: Lab

## 2011-04-03 DIAGNOSIS — Z170 Estrogen receptor positive status [ER+]: Secondary | ICD-10-CM

## 2011-04-03 DIAGNOSIS — C50919 Malignant neoplasm of unspecified site of unspecified female breast: Secondary | ICD-10-CM

## 2011-04-03 DIAGNOSIS — Z5111 Encounter for antineoplastic chemotherapy: Secondary | ICD-10-CM

## 2011-04-03 DIAGNOSIS — I82409 Acute embolism and thrombosis of unspecified deep veins of unspecified lower extremity: Secondary | ICD-10-CM

## 2011-04-03 DIAGNOSIS — Z5112 Encounter for antineoplastic immunotherapy: Secondary | ICD-10-CM

## 2011-04-03 LAB — CBC WITH DIFFERENTIAL/PLATELET
BASO%: 0.6 % (ref 0.0–2.0)
Basophils Absolute: 0 10*3/uL (ref 0.0–0.1)
EOS%: 5.9 % (ref 0.0–7.0)
Eosinophils Absolute: 0.3 10*3/uL (ref 0.0–0.5)
HCT: 32.6 % — ABNORMAL LOW (ref 34.8–46.6)
HGB: 11 g/dL — ABNORMAL LOW (ref 11.6–15.9)
LYMPH%: 41.9 % (ref 14.0–49.7)
MCH: 28.2 pg (ref 25.1–34.0)
MCHC: 33.7 g/dL (ref 31.5–36.0)
MCV: 83.8 fL (ref 79.5–101.0)
MONO#: 0.3 10*3/uL (ref 0.1–0.9)
MONO%: 5.5 % (ref 0.0–14.0)
NEUT#: 2.3 10*3/uL (ref 1.5–6.5)
NEUT%: 46.1 % (ref 38.4–76.8)
Platelets: 236 10*3/uL (ref 145–400)
RBC: 3.89 10*6/uL (ref 3.70–5.45)
RDW: 15.2 % — ABNORMAL HIGH (ref 11.2–14.5)
WBC: 5 10*3/uL (ref 3.9–10.3)
lymph#: 2.1 10*3/uL (ref 0.9–3.3)

## 2011-04-03 LAB — COMPREHENSIVE METABOLIC PANEL
ALT: 28 U/L (ref 0–35)
AST: 24 U/L (ref 0–37)
Albumin: 4.3 g/dL (ref 3.5–5.2)
Alkaline Phosphatase: 111 U/L (ref 39–117)
BUN: 21 mg/dL (ref 6–23)
CO2: 27 mEq/L (ref 19–32)
Calcium: 9.5 mg/dL (ref 8.4–10.5)
Chloride: 107 mEq/L (ref 96–112)
Creatinine, Ser: 1.43 mg/dL — ABNORMAL HIGH (ref 0.50–1.10)
Glucose, Bld: 119 mg/dL — ABNORMAL HIGH (ref 70–99)
Potassium: 4.4 mEq/L (ref 3.5–5.3)
Sodium: 142 mEq/L (ref 135–145)
Total Bilirubin: 0.4 mg/dL (ref 0.3–1.2)
Total Protein: 7 g/dL (ref 6.0–8.3)

## 2011-04-03 LAB — PROTIME-INR
INR: 2.2 (ref 2.00–3.50)
Protime: 26.4 Seconds — ABNORMAL HIGH (ref 10.6–13.4)

## 2011-04-03 NOTE — Telephone Encounter (Signed)
called pt and informed her that we cancelled her lab appt on 01/25 and to come in @ 10:30am

## 2011-04-10 ENCOUNTER — Other Ambulatory Visit (HOSPITAL_BASED_OUTPATIENT_CLINIC_OR_DEPARTMENT_OTHER): Payer: Medicare Other | Admitting: Lab

## 2011-04-10 DIAGNOSIS — Z7901 Long term (current) use of anticoagulants: Secondary | ICD-10-CM

## 2011-04-10 LAB — PROTIME-INR
INR: 2.3 (ref 2.00–3.50)
Protime: 27.6 Seconds — ABNORMAL HIGH (ref 10.6–13.4)

## 2011-04-14 ENCOUNTER — Telehealth: Payer: Self-pay | Admitting: *Deleted

## 2011-04-14 NOTE — Telephone Encounter (Signed)
Labs reviewed by MD, notified pt to continue Coumadin 5 mg daily. Recheck labs as scheduled. Pt verbalized understanding.

## 2011-04-17 ENCOUNTER — Ambulatory Visit (HOSPITAL_BASED_OUTPATIENT_CLINIC_OR_DEPARTMENT_OTHER): Payer: Medicare Other

## 2011-04-17 ENCOUNTER — Ambulatory Visit (HOSPITAL_BASED_OUTPATIENT_CLINIC_OR_DEPARTMENT_OTHER): Payer: Medicare Other | Admitting: Oncology

## 2011-04-17 ENCOUNTER — Other Ambulatory Visit: Payer: Medicare Other | Admitting: Lab

## 2011-04-17 DIAGNOSIS — Z86718 Personal history of other venous thrombosis and embolism: Secondary | ICD-10-CM

## 2011-04-17 DIAGNOSIS — I82409 Acute embolism and thrombosis of unspecified deep veins of unspecified lower extremity: Secondary | ICD-10-CM

## 2011-04-17 DIAGNOSIS — C50919 Malignant neoplasm of unspecified site of unspecified female breast: Secondary | ICD-10-CM

## 2011-04-17 DIAGNOSIS — Z170 Estrogen receptor positive status [ER+]: Secondary | ICD-10-CM

## 2011-04-17 DIAGNOSIS — Z8673 Personal history of transient ischemic attack (TIA), and cerebral infarction without residual deficits: Secondary | ICD-10-CM

## 2011-04-17 DIAGNOSIS — Z5112 Encounter for antineoplastic immunotherapy: Secondary | ICD-10-CM

## 2011-04-17 MED ORDER — DIPHENHYDRAMINE HCL 25 MG PO CAPS: 50.0000 mg | ORAL_CAPSULE | Freq: Once | ORAL | Status: DC

## 2011-04-17 MED ORDER — SODIUM CHLORIDE 0.9 % IV SOLN: Freq: Once | INTRAVENOUS | Status: DC

## 2011-04-17 MED ORDER — TRASTUZUMAB CHEMO INJECTION 440 MG
6.0000 mg/kg | Freq: Once | INTRAVENOUS | Status: AC
  Administered 2011-04-17: 630 mg via INTRAVENOUS
  Filled 2011-04-17: qty 30

## 2011-04-17 MED ORDER — ACETAMINOPHEN 325 MG PO TABS: 650.0000 mg | ORAL_TABLET | Freq: Once | ORAL | Status: DC

## 2011-04-19 NOTE — Progress Notes (Signed)
OFFICE PROGRESS NOTE  CC [name], MD [name], MD  [name], MD, MD [place] [address]  DIAGNOSIS: 61 year old female with  #1 stage II A. ER/PR positive HER-2/neu positive invasive ductal carcinoma of the left breast diagnosed October 2011. Patient is status post bilateral mastectomies.  #2 status post right CVA.  #3 right internal jugular deep venous thrombosis secondary to her Port-A-Cath  #4 status post PICC line placement for ongoing Herceptin therapy.  PRIOR THERAPY:  #1 Status post bilateral mastectomy performed in October 2011  #2 The left breast: final pathology revealed infiltrating ductal carcinoma that was ER positive PR positive HER-2/neu positive. Right breast specimen only revealed benign breast tissue.  #3 patient received adjuvant chemotherapy consisting of weekly Taxotere carboplatinum and Herceptin for a total of 6 cycle.  #4 She was then begun on Herceptin every 3 weeks with letrozole 2.5 mg daily in May June 2012.  CURRENT THERAPY: Patient is here for her every 3 week Herceptin and continued dosing of letrozole 2.5 mg.  INTERVAL HISTORY: [name] 61 y.o. female returns for a low visit today. Overall she seems to be doing well she is without any complaints. She is tolerating the Herceptin well. She is having ongoing monitoring of her heart function by Dr. Fabian November is tolerated the Herceptin again very were very well. She is on Coumadin she has no evidence of bleeding on Coumadin.  Her INRs have been very therapeutic on her present dose. I am very pleased with this. He denies any fevers chills night sweats headaches shortness of breath chest pains palpitations no myalgias or arthralgias. Remainder of the 10 point review of systems is negative.  MEDICAL HISTORY: Past Medical History  Diagnosis Date  . Depression     seasonal melancholia  . Hyperlipidemia   . Hypertension   . Arthritis      DJD, lumbar spondylosis  . Myopathy     not statin related, cause unknown   . Breast cancer   . Back pain   . Edema extremities   . Chemotherapy follow-up examination 03/06/2011  . Cough 03/06/2011    ALLERGIES:  is allergic to atorvastatin and rosuvastatin.  MEDICATIONS:  Current Outpatient Prescriptions  Medication Sig Dispense Refill  . albuterol (PROVENTIL) (2.5 MG/3ML) 0.083% nebulizer solution Take 2.5 mg by nebulization 2 (two) times daily as needed.        Marland Kitchen albuterol (PROVENTIL,VENTOLIN) 90 MCG/ACT inhaler Inhale 2 puffs into the lungs every 4 (four) hours as needed.        Marland Kitchen esomeprazole (NEXIUM) 40 MG capsule Take 40 mg by mouth daily before breakfast.        . ferrous sulfate 325 (65 FE) MG tablet Take 325 mg by mouth 3 (three) times daily with meals.       . Fluticasone-Salmeterol (ADVAIR) 250-50 MCG/DOSE AEPB Inhale 1 puff into the lungs 2 (two) times daily.        . furosemide (LASIX) 40 MG tablet Take 40 mg by mouth daily.        Marland Kitchen KLOR-CON M20 20 MEQ tablet Take 1 tablet by mouth daily.      Marland Kitchen letrozole (FEMARA) 2.5 MG tablet Take 1 tablet by mouth Daily.      Marland Kitchen LORazepam (ATIVAN) 1 MG tablet Take 1 mg by mouth daily as needed. For anxiety       . Multiple Vitamin (MULTIVITAMIN) tablet Take 1 tablet by mouth daily.       Marland Kitchen  olmesartan (BENICAR) 40 MG tablet Take 40 mg by mouth daily.        Marland Kitchen spironolactone (ALDACTONE) 25 MG tablet Take 25 mg by mouth daily.        . traMADol (ULTRAM) 50 MG tablet Take 50 mg by mouth every 6 (six) hours as needed. For pain       . warfarin (COUMADIN) 10 MG tablet Take 5 mg by mouth daily.         No current facility-administered medications for this visit.   Facility-Administered Medications Ordered in Other Visits  Medication Dose Route Frequency Provider Last Rate Last Dose  . 0.9 %  sodium chloride infusion   Intravenous Once Deatra Robinson, MD      . acetaminophen (TYLENOL) tablet 650 mg  650 mg Oral Once Deatra Robinson,  MD      . diphenhydrAMINE (BENADRYL) capsule 50 mg  50 mg Oral Once Deatra Robinson, MD      . sodium chloride 0.9 % injection 10 mL  10 mL Intravenous PRN Deatra Robinson, MD   10 mL at 01/26/11 1035    SURGICAL HISTORY:  Past Surgical History  Procedure Date  . Abdominal hysterectomy     for fibroids   . Laminectomy     C5-6 Dr. Louanne Skye 2005   . Oophorectomy   . Mastectomy     bilateral     REVIEW OF SYSTEMS:  Pertinent items are noted in HPI.   PHYSICAL EXAMINATION: General appearance: alert, cooperative and appears stated age Neck: no adenopathy, no carotid bruit, no JVD, supple, symmetrical, trachea midline and thyroid not enlarged, symmetric, no tenderness/mass/nodules Lymph nodes: Cervical, supraclavicular, and axillary nodes normal. Resp: clear to auscultation bilaterally and normal percussion bilaterally Back: symmetric, no curvature. ROM normal. No CVA tenderness. Cardio: regular rate and rhythm, S1, S2 normal, no murmur, click, rub or gallop and normal apical impulse GI: soft, non-tender; bowel sounds normal; no masses,  no organomegaly Extremities: extremities normal, atraumatic, no cyanosis or edema Neurologic: Alert and oriented X 3, normal strength and tone. Normal symmetric reflexes. Normal coordination and gait Mental status: Alert, oriented, thought content appropriate Sensory: normal Motor: grossly normal Reflexes: 2+ and symmetric   ECOG PERFORMANCE STATUS: 0 - Asymptomatic  Blood pressure 166/94, pulse 80, temperature 98.3 F (36.8 C), temperature source Oral, height 5\' 8"  (1.727 m), weight 240 lb 4.8 oz (108.999 kg), last menstrual period 12/31/1968.  LABORATORY DATA: Lab Results  Component Value Date   WBC 5.0 04/03/2011   HGB 11.0* 04/03/2011   HCT 32.6* 04/03/2011   MCV 83.8 04/03/2011   PLT 236 04/03/2011      Chemistry      Component Value Date/Time   NA 142 04/03/2011 0957   K 4.4 04/03/2011 0957   CL 107 04/03/2011 0957   CO2 27 04/03/2011 0957    BUN 21 04/03/2011 0957   CREATININE 1.43* 04/03/2011 0957   CREATININE 1.23* 01/01/2011 1549      Component Value Date/Time   CALCIUM 9.5 04/03/2011 0957   ALKPHOS 111 04/03/2011 0957   AST 24 04/03/2011 0957   ALT 28 04/03/2011 0957   BILITOT 0.4 04/03/2011 0957       RADIOGRAPHIC STUDIES:  No results found.  ASSESSMENT: 61 year old female with:  1.  stage II a invasive ductal carcinoma of the left breast diagnosed in October 2011. She was found to have a ER/PR positive HER-2/neu positive breast cancer.  2. She underwent bilateral mastectomies.  Her right mastectomy was an elective prophylactic mastectomy due to her on comfort.  3.  Patient has also had right cerebrovascular accident.   4. she has had a deep venous thrombosis of the right internal jugular vein due to due to her Port-A-Cath. She currently has a PICC line in place but this was pulled. She is on coumadin  PLAN:   1. Proceed with her last Herceptin treatment today 04/17/11  2. She will continue letrozole for a total of 5 years.  3. Thrombosis: we will continue coumadin and check INR to keep INR between 2 -3.  4. Duration of anitcoagulation for another 3 months.  5. RTC in 3 months time or sooner if need arises  All questions were answered. The patient knows to call the clinic with any problems, questions or concerns. We can certainly see the patient much sooner if necessary.  I spent 20 minutes counseling the patient face to face. The total time spent in the appointment was 30 minutes.    Marcy Panning, MD Medical/Oncology [facility] [phone] (beeper) [phone] (Office)  04/19/2011, 5:59 PM

## 2011-04-23 ENCOUNTER — Ambulatory Visit (HOSPITAL_COMMUNITY)
Admission: RE | Admit: 2011-04-23 | Discharge: 2011-04-23 | Disposition: A | Discharge status: Home / Self Care | Payer: Medicare Other | Source: Ambulatory Visit | Attending: Internal Medicine | Admitting: Internal Medicine

## 2011-04-23 VITALS — BP 140/80 | HR 96 | Wt 239.0 lb

## 2011-04-23 DIAGNOSIS — C50919 Malignant neoplasm of unspecified site of unspecified female breast: Secondary | ICD-10-CM | POA: Insufficient documentation

## 2011-04-23 DIAGNOSIS — I10 Essential (primary) hypertension: Secondary | ICD-10-CM | POA: Insufficient documentation

## 2011-04-23 DIAGNOSIS — R609 Edema, unspecified: Secondary | ICD-10-CM | POA: Insufficient documentation

## 2011-04-23 MED ORDER — CARVEDILOL 3.125 MG PO TABS: 3.1250 mg | ORAL_TABLET | Freq: Two times a day (BID) | ORAL | Status: DC

## 2011-04-23 NOTE — Patient Instructions (Signed)
Follow up 2 months with an ECHO  Take Carvedilol 3.125 mg twice a day

## 2011-04-24 ENCOUNTER — Other Ambulatory Visit (HOSPITAL_BASED_OUTPATIENT_CLINIC_OR_DEPARTMENT_OTHER): Payer: Medicare Other | Admitting: Lab

## 2011-04-24 ENCOUNTER — Telehealth: Payer: Self-pay

## 2011-04-24 DIAGNOSIS — I82409 Acute embolism and thrombosis of unspecified deep veins of unspecified lower extremity: Secondary | ICD-10-CM

## 2011-04-24 LAB — PROTIME-INR
INR: 2.4 (ref 2.00–3.50)
Protime: 28.8 Seconds — ABNORMAL HIGH (ref 10.6–13.4)

## 2011-04-24 NOTE — Telephone Encounter (Signed)
Called pt and left message on home vm to continue current dose of coumadin, per Dr. Humphrey Rolls.  Asked pt to call office back to confirm she rec'd this message.

## 2011-04-26 NOTE — Progress Notes (Signed)
Patient ID: [name], female   DOB: [birth year], 61 y.o.   MRN: [id number]  HPI: [name] is a 61 y/o women with h/o obesity, HTN,asthma and HL. Referred by Dr. Humphrey Rolls for management of cardiac status during Herceptin therapy (started May 2012)  Found to have L ductal breast CA. ER/PR +. HER 2 neu was equivocal. s/p bilateral mastectomies in 11/11. Lymph nodes negative.    Echo April 10, 2010 EF 55-60%. Grade 1 diastolic dusfunction. MUGA 2/12 EF 73%  Echo April 9,2012 EF 60-65% lat s' 10.43 Echo 12/13/10 60-65% poor windows for lat s' peak I see is 8.09 ECHO 02/24/12 EF 55-60% lateral s' 6.5  Was admitted in April 2012, with CVA manifested by LUE dyscoordination. CVA confirmed by MRI. Now resolved.   At last visit in September we changed HCTZ to lasix for longstanding LE edema (since her daughter was born in 43).   She is here for follow up. Completed Herceptin last Friday. Continues to complain of dyspnea going up stairs. Weight at office is 239 . Good appetite. Sleeps on 2 pillows . Denies PND/Orthopnea.  Chronic lower extremity edema.    ROS: All systems negative except as listed in HPI, PMH and Problem List.  Past Medical History  Diagnosis Date  . Depression     seasonal melancholia  . Hyperlipidemia   . Hypertension   . Arthritis     DJD, lumbar spondylosis  . Myopathy     not statin related, cause unknown   . Breast cancer   . Back pain   . Edema extremities   . Chemotherapy follow-up examination 03/06/2011  . Cough 03/06/2011    Current Outpatient Prescriptions  Medication Sig Dispense Refill  . albuterol (PROVENTIL,VENTOLIN) 90 MCG/ACT inhaler Inhale 2 puffs into the lungs every 4 (four) hours as needed.        Marland Kitchen esomeprazole (NEXIUM) 40 MG capsule Take 40 mg by mouth daily before breakfast.        . ferrous sulfate 325 (65 FE) MG tablet Take 325 mg by mouth 3 (three) times daily with meals.       . Fluticasone-Salmeterol (ADVAIR) 250-50 MCG/DOSE AEPB Inhale 1  puff into the lungs 2 (two) times daily.        . furosemide (LASIX) 40 MG tablet Take 40 mg by mouth daily.        Marland Kitchen KLOR-CON M20 20 MEQ tablet Take 1 tablet by mouth daily.      Marland Kitchen letrozole (FEMARA) 2.5 MG tablet Take 1 tablet by mouth Daily.      Marland Kitchen LORazepam (ATIVAN) 1 MG tablet Take 1 mg by mouth daily as needed. For anxiety       . Multiple Vitamin (MULTIVITAMIN) tablet Take 1 tablet by mouth daily.       Marland Kitchen olmesartan (BENICAR) 40 MG tablet Take 40 mg by mouth daily.        Marland Kitchen spironolactone (ALDACTONE) 25 MG tablet Take 25 mg by mouth daily.        . traMADol (ULTRAM) 50 MG tablet Take 50 mg by mouth every 6 (six) hours as needed. For pain       . warfarin (COUMADIN) 10 MG tablet Take 5 mg by mouth daily.        Marland Kitchen albuterol (PROVENTIL) (2.5 MG/3ML) 0.083% nebulizer solution Take 2.5 mg by nebulization 2 (two) times daily as needed.        . carvedilol (COREG) 3.125 MG tablet Take 1 tablet (3.125  mg total) by mouth 2 (two) times daily with a meal.  60 tablet  6   No current facility-administered medications for this encounter.   Facility-Administered Medications Ordered in Other Encounters  Medication Dose Route Frequency Provider Last Rate Last Dose  . 0.9 %  sodium chloride infusion   Intravenous Once Deatra Robinson, MD      . acetaminophen (TYLENOL) tablet 650 mg  650 mg Oral Once Deatra Robinson, MD      . diphenhydrAMINE (BENADRYL) capsule 50 mg  50 mg Oral Once Deatra Robinson, MD      . sodium chloride 0.9 % injection 10 mL  10 mL Intravenous PRN Deatra Robinson, MD   10 mL at 01/26/11 1035     PHYSICAL EXAM: Filed Vitals:   04/23/11 1040  BP: 140/80  Pulse: 96   General:  Well appearing. No resp difficulty HEENT: normal Neck: supple. JVP flat. Carotids 2+ bilaterally; no bruits. No lymphadenopathy or thryomegaly appreciated. Cor: PMI normal. Regular rate & rhythm. No rubs, murmurs.  Lungs: clear Abdomen: obese, soft, nontender, nondistended. No hepatosplenomegaly. No  bruits or masses. Good bowel sounds. Extremities: no cyanosis, clubbing, rash. trival pretibial edema. 2+ feet edema Neuro: alert & orientedx3, cranial nerves grossly intact. Moves all 4 extremities w/o difficulty. Affect pleasant.    ASSESSMENT & PLAN:

## 2011-04-26 NOTE — Assessment & Plan Note (Signed)
Chronic dependent edema. Recommended compression hose. Keep feet elevated.

## 2011-04-26 NOTE — Assessment & Plan Note (Signed)
Reviewed echo with her. All parameters are stable. Continue Herceptin. Will complete therapy soon and will need f/u echo for post-chemo baseline.

## 2011-04-26 NOTE — Assessment & Plan Note (Signed)
BP is up. Add carvedilol 3.125 bid.

## 2011-05-01 ENCOUNTER — Ambulatory Visit (HOSPITAL_BASED_OUTPATIENT_CLINIC_OR_DEPARTMENT_OTHER): Payer: Medicare Other | Admitting: Lab

## 2011-05-01 ENCOUNTER — Other Ambulatory Visit: Payer: Self-pay | Admitting: *Deleted

## 2011-05-01 DIAGNOSIS — I82C19 Acute embolism and thrombosis of unspecified internal jugular vein: Secondary | ICD-10-CM

## 2011-05-01 DIAGNOSIS — Z5181 Encounter for therapeutic drug level monitoring: Secondary | ICD-10-CM

## 2011-05-01 DIAGNOSIS — Z7901 Long term (current) use of anticoagulants: Secondary | ICD-10-CM

## 2011-05-01 LAB — PROTIME-INR
INR: 2.8 (ref 2.00–3.50)
Protime: 33.6 Seconds — ABNORMAL HIGH (ref 10.6–13.4)

## 2011-05-04 ENCOUNTER — Emergency Department (INDEPENDENT_AMBULATORY_CARE_PROVIDER_SITE_OTHER)
Admission: EM | Admit: 2011-05-04 | Discharge: 2011-05-04 | Disposition: A | Discharge status: Home / Self Care | Payer: Medicare Other | Source: Home / Self Care | Attending: Family Medicine | Admitting: Family Medicine

## 2011-05-04 ENCOUNTER — Other Ambulatory Visit: Payer: Self-pay | Admitting: Oncology

## 2011-05-04 ENCOUNTER — Telehealth: Payer: Self-pay | Admitting: *Deleted

## 2011-05-04 DIAGNOSIS — J209 Acute bronchitis, unspecified: Secondary | ICD-10-CM

## 2011-05-04 MED ORDER — AZITHROMYCIN 250 MG PO TABS: ORAL_TABLET | ORAL | Status: DC

## 2011-05-04 MED ORDER — DEXTROMETHORPHAN POLISTIREX 30 MG/5ML PO LQCR: 60.0000 mg | ORAL | Status: DC | PRN

## 2011-05-04 NOTE — Telephone Encounter (Signed)
Pt calls and c/o congestion and needing a zpack, appt is offered, pt then states she has a hard time breathing and would like to be seen this pm, she is referred to urg care or ED and ask not to drive, states family member will drive.

## 2011-05-04 NOTE — Telephone Encounter (Signed)
OK 

## 2011-05-04 NOTE — ED Provider Notes (Signed)
History     CSN: [id number]  Arrival date & time 05/04/11  1702   First MD Initiated Contact with Patient 05/04/11 1844      No chief complaint on file.   (Consider location/radiation/quality/duration/timing/severity/associated sxs/prior treatment) Patient is a 61 y.o. female presenting with URI. The history is provided by the spouse.  URI The primary symptoms include sore throat, cough and wheezing. Primary symptoms do not include fever, nausea, vomiting, myalgias or arthralgias. The current episode started yesterday. This is a new problem. The problem has not changed since onset. Symptoms associated with the illness include congestion and rhinorrhea.    Past Medical History  Diagnosis Date  . Depression     seasonal melancholia  . Hyperlipidemia   . Hypertension   . Arthritis     DJD, lumbar spondylosis  . Myopathy     not statin related, cause unknown   . Breast cancer   . Back pain   . Edema extremities   . Chemotherapy follow-up examination 03/06/2011  . Cough 03/06/2011    Past Surgical History  Procedure Date  . Abdominal hysterectomy     for fibroids   . Laminectomy     C5-6 Dr. Louanne Skye 2005   . Oophorectomy   . Mastectomy     bilateral     Family History  Problem Relation Age of Onset  . Cancer Mother 75    Breast cancer, mother  . Heart disease Mother 80    CAD  . Breast cancer Mother   . Cancer Father 61    colon cancer  . Colon cancer Father   . Coronary artery disease Other     History  Substance Use Topics  . Smoking status: Never Smoker   . Smokeless tobacco: Never Used  . Alcohol Use: No    OB History    Grav Para Term Preterm Abortions TAB SAB Ect Mult Living                  Review of Systems  Constitutional: Negative for fever.  HENT: Positive for congestion, sore throat and rhinorrhea.   Eyes: Negative.   Respiratory: Positive for cough and wheezing. Negative for shortness of breath.   Cardiovascular: Negative.     Gastrointestinal: Negative.  Negative for nausea and vomiting.  Musculoskeletal: Negative for myalgias and arthralgias.    Allergies  Atorvastatin and Rosuvastatin  Home Medications   Current Outpatient Rx  Name Route Sig Dispense Refill  . ALBUTEROL SULFATE (2.5 MG/3ML) 0.083% IN NEBU Nebulization Take 2.5 mg by nebulization 2 (two) times daily as needed.      . ALBUTEROL 90 MCG/ACT IN AERS Inhalation Inhale 2 puffs into the lungs every 4 (four) hours as needed.      . AZITHROMYCIN 250 MG PO TABS  Take as directed on pack 6 each 0  . CARVEDILOL 3.125 MG PO TABS Oral Take 1 tablet (3.125 mg total) by mouth 2 (two) times daily with a meal. 60 tablet 6  . DEXTROMETHORPHAN POLISTIREX ER 30 MG/5ML PO LQCR Oral Take 10 mLs (60 mg total) by mouth as needed for cough. 89 mL 0  . ESOMEPRAZOLE MAGNESIUM 40 MG PO CPDR Oral Take 40 mg by mouth daily before breakfast.      . FERROUS SULFATE 325 (65 FE) MG PO TABS Oral Take 325 mg by mouth 3 (three) times daily with meals.     Marland Kitchen FLUTICASONE-SALMETEROL 250-50 MCG/DOSE IN AEPB Inhalation Inhale 1 puff into the  lungs 2 (two) times daily.      . FUROSEMIDE 40 MG PO TABS Oral Take 40 mg by mouth daily.      Marland Kitchen KLOR-CON M20 20 MEQ PO TBCR Oral Take 1 tablet by mouth daily.    Marland Kitchen LETROZOLE 2.5 MG PO TABS Oral Take 1 tablet by mouth Daily.    Marland Kitchen LORAZEPAM 1 MG PO TABS Oral Take 1 mg by mouth daily as needed. For anxiety     . ONE-DAILY MULTI VITAMINS PO TABS Oral Take 1 tablet by mouth daily.     Marland Kitchen OLMESARTAN MEDOXOMIL 40 MG PO TABS Oral Take 40 mg by mouth daily.      Marland Kitchen SPIRONOLACTONE 25 MG PO TABS Oral Take 25 mg by mouth daily.      . TRAMADOL HCL 50 MG PO TABS Oral Take 50 mg by mouth every 6 (six) hours as needed. For pain     . WARFARIN SODIUM 10 MG PO TABS Oral Take 5 mg by mouth daily.        BP 168/88  Pulse 89  Temp(Src) 97.6 F (36.4 C) (Oral)  Resp 20  SpO2 97%  LMP 12/31/1968  Physical Exam  Nursing note and vitals  reviewed. Constitutional: She appears well-developed and well-nourished.  HENT:  Head: Normocephalic.  Right Ear: External ear normal.  Mouth/Throat: Oropharynx is clear and moist.  Eyes: Pupils are equal, round, and reactive to light.  Neck: Normal range of motion. Neck supple.  Cardiovascular: Normal rate, normal heart sounds and intact distal pulses.   Pulmonary/Chest: Effort normal. She has rhonchi.  Lymphadenopathy:    She has no cervical adenopathy.  Skin: Skin is warm and dry.    ED Course  Procedures (including critical care time)  Labs Reviewed - No data to display No results found.   1. Bronchitis, acute       MDM          Pauline Good, MD 05/04/11 1900

## 2011-05-06 ENCOUNTER — Emergency Department (HOSPITAL_COMMUNITY): Payer: Medicare Other

## 2011-05-06 ENCOUNTER — Encounter (HOSPITAL_COMMUNITY): Payer: Self-pay | Admitting: Emergency Medicine

## 2011-05-06 ENCOUNTER — Emergency Department (HOSPITAL_COMMUNITY)
Admission: EM | Admit: 2011-05-06 | Discharge: 2011-05-06 | Disposition: A | Discharge status: Home / Self Care | Payer: Medicare Other | Attending: Emergency Medicine | Admitting: Emergency Medicine

## 2011-05-06 DIAGNOSIS — J40 Bronchitis, not specified as acute or chronic: Secondary | ICD-10-CM | POA: Insufficient documentation

## 2011-05-06 DIAGNOSIS — J3489 Other specified disorders of nose and nasal sinuses: Secondary | ICD-10-CM | POA: Insufficient documentation

## 2011-05-06 DIAGNOSIS — R0602 Shortness of breath: Secondary | ICD-10-CM | POA: Insufficient documentation

## 2011-05-06 DIAGNOSIS — Z7901 Long term (current) use of anticoagulants: Secondary | ICD-10-CM | POA: Insufficient documentation

## 2011-05-06 DIAGNOSIS — F329 Major depressive disorder, single episode, unspecified: Secondary | ICD-10-CM | POA: Insufficient documentation

## 2011-05-06 DIAGNOSIS — F3289 Other specified depressive episodes: Secondary | ICD-10-CM | POA: Insufficient documentation

## 2011-05-06 DIAGNOSIS — Z79899 Other long term (current) drug therapy: Secondary | ICD-10-CM | POA: Insufficient documentation

## 2011-05-06 DIAGNOSIS — E785 Hyperlipidemia, unspecified: Secondary | ICD-10-CM | POA: Insufficient documentation

## 2011-05-06 DIAGNOSIS — Z853 Personal history of malignant neoplasm of breast: Secondary | ICD-10-CM | POA: Insufficient documentation

## 2011-05-06 DIAGNOSIS — I10 Essential (primary) hypertension: Secondary | ICD-10-CM | POA: Insufficient documentation

## 2011-05-06 DIAGNOSIS — R059 Cough, unspecified: Secondary | ICD-10-CM | POA: Insufficient documentation

## 2011-05-06 DIAGNOSIS — R05 Cough: Secondary | ICD-10-CM | POA: Insufficient documentation

## 2011-05-06 MED ORDER — IPRATROPIUM BROMIDE 0.02 % IN SOLN
0.5000 mg | Freq: Once | RESPIRATORY_TRACT | Status: AC
  Administered 2011-05-06: 0.5 mg via RESPIRATORY_TRACT
  Filled 2011-05-06: qty 2.5

## 2011-05-06 MED ORDER — PREDNISONE 20 MG PO TABS
60.0000 mg | ORAL_TABLET | Freq: Once | ORAL | Status: AC
  Administered 2011-05-06: 60 mg via ORAL
  Filled 2011-05-06: qty 3

## 2011-05-06 MED ORDER — ALBUTEROL SULFATE HFA 108 (90 BASE) MCG/ACT IN AERS
2.0000 | INHALATION_SPRAY | Freq: Once | RESPIRATORY_TRACT | Status: AC
  Administered 2011-05-06: 2 via RESPIRATORY_TRACT
  Filled 2011-05-06: qty 6.7

## 2011-05-06 MED ORDER — HYDROCOD POLST-CHLORPHEN POLST 10-8 MG/5ML PO LQCR
5.0000 mL | Freq: Once | ORAL | Status: AC
  Administered 2011-05-06: 5 mL via ORAL
  Filled 2011-05-06: qty 5

## 2011-05-06 MED ORDER — HYDROCODONE-HOMATROPINE 5-1.5 MG/5ML PO SYRP: 5.0000 mL | ORAL_SOLUTION | Freq: Four times a day (QID) | ORAL | Status: DC | PRN

## 2011-05-06 MED ORDER — ALBUTEROL SULFATE (5 MG/ML) 0.5% IN NEBU
5.0000 mg | INHALATION_SOLUTION | Freq: Once | RESPIRATORY_TRACT | Status: AC
  Administered 2011-05-06: 5 mg via RESPIRATORY_TRACT
  Filled 2011-05-06: qty 1
  Filled 2011-05-06: qty 0.5

## 2011-05-06 MED ORDER — PREDNISONE 10 MG PO TABS: 40.0000 mg | ORAL_TABLET | Freq: Every day | ORAL | Status: DC

## 2011-05-06 NOTE — ED Provider Notes (Signed)
Medical screening examination/treatment/procedure(s) were performed by non-physician practitioner and as supervising physician I was immediately available for consultation/collaboration.  Leota Jacobsen, MD 05/06/11 2035

## 2011-05-06 NOTE — Discharge Instructions (Signed)
Continue your z-pack. Take albuterol inhaler 2 puffs every 4 hrs for the next 3 days then as needed. Take prednisone as prescribed until all gone. Take hycodan for cough as prescribed as needed. Do not drive if taking. Follow up with your doctor in 3days for recheck. Return if worsening.    Bronchitis Bronchitis is the body's way of reacting to injury and/or infection (inflammation) of the bronchi. Bronchi are the air tubes that extend from the windpipe into the lungs. If the inflammation becomes severe, it may cause shortness of breath. CAUSES  Inflammation may be caused by:  A virus.   Germs (bacteria).   Dust.   Allergens.   Pollutants and many other irritants.  The cells lining the bronchial tree are covered with tiny hairs (cilia). These constantly beat upward, away from the lungs, toward the mouth. This keeps the lungs free of pollutants. When these cells become too irritated and are unable to do their job, mucus begins to develop. This causes the characteristic cough of bronchitis. The cough clears the lungs when the cilia are unable to do their job. Without either of these protective mechanisms, the mucus would settle in the lungs. Then you would develop pneumonia. Smoking is a common cause of bronchitis and can contribute to pneumonia. Stopping this habit is the single most important thing you can do to help yourself. TREATMENT   Your caregiver may prescribe an antibiotic if the cough is caused by bacteria. Also, medicines that open up your airways make it easier to breathe. Your caregiver may also recommend or prescribe an expectorant. It will loosen the mucus to be coughed up. Only take over-the-counter or prescription medicines for pain, discomfort, or fever as directed by your caregiver.   Removing whatever causes the problem (smoking, for example) is critical to preventing the problem from getting worse.   Cough suppressants may be prescribed for relief of cough symptoms.    Inhaled medicines may be prescribed to help with symptoms now and to help prevent problems from returning.   For those with recurrent (chronic) bronchitis, there may be a need for steroid medicines.  SEEK IMMEDIATE MEDICAL CARE IF:   During treatment, you develop more pus-like mucus (purulent sputum).   You have a fever.   Your baby is older than 3 months with a rectal temperature of 102 F (38.9 C) or higher.   Your baby is 63 months old or younger with a rectal temperature of 100.4 F (38 C) or higher.   You become progressively more ill.   You have increased difficulty breathing, wheezing, or shortness of breath.  It is necessary to seek immediate medical care if you are elderly or sick from any other disease. MAKE SURE YOU:   Understand these instructions.   Will watch your condition.   Will get help right away if you are not doing well or get worse.  Document Released: 03/09/2005 Document Revised: 11/19/2010 Document Reviewed: 01/17/2008 [facility] Patient Information 2012 [place].Bronchitis Bronchitis is the body's way of reacting to injury and/or infection (inflammation) of the bronchi. Bronchi are the air tubes that extend from the windpipe into the lungs. If the inflammation becomes severe, it may cause shortness of breath. CAUSES  Inflammation may be caused by:  A virus.   Germs (bacteria).   Dust.   Allergens.   Pollutants and many other irritants.  The cells lining the bronchial tree are covered with tiny hairs (cilia). These constantly beat upward, away from the lungs, toward  the mouth. This keeps the lungs free of pollutants. When these cells become too irritated and are unable to do their job, mucus begins to develop. This causes the characteristic cough of bronchitis. The cough clears the lungs when the cilia are unable to do their job. Without either of these protective mechanisms, the mucus would settle in the lungs. Then you would develop  pneumonia. Smoking is a common cause of bronchitis and can contribute to pneumonia. Stopping this habit is the single most important thing you can do to help yourself. TREATMENT   Your caregiver may prescribe an antibiotic if the cough is caused by bacteria. Also, medicines that open up your airways make it easier to breathe. Your caregiver may also recommend or prescribe an expectorant. It will loosen the mucus to be coughed up. Only take over-the-counter or prescription medicines for pain, discomfort, or fever as directed by your caregiver.   Removing whatever causes the problem (smoking, for example) is critical to preventing the problem from getting worse.   Cough suppressants may be prescribed for relief of cough symptoms.   Inhaled medicines may be prescribed to help with symptoms now and to help prevent problems from returning.   For those with recurrent (chronic) bronchitis, there may be a need for steroid medicines.  SEEK IMMEDIATE MEDICAL CARE IF:   During treatment, you develop more pus-like mucus (purulent sputum).   You have a fever.   Your baby is older than 3 months with a rectal temperature of 102 F (38.9 C) or higher.   Your baby is 58 months old or younger with a rectal temperature of 100.4 F (38 C) or higher.   You become progressively more ill.   You have increased difficulty breathing, wheezing, or shortness of breath.  It is necessary to seek immediate medical care if you are elderly or sick from any other disease. MAKE SURE YOU:   Understand these instructions.   Will watch your condition.   Will get help right away if you are not doing well or get worse.  Document Released: 03/09/2005 Document Revised: 11/19/2010 Document Reviewed: 01/17/2008 [facility] Patient Information 2012 [place].

## 2011-05-06 NOTE — ED Notes (Signed)
Has been wheezing x 3 days w/ cough and sob

## 2011-05-06 NOTE — ED Provider Notes (Signed)
History     CSN: [id number]  Arrival date & time 05/06/11  1144   First MD Initiated Contact with Patient 05/06/11 1233      Chief Complaint  Patient presents with  . Shortness of Breath    (Consider location/radiation/quality/duration/timing/severity/associated sxs/prior treatment) Patient is a 61 y.o. female presenting with shortness of breath. The history is provided by the patient.  Shortness of Breath  The current episode started 3 to 5 days ago. The onset was gradual. The problem occurs continuously. The problem has been gradually worsening. The problem is moderate. Associated symptoms include cough, shortness of breath and wheezing. Pertinent negatives include no chest pain, no chest pressure and no fever.  Pt with URI symptoms, generalized malaise, cough, shortness of breath, wheezing. States went to UC two days ago, was given Zpack, delsym syrup. States cough is getting worse. Unable to sleep at night. Denies chest pain, fever, n/v/d, swelling of upper or lower extremities.   Past Medical History  Diagnosis Date  . Depression     seasonal melancholia  . Hyperlipidemia   . Hypertension   . Arthritis     DJD, lumbar spondylosis  . Myopathy     not statin related, cause unknown   . Breast cancer   . Back pain   . Edema extremities   . Chemotherapy follow-up examination 03/06/2011  . Cough 03/06/2011    Past Surgical History  Procedure Date  . Abdominal hysterectomy     for fibroids   . Laminectomy     C5-6 Dr. Louanne Skye 2005   . Oophorectomy   . Mastectomy     bilateral     Family History  Problem Relation Age of Onset  . Cancer Mother 49    Breast cancer, mother  . Heart disease Mother 1    CAD  . Breast cancer Mother   . Cancer Father 46    colon cancer  . Colon cancer Father   . Coronary artery disease Other     History  Substance Use Topics  . Smoking status: Never Smoker   . Smokeless tobacco: Never Used  . Alcohol Use: No    OB History    Grav Para Term Preterm Abortions TAB SAB Ect Mult Living                  Review of Systems  Constitutional: Negative for fever and chills.  HENT: Negative.   Eyes: Negative.   Respiratory: Positive for cough, shortness of breath and wheezing.   Cardiovascular: Negative for chest pain and leg swelling.  Gastrointestinal: Negative.   Genitourinary: Negative.   Musculoskeletal: Negative.   Skin: Negative.   Neurological: Negative.   Psychiatric/Behavioral: Negative.     Allergies  Atorvastatin and Rosuvastatin  Home Medications   Current Outpatient Rx  Name Route Sig Dispense Refill  . ALBUTEROL SULFATE (2.5 MG/3ML) 0.083% IN NEBU Nebulization Take 2.5 mg by nebulization 2 (two) times daily as needed.      . ALBUTEROL 90 MCG/ACT IN AERS Inhalation Inhale 2 puffs into the lungs every 4 (four) hours as needed.      . AZITHROMYCIN 250 MG PO TABS  Take as directed on pack 6 each 0  . CARVEDILOL 3.125 MG PO TABS Oral Take 1 tablet (3.125 mg total) by mouth 2 (two) times daily with a meal. 60 tablet 6  . DEXTROMETHORPHAN POLISTIREX ER 30 MG/5ML PO LQCR Oral Take 10 mLs (60 mg total) by mouth as needed for  cough. 89 mL 0  . ESOMEPRAZOLE MAGNESIUM 40 MG PO CPDR Oral Take 40 mg by mouth daily before breakfast.      . FERROUS SULFATE 325 (65 FE) MG PO TABS Oral Take 325 mg by mouth 3 (three) times daily with meals.     Marland Kitchen FLUTICASONE-SALMETEROL 250-50 MCG/DOSE IN AEPB Inhalation Inhale 1 puff into the lungs 2 (two) times daily.      . FUROSEMIDE 40 MG PO TABS Oral Take 40 mg by mouth daily.      Marland Kitchen KLOR-CON M20 20 MEQ PO TBCR Oral Take 1 tablet by mouth daily.    Marland Kitchen LETROZOLE 2.5 MG PO TABS Oral Take 1 tablet by mouth Daily.    Marland Kitchen LORAZEPAM 1 MG PO TABS Oral Take 1 mg by mouth daily as needed. For anxiety     . ONE-DAILY MULTI VITAMINS PO TABS Oral Take 1 tablet by mouth daily.     Marland Kitchen OLMESARTAN MEDOXOMIL 40 MG PO TABS Oral Take 40 mg by mouth daily.      Marland Kitchen SPIRONOLACTONE 25 MG PO TABS Oral  Take 25 mg by mouth daily.      . TRAMADOL HCL 50 MG PO TABS Oral Take 50 mg by mouth every 6 (six) hours as needed. For pain     . WARFARIN SODIUM 10 MG PO TABS Oral Take 5 mg by mouth daily.        BP 112/62  Pulse 78  Temp 98.8 F (37.1 C)  Resp 20  SpO2 92%  LMP 12/31/1968  Physical Exam  Nursing note and vitals reviewed. Constitutional: She is oriented to person, place, and time. She appears well-developed and well-nourished. No distress.  HENT:  Head: Normocephalic and atraumatic.  Right Ear: Tympanic membrane, external ear and ear canal normal.  Left Ear: Tympanic membrane, external ear and ear canal normal.  Nose: Rhinorrhea present.  Mouth/Throat: Uvula is midline, oropharynx is clear and moist and mucous membranes are normal.  Cardiovascular: Normal rate, regular rhythm and normal heart sounds.   Pulmonary/Chest: Effort normal. No respiratory distress. She has wheezes.       Inspiratory and expiratory wheezes bilaterally  Abdominal: Soft. Bowel sounds are normal. She exhibits no distension. There is no tenderness.  Musculoskeletal: She exhibits no edema.  Neurological: She is alert and oriented to person, place, and time.  Skin: Skin is warm and dry.  Psychiatric: She has a normal mood and affect.    ED Course  Procedures (including critical care time)  PT with URI symptoms, cough. Started on zpack 2 days ago, no improvement. Pt is coughing on exam, wheezes in all lung fields. Will get CXR, will give a neb treatment, prednisone. Will monitor.   Dg Chest 2 View  05/06/2011  *RADIOLOGY REPORT*  Clinical Data: Shortness of breath, cough.  CHEST - 2 VIEW  Comparison: 12/21/2010  Findings: Heart and mediastinal contours are within normal limits. No focal opacities or effusions.  No acute bony abnormality.  Mild peribronchial thickening.  IMPRESSION: Mild bronchitic changes.  Original Report Authenticated By: Raelyn Number, M.D.   2:44 PM Pt feeling much better after  nebs, steroids, tussionex. Pt states she wants to go home, no more complaints. Pt has no CP, doubt ACS, Pt is not tachycardic, not hypoxic, on coumadin, doubt PE. Will d/c home with close follow up with PCP.  No diagnosis found.    [place], PA 05/06/11  1446 

## 2011-05-06 NOTE — ED Notes (Signed)
Pt finished breathing treatment and thinks it helped a lot

## 2011-05-06 NOTE — ED Notes (Signed)
Pt given instructions on using inhaler.  Pt demonstrated use and is comfortable.

## 2011-05-06 NOTE — ED Notes (Addendum)
Pt was seen at urgent care 2 days ago for chest congestion was given a zpack but not getting better.    Co cough congestion, pain in rt side of chest from coughing.  Bilateral breast cancer pt in remission.

## 2011-05-08 ENCOUNTER — Telehealth (HOSPITAL_COMMUNITY): Payer: Self-pay | Admitting: *Deleted

## 2011-05-08 NOTE — Telephone Encounter (Signed)
[name] called today.  She believes she is experiencing some side effects from the new medication she is taking,(carvedilol)  She does have bronchitis, but has been having a lot of wheezing.  She would like a call back.

## 2011-05-08 NOTE — Telephone Encounter (Signed)
Returned call from patient.  Over the last 7-10 days she has had increased dyspnea, wheezing and fatigue.  She has been evaluated at an Urgent care and then the ER Wednesday.  She was given prednisone, zpak and cough medicine.  She has been taking this for 3 days without much relief.  She was calling to see if the addition of coreg on 2/1 could be an issue.  With her history of asthma this may be exacerbating her symptoms.  After discussing with Dr. Haroldine Laws we will stop the coreg for now.  She is agreeable to this and is to call if symptoms persist/get worse.

## 2011-05-09 ENCOUNTER — Encounter (HOSPITAL_COMMUNITY): Payer: Self-pay | Admitting: Emergency Medicine

## 2011-05-09 ENCOUNTER — Emergency Department (HOSPITAL_COMMUNITY): Payer: Medicare Other

## 2011-05-09 ENCOUNTER — Other Ambulatory Visit: Payer: Self-pay

## 2011-05-09 ENCOUNTER — Emergency Department (HOSPITAL_COMMUNITY)
Admission: EM | Admit: 2011-05-09 | Discharge: 2011-05-09 | Disposition: A | Discharge status: Home / Self Care | Payer: Medicare Other | Attending: Emergency Medicine | Admitting: Emergency Medicine

## 2011-05-09 DIAGNOSIS — Z853 Personal history of malignant neoplasm of breast: Secondary | ICD-10-CM | POA: Insufficient documentation

## 2011-05-09 DIAGNOSIS — R0602 Shortness of breath: Secondary | ICD-10-CM | POA: Insufficient documentation

## 2011-05-09 DIAGNOSIS — M7989 Other specified soft tissue disorders: Secondary | ICD-10-CM | POA: Insufficient documentation

## 2011-05-09 DIAGNOSIS — R05 Cough: Secondary | ICD-10-CM | POA: Insufficient documentation

## 2011-05-09 DIAGNOSIS — I10 Essential (primary) hypertension: Secondary | ICD-10-CM | POA: Insufficient documentation

## 2011-05-09 DIAGNOSIS — J40 Bronchitis, not specified as acute or chronic: Secondary | ICD-10-CM | POA: Insufficient documentation

## 2011-05-09 DIAGNOSIS — E785 Hyperlipidemia, unspecified: Secondary | ICD-10-CM | POA: Insufficient documentation

## 2011-05-09 DIAGNOSIS — R059 Cough, unspecified: Secondary | ICD-10-CM | POA: Insufficient documentation

## 2011-05-09 DIAGNOSIS — M129 Arthropathy, unspecified: Secondary | ICD-10-CM | POA: Insufficient documentation

## 2011-05-09 DIAGNOSIS — F329 Major depressive disorder, single episode, unspecified: Secondary | ICD-10-CM | POA: Insufficient documentation

## 2011-05-09 DIAGNOSIS — Z79899 Other long term (current) drug therapy: Secondary | ICD-10-CM | POA: Insufficient documentation

## 2011-05-09 DIAGNOSIS — F3289 Other specified depressive episodes: Secondary | ICD-10-CM | POA: Insufficient documentation

## 2011-05-09 LAB — BASIC METABOLIC PANEL
BUN: 37 mg/dL — ABNORMAL HIGH (ref 6–23)
CO2: 27 mEq/L (ref 19–32)
Calcium: 10.3 mg/dL (ref 8.4–10.5)
Chloride: 105 mEq/L (ref 96–112)
Creatinine, Ser: 1.57 mg/dL — ABNORMAL HIGH (ref 0.50–1.10)
GFR calc Af Amer: 40 mL/min — ABNORMAL LOW (ref >90)
GFR calc non Af Amer: 34 mL/min — ABNORMAL LOW (ref >90)
Glucose, Bld: 122 mg/dL — ABNORMAL HIGH (ref 70–99)
Potassium: 4.7 mEq/L (ref 3.5–5.1)
Sodium: 142 mEq/L (ref 135–145)

## 2011-05-09 LAB — CBC
HCT: 32.7 % — ABNORMAL LOW (ref 36.0–46.0)
Hemoglobin: 11.1 g/dL — ABNORMAL LOW (ref 12.0–15.0)
MCH: 28.4 pg (ref 26.0–34.0)
MCHC: 33.9 g/dL (ref 30.0–36.0)
MCV: 83.6 fL (ref 78.0–100.0)
Platelets: 257 10*3/uL (ref 150–400)
RBC: 3.91 MIL/uL (ref 3.87–5.11)
RDW: 14.8 % (ref 11.5–15.5)
WBC: 9.2 10*3/uL (ref 4.0–10.5)

## 2011-05-09 LAB — PRO B NATRIURETIC PEPTIDE: Pro B Natriuretic peptide (BNP): 37.1 pg/mL (ref 0–125)

## 2011-05-09 MED ORDER — BENZONATATE 100 MG PO CAPS: 100.0000 mg | ORAL_CAPSULE | Freq: Three times a day (TID) | ORAL | Status: AC

## 2011-05-09 MED ORDER — ALBUTEROL SULFATE (5 MG/ML) 0.5% IN NEBU
INHALATION_SOLUTION | RESPIRATORY_TRACT | Status: AC
  Administered 2011-05-09: 2.5 mg via RESPIRATORY_TRACT
  Filled 2011-05-09: qty 0.5

## 2011-05-09 MED ORDER — ALBUTEROL SULFATE (5 MG/ML) 0.5% IN NEBU
2.5000 mg | INHALATION_SOLUTION | Freq: Once | RESPIRATORY_TRACT | Status: AC
  Administered 2011-05-09: 2.5 mg via RESPIRATORY_TRACT

## 2011-05-09 MED ORDER — PREDNISONE 50 MG PO TABS: 50.0000 mg | ORAL_TABLET | Freq: Every day | ORAL | Status: AC

## 2011-05-09 MED ORDER — IPRATROPIUM BROMIDE 0.02 % IN SOLN
RESPIRATORY_TRACT | Status: AC
  Administered 2011-05-09: 0.5 mg via RESPIRATORY_TRACT
  Filled 2011-05-09: qty 2.5

## 2011-05-09 MED ORDER — METHYLPREDNISOLONE SODIUM SUCC 125 MG IJ SOLR
125.0000 mg | Freq: Once | INTRAMUSCULAR | Status: AC
  Administered 2011-05-09: 125 mg via INTRAVENOUS
  Filled 2011-05-09: qty 2

## 2011-05-09 MED ORDER — IPRATROPIUM BROMIDE 0.02 % IN SOLN
0.5000 mg | Freq: Once | RESPIRATORY_TRACT | Status: AC
  Administered 2011-05-09: 0.5 mg via RESPIRATORY_TRACT

## 2011-05-09 NOTE — ED Notes (Signed)
Pt c/o continued wheezing, cough and shortness of breath.  St's not getting any better.  Pt speaking in full sentences

## 2011-05-09 NOTE — ED Provider Notes (Signed)
History     CSN: [id number]  Arrival date & time 05/09/11  1800   First MD Initiated Contact with Patient 05/09/11 1936      Chief Complaint  Patient presents with  . Shortness of Breath    (Consider location/radiation/quality/duration/timing/severity/associated sxs/prior treatment) Patient is a 61 y.o. female presenting with shortness of breath. The history is provided by the patient.  Shortness of Breath  The onset was gradual. The problem has been unchanged (pt states she is not getting any better.). The problem is moderate. The symptoms are relieved by nothing. Associated symptoms include cough, shortness of breath and wheezing. Pertinent negatives include no chest pain and no fever.  Pt has history of asthma.  She was seen in the ED recently and was treated for asthma.  She has been taking her medications but it is not helping.  She had had some leg swelling but that is no different than usual.  She was started on a heart medication by her doctor before she started wheezing and had that discontinued but it has not helped.  Past Medical History  Diagnosis Date  . Depression     seasonal melancholia  . Hyperlipidemia   . Hypertension   . Arthritis     DJD, lumbar spondylosis  . Myopathy     not statin related, cause unknown   . Breast cancer   . Back pain   . Edema extremities   . Chemotherapy follow-up examination 03/06/2011  . Cough 03/06/2011    Past Surgical History  Procedure Date  . Abdominal hysterectomy     for fibroids   . Laminectomy     C5-6 Dr. Louanne Skye 2005   . Oophorectomy   . Mastectomy     bilateral     Family History  Problem Relation Age of Onset  . Cancer Mother 74    Breast cancer, mother  . Heart disease Mother 105    CAD  . Breast cancer Mother   . Cancer Father 25    colon cancer  . Colon cancer Father   . Coronary artery disease Other     History  Substance Use Topics  . Smoking status: Never Smoker   . Smokeless tobacco: Never  Used  . Alcohol Use: No    OB History    Grav Para Term Preterm Abortions TAB SAB Ect Mult Living                  Review of Systems  Constitutional: Negative for fever.  Respiratory: Positive for cough, shortness of breath and wheezing.   Cardiovascular: Negative for chest pain.  All other systems reviewed and are negative.    Allergies  Atorvastatin and Rosuvastatin  Home Medications   Current Outpatient Rx  Name Route Sig Dispense Refill  . ALBUTEROL SULFATE (2.5 MG/3ML) 0.083% IN NEBU Nebulization Take 2.5 mg by nebulization 2 (two) times daily as needed. For wheezing    . ALBUTEROL 90 MCG/ACT IN AERS Inhalation Inhale 2 puffs into the lungs every 4 (four) hours as needed. For wheezing    . AZITHROMYCIN 250 MG PO TABS Oral Take 250-500 mg by mouth See admin instructions. Take 2 tabs on day 1, then 1 tab daily x4 days    . ESOMEPRAZOLE MAGNESIUM 40 MG PO CPDR Oral Take 40 mg by mouth daily before breakfast.      . FERROUS SULFATE 325 (65 FE) MG PO TABS Oral Take 325 mg by mouth 3 (three) times daily  with meals.     Marland Kitchen FLUTICASONE-SALMETEROL 250-50 MCG/DOSE IN AEPB Inhalation Inhale 1 puff into the lungs 2 (two) times daily.      . FUROSEMIDE 40 MG PO TABS Oral Take 40 mg by mouth daily.      Marland Kitchen KLOR-CON M20 20 MEQ PO TBCR Oral Take 1 tablet by mouth daily.    Marland Kitchen LETROZOLE 2.5 MG PO TABS Oral Take 1 tablet by mouth Daily.    Marland Kitchen LORAZEPAM 1 MG PO TABS Oral Take 1 mg by mouth daily as needed. For anxiety     . ONE-DAILY MULTI VITAMINS PO TABS Oral Take 1 tablet by mouth daily.     Marland Kitchen OLMESARTAN MEDOXOMIL 40 MG PO TABS Oral Take 40 mg by mouth daily.      Marland Kitchen PREDNISONE 10 MG PO TABS Oral Take 40 mg by mouth daily.    Marland Kitchen SPIRONOLACTONE 25 MG PO TABS Oral Take 25 mg by mouth daily.      . TRAMADOL HCL 50 MG PO TABS Oral Take 50 mg by mouth every 6 (six) hours as needed. For pain     . WARFARIN SODIUM 10 MG PO TABS Oral Take 5 mg by mouth daily.        BP 164/85  Pulse 107   Temp(Src) 98.3 F (36.8 C) (Oral)  Resp 20  SpO2 98%  LMP 12/31/1968  Physical Exam  Nursing note and vitals reviewed. Constitutional: She appears well-developed and well-nourished. No distress.  HENT:  Head: Normocephalic and atraumatic.  Right Ear: External ear normal.  Left Ear: External ear normal.  Eyes: Conjunctivae are normal. Right eye exhibits no discharge. Left eye exhibits no discharge. No scleral icterus.  Neck: Neck supple. No tracheal deviation present.  Cardiovascular: Normal rate, regular rhythm and intact distal pulses.   Pulmonary/Chest: No stridor. No respiratory distress. She has wheezes. She has no rales.  Abdominal: Soft. Bowel sounds are normal. She exhibits no distension. There is no tenderness. There is no rebound and no guarding.  Musculoskeletal: She exhibits edema. She exhibits no tenderness.  Neurological: She is alert. She has normal strength. No sensory deficit. Cranial nerve deficit:  no gross defecits noted. She exhibits normal muscle tone. She displays no seizure activity. Coordination normal.  Skin: Skin is warm and dry. No rash noted.  Psychiatric: She has a normal mood and affect.    ED Course  Procedures (including critical care time)  Date: 05/09/2011  Rate: 65  Rhythm: normal sinus rhythm  QRS Axis: normal  Intervals: normal  ST/T Wave abnormalities: normal  Conduction Disutrbances:none  Narrative Interpretation:   Old EKG Reviewed: unchanged   Labs Reviewed  BASIC METABOLIC PANEL - Abnormal; Notable for the following:    Glucose, Bld 122 (*)    BUN 37 (*)    Creatinine, Ser 1.57 (*)    GFR calc non Af Amer 34 (*)    GFR calc Af Amer 40 (*)    All other components within normal limits  CBC - Abnormal; Notable for the following:    Hemoglobin 11.1 (*)    HCT 32.7 (*)    All other components within normal limits  PRO B NATRIURETIC PEPTIDE   Dg Chest 2 View  05/09/2011  *RADIOLOGY REPORT*  Clinical Data: Wheezing and cough;  shortness of breath.  History of bilateral breast cancer.  CHEST - 2 VIEW  Comparison: Chest radiograph performed 05/06/2011  Findings: The lungs are well-aerated.  Mild peribronchial thickening is again noted.  There is no evidence of focal opacification, pleural effusion or pneumothorax.  The heart is normal in size; the mediastinal contour is within normal limits.  No acute osseous abnormalities are seen.  Cervical spinal fusion hardware is partially imaged.  IMPRESSION: No evidence of focal consolidation; mild bronchitic changes again noted.  Original Report Authenticated By: Santa Lighter, M.D.     1. Cough   2. Bronchitis       MDM  Patient was given one albuterol nebulizer treatment. She's responded well. The respiratory therapist checked her peak flow and it was 110% of predicted. At this time there does not appear to be evidence of pneumonia or congestive heart failure. Patient is breathing easily here with a normal oxygen saturation. I will try her on a course of oral steroids and give her an additional cough medication. She's encouraged to follow up with her primary Dr. next week if the symptoms persist        Kathalene Frames, MD 05/09/11 2159

## 2011-05-09 NOTE — ED Notes (Signed)
Pt states she has been having trouble breathing since Monday. Pt states that she was here this week and her breathing is not getting any better. Pt states she has been using all medicine as prescribed but is still feeling awful.

## 2011-05-09 NOTE — Discharge Instructions (Signed)

## 2011-05-09 NOTE — ED Notes (Signed)
Pt d/c home in NAD. Pt voiced understanding of follow-up instructions.

## 2011-05-13 ENCOUNTER — Encounter (INDEPENDENT_AMBULATORY_CARE_PROVIDER_SITE_OTHER): Payer: Medicare Other | Admitting: General Surgery

## 2011-05-13 ENCOUNTER — Encounter: Payer: Self-pay | Admitting: Pulmonary Disease

## 2011-05-13 ENCOUNTER — Ambulatory Visit (INDEPENDENT_AMBULATORY_CARE_PROVIDER_SITE_OTHER): Payer: Medicare Other | Admitting: Pulmonary Disease

## 2011-05-13 DIAGNOSIS — J45991 Cough variant asthma: Secondary | ICD-10-CM

## 2011-05-13 MED ORDER — PREDNISONE 10 MG PO TABS: ORAL_TABLET | ORAL | Status: DC

## 2011-05-13 MED ORDER — PROMETHAZINE-PHENYLEPHRINE 6.25-5 MG/5ML PO SYRP: 5.0000 mL | ORAL_SOLUTION | ORAL | Status: DC | PRN

## 2011-05-13 NOTE — Assessment & Plan Note (Signed)
PFTs 09/09 with FEF 25-75% decrease by over 25% with methacholine - s/o bronchial hyperreactivity,  and she has diffuse wheezing in the clinic on 11/19 Unclear trigger sinuses/ GERD ACE & coreg stopped Prednisone taper after 50 mg x 2days is done - Take 4 tabs  daily with food x 2 days, then 3 tabs daily x 2 days, then 2 tabs daily x 2 days, then 1 tab daily x2 days then stop. #20  Promethazine- codeine syrup 2.5-45ml twice daily as needed for cough Stay on advair Use albuterol inhaler as needed for wheezing, If no better can use neb Stay on nexium

## 2011-05-13 NOTE — Patient Instructions (Addendum)
Prednisone taper after your current Rx is done - Take 4 tabs  daily with food x 2 days, then 3 tabs daily x 2 days, then 2 tabs daily x 2 days, then 1 tab daily x2 days then stop. #20  Promethazine- codeine syrup 2.5-63ml twice daily as needed for cough Stay on advair Use albuterol inhaler as needed for wheezing, If no better can use neb Stay on nexium

## 2011-05-13 NOTE — Progress Notes (Signed)
  Subjective:    Patient ID: [name], female    DOB: Mar 31, 1950, 61 y.o.   MRN: [id number]  HPI 60/F, never smoker, referred for FU of recurrent cough & wheezing Attributed to cough variant asthma , sinuses & GERD She reports wheezing when lying down especially in spring & fall for the last 3 yrs when she was given a diagnosis of asthma. She takes advair & claims compliance during this season. Dyspnea is present on climbing stairs.  She is on chemotherapy for Breast cancer (Dr Humphrey Rolls) . Grade 1 diastolic dysfunction was noted by Dr Haroldine Laws on echo. She reports pedal edema & has been taking lasix daily. She has been on lisinopril for about 2 yrs.  CXR 1/12 no infiltrates, RT portocath  Reviewed spirometry 9/09 wnl & today -no obstruction. Methacholine challenge 9/09 drop in smaller airways >> benicar instead of lisinopril, added singulair   02/26/2011  No better  c/o wheezing worse at night. no cough. 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 Cough - clear mucus Coreg was stopped NO breakthrough reflux on nexium  No PND, orthopnea, pedal edema   Review of Systems Patient denies significant dyspnea,cough, hemoptysis,  chest pain, palpitations, pedal edema, orthopnea, paroxysmal nocturnal dyspnea, lightheadedness, nausea, vomiting, abdominal or  leg pains      Objective:   Physical Exam  Gen. Pleasant, well-nourished, in no distress ENT - no lesions, no post nasal drip Neck: No JVD, no thyromegaly, no carotid bruits Lungs: no use of accessory muscles, no  dullness to percussion, clear without rales or rhonchi  Cardiovascular: Rhythm regular, heart sounds  normal, no murmurs or gallops, no peripheral edema Musculoskeletal: No deformities, no cyanosis or clubbing         Assessment & Plan:

## 2011-06-04 ENCOUNTER — Ambulatory Visit (INDEPENDENT_AMBULATORY_CARE_PROVIDER_SITE_OTHER): Payer: Medicare Other | Admitting: Internal Medicine

## 2011-06-04 ENCOUNTER — Encounter: Payer: Self-pay | Admitting: Internal Medicine

## 2011-06-04 DIAGNOSIS — A6009 Herpesviral infection of other urogenital tract: Secondary | ICD-10-CM | POA: Insufficient documentation

## 2011-06-04 DIAGNOSIS — A6000 Herpesviral infection of urogenital system, unspecified: Secondary | ICD-10-CM

## 2011-06-04 DIAGNOSIS — Z79899 Other long term (current) drug therapy: Secondary | ICD-10-CM

## 2011-06-04 DIAGNOSIS — E119 Type 2 diabetes mellitus without complications: Secondary | ICD-10-CM

## 2011-06-04 LAB — GLUCOSE, CAPILLARY: Glucose-Capillary: 148 mg/dL — ABNORMAL HIGH (ref 70–99)

## 2011-06-04 LAB — POCT GLYCOSYLATED HEMOGLOBIN (HGB A1C): Hemoglobin A1C: 6.8

## 2011-06-04 MED ORDER — VALACYCLOVIR HCL 500 MG PO TABS: 500.0000 mg | ORAL_TABLET | Freq: Two times a day (BID) | ORAL | Status: DC

## 2011-06-04 MED ORDER — ACYCLOVIR 5 % EX OINT: TOPICAL_OINTMENT | CUTANEOUS | Status: DC

## 2011-06-04 NOTE — Progress Notes (Signed)
Subjective:     Patient ID: [name], female   DOB: [birth year], 61 y.o.   MRN: [id number]  HPI Pt with hx significant for intraductal breast carcinoma s/p chemo with last session Jan 2013 followed by Dr. Humphrey Rolls and internal hemorrhoids presents with complaints of "specks of blood on the toilet paper" when she wipes herself.  She thinks that they may be due to her hemorrhoids but is concerned since she had a blister in the same area about a week ago.  Her daughter is present and assists with history.  Her last colonoscopy was in 2004 and "normal" per pt report.  She also reports a feeling of "heaviness and discomfort" in the same area but no overt pain. Of note she reports recently getting over "very bad cold".  Review of Systems  Constitutional: Negative for fever and fatigue.  HENT: Negative for congestion.   Eyes: Negative for visual disturbance.  Respiratory: Negative for shortness of breath.   Cardiovascular: Negative for chest pain.  Gastrointestinal: Negative for abdominal distention.  Genitourinary: Positive for genital sores. Negative for dysuria and hematuria.       Blister on perineal area  Neurological: Negative for dizziness and weakness.  Psychiatric/Behavioral: Negative for dysphoric mood.       Objective:   Physical Exam  Constitutional: She appears well-developed and well-nourished. No distress.       Pleasant AA female  HENT:  Head: Normocephalic and atraumatic.       Scarf in place  Neck: Normal range of motion. Neck supple. No thyromegaly present.  Cardiovascular: Normal rate, regular rhythm, normal heart sounds and intact distal pulses.   Pulmonary/Chest: Effort normal and breath sounds normal.  Abdominal: Soft. Bowel sounds are normal. She exhibits no distension. There is no tenderness.  Genitourinary:           No active blisters. Prior croppings well demarcated by pink areas with mostly healed ulcerations.       Assessment:     1. Genital Herpes:  first outbreak per pt report, likely secondary to relatively recent in near no compromise status with chemotherapy and recent upper respiratory infection.    Plan:     As appears to be outbreak is nearly resolved and without open wound the initiation of antiviral therapy with valacyclovir may not be beneficial at this time.   -I have prescribed oral course of valacyclovir for any future breakouts of 500 mg twice a day x3 days  -In addition I have prescribed acyclovir ointment for symptomatic relief and to decrease possible viral shedding on future outbreaks  -Patient is to followup in 3-4 months or sooner if symptoms persist or reoccur

## 2011-06-04 NOTE — Patient Instructions (Addendum)
It was nice to meet both you and your daughter.  I am glad to hear that your chemotherapy went well. Your rash is healing well. We will test a sample for herpes but it is unlikely to show the virus since it is well healed already. If you would like to have a medication (pills or cream), it will be called to your pharmacy.  Return to see me in 3-4 months. If the rash returns, please make an appointment with the clinic.

## 2011-06-08 LAB — HERPES SIMPLEX VIRUS CULTURE: Organism ID, Bacteria: NOT DETECTED

## 2011-06-09 ENCOUNTER — Telehealth: Payer: Self-pay | Admitting: *Deleted

## 2011-06-09 NOTE — Telephone Encounter (Signed)
Pt called with concern of dizziness when she gets up.  She gets up slowly, but gets lightheaaded when she walks. She feels that the lite's are real bright.  Onset last Thursday, these feelings are not getting better, but getting worse.   She does improve after up for awhile, but dizziness returns each time she gets up.   Pt drinks a lot of water, even through the night. Her BP runs 98/?? - 120/74 Last seen on 3/14 with return appointment in 3 months.

## 2011-06-10 NOTE — Telephone Encounter (Signed)
She may be volume depleted.  Continue good oral hydration.  If she continues to feel lightheaded, she needs to come in for blood work so we can check her electrolytes and CBC and orthostatic vitals.

## 2011-06-10 NOTE — Telephone Encounter (Signed)
Pt will see how she does today and will come in Friday if not better.

## 2011-06-11 ENCOUNTER — Other Ambulatory Visit: Payer: Self-pay | Admitting: Oncology

## 2011-06-11 ENCOUNTER — Ambulatory Visit (INDEPENDENT_AMBULATORY_CARE_PROVIDER_SITE_OTHER): Payer: Medicare Other | Admitting: Adult Health

## 2011-06-11 ENCOUNTER — Encounter: Payer: Self-pay | Admitting: Adult Health

## 2011-06-11 ENCOUNTER — Telehealth: Payer: Self-pay | Admitting: Internal Medicine

## 2011-06-11 VITALS — BP 122/74 | HR 75 | Temp 97.0°F | Ht 68.0 in | Wt 238.8 lb

## 2011-06-11 DIAGNOSIS — J45991 Cough variant asthma: Secondary | ICD-10-CM

## 2011-06-11 NOTE — Progress Notes (Signed)
  Subjective:    Patient ID: [name], female    DOB: 01-13-1951, 61 y.o.   MRN: [id number]  HPI  60/F, never smoker, referred for FU of recurrent cough & wheezing Attributed to cough variant asthma , sinuses & GERD She reports wheezing when lying down especially in spring & fall for the last 3 yrs when she was given a diagnosis of asthma. She takes advair & claims compliance during this season. Dyspnea is present on climbing stairs.  She is on chemotherapy for Breast cancer (Dr Humphrey Rolls) . Grade 1 diastolic dysfunction was noted by Dr Haroldine Laws on echo. She reports pedal edema & has been taking lasix daily. She has been on lisinopril for about 2 yrs.  CXR 1/12 no infiltrates, RT portocath  Reviewed spirometry 9/09 wnl & today -no obstruction. Methacholine challenge 9/09 drop in smaller airways >> benicar instead of lisinopril, added singulair   02/26/2011  No better  c/o wheezing worse at night. no cough. 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 Cough - clear mucus Coreg was stopped NO breakthrough reflux on nexium  No PND, orthopnea, pedal edema >>prednisone taper    06/11/2011 Follow up  Pt returns for 1 month follow up asthma - reports symptoms have resolved.  She says that she is feeling well without any cough, wheezing, or shortness of breath. She has rare use of her rescue inhaler. She is currently on Advair 250/50 twice daily CXR last ov with no acute process.   Review of Systems Constitutional:   No  weight loss, night sweats,  Fevers, chills, +  fatigue, or  lassitude.  HEENT:   No headaches,  Difficulty swallowing,  Tooth/dental problems, or  Sore throat,                No sneezing, itching, ear ache, nasal congestion, post nasal drip,   CV:  No chest pain,  Orthopnea, PND, swelling in lower extremities, anasarca, dizziness, palpitations, syncope.   GI  No heartburn, indigestion, abdominal pain, nausea, vomiting, diarrhea, change in bowel habits, loss of appetite, bloody stools.   Resp:  No coughing up of blood.    No chest wall deformity  Skin: no rash or lesions.  GU: no dysuria, change in color of urine, no urgency or frequency.  No flank pain, no hematuria   MS:  No joint pain or swelling.  No decreased range of motion.  No back pain.  Psych:  No change in mood or affect. No depression or anxiety.  No memory loss.          Objective:   Physical Exam   Gen. Pleasant, well-nourished, in no distress ENT - no lesions, no post nasal drip Neck: No JVD, no thyromegaly, no carotid bruits Lungs: no use of accessory muscles, no dullness to percussion, clear without rales or rhonchi  Cardiovascular: Rhythm regular, heart sounds  normal, no murmurs or gallops, no peripheral edema Musculoskeletal: No deformities, no cyanosis or clubbing         Assessment & Plan:

## 2011-06-11 NOTE — Telephone Encounter (Signed)
Will update pt on negative Herpes Virus results.

## 2011-06-11 NOTE — Patient Instructions (Signed)
Continue on current regimen  follow up Dr. Elsworth Soho  In 2 months and As needed

## 2011-06-11 NOTE — Assessment & Plan Note (Signed)
Recent flare now resolved  Cont on current regimen   

## 2011-06-12 ENCOUNTER — Ambulatory Visit: Payer: Medicare Other | Admitting: Internal Medicine

## 2011-06-15 ENCOUNTER — Ambulatory Visit (HOSPITAL_COMMUNITY): Payer: Medicare Other

## 2011-06-15 ENCOUNTER — Telehealth: Payer: Self-pay | Admitting: *Deleted

## 2011-06-15 NOTE — Telephone Encounter (Signed)
per patient request moved appointment for 07-14-2011 starting at 9:30am patient confirmed over the phone

## 2011-06-29 ENCOUNTER — Other Ambulatory Visit: Payer: Self-pay | Admitting: Internal Medicine

## 2011-06-29 ENCOUNTER — Ambulatory Visit (INDEPENDENT_AMBULATORY_CARE_PROVIDER_SITE_OTHER): Payer: Medicare Other | Admitting: Internal Medicine

## 2011-06-29 ENCOUNTER — Encounter: Payer: Self-pay | Admitting: Internal Medicine

## 2011-06-29 VITALS — BP 122/67 | HR 90 | Temp 97.8°F | Ht 68.0 in | Wt 235.6 lb

## 2011-06-29 DIAGNOSIS — H539 Unspecified visual disturbance: Secondary | ICD-10-CM

## 2011-06-29 DIAGNOSIS — I10 Essential (primary) hypertension: Secondary | ICD-10-CM

## 2011-06-29 NOTE — Progress Notes (Signed)
Left message on home phone recording - [facility] 07/01/11 4PM - to arrive at x-ray 3PM for stat labs. No prep. Dr Stanford Scotland placed orders for lab. Hilda Blades Gaynelle Pastrana RN 06/29/11 4:30PM

## 2011-06-29 NOTE — Progress Notes (Signed)
Patient ID: [name], female   DOB: 05/30/1950, 61 y.o.   MRN: [id number] HPI:    1. Visual changes "described as bright flashes in right eye" to the point that "it blinds" the patient. She denies any dizziness, HA, focal weakness or speech deficits.  Recently completed chemotherapy. Review of Systems: Negative except per history of present illness  Physical Exam:  Nursing notes and vitals reviewed General:  alert, well-developed, and cooperative to examination.   Lungs:  normal respiratory effort, no accessory muscle use, normal breath sounds, no crackles, and no wheezes. Heart:  normal rate, regular rhythm, no murmurs, no gallop, and no rub.   Abdomen:  soft, non-tender, normal bowel sounds, no distention, no guarding, no rebound tenderness, no hepatomegaly, and no splenomegaly.   Extremities:  No cyanosis, clubbing, edema Neurologic:  alert & oriented X3, nonfocal exam  Meds: Medications Prior to Admission  Medication Sig Dispense Refill  . acyclovir ointment (ZOVIRAX) 5 % Apply topically every 3 (three) hours.  30 g  6  . albuterol (PROVENTIL) (2.5 MG/3ML) 0.083% nebulizer solution Take 2.5 mg by nebulization 2 (two) times daily as needed. For wheezing      . albuterol (PROVENTIL,VENTOLIN) 90 MCG/ACT inhaler Inhale 2 puffs into the lungs every 4 (four) hours as needed. For wheezing      . carvedilol (COREG) 3.125 MG tablet Take 3.125 mg by mouth 2 (two) times daily with a meal.      . esomeprazole (NEXIUM) 40 MG capsule Take 40 mg by mouth daily before breakfast.        . ferrous sulfate 325 (65 FE) MG tablet Take 325 mg by mouth 3 (three) times daily with meals.       . Fluticasone-Salmeterol (ADVAIR) 250-50 MCG/DOSE AEPB Inhale 1 puff into the lungs 2 (two) times daily.        . furosemide (LASIX) 40 MG tablet Take 40 mg by mouth daily.        Marland Kitchen KLOR-CON M20 20 MEQ tablet Take 1 tablet by mouth daily.      Marland Kitchen letrozole (FEMARA) 2.5 MG tablet Take 1 tablet by mouth Daily.        Marland Kitchen LORazepam (ATIVAN) 1 MG tablet Take 1 mg by mouth daily as needed. For anxiety       . Multiple Vitamin (MULTIVITAMIN) tablet Take 1 tablet by mouth daily.       Marland Kitchen NEXIUM 40 MG capsule TAKE ONE CAPSULE BY MOUTH EVERY DAY  30 capsule  2  . olmesartan (BENICAR) 40 MG tablet Take 40 mg by mouth daily.        . promethazine-phenylephrine (PROMETHAZINE-PHENYLEPHRINE) 6.25-5 MG/5ML SYRP Take 5 mLs by mouth every 4 (four) hours as needed.  300 mL  1  . spironolactone (ALDACTONE) 25 MG tablet Take 25 mg by mouth daily.        . traMADol (ULTRAM) 50 MG tablet Take 50 mg by mouth every 6 (six) hours as needed. For pain       . valACYclovir (VALTREX) 500 MG tablet Take 1 tablet (500 mg total) by mouth 2 (two) times daily. For 3 days  6 tablet  6  . warfarin (COUMADIN) 10 MG tablet Take 5 mg by mouth daily.         Medications Prior to Admission  Medication Dose Route Frequency Provider Last Rate Last Dose  . 0.9 %  sodium chloride infusion   Intravenous Once Deatra Robinson, MD      .  acetaminophen (TYLENOL) tablet 650 mg  650 mg Oral Once Deatra Robinson, MD      . diphenhydrAMINE (BENADRYL) capsule 50 mg  50 mg Oral Once Deatra Robinson, MD      . sodium chloride 0.9 % injection 10 mL  10 mL Intravenous PRN Deatra Robinson, MD   10 mL at 01/26/11 1035    Allergies: Atorvastatin and Rosuvastatin Past Medical History  Diagnosis Date  . Depression     seasonal melancholia  . Hyperlipidemia   . Hypertension   . Arthritis     DJD, lumbar spondylosis  . Myopathy     not statin related, cause unknown   . Breast cancer   . Back pain   . Edema extremities   . Chemotherapy follow-up examination 03/06/2011  . Cough 03/06/2011   Past Surgical History  Procedure Date  . Abdominal hysterectomy     for fibroids   . Laminectomy     C5-6 Dr. Louanne Skye 2005   . Oophorectomy   . Mastectomy     bilateral    Family History  Problem Relation Age of Onset  . Cancer Mother 18    Breast cancer, mother  .  Heart disease Mother 39    CAD  . Breast cancer Mother   . Cancer Father 77    colon cancer  . Colon cancer Father   . Coronary artery disease Other    History   Social History  . Marital Status: Single    Spouse Name: N/A    Number of Children: N/A  . Years of Education: N/A   Occupational History  . Not on file.   Social History Main Topics  . Smoking status: Never Smoker   . Smokeless tobacco: Never Used  . Alcohol Use: No  . Drug Use: No  . Sexually Active: Not on file   Other Topics Concern  . Not on file   Social History Narrative   Single, 2 kidsNever smokedNo alcohol useNo drugsLaid off from [employer] after working there for 14 years.     A/P: 1. Visual changes -concerning given Hx of Breast adenocarcinoma -recently completed her chemotherapy -MRI of brain with CM to eval for mets vs TIA/CVA vs side-effects of chemotherapy -recent eye exam by ophthalmologist did not reveal any abnormalities. -will f/u sp MRI

## 2011-06-29 NOTE — Patient Instructions (Addendum)
Our clinic will contact you with the information on an MRI of brain. Please, take all your medications as prescribed. Please, follow up in 3 months with Dr. Silverio Decamp (your PCP) and call with any questions.

## 2011-06-30 ENCOUNTER — Telehealth: Payer: Self-pay | Admitting: *Deleted

## 2011-06-30 ENCOUNTER — Emergency Department (HOSPITAL_COMMUNITY)
Admission: EM | Admit: 2011-06-30 | Discharge: 2011-06-30 | Disposition: A | Discharge status: Home / Self Care | Payer: Medicare Other | Attending: Emergency Medicine | Admitting: Emergency Medicine

## 2011-06-30 ENCOUNTER — Emergency Department (HOSPITAL_COMMUNITY): Payer: Medicare Other

## 2011-06-30 ENCOUNTER — Encounter (HOSPITAL_COMMUNITY): Payer: Self-pay | Admitting: Emergency Medicine

## 2011-06-30 DIAGNOSIS — R42 Dizziness and giddiness: Secondary | ICD-10-CM | POA: Insufficient documentation

## 2011-06-30 DIAGNOSIS — R55 Syncope and collapse: Secondary | ICD-10-CM | POA: Insufficient documentation

## 2011-06-30 DIAGNOSIS — M129 Arthropathy, unspecified: Secondary | ICD-10-CM | POA: Insufficient documentation

## 2011-06-30 DIAGNOSIS — H53149 Visual discomfort, unspecified: Secondary | ICD-10-CM | POA: Insufficient documentation

## 2011-06-30 DIAGNOSIS — F329 Major depressive disorder, single episode, unspecified: Secondary | ICD-10-CM | POA: Insufficient documentation

## 2011-06-30 DIAGNOSIS — E785 Hyperlipidemia, unspecified: Secondary | ICD-10-CM | POA: Insufficient documentation

## 2011-06-30 DIAGNOSIS — Z79899 Other long term (current) drug therapy: Secondary | ICD-10-CM | POA: Insufficient documentation

## 2011-06-30 DIAGNOSIS — Z853 Personal history of malignant neoplasm of breast: Secondary | ICD-10-CM | POA: Insufficient documentation

## 2011-06-30 DIAGNOSIS — F3289 Other specified depressive episodes: Secondary | ICD-10-CM | POA: Insufficient documentation

## 2011-06-30 DIAGNOSIS — H539 Unspecified visual disturbance: Secondary | ICD-10-CM | POA: Insufficient documentation

## 2011-06-30 DIAGNOSIS — I10 Essential (primary) hypertension: Secondary | ICD-10-CM | POA: Insufficient documentation

## 2011-06-30 LAB — URINALYSIS, ROUTINE W REFLEX MICROSCOPIC
Bilirubin Urine: NEGATIVE
Glucose, UA: NEGATIVE mg/dL
Hgb urine dipstick: NEGATIVE
Ketones, ur: NEGATIVE mg/dL
Nitrite: NEGATIVE
Protein, ur: NEGATIVE mg/dL
Specific Gravity, Urine: 1.014 (ref 1.005–1.030)
Urobilinogen, UA: 0.2 mg/dL (ref 0.0–1.0)
pH: 5.5 (ref 5.0–8.0)

## 2011-06-30 LAB — COMPREHENSIVE METABOLIC PANEL
ALT: 20 U/L (ref 0–35)
AST: 20 U/L (ref 0–37)
Albumin: 4.1 g/dL (ref 3.5–5.2)
Alkaline Phosphatase: 74 U/L (ref 39–117)
BUN: 41 mg/dL — ABNORMAL HIGH (ref 6–23)
CO2: 25 mEq/L (ref 19–32)
Calcium: 10 mg/dL (ref 8.4–10.5)
Chloride: 106 mEq/L (ref 96–112)
Creatinine, Ser: 2.19 mg/dL — ABNORMAL HIGH (ref 0.50–1.10)
GFR calc Af Amer: 27 mL/min — ABNORMAL LOW (ref >90)
GFR calc non Af Amer: 23 mL/min — ABNORMAL LOW (ref >90)
Glucose, Bld: 91 mg/dL (ref 70–99)
Potassium: 4.9 mEq/L (ref 3.5–5.1)
Sodium: 141 mEq/L (ref 135–145)
Total Bilirubin: 0.3 mg/dL (ref 0.3–1.2)
Total Protein: 7.4 g/dL (ref 6.0–8.3)

## 2011-06-30 LAB — URINE MICROSCOPIC-ADD ON

## 2011-06-30 LAB — DIFFERENTIAL
Basophils Absolute: 0 10*3/uL (ref 0.0–0.1)
Basophils Relative: 1 % (ref 0–1)
Eosinophils Absolute: 0.6 10*3/uL (ref 0.0–0.7)
Eosinophils Relative: 9 % — ABNORMAL HIGH (ref 0–5)
Lymphocytes Relative: 49 % — ABNORMAL HIGH (ref 12–46)
Lymphs Abs: 3.3 10*3/uL (ref 0.7–4.0)
Monocytes Absolute: 0.3 10*3/uL (ref 0.1–1.0)
Monocytes Relative: 4 % (ref 3–12)
Neutro Abs: 2.6 10*3/uL (ref 1.7–7.7)
Neutrophils Relative %: 38 % — ABNORMAL LOW (ref 43–77)

## 2011-06-30 LAB — PROTIME-INR
INR: 2.56 — ABNORMAL HIGH (ref 0.00–1.49)
Prothrombin Time: 27.9 seconds — ABNORMAL HIGH (ref 11.6–15.2)

## 2011-06-30 LAB — CBC
HCT: 31.8 % — ABNORMAL LOW (ref 36.0–46.0)
Hemoglobin: 10.8 g/dL — ABNORMAL LOW (ref 12.0–15.0)
MCH: 29 pg (ref 26.0–34.0)
MCHC: 34 g/dL (ref 30.0–36.0)
MCV: 85.5 fL (ref 78.0–100.0)
Platelets: 194 10*3/uL (ref 150–400)
RBC: 3.72 MIL/uL — ABNORMAL LOW (ref 3.87–5.11)
RDW: 13.9 % (ref 11.5–15.5)
WBC: 6.8 10*3/uL (ref 4.0–10.5)

## 2011-06-30 LAB — GLUCOSE, CAPILLARY: Glucose-Capillary: 81 mg/dL (ref 70–99)

## 2011-06-30 MED ORDER — SODIUM CHLORIDE 0.9 % IV SOLN: INTRAVENOUS | Status: DC

## 2011-06-30 NOTE — Discharge Instructions (Signed)
Near-Syncope Near-syncope is sudden weakness, dizziness, or feeling like you might pass out (faint). This may occur when getting up after sitting or while standing for a long period of time. Near-syncope can be caused by a drop in blood pressure. This is a common reaction, but it may occur to a greater degree in people taking medicines to control their blood pressure. Fainting often occurs when the blood pressure or pulse is too low to provide enough blood flow to the brain to keep you conscious. Fainting and near-syncope are not usually due to serious medical problems. However, certain people should be more cautious in the event of near-syncope, including elderly patients, patients with diabetes, and patients with a history of heart conditions (especially irregular rhythms).  CAUSES   Drop in blood pressure.   Physical pain.   Dehydration.   Heat exhaustion.   Emotional distress.   Low blood sugar.   Internal bleeding.   Heart and circulatory problems.   Infections.  SYMPTOMS   Dizziness.   Feeling sick to your stomach (nauseous).   Nearly fainting.   Body numbness.   Turning pale.   Tunnel vision.   Weakness.  HOME CARE INSTRUCTIONS   Lie down right away if you start feeling like you might faint. Breathe deeply and steadily. Wait until all the symptoms have passed. Most of these episodes last only a few minutes. You may feel tired for several hours.   Drink enough fluids to keep your urine clear or pale yellow.   If you are taking blood pressure or heart medicine, get up slowly, taking several minutes to sit and then stand. This can reduce dizziness that is caused by a drop in blood pressure.  SEEK IMMEDIATE MEDICAL CARE IF:   You have a severe headache.   Unusual pain develops in the chest, abdomen, or back.   There is bleeding from the mouth or rectum, or you have black or tarry stool.   An irregular heartbeat or a very rapid pulse develops.   You have  repeated fainting or seizure-like jerking during an episode.   You faint when sitting or lying down.   You develop confusion.   You have difficulty walking.   Severe weakness develops.   Vision problems develop.  MAKE SURE YOU:   Understand these instructions.   Will watch your condition.   Will get help right away if you are not doing well or get worse.  Document Released: 03/09/2005 Document Revised: 02/26/2011 Document Reviewed: 04/25/2010 [employer] Patient Information 2012 [employer]. 

## 2011-06-30 NOTE — ED Provider Notes (Signed)
History     CSN: [id number]  Arrival date & time 06/30/11  1341   First MD Initiated Contact with Patient 06/30/11 1701      Chief Complaint  Patient presents with  . Dizziness    x 2 weeks  . Photophobia    (Consider location/radiation/quality/duration/timing/severity/associated sxs/prior treatment) HPI Comments: History of breast cancer. Has been off treatment for approximately the past 2-3 months. Presents with intermittent vision changes and light sensitivity over the past 2 weeks. She also has associated lightheadedness. The lightheadedness is not positional. She has no associated nausea or vomiting. She denies chest pain, shortness of breath, headache. She has no unsteadiness with gait. The vision changes are made worse with light. She describes floaters which resolve after a period of several seconds to minutes. Seen by primary care physician 2 days ago who recommended MRI which scheduled for tomorrow however the symptoms persisted. She discussed with her oncologist who instructed her to seek evaluation the emergency department. She has no weakness or numbness or tingling.  Patient of the internal medicine clinic  The history is provided by the patient. No language interpreter was used.    Past Medical History  Diagnosis Date  . Depression     seasonal melancholia  . Hyperlipidemia   . Hypertension   . Myopathy     not statin related, cause unknown   . Back pain   . Edema extremities   . Chemotherapy follow-up examination 03/06/2011  . Cough 03/06/2011  . Breast cancer   . Arthritis     DJD, lumbar spondylosis    Past Surgical History  Procedure Date  . Abdominal hysterectomy     for fibroids   . Laminectomy     C5-6 Dr. Louanne Skye 2005   . Oophorectomy   . Mastectomy     bilateral     Family History  Problem Relation Age of Onset  . Cancer Mother 89    Breast cancer, mother  . Heart disease Mother 100    CAD  . Breast cancer Mother   . Cancer Father 72   colon cancer  . Colon cancer Father   . Coronary artery disease Other     History  Substance Use Topics  . Smoking status: Never Smoker   . Smokeless tobacco: Never Used  . Alcohol Use: No    OB History    Grav Para Term Preterm Abortions TAB SAB Ect Mult Living                  Review of Systems  Constitutional: Negative for fever, activity change, appetite change and fatigue.  HENT: Negative for congestion, sore throat, rhinorrhea, neck pain and neck stiffness.   Eyes: Positive for photophobia and visual disturbance. Negative for pain.  Respiratory: Negative for cough and shortness of breath.   Cardiovascular: Negative for chest pain and palpitations.  Gastrointestinal: Negative for nausea, vomiting and abdominal pain.  Genitourinary: Negative for dysuria, urgency, frequency and flank pain.  Musculoskeletal: Negative for myalgias, back pain and arthralgias.  Neurological: Positive for light-headedness. Negative for dizziness, facial asymmetry, weakness, numbness and headaches.  All other systems reviewed and are negative.    Allergies  Atorvastatin and Rosuvastatin  Home Medications   Current Outpatient Rx  Name Route Sig Dispense Refill  . ALBUTEROL SULFATE (2.5 MG/3ML) 0.083% IN NEBU Nebulization Take 2.5 mg by nebulization 2 (two) times daily as needed. For wheezing    . ALBUTEROL 90 MCG/ACT IN AERS Inhalation Inhale  2 puffs into the lungs every 4 (four) hours as needed. For wheezing    . CARVEDILOL 3.125 MG PO TABS Oral Take 3.125 mg by mouth 2 (two) times daily with a meal.    . ESOMEPRAZOLE MAGNESIUM 40 MG PO CPDR Oral Take 40 mg by mouth daily before breakfast.      . FERROUS SULFATE 325 (65 FE) MG PO TABS Oral Take 325 mg by mouth 3 (three) times daily with meals.     Marland Kitchen FLUTICASONE-SALMETEROL 250-50 MCG/DOSE IN AEPB Inhalation Inhale 1 puff into the lungs 2 (two) times daily.      . FUROSEMIDE 40 MG PO TABS Oral Take 40 mg by mouth daily.      Marland Kitchen KLOR-CON M20 20  MEQ PO TBCR Oral Take 1 tablet by mouth daily.    Marland Kitchen LETROZOLE 2.5 MG PO TABS Oral Take 1 tablet by mouth Daily.    Marland Kitchen LORAZEPAM 1 MG PO TABS Oral Take 1 mg by mouth daily as needed. For anxiety     . ONE-DAILY MULTI VITAMINS PO TABS Oral Take 1 tablet by mouth daily.     Marland Kitchen OLMESARTAN MEDOXOMIL 40 MG PO TABS Oral Take 40 mg by mouth daily.      Marland Kitchen SPIRONOLACTONE 25 MG PO TABS Oral Take 25 mg by mouth daily.      . TRAMADOL HCL 50 MG PO TABS Oral Take 50 mg by mouth every 6 (six) hours as needed. For pain     . WARFARIN SODIUM 10 MG PO TABS Oral Take 5 mg by mouth daily.        BP 100/75  Pulse 80  Temp(Src) 98.7 F (37.1 C) (Oral)  Resp 20  SpO2 96%  LMP 12/31/1968  Physical Exam  Nursing note and vitals reviewed. Constitutional: She is oriented to person, place, and time. She appears well-developed and well-nourished. No distress.  HENT:  Head: Normocephalic and atraumatic.  Mouth/Throat: Oropharynx is clear and moist.  Eyes: Conjunctivae and EOM are normal. Pupils are equal, round, and reactive to light.       Visual acuity normal  Neck: Normal range of motion. Neck supple.  Cardiovascular: Normal rate, regular rhythm, normal heart sounds and intact distal pulses.  Exam reveals no gallop and no friction rub.   No murmur heard. Pulmonary/Chest: Effort normal and breath sounds normal. No respiratory distress. She exhibits no tenderness.  Abdominal: Soft. Bowel sounds are normal. There is no tenderness.  Musculoskeletal: Normal range of motion. She exhibits no edema and no tenderness.  Neurological: She is alert and oriented to person, place, and time. She has normal strength and normal reflexes. No cranial nerve deficit or sensory deficit. Coordination and gait normal.  Skin: Skin is dry. No rash noted.    ED Course  Procedures (including critical care time)  Labs Reviewed  CBC - Abnormal; Notable for the following:    RBC 3.72 (*)    Hemoglobin 10.8 (*)    HCT 31.8 (*)     All other components within normal limits  DIFFERENTIAL - Abnormal; Notable for the following:    Neutrophils Relative 38 (*)    Lymphocytes Relative 49 (*)    Eosinophils Relative 9 (*)    All other components within normal limits  COMPREHENSIVE METABOLIC PANEL - Abnormal; Notable for the following:    BUN 41 (*)    Creatinine, Ser 2.19 (*)    GFR calc non Af Amer 23 (*)    GFR calc  Af Amer 27 (*)    All other components within normal limits  PROTIME-INR - Abnormal; Notable for the following:    Prothrombin Time 27.9 (*)    INR 2.56 (*)    All other components within normal limits  GLUCOSE, CAPILLARY  URINALYSIS, ROUTINE W REFLEX MICROSCOPIC   Mr Brain Wo Contrast  06/30/2011  *RADIOLOGY REPORT*  Clinical Data: Dizziness.  Breast cancer.  MRI HEAD WITHOUT CONTRAST  Technique:  Multiplanar, multiecho pulse sequences of the brain and surrounding structures were obtained according to standard protocol without intravenous contrast.  Comparison: CT 03/15/2011  Findings: Intravenous gadolinium not given due to renal insufficiency.  Negative for acute infarct.  Benign cyst in the left basal ganglia measuring 10 x 12 mm.  This is unchanged from prior studies.  No white matter lesions are present.  No mass or edema is present. Negative for hemorrhage.  Mild chronic sinusitis.  IMPRESSION: No acute abnormality.  Original Report Authenticated By: Truett Perna, M.D.     1. Near syncope       MDM  Discussed results with the internal medicine resident on call. They will follow the patient up. I explained that she canceled her MRI tomorrow because it was inconvenient in order to calm today for an MRI. MRI was negative. She'll be discharged home with instructions to followup as scheduled. She received some IV fluids while in emergency department. No concern about posterior circulation as a cause of her symptoms. Her symptoms are near syncope rather than vertiginous.        Trisha Mangle,  MD 06/30/11 [phone]

## 2011-06-30 NOTE — ED Notes (Signed)
Pt reports dizziness and light sensitivity x 2 weeks. Pt seen by Oncologist who told her to come to ED.

## 2011-06-30 NOTE — Telephone Encounter (Signed)
Pt called with complaints of blurred vision, lightheaded, "sees floaters somethimes" Per MD - Instructed pt to go to ED for further evaluation. Pt reported BP at home low. Pt curretently taking benicar 40mg  daily, lasix 40mg  daily. " I told my doctor about it and they said to call you all"   06/29/11 : 1915   87/54 1748   88/54  04/09  0937 99/56  0939 86/50   Pt verbalized understanding.

## 2011-07-01 ENCOUNTER — Other Ambulatory Visit (HOSPITAL_COMMUNITY): Payer: Medicare Other

## 2011-07-02 ENCOUNTER — Ambulatory Visit (HOSPITAL_COMMUNITY)
Admission: RE | Admit: 2011-07-02 | Discharge: 2011-07-02 | Disposition: A | Discharge status: Home / Self Care | Payer: Medicare Other | Source: Ambulatory Visit | Attending: Adult Health | Admitting: Adult Health

## 2011-07-02 ENCOUNTER — Encounter: Payer: Self-pay | Admitting: Internal Medicine

## 2011-07-02 ENCOUNTER — Ambulatory Visit (INDEPENDENT_AMBULATORY_CARE_PROVIDER_SITE_OTHER): Payer: Medicare Other | Admitting: Internal Medicine

## 2011-07-02 VITALS — BP 130/83 | HR 106 | Temp 97.3°F | Ht 68.0 in | Wt 236.9 lb

## 2011-07-02 DIAGNOSIS — Z79899 Other long term (current) drug therapy: Secondary | ICD-10-CM | POA: Insufficient documentation

## 2011-07-02 DIAGNOSIS — I10 Essential (primary) hypertension: Secondary | ICD-10-CM

## 2011-07-02 DIAGNOSIS — C50919 Malignant neoplasm of unspecified site of unspecified female breast: Secondary | ICD-10-CM | POA: Insufficient documentation

## 2011-07-02 DIAGNOSIS — E785 Hyperlipidemia, unspecified: Secondary | ICD-10-CM | POA: Insufficient documentation

## 2011-07-02 DIAGNOSIS — Z09 Encounter for follow-up examination after completed treatment for conditions other than malignant neoplasm: Secondary | ICD-10-CM

## 2011-07-02 DIAGNOSIS — Z9010 Acquired absence of unspecified breast and nipple: Secondary | ICD-10-CM | POA: Insufficient documentation

## 2011-07-02 NOTE — Patient Instructions (Signed)
Please, stop taking furosemide and potassium. If there is any concern with your blood work, I will call you. Otherwise, please, feel free to contact me if you have any concerns.

## 2011-07-02 NOTE — Progress Notes (Signed)
Patient ID: [name], female   DOB: 06/01/1950, 61 y.o.   MRN: [id number] HPI:    Follow up from ED. Please, refer to ED note. Patient reports feeling better and a decrease in visual disturbance  Described as flashes of bright light. No new concerns. Review of Systems: Negative except per history of present illness  Physical Exam:  Nursing notes and vitals reviewed General:  alert, well-developed, and cooperative to examination.   Lungs:  normal respiratory effort, no accessory muscle use, normal breath sounds, no crackles, and no wheezes. Heart:  normal rate, regular rhythm, no murmurs, no gallop, and no rub.   Abdomen:  soft, non-tender, normal bowel sounds, no distention, no guarding, no rebound tenderness, no hepatomegaly, and no splenomegaly.   Extremities:  No cyanosis, clubbing, edema Neurologic:  alert & oriented X3, nonfocal exam  Meds: Medications Prior to Admission  Medication Sig Dispense Refill  . albuterol (PROVENTIL) (2.5 MG/3ML) 0.083% nebulizer solution Take 2.5 mg by nebulization 2 (two) times daily as needed. For wheezing      . albuterol (PROVENTIL,VENTOLIN) 90 MCG/ACT inhaler Inhale 2 puffs into the lungs every 4 (four) hours as needed. For wheezing      . carvedilol (COREG) 3.125 MG tablet Take 3.125 mg by mouth 2 (two) times daily with a meal.      . esomeprazole (NEXIUM) 40 MG capsule Take 40 mg by mouth daily before breakfast.        . ferrous sulfate 325 (65 FE) MG tablet Take 325 mg by mouth 3 (three) times daily with meals.       . Fluticasone-Salmeterol (ADVAIR) 250-50 MCG/DOSE AEPB Inhale 1 puff into the lungs 2 (two) times daily.        Marland Kitchen letrozole (FEMARA) 2.5 MG tablet Take 1 tablet by mouth Daily.      Marland Kitchen LORazepam (ATIVAN) 1 MG tablet Take 1 mg by mouth daily as needed. For anxiety       . Multiple Vitamin (MULTIVITAMIN) tablet Take 1 tablet by mouth daily.       Marland Kitchen olmesartan (BENICAR) 40 MG tablet Take 40 mg by mouth daily.        Marland Kitchen spironolactone  (ALDACTONE) 25 MG tablet Take 25 mg by mouth daily.        . traMADol (ULTRAM) 50 MG tablet Take 50 mg by mouth every 6 (six) hours as needed. For pain       . warfarin (COUMADIN) 10 MG tablet Take 5 mg by mouth daily.         Medications Prior to Admission  Medication Dose Route Frequency Provider Last Rate Last Dose  . 0.9 %  sodium chloride infusion   Intravenous Once Deatra Robinson, MD      . acetaminophen (TYLENOL) tablet 650 mg  650 mg Oral Once Deatra Robinson, MD      . diphenhydrAMINE (BENADRYL) capsule 50 mg  50 mg Oral Once Deatra Robinson, MD      . sodium chloride 0.9 % injection 10 mL  10 mL Intravenous PRN Deatra Robinson, MD   10 mL at 01/26/11 1035    Allergies: Atorvastatin and Rosuvastatin Past Medical History  Diagnosis Date  . Depression     seasonal melancholia  . Hyperlipidemia   . Hypertension   . Myopathy     not statin related, cause unknown   . Back pain   . Edema extremities   . Chemotherapy follow-up examination 03/06/2011  .  Cough 03/06/2011  . Breast cancer   . Arthritis     DJD, lumbar spondylosis   Past Surgical History  Procedure Date  . Abdominal hysterectomy     for fibroids   . Laminectomy     C5-6 Dr. Louanne Skye 2005   . Oophorectomy   . Mastectomy     bilateral    Family History  Problem Relation Age of Onset  . Cancer Mother 70    Breast cancer, mother  . Heart disease Mother 72    CAD  . Breast cancer Mother   . Cancer Father 53    colon cancer  . Colon cancer Father   . Coronary artery disease Other    History   Social History  . Marital Status: Single    Spouse Name: N/A    Number of Children: N/A  . Years of Education: N/A   Occupational History  . Not on file.   Social History Main Topics  . Smoking status: Never Smoker   . Smokeless tobacco: Never Used  . Alcohol Use: No  . Drug Use: No  . Sexually Active: Not on file   Other Topics Concern  . Not on file   Social History Narrative   Single, 2 kidsNever  smokedNo alcohol useNo drugsLaid off from [employer] after working there for 14 years.     A/P: 1. Follow up from ED for a Near Syncope/visual changes -MRI and CT of head negative for CVA and/or metastatic disease -will d/c furosemide and KCL -repeat B-met today -reassurance -f/u on as needed basis.

## 2011-07-02 NOTE — Progress Notes (Signed)
  Echocardiogram 2D Echocardiogram has been performed.  [name], [name] 07/02/2011, 3:59 PM

## 2011-07-03 LAB — BASIC METABOLIC PANEL
BUN: 44 mg/dL — ABNORMAL HIGH (ref 6–23)
CO2: 24 mEq/L (ref 19–32)
Calcium: 10.1 mg/dL (ref 8.4–10.5)
Chloride: 106 mEq/L (ref 96–112)
Creat: 2.12 mg/dL — ABNORMAL HIGH (ref 0.50–1.10)
Glucose, Bld: 108 mg/dL — ABNORMAL HIGH (ref 70–99)
Potassium: 5.2 mEq/L (ref 3.5–5.3)
Sodium: 140 mEq/L (ref 135–145)

## 2011-07-06 ENCOUNTER — Telehealth (HOSPITAL_COMMUNITY): Payer: Self-pay | Admitting: Adult Health

## 2011-07-07 ENCOUNTER — Other Ambulatory Visit: Payer: Self-pay | Admitting: Internal Medicine

## 2011-07-07 MED ORDER — POTASSIUM CHLORIDE CRYS ER 20 MEQ PO TBCR: 20.0000 meq | EXTENDED_RELEASE_TABLET | Freq: Two times a day (BID) | ORAL | Status: DC

## 2011-07-07 MED ORDER — FUROSEMIDE 40 MG PO TABS: 40.0000 mg | ORAL_TABLET | Freq: Every day | ORAL | Status: DC

## 2011-07-08 NOTE — Telephone Encounter (Signed)
error 

## 2011-07-10 ENCOUNTER — Ambulatory Visit (HOSPITAL_COMMUNITY)
Admission: RE | Admit: 2011-07-10 | Discharge: 2011-07-10 | Disposition: A | Discharge status: Home / Self Care | Payer: Medicare Other | Source: Ambulatory Visit | Attending: Internal Medicine | Admitting: Internal Medicine

## 2011-07-10 ENCOUNTER — Ambulatory Visit: Payer: Medicare Other | Admitting: Oncology

## 2011-07-10 ENCOUNTER — Other Ambulatory Visit: Payer: Medicare Other | Admitting: Lab

## 2011-07-10 VITALS — BP 116/72 | HR 88 | Wt 235.5 lb

## 2011-07-10 DIAGNOSIS — I509 Heart failure, unspecified: Secondary | ICD-10-CM | POA: Insufficient documentation

## 2011-07-10 DIAGNOSIS — C50919 Malignant neoplasm of unspecified site of unspecified female breast: Secondary | ICD-10-CM | POA: Insufficient documentation

## 2011-07-10 DIAGNOSIS — R0989 Other specified symptoms and signs involving the circulatory and respiratory systems: Secondary | ICD-10-CM

## 2011-07-10 DIAGNOSIS — R609 Edema, unspecified: Secondary | ICD-10-CM

## 2011-07-10 MED ORDER — FUROSEMIDE 40 MG PO TABS: 40.0000 mg | ORAL_TABLET | ORAL | Status: DC | PRN

## 2011-07-10 NOTE — Assessment & Plan Note (Signed)
Post chemotherapy ECHO. EF 55-60% lateral S'. Follow up as needed.

## 2011-07-10 NOTE — Assessment & Plan Note (Addendum)
Doing very well from cardiac perspective. I reviewed echo with her personally in clinic. LV function looks good. RV perhaps a little dilated with normal function. Lateral s' is stable since last echo (seems to be a bit down from start of chem but this is difficult to assess clearly due to variable windows). She has completed her chemo. We will get 1 more echo in 2-3 months to ensure stability of LV and RV parameters in case she needs further therapy down the road.

## 2011-07-10 NOTE — Patient Instructions (Addendum)
Follow up in 3 months with an ECHO   Take Lasix 40 mg once or twice a week as needed for lower extremity edema.

## 2011-07-10 NOTE — Assessment & Plan Note (Addendum)
Suspect mostly due to venous insufficiency. Continue support stockings and elevation.  Continue Lasix 40 mg once or twice a week as needed when edema is persistent despite conservative measures. Most recent potassium 5.2 will not add potassium.

## 2011-07-14 ENCOUNTER — Other Ambulatory Visit (HOSPITAL_BASED_OUTPATIENT_CLINIC_OR_DEPARTMENT_OTHER): Payer: Medicare Other | Admitting: Lab

## 2011-07-14 ENCOUNTER — Ambulatory Visit (HOSPITAL_BASED_OUTPATIENT_CLINIC_OR_DEPARTMENT_OTHER): Payer: Medicare Other | Admitting: Family

## 2011-07-14 ENCOUNTER — Encounter: Payer: Self-pay | Admitting: Family

## 2011-07-14 ENCOUNTER — Telehealth: Payer: Self-pay | Admitting: Oncology

## 2011-07-14 VITALS — BP 121/78 | HR 97 | Temp 98.6°F | Ht 68.0 in | Wt 233.9 lb

## 2011-07-14 DIAGNOSIS — D649 Anemia, unspecified: Secondary | ICD-10-CM

## 2011-07-14 DIAGNOSIS — C50919 Malignant neoplasm of unspecified site of unspecified female breast: Secondary | ICD-10-CM

## 2011-07-14 DIAGNOSIS — I82409 Acute embolism and thrombosis of unspecified deep veins of unspecified lower extremity: Secondary | ICD-10-CM

## 2011-07-14 LAB — CBC WITH DIFFERENTIAL/PLATELET
BASO%: 0.7 % (ref 0.0–2.0)
Basophils Absolute: 0 10*3/uL (ref 0.0–0.1)
EOS%: 8.4 % — ABNORMAL HIGH (ref 0.0–7.0)
Eosinophils Absolute: 0.4 10*3/uL (ref 0.0–0.5)
HCT: 31.3 % — ABNORMAL LOW (ref 34.8–46.6)
HGB: 10.6 g/dL — ABNORMAL LOW (ref 11.6–15.9)
LYMPH%: 38.1 % (ref 14.0–49.7)
MCH: 29.8 pg (ref 25.1–34.0)
MCHC: 33.8 g/dL (ref 31.5–36.0)
MCV: 88.1 fL (ref 79.5–101.0)
MONO#: 0.3 10*3/uL (ref 0.1–0.9)
MONO%: 6.6 % (ref 0.0–14.0)
NEUT#: 2.3 10*3/uL (ref 1.5–6.5)
NEUT%: 46.2 % (ref 38.4–76.8)
Platelets: 251 10*3/uL (ref 145–400)
RBC: 3.55 10*6/uL — ABNORMAL LOW (ref 3.70–5.45)
RDW: 14.2 % (ref 11.2–14.5)
WBC: 5 10*3/uL (ref 3.9–10.3)
lymph#: 1.9 10*3/uL (ref 0.9–3.3)
nRBC: 0 % (ref 0–0)

## 2011-07-14 LAB — COMPREHENSIVE METABOLIC PANEL
ALT: 15 U/L (ref 0–35)
AST: 20 U/L (ref 0–37)
Albumin: 4.6 g/dL (ref 3.5–5.2)
Alkaline Phosphatase: 61 U/L (ref 39–117)
BUN: 23 mg/dL (ref 6–23)
CO2: 23 mEq/L (ref 19–32)
Calcium: 9.7 mg/dL (ref 8.4–10.5)
Chloride: 107 mEq/L (ref 96–112)
Creatinine, Ser: 1.68 mg/dL — ABNORMAL HIGH (ref 0.50–1.10)
Glucose, Bld: 92 mg/dL (ref 70–99)
Potassium: 4 mEq/L (ref 3.5–5.3)
Sodium: 141 mEq/L (ref 135–145)
Total Bilirubin: 0.5 mg/dL (ref 0.3–1.2)
Total Protein: 6.7 g/dL (ref 6.0–8.3)

## 2011-07-14 NOTE — Telephone Encounter (Signed)
gve the pt her July 2013 appt calendar along with the bone density appt

## 2011-07-14 NOTE — Progress Notes (Signed)
La[facility]  Name: [name]                  DATE: 07/14/2011 MRN: [id number]                      DOB: 01/19/1951  CC:          Ansel Bong, MD  REFERRING PHYSICIAN: Ansel Bong, MD  DIAGNOSIS: 1. Stage IIA. ER/PR positive HER-2/neu positive invasive ductal carcinoma of the left breast diagnosed October 2011.  2. Status post right CVA.  3. Right internal jugular deep venous thrombosis secondary to Port-A-Cath   PREVIOUS THERAPY:  1. Bilateral mastectomy performed October 2011 Left breast: final pathology revealed infiltrating ductal carcinoma, ER/PR+, HER-2/neu overexpressed. Right breast specimen revealed benign breast tissue.  2. Adjuvant chemotherapy consisting of weekly Taxotere carboplatinum and Herceptin for a total of 6 cycles.  3. Herceptin every 3 weeks with letrozole 2.5 mg daily started May June 2012.  CURRENT THERAPY:  1. Letrozole 2.5 mg daily.  2. Coumadin 5 mg/daily.   INTERIM HISTORY: Feels remarkably well with virtually no side effects from anti-estrogen therapy. Has had long-standing hot flashes, now worse on Letrozole. No arthralgias/myalgias, no vaginal dryness.  Has never had bone density, no longer requires mammograms.   No headache or blurred vision. No cough or shortness of breath. No abdominal pain or new bone pain. Bowel and bladder function are normal. Appetite is good, with adequate fluid intake. Remainder of the 10 point  review of systems is negative.  PHYSICAL EXAM: BP 121/78  Pulse 97  Temp(Src) 98.6 F (37 C) (Oral)  Ht 5\' 8"  (1.727 m)  Wt 233 lb 14.4 oz (106.096 kg)  BMI 35.56 kg/m2  LMP 12/31/1968 General: Well developed, well nourished, in no acute distress.  EENT: No ocular or oral lesions. No stomatitis.  Respiratory: Lungs are clear to auscultation bilaterally with normal respiratory movement and no accessory muscle use. Cardiac: No murmur, rub or tachycardia. Edema, right upper extremity. 2+ pitting, dorsum  right hand. Bilateral lower extremity edema, 3+ pitting.   GI: Abdomen is soft, no palpable hepatosplenomegaly. No fluid wave. No tenderness. Musculoskeletal: No kyphosis, no tenderness over the spine, ribs or hips. Lymph: No cervical, infraclavicular, axillary or inguinal adenopathy. Neuro: No focal neurological deficits. Psych: Alert and oriented X 3, appropriate mood and affect.  Breast exam: Bilateral mastectomy with well-healed remote incisions. Mild skin dimpling at the medial aspect of the incisions bilaterally. No redness of the skin or nodules. No axillary adenopathy bilaterally.   LABORATORY STUDIES:   Results for orders placed in visit on 07/14/11  CBC WITH DIFFERENTIAL      Component Value Range   WBC 5.0  3.9 - 10.3 (10e3/uL)   NEUT# 2.3  1.5 - 6.5 (10e3/uL)   HGB 10.6 (*) 11.6 - 15.9 (g/dL)   HCT 31.3 (*) 34.8 - 46.6 (%)   Platelets 251  145 - 400 (10e3/uL)   MCV 88.1  79.5 - 101.0 (fL)   MCH 29.8  25.1 - 34.0 (pg)   MCHC 33.8  31.5 - 36.0 (g/dL)   RBC 3.55 (*) 3.70 - 5.45 (10e6/uL)   RDW 14.2  11.2 - 14.5 (%)   lymph# 1.9  0.9 - 3.3 (10e3/uL)   MONO# 0.3  0.1 - 0.9 (10e3/uL)   Eosinophils Absolute 0.4  0.0 - 0.5 (10e3/uL)   Basophils Absolute 0.0  0.0 - 0.1 (10e3/uL)   NEUT% 46.2  38.4 - 76.8 (%)   LYMPH% 38.1  14.0 - 49.7 (%)   MONO% 6.6  0.0 - 14.0 (%)   EOS% 8.4 (*) 0.0 - 7.0 (%)   BASO% 0.7  0.0 - 2.0 (%)   nRBC 0  0 - 0 (%)  COMPREHENSIVE METABOLIC PANEL      Component Value Range   Sodium 141  135 - 145 (mEq/L)   Potassium 4.0  3.5 - 5.3 (mEq/L)   Chloride 107  96 - 112 (mEq/L)   CO2 23  19 - 32 (mEq/L)   Glucose, Bld 92  70 - 99 (mg/dL)   BUN 23  6 - 23 (mg/dL)   Creatinine, Ser 1.68 (*) 0.50 - 1.10 (mg/dL)   Total Bilirubin 0.5  0.3 - 1.2 (mg/dL)   Alkaline Phosphatase 61  39 - 117 (U/L)   AST 20  0 - 37 (U/L)   ALT 15  0 - 35 (U/L)   Total Protein 6.7  6.0 - 8.3 (g/dL)   Albumin 4.6  3.5 - 5.2 (g/dL)   Calcium 9.7  8.4 - 10.5 (mg/dL)     IMPRESSION:  61 year old female with:  1. History of left breast infiltrating ductal carcinoma, diagnosed October 2010, Stage IIA. Post bilateral mastectomy with no evidence of recurrence. 2. On Coumadin therapy for DVT at port site. Port has been removed.  3. Mild anemia, persistent after chemotherapy. Asymptomatic.   PLAN:   1. Discontinue Coumadin per Dr. Humphrey Rolls.  2. Bone Density, on antiestrogen therapy. 3. Return in 3 months for appt with Dr. Humphrey Rolls with lab prior.  4. Monitor lab for recovery of blood counts.

## 2011-07-20 ENCOUNTER — Other Ambulatory Visit: Payer: Self-pay | Admitting: Internal Medicine

## 2011-07-20 NOTE — Progress Notes (Signed)
Patient ID: [name], female   DOB: April 02, 1950, 61 y.o.   MRN: [id number] Patient ID: [name], female   DOB: 1950-11-18, 61 y.o.   MRN: [id number]  HPI: [name] is a 61 y/o women with h/o obesity, HTN,asthma and HL. Referred by Dr. Humphrey Rolls for management of cardiac status during Herceptin therapy (started May 2012). Was admitted in April 2012, with CVA manifested by LUE dyscoordination. CVA confirmed by MRI.  Found to have L ductal breast CA. ER/PR +. HER 2 neu was equivocal. s/p bilateral mastectomies in 11/11. Lymph nodes negative.    Echo April 10, 2010 EF 55-60%. Grade 1 diastolic dusfunction. MUGA 2/12 EF 73%  Echo April 9,2012 EF 60-65% lat s' 10.43 (? Artifact) Echo 12/13/10 60-65% poor windows for lat s' peak I see is 8.09 ECHO 02/24/12 EF 55-60% lateral s' 6.5 ECHO 07/02/11 EF 55-60% lateral S' 6.9 RV mildly dilated but normal function  September 2012 we changed HCTZ to lasix for longstanding LE edema (since her daughter was born in 36).   She is here for follow up. Completed Herceptin in April 17, 2011. Feels great. Denies SOB/PND/Orhtopnea. Chronic lower extremity edema. Wears compression stockings.   ECHO reviewed with her in clinic   ROS: All systems negative except as listed in HPI, PMH and Problem List.  Past Medical History  Diagnosis Date  . Depression     seasonal melancholia  . Hyperlipidemia   . Hypertension   . Myopathy     not statin related, cause unknown   . Back pain   . Edema extremities   . Chemotherapy follow-up examination 03/06/2011  . Cough 03/06/2011  . Breast cancer   . Arthritis     DJD, lumbar spondylosis    Current Outpatient Prescriptions  Medication Sig Dispense Refill  . albuterol (PROVENTIL) (2.5 MG/3ML) 0.083% nebulizer solution Take 2.5 mg by nebulization 2 (two) times daily as needed. For wheezing      . albuterol (PROVENTIL,VENTOLIN) 90 MCG/ACT inhaler Inhale 2 puffs into the lungs every 4 (four) hours as needed. For  wheezing      . esomeprazole (NEXIUM) 40 MG capsule Take 40 mg by mouth daily before breakfast.        . ferrous sulfate 325 (65 FE) MG tablet Take 325 mg by mouth 3 (three) times daily with meals.       . Fluticasone-Salmeterol (ADVAIR) 250-50 MCG/DOSE AEPB Inhale 1 puff into the lungs 2 (two) times daily.        Marland Kitchen letrozole (FEMARA) 2.5 MG tablet Take 1 tablet by mouth Daily.      Marland Kitchen LORazepam (ATIVAN) 1 MG tablet Take 1 mg by mouth daily as needed. For anxiety       . Multiple Vitamin (MULTIVITAMIN) tablet Take 1 tablet by mouth daily.       Marland Kitchen olmesartan (BENICAR) 40 MG tablet Take 40 mg by mouth daily.        Marland Kitchen spironolactone (ALDACTONE) 25 MG tablet Take 25 mg by mouth daily.        . traMADol (ULTRAM) 50 MG tablet Take 50 mg by mouth every 6 (six) hours as needed. For pain       . furosemide (LASIX) 40 MG tablet Take 1 tablet (40 mg total) by mouth as needed.  30 tablet  6   No current facility-administered medications for this encounter.   Facility-Administered Medications Ordered in Other Encounters  Medication Dose Route Frequency Provider Last Rate Last Dose  .  0.9 %  sodium chloride infusion   Intravenous Once Deatra Robinson, MD      . acetaminophen (TYLENOL) tablet 650 mg  650 mg Oral Once Deatra Robinson, MD      . diphenhydrAMINE (BENADRYL) capsule 50 mg  50 mg Oral Once Deatra Robinson, MD      . sodium chloride 0.9 % injection 10 mL  10 mL Intravenous PRN Deatra Robinson, MD   10 mL at 01/26/11 1035     PHYSICAL EXAM: Filed Vitals:   07/10/11 0923  BP: 116/72  Pulse: 88   General:  Well appearing. No resp difficulty HEENT: normal Neck: supple. JVP flat. Carotids 2+ bilaterally; no bruits. No lymphadenopathy or thryomegaly appreciated. Cor: PMI normal. Regular rate & rhythm. No rubs, murmurs.  Lungs: clear Abdomen: obese, soft, nontender, nondistended. No hepatosplenomegaly. No bruits or masses. Good bowel sounds. Extremities: no cyanosis, clubbing, rash. trival  pretibial edema. 2+ feet edema Neuro: alert & orientedx3, cranial nerves grossly intact. Moves all 4 extremities w/o difficulty. Affect pleasant.    ASSESSMENT & PLAN:

## 2011-07-22 ENCOUNTER — Telehealth: Payer: Self-pay | Admitting: *Deleted

## 2011-07-22 NOTE — Telephone Encounter (Signed)
She needs to check with Dr. Humphrey Rolls. I actually have not seen her except for when she was in the hospital.

## 2011-07-22 NOTE — Telephone Encounter (Signed)
Pt called to state she has stopped taking coumadin per dr Humphrey Rolls, oncologist.   She was taking ASA 81 mg before she started coumadin but stopped while on coumadin.  Do you want her to restart the ASA?

## 2011-07-23 ENCOUNTER — Ambulatory Visit
Admission: RE | Admit: 2011-07-23 | Discharge: 2011-07-23 | Disposition: A | Discharge status: Home / Self Care | Payer: Medicare Other | Source: Ambulatory Visit | Attending: Family | Admitting: Family

## 2011-07-23 DIAGNOSIS — C50919 Malignant neoplasm of unspecified site of unspecified female breast: Secondary | ICD-10-CM

## 2011-07-23 NOTE — Telephone Encounter (Signed)
Per Onc's last note, Pt was changed from ASA to coumadin when she developed a central line thrombosis. That therapy is completed and warfarin stopped. Pt has h/o CVA and needs to be on an ASA - 81 mg is enough. Please also sch appt with Dr Silverio Decamp first available.

## 2011-07-23 NOTE — Telephone Encounter (Signed)
Pt called and inquired about taking ASA.  Per Dr Zenovia Jarred note, pt was instructed to re-start taking ASA 81mg  and an appt was scheduled w/front office With Dr Silverio Decamp on 08/18/11.

## 2011-07-24 ENCOUNTER — Telehealth: Payer: Self-pay | Admitting: *Deleted

## 2011-07-24 NOTE — Telephone Encounter (Signed)
Per Izora Gala NP, notified pt Bone Density looks great.

## 2011-07-24 NOTE — Telephone Encounter (Signed)
Message copied by Lucile Crater on Fri Jul 24, 2011 10:33 AM ------      Message from: Golden Pop      Created: Fri Jul 24, 2011  8:41 AM       Please call and tell her bone density looks GREAT!!

## 2011-07-30 ENCOUNTER — Encounter (INDEPENDENT_AMBULATORY_CARE_PROVIDER_SITE_OTHER): Payer: Self-pay | Admitting: General Surgery

## 2011-07-30 ENCOUNTER — Ambulatory Visit (INDEPENDENT_AMBULATORY_CARE_PROVIDER_SITE_OTHER): Payer: Medicare Other | Admitting: General Surgery

## 2011-07-30 VITALS — BP 138/86 | HR 70 | Temp 97.2°F | Resp 16 | Wt 236.6 lb

## 2011-07-30 DIAGNOSIS — C50919 Malignant neoplasm of unspecified site of unspecified female breast: Secondary | ICD-10-CM

## 2011-07-30 NOTE — Progress Notes (Signed)
History: Patient returned for follow up status post bilateral mastectomy for T1 C. N0 carcinoma left breast with multicentric disease and DCIS in the same breast. She reports she still has some discomfort along her chest wall but no severe pain. No arm swelling. She has finished her Herceptin and is now on letrazole. No apparent side effects.  Exam: General: Appears well Lymph nodes: No cervical, supraclavicular or axillary nodes palpable Chest wall: Status post bilateral mastectomy with well-healed wounds. No skin or chest wall masses. Extremities: No apparent lymphedema  Assessment and plan: Clinically doing well following her bilateral mastectomy for multicentric left breast cancer. On hormonal therapy. Returns 6 months.

## 2011-07-31 ENCOUNTER — Ambulatory Visit: Payer: Medicare Other | Admitting: Pulmonary Disease

## 2011-08-06 ENCOUNTER — Encounter: Payer: Self-pay | Admitting: Internal Medicine

## 2011-08-06 ENCOUNTER — Ambulatory Visit (INDEPENDENT_AMBULATORY_CARE_PROVIDER_SITE_OTHER): Payer: Medicare Other | Admitting: Internal Medicine

## 2011-08-06 DIAGNOSIS — K649 Unspecified hemorrhoids: Secondary | ICD-10-CM

## 2011-08-06 MED ORDER — HYDROCORTISONE 2.5 % RE CREA: TOPICAL_CREAM | Freq: Two times a day (BID) | RECTAL | Status: AC

## 2011-08-06 MED ORDER — HYDROCORTISONE ACETATE 25 MG RE SUPP: 25.0000 mg | Freq: Two times a day (BID) | RECTAL | Status: DC

## 2011-08-06 NOTE — Patient Instructions (Signed)
You were seen today for your hemorrhoids. We are prescribing two medicines. One is a pill that you use as a suppository up to twice a day. The other one is a cream that you use in your anus as well up to twice a day. They both have the same medicine in them so whichever is easier for you to use is fine. If these do not work or you feel you need something else please feel free to call us back and our number is [phone]. Come back as needed or if you already have an appointment keep that appointment.

## 2011-08-06 NOTE — Progress Notes (Signed)
Subjective:     Patient ID: [name], female   DOB: 03/04/1951, 61 y.o.   MRN: [id number]  HPI The patient is a 61 year old female who presents today for an acute visit for hemorrhoid prolapse. She states she is denying rectal pain or burning at this time. Her bowels are moving appropriately. Denies constipation or diarrhea. Has no problems with straining. She states that they have been seeking out further than usual lately and this is distressing her. She would like to have something to help them recede. Has not tried anything for this in the past. No fevers, chills, chest pain, shortness of breath at home. No refills needed at today's visit. No other symptoms or complaints at today's visit.  Review of Systems  Constitutional: Negative for fever, chills, diaphoresis, activity change, appetite change, fatigue and unexpected weight change.  HENT: Negative.   Respiratory: Negative for cough, chest tightness and shortness of breath.   Cardiovascular: Negative for chest pain, palpitations and leg swelling.  Gastrointestinal: Negative for nausea, vomiting, abdominal pain, diarrhea, constipation, blood in stool, abdominal distention, anal bleeding and rectal pain.       Hemorrhoids  Genitourinary: Negative for dysuria, urgency, frequency, hematuria, flank pain, decreased urine volume, vaginal bleeding, vaginal discharge, enuresis, difficulty urinating, genital sores, vaginal pain, menstrual problem, pelvic pain and dyspareunia.  Musculoskeletal: Negative.   Skin: Negative.   Neurological: Negative.   Hematological: Negative.   Psychiatric/Behavioral: Negative.     Vitals: But pressure: 165/91 Pulse: 76 Temperature: 97.7 Height: 5 feet 8 inches Weight: 236 pounds    Objective:   Physical Exam  Nursing note and vitals reviewed. Constitutional: She is oriented to person, place, and time. She appears well-developed and well-nourished.  HENT:  Head: Normocephalic and atraumatic.  Eyes:  EOM are normal. Pupils are equal, round, and reactive to light.  Cardiovascular: Normal rate.   Pulmonary/Chest: Effort normal and breath sounds normal.  Abdominal: Soft. Bowel sounds are normal.  Musculoskeletal: Normal range of motion.  Neurological: She is alert and oriented to person, place, and time.  Skin: Skin is warm and dry.       Assessment/Plan:   1. Hemorrhoids-patient will be given Anusol cream and suppositories. She would like to try both and see which one is better for her. She is open to trying either. If these hemorrhoids do not resolve she may need referral to surgeon's office for possible injection, bending, hemorrhoidectomy. At this time we'll try conservative therapy as she has not had any therapy for these in the past. Advised her to call if she is not getting the help that she needs from these medications.  2. Disposition-patient can be seen back when necessary with her PCP. She does have visits with oncology and pulmonology in July. She does have visit with Dr. Silverio Decamp later in this month however she indicated that she would like to cancel that as she is just seen today I did advise her that was fine and she can do that at the front desk on her way out. Did give her our number is she needs any refills or has any problems she can come back and see [place] again.

## 2011-08-09 ENCOUNTER — Other Ambulatory Visit: Payer: Self-pay | Admitting: Physician Assistant

## 2011-08-12 ENCOUNTER — Other Ambulatory Visit: Payer: Self-pay | Admitting: *Deleted

## 2011-08-12 MED ORDER — SPIRONOLACTONE 25 MG PO TABS: 25.0000 mg | ORAL_TABLET | Freq: Every day | ORAL | Status: DC

## 2011-08-13 ENCOUNTER — Other Ambulatory Visit: Payer: Self-pay | Admitting: Physician Assistant

## 2011-08-13 MED ORDER — ESOMEPRAZOLE MAGNESIUM 40 MG PO CPDR: 40.0000 mg | DELAYED_RELEASE_CAPSULE | Freq: Every day | ORAL | Status: DC

## 2011-08-13 MED ORDER — SPIRONOLACTONE 25 MG PO TABS: 25.0000 mg | ORAL_TABLET | Freq: Every day | ORAL | Status: DC

## 2011-08-13 NOTE — Telephone Encounter (Signed)
Pt called advised she is out of letrozole. Reviewed with MD. VO to refill Letrozole 2.5mg  daily. With yr refills. Medication refill sent electronically.

## 2011-08-13 NOTE — Telephone Encounter (Signed)
Pt is out of meds today.  Can you refill for her?

## 2011-08-13 NOTE — Telephone Encounter (Signed)
1.  Do we refill letrozole or should her oncologist handle this?  Please call over there and see about this.  If we are to refill this, send it back to me. 2.  I will stop her potassium as she is on 2 other drugs that raise potassium. 3.  I refilled the spironolactone and omeprazole.

## 2011-08-13 NOTE — Telephone Encounter (Signed)
Med has bee filled by oncologist.

## 2011-08-18 ENCOUNTER — Encounter: Payer: Medicare Other | Admitting: Internal Medicine

## 2011-08-21 ENCOUNTER — Encounter: Payer: Self-pay | Admitting: Oncology

## 2011-08-21 NOTE — Progress Notes (Signed)
Patient came in today about a letter she received from [employer] stating they had received an appeal denial called billing office for patient spoke with Rodman Key patient does not owe anything patient at this time has a zero balance.

## 2011-08-24 ENCOUNTER — Encounter: Payer: Self-pay | Admitting: Oncology

## 2011-08-26 NOTE — Progress Notes (Signed)
Addended by: Orson Gear on: 08/26/2011 10:25 AM   Modules accepted: Orders

## 2011-09-02 ENCOUNTER — Emergency Department (HOSPITAL_COMMUNITY): Payer: Medicare Other

## 2011-09-02 ENCOUNTER — Emergency Department (HOSPITAL_COMMUNITY)
Admission: EM | Admit: 2011-09-02 | Discharge: 2011-09-02 | Disposition: A | Discharge status: Home / Self Care | Payer: Medicare Other | Attending: Emergency Medicine | Admitting: Emergency Medicine

## 2011-09-02 ENCOUNTER — Encounter (HOSPITAL_COMMUNITY): Payer: Self-pay | Admitting: *Deleted

## 2011-09-02 DIAGNOSIS — Z79899 Other long term (current) drug therapy: Secondary | ICD-10-CM | POA: Insufficient documentation

## 2011-09-02 DIAGNOSIS — Z8739 Personal history of other diseases of the musculoskeletal system and connective tissue: Secondary | ICD-10-CM | POA: Insufficient documentation

## 2011-09-02 DIAGNOSIS — R079 Chest pain, unspecified: Secondary | ICD-10-CM | POA: Insufficient documentation

## 2011-09-02 DIAGNOSIS — Z853 Personal history of malignant neoplasm of breast: Secondary | ICD-10-CM | POA: Insufficient documentation

## 2011-09-02 DIAGNOSIS — E785 Hyperlipidemia, unspecified: Secondary | ICD-10-CM | POA: Insufficient documentation

## 2011-09-02 DIAGNOSIS — I10 Essential (primary) hypertension: Secondary | ICD-10-CM

## 2011-09-02 LAB — CBC
HCT: 34.6 % — ABNORMAL LOW (ref 36.0–46.0)
Hemoglobin: 11.6 g/dL — ABNORMAL LOW (ref 12.0–15.0)
MCH: 28.4 pg (ref 26.0–34.0)
MCHC: 33.5 g/dL (ref 30.0–36.0)
MCV: 84.6 fL (ref 78.0–100.0)
Platelets: 233 10*3/uL (ref 150–400)
RBC: 4.09 MIL/uL (ref 3.87–5.11)
RDW: 12.5 % (ref 11.5–15.5)
WBC: 7.2 10*3/uL (ref 4.0–10.5)

## 2011-09-02 LAB — POCT I-STAT TROPONIN I: Troponin i, poc: 0.03 ng/mL (ref 0.00–0.08)

## 2011-09-02 LAB — POCT I-STAT, CHEM 8
BUN: 12 mg/dL (ref 6–23)
Calcium, Ion: 1.25 mmol/L (ref 1.12–1.32)
Chloride: 106 mEq/L (ref 96–112)
Creatinine, Ser: 1.1 mg/dL (ref 0.50–1.10)
Glucose, Bld: 100 mg/dL — ABNORMAL HIGH (ref 70–99)
HCT: 35 % — ABNORMAL LOW (ref 36.0–46.0)
Hemoglobin: 11.9 g/dL — ABNORMAL LOW (ref 12.0–15.0)
Potassium: 3.4 mEq/L — ABNORMAL LOW (ref 3.5–5.1)
Sodium: 145 mEq/L (ref 135–145)
TCO2: 26 mmol/L (ref 0–100)

## 2011-09-02 LAB — DIFFERENTIAL
Basophils Absolute: 0 10*3/uL (ref 0.0–0.1)
Basophils Relative: 1 % (ref 0–1)
Eosinophils Absolute: 0.5 10*3/uL (ref 0.0–0.7)
Eosinophils Relative: 7 % — ABNORMAL HIGH (ref 0–5)
Lymphocytes Relative: 46 % (ref 12–46)
Lymphs Abs: 3.3 10*3/uL (ref 0.7–4.0)
Monocytes Absolute: 0.4 10*3/uL (ref 0.1–1.0)
Monocytes Relative: 5 % (ref 3–12)
Neutro Abs: 3 10*3/uL (ref 1.7–7.7)
Neutrophils Relative %: 41 % — ABNORMAL LOW (ref 43–77)

## 2011-09-02 NOTE — Discharge Instructions (Signed)
Arterial Hypertension Arterial hypertension (high blood pressure) is a condition of elevated pressure in your blood vessels. Hypertension over a long period of time is a risk factor for strokes, heart attacks, and heart failure. It is also the leading cause of kidney (renal) failure.  CAUSES   In Adults -- Over 90% of all hypertension has no known cause. This is called essential or primary hypertension. In the other 10% of people with hypertension, the increase in blood pressure is caused by another disorder. This is called secondary hypertension. Important causes of secondary hypertension are:   Heavy alcohol use.   Obstructive sleep apnea.   Hyperaldosterosim (Conn's syndrome).   Steroid use.   Chronic kidney failure.   Hyperparathyroidism.   Medications.   Renal artery stenosis.   Pheochromocytoma.   Cushing's disease.   Coarctation of the aorta.   Scleroderma renal crisis.   Licorice (in excessive amounts).   Drugs (cocaine, methamphetamine).  Your caregiver can explain any items above that apply to you.  In Children -- Secondary hypertension is more common and should always be considered.   Pregnancy -- Few women of childbearing age have high blood pressure. However, up to 10% of them develop hypertension of pregnancy. Generally, this will not harm the woman. It may be a sign of 3 complications of pregnancy: preeclampsia, HELLP syndrome, and eclampsia. Follow up and control with medication is necessary.  SYMPTOMS   This condition normally does not produce any noticeable symptoms. It is usually found during a routine exam.   Malignant hypertension is a late problem of high blood pressure. It may have the following symptoms:   Headaches.   Blurred vision.   End-organ damage (this means your kidneys, heart, lungs, and other organs are being damaged).   Stressful situations can increase the blood pressure. If a person with normal blood pressure has their blood  pressure go up while being seen by their caregiver, this is often termed "white coat hypertension." Its importance is not known. It may be related with eventually developing hypertension or complications of hypertension.   Hypertension is often confused with mental tension, stress, and anxiety.  DIAGNOSIS  The diagnosis is made by 3 separate blood pressure measurements. They are taken at least 1 week apart from each other. If there is organ damage from hypertension, the diagnosis may be made without repeat measurements. Hypertension is usually identified by having blood pressure readings:  Above 140/90 mmHg measured in both arms, at 3 separate times, over a couple weeks.   Over 130/80 mmHg should be considered a risk factor and may require treatment in patients with diabetes.  Blood pressure readings over 120/80 mmHg are called "pre-hypertension" even in non-diabetic patients. To get a true blood pressure measurement, use the following guidelines. Be aware of the factors that can alter blood pressure readings.  Take measurements at least 1 hour after caffeine.   Take measurements 30 minutes after smoking and without any stress. This is another reason to quit smoking - it raises your blood pressure.   Use a proper cuff size. Ask your caregiver if you are not sure about your cuff size.   Most home blood pressure cuffs are automatic. They will measure systolic and diastolic pressures. The systolic pressure is the pressure reading at the start of sounds. Diastolic pressure is the pressure at which the sounds disappear. If you are elderly, measure pressures in multiple postures. Try sitting, lying or standing.   Sit at rest for a minimum of   5 minutes before taking measurements.   You should not be on any medications like decongestants. These are found in many cold medications.   Record your blood pressure readings and review them with your caregiver.  If you have hypertension:  Your caregiver  may do tests to be sure you do not have secondary hypertension (see "causes" above).   Your caregiver may also look for signs of metabolic syndrome. This is also called Syndrome X or Insulin Resistance Syndrome. You may have this syndrome if you have type 2 diabetes, abdominal obesity, and abnormal blood lipids in addition to hypertension.   Your caregiver will take your medical and family history and perform a physical exam.   Diagnostic tests may include blood tests (for glucose, cholesterol, potassium, and kidney function), a urinalysis, or an EKG. Other tests may also be necessary depending on your condition.  PREVENTION  There are important lifestyle issues that you can adopt to reduce your chance of developing hypertension:  Maintain a normal weight.   Limit the amount of salt (sodium) in your diet.   Exercise often.   Limit alcohol intake.   Get enough potassium in your diet. Discuss specific advice with your caregiver.   Follow a DASH diet (dietary approaches to stop hypertension). This diet is rich in fruits, vegetables, and low-fat dairy products, and avoids certain fats.  PROGNOSIS  Essential hypertension cannot be cured. Lifestyle changes and medical treatment can lower blood pressure and reduce complications. The prognosis of secondary hypertension depends on the underlying cause. Many people whose hypertension is controlled with medicine or lifestyle changes can live a normal, healthy life.  RISKS AND COMPLICATIONS  While high blood pressure alone is not an illness, it often requires treatment due to its short- and long-term effects on many organs. Hypertension increases your risk for:  CVAs or strokes (cerebrovascular accident).   Heart failure due to chronically high blood pressure (hypertensive cardiomyopathy).   Heart attack (myocardial infarction).   Damage to the retina (hypertensive retinopathy).   Kidney failure (hypertensive nephropathy).  Your caregiver can  explain list items above that apply to you. Treatment of hypertension can significantly reduce the risk of complications. TREATMENT   For overweight patients, weight loss and regular exercise are recommended. Physical fitness lowers blood pressure.   Mild hypertension is usually treated with diet and exercise. A diet rich in fruits and vegetables, fat-free dairy products, and foods low in fat and salt (sodium) can help lower blood pressure. Decreasing salt intake decreases blood pressure in a 1/3 of people.   Stop smoking if you are a smoker.  The steps above are highly effective in reducing blood pressure. While these actions are easy to suggest, they are difficult to achieve. Most patients with moderate or severe hypertension end up requiring medications to bring their blood pressure down to a normal level. There are several classes of medications for treatment. Blood pressure pills (antihypertensives) will lower blood pressure by their different actions. Lowering the blood pressure by 10 mmHg may decrease the risk of complications by as much as 25%. The goal of treatment is effective blood pressure control. This will reduce your risk for complications. Your caregiver will help you determine the best treatment for you according to your lifestyle. What is excellent treatment for one person, may not be for you. HOME CARE INSTRUCTIONS   Do not smoke.   Follow the lifestyle changes outlined in the "Prevention" section.   If you are on medications, follow the directions   carefully. Blood pressure medications must be taken as prescribed. Skipping doses reduces their benefit. It also puts you at risk for problems.   Follow up with your caregiver, as directed.   If you are asked to monitor your blood pressure at home, follow the guidelines in the "Diagnosis" section above.  SEEK MEDICAL CARE IF:   You think you are having medication side effects.   You have recurrent headaches or lightheadedness.     You have swelling in your ankles.   You have trouble with your vision.  SEEK IMMEDIATE MEDICAL CARE IF:   You have sudden onset of chest pain or pressure, difficulty breathing, or other symptoms of a heart attack.   You have a severe headache.   You have symptoms of a stroke (such as sudden weakness, difficulty speaking, difficulty walking).  MAKE SURE YOU:   Understand these instructions.   Will watch your condition.   Will get help right away if you are not doing well or get worse.  Document Released: 03/09/2005 Document Revised: 02/26/2011 Document Reviewed: 10/07/2006 [employer] Patient Information 2012 [employer]. 

## 2011-09-02 NOTE — ED Notes (Addendum)
C/o CP, pinpoints to L chest, describes as "feels funny, some pressure", some radiation to L arm, has felt similar pain in the past", h/o double mastectomy, not sure if r/t this or to past portacath placment, "sometimes swallowing seems different", sx have lessened and calmed down". Feels some L axillary sensation with some movements. Pt alert, NAD, calm, interactive, skin W&D, resps e/u, speaking in clear complete sentences.

## 2011-09-02 NOTE — ED Provider Notes (Signed)
History     CSN: [id number]  Arrival date & time 09/02/11  1913   First MD Initiated Contact with Patient 09/02/11 2037      Chief Complaint  Patient presents with  . Chest Pain    (Consider location/radiation/quality/duration/timing/severity/associated sxs/prior treatment) Patient is a 61 y.o. female presenting with chest pain.  Chest Pain Pertinent negatives for primary symptoms include no shortness of breath, no abdominal pain, no nausea and no vomiting.  Pertinent negatives for associated symptoms include no numbness and no weakness.    patient is complaining of some left-sided chest pain. She states that there is pain it goes from her axilla to both her chest and arm. She states it is worse with lifting heavy things. It is not worse with exertion. She states she feels much better after taking the tramadol. She has no known cardiac history. She's had breast cancer over the last 2 years has had mastectomy and just for chemotherapy and treatment in January. Her lump is on the left side. She's pain-free now. No nausea. No vomiting. No dyspnea. She also states that her prosthetic breasts hurt too.  Past Medical History  Diagnosis Date  . Depression     seasonal melancholia  . Hyperlipidemia   . Hypertension   . Myopathy     not statin related, cause unknown   . Back pain   . Edema extremities   . Chemotherapy follow-up examination 03/06/2011  . Cough 03/06/2011  . Breast cancer   . Arthritis     DJD, lumbar spondylosis    Past Surgical History  Procedure Date  . Abdominal hysterectomy     for fibroids   . Laminectomy     C5-6 Dr. Louanne Skye 2005   . Oophorectomy   . Mastectomy     bilateral   . Breast surgery 2011    Bilateral mastectomy    Family History  Problem Relation Age of Onset  . Cancer Mother 71    Breast cancer, mother  . Heart disease Mother 83    CAD  . Breast cancer Mother   . Cancer Father 63    colon cancer  . Colon cancer Father   . Coronary  artery disease Other     History  Substance Use Topics  . Smoking status: Never Smoker   . Smokeless tobacco: Never Used  . Alcohol Use: No    OB History    Grav Para Term Preterm Abortions TAB SAB Ect Mult Living                  Review of Systems  Constitutional: Negative for activity change and appetite change.  HENT: Negative for neck stiffness.   Eyes: Negative for pain.  Respiratory: Negative for chest tightness and shortness of breath.   Cardiovascular: Positive for chest pain. Negative for leg swelling.  Gastrointestinal: Negative for nausea, vomiting, abdominal pain and diarrhea.  Genitourinary: Negative for flank pain.  Musculoskeletal: Negative for back pain.  Skin: Negative for rash.  Neurological: Negative for weakness, numbness and headaches.  Psychiatric/Behavioral: Negative for behavioral problems.    Allergies  Atorvastatin and Rosuvastatin  Home Medications   Current Outpatient Rx  Name Route Sig Dispense Refill  . ALBUTEROL SULFATE (2.5 MG/3ML) 0.083% IN NEBU Nebulization Take 2.5 mg by nebulization 2 (two) times daily as needed. For wheezing    . ALBUTEROL 90 MCG/ACT IN AERS Inhalation Inhale 2 puffs into the lungs every 4 (four) hours as needed. For wheezing    .  ESOMEPRAZOLE MAGNESIUM 40 MG PO CPDR Oral Take 1 capsule (40 mg total) by mouth daily before breakfast. 30 capsule 6  . FERROUS SULFATE 325 (65 FE) MG PO TABS Oral Take 325 mg by mouth 3 (three) times daily with meals.     Marland Kitchen FLUTICASONE-SALMETEROL 250-50 MCG/DOSE IN AEPB Inhalation Inhale 1 puff into the lungs 2 (two) times daily.      Marland Kitchen HYDROCORTISONE ACETATE 25 MG RE SUPP Rectal Place 1 suppository (25 mg total) rectally every 12 (twelve) hours. 12 suppository 1  . LETROZOLE 2.5 MG PO TABS Oral Take 2.5 mg by mouth daily.    Marland Kitchen LORAZEPAM 1 MG PO TABS Oral Take 1 mg by mouth daily as needed. For anxiety     . ONE-DAILY MULTI VITAMINS PO TABS Oral Take 1 tablet by mouth daily.     Marland Kitchen  OLMESARTAN MEDOXOMIL 40 MG PO TABS Oral Take 40 mg by mouth daily.      Marland Kitchen SPIRONOLACTONE 25 MG PO TABS Oral Take 1 tablet (25 mg total) by mouth daily. 30 tablet 2    NEEDS TO SCHEDULE AN OFFICE VISIT  . TRAMADOL HCL 50 MG PO TABS Oral Take 50 mg by mouth every 6 (six) hours as needed. For pain       BP 156/89  Pulse 75  Temp 98.7 F (37.1 C) (Oral)  Resp 16  SpO2 98%  LMP 12/31/1968  Physical Exam  Nursing note and vitals reviewed. Constitutional: She is oriented to person, place, and time. She appears well-developed and well-nourished.  HENT:  Head: Normocephalic and atraumatic.  Eyes: EOM are normal. Pupils are equal, round, and reactive to light.  Neck: Normal range of motion. Neck supple.  Cardiovascular: Normal rate, regular rhythm and normal heart sounds.   No murmur heard. Pulmonary/Chest: Effort normal and breath sounds normal. No respiratory distress. She has no wheezes. She has no rales. She exhibits tenderness.       Mild tenderness left anterior chest. No rash. Patient is post mastectomy.  Abdominal: Soft. Bowel sounds are normal. She exhibits no distension. There is no tenderness. There is no rebound and no guarding.  Musculoskeletal: Normal range of motion.  Neurological: She is alert and oriented to person, place, and time. No cranial nerve deficit.  Skin: Skin is warm and dry.  Psychiatric: She has a normal mood and affect. Her speech is normal.    ED Course  Procedures (including critical care time)  Labs Reviewed  CBC - Abnormal; Notable for the following:    Hemoglobin 11.6 (*)     HCT 34.6 (*)     All other components within normal limits  DIFFERENTIAL - Abnormal; Notable for the following:    Neutrophils Relative 41 (*)     Eosinophils Relative 7 (*)     All other components within normal limits  POCT I-STAT, CHEM 8 - Abnormal; Notable for the following:    Potassium 3.4 (*)     Glucose, Bld 100 (*)     Hemoglobin 11.9 (*)     HCT 35.0 (*)      All other components within normal limits  POCT I-STAT TROPONIN I   Dg Chest 2 View  09/02/2011  *RADIOLOGY REPORT*  Clinical Data: Chest pain.  CHEST - 2 VIEW  Comparison: PA and lateral chest 05/09/2011.  Findings: Lungs are clear.  Heart size is normal.  No pneumothorax or effusion.  IMPRESSION: Negative chest.  Original Report Authenticated By: Arvid Right. Luther Parody, M.D.  1. Chest pain   2. Hypertension      Date: 09/02/2011  Rate: 89  Rhythm: normal sinus rhythm  QRS Axis: normal  Intervals: normal  ST/T Wave abnormalities: normal  Conduction Disutrbances:none  Narrative Interpretation:   Old EKG Reviewed: unchanged    MDM  Chest pain. Left-sided worse with movement. EKG is reassuring enzymes are negative. She's not had a cardiac history. I doubt PE. Patient had initial hypertension that improved without treatment. She'll follow with her Dr. for the pain and for hypertension.        Jasper Riling. Alvino Chapel, MD 09/02/11 2157

## 2011-09-09 ENCOUNTER — Ambulatory Visit (INDEPENDENT_AMBULATORY_CARE_PROVIDER_SITE_OTHER): Payer: Medicare Other | Admitting: Internal Medicine

## 2011-09-09 ENCOUNTER — Encounter: Payer: Self-pay | Admitting: Internal Medicine

## 2011-09-09 VITALS — BP 168/100 | HR 81 | Temp 97.9°F | Ht 68.0 in | Wt 233.8 lb

## 2011-09-09 DIAGNOSIS — E785 Hyperlipidemia, unspecified: Secondary | ICD-10-CM

## 2011-09-09 DIAGNOSIS — E7800 Pure hypercholesterolemia, unspecified: Secondary | ICD-10-CM

## 2011-09-09 DIAGNOSIS — I10 Essential (primary) hypertension: Secondary | ICD-10-CM

## 2011-09-09 LAB — BASIC METABOLIC PANEL WITH GFR
BUN: 14 mg/dL (ref 6–23)
CO2: 28 mEq/L (ref 19–32)
Calcium: 9.8 mg/dL (ref 8.4–10.5)
Chloride: 105 mEq/L (ref 96–112)
Creat: 1.14 mg/dL — ABNORMAL HIGH (ref 0.50–1.10)
GFR, Est African American: 60 mL/min
GFR, Est Non African American: 52 mL/min — ABNORMAL LOW
Glucose, Bld: 97 mg/dL (ref 70–99)
Potassium: 4.1 mEq/L (ref 3.5–5.3)
Sodium: 142 mEq/L (ref 135–145)

## 2011-09-09 LAB — LIPID PANEL
Cholesterol: 275 mg/dL — ABNORMAL HIGH (ref 0–200)
HDL: 37 mg/dL — ABNORMAL LOW (ref >39)
LDL Cholesterol: 181 mg/dL — ABNORMAL HIGH (ref 0–99)
Total CHOL/HDL Ratio: 7.4 Ratio
Triglycerides: 284 mg/dL — ABNORMAL HIGH (ref <150)
VLDL: 57 mg/dL — ABNORMAL HIGH (ref 0–40)

## 2011-09-09 MED ORDER — AMLODIPINE BESYLATE 10 MG PO TABS: 10.0000 mg | ORAL_TABLET | Freq: Every day | ORAL | Status: DC

## 2011-09-09 NOTE — Assessment & Plan Note (Signed)
As per note by Dr. Haroldine Laws on 04/26/2011, it was noted that patient should be on carvedilol 3.125 twice a day. Patient is currently not taking this. She is on Benicar and spironolactone which is an unusual combination and I believe that given patient has had problems with hypokalemia that this regimen was chosen. I would add Norvasc to the above regimen today given that patient is still hypertensive.  I will check creatinine and potassium and call patient back in one week for repeat blood pressure. Next up would be to go up on the dose of spironolactone. Weight loss and dietary changes stressed today.

## 2011-09-09 NOTE — Progress Notes (Signed)
  Subjective:    Patient ID: [name], female    DOB: 1950/04/14, 61 y.o.   MRN: [id number]  HPI  [name] is a 61 year old female with past medical history most significant for hypertension, hyperlipidemia, left breast cancer on remission, mild renal insufficiency and depression.  She was seen last week in the ER for chest pain and this is a followup visit.  Patient had a workup which included blood tests, cardiac enzymes, EKG and chest x-ray. All results were reviewed today and were without any significant abnormality except mild hypokalemia.  Patient denies any chest pain at this time. She feels that the pain was related to anxiety and she just needs to calm down.  Her blood pressure today is elevated at 168/100. He is on 2 blood pressure medications and is compliant with them.  No other complaints at this time.  Review of Systems  Constitutional: Negative for fever, activity change and appetite change.  HENT: Negative for sore throat.   Respiratory: Negative for cough and shortness of breath.   Cardiovascular: Negative for chest pain and leg swelling.  Gastrointestinal: Negative for nausea, abdominal pain, diarrhea, constipation and abdominal distention.  Genitourinary: Negative for frequency, hematuria and difficulty urinating.  Neurological: Negative for dizziness and headaches.  Psychiatric/Behavioral: Negative for suicidal ideas and behavioral problems.       Objective:   Physical Exam  Constitutional: She is oriented to person, place, and time. She appears well-developed and well-nourished.  HENT:  Head: Normocephalic and atraumatic.  Eyes: Conjunctivae and EOM are normal. Pupils are equal, round, and reactive to light. No scleral icterus.  Neck: Normal range of motion. Neck supple. No JVD present. No thyromegaly present.  Cardiovascular: Normal rate, regular rhythm, normal heart sounds and intact distal pulses.  Exam reveals no gallop and no friction rub.     No murmur heard. Pulmonary/Chest: Effort normal and breath sounds normal. No respiratory distress. She has no wheezes. She has no rales.  Abdominal: Soft. Bowel sounds are normal. She exhibits no distension and no mass. There is no tenderness. There is no rebound and no guarding.  Musculoskeletal: Normal range of motion. She exhibits edema (Trace pedal edema at the ankles). She exhibits no tenderness.  Lymphadenopathy:    She has no cervical adenopathy.  Neurological: She is alert and oriented to person, place, and time.  Psychiatric: She has a normal mood and affect. Her behavior is normal.          Assessment & Plan:

## 2011-09-09 NOTE — Patient Instructions (Signed)

## 2011-09-09 NOTE — Assessment & Plan Note (Signed)
I will check lipid profile today.

## 2011-09-10 ENCOUNTER — Other Ambulatory Visit: Payer: Self-pay | Admitting: Internal Medicine

## 2011-09-10 MED ORDER — SIMVASTATIN 20 MG PO TABS: 20.0000 mg | ORAL_TABLET | Freq: Every evening | ORAL | Status: DC

## 2011-09-10 NOTE — Progress Notes (Signed)
Patient started on Zocor 20 mg daily as her LDL is 181. Patient's goal LDL should be less than 100. The prescription was called in and patient was informed over the phone to pick up the prescription.

## 2011-09-13 ENCOUNTER — Other Ambulatory Visit: Payer: Self-pay | Admitting: Oncology

## 2011-09-14 ENCOUNTER — Other Ambulatory Visit (HOSPITAL_COMMUNITY): Payer: Self-pay | Admitting: *Deleted

## 2011-09-14 MED ORDER — SPIRONOLACTONE 25 MG PO TABS: 25.0000 mg | ORAL_TABLET | Freq: Every day | ORAL | Status: DC

## 2011-09-16 ENCOUNTER — Ambulatory Visit (INDEPENDENT_AMBULATORY_CARE_PROVIDER_SITE_OTHER): Payer: Medicare Other | Admitting: Internal Medicine

## 2011-09-16 ENCOUNTER — Encounter: Payer: Self-pay | Admitting: Internal Medicine

## 2011-09-16 VITALS — BP 146/82 | HR 77 | Temp 97.2°F | Ht 68.0 in | Wt 232.4 lb

## 2011-09-16 DIAGNOSIS — M25561 Pain in right knee: Secondary | ICD-10-CM

## 2011-09-16 DIAGNOSIS — M25569 Pain in unspecified knee: Secondary | ICD-10-CM

## 2011-09-16 DIAGNOSIS — M25562 Pain in left knee: Secondary | ICD-10-CM

## 2011-09-16 NOTE — Assessment & Plan Note (Addendum)
Patient has arthritis related knee pain in both her knees. Patient was seen in the clinic earlier this month and was prescribed conservative measures which she failed. We injected the right knee today with 2 cc Kenalog and 1 cc lidocaine.  Procedure Note: Knee injection  Indication: Knee  pain Injection after verbal/written consent obtained including a discussion of the risk, benefits and alternatives (including oral agents, physical therapy and doing nothing), the patient wishes to proceed. Complications discussed include but not limited to: pain, infection, steroid flare, fat necrosis, skin discoloration, and injury to blood vessels and/or nerves. The anatomic site was sterilized with chlorprep solution and under sterile conditions the anatomic site was injected with a solution of local anesthetic and 2cc of kenalog. The patient tolerated the procedure without immediate complications. Patient is instructed to call the office if any signs or symptoms of infection occur including significant increase in pain, warmth, redness or drainage at the injection site.

## 2011-09-16 NOTE — Progress Notes (Signed)
  Subjective:    Patient ID: [name], female    DOB: 1951-01-15, 61 y.o.   MRN: [id number]  HPI  [name] is a 61 year old female with past medical history as noted in the chart. She comes in today with same complaint that this knee pains right worse than left. The pain did not improve with conservative measures as discussed at last office visit. Patient wants to get knee injection today.  Review of Systems  All other systems reviewed and are negative.       Objective:   Physical Exam  Musculoskeletal:       Tenderness noted along the joint line. Range of motion was restricted secondary to tenderness. Some swelling around the right knee joint as compared to left knee joint probably secondary to effusion. No crepitus. Anterior Lachman test negative. Tender to test for valgus or varus extension.          Assessment & Plan:

## 2011-09-16 NOTE — Patient Instructions (Signed)
Joint Injection Care After Refer to this sheet in the next few days. These instructions provide you with information on caring for yourself after you have had a joint injection. Your caregiver also may give you more specific instructions. Your treatment has been planned according to current medical practices, but problems sometimes occur. Call your caregiver if you have any problems or questions after your procedure. After any type of joint injection, it is not uncommon to experience:  Soreness, swelling, or bruising around the injection site.   Mild numbness, tingling, or weakness around the injection site caused by the numbing medicine used before or with the injection.  It also is possible to experience the following effects associated with the specific agent after injection:  Iodine-based contrast agents:   Allergic reaction (itching, hives, widespread redness, and swelling beyond the injection site).   Corticosteroids (These effects are rare.):   Allergic reaction.   Increased blood sugar levels (If you have diabetes and you notice that your blood sugar levels have increased, notify your caregiver).   Increased blood pressure levels.   Mood swings.   Hyaluronic acid in the use of viscosupplementation.   Temporary heat or redness.   Temporary rash and itching.   Increased fluid accumulation in the injected joint.  These effects all should resolve within a day after your procedure.  HOME CARE INSTRUCTIONS  Limit yourself to light activity the day of your procedure. Avoid lifting heavy objects, bending, stooping, or twisting.   Take prescription or over-the-counter pain medication as directed by your caregiver.   You may apply ice to your injection site to reduce pain and swelling the day of your procedure. Ice may be applied 3 to 4 times:   Put ice in a plastic bag.   Place a towel between your skin and the bag.   Leave the ice on for no longer than 15 to 20 minutes  each time.  SEEK IMMEDIATE MEDICAL CARE IF:   Pain and swelling get worse rather than better or extend beyond the injection site.   Numbness does not go away.   Blood or fluid continues to leak from the injection site.   You have chest pain.   You have swelling of your face or tongue.   You have trouble breathing or you become dizzy.   You develop a fever, chills, or severe tenderness at the injection site that last longer than 1 day.  MAKE SURE YOU:  Understand these instructions.   Watch your condition.   Get help right away if you are not doing well or if you get worse.  Document Released: 11/20/2010 Document Revised: 02/26/2011 Document Reviewed: 11/20/2010 [facility] Patient Information 2012 [name].

## 2011-09-25 ENCOUNTER — Encounter: Payer: Self-pay | Admitting: Pulmonary Disease

## 2011-09-25 ENCOUNTER — Ambulatory Visit (INDEPENDENT_AMBULATORY_CARE_PROVIDER_SITE_OTHER): Payer: Medicare Other | Admitting: Pulmonary Disease

## 2011-09-25 VITALS — BP 116/70 | HR 70 | Temp 98.2°F | Ht 68.0 in | Wt 229.6 lb

## 2011-09-25 DIAGNOSIS — J45991 Cough variant asthma: Secondary | ICD-10-CM

## 2011-09-25 NOTE — Progress Notes (Signed)
  Subjective:    Patient ID: [name], female    DOB: [birth year], 61 y.o.   MRN: [id number]  HPI 60/F, never smoker,  for FU of recurrent cough & wheezing  Attributed to cough variant asthma , sinuses & GERD  She reports wheezing when lying down especially in spring & fall for the last 3 yrs when she was given a diagnosis of asthma. She takes advair & claims compliance during this season. Dyspnea is present on climbing stairs.  She is s/p chemotherapy for Breast cancer (Dr Humphrey Rolls) . Grade 1 diastolic dysfunction noted by Dr Haroldine Laws on echo. She reports pedal edema & has been taking lasix daily. She was on lisinopril for about 2 yrs - changed to olmesartan CXR 1/12 no infiltrates, RT portocath  Reviewed spirometry 9/09 wnl & 9/12 -no obstruction. Methacholine challenge 9/09 drop in smaller airways >> added singulair   Portocath complicated by RIJ thrombus  Head CT 9/12 showed Ethmoid sinus mucosal thickening and bubbly opacity in the left sphenoid     2/20 /2013  Improved x 2 months after augmentin course but worse again x 2 weeks  Urgent care visits x 2 , ER visit x 1 for recurrent wheezing - given prednisone, z-pak, nebs not helping  >>prednisone taper   09/25/2011 44m FU Pt states her breathing has been great. denies any cough, wheezing, chest tx. Pt states she feels she is doing pretty good Not using neb, She has rare use of her rescue inhaler. She is currently on Advair 250/50 twice daily   codeine syp helped  Coreg was stopped  NO breakthrough reflux on nexium  No PND, orthopnea, pedal edema   Review of Systems Patient denies significant dyspnea,cough, hemoptysis,  chest pain, palpitations, pedal edema, orthopnea, paroxysmal nocturnal dyspnea, lightheadedness, nausea, vomiting, abdominal or  leg pains      Objective:   Physical Exam  Gen. Pleasant, well-nourished, in no distress ENT - no lesions, no post nasal drip Neck: No JVD, no thyromegaly, no carotid  bruits Lungs: no use of accessory muscles, no dullness to percussion, clear without rales or rhonchi  Cardiovascular: Rhythm regular, heart sounds  normal, no murmurs or gallops, no peripheral edema Musculoskeletal: No deformities, no cyanosis or clubbing        Assessment & Plan:

## 2011-09-25 NOTE — Patient Instructions (Addendum)
Stay on advair OK to decrease nexium every other day

## 2011-09-28 NOTE — Assessment & Plan Note (Signed)
PFTs 09/09 with FEF 25-75% decrease by over 25% with methacholine - s/o bronchial hyperreactivity,  and she has diffuse wheezing in the clinic on 11/19 Unclear trigger sinuses/ GERD ACE & coreg stopped  Stay on advair - step down in future OK to decrease nexium every other day

## 2011-09-29 ENCOUNTER — Encounter: Payer: Medicare Other | Admitting: Internal Medicine

## 2011-10-07 ENCOUNTER — Ambulatory Visit (INDEPENDENT_AMBULATORY_CARE_PROVIDER_SITE_OTHER): Payer: Medicare Other | Admitting: Internal Medicine

## 2011-10-07 ENCOUNTER — Encounter: Payer: Self-pay | Admitting: Internal Medicine

## 2011-10-07 VITALS — BP 138/86 | HR 93 | Temp 97.6°F | Ht 68.0 in | Wt 230.8 lb

## 2011-10-07 DIAGNOSIS — Z0000 Encounter for general adult medical examination without abnormal findings: Secondary | ICD-10-CM

## 2011-10-07 DIAGNOSIS — R109 Unspecified abdominal pain: Secondary | ICD-10-CM | POA: Insufficient documentation

## 2011-10-07 DIAGNOSIS — K644 Residual hemorrhoidal skin tags: Secondary | ICD-10-CM

## 2011-10-07 DIAGNOSIS — E785 Hyperlipidemia, unspecified: Secondary | ICD-10-CM

## 2011-10-07 NOTE — Progress Notes (Signed)
  Subjective:    Patient ID: [name], female    DOB: 1950/07/19, 61 y.o.   MRN: [id number]  HPI Comments: 61 y.o significant PMH presents for chronic hemmrrhoids.  She had noted hemmrrhoids for years. She sees a lump around her rectum that comes and goes which has also been there for a while.  The lump will shrink and come back.  She has noticed streaks of blood with wiping only when her hemmrhoids are out of control.  She denies weakness, fatigue but reports intermittent left lower quadrant ab pain.  She has been using anusol suppositories which are helping. She has not started using the anusol cream.  She is also putting vasoline around her anus.  She denies constipation.  She reports periodic itching to rectum and denies much pain assoc with hemmroids.    FH: colon ca-father dx age 39, daughter with ms      Review of Systems  Constitutional: Negative for fever and appetite change.  Respiratory: Negative for shortness of breath.   Cardiovascular: Negative for chest pain.  Gastrointestinal:       +LLQ intermittent ab pain, denies constipation  Genitourinary: Negative for dysuria and frequency.  Neurological: Negative for dizziness, light-headedness and headaches.       Objective:   Physical Exam  Nursing note and vitals reviewed. Constitutional: She is oriented to person, place, and time. Vital signs are normal. She appears well-developed and well-nourished. She is cooperative. No distress.       Very plesant  HENT:  Head: Normocephalic and atraumatic.  Mouth/Throat: Oropharynx is clear and moist and mucous membranes are normal. No oropharyngeal exudate.       Wig on head  Eyes: Conjunctivae are normal. Pupils are equal, round, and reactive to light. No scleral icterus.  Cardiovascular: Normal rate, regular rhythm, S1 normal, S2 normal and normal heart sounds.   No murmur heard. Pulmonary/Chest: Effort normal and breath sounds normal. No respiratory distress. She has no  wheezes.  Abdominal: Soft. Normal appearance and bowel sounds are normal. She exhibits no distension. There is tenderness in the left lower quadrant.       Obese ab Mild TTP LLQ  Genitourinary: Rectal exam shows external hemorrhoid.       Ext hemmrrhoid noted no active bleeding  Musculoskeletal:       1+edema lower ext b/l   Neurological: She is alert and oriented to person, place, and time.  Skin: Skin is warm, dry and intact. No rash noted. She is not diaphoretic.  Psychiatric: She has a normal mood and affect. Her speech is normal and behavior is normal.          Assessment & Plan:

## 2011-10-07 NOTE — Assessment & Plan Note (Signed)
Initially no LLQ ab pain on exam, then re-examination noted LLQ ab pain Pt scheduled for colonoscopy to examine

## 2011-10-07 NOTE — Assessment & Plan Note (Addendum)
Cont Zocor Abnormal lipid panel but pt just started on Zocor after side effects of other statins Reck panel in 6 months to 1 year

## 2011-10-07 NOTE — Patient Instructions (Addendum)
Please make an appt in 6 months to have your lipids checked again, sooner if needed Follow up for colonscopy

## 2011-10-07 NOTE — Assessment & Plan Note (Addendum)
HD stable, no active bleeding Reviewed options for tx-Pt not ready for surgery  Given info about hemmorroids and home tx (i.e creams, suppositories, sitz baths) Will cont use supp, increase fiber and water intake

## 2011-10-07 NOTE — Assessment & Plan Note (Signed)
Pt due for colonoscopy-Made referral today Mammogram due 12/2011

## 2011-10-08 NOTE — Progress Notes (Signed)
agree

## 2011-10-15 ENCOUNTER — Telehealth: Payer: Self-pay | Admitting: Oncology

## 2011-10-15 ENCOUNTER — Other Ambulatory Visit (HOSPITAL_BASED_OUTPATIENT_CLINIC_OR_DEPARTMENT_OTHER): Payer: Medicare Other | Admitting: Lab

## 2011-10-15 ENCOUNTER — Other Ambulatory Visit: Payer: Self-pay | Admitting: Medical Oncology

## 2011-10-15 ENCOUNTER — Encounter: Payer: Self-pay | Admitting: Oncology

## 2011-10-15 ENCOUNTER — Ambulatory Visit (HOSPITAL_BASED_OUTPATIENT_CLINIC_OR_DEPARTMENT_OTHER): Payer: Medicare Other | Admitting: Oncology

## 2011-10-15 VITALS — BP 147/82 | HR 97 | Temp 99.1°F | Ht 68.0 in | Wt 229.4 lb

## 2011-10-15 DIAGNOSIS — E559 Vitamin D deficiency, unspecified: Secondary | ICD-10-CM

## 2011-10-15 DIAGNOSIS — C50919 Malignant neoplasm of unspecified site of unspecified female breast: Secondary | ICD-10-CM

## 2011-10-15 LAB — CBC WITH DIFFERENTIAL/PLATELET
BASO%: 0.8 % (ref 0.0–2.0)
Basophils Absolute: 0.1 10*3/uL (ref 0.0–0.1)
EOS%: 6.4 % (ref 0.0–7.0)
Eosinophils Absolute: 0.5 10*3/uL (ref 0.0–0.5)
HCT: 35.7 % (ref 34.8–46.6)
HGB: 12 g/dL (ref 11.6–15.9)
LYMPH%: 40.3 % (ref 14.0–49.7)
MCH: 28.2 pg (ref 25.1–34.0)
MCHC: 33.7 g/dL (ref 31.5–36.0)
MCV: 83.8 fL (ref 79.5–101.0)
MONO#: 0.4 10*3/uL (ref 0.1–0.9)
MONO%: 5.8 % (ref 0.0–14.0)
NEUT#: 3.3 10*3/uL (ref 1.5–6.5)
NEUT%: 46.7 % (ref 38.4–76.8)
Platelets: 266 10*3/uL (ref 145–400)
RBC: 4.27 10*6/uL (ref 3.70–5.45)
RDW: 13.3 % (ref 11.2–14.5)
WBC: 7 10*3/uL (ref 3.9–10.3)
lymph#: 2.8 10*3/uL (ref 0.9–3.3)

## 2011-10-15 LAB — COMPREHENSIVE METABOLIC PANEL
ALT: 33 U/L (ref 0–35)
AST: 24 U/L (ref 0–37)
Albumin: 4.6 g/dL (ref 3.5–5.2)
Alkaline Phosphatase: 81 U/L (ref 39–117)
BUN: 17 mg/dL (ref 6–23)
CO2: 27 mEq/L (ref 19–32)
Calcium: 10.3 mg/dL (ref 8.4–10.5)
Chloride: 107 mEq/L (ref 96–112)
Creatinine, Ser: 1.23 mg/dL — ABNORMAL HIGH (ref 0.50–1.10)
Glucose, Bld: 109 mg/dL — ABNORMAL HIGH (ref 70–99)
Potassium: 4.4 mEq/L (ref 3.5–5.3)
Sodium: 141 mEq/L (ref 135–145)
Total Bilirubin: 0.4 mg/dL (ref 0.3–1.2)
Total Protein: 7 g/dL (ref 6.0–8.3)

## 2011-10-15 MED ORDER — PROCHLORPERAZINE MALEATE 10 MG PO TABS: 10.0000 mg | ORAL_TABLET | Freq: Four times a day (QID) | ORAL | Status: DC | PRN

## 2011-10-15 NOTE — Patient Instructions (Addendum)
1. Doing well  2. Continue letrozole daily  3. i will see you back in 6 months

## 2011-10-15 NOTE — Progress Notes (Signed)
OFFICE PROGRESS NOTE  CC [name] Seltzer, MD [name], MD  Julius Bowels, MD [place] [id number]  DIAGNOSIS: 61 year old female with  #1 stage II A. ER/PR positive HER-2/neu positive invasive ductal carcinoma of the left breast diagnosed October 2011. Patient is status post bilateral mastectomies.  #2 status post right CVA.  #3 right internal jugular deep venous thrombosis secondary to her Port-A-Cath  #4 status post PICC line placement for ongoing Herceptin therapy.  PRIOR THERAPY:  #1 Status post bilateral mastectomy performed in October 2011  #2 The left breast: final pathology revealed infiltrating ductal carcinoma that was ER positive PR positive HER-2/neu positive. Right breast specimen only revealed benign breast tissue.  #3 patient received adjuvant chemotherapy consisting of weekly Taxotere carboplatinum and Herceptin for a total of 6 cycle.  #4 Patient has completed Herceptin on 04/17/2011.  #5 she has been on letrozole 2.5 mg starting June 2012.  CURRENT THERAPY: Letrozole 2.5 mg daily since June 2012  INTERVAL HISTORY: [name] 61 y.o. female returns for Followup visit today. Overall she looks remarkably well. She tells me that she is tolerating the letrozole quite nicely. She has no myalgias or arthralgias she notices no vaginal discharge she has no headaches double vision blurring of vision she does occasionally feel nausea and we are giving her a prescription for Compazine. She has no fevers chills or night sweats she does occasionally get hot flashes. She has not noticed any swelling in her lower extremities no evidence of recurrent DVTs. No recent hospitalizations. Remainder of the 10 point review of systems is negative.  MEDICAL HISTORY: Past Medical History  Diagnosis Date  . Depression     seasonal melancholia  . Hyperlipidemia   . Hypertension   . Myopathy     not statin related, cause unknown   . Back pain     . Edema extremities   . Chemotherapy follow-up examination 03/06/2011  . Cough 03/06/2011  . Arthritis     DJD, lumbar spondylosis  . Breast cancer     invasive ductal ca in remission as of 10/07/11 s/p double mastectomy    ALLERGIES:  is allergic to atorvastatin and rosuvastatin.  MEDICATIONS:  Current Outpatient Prescriptions  Medication Sig Dispense Refill  . albuterol (PROVENTIL) (2.5 MG/3ML) 0.083% nebulizer solution Take 2.5 mg by nebulization 2 (two) times daily as needed. For wheezing      . albuterol (PROVENTIL,VENTOLIN) 90 MCG/ACT inhaler Inhale 2 puffs into the lungs every 4 (four) hours as needed. For wheezing      . amLODipine (NORVASC) 10 MG tablet Take 1 tablet (10 mg total) by mouth daily.  30 tablet  1  . aspirin 81 MG tablet Take 81 mg by mouth daily.      Marland Kitchen esomeprazole (NEXIUM) 40 MG capsule Take 1 capsule (40 mg total) by mouth daily before breakfast.  30 capsule  6  . ferrous sulfate 325 (65 FE) MG tablet Take 325 mg by mouth 3 (three) times daily with meals.       . Fluticasone-Salmeterol (ADVAIR) 250-50 MCG/DOSE AEPB Inhale 1 puff into the lungs 2 (two) times daily.        . furosemide (LASIX) 40 MG tablet Take 40 mg by mouth as needed.      . hydrocortisone (ANUSOL-HC) 2.5 % rectal cream Place 1 application rectally 2 (two) times daily.      . hydrocortisone (ANUSOL-HC) 25 MG suppository Place 25 mg rectally 2 (two) times daily as  needed.      Marland Kitchen letrozole (FEMARA) 2.5 MG tablet Take 2.5 mg by mouth daily.      Marland Kitchen LORazepam (ATIVAN) 1 MG tablet Take 1 mg by mouth daily as needed. For anxiety       . Multiple Vitamin (MULTIVITAMIN) tablet Take 1 tablet by mouth daily.       Marland Kitchen olmesartan (BENICAR) 40 MG tablet Take 40 mg by mouth daily.        Marland Kitchen spironolactone (ALDACTONE) 25 MG tablet Take 1 tablet (25 mg total) by mouth daily.  30 tablet  12  . traMADol (ULTRAM) 50 MG tablet Take 50 mg by mouth every 6 (six) hours as needed. For pain       . prochlorperazine  (COMPAZINE) 10 MG tablet Take 1 tablet (10 mg total) by mouth every 6 (six) hours as needed.  30 tablet  0  . simvastatin (ZOCOR) 20 MG tablet Take 1 tablet (20 mg total) by mouth every evening.  30 tablet  0   No current facility-administered medications for this visit.   Facility-Administered Medications Ordered in Other Visits  Medication Dose Route Frequency Provider Last Rate Last Dose  . sodium chloride 0.9 % injection 10 mL  10 mL Intravenous PRN Deatra Robinson, MD   10 mL at 01/26/11 1035    SURGICAL HISTORY:  Past Surgical History  Procedure Date  . Abdominal hysterectomy     for fibroids   . Laminectomy     C5-6 Dr. Louanne Skye 2005   . Oophorectomy   . Mastectomy     bilateral   . Breast surgery 2011    Bilateral mastectomy    REVIEW OF SYSTEMS:  Pertinent items are noted in HPI.   PHYSICAL EXAMINATION: General appearance: alert, cooperative and appears stated age Neck: no adenopathy, no carotid bruit, no JVD, supple, symmetrical, trachea midline and thyroid not enlarged, symmetric, no tenderness/mass/nodules Lymph nodes: Cervical, supraclavicular, and axillary nodes normal. Resp: clear to auscultation bilaterally and normal percussion bilaterally Back: symmetric, no curvature. ROM normal. No CVA tenderness. Cardio: regular rate and rhythm, S1, S2 normal, no murmur, click, rub or gallop and normal apical impulse GI: soft, non-tender; bowel sounds normal; no masses,  no organomegaly Extremities: extremities normal, atraumatic, no cyanosis or edema Neurologic: Alert and oriented X 3, normal strength and tone. Normal symmetric reflexes. Normal coordination and gait Mental status: Alert, oriented, thought content appropriate Sensory: normal Motor: grossly normal Reflexes: 2+ and symmetric Anterior chest wall bilateral mastectomy scars are well healed there is no evidence of local recurrence.  ECOG PERFORMANCE STATUS: 0 - Asymptomatic  Blood pressure 147/82, pulse 97,  temperature 99.1 F (37.3 C), temperature source Oral, height 5\' 8"  (1.727 m), weight 229 lb 6.4 oz (104.055 kg), last menstrual period 12/31/1968.  LABORATORY DATA: Lab Results  Component Value Date   WBC 7.0 10/15/2011   HGB 12.0 10/15/2011   HCT 35.7 10/15/2011   MCV 83.8 10/15/2011   PLT 266 10/15/2011      Chemistry      Component Value Date/Time   NA 142 09/09/2011 1054   K 4.1 09/09/2011 1054   CL 105 09/09/2011 1054   CO2 28 09/09/2011 1054   BUN 14 09/09/2011 1054   CREATININE 1.14* 09/09/2011 1054   CREATININE 1.10 09/02/2011 2039      Component Value Date/Time   CALCIUM 9.8 09/09/2011 1054   ALKPHOS 61 07/14/2011 0934   AST 20 07/14/2011 0934   ALT 15 07/14/2011  0934   BILITOT 0.5 07/14/2011 0934       RADIOGRAPHIC STUDIES:  No results found.  ASSESSMENT: 61 year old female with:  1.  stage II a invasive ductal carcinoma of the left breast diagnosed in October 2011. She was found to have a ER/PR positive HER-2/neu positive breast cancer.She underwent bilateral mastectomies. Her right mastectomy was an elective prophylactic mastectomy due to her on comfort. Patient subsequently underwent adjuvant chemotherapy consisting of Taxotere carboplatinum and Herceptin for a total of 6 cycles. Thereafter she received Herceptin every 3 weeks and she has been on Letrozole 2.5 mg daily. Overall she is tolerating it well she has no evidence of recurrent disease.  PLAN:  #1 patient will continue letrozole 2.5 mg daily a total of 5 years of therapy is planned. She knows to call me with any problems.  #2 I will plan on seeing her back in 6 months time in followup.  All questions were answered. The patient knows to call the clinic with any problems, questions or concerns. We can certainly see the patient much sooner if necessary.  I spent 20 minutes counseling the patient face to face. The total time spent in the appointment was 30 minutes.    Marcy Panning, MD Medical/Oncology [facility] [phone] (beeper) [phone] (Office)  10/15/2011, 11:16 AM

## 2011-10-15 NOTE — Telephone Encounter (Signed)
gve the pt her jan 2014 appt calendar °

## 2011-10-16 ENCOUNTER — Other Ambulatory Visit: Payer: Self-pay | Admitting: *Deleted

## 2011-10-16 ENCOUNTER — Ambulatory Visit: Payer: Medicare Other | Admitting: Oncology

## 2011-10-17 MED ORDER — SIMVASTATIN 20 MG PO TABS: 20.0000 mg | ORAL_TABLET | Freq: Every evening | ORAL | Status: DC

## 2011-10-31 ENCOUNTER — Other Ambulatory Visit: Payer: Self-pay | Admitting: Internal Medicine

## 2011-12-07 ENCOUNTER — Other Ambulatory Visit: Payer: Self-pay | Admitting: *Deleted

## 2011-12-07 MED ORDER — AMBULATORY NON FORMULARY MEDICATION: Status: DC

## 2011-12-08 ENCOUNTER — Other Ambulatory Visit: Payer: Self-pay | Admitting: *Deleted

## 2011-12-08 NOTE — Telephone Encounter (Signed)
Pt called , medication called in was too expensive. Per Dr. Humphrey Rolls pt to try biotene and continue to swish/spit with baking soda & water. Pt to schedule appt with PCP and have mouth soreness evaluated. Notified pt, who verbalized understanding. Pt advised she has an appt tomorrow with PCP and will tell them about her mouth soreness.

## 2011-12-09 ENCOUNTER — Ambulatory Visit (INDEPENDENT_AMBULATORY_CARE_PROVIDER_SITE_OTHER): Payer: Medicare Other | Admitting: Internal Medicine

## 2011-12-09 ENCOUNTER — Encounter: Payer: Self-pay | Admitting: Internal Medicine

## 2011-12-09 VITALS — BP 153/91 | HR 99 | Temp 98.3°F | Ht 68.0 in | Wt 235.0 lb

## 2011-12-09 DIAGNOSIS — E785 Hyperlipidemia, unspecified: Secondary | ICD-10-CM

## 2011-12-09 DIAGNOSIS — K1379 Other lesions of oral mucosa: Secondary | ICD-10-CM | POA: Insufficient documentation

## 2011-12-09 DIAGNOSIS — K644 Residual hemorrhoidal skin tags: Secondary | ICD-10-CM

## 2011-12-09 DIAGNOSIS — K1370 Unspecified lesions of oral mucosa: Secondary | ICD-10-CM

## 2011-12-09 MED ORDER — SIMVASTATIN 40 MG PO TABS: 40.0000 mg | ORAL_TABLET | Freq: Every evening | ORAL | Status: DC

## 2011-12-09 NOTE — Assessment & Plan Note (Signed)
Colonoscopy in 10/2011 showed medium hemorrhoids.  She has occasional bleeding when she wipes but has not tried her suppository or cream. -Will start using suppository and anusol cream

## 2011-12-09 NOTE — Progress Notes (Signed)
HPI: Mrs. [name] is a 61 yo W with PMH of left invasive ductal carcinoma: stage II A. ER/PR positive HER-2/neu positive diagnosed October 2011 and received adjuvant chemotherapy Taxotere carboplatinum and Herceptin for a total of 6 cycles and Herceptin was completed on 04/17/2011. She has been on letrozole 2.5 mg starting June 2012 and plan is 5 years of treatment who presents today for mouth sores.  She has been having these in the past 3-4 days. She was given mouthwash Rx but did not fill because of the cost but will get it today because the biotene and baking soda with water did not help (it actually made her feel worst).  She notes that she recently switched toothpaste ([employer] sensitive) and has been using hard toothbrush.  She denies any fever or chills or N/V.  She still has hemorrhoids but has not been taking her suppository or anusol cream.  No other complaints today.  ROS: as per HPI  PE: General: alert, well-developed, and cooperative to examination.  Mouth: pharynx pink and moist, and no exudates. Upper palate: tiny irritated superfical mucosa 1cm in length anteriorly without any purulent drainage. Right lower lip: 1-13mm superficial lesion.   Neck: supple, full ROM, no thyromegaly, no JVD, and no carotid bruits.  Lungs: normal respiratory effort, no accessory muscle use, normal breath sounds, no crackles, and no wheezes. Heart: normal rate, regular rhythm, no murmur, no gallop, and no rub.  Abdomen: soft, non-tender, normal bowel sounds, no distention, no guarding, no rebound tenderness Msk: no joint swelling, no joint warmth, and no redness over joints.  Pulses: 2+ DP/PT pulses bilaterally

## 2011-12-09 NOTE — Assessment & Plan Note (Signed)
This could be 2/2 to her breast cancer treatment or new toothpaste as well as using hard toothbrush.  Physical exam shows very tiny irritated mucosa on the anterior of her hard palate and right lower lip. -Start magic mouthwash as prescribed -Change back to her regular toothpaste and use soft brush

## 2011-12-09 NOTE — Assessment & Plan Note (Signed)
LDL is 181 in 09/2011, her goal is <70 given that she had previous CVA.  She is intolerant of lipitor and crestor and does not want to try them again.  She is only on ZOcor 20mg  at this time. -Will increase ZOcor to 40mg  qhs and I will repeat lipid panel in 3 months when she comes back to see me.

## 2012-01-12 ENCOUNTER — Other Ambulatory Visit: Payer: Self-pay | Admitting: *Deleted

## 2012-01-12 MED ORDER — ESOMEPRAZOLE MAGNESIUM 40 MG PO CPDR: 40.0000 mg | DELAYED_RELEASE_CAPSULE | Freq: Every day | ORAL | Status: DC

## 2012-01-16 ENCOUNTER — Other Ambulatory Visit: Payer: Self-pay | Admitting: Internal Medicine

## 2012-01-20 ENCOUNTER — Encounter: Payer: Self-pay | Admitting: Internal Medicine

## 2012-01-20 ENCOUNTER — Ambulatory Visit (INDEPENDENT_AMBULATORY_CARE_PROVIDER_SITE_OTHER): Payer: Medicare Other | Admitting: Internal Medicine

## 2012-01-20 VITALS — BP 148/84 | HR 88 | Temp 98.4°F | Ht 68.0 in | Wt 233.0 lb

## 2012-01-20 DIAGNOSIS — K1370 Unspecified lesions of oral mucosa: Secondary | ICD-10-CM

## 2012-01-20 DIAGNOSIS — K1379 Other lesions of oral mucosa: Secondary | ICD-10-CM

## 2012-01-20 DIAGNOSIS — K219 Gastro-esophageal reflux disease without esophagitis: Secondary | ICD-10-CM

## 2012-01-20 MED ORDER — FUROSEMIDE 40 MG PO TABS: 40.0000 mg | ORAL_TABLET | ORAL | Status: DC | PRN

## 2012-01-20 MED ORDER — DIPHENHYD-HYDROCORT-NYSTATIN MT SUSP: OROMUCOSAL | Status: DC

## 2012-01-20 MED ORDER — OMEPRAZOLE-SODIUM BICARBONATE 40-1100 MG PO CAPS: 1.0000 | ORAL_CAPSULE | Freq: Every day | ORAL | Status: DC

## 2012-01-20 MED ORDER — FERROUS SULFATE 325 (65 FE) MG PO TABS: 325.0000 mg | ORAL_TABLET | Freq: Three times a day (TID) | ORAL | Status: DC

## 2012-01-20 NOTE — Patient Instructions (Addendum)
We will try the Zegerid for 1-2 weeks to see how it helps your reflux. If the reflux continues, we will like you to contact Dr. Benson Norway, your Gastroenterologist. For your mouth irritation we will try a different Magic Mouthwash formulation that has some hydrocortison which is a steroid, benadryl to help the inflammation, and nystatin to kill any yeast. Follow-up as needed.

## 2012-01-20 NOTE — Progress Notes (Signed)
  Subjective:    Patient ID: [name], female    DOB: 25-Dec-1950, 61 y.o.   MRN: [id number]  HPI  [name] is a 61 year old female with history significant for left invasive ductal carcinoma (stage IIa ER/PR positive HER-2/neu positive diagnosed October 2011 status post adjuvant chemotherapy Taxotere carboplatinum and Herceptin for total of 6 cycles with completion January 2013. She's been on letrozole 2.5 mg since June 2012 with plan for 5 years of treatment. She has had c/o mouth sores of which she was evaluated couple weeks ago and prescribed Magic mouthwash. She reports that this particular Magic Mouthwash which she was prescribed is different from her prior Magic mouthwash, more expensive, and not as effective. She therefore is requesting a different formulation. In addition she states that she's had recurrent reflux associated with a brackish taste in her mouth in spite of daily Nexium therapy. She is otherwise without complaints.   Review of Systems  Constitutional: Negative for fever and fatigue.  HENT: Positive for mouth sores. Negative for sore throat, trouble swallowing, neck pain and voice change.   Respiratory: Negative for shortness of breath.   Cardiovascular: Negative for chest pain and leg swelling.  Neurological: Negative for dizziness.       Objective:   Physical Exam  Constitutional: She is oriented to person, place, and time. She appears well-developed and well-nourished. No distress.       Pleasant AA female  HENT:  Head: Normocephalic and atraumatic.  Mouth/Throat: Oropharynx is clear and moist and mucous membranes are normal. No oral lesions.       Head scarf in place  Eyes: Conjunctivae normal and EOM are normal. Pupils are equal, round, and reactive to light.  Neck: Normal range of motion. Neck supple. No thyromegaly present.  Cardiovascular: Normal rate, regular rhythm and normal heart sounds.   Pulmonary/Chest: Effort normal and breath sounds  normal. She has no wheezes. She exhibits no tenderness.  Abdominal: Soft. Bowel sounds are normal. There is no tenderness.  Lymphadenopathy:    She has no cervical adenopathy.  Neurological: She is alert and oriented to person, place, and time.  Skin: Skin is warm and dry.  Psychiatric: She has a normal mood and affect. Her behavior is normal. Judgment and thought content normal.          Assessment & Plan:  1. Oral ulcers: will prescribe Magic Mouthwash formulation that contains hydrocortisone, diphenhydramine and nystatin   2. GERD: not fully controlled on Nexium, will try 2 week course of Zegerid which has bicarbonate in it and re-assess. Pt may need further evaluation by [name] who did her colonoscopy ie EGD

## 2012-01-21 ENCOUNTER — Other Ambulatory Visit: Payer: Self-pay | Admitting: Internal Medicine

## 2012-01-21 ENCOUNTER — Telehealth: Payer: Self-pay | Admitting: *Deleted

## 2012-01-21 MED ORDER — PANTOPRAZOLE SODIUM 40 MG PO TBEC: 40.0000 mg | DELAYED_RELEASE_TABLET | Freq: Every day | ORAL | Status: DC

## 2012-01-21 MED ORDER — DIPHENHYD-HYDROCORT-NYSTATIN MT SUSP: OROMUCOSAL | Status: DC

## 2012-01-21 MED ORDER — ESOMEPRAZOLE MAGNESIUM 40 MG PO CPDR: 40.0000 mg | DELAYED_RELEASE_CAPSULE | Freq: Two times a day (BID) | ORAL | Status: DC

## 2012-01-21 NOTE — Telephone Encounter (Signed)
I will send in a prescription for pantoprozole which is the generic form of Protonix.  Another option is that she can take the Nexium that she already has twice a day for the next 14 days and see if that helps before getting the Protonix.  Please call pt and inform of these option.  Will send pantoprazole escript today.

## 2012-01-21 NOTE — Telephone Encounter (Signed)
Call from pt. States Zegerid cost $78.00; can u prescribe something less expensive?  Thanks

## 2012-01-21 NOTE — Progress Notes (Signed)
Rx called to [facility]  - for qty of [place].

## 2012-01-21 NOTE — Progress Notes (Signed)
I have resent sent this with adjusted quantity of 100 ml.

## 2012-01-25 ENCOUNTER — Other Ambulatory Visit: Payer: Self-pay | Admitting: *Deleted

## 2012-01-25 NOTE — Telephone Encounter (Signed)
Pt was called called; stated everything had been taken care of.

## 2012-01-26 MED ORDER — LORAZEPAM 1 MG PO TABS: 1.0000 mg | ORAL_TABLET | Freq: Every day | ORAL | Status: DC | PRN

## 2012-01-26 MED ORDER — HYDROCORTISONE ACETATE 25 MG RE SUPP: 25.0000 mg | Freq: Two times a day (BID) | RECTAL | Status: AC | PRN

## 2012-01-26 NOTE — Telephone Encounter (Signed)
Lorazepam rx called to [facility].

## 2012-01-27 ENCOUNTER — Other Ambulatory Visit: Payer: Self-pay | Admitting: Internal Medicine

## 2012-01-27 ENCOUNTER — Ambulatory Visit (INDEPENDENT_AMBULATORY_CARE_PROVIDER_SITE_OTHER): Payer: Medicare Other | Admitting: Adult Health

## 2012-01-27 ENCOUNTER — Encounter: Payer: Self-pay | Admitting: Adult Health

## 2012-01-27 VITALS — BP 122/86 | HR 76 | Temp 97.0°F | Ht 68.0 in | Wt 238.0 lb

## 2012-01-27 DIAGNOSIS — J45991 Cough variant asthma: Secondary | ICD-10-CM

## 2012-01-27 MED ORDER — FLUTICASONE-SALMETEROL 100-50 MCG/DOSE IN AEPB: 1.0000 | INHALATION_SPRAY | Freq: Two times a day (BID) | RESPIRATORY_TRACT | Status: DC

## 2012-01-27 NOTE — Patient Instructions (Addendum)
Decrease Advair 100/50 1 puff Twice daily   follow up Dr. Elsworth Soho  In 4 months

## 2012-01-27 NOTE — Assessment & Plan Note (Signed)
Doing very well ,  Will decrease Advair 100/50  follow up Dr. Elsworth Soho  In 4 months and  As needed

## 2012-01-27 NOTE — Progress Notes (Signed)
  Subjective:    Patient ID: [name], female    DOB: [birth year], 61 y.o.   MRN: [id number]  HPI  61/F, never smoker,  for FU of recurrent cough & wheezing  Attributed to cough variant asthma , sinuses & GERD  She reports wheezing when lying down especially in spring & fall for the last 3 yrs when she was given a diagnosis of asthma. She takes advair & claims compliance during this season. Dyspnea is present on climbing stairs.  She is s/p chemotherapy for Breast cancer (Dr Humphrey Rolls) . Grade 1 diastolic dysfunction noted by Dr Haroldine Laws on echo. She reports pedal edema & has been taking lasix daily. She was on lisinopril for about 2 yrs - changed to olmesartan CXR 1/12 no infiltrates, RT portocath  Reviewed spirometry 9/09 wnl & 9/12 -no obstruction. Methacholine challenge 9/09 drop in smaller airways >> added singulair   Portocath complicated by RIJ thrombus  Head CT 9/12 showed Ethmoid sinus mucosal thickening and bubbly opacity in the left sphenoid     2/20 /2013  Improved x 2 months after augmentin course but worse again x 2 weeks  Urgent care visits x 2 , ER visit x 1 for recurrent wheezing - given prednisone, z-pak, nebs not helping  >>prednisone taper   09/25/11  29m FU Pt states her breathing has been great. denies any cough, wheezing, chest tx. Pt states she feels she is doing pretty good Not using neb, She has rare use of her rescue inhaler. She is currently on Advair 250/50 twice daily   codeine syp helped  Coreg was stopped  NO breakthrough reflux on nexium  No PND, orthopnea, pedal edema >>no changes   01/27/2012 Follow up Asthma  Patient returns for followup for asthma reports has been "amazing" since last ov.   Has changed her Advair to as needed only use. On average uses Advair once weekly Has had no flare in cough, wheezing, shortness of breath That she is Phenergan, codeine cough syrup on occasion with resolution of cough and wheezing She denies any hemoptysis,  orthopnea, PND, or leg swelling    Review of Systems  Patient denies significant dyspnea,cough, hemoptysis,  chest pain, palpitations, pedal edema, orthopnea, paroxysmal nocturnal dyspnea, lightheadedness, nausea, vomiting, abdominal or  leg pains      Objective:   Physical Exam   Gen. Pleasant, well-nourished, in no distress ENT - no lesions, no post nasal drip Neck: No JVD, no thyromegaly, no carotid bruits Lungs: no use of accessory muscles, no dullness to percussion, clear without rales or rhonchi  Cardiovascular: Rhythm regular, heart sounds  normal, no murmurs or gallops, no peripheral edema Musculoskeletal: No deformities, no cyanosis or clubbing        Assessment & Plan:

## 2012-02-09 ENCOUNTER — Telehealth: Payer: Self-pay | Admitting: Medical Oncology

## 2012-02-09 NOTE — Telephone Encounter (Signed)
Per MD patient to be seen by [facility]. Onc-tx sent. Notified patient regarding her concerns and to expect a call from scheduling. Patient voiced understanding. All questions answered.

## 2012-02-09 NOTE — Telephone Encounter (Signed)
Please have patient come see [name]

## 2012-02-09 NOTE — Telephone Encounter (Signed)
Patient called c/o of brief shooting pain to top of head, states it started a week to two and half weeks ago, mostly occurs in morning and very brief; a few seconds at most, and occurs sporadically @ times throughout the day. Rates pain 3-4 on a scale of 10. Patient having difficulty describing pain. Patient states also has a history of migraine headaches since she was 61 y.o. Patients next MD and Lab appt. 04/21/12. Will review with MD.

## 2012-02-10 ENCOUNTER — Telehealth: Payer: Self-pay | Admitting: Medical Oncology

## 2012-02-10 ENCOUNTER — Telehealth: Payer: Self-pay | Admitting: Oncology

## 2012-02-10 ENCOUNTER — Encounter: Payer: Self-pay | Admitting: Adult Health

## 2012-02-10 ENCOUNTER — Ambulatory Visit (HOSPITAL_BASED_OUTPATIENT_CLINIC_OR_DEPARTMENT_OTHER): Payer: Medicare Other | Admitting: Lab

## 2012-02-10 ENCOUNTER — Other Ambulatory Visit: Payer: Medicare Other | Admitting: Lab

## 2012-02-10 ENCOUNTER — Ambulatory Visit (HOSPITAL_BASED_OUTPATIENT_CLINIC_OR_DEPARTMENT_OTHER): Payer: Medicare Other | Admitting: Adult Health

## 2012-02-10 VITALS — BP 165/82 | HR 86 | Temp 98.5°F | Resp 20 | Ht 68.0 in | Wt 236.4 lb

## 2012-02-10 DIAGNOSIS — C50919 Malignant neoplasm of unspecified site of unspecified female breast: Secondary | ICD-10-CM

## 2012-02-10 DIAGNOSIS — R519 Headache, unspecified: Secondary | ICD-10-CM

## 2012-02-10 DIAGNOSIS — Z170 Estrogen receptor positive status [ER+]: Secondary | ICD-10-CM

## 2012-02-10 DIAGNOSIS — R51 Headache: Secondary | ICD-10-CM

## 2012-02-10 LAB — COMPREHENSIVE METABOLIC PANEL (CC13)
ALT: 34 U/L (ref 0–55)
AST: 28 U/L (ref 5–34)
Albumin: 4.4 g/dL (ref 3.5–5.0)
Alkaline Phosphatase: 97 U/L (ref 40–150)
BUN: 16 mg/dL (ref 7.0–26.0)
CO2: 28 mEq/L (ref 22–29)
Calcium: 10.9 mg/dL — ABNORMAL HIGH (ref 8.4–10.4)
Chloride: 110 mEq/L — ABNORMAL HIGH (ref 98–107)
Creatinine: 1.3 mg/dL — ABNORMAL HIGH (ref 0.6–1.1)
Glucose: 94 mg/dl (ref 70–99)
Potassium: 4 mEq/L (ref 3.5–5.1)
Sodium: 145 mEq/L (ref 136–145)
Total Bilirubin: 0.54 mg/dL (ref 0.20–1.20)
Total Protein: 7.3 g/dL (ref 6.4–8.3)

## 2012-02-10 LAB — CBC WITH DIFFERENTIAL/PLATELET
BASO%: 0.9 % (ref 0.0–2.0)
Basophils Absolute: 0.1 10*3/uL (ref 0.0–0.1)
EOS%: 5.3 % (ref 0.0–7.0)
Eosinophils Absolute: 0.4 10*3/uL (ref 0.0–0.5)
HCT: 34.4 % — ABNORMAL LOW (ref 34.8–46.6)
HGB: 12 g/dL (ref 11.6–15.9)
LYMPH%: 46.4 % (ref 14.0–49.7)
MCH: 29.9 pg (ref 25.1–34.0)
MCHC: 34.9 g/dL (ref 31.5–36.0)
MCV: 85.7 fL (ref 79.5–101.0)
MONO#: 0.4 10*3/uL (ref 0.1–0.9)
MONO%: 5.8 % (ref 0.0–14.0)
NEUT#: 3.1 10*3/uL (ref 1.5–6.5)
NEUT%: 41.6 % (ref 38.4–76.8)
Platelets: 234 10*3/uL (ref 145–400)
RBC: 4.01 10*6/uL (ref 3.70–5.45)
RDW: 13.4 % (ref 11.2–14.5)
WBC: 7.4 10*3/uL (ref 3.9–10.3)
lymph#: 3.4 10*3/uL — ABNORMAL HIGH (ref 0.9–3.3)

## 2012-02-10 NOTE — Telephone Encounter (Signed)
S/w the pt and she is aware that she will be contacted with the mri appt by the rad dept

## 2012-02-10 NOTE — Progress Notes (Signed)
OFFICE PROGRESS NOTE  CC [name], MD [name], MD  [place] Bow[place], [place] [id number]  DIAGNOSIS: 61 year old female with  #1 stage II A. ER/PR positive HER-2/neu positive invasive ductal carcinoma of the left breast diagnosed October 2011. Patient is status post bilateral mastectomies.  #2 status post right CVA.  #3 right internal jugular deep venous thrombosis secondary to her Port-A-Cath  #4 status post PICC line placement for ongoing Herceptin therapy.  PRIOR THERAPY:  #1 Status post bilateral mastectomy performed in October 2011  #2 The left breast: final pathology revealed infiltrating ductal carcinoma that was ER positive PR positive HER-2/neu positive. Right breast specimen only revealed benign breast tissue.  #3 patient received adjuvant chemotherapy consisting of weekly Taxotere carboplatinum and Herceptin for a total of 6 cycle.  #4 Patient has completed Herceptin on 04/17/2011.  #5 she has been on letrozole 2.5 mg starting June 2012.  CURRENT THERAPY: Letrozole 2.5 mg daily since June 2012  INTERVAL HISTORY: [name] 61 y.o. female returns today as an add on due to experiencing headaches.  These headaches started two weeks ago as a sharp pain, with no alleviating or aggravating factors.  She does occasionally have nausea with her headaches, but no other associated signs and symptoms.  She does wear a tight scarf on her head often.  She also has hypertension.  Otherwise she does not have fever, chills, neck pain, numbness, weakness, vision changes, dizziness, or any other concerns.   MEDICAL HISTORY: Past Medical History  Diagnosis Date  . Depression     seasonal melancholia  . Hyperlipidemia   . Hypertension   . Myopathy     not statin related, cause unknown   . Back pain   . Edema extremities   . Chemotherapy follow-up examination 03/06/2011  . Cough 03/06/2011  . Arthritis     DJD, lumbar spondylosis    . Breast cancer     invasive ductal ca in remission as of 10/07/11 s/p double mastectomy    ALLERGIES:  is allergic to atorvastatin and rosuvastatin.  MEDICATIONS:  Current Outpatient Prescriptions  Medication Sig Dispense Refill  . albuterol (PROVENTIL) (2.5 MG/3ML) 0.083% nebulizer solution Take 2.5 mg by nebulization 2 (two) times daily as needed. For wheezing      . albuterol (PROVENTIL,VENTOLIN) 90 MCG/ACT inhaler Inhale 2 puffs into the lungs every 4 (four) hours as needed. For wheezing      . amLODipine (NORVASC) 10 MG tablet TAKE 1 TABLET (10 MG TOTAL) BY MOUTH DAILY.  30 tablet  1  . aspirin 81 MG tablet Take 81 mg by mouth daily.      . Diphenhyd-Hydrocort-Nystatin SUSP Swish and spit a capful per mouth as needed  100 mL  3  . ferrous sulfate 325 (65 FE) MG tablet Take 1 tablet (325 mg total) by mouth 3 (three) times daily with meals.  90 tablet  6  . Fluticasone-Salmeterol (ADVAIR DISKUS) 100-50 MCG/DOSE AEPB Inhale 1 puff into the lungs 2 (two) times daily.  14 each  0  . furosemide (LASIX) 40 MG tablet Take 1 tablet (40 mg total) by mouth as needed.  30 tablet  6  . hydrocortisone (ANUSOL-HC) 2.5 % rectal cream Place 1 application rectally 2 (two) times daily.      . hydrocortisone (ANUSOL-HC) 25 MG suppository Place 1 suppository (25 mg total) rectally 2 (two) times daily as needed.  20 suppository  6  . letrozole (FEMARA) 2.5 MG tablet  Take 2.5 mg by mouth daily.      Marland Kitchen LORazepam (ATIVAN) 1 MG tablet Take 1 tablet (1 mg total) by mouth daily as needed. For anxiety  30 tablet  5  . Multiple Vitamin (MULTIVITAMIN) tablet Take 1 tablet by mouth daily.       Marland Kitchen olmesartan (BENICAR) 40 MG tablet Take 40 mg by mouth daily.        . pantoprazole (PROTONIX) 40 MG tablet Take 1 tablet (40 mg total) by mouth daily.  30 tablet  3  . prochlorperazine (COMPAZINE) 10 MG tablet Take 10 mg by mouth every 6 (six) hours as needed.      . promethazine-codeine (PHENERGAN WITH CODEINE) 6.25-10  MG/5ML syrup Take 5 mLs by mouth every 4 (four) hours as needed.      . simvastatin (ZOCOR) 40 MG tablet Take 1 tablet (40 mg total) by mouth every evening.  90 tablet  3  . spironolactone (ALDACTONE) 25 MG tablet Take 1 tablet (25 mg total) by mouth daily.  30 tablet  12  . traMADol (ULTRAM) 50 MG tablet Take 50 mg by mouth every 6 (six) hours as needed. For pain        No current facility-administered medications for this visit.   Facility-Administered Medications Ordered in Other Visits  Medication Dose Route Frequency Provider Last Rate Last Dose  . sodium chloride 0.9 % injection 10 mL  10 mL Intravenous PRN Deatra Robinson, MD   10 mL at 01/26/11 1035    SURGICAL HISTORY:  Past Surgical History  Procedure Date  . Abdominal hysterectomy     for fibroids   . Laminectomy     C5-6 Dr. Louanne Skye 2005   . Oophorectomy   . Mastectomy     bilateral   . Breast surgery 2011    Bilateral mastectomy    REVIEW OF SYSTEMS:  General: fatigue (-), night sweats (-), fever (-), pain (-) Lymph: palpable nodes (-) HEENT: vision changes (-), mucositis (-), gum bleeding (-), epistaxis (-) Cardiovascular: chest pain (-), palpitations (-) Pulmonary: shortness of breath (-), dyspnea on exertion (-), cough (-), hemoptysis (-) GI:  Early satiety (-), melena (-), dysphagia (-), nausea/vomiting (-), diarrhea (-) GU: dysuria (-), hematuria (-), incontinence (-) Musculoskeletal: joint swelling (-), joint pain (-), back pain (-) Neuro: weakness (-), numbness (-), headache (-), confusion (-) Skin: Rash (-), lesions (-), dryness (-) Psych: depression (-), suicidal/homicidal ideation (-), feeling of hopelessness (-)   PHYSICAL EXAMINATION:  BP 165/82  Pulse 86  Temp 98.5 F (36.9 C) (Oral)  Resp 20  Ht 5\' 8"  (1.727 m)  Wt 236 lb 6.4 oz (107.23 kg)  BMI 35.94 kg/m2  LMP 12/31/1968 General: Patient is a well appearing female in no acute distress HEENT: PERRLA, sclerae anicteric no conjunctival pallor,  MMM Neck: supple, no palpable adenopathy Lungs: clear to auscultation bilaterally, no wheezes, rhonchi, or rales Cardiovascular: regular rate rhythm, S1, S2, no murmurs, rubs or gallops Abdomen: Soft, non-tender, non-distended, normoactive bowel sounds, no HSM Extremities: warm and well perfused, no clubbing, cyanosis, or edema Skin: No rashes or lesions Neuro: Non-focal Anterior chest wall bilateral mastectomy scars are well healed there is no evidence of local recurrence. ECOG PERFORMANCE STATUS: 0 - Asymptomatic  LABORATORY DATA: Lab Results  Component Value Date   WBC 7.4 02/10/2012   HGB 12.0 02/10/2012   HCT 34.4* 02/10/2012   MCV 85.7 02/10/2012   PLT 234 02/10/2012      Chemistry  Component Value Date/Time   NA 145 02/10/2012 1343   NA 141 10/15/2011 1021   K 4.0 02/10/2012 1343   K 4.4 10/15/2011 1021   CL 110* 02/10/2012 1343   CL 107 10/15/2011 1021   CO2 28 02/10/2012 1343   CO2 27 10/15/2011 1021   BUN 16.0 02/10/2012 1343   BUN 17 10/15/2011 1021   CREATININE 1.3* 02/10/2012 1343   CREATININE 1.23* 10/15/2011 1021   CREATININE 1.14* 09/09/2011 1054      Component Value Date/Time   CALCIUM 10.9* 02/10/2012 1343   CALCIUM 10.3 10/15/2011 1021   ALKPHOS 97 02/10/2012 1343   ALKPHOS 81 10/15/2011 1021   AST 28 02/10/2012 1343   AST 24 10/15/2011 1021   ALT 34 02/10/2012 1343   ALT 33 10/15/2011 1021   BILITOT 0.54 02/10/2012 1343   BILITOT 0.4 10/15/2011 1021       RADIOGRAPHIC STUDIES:  No results found.  ASSESSMENT: 61 year old female with:  1.  stage II a invasive ductal carcinoma of the left breast diagnosed in October 2011. She was found to have a ER/PR positive HER-2/neu positive breast cancer.She underwent bilateral mastectomies. Her right mastectomy was an elective prophylactic mastectomy due to her on comfort. Patient subsequently underwent adjuvant chemotherapy consisting of Taxotere carboplatinum and Herceptin for a total of 6 cycles. Thereafter  she received Herceptin every 3 weeks and she has been on Letrozole 2.5 mg daily. Overall she is tolerating it well she has no evidence of recurrent disease.  2. Headaches.  PLAN:  #1Continue Letrozole.  #2. For the headaches I have ordered an MRI of the brain to ensure no involvement by her breast cancer.  I recommend she not have her scarf wrapped so tightly around her head, and that she follow up at her hypertension clinc.  We will follow up with her regarding the results, and make any changes if need be.    All questions were answered. The patient knows to call the clinic with any problems, questions or concerns. We can certainly see the patient much sooner if necessary.  I spent 25 minutes counseling the patient face to face. The total time spent in the appointment was 30 minutes.  This case was reviewed by Dr. Humphrey Rolls.  Suzan Garibaldi [place], [place] Phone: [phone]    02/10/2012, 3:27 PM

## 2012-02-10 NOTE — Patient Instructions (Addendum)
Take two extra strength tylenol every 6 hours as needed for headache.  Do not exceed 8 tablets in 24 hours.  You can also try tramadol for your pain.  We will schedule your MRI.  Please call us if you have any questions or concerns.

## 2012-02-10 NOTE — Telephone Encounter (Signed)
Per NP called patient to schedule appt for labs and NP visit regarding pt's c/o of "shooting pain in head." Pt informed to arrive @ 1345 for labs @ 1400 and 1430 NP appointment. Patient expressed verbal understanding. All questions answered.

## 2012-02-11 ENCOUNTER — Telehealth: Payer: Self-pay | Admitting: Medical Oncology

## 2012-02-11 NOTE — Telephone Encounter (Signed)
Pt called to request prescription for mastectomy product. Dr. Humphrey Rolls informed. Faxed product order form to "[facility]" @ [phone].

## 2012-02-12 ENCOUNTER — Ambulatory Visit (HOSPITAL_COMMUNITY)
Admission: RE | Admit: 2012-02-12 | Discharge: 2012-02-12 | Disposition: A | Discharge status: Home / Self Care | Payer: Medicare Other | Source: Ambulatory Visit | Attending: Adult Health | Admitting: Adult Health

## 2012-02-12 DIAGNOSIS — J329 Chronic sinusitis, unspecified: Secondary | ICD-10-CM | POA: Insufficient documentation

## 2012-02-12 DIAGNOSIS — R51 Headache: Secondary | ICD-10-CM | POA: Insufficient documentation

## 2012-02-12 DIAGNOSIS — Z853 Personal history of malignant neoplasm of breast: Secondary | ICD-10-CM | POA: Insufficient documentation

## 2012-02-12 DIAGNOSIS — C50919 Malignant neoplasm of unspecified site of unspecified female breast: Secondary | ICD-10-CM

## 2012-02-12 DIAGNOSIS — R519 Headache, unspecified: Secondary | ICD-10-CM

## 2012-02-12 MED ORDER — GADOBENATE DIMEGLUMINE 529 MG/ML IV SOLN
20.0000 mL | Freq: Once | INTRAVENOUS | Status: AC | PRN
  Administered 2012-02-12: 20 mL via INTRAVENOUS

## 2012-02-19 ENCOUNTER — Encounter (HOSPITAL_COMMUNITY): Payer: Self-pay | Admitting: *Deleted

## 2012-02-19 ENCOUNTER — Emergency Department (HOSPITAL_COMMUNITY)
Admission: EM | Admit: 2012-02-19 | Discharge: 2012-02-19 | Disposition: A | Discharge status: Home / Self Care | Payer: Medicare Other | Attending: Emergency Medicine | Admitting: Emergency Medicine

## 2012-02-19 DIAGNOSIS — G729 Myopathy, unspecified: Secondary | ICD-10-CM | POA: Insufficient documentation

## 2012-02-19 DIAGNOSIS — M7989 Other specified soft tissue disorders: Secondary | ICD-10-CM

## 2012-02-19 DIAGNOSIS — F329 Major depressive disorder, single episode, unspecified: Secondary | ICD-10-CM | POA: Insufficient documentation

## 2012-02-19 DIAGNOSIS — Z8739 Personal history of other diseases of the musculoskeletal system and connective tissue: Secondary | ICD-10-CM | POA: Insufficient documentation

## 2012-02-19 DIAGNOSIS — Z853 Personal history of malignant neoplasm of breast: Secondary | ICD-10-CM | POA: Insufficient documentation

## 2012-02-19 DIAGNOSIS — Z79899 Other long term (current) drug therapy: Secondary | ICD-10-CM | POA: Insufficient documentation

## 2012-02-19 DIAGNOSIS — Z8709 Personal history of other diseases of the respiratory system: Secondary | ICD-10-CM | POA: Insufficient documentation

## 2012-02-19 DIAGNOSIS — F3289 Other specified depressive episodes: Secondary | ICD-10-CM | POA: Insufficient documentation

## 2012-02-19 DIAGNOSIS — M1990 Unspecified osteoarthritis, unspecified site: Secondary | ICD-10-CM | POA: Insufficient documentation

## 2012-02-19 DIAGNOSIS — I10 Essential (primary) hypertension: Secondary | ICD-10-CM | POA: Insufficient documentation

## 2012-02-19 DIAGNOSIS — E785 Hyperlipidemia, unspecified: Secondary | ICD-10-CM | POA: Insufficient documentation

## 2012-02-19 NOTE — ED Provider Notes (Addendum)
History   This chart was scribed for Maudry Diego, MD by Kathreen Cornfield, ED Scribe. The patient was seen in room TR07C/TR07C and the patient's care was started at 6:07PM.     CSN: [id number]  Arrival date & time 02/19/12  1714   First MD Initiated Contact with Patient 02/19/12 1807      Chief Complaint  Patient presents with  . Extremity Pain    (Consider location/radiation/quality/duration/timing/severity/associated sxs/prior treatment) Patient is a 61 y.o. female presenting with extremity pain. The history is provided by the patient. No language interpreter was used.  Extremity Pain This is a new problem. The current episode started more than 2 days ago. The problem occurs constantly. The problem has been gradually worsening. Pertinent negatives include no chest pain, no abdominal pain and no headaches. Nothing aggravates the symptoms. Nothing relieves the symptoms. She has tried nothing for the symptoms. The treatment provided no relief.    PCP is Dr. Silverio Decamp (Internal Medicine)  Past Medical History  Diagnosis Date  . Depression     seasonal melancholia  . Hyperlipidemia   . Hypertension   . Myopathy     not statin related, cause unknown   . Back pain   . Edema extremities   . Chemotherapy follow-up examination 03/06/2011  . Cough 03/06/2011  . Arthritis     DJD, lumbar spondylosis  . Breast cancer     invasive ductal ca in remission as of 10/07/11 s/p double mastectomy    Past Surgical History  Procedure Date  . Abdominal hysterectomy     for fibroids   . Laminectomy     C5-6 Dr. Louanne Skye 2005   . Oophorectomy   . Mastectomy     bilateral   . Breast surgery 2011    Bilateral mastectomy    Family History  Problem Relation Age of Onset  . Heart disease Mother 35    CAD  . Breast cancer Mother   . Cancer Mother 23    Breast cancer, mother  . Colon cancer Father   . Cancer Father 23    colon cancer dx'ed age 48  . Coronary artery disease Other   . Multiple  sclerosis Daughter     History  Substance Use Topics  . Smoking status: Never Smoker   . Smokeless tobacco: Never Used  . Alcohol Use: No    OB History    Grav Para Term Preterm Abortions TAB SAB Ect Mult Living                  Review of Systems  Constitutional: Negative for fatigue.  HENT: Negative for congestion, sinus pressure and ear discharge.   Eyes: Negative for discharge.  Respiratory: Negative for cough.   Cardiovascular: Negative for chest pain.  Gastrointestinal: Negative for abdominal pain and diarrhea.  Genitourinary: Negative for frequency and hematuria.  Musculoskeletal: Negative for back pain.  Skin: Negative for rash.  Neurological: Negative for seizures and headaches.  Hematological: Negative.   Psychiatric/Behavioral: Negative for hallucinations.    Allergies  Atorvastatin and Rosuvastatin  Home Medications   Current Outpatient Rx  Name  Route  Sig  Dispense  Refill  . ALBUTEROL SULFATE (2.5 MG/3ML) 0.083% IN NEBU   Nebulization   Take 2.5 mg by nebulization 2 (two) times daily as needed. For wheezing         . ALBUTEROL 90 MCG/ACT IN AERS   Inhalation   Inhale 2 puffs into the lungs every 4 (four)  hours as needed. For wheezing         . AMLODIPINE BESYLATE 10 MG PO TABS      TAKE 1 TABLET (10 MG TOTAL) BY MOUTH DAILY.   30 tablet   1   . ASPIRIN 81 MG PO TABS   Oral   Take 81 mg by mouth daily.         Marland Kitchen DIPHENHYD-HYDROCORT-NYSTATIN MT SUSP      Swish and spit a capful per mouth as needed   100 mL   3   . FERROUS SULFATE 325 (65 FE) MG PO TABS   Oral   Take 1 tablet (325 mg total) by mouth 3 (three) times daily with meals.   90 tablet   6   . FLUTICASONE-SALMETEROL 100-50 MCG/DOSE IN AEPB   Inhalation   Inhale 1 puff into the lungs 2 (two) times daily.   14 each   0   . FUROSEMIDE 40 MG PO TABS   Oral   Take 1 tablet (40 mg total) by mouth as needed.   30 tablet   6   . HYDROCORTISONE 2.5 % RE CREA   Rectal    Place 1 application rectally 2 (two) times daily.         Marland Kitchen HYDROCORTISONE ACETATE 25 MG RE SUPP   Rectal   Place 1 suppository (25 mg total) rectally 2 (two) times daily as needed.   20 suppository   6   . LETROZOLE 2.5 MG PO TABS   Oral   Take 2.5 mg by mouth daily.         Marland Kitchen LORAZEPAM 1 MG PO TABS   Oral   Take 1 tablet (1 mg total) by mouth daily as needed. For anxiety   30 tablet   5   . ONE-DAILY MULTI VITAMINS PO TABS   Oral   Take 1 tablet by mouth daily.          Marland Kitchen OLMESARTAN MEDOXOMIL 40 MG PO TABS   Oral   Take 40 mg by mouth daily.           Marland Kitchen PANTOPRAZOLE SODIUM 40 MG PO TBEC   Oral   Take 1 tablet (40 mg total) by mouth daily.   30 tablet   3   . PROCHLORPERAZINE MALEATE 10 MG PO TABS   Oral   Take 10 mg by mouth every 6 (six) hours as needed.         Marland Kitchen PROMETHAZINE-CODEINE 6.25-10 MG/5ML PO SYRP   Oral   Take 5 mLs by mouth every 4 (four) hours as needed.         Marland Kitchen SIMVASTATIN 40 MG PO TABS   Oral   Take 1 tablet (40 mg total) by mouth every evening.   90 tablet   3   . SPIRONOLACTONE 25 MG PO TABS   Oral   Take 1 tablet (25 mg total) by mouth daily.   30 tablet   12   . TRAMADOL HCL 50 MG PO TABS   Oral   Take 50 mg by mouth every 6 (six) hours as needed. For pain            BP 167/78  Pulse 84  Temp 98.2 F (36.8 C) (Oral)  SpO2 99%  LMP 12/31/1968  Physical Exam  Constitutional: She is oriented to person, place, and time. She appears well-developed.  HENT:  Head: Normocephalic.  Eyes: Conjunctivae normal are normal.  Neck: No  tracheal deviation present.  Cardiovascular:  No murmur heard. Musculoskeletal: Normal range of motion.  Neurological: She is oriented to person, place, and time.  Skin: Skin is warm.  Psychiatric: She has a normal mood and affect.  swelling mild to left hand,  Minor tenderness to wrist  ED Course  Procedures (including critical care time)  DIAGNOSTIC STUDIES: Oxygen Saturation is  99% on room air, normal by my interpretation.    COORDINATION OF CARE:  6:13 PM- Treatment plan concerning application of warm compresses discussed with patient. Pt agrees with treatment.      Labs Reviewed - No data to display No results found.   No diagnosis found.    MDM      The chart was scribed for me under my direct supervision.  I personally performed the history, physical, and medical decision making and all procedures in the evaluation of this patient.Maudry Diego, MD 02/19/12 1823  Maudry Diego, MD 02/19/12 [phone]

## 2012-02-19 NOTE — ED Notes (Addendum)
Mri last Friday with contrast.  Pt. Had x 2 iv placed. Pt. Exp. Pain from the iv stick that was placed lower forearm. The pain started shortly after leaving mri. Symptoms constant. Mild swelling in in rt. Lower forearm. MRI to evaluate frequent h/as.

## 2012-02-23 ENCOUNTER — Other Ambulatory Visit: Payer: Self-pay | Admitting: Internal Medicine

## 2012-02-24 ENCOUNTER — Telehealth: Payer: Self-pay | Admitting: Oncology

## 2012-02-24 NOTE — Telephone Encounter (Signed)
lmonvm of the pt adviisng her of the cancelled jan 2014 appts that hve been r/s to feb2014 due to a change in the md's  Schedule. Mailed the pt an updated appt calendar.

## 2012-02-25 ENCOUNTER — Telehealth: Payer: Self-pay | Admitting: Medical Oncology

## 2012-02-25 NOTE — Telephone Encounter (Signed)
Per MD, called patient and informed her that the result of her MRI was good.

## 2012-02-25 NOTE — Telephone Encounter (Signed)
Tell patient MRI was good

## 2012-02-25 NOTE — Telephone Encounter (Signed)
Patient call and LVMOM asking for the results from her "MRI that was done two Friday's ago." Per documentation MRI was completed 02/12/12. Will review with MD. Next scheduled appt with MD is 04/25/12.

## 2012-02-28 IMAGING — CR DG ANKLE COMPLETE 3+V*R*
3 series · 3 of 3 positions shown · non-contrast
Comparison: None

CLINICAL DATA: Fell.  Right ankle pain.

RIGHT ANKLE - COMPLETE 3+ VIEW

[t ankle joint ap right]
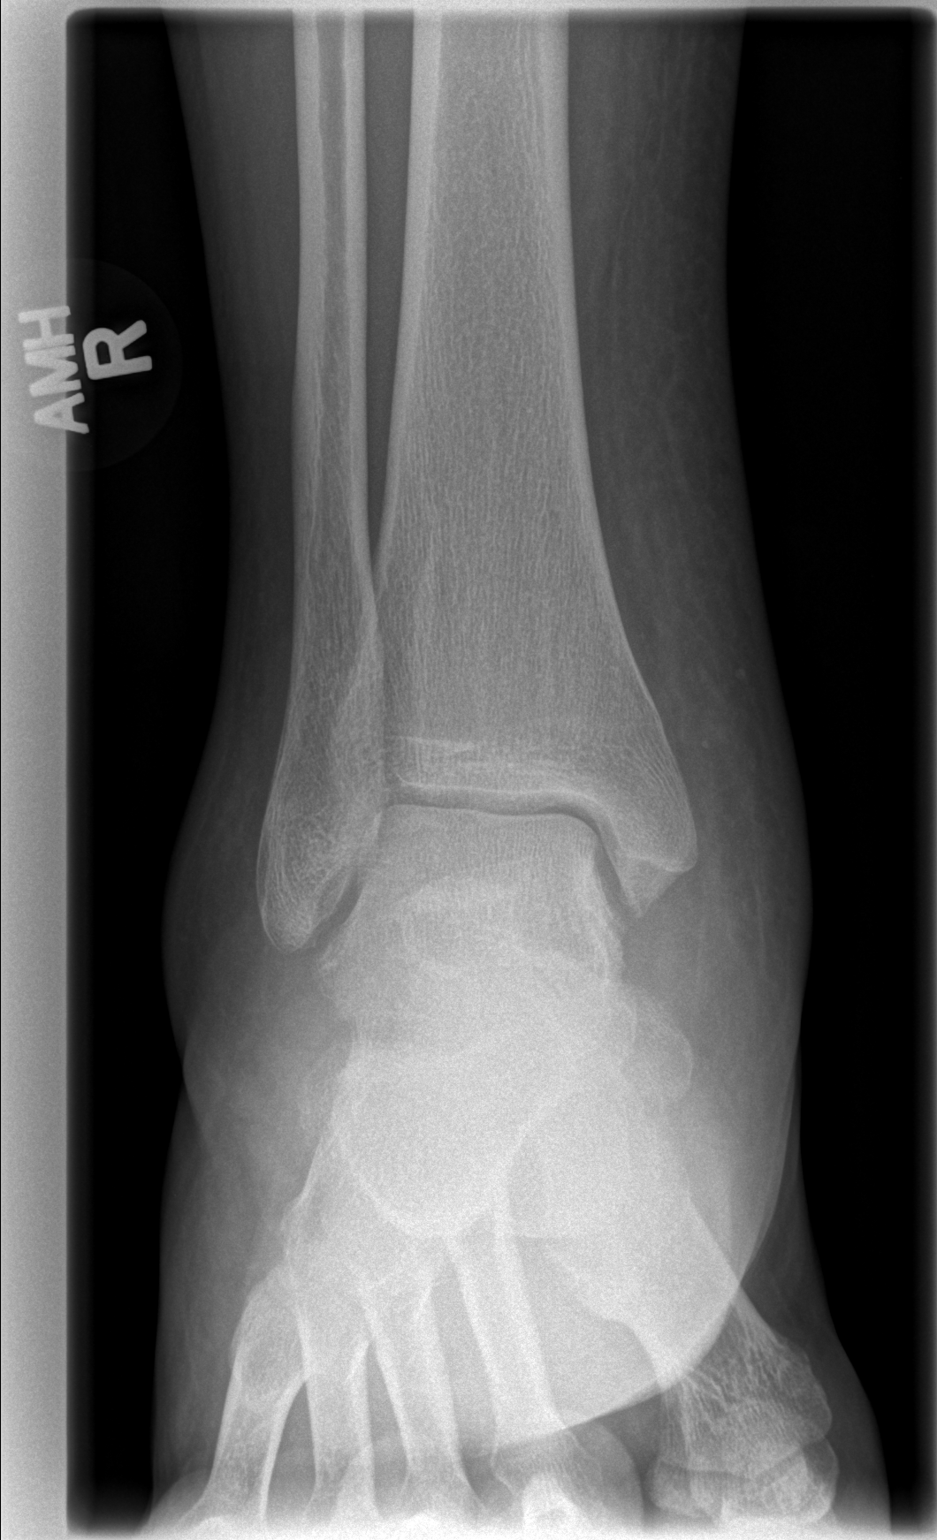

[t ankle joint oblique right]
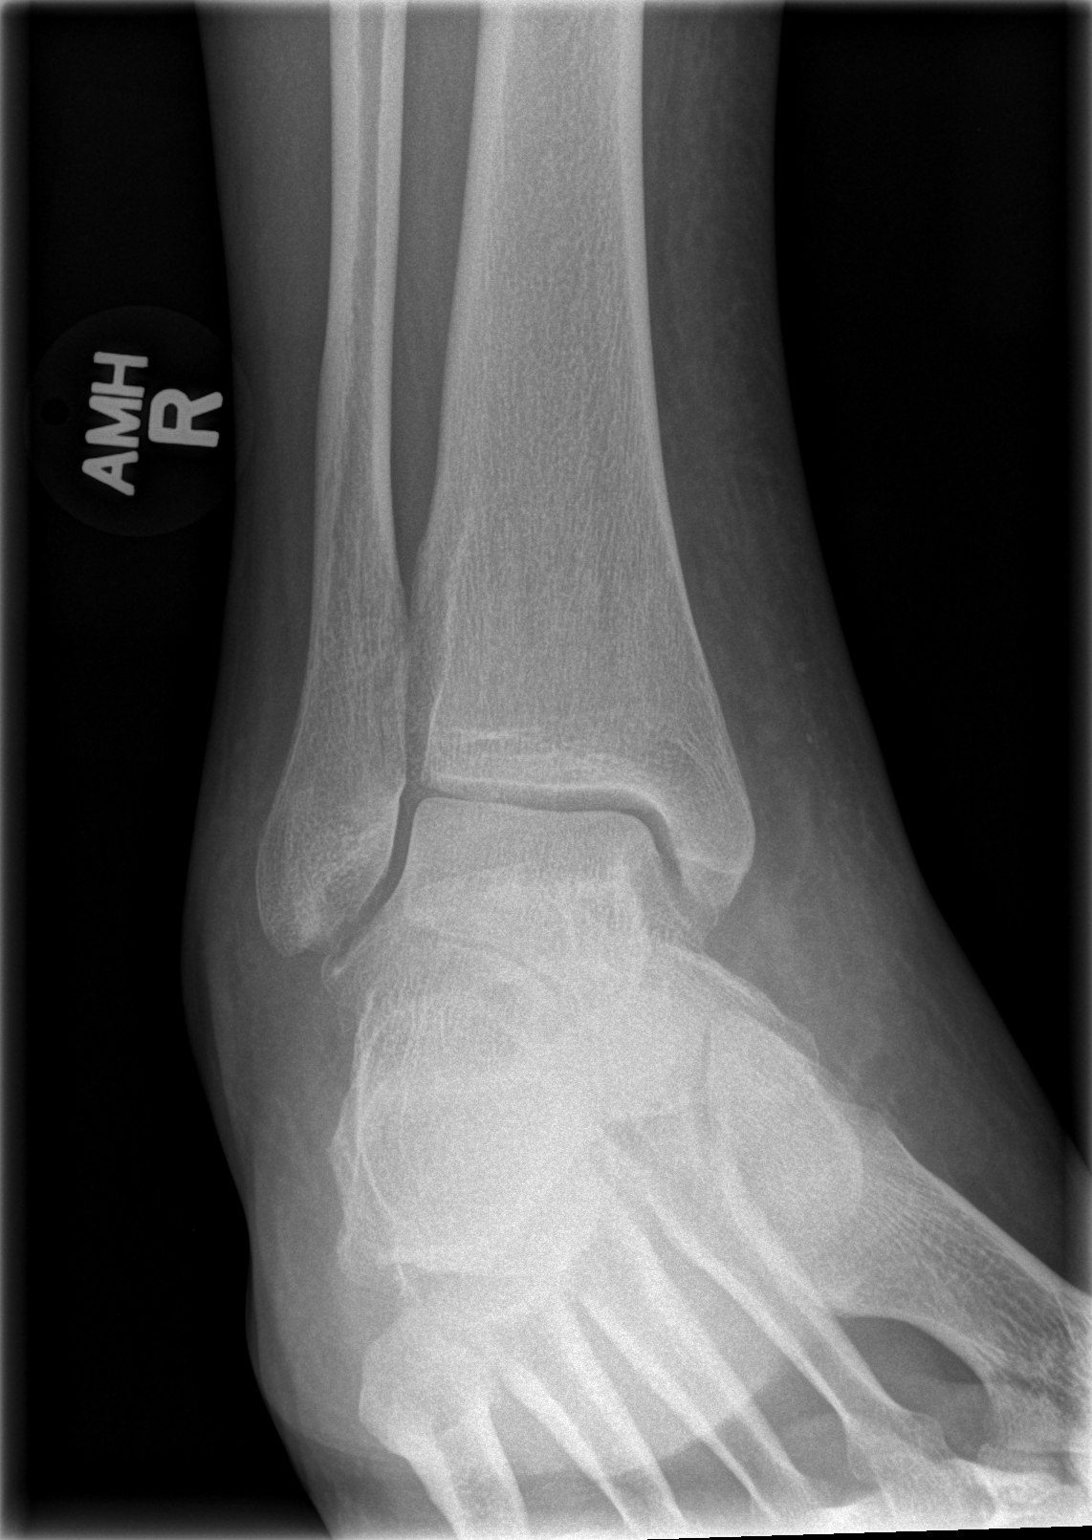

[t ankle joint lat right]
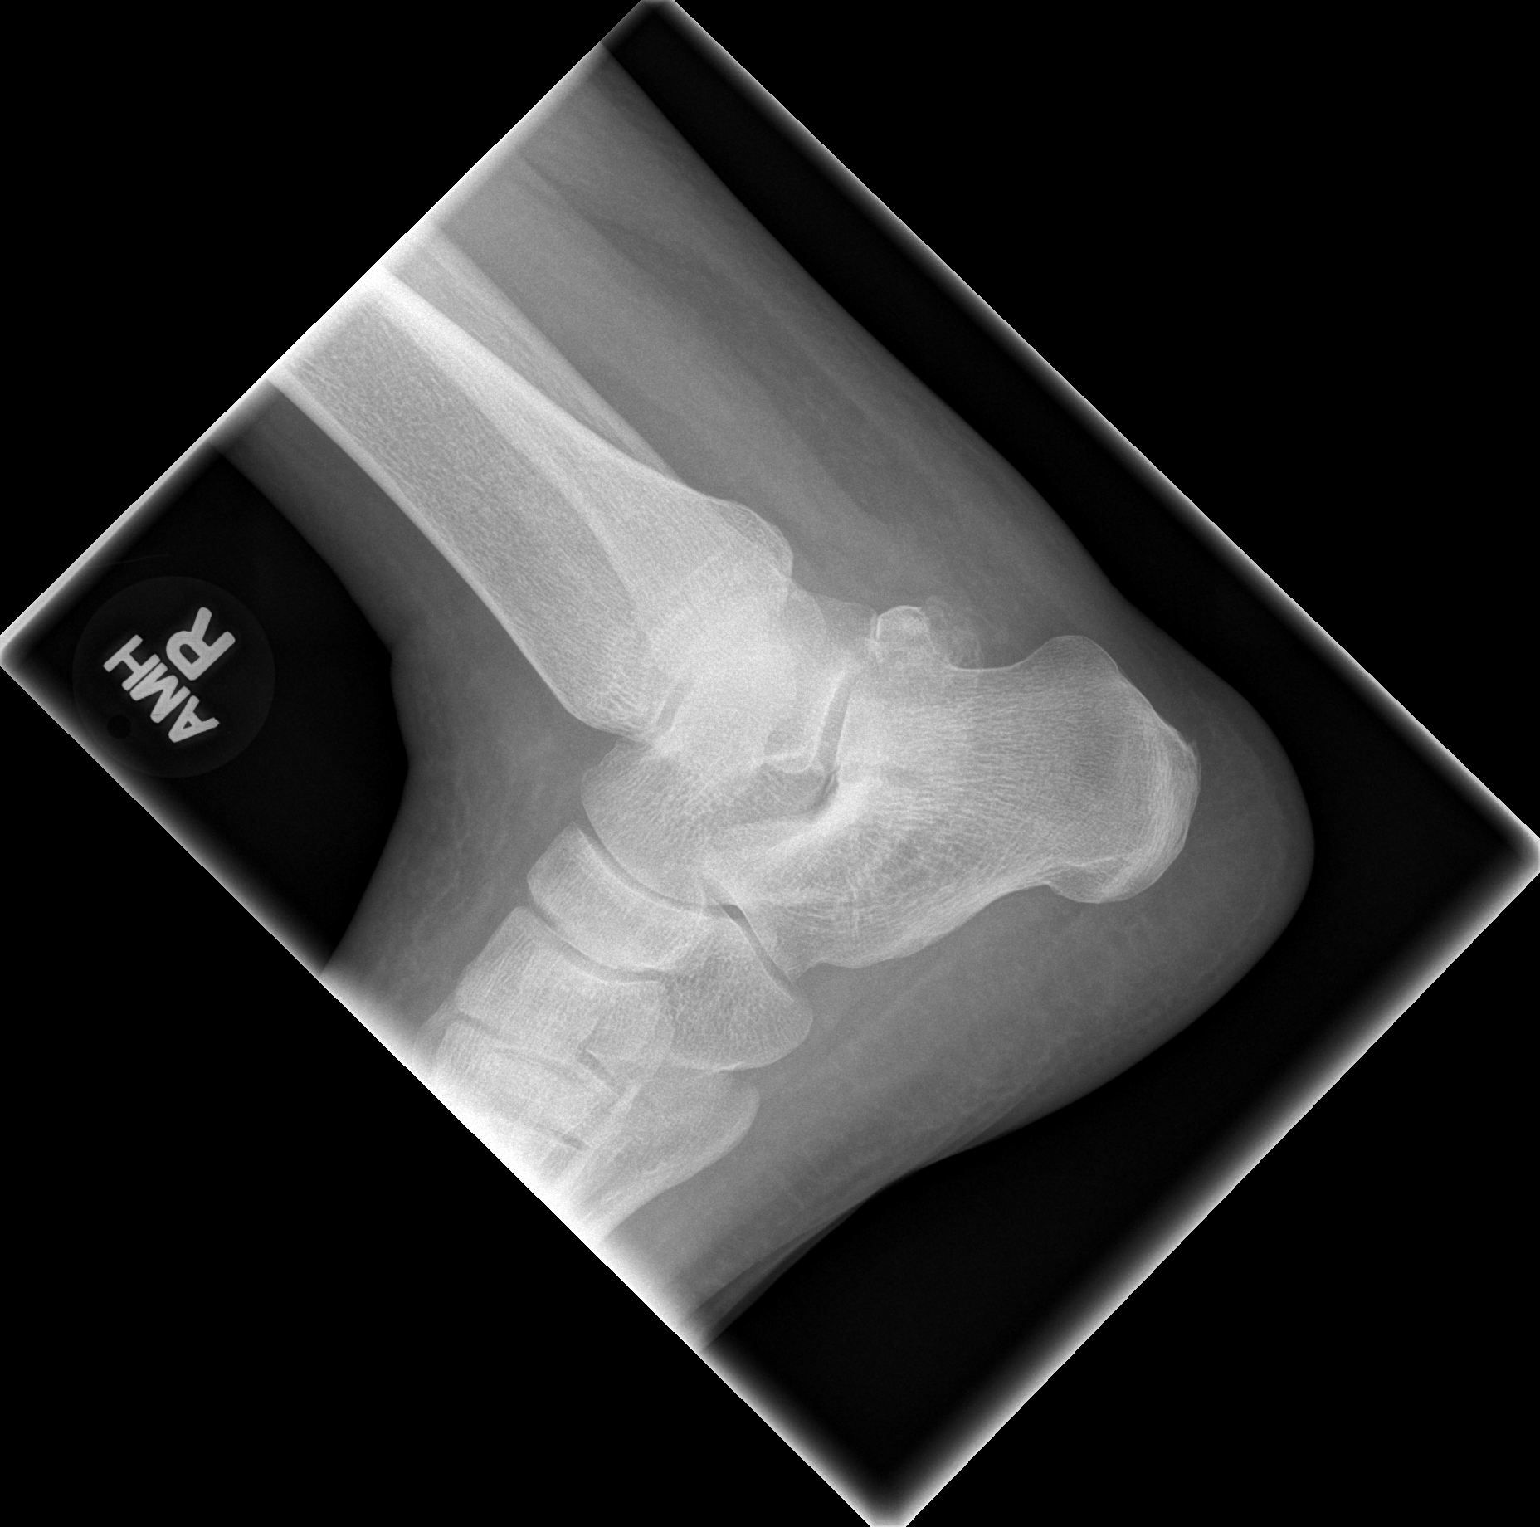

[3 of 3 positions shown; findings below may reference images not displayed]

FINDINGS: The ankle mortise is maintained.  No acute ankle fracture
or osteochondral lesion.  Significant soft tissue swelling is
noted.  Partially fused os trigonum is noted.
IMPRESSION: No acute bony findings.

## 2012-03-02 ENCOUNTER — Ambulatory Visit (INDEPENDENT_AMBULATORY_CARE_PROVIDER_SITE_OTHER): Payer: Medicare Other | Admitting: Radiation Oncology

## 2012-03-02 ENCOUNTER — Encounter: Payer: Self-pay | Admitting: Radiation Oncology

## 2012-03-02 VITALS — BP 143/86 | HR 81 | Temp 98.1°F | Ht 68.0 in | Wt 234.9 lb

## 2012-03-02 DIAGNOSIS — I82C19 Acute embolism and thrombosis of unspecified internal jugular vein: Secondary | ICD-10-CM

## 2012-03-02 DIAGNOSIS — Z86718 Personal history of other venous thrombosis and embolism: Secondary | ICD-10-CM

## 2012-03-02 DIAGNOSIS — M25539 Pain in unspecified wrist: Secondary | ICD-10-CM

## 2012-03-02 MED ORDER — LETROZOLE 2.5 MG PO TABS: 2.5000 mg | ORAL_TABLET | Freq: Every day | ORAL | Status: DC

## 2012-03-02 NOTE — Progress Notes (Signed)
  Subjective:    Patient ID: [name], female    DOB: 15-Jul-1950, 61 y.o.   MRN: [id number]  HPI Patient is a 61 year old woman with past medical history breast cancer status post bilateral mastectomy and adjuvant chemotherapy who presents to clinic today for evaluation of right wrist pain and right anterior neck pain. The patient states that the wrist pain has been present for approximately 2 weeks, and it has improved somewhat during that time. She states that the pain began after receiving contrast in that arm for an MRI of the brain on 02/12/2012. She states she initially had some swelling of her right hand, however this has since resolved, and now she complains only of mild to moderate pain, which is slightly worsened with wrist movement. She denies any recent trauma to this area. She states that she is taking tramadol regularly for chronic back pain, and is unsure if this has helped with her wrist pain( patient admits she has only taken tramadol couple of times since her pain began). In addition to this pain, patient also complains of pain in the area of the right anterior neck. She states the pain is constant. She denies any associated swelling or redness. Of note, patient had an IJ thrombosis associated with a Port-A-Cath back in 11/2010. The Port-A-Cath was removed, and the patient underwent anticoagulation with Lovenox and then Coumadin.  Review of Systems  Constitutional: Negative for fever and chills.  HENT: Positive for neck pain. Negative for facial swelling and neck stiffness.   Eyes: Negative.   Respiratory: Negative for cough and shortness of breath.   Cardiovascular: Negative for chest pain and leg swelling.  Gastrointestinal: Negative for nausea, vomiting, abdominal pain and diarrhea.  Genitourinary: Negative.   Skin: Negative.   Neurological: Negative.   Hematological: Negative.   Psychiatric/Behavioral: Negative.        Objective:   Physical Exam  Constitutional:  She is oriented to person, place, and time. She appears well-developed and well-nourished. No distress.  HENT:  Head: Normocephalic and atraumatic.  Mouth/Throat: Oropharynx is clear and moist.  Eyes: Pupils are equal, round, and reactive to light. No scleral icterus.  Neck: Normal range of motion. Neck supple. No tracheal deviation present. No thyromegaly present.    Abdominal: Soft. Bowel sounds are normal. She exhibits no distension. There is no tenderness.  Musculoskeletal: Normal range of motion. She exhibits no edema and no tenderness.       No abnormalities appreciated in the area of the R wrist. No edema or erythema. No TTP. Full ROM. 5/5 strength in R hand.   Lymphadenopathy:    She has no cervical adenopathy.  Neurological: She is alert and oriented to person, place, and time. No cranial nerve deficit.  Skin: Skin is dry. No erythema.  Psychiatric: She has a normal mood and affect. Her behavior is normal.          Assessment & Plan:

## 2012-03-02 NOTE — Patient Instructions (Addendum)
General instructions:  Please take your tramadol as prescribed--I believe it will help with your left wrist pain while it continues to improve. Please return if your symptoms worsen or fail to improve. It was very nice to meet you.

## 2012-03-03 ENCOUNTER — Emergency Department (HOSPITAL_COMMUNITY)
Admission: EM | Admit: 2012-03-03 | Discharge: 2012-03-03 | Disposition: A | Discharge status: Home / Self Care | Payer: Medicare Other | Attending: Emergency Medicine | Admitting: Emergency Medicine

## 2012-03-03 ENCOUNTER — Encounter (HOSPITAL_COMMUNITY): Payer: Self-pay | Admitting: Emergency Medicine

## 2012-03-03 DIAGNOSIS — I10 Essential (primary) hypertension: Secondary | ICD-10-CM | POA: Insufficient documentation

## 2012-03-03 DIAGNOSIS — Z79899 Other long term (current) drug therapy: Secondary | ICD-10-CM | POA: Insufficient documentation

## 2012-03-03 DIAGNOSIS — Z9221 Personal history of antineoplastic chemotherapy: Secondary | ICD-10-CM | POA: Insufficient documentation

## 2012-03-03 DIAGNOSIS — Z7982 Long term (current) use of aspirin: Secondary | ICD-10-CM | POA: Insufficient documentation

## 2012-03-03 DIAGNOSIS — Z8679 Personal history of other diseases of the circulatory system: Secondary | ICD-10-CM | POA: Insufficient documentation

## 2012-03-03 DIAGNOSIS — E785 Hyperlipidemia, unspecified: Secondary | ICD-10-CM | POA: Insufficient documentation

## 2012-03-03 DIAGNOSIS — Z8739 Personal history of other diseases of the musculoskeletal system and connective tissue: Secondary | ICD-10-CM | POA: Insufficient documentation

## 2012-03-03 DIAGNOSIS — M25539 Pain in unspecified wrist: Secondary | ICD-10-CM | POA: Insufficient documentation

## 2012-03-03 DIAGNOSIS — M129 Arthropathy, unspecified: Secondary | ICD-10-CM | POA: Insufficient documentation

## 2012-03-03 DIAGNOSIS — Z853 Personal history of malignant neoplasm of breast: Secondary | ICD-10-CM | POA: Insufficient documentation

## 2012-03-03 DIAGNOSIS — M25531 Pain in right wrist: Secondary | ICD-10-CM

## 2012-03-03 MED ORDER — HYDROCODONE-ACETAMINOPHEN 5-325 MG PO TABS
2.0000 | ORAL_TABLET | Freq: Once | ORAL | Status: AC
  Administered 2012-03-03: 2 via ORAL
  Filled 2012-03-03: qty 2

## 2012-03-03 MED ORDER — HYDROCODONE-ACETAMINOPHEN 5-325 MG PO TABS: 1.0000 | ORAL_TABLET | Freq: Four times a day (QID) | ORAL | Status: DC | PRN

## 2012-03-03 NOTE — ED Provider Notes (Signed)
History   This chart was scribed for Orlie Dakin, MD by Ludger Nutting, ED Scribe. This patient was seen in room TR09C/TR09C and the patient's care was started at 7:40PM.   CSN: [id number]  Arrival date & time 03/03/12  1833   None     Chief Complaint  Patient presents with  . Wrist Pain     The history is provided by the patient. No language interpreter was used.   [name] is a 61 y.o. female who presents to the Emergency Department complaining of constant, gradually worsening right wrist pain after having IV for an MRI 2 weeks ago. Pt states that it didn't hurt the day of the MRI but the pain worsened the day after. Pain is worse at radial aspect of wrist at the site where she had an IV. Pain is worse with movement improved with remaining still no fever. No numbness no other associated symptoms. Pt states using tramadol with no relief. Pt denies fever or HA.    Pt has h/o back pain, edema in extremities, HTN, hyperlipidemia, breast cancer.   Pt denies smoking and alcohol use. Past Medical History  Diagnosis Date  . Depression     seasonal melancholia  . Hyperlipidemia   . Hypertension   . Myopathy     not statin related, cause unknown   . Back pain   . Edema extremities   . Chemotherapy follow-up examination 03/06/2011  . Cough 03/06/2011  . Arthritis     DJD, lumbar spondylosis  . Breast cancer     invasive ductal ca in remission as of 10/07/11 s/p double mastectomy    Past Surgical History  Procedure Date  . Abdominal hysterectomy     for fibroids   . Laminectomy     C5-6 Dr. Louanne Skye 2005   . Oophorectomy   . Mastectomy     bilateral   . Breast surgery 2011    Bilateral mastectomy    Family History  Problem Relation Age of Onset  . Heart disease Mother 90    CAD  . Breast cancer Mother   . Cancer Mother 44    Breast cancer, mother  . Colon cancer Father   . Cancer Father 12    colon cancer dx'ed age 48  . Coronary artery disease Other   .  Multiple sclerosis Daughter     History  Substance Use Topics  . Smoking status: Never Smoker   . Smokeless tobacco: Never Used  . Alcohol Use: No    No OB history provided.  Review of Systems  Constitutional: Negative.  Negative for fever.  HENT: Negative.   Respiratory: Negative.   Cardiovascular: Negative.   Gastrointestinal: Negative.   Musculoskeletal:       Pain to right wrist   Skin: Negative.   Neurological: Negative.  Negative for headaches.  Hematological: Negative.   Psychiatric/Behavioral: Negative.   All other systems reviewed and are negative.    Allergies  Atorvastatin and Rosuvastatin  Home Medications   Current Outpatient Rx  Name  Route  Sig  Dispense  Refill  . ALBUTEROL 90 MCG/ACT IN AERS   Inhalation   Inhale 2 puffs into the lungs every 4 (four) hours as needed. For wheezing         . AMLODIPINE BESYLATE 10 MG PO TABS      TAKE 1 TABLET (10 MG TOTAL) BY MOUTH DAILY.   30 tablet   1   . ASPIRIN 81 MG  PO TABS   Oral   Take 81 mg by mouth daily.         Marland Kitchen DIPHENHYD-HYDROCORT-NYSTATIN MT SUSP      Swish and spit a capful per mouth as needed   100 mL   3   . FERROUS SULFATE 325 (65 FE) MG PO TABS   Oral   Take 1 tablet (325 mg total) by mouth 3 (three) times daily with meals.   90 tablet   6   . FLUTICASONE-SALMETEROL 100-50 MCG/DOSE IN AEPB   Inhalation   Inhale 1 puff into the lungs 2 (two) times daily.   14 each   0   . FUROSEMIDE 40 MG PO TABS   Oral   Take 1 tablet (40 mg total) by mouth as needed.   30 tablet   6   . HYDROCORTISONE ACETATE 25 MG RE SUPP   Rectal   Place 1 suppository (25 mg total) rectally 2 (two) times daily as needed.   20 suppository   6   . LETROZOLE 2.5 MG PO TABS   Oral   Take 1 tablet (2.5 mg total) by mouth daily.   30 tablet   5   . LORAZEPAM 1 MG PO TABS   Oral   Take 1 tablet (1 mg total) by mouth daily as needed. For anxiety   30 tablet   5   . ONE-DAILY MULTI VITAMINS  PO TABS   Oral   Take 1 tablet by mouth daily.          Marland Kitchen NEXIUM 40 MG PO CPDR   Oral   Take 1 tablet by mouth daily.         Marland Kitchen OLMESARTAN MEDOXOMIL 40 MG PO TABS   Oral   Take 40 mg by mouth daily.           Marland Kitchen OLMESARTAN MEDOXOMIL 40 MG PO TABS   Oral   Take 40 mg by mouth daily.         Marland Kitchen PANTOPRAZOLE SODIUM 40 MG PO TBEC   Oral   Take 1 tablet (40 mg total) by mouth daily.   30 tablet   3   . PROCHLORPERAZINE MALEATE 10 MG PO TABS   Oral   Take 10 mg by mouth every 6 (six) hours as needed. For upset stomach         . SIMVASTATIN 40 MG PO TABS   Oral   Take 1 tablet (40 mg total) by mouth every evening.   90 tablet   3   . SPIRONOLACTONE 25 MG PO TABS   Oral   Take 1 tablet (25 mg total) by mouth daily.   30 tablet   12   . TRAMADOL HCL 50 MG PO TABS   Oral   Take 50 mg by mouth every 6 (six) hours as needed. For pain            BP 169/92  Pulse 97  Temp 98.2 F (36.8 C) (Oral)  Resp 16  SpO2 99%  LMP 12/31/1968  Physical Exam  Nursing note and vitals reviewed. Constitutional: She appears well-developed and well-nourished.  HENT:  Head: Normocephalic and atraumatic.  Eyes: Conjunctivae normal are normal. Pupils are equal, round, and reactive to light.  Neck: Neck supple. No tracheal deviation present. No thyromegaly present.  Cardiovascular: Normal rate and regular rhythm.   No murmur heard. Pulmonary/Chest: Effort normal and breath sounds normal.  Abdominal: Soft. Bowel sounds are normal.  She exhibits no distension. There is no tenderness.  Musculoskeletal: Normal range of motion. She exhibits no edema and no tenderness.       Right upper extremity minimally swollen her wrist, radial aspect, pain on active movement hand with full range of motion without pain radial pulse 2+ all digits with good capillary refill, sensation intact. There is no redness no palpable cords. Other extremities without redness swelling or tenderness  neurovascularly intact  Neurological: She is alert. Coordination normal.  Skin: Skin is warm and dry. No rash noted.  Psychiatric: She has a normal mood and affect.    ED Course  Procedures (including critical care time)  DIAGNOSTIC STUDIES: Oxygen Saturation is 99% on room air, normal by my interpretation.    COORDINATION OF CARE:    Labs Reviewed - No data to display No results found.   No diagnosis found.    MDM  Based on history I feel it patient  superficial thrombophlebitis. Plan Velcro wrist splint prescription Vicodin Will not prescribe NSAIDs due to hypertension. Blood pressure recheck 3 weeks. Referral Dr. Fredna Dow if having significant pain or not improved in 3 or 4 days .the pressure recheck   diagnosis #1 right wrist pain   #2 hypertension I personally performed the services described in this documentation, which was scribed in my presence. The recorded information has been reviewed and is accurate.       Orlie Dakin, MD 03/03/12 2004

## 2012-03-03 NOTE — ED Notes (Signed)
Pt having right wrist pain since having IV for MRI 2 weeks ago

## 2012-03-03 NOTE — Assessment & Plan Note (Signed)
Very unlikely that pt has recurrent IJ thrombosis given she does not have a catheter in place. We will have the patient's R neck imaged via u/s to rule out this possibility however. - R neck doppler U/s

## 2012-03-03 NOTE — Assessment & Plan Note (Signed)
Physical exam was unrevealing for a cause of the patient's R wrist pain. Pt denies any history of trauma, so fracture very unlikely and thus xray of the wrist is not warranted at this time.   03/03/2012 - patient called clinic today, the day after her previous appt, and states that she can now not move her wrist at all from left to right, and was reported to have been screaming in pain on the phone. Pt instructed to go to the ED for evaluation. Unclear what patient's symptoms may represent, particularly as the wrist pain she complained of yesterday had been persisting relatively unchanged for several weeks.

## 2012-03-03 NOTE — ED Notes (Signed)
Ortho notified for right lateral wrist splint

## 2012-03-03 NOTE — Progress Notes (Signed)
Orthopedic Tech Progress Note Patient Details:  [name] 1951/02/12 [id number]  Ortho Devices Type of Ortho Device: Velcro wrist splint Ortho Device/Splint Location: right wrist Ortho Device/Splint Interventions: Application   [name] 03/03/2012, 8:11 PM

## 2012-03-04 ENCOUNTER — Encounter (INDEPENDENT_AMBULATORY_CARE_PROVIDER_SITE_OTHER): Payer: Medicare Other | Admitting: General Surgery

## 2012-03-07 ENCOUNTER — Ambulatory Visit (HOSPITAL_COMMUNITY): Payer: Medicare Other

## 2012-03-07 ENCOUNTER — Ambulatory Visit (HOSPITAL_COMMUNITY)
Admission: RE | Admit: 2012-03-07 | Discharge: 2012-03-07 | Disposition: A | Discharge status: Home / Self Care | Payer: Medicare Other | Source: Ambulatory Visit | Attending: Radiation Oncology | Admitting: Radiation Oncology

## 2012-03-07 DIAGNOSIS — M7989 Other specified soft tissue disorders: Secondary | ICD-10-CM | POA: Insufficient documentation

## 2012-03-07 DIAGNOSIS — M25539 Pain in unspecified wrist: Secondary | ICD-10-CM

## 2012-03-07 DIAGNOSIS — Z86718 Personal history of other venous thrombosis and embolism: Secondary | ICD-10-CM

## 2012-03-07 DIAGNOSIS — M79609 Pain in unspecified limb: Secondary | ICD-10-CM | POA: Insufficient documentation

## 2012-03-07 DIAGNOSIS — Z8672 Personal history of thrombophlebitis: Secondary | ICD-10-CM | POA: Insufficient documentation

## 2012-03-07 NOTE — Addendum Note (Signed)
Addended by: Gae Gallop on: 03/07/2012 08:34 AM   Modules accepted: Orders

## 2012-03-07 NOTE — Progress Notes (Signed)
VASCULAR LAB PRELIMINARY  PRELIMINARY  PRELIMINARY  PRELIMINARY  Right upper extremity venous duplex completed.    Preliminary report: No obvious deep or superficial vein thrombosis noted in the right upper extremity.  The patient was very guarding on the right side of neck.  Unable to perform compression of jugular vein due to pain.  Gray scale inspection, pulsed Doppler and color flow Doppler appeared normal.  There is evidence of collateral flow in the jugular region.  [name], RVT 03/07/2012, 1:14 PM

## 2012-03-10 ENCOUNTER — Telehealth: Payer: Self-pay | Admitting: *Deleted

## 2012-03-10 ENCOUNTER — Encounter: Payer: Self-pay | Admitting: Oncology

## 2012-03-10 ENCOUNTER — Ambulatory Visit (HOSPITAL_BASED_OUTPATIENT_CLINIC_OR_DEPARTMENT_OTHER): Payer: Medicare Other | Admitting: Oncology

## 2012-03-10 VITALS — BP 156/97 | HR 98 | Temp 98.5°F | Resp 20 | Ht 68.0 in | Wt 243.3 lb

## 2012-03-10 DIAGNOSIS — J40 Bronchitis, not specified as acute or chronic: Secondary | ICD-10-CM

## 2012-03-10 DIAGNOSIS — C50919 Malignant neoplasm of unspecified site of unspecified female breast: Secondary | ICD-10-CM

## 2012-03-10 DIAGNOSIS — Z170 Estrogen receptor positive status [ER+]: Secondary | ICD-10-CM

## 2012-03-10 DIAGNOSIS — M25539 Pain in unspecified wrist: Secondary | ICD-10-CM

## 2012-03-10 HISTORY — DX: Bronchitis, not specified as acute or chronic: J40

## 2012-03-10 MED ORDER — AZITHROMYCIN 250 MG PO TABS: ORAL_TABLET | ORAL | Status: DC

## 2012-03-10 NOTE — Telephone Encounter (Signed)
Pt called states " I've got a cold, coughing up yellowish stuff, I've called my PCP and they are not able to see me until after the holiday. About 2 weeks ago, I had an MRI and the IV they put in my Right wrist must have leaked or something because they had to restart another one in my right wrist and it has just been giving me a fit. I've tried warm compresses, alcohol and I just dont know what the next step is." Pt denies fever, headaches, c/o sore throat, "raw" and " hurts". Discussed with pt her concerns will be reviewed by MD and I will call her back with further instructions. Prior encounter note today- Int Med currently trying to reach pt regarding same symptoms.

## 2012-03-10 NOTE — Telephone Encounter (Signed)
Returned call to pt and she is c/o soreness to wrist. She has taken tramadol as ordered, she feels the med makes her sleep but pain returns.  During the night increase pain.   She does use wrist brace.  Denies redness or swelling. She states a U/S was done on wrist but was negative. ( not able to find this report ) Onset of pain to wrist was after that area was used to inject dye for MRI. She has been seen in clinic for this pain and was asked to call back if not improved.  Also pt states her throat is raw and she has a productive cough.  Denies fever. Pt advised to take cough med, throat lozenges, fluids, rest.  Call back if any changes.  Pt # [id number]

## 2012-03-10 NOTE — Telephone Encounter (Signed)
Per MD, Pt to be seen today. Called notified pt who advised she will be here asap. Onc tx sent

## 2012-03-10 NOTE — Telephone Encounter (Signed)
Please have patient go to urgent care for a full evaluation

## 2012-03-10 NOTE — Telephone Encounter (Signed)
PER MD-pt to see MD at 3pm today 03/10/12 . Pt aware

## 2012-03-10 NOTE — Patient Instructions (Addendum)
Begin antibiotic today as directed

## 2012-03-10 NOTE — Telephone Encounter (Signed)
Pt called with c/o cold symptoms and wrist pain. Returned call but no answer.

## 2012-03-10 NOTE — Telephone Encounter (Signed)
Called pt again and talked with daughter.  Pt is being seen at the cancer center today for this problem. Family advised to call back and make an appointment in clinic if pain is not resolved.

## 2012-03-10 NOTE — Telephone Encounter (Signed)
Was seen in ER after last OPC visit for wrist pain and dx with superficial thrombophlebitis. We can offer her an appt to further exam the wrist but nothing I can do over the phone.   URI sxs Ok to tx symptomatically as you advised.   IF she does need OPC appt, please place in long appt slot as she will need more than a simple issue addressed.

## 2012-03-10 NOTE — Telephone Encounter (Signed)
Pt called to schedule appointment but not at home.  Message left to call clinic

## 2012-03-21 ENCOUNTER — Other Ambulatory Visit: Payer: Self-pay | Admitting: Internal Medicine

## 2012-03-21 ENCOUNTER — Other Ambulatory Visit: Payer: Self-pay | Admitting: *Deleted

## 2012-03-24 MED ORDER — PANTOPRAZOLE SODIUM 40 MG PO TBEC: 40.0000 mg | DELAYED_RELEASE_TABLET | Freq: Every day | ORAL | Status: DC

## 2012-03-27 NOTE — Progress Notes (Signed)
OFFICE PROGRESS NOTE  CC [name], MD [name], MD  Julius Bowels, MD [place] [address]  DIAGNOSIS: 62 year old female with  #1 stage II A. ER/PR positive HER-2/neu positive invasive ductal carcinoma of the left breast diagnosed October 2011. Patient is status post bilateral mastectomies.  #2 status post right CVA.  #3 right internal jugular deep venous thrombosis secondary to her Port-A-Cath  #4 status post PICC line placement for ongoing Herceptin therapy.  PRIOR THERAPY:  #1 Status post bilateral mastectomy performed in October 2011  #2 The left breast: final pathology revealed infiltrating ductal carcinoma that was ER positive PR positive HER-2/neu positive. Right breast specimen only revealed benign breast tissue.  #3 patient received adjuvant chemotherapy consisting of weekly Taxotere carboplatinum and Herceptin for a total of 6 cycle.  #4 Patient has completed Herceptin on 04/17/2011.  #5 she has been on letrozole 2.5 mg starting June 2012.  CURRENT THERAPY: Letrozole 2.5 mg daily since June 2012  INTERVAL HISTORY: [name] 62 y.o. female returns today as an add on due cough and congestion..  She also has hypertension.  Otherwise she does not have fever, chills, neck pain, numbness, weakness, vision changes, dizziness, or any other concerns.   MEDICAL HISTORY: Past Medical History  Diagnosis Date  . Depression     seasonal melancholia  . Hyperlipidemia   . Hypertension   . Myopathy     not statin related, cause unknown   . Back pain   . Edema extremities   . Chemotherapy follow-up examination 03/06/2011  . Cough 03/06/2011  . Arthritis     DJD, lumbar spondylosis  . Breast cancer     invasive ductal ca in remission as of 10/07/11 s/p double mastectomy  . Bronchitis 03/10/2012    ALLERGIES:  is allergic to atorvastatin and rosuvastatin.  MEDICATIONS:  Current Outpatient Prescriptions  Medication Sig  Dispense Refill  . albuterol (PROVENTIL,VENTOLIN) 90 MCG/ACT inhaler Inhale 2 puffs into the lungs every 4 (four) hours as needed. For wheezing      . aspirin 81 MG tablet Take 81 mg by mouth daily.      . Diphenhyd-Hydrocort-Nystatin SUSP Swish and spit a capful per mouth as needed  100 mL  3  . ferrous sulfate 325 (65 FE) MG tablet Take 1 tablet (325 mg total) by mouth 3 (three) times daily with meals.  90 tablet  6  . Fluticasone-Salmeterol (ADVAIR DISKUS) 100-50 MCG/DOSE AEPB Inhale 1 puff into the lungs 2 (two) times daily.  14 each  0  . furosemide (LASIX) 40 MG tablet Take 1 tablet (40 mg total) by mouth as needed.  30 tablet  6  . HYDROcodone-acetaminophen (NORCO/VICODIN) 5-325 MG per tablet Take 1 tablet by mouth every 6 (six) hours as needed for pain.  16 tablet  0  . hydrocortisone (ANUSOL-HC) 25 MG suppository Place 1 suppository (25 mg total) rectally 2 (two) times daily as needed.  20 suppository  6  . letrozole (FEMARA) 2.5 MG tablet Take 1 tablet (2.5 mg total) by mouth daily.  30 tablet  5  . LORazepam (ATIVAN) 1 MG tablet Take 1 tablet (1 mg total) by mouth daily as needed. For anxiety  30 tablet  5  . Multiple Vitamin (MULTIVITAMIN) tablet Take 1 tablet by mouth daily.       Marland Kitchen NEXIUM 40 MG capsule Take 1 tablet by mouth daily.      Marland Kitchen olmesartan (BENICAR) 40 MG tablet Take 40 mg  by mouth daily.        Marland Kitchen olmesartan (BENICAR) 40 MG tablet Take 40 mg by mouth daily.      . prochlorperazine (COMPAZINE) 10 MG tablet Take 10 mg by mouth every 6 (six) hours as needed. For upset stomach      . simvastatin (ZOCOR) 40 MG tablet Take 1 tablet (40 mg total) by mouth every evening.  90 tablet  3  . spironolactone (ALDACTONE) 25 MG tablet Take 1 tablet (25 mg total) by mouth daily.  30 tablet  12  . traMADol (ULTRAM) 50 MG tablet Take 50 mg by mouth every 6 (six) hours as needed. For pain       . amLODipine (NORVASC) 10 MG tablet TAKE 1 TABLET BY MOUTH EVERY DAY  30 tablet  1  . azithromycin  (ZITHROMAX Z-PAK) 250 MG tablet Take 2 tabs today then 1 a day until finished  6 each  0  . pantoprazole (PROTONIX) 40 MG tablet Take 1 tablet (40 mg total) by mouth daily.  90 tablet  1   No current facility-administered medications for this visit.   Facility-Administered Medications Ordered in Other Visits  Medication Dose Route Frequency Provider Last Rate Last Dose  . sodium chloride 0.9 % injection 10 mL  10 mL Intravenous PRN Deatra Robinson, MD   10 mL at 01/26/11 1035    SURGICAL HISTORY:  Past Surgical History  Procedure Date  . Abdominal hysterectomy     for fibroids   . Laminectomy     C5-6 Dr. Louanne Skye 2005   . Oophorectomy   . Mastectomy     bilateral   . Breast surgery 2011    Bilateral mastectomy    REVIEW OF SYSTEMS:  General: fatigue (-), night sweats (-), fever (-), pain (-) Lymph: palpable nodes (-) HEENT: vision changes (-), mucositis (-), gum bleeding (-), epistaxis (-) Cardiovascular: chest pain (-), palpitations (-) Pulmonary: shortness of breath (-), dyspnea on exertion (-), cough (-), hemoptysis (-) GI:  Early satiety (-), melena (-), dysphagia (-), nausea/vomiting (-), diarrhea (-) GU: dysuria (-), hematuria (-), incontinence (-) Musculoskeletal: joint swelling (-), joint pain (-), back pain (-) Neuro: weakness (-), numbness (-), headache (-), confusion (-) Skin: Rash (-), lesions (-), dryness (-) Psych: depression (-), suicidal/homicidal ideation (-), feeling of hopelessness (-)   PHYSICAL EXAMINATION:  BP 156/97  Pulse 98  Temp 98.5 F (36.9 C) (Oral)  Resp 20  Ht 5\' 8"  (1.727 m)  Wt 243 lb 4.8 oz (110.36 kg)  BMI 36.99 kg/m2  LMP 12/31/1968 General: Patient is a well appearing female in no acute distress HEENT: PERRLA, sclerae anicteric no conjunctival pallor, MMM Neck: supple, no palpable adenopathy Lungs: clear to auscultation bilaterally, no wheezes, rhonchi, or rales Cardiovascular: regular rate rhythm, S1, S2, no murmurs, rubs or  gallops Abdomen: Soft, non-tender, non-distended, normoactive bowel sounds, no HSM Extremities: warm and well perfused, no clubbing, cyanosis, or edema Skin: No rashes or lesions Neuro: Non-focal Anterior chest wall bilateral mastectomy scars are well healed there is no evidence of local recurrence. ECOG PERFORMANCE STATUS: 0 - Asymptomatic  LABORATORY DATA: Lab Results  Component Value Date   WBC 7.4 02/10/2012   HGB 12.0 02/10/2012   HCT 34.4* 02/10/2012   MCV 85.7 02/10/2012   PLT 234 02/10/2012      Chemistry      Component Value Date/Time   NA 145 02/10/2012 1343   NA 141 10/15/2011 1021   K 4.0 02/10/2012  1343   K 4.4 10/15/2011 1021   CL 110* 02/10/2012 1343   CL 107 10/15/2011 1021   CO2 28 02/10/2012 1343   CO2 27 10/15/2011 1021   BUN 16.0 02/10/2012 1343   BUN 17 10/15/2011 1021   CREATININE 1.3* 02/10/2012 1343   CREATININE 1.23* 10/15/2011 1021   CREATININE 1.14* 09/09/2011 1054      Component Value Date/Time   CALCIUM 10.9* 02/10/2012 1343   CALCIUM 10.3 10/15/2011 1021   ALKPHOS 97 02/10/2012 1343   ALKPHOS 81 10/15/2011 1021   AST 28 02/10/2012 1343   AST 24 10/15/2011 1021   ALT 34 02/10/2012 1343   ALT 33 10/15/2011 1021   BILITOT 0.54 02/10/2012 1343   BILITOT 0.4 10/15/2011 1021       RADIOGRAPHIC STUDIES:  No results found.  ASSESSMENT: 62 year old female with:  1.  stage II a invasive ductal carcinoma of the left breast diagnosed in October 2011. She was found to have a ER/PR positive HER-2/neu positive breast cancer.She underwent bilateral mastectomies. Her right mastectomy was an elective prophylactic mastectomy due to her on comfort. Patient subsequently underwent adjuvant chemotherapy consisting of Taxotere carboplatinum and Herceptin for a total of 6 cycles. Thereafter she received Herceptin every 3 weeks and she has been on Letrozole 2.5 mg daily. Overall she is tolerating it well she has no evidence of recurrent disease.  2 she is now  suffering from tracheobronchitis and we will begin her on empiric antibiotics consisting of azithromycin which she has responded to in the past  #3 patient is also having wrist pain likely due to thrombophlebitis from recent IV insertion she will to symptomatic management.  PLAN:  #1Continue Letrozole.  #2 patient will begin a Z-Pak  All questions were answered. The patient knows to call the clinic with any problems, questions or concerns. We can certainly see the patient much sooner if necessary.  I spent 15 minutes counseling the patient face to face. The total time spent in the appointment was 30 minutes.  Marcy Panning, MD Medical/Oncology [facility] [phone] (beeper) [phone] (Office)  03/27/2012, 9:40 PM

## 2012-04-01 IMAGING — CR DG CHEST 1V PORT
1 series · 1 of 1 positions shown · non-contrast
Comparison: 7548 hours

CLINICAL DATA: PICC line

PORTABLE CHEST - 1 VIEW

[view not recorded]
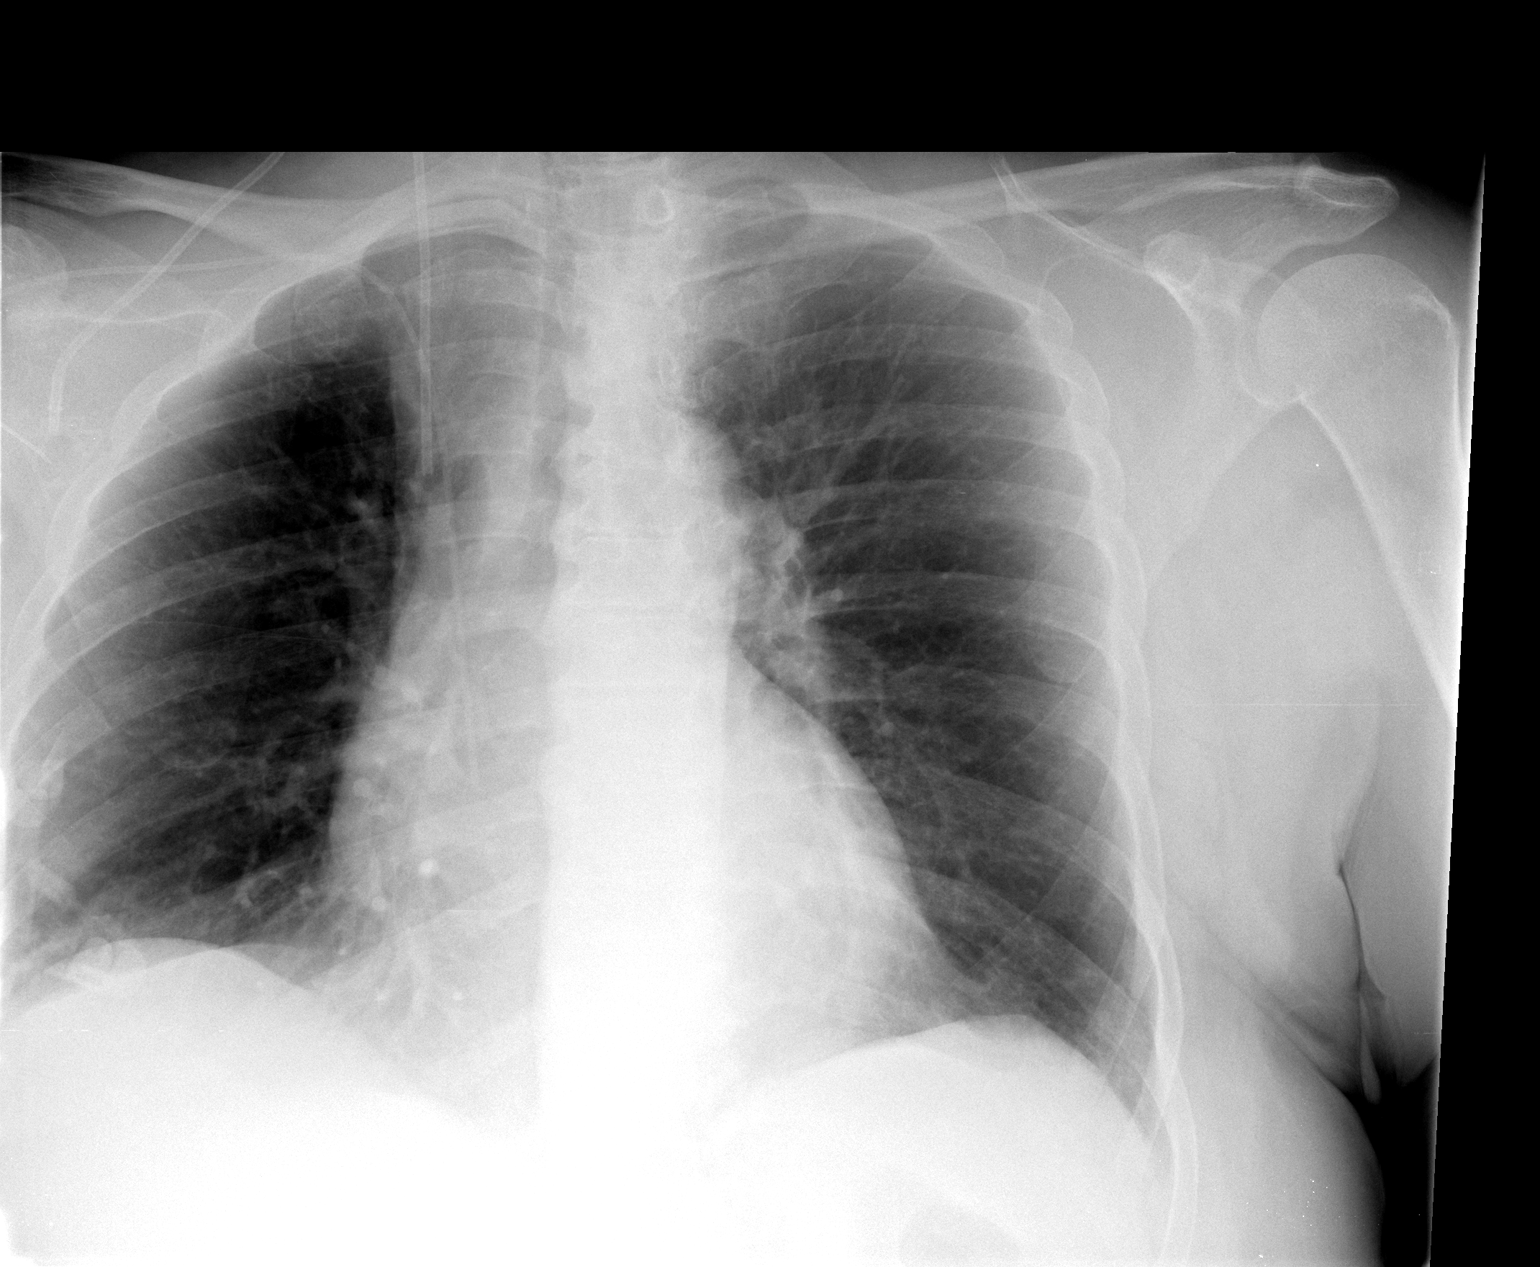

[1 of 1 positions shown; findings below may reference images not displayed]

FINDINGS: PICC tip is at the cavoatrial junction.  Stable Port-A-
Cath.
IMPRESSION: PICC tip is at the cavoatrial junction.

## 2012-04-05 ENCOUNTER — Encounter: Payer: Self-pay | Admitting: Oncology

## 2012-04-05 NOTE — Progress Notes (Signed)
Approved for 100% ind 03/28/12 - 09/25/12. Family 1, total yearly income $12,912. Mailed approval letter along with discount card. Application attached to file.

## 2012-04-14 ENCOUNTER — Ambulatory Visit (INDEPENDENT_AMBULATORY_CARE_PROVIDER_SITE_OTHER): Payer: Medicare Other | Admitting: General Surgery

## 2012-04-14 ENCOUNTER — Encounter (INDEPENDENT_AMBULATORY_CARE_PROVIDER_SITE_OTHER): Payer: Self-pay | Admitting: General Surgery

## 2012-04-14 VITALS — BP 170/100 | HR 102 | Temp 98.5°F | Resp 18 | Ht 68.0 in | Wt 232.0 lb

## 2012-04-14 DIAGNOSIS — C50919 Malignant neoplasm of unspecified site of unspecified female breast: Secondary | ICD-10-CM

## 2012-04-14 NOTE — Progress Notes (Signed)
Chief complaint: Followup breast cancer  History: Patient returns for long-term followup status post bilateral mastectomy for T1 C. M0 HER-2 positive ER/PR positive cancer of the left breast and right prophylactic mastectomy at patient's request.  She states that she has some feeling of occasional swelling in her left axilla and her left arm not exactly feeling right although it is hard for her to characterize this. Possibly some occasional swelling in her left arm. No unusual pain or skin changes lumps.  Exam: BP 170/100  Pulse 102  Temp 98.5 F (36.9 C) (Oral)  Resp 18  Ht 5\' 8"  (1.727 m)  Wt 232 lb (105.235 kg)  BMI 35.28 kg/m2  LMP 12/31/1968 General: Appears well Lymph nodes: No cervical, supraclavicular or axillary nodes palpable Breasts: Well-healed mastectomy incisions bilaterally. No nodules or skin changes. I cannot detect any definite swelling in the axilla or arms.  Assessment and plan: Doing well following bilateral mastectomy with no evidence of early recurrence were significant complication. She may be experiencing occasional mild lymphedema and we'll have her evaluated by physical therapy in the lymphedema clinic. Return 6 months.

## 2012-04-21 ENCOUNTER — Other Ambulatory Visit: Payer: Medicare Other | Admitting: Lab

## 2012-04-21 ENCOUNTER — Telehealth (INDEPENDENT_AMBULATORY_CARE_PROVIDER_SITE_OTHER): Payer: Self-pay

## 2012-04-21 ENCOUNTER — Ambulatory Visit: Payer: Medicare Other | Admitting: Oncology

## 2012-04-21 NOTE — Telephone Encounter (Signed)
Return call to patient, patient has appointment for Physical Therapy on Monday 05/02/12 @ 10:00 for Lymphedema.

## 2012-04-21 NOTE — Telephone Encounter (Signed)
Message copied by Ivor Costa on Thu Apr 21, 2012  4:12 PM ------      Message from: Joline Salt      Created: Wed Apr 20, 2012  9:52 AM      Regarding: Dr. [name]      Contact: [phone]       Pt would like to know about the therapist.  Who she needs to call or see.  Thx

## 2012-04-25 ENCOUNTER — Ambulatory Visit (HOSPITAL_BASED_OUTPATIENT_CLINIC_OR_DEPARTMENT_OTHER): Payer: Medicare Other | Admitting: Oncology

## 2012-04-25 ENCOUNTER — Encounter: Payer: Self-pay | Admitting: Oncology

## 2012-04-25 ENCOUNTER — Other Ambulatory Visit (HOSPITAL_BASED_OUTPATIENT_CLINIC_OR_DEPARTMENT_OTHER): Payer: Medicare Other | Admitting: Lab

## 2012-04-25 ENCOUNTER — Telehealth: Payer: Self-pay | Admitting: Oncology

## 2012-04-25 VITALS — BP 169/82 | HR 93 | Temp 98.5°F | Resp 20 | Ht 68.0 in | Wt 235.5 lb

## 2012-04-25 DIAGNOSIS — C50919 Malignant neoplasm of unspecified site of unspecified female breast: Secondary | ICD-10-CM

## 2012-04-25 DIAGNOSIS — E559 Vitamin D deficiency, unspecified: Secondary | ICD-10-CM

## 2012-04-25 DIAGNOSIS — Z170 Estrogen receptor positive status [ER+]: Secondary | ICD-10-CM

## 2012-04-25 LAB — CBC WITH DIFFERENTIAL/PLATELET
BASO%: 0.9 % (ref 0.0–2.0)
Basophils Absolute: 0.1 10*3/uL (ref 0.0–0.1)
EOS%: 6.7 % (ref 0.0–7.0)
Eosinophils Absolute: 0.4 10*3/uL (ref 0.0–0.5)
HCT: 33.7 % — ABNORMAL LOW (ref 34.8–46.6)
HGB: 11.6 g/dL (ref 11.6–15.9)
LYMPH%: 37.4 % (ref 14.0–49.7)
MCH: 29 pg (ref 25.1–34.0)
MCHC: 34.4 g/dL (ref 31.5–36.0)
MCV: 84.3 fL (ref 79.5–101.0)
MONO#: 0.3 10*3/uL (ref 0.1–0.9)
MONO%: 5.3 % (ref 0.0–14.0)
NEUT#: 2.9 10*3/uL (ref 1.5–6.5)
NEUT%: 49.7 % (ref 38.4–76.8)
Platelets: 230 10*3/uL (ref 145–400)
RBC: 4 10*6/uL (ref 3.70–5.45)
RDW: 13.5 % (ref 11.2–14.5)
WBC: 5.9 10*3/uL (ref 3.9–10.3)
lymph#: 2.2 10*3/uL (ref 0.9–3.3)

## 2012-04-25 LAB — COMPREHENSIVE METABOLIC PANEL (CC13)
ALT: 42 U/L (ref 0–55)
AST: 30 U/L (ref 5–34)
Albumin: 4.1 g/dL (ref 3.5–5.0)
Alkaline Phosphatase: 91 U/L (ref 40–150)
BUN: 20.8 mg/dL (ref 7.0–26.0)
CO2: 27 mEq/L (ref 22–29)
Calcium: 10 mg/dL (ref 8.4–10.4)
Chloride: 103 mEq/L (ref 98–107)
Creatinine: 1.4 mg/dL — ABNORMAL HIGH (ref 0.6–1.1)
Glucose: 134 mg/dl — ABNORMAL HIGH (ref 70–99)
Potassium: 3.8 mEq/L (ref 3.5–5.1)
Sodium: 143 mEq/L (ref 136–145)
Total Bilirubin: 0.74 mg/dL (ref 0.20–1.20)
Total Protein: 7.5 g/dL (ref 6.4–8.3)

## 2012-04-25 MED ORDER — LETROZOLE 2.5 MG PO TABS: 2.5000 mg | ORAL_TABLET | Freq: Every day | ORAL | Status: DC

## 2012-04-25 NOTE — Patient Instructions (Addendum)
Continue letrozole 2.5 mg daily  We will see you back in 9 months

## 2012-04-25 NOTE — Progress Notes (Signed)
OFFICE PROGRESS NOTE  CC [name] Seltzer, MD [name], MD  [name] Bowels, MD [place] [id number]  DIAGNOSIS: 62 year old female with  #1 stage II A. ER/PR positive HER-2/neu positive invasive ductal carcinoma of the left breast diagnosed October 2011. Patient is status post bilateral mastectomies.  #2 status post right CVA.  #3 right internal jugular deep venous thrombosis secondary to her Port-A-Cath  #4 status post PICC line placement for ongoing Herceptin therapy.  PRIOR THERAPY:  #1 Status post bilateral mastectomy performed in October 2011  #2 The left breast: final pathology revealed infiltrating ductal carcinoma that was ER positive PR positive HER-2/neu positive. Right breast specimen only revealed benign breast tissue.  #3 patient received adjuvant chemotherapy consisting of weekly Taxotere carboplatinum and Herceptin for a total of 6 cycle.  #4 Patient has completed Herceptin on 04/17/2011.  #5 she has been on letrozole 2.5 mg starting June 2012.  CURRENT THERAPY: Letrozole 2.5 mg daily since June 2012  INTERVAL HISTORY: [name] 62 y.o. female returns today follow up visit. she does not have fever, chills, neck pain, numbness, weakness, vision changes, dizziness, or any other concerns.   MEDICAL HISTORY: Past Medical History  Diagnosis Date  . Depression     seasonal melancholia  . Hyperlipidemia   . Hypertension   . Myopathy     not statin related, cause unknown   . Back pain   . Edema extremities   . Chemotherapy follow-up examination 03/06/2011  . Cough 03/06/2011  . Arthritis     DJD, lumbar spondylosis  . Breast cancer     invasive ductal ca in remission as of 10/07/11 s/p double mastectomy  . Bronchitis 03/10/2012    ALLERGIES:  is allergic to atorvastatin and rosuvastatin.  MEDICATIONS:  Current Outpatient Prescriptions  Medication Sig Dispense Refill  . albuterol (PROVENTIL,VENTOLIN) 90 MCG/ACT  inhaler Inhale 2 puffs into the lungs every 4 (four) hours as needed. For wheezing      . amLODipine (NORVASC) 10 MG tablet TAKE 1 TABLET BY MOUTH EVERY DAY  30 tablet  1  . aspirin 81 MG tablet Take 81 mg by mouth daily.      . Diphenhyd-Hydrocort-Nystatin SUSP Swish and spit a capful per mouth as needed  100 mL  3  . ferrous sulfate 325 (65 FE) MG tablet Take 1 tablet (325 mg total) by mouth 3 (three) times daily with meals.  90 tablet  6  . Fluticasone-Salmeterol (ADVAIR DISKUS) 100-50 MCG/DOSE AEPB Inhale 1 puff into the lungs 2 (two) times daily.  14 each  0  . furosemide (LASIX) 40 MG tablet Take 1 tablet (40 mg total) by mouth as needed.  30 tablet  6  . HYDROcodone-acetaminophen (NORCO/VICODIN) 5-325 MG per tablet Take 1 tablet by mouth every 6 (six) hours as needed for pain.  16 tablet  0  . hydrocortisone (ANUSOL-HC) 25 MG suppository Place 1 suppository (25 mg total) rectally 2 (two) times daily as needed.  20 suppository  6  . letrozole (FEMARA) 2.5 MG tablet Take 1 tablet (2.5 mg total) by mouth daily.  30 tablet  12  . LORazepam (ATIVAN) 1 MG tablet Take 1 tablet (1 mg total) by mouth daily as needed. For anxiety  30 tablet  5  . Multiple Vitamin (MULTIVITAMIN) tablet Take 1 tablet by mouth daily.       Marland Kitchen olmesartan (BENICAR) 40 MG tablet Take 40 mg by mouth daily.        Marland Kitchen  olmesartan (BENICAR) 40 MG tablet Take 40 mg by mouth daily.      . pantoprazole (PROTONIX) 40 MG tablet Take 1 tablet (40 mg total) by mouth daily.  90 tablet  1  . prochlorperazine (COMPAZINE) 10 MG tablet Take 10 mg by mouth every 6 (six) hours as needed. For upset stomach      . simvastatin (ZOCOR) 40 MG tablet Take 1 tablet (40 mg total) by mouth every evening.  90 tablet  3  . spironolactone (ALDACTONE) 25 MG tablet Take 1 tablet (25 mg total) by mouth daily.  30 tablet  12  . traMADol (ULTRAM) 50 MG tablet Take 50 mg by mouth every 6 (six) hours as needed. For pain       . NEXIUM 40 MG capsule Take 1 tablet  by mouth daily.       No current facility-administered medications for this visit.   Facility-Administered Medications Ordered in Other Visits  Medication Dose Route Frequency Provider Last Rate Last Dose  . sodium chloride 0.9 % injection 10 mL  10 mL Intravenous PRN Deatra Robinson, MD   10 mL at 01/26/11 1035    SURGICAL HISTORY:  Past Surgical History  Procedure Date  . Abdominal hysterectomy     for fibroids   . Laminectomy     C5-6 Dr. Louanne Skye 2005   . Oophorectomy   . Mastectomy     bilateral   . Breast surgery 2011    Bilateral mastectomy    REVIEW OF SYSTEMS:  General: fatigue (-), night sweats (-), fever (-), pain (-) Lymph: palpable nodes (-) HEENT: vision changes (-), mucositis (-), gum bleeding (-), epistaxis (-) Cardiovascular: chest pain (-), palpitations (-) Pulmonary: shortness of breath (-), dyspnea on exertion (-), cough (-), hemoptysis (-) GI:  Early satiety (-), melena (-), dysphagia (-), nausea/vomiting (-), diarrhea (-) GU: dysuria (-), hematuria (-), incontinence (-) Musculoskeletal: joint swelling (-), joint pain (-), back pain (-) Neuro: weakness (-), numbness (-), headache (-), confusion (-) Skin: Rash (-), lesions (-), dryness (-) Psych: depression (-), suicidal/homicidal ideation (-), feeling of hopelessness (-)   PHYSICAL EXAMINATION:  BP 169/82  Pulse 93  Temp 98.5 F (36.9 C) (Oral)  Resp 20  Ht 5\' 8"  (1.727 m)  Wt 235 lb 8 oz (106.822 kg)  BMI 35.81 kg/m2  LMP 12/31/1968 General: Patient is a well appearing female in no acute distress HEENT: PERRLA, sclerae anicteric no conjunctival pallor, MMM Neck: supple, no palpable adenopathy Lungs: clear to auscultation bilaterally, no wheezes, rhonchi, or rales Cardiovascular: regular rate rhythm, S1, S2, no murmurs, rubs or gallops Abdomen: Soft, non-tender, non-distended, normoactive bowel sounds, no HSM Extremities: warm and well perfused, no clubbing, cyanosis, or edema Skin: No rashes or  lesions Neuro: Non-focal Anterior chest wall bilateral mastectomy scars are well healed there is no evidence of local recurrence. ECOG PERFORMANCE STATUS: 0 - Asymptomatic  LABORATORY DATA: Lab Results  Component Value Date   WBC 5.9 04/25/2012   HGB 11.6 04/25/2012   HCT 33.7* 04/25/2012   MCV 84.3 04/25/2012   PLT 230 04/25/2012      Chemistry      Component Value Date/Time   NA 143 04/25/2012 1045   NA 141 10/15/2011 1021   K 3.8 04/25/2012 1045   K 4.4 10/15/2011 1021   CL 103 04/25/2012 1045   CL 107 10/15/2011 1021   CO2 27 04/25/2012 1045   CO2 27 10/15/2011 1021   BUN 20.8 04/25/2012 1045  BUN 17 10/15/2011 1021   CREATININE 1.4* 04/25/2012 1045   CREATININE 1.23* 10/15/2011 1021   CREATININE 1.14* 09/09/2011 1054      Component Value Date/Time   CALCIUM 10.0 04/25/2012 1045   CALCIUM 10.3 10/15/2011 1021   ALKPHOS 91 04/25/2012 1045   ALKPHOS 81 10/15/2011 1021   AST 30 04/25/2012 1045   AST 24 10/15/2011 1021   ALT 42 04/25/2012 1045   ALT 33 10/15/2011 1021   BILITOT 0.74 04/25/2012 1045   BILITOT 0.4 10/15/2011 1021       RADIOGRAPHIC STUDIES:  No results found.  ASSESSMENT: 62 year old female with:  1.  stage II a invasive ductal carcinoma of the left breast diagnosed in October 2011. She was found to have a ER/PR positive HER-2/neu positive breast cancer.She underwent bilateral mastectomies. Her right mastectomy was an elective prophylactic mastectomy due to her on comfort. Patient subsequently underwent adjuvant chemotherapy consisting of Taxotere carboplatinum and Herceptin for a total of 6 cycles. Thereafter she received Herceptin every 3 weeks and she has been on Letrozole 2.5 mg daily. Overall she is tolerating it well she has no evidence of recurrent disease.   PLAN:  #1Continue Letrozole.  #2 we will see you back in 9 months  All questions were answered. The patient knows to call the clinic with any problems, questions or concerns. We can certainly see the patient much sooner  if necessary.  I spent 25 minutes counseling the patient face to face. The total time spent in the appointment was 30 minutes.  Marcy Panning, MD Medical/Oncology [facility] [phone] (beeper) [phone] (Office)  04/25/2012, 11:38 AM

## 2012-04-25 NOTE — Telephone Encounter (Signed)
gv pt dtr appt schedule for October.

## 2012-04-26 LAB — VITAMIN D 25 HYDROXY (VIT D DEFICIENCY, FRACTURES): Vit D, 25-Hydroxy: 38 ng/mL (ref 30–89)

## 2012-04-27 ENCOUNTER — Encounter: Payer: Self-pay | Admitting: Internal Medicine

## 2012-04-27 ENCOUNTER — Ambulatory Visit (INDEPENDENT_AMBULATORY_CARE_PROVIDER_SITE_OTHER): Payer: Medicare Other | Admitting: Internal Medicine

## 2012-04-27 VITALS — BP 148/86 | HR 80 | Temp 98.6°F | Ht 68.0 in | Wt 233.3 lb

## 2012-04-27 DIAGNOSIS — R739 Hyperglycemia, unspecified: Secondary | ICD-10-CM

## 2012-04-27 DIAGNOSIS — M25569 Pain in unspecified knee: Secondary | ICD-10-CM

## 2012-04-27 DIAGNOSIS — R7309 Other abnormal glucose: Secondary | ICD-10-CM

## 2012-04-27 DIAGNOSIS — N259 Disorder resulting from impaired renal tubular function, unspecified: Secondary | ICD-10-CM

## 2012-04-27 DIAGNOSIS — M25562 Pain in left knee: Secondary | ICD-10-CM | POA: Insufficient documentation

## 2012-04-27 LAB — GLUCOSE, CAPILLARY: Glucose-Capillary: 120 mg/dL — ABNORMAL HIGH (ref 70–99)

## 2012-04-27 LAB — POCT GLYCOSYLATED HEMOGLOBIN (HGB A1C): Hemoglobin A1C: 6.5

## 2012-04-27 NOTE — Assessment & Plan Note (Signed)
Knee pain likely osteoarthritis  With crepitus on exam.  Patient reports having had steroid injection in the past about 1.5 yrs ago which worked well so she would like to have another injection today.  Unlikely septic joint as patient is afebrile and this is subacute in duration.  Consent was obtained.  1:1 of 1% lidocain and 40mg  of Kenalog injection.  Left knee joint was Prepped with Povidone-iodine, then cleaned with alcohol swab then numbed with topical spray and injected 2cc with 22G needle.  No complications.  Attending, Dr. Cathren Laine was present during procedure.

## 2012-04-27 NOTE — Patient Instructions (Addendum)
Follow up in 1-2 weeks if no improvement Please diet and exercise

## 2012-04-27 NOTE — Progress Notes (Signed)
Patient ID: PAYSHENCE RINGGOLD, female   DOB: [birth year], 62 y.o.   MRN: [id number] HPI: Ms. Giambra is a very pleasant 63 yo W presents for left knee pain x 2 weeks in duration.  She is able to bear weight but has pain with bending her left knee.  She states that once her knee pops, then she can walk without a lot of pain.  She had similar episode about 1.5 yrs ago and received a steroid injection which resolved her pain since.  Denies any fever or trauma to her knees recently but she did bump into some steel pump over 20 years ago but does not remember which knee?  No other complaints today.  ROS: as per HPI  PE: General: alert, well-developed, and cooperative to examination.  Lungs: normal respiratory effort, no accessory muscle use, normal breath sounds, no crackles, and no wheezes. Heart: normal rate, regular rhythm, no murmur, no gallop, and no rub.  Abdomen: soft, non-tender, normal bowel sounds, no distention, no guarding, no rebound tenderness Neurologic: nonfocal Skin: turgor normal and no rashes.  EXT: left knee: tender to palpation of left patella area as well as with flexion.  Mild effusion noted but no erythema or drainage.   Psych: appropriate

## 2012-04-27 NOTE — Assessment & Plan Note (Signed)
Labs on 04/26/11 by Dr. Humphrey Rolls showed Glu of 134, unclear if that was fasting or not.  She had borderline hyperglycemia and HbA1c was 6.8 in 05/2011.  We discussed lifestyle modifications including diet and exercise for now before starting her on medications. -Will repeat HbA1c today -Continue to monitor

## 2012-04-27 NOTE — Assessment & Plan Note (Addendum)
Unclear etiology.  She does have risk factors such as HTN and mildly elevated glucose/DM.  Her Cr is 1.4 on labs 04/25/12.  It has been steadily increase so we will keep close monitor on her kidney function. She denies any nephrotoxic meds such as NSAIDs. -Repeat BMP in 2-3 months -Avoid nephrotoxic meds -May need renal ultrasound in the future

## 2012-05-02 ENCOUNTER — Ambulatory Visit: Payer: Medicare Other | Attending: General Surgery | Admitting: Physical Therapy

## 2012-05-02 DIAGNOSIS — I890 Lymphedema, not elsewhere classified: Secondary | ICD-10-CM | POA: Insufficient documentation

## 2012-05-02 DIAGNOSIS — M25519 Pain in unspecified shoulder: Secondary | ICD-10-CM | POA: Insufficient documentation

## 2012-05-02 DIAGNOSIS — IMO0001 Reserved for inherently not codable concepts without codable children: Secondary | ICD-10-CM | POA: Insufficient documentation

## 2012-05-04 ENCOUNTER — Ambulatory Visit: Payer: Medicare Other | Admitting: Physical Therapy

## 2012-05-06 ENCOUNTER — Other Ambulatory Visit: Payer: Self-pay | Admitting: Emergency Medicine

## 2012-05-06 MED ORDER — AZITHROMYCIN 250 MG PO TABS: ORAL_TABLET | ORAL | Status: DC

## 2012-05-09 ENCOUNTER — Ambulatory Visit: Payer: Medicare Other

## 2012-05-11 ENCOUNTER — Other Ambulatory Visit: Payer: Self-pay | Admitting: *Deleted

## 2012-05-11 ENCOUNTER — Ambulatory Visit: Payer: Medicare Other | Admitting: Physical Therapy

## 2012-05-11 MED ORDER — FUROSEMIDE 40 MG PO TABS: 40.0000 mg | ORAL_TABLET | ORAL | Status: DC | PRN

## 2012-05-13 ENCOUNTER — Other Ambulatory Visit: Payer: Self-pay | Admitting: *Deleted

## 2012-05-13 ENCOUNTER — Other Ambulatory Visit: Payer: Self-pay | Admitting: Internal Medicine

## 2012-05-13 MED ORDER — TRAMADOL HCL 50 MG PO TABS: 50.0000 mg | ORAL_TABLET | Freq: Four times a day (QID) | ORAL | Status: DC | PRN

## 2012-05-17 ENCOUNTER — Ambulatory Visit: Payer: Medicare Other | Admitting: Physical Therapy

## 2012-05-18 ENCOUNTER — Telehealth (INDEPENDENT_AMBULATORY_CARE_PROVIDER_SITE_OTHER): Payer: Self-pay | Admitting: General Surgery

## 2012-05-18 NOTE — Telephone Encounter (Signed)
Pt called for Rx for bilateral breast prostheses and masty supplies.  Will FAX it to [facility] for her convenience.

## 2012-05-19 ENCOUNTER — Ambulatory Visit: Payer: Medicare Other | Admitting: Physical Therapy

## 2012-05-19 ENCOUNTER — Telehealth (INDEPENDENT_AMBULATORY_CARE_PROVIDER_SITE_OTHER): Payer: Self-pay

## 2012-05-19 NOTE — Telephone Encounter (Signed)
RX for Mastectomy supplies and bilateral breast prosthesis faxed to [facility] [phone].  Fax confirmation rec'd and attached to RX fax.

## 2012-05-24 ENCOUNTER — Other Ambulatory Visit: Payer: Self-pay | Admitting: Internal Medicine

## 2012-05-25 ENCOUNTER — Ambulatory Visit: Payer: Medicare Other | Attending: General Surgery

## 2012-05-25 ENCOUNTER — Ambulatory Visit (INDEPENDENT_AMBULATORY_CARE_PROVIDER_SITE_OTHER): Payer: Medicare Other | Admitting: Internal Medicine

## 2012-05-25 ENCOUNTER — Encounter: Payer: Self-pay | Admitting: *Deleted

## 2012-05-25 ENCOUNTER — Encounter: Payer: Self-pay | Admitting: Internal Medicine

## 2012-05-25 VITALS — BP 153/91 | HR 88 | Temp 98.2°F | Ht 68.0 in | Wt 233.1 lb

## 2012-05-25 DIAGNOSIS — C50919 Malignant neoplasm of unspecified site of unspecified female breast: Secondary | ICD-10-CM

## 2012-05-25 DIAGNOSIS — R7303 Prediabetes: Secondary | ICD-10-CM | POA: Insufficient documentation

## 2012-05-25 DIAGNOSIS — I890 Lymphedema, not elsewhere classified: Secondary | ICD-10-CM | POA: Insufficient documentation

## 2012-05-25 DIAGNOSIS — IMO0001 Reserved for inherently not codable concepts without codable children: Secondary | ICD-10-CM | POA: Insufficient documentation

## 2012-05-25 DIAGNOSIS — N390 Urinary tract infection, site not specified: Secondary | ICD-10-CM

## 2012-05-25 DIAGNOSIS — R7309 Other abnormal glucose: Secondary | ICD-10-CM

## 2012-05-25 DIAGNOSIS — I10 Essential (primary) hypertension: Secondary | ICD-10-CM

## 2012-05-25 DIAGNOSIS — M25519 Pain in unspecified shoulder: Secondary | ICD-10-CM | POA: Insufficient documentation

## 2012-05-25 LAB — POCT URINALYSIS DIPSTICK
Bilirubin, UA: NEGATIVE
Glucose, UA: NEGATIVE
Ketones, UA: NEGATIVE
Nitrite, UA: NEGATIVE
Protein, UA: NEGATIVE
Spec Grav, UA: 1.015
Urobilinogen, UA: 0.2
pH, UA: 6

## 2012-05-25 MED ORDER — AMLODIPINE BESYLATE 10 MG PO TABS: 10.0000 mg | ORAL_TABLET | Freq: Every day | ORAL | Status: DC

## 2012-05-25 MED ORDER — CIPROFLOXACIN HCL 250 MG PO TABS: 250.0000 mg | ORAL_TABLET | Freq: Two times a day (BID) | ORAL | Status: AC

## 2012-05-25 NOTE — Assessment & Plan Note (Signed)
BP Readings from Last 3 Encounters:  05/25/12 153/91  04/27/12 148/86  04/25/12 169/82   Lab Results  Component Value Date   NA 143 04/25/2012   K 3.8 04/25/2012   CREATININE 1.4* 04/25/2012    Assessment:  Blood pressure control: moderately elevated  Progress toward BP goal:  deteriorated  Comments: Initial BP 150/89 and repeat 153/91.  On multiple agents and Cr steady rising.  Last Cr. 1.4  Plan:  Medications:  continue current medications: on Norvasc 10mg , Lasix 40mg  PRN, Benicar 40mg , and Aldactone 25mg  daily  Educational resources provided:    Self management tools provided:  discussed medication adherence.  Requested refill for norvasc today  Other plans: May need to adjust dose of BP medications especially with slowly increasing Cr.  Also norvasc with simvastatin interaction? Encouraged to discuss with pcp and Dr. Haroldine Laws.  She claims she gets medications from both.

## 2012-05-25 NOTE — Progress Notes (Unsigned)
Per [place]. Pt presents c/o yeast infection, wants to be seen, added to dr qureshi's schedule for 0945

## 2012-05-25 NOTE — Assessment & Plan Note (Addendum)
Presenting with 2 day complaints of burning with urination.  Dipstick positive for small blood and moderate leukocytes but negative nitrite.  Has had multiple UTIs in the past and claims it feels the same.  Denies urgency or frequency.  -will give ciprofloxacin 250mg  po bid x3 days -instructed to call if no relief or worsening

## 2012-05-25 NOTE — Assessment & Plan Note (Signed)
Hb A1C down to 6.5 on 04/27/12 when last checked by pcp.  March 2013 was noted to be 6.8.  Counseled extensively on diet control and exercise as much as tolerated.  She is serious about getting glucose under control.    -f/u pcp -will not start medications at this time -diet and exercise modifications recommended

## 2012-05-25 NOTE — Patient Instructions (Signed)
Please take Ciprofloxacin 250mg  by mouth twice DAILY for the total 3 days  I will call you with results of urine culture in case we need to alter the medications  If your symptoms continue, please call or come to clinic [phone]  I have sent your medication and refilled your amlodipine as you asked to [facility]  Please discuss with Dr. Haroldine Laws and PCP in regards to possibly needing to change your HTN medications as your BP was still high today and renal function continues to increase.    Thank you for coming in today and I sincerely hope you feel better soon!

## 2012-05-25 NOTE — Progress Notes (Signed)
Subjective:   Patient ID: [name] female   DOB: Mar 08, 1951 62 y.o.   MRN: [id number]  HPI: [name] is a 62 y.o. pleasant African American female with PMH of stage IIa invasive L breast ductal CA s/p double mastectomy 12/2009 and 1 year chemotherapy (currently on Letrozole), HTN, and asthma presenting to the clinic today for complaints of burning with urination x2 days.  She claims she has had multiple UTIs in the past and this feels similar.  She denies any frequency, itching, or urgency and any abnormal odor or discharge.    She always wishes to have her amlodipine refilled as she is currently out of her prescription.  She has no other complaints at this time.  She denies any fever, chills, N/V/D, headaches, chest pain, shortness of breath, or abdominal pain at this time.    Past Medical History  Diagnosis Date  . Depression     seasonal melancholia  . Hyperlipidemia   . Hypertension   . Myopathy     not statin related, cause unknown   . Back pain   . Edema extremities   . Chemotherapy follow-up examination 03/06/2011  . Cough 03/06/2011  . Arthritis     DJD, lumbar spondylosis  . Breast cancer     invasive ductal ca in remission as of 10/07/11 s/p double mastectomy  . Bronchitis 03/10/2012   Current Outpatient Prescriptions  Medication Sig Dispense Refill  . albuterol (PROVENTIL,VENTOLIN) 90 MCG/ACT inhaler Inhale 2 puffs into the lungs every 4 (four) hours as needed. For wheezing      . amLODipine (NORVASC) 10 MG tablet Take 1 tablet (10 mg total) by mouth daily.  30 tablet  1  . aspirin 81 MG tablet Take 81 mg by mouth daily.      . Diphenhyd-Hydrocort-Nystatin SUSP Swish and spit a capful per mouth as needed  100 mL  3  . ferrous sulfate 325 (65 FE) MG tablet Take 1 tablet (325 mg total) by mouth 3 (three) times daily with meals.  90 tablet  6  . Fluticasone-Salmeterol (ADVAIR DISKUS) 100-50 MCG/DOSE AEPB Inhale 1 puff into the lungs 2 (two) times daily.   14 each  0  . furosemide (LASIX) 40 MG tablet Take 1 tablet (40 mg total) by mouth as needed.  30 tablet  6  . hydrocortisone (ANUSOL-HC) 25 MG suppository Place 1 suppository (25 mg total) rectally 2 (two) times daily as needed.  20 suppository  6  . letrozole (FEMARA) 2.5 MG tablet Take 1 tablet (2.5 mg total) by mouth daily.  30 tablet  12  . LORazepam (ATIVAN) 1 MG tablet Take 1 tablet (1 mg total) by mouth daily as needed. For anxiety  30 tablet  5  . Multiple Vitamin (MULTIVITAMIN) tablet Take 1 tablet by mouth daily.       Marland Kitchen olmesartan (BENICAR) 40 MG tablet Take 40 mg by mouth daily.        . pantoprazole (PROTONIX) 40 MG tablet Take 1 tablet (40 mg total) by mouth daily.  90 tablet  1  . prochlorperazine (COMPAZINE) 10 MG tablet Take 10 mg by mouth every 6 (six) hours as needed. For upset stomach      . simvastatin (ZOCOR) 40 MG tablet Take 1 tablet (40 mg total) by mouth every evening.  90 tablet  3  . spironolactone (ALDACTONE) 25 MG tablet Take 1 tablet (25 mg total) by mouth daily.  30 tablet  12  . traMADol (  ULTRAM) 50 MG tablet Take 1 tablet (50 mg total) by mouth every 6 (six) hours as needed. For pain  30 tablet  5  . ciprofloxacin (CIPRO) 250 MG tablet Take 1 tablet (250 mg total) by mouth 2 (two) times daily.  6 tablet  0   No current facility-administered medications for this visit.   Facility-Administered Medications Ordered in Other Visits  Medication Dose Route Frequency Mikaela Hilgeman Last Rate Last Dose  . sodium chloride 0.9 % injection 10 mL  10 mL Intravenous PRN [name] Robinson, MD   10 mL at 01/26/11 1035   Family History  Problem Relation Age of Onset  . Heart disease Mother 66    CAD  . Breast cancer Mother   . Cancer Mother 15    Breast cancer, mother  . Colon cancer Father   . Cancer Father 39    colon cancer dx'ed age 67  . Coronary artery disease Other   . Multiple sclerosis Daughter    History   Social History  . Marital Status: Single    Spouse  Name: N/A    Number of Children: N/A  . Years of Education: N/A   Social History Main Topics  . Smoking status: Never Smoker   . Smokeless tobacco: Never Used  . Alcohol Use: No  . Drug Use: No  . Sexually Active: None   Other Topics Concern  . None   Social History Narrative   Single, 2 kids   Never smoked   No alcohol use   No drugs   Laid off from [employer] after working there for 14 years.    Review of Systems: Constitutional: Denies fever, chills, diaphoresis, appetite change and fatigue.  HEENT: Denies photophobia, eye pain, redness, hearing loss, ear pain, congestion, sore throat, rhinorrhea, sneezing, mouth sores, trouble swallowing, neck pain, neck stiffness and tinnitus.   Respiratory: Denies SOB, DOE, cough, chest tightness,  and wheezing.   Cardiovascular: Denies chest pain, palpitations and leg swelling.  Gastrointestinal: Denies nausea, vomiting, abdominal pain, diarrhea, constipation, blood in stool and abdominal distention.  Genitourinary: dysuria.  Denies urgency, frequency, hematuria, flank pain and difficulty urinating.  Musculoskeletal: Denies myalgias, back pain, joint swelling, arthralgias and gait problem.  Skin: Denies pallor, rash and wound.  Neurological: Denies dizziness, seizures, syncope, weakness, light-headedness, numbness and headaches.  Hematological: Denies adenopathy. Easy bruising, personal or family bleeding history  Psychiatric/Behavioral: Denies suicidal ideation, mood changes, confusion, nervousness, sleep disturbance and agitation  Objective:  Physical Exam: Filed Vitals:   05/25/12 0944 05/25/12 1019  BP: 158/89 153/91  Pulse: 82 88  Temp: 98.2 F (36.8 C)   TempSrc: Oral   Height: 5\' 8"  (1.727 m)   Weight: 233 lb 1.6 oz (105.733 kg)   SpO2: 99%    Constitutional: Vital signs reviewed.  Patient is a well-developed and well-nourished  in no acute distress and cooperative with exam. Alert and oriented x3.  Head:  Normocephalic and atraumatic Ear: TM normal bilaterally Mouth: no erythema or exudates, MMM Eyes: PERRL, EOMI, conjunctivae normal, No scleral icterus.  Neck: Supple, Trachea midline normal ROM Cardiovascular: RRR, S1 normal, S2 normal, no MRG, pulses symmetric and intact bilaterally Pulmonary/Chest: CTAB, no wheezes, rales, or rhonchi Abdominal: Soft. Non-tender, non-distended, bowel sounds are normal, no masses, organomegaly, or guarding present.  GU: no CVA tenderness Musculoskeletal: No joint deformities, erythema, or stiffness, ROM full and no nontender Hematology: no cervical, inginal, or axillary adenopathy.  Neurological: A&O x3, Strength is normal  and symmetric bilaterally, cranial nerve II-XII are grossly intact, no focal motor deficit, sensory intact to light touch bilaterally.  Skin: Warm, dry and intact. No rash, cyanosis, or clubbing.  Psychiatric: Normal mood and affect. speech and behavior is normal. Judgment and thought content normal. Cognition and memory are normal.   Assessment & Plan:  Discussed with Dr. Eppie Gibson  UTI: moderate leukocytes and small blood on dipstick.  Prescribed 3 days cipro 250mg  po bid.  Urine culture canceled.   HTN: BP still elevated today.  On amlodipine, lasix prn?, spironolactone, and benicar.    Cr continuing to rise.

## 2012-05-27 ENCOUNTER — Ambulatory Visit: Payer: Medicare Other | Admitting: Physical Therapy

## 2012-05-30 ENCOUNTER — Encounter (HOSPITAL_COMMUNITY): Payer: Self-pay

## 2012-05-30 ENCOUNTER — Ambulatory Visit (HOSPITAL_BASED_OUTPATIENT_CLINIC_OR_DEPARTMENT_OTHER)
Admission: RE | Admit: 2012-05-30 | Discharge: 2012-05-30 | Disposition: A | Discharge status: Home / Self Care | Payer: Medicare Other | Source: Ambulatory Visit | Attending: Internal Medicine | Admitting: Internal Medicine

## 2012-05-30 ENCOUNTER — Ambulatory Visit (HOSPITAL_COMMUNITY)
Admission: RE | Admit: 2012-05-30 | Discharge: 2012-05-30 | Disposition: A | Discharge status: Home / Self Care | Payer: Medicare Other | Source: Ambulatory Visit | Attending: Internal Medicine | Admitting: Internal Medicine

## 2012-05-30 VITALS — BP 142/86 | HR 91 | Wt 231.1 lb

## 2012-05-30 DIAGNOSIS — C50919 Malignant neoplasm of unspecified site of unspecified female breast: Secondary | ICD-10-CM

## 2012-05-30 DIAGNOSIS — R5381 Other malaise: Secondary | ICD-10-CM

## 2012-05-30 DIAGNOSIS — R5383 Other fatigue: Secondary | ICD-10-CM

## 2012-05-30 DIAGNOSIS — D4989 Neoplasm of unspecified behavior of other specified sites: Secondary | ICD-10-CM

## 2012-05-30 DIAGNOSIS — I10 Essential (primary) hypertension: Secondary | ICD-10-CM

## 2012-05-30 MED ORDER — CARVEDILOL 6.25 MG PO TABS: 6.2500 mg | ORAL_TABLET | Freq: Two times a day (BID) | ORAL | Status: DC

## 2012-05-30 NOTE — Patient Instructions (Addendum)
Ask Dr. Elsworth Soho about sleep study.  Add carvedilol 6.25 mg twice daily.  Follow up as needed with Dr. Haroldine Laws

## 2012-05-30 NOTE — Progress Notes (Signed)
  Echocardiogram 2D Echocardiogram has been performed.  Annaliese Saez 05/30/2012, 11:58 AM

## 2012-06-01 ENCOUNTER — Ambulatory Visit: Payer: Medicare Other

## 2012-06-01 DIAGNOSIS — R5383 Other fatigue: Secondary | ICD-10-CM | POA: Insufficient documentation

## 2012-06-01 NOTE — Assessment & Plan Note (Signed)
She completed Herceptin last year. EF ok.

## 2012-06-01 NOTE — Assessment & Plan Note (Signed)
Suspect she has OSA. Have referred to pulmonary for evaluation.

## 2012-06-01 NOTE — Assessment & Plan Note (Signed)
BP up. Increase carvedilol to 6.25 bid.

## 2012-06-01 NOTE — Progress Notes (Signed)
HPI: [name] is a 62 y/o women with h/o obesity, HTN, asthma and HL. She also has history of TIA in April 2012.    Found to have L ductal breast CA. ER/PR +. HER 2 neu was equivocal. s/p bilateral mastectomies in 01/2010. Lymph nodes negative.   She completed herceptin therapy Jan. 2013.  Echo April 10, 2010 EF 55-60%. Grade 1 diastolic dusfunction. MUGA 2/12 EF 73%  Echo April 9,2012 EF 60-65% lat s' 10.43 (? Artifact) Echo 12/13/10 60-65% poor windows for lat s' peak I see is 8.09 ECHO 02/24/12 EF 55-60% lateral s' 6.5 ECHO 07/02/11 EF 55-60% lateral S' 6.9 RV mildly dilated but normal function  September 2012 we changed HCTZ to lasix for longstanding LE edema (since her daughter was born in 65).   She returns for follow up today.  She feels well.  She has chronic lower extremity edema that she wears compression hose for as well as prn lasix.  Taking about twice a week.  SBP 150-170s.    Echo today: EF 60-65% lat s' 7.1, Grade 1 diastolic dysfunction   ROS: All systems negative except as listed in HPI, PMH and Problem List.  Past Medical History  Diagnosis Date  . Depression     seasonal melancholia  . Hyperlipidemia   . Hypertension   . Myopathy     not statin related, cause unknown   . Back pain   . Edema extremities   . Chemotherapy follow-up examination 03/06/2011  . Cough 03/06/2011  . Arthritis     DJD, lumbar spondylosis  . Breast cancer     invasive ductal ca in remission as of 10/07/11 s/p double mastectomy  . Bronchitis 03/10/2012   Current Outpatient Prescriptions on File Prior to Encounter  Medication Sig Dispense Refill  . albuterol (PROVENTIL,VENTOLIN) 90 MCG/ACT inhaler Inhale 2 puffs into the lungs every 4 (four) hours as needed. For wheezing      . amLODipine (NORVASC) 10 MG tablet Take 1 tablet (10 mg total) by mouth daily.  30 tablet  1  . aspirin 81 MG tablet Take 81 mg by mouth daily.      . Diphenhyd-Hydrocort-Nystatin SUSP Swish and spit a capful per  mouth as needed  100 mL  3  . ferrous sulfate 325 (65 FE) MG tablet Take 1 tablet (325 mg total) by mouth 3 (three) times daily with meals.  90 tablet  6  . Fluticasone-Salmeterol (ADVAIR DISKUS) 100-50 MCG/DOSE AEPB Inhale 1 puff into the lungs 2 (two) times daily.  14 each  0  . furosemide (LASIX) 40 MG tablet Take 1 tablet (40 mg total) by mouth as needed.  30 tablet  6  . hydrocortisone (ANUSOL-HC) 25 MG suppository Place 1 suppository (25 mg total) rectally 2 (two) times daily as needed.  20 suppository  6  . letrozole (FEMARA) 2.5 MG tablet Take 1 tablet (2.5 mg total) by mouth daily.  30 tablet  12  . LORazepam (ATIVAN) 1 MG tablet Take 1 tablet (1 mg total) by mouth daily as needed. For anxiety  30 tablet  5  . Multiple Vitamin (MULTIVITAMIN) tablet Take 1 tablet by mouth daily.       Marland Kitchen olmesartan (BENICAR) 40 MG tablet Take 40 mg by mouth daily.        . pantoprazole (PROTONIX) 40 MG tablet Take 1 tablet (40 mg total) by mouth daily.  90 tablet  1  . prochlorperazine (COMPAZINE) 10 MG tablet Take 10 mg by  mouth every 6 (six) hours as needed. For upset stomach      . simvastatin (ZOCOR) 40 MG tablet Take 1 tablet (40 mg total) by mouth every evening.  90 tablet  3  . spironolactone (ALDACTONE) 25 MG tablet Take 1 tablet (25 mg total) by mouth daily.  30 tablet  12  . traMADol (ULTRAM) 50 MG tablet Take 1 tablet (50 mg total) by mouth every 6 (six) hours as needed. For pain  30 tablet  5   Current Facility-Administered Medications on File Prior to Encounter  Medication Dose Route Frequency Provider Last Rate Last Dose  . sodium chloride 0.9 % injection 10 mL  10 mL Intravenous PRN Deatra Robinson, MD   10 mL at 01/26/11 1035      PHYSICAL EXAM: Filed Vitals:   05/30/12 1156  BP: 142/86  Pulse: 91  Weight: 231 lb 1.9 oz (104.835 kg)  SpO2: 99%    General:  Well appearing. No resp difficulty HEENT: normal Neck: supple. JVP flat. Carotids 2+ bilaterally; no bruits. No  lymphadenopathy or thryomegaly appreciated. Cor: PMI normal. Regular rate & rhythm. No rubs, murmurs.  Lungs: clear Abdomen: obese, soft, nontender, nondistended. No hepatosplenomegaly. No bruits or masses. Good bowel sounds. Extremities: no cyanosis, clubbing, rash. Tr-1+ pedal. Trace lower extremity edema Neuro: alert & orientedx3, cranial nerves grossly intact. Moves all 4 extremities w/o difficulty. Affect pleasant.    ASSESSMENT & PLAN:

## 2012-06-03 ENCOUNTER — Ambulatory Visit: Payer: Medicare Other

## 2012-06-13 ENCOUNTER — Encounter: Payer: Self-pay | Admitting: Pulmonary Disease

## 2012-06-13 ENCOUNTER — Ambulatory Visit (INDEPENDENT_AMBULATORY_CARE_PROVIDER_SITE_OTHER): Payer: No Typology Code available for payment source | Admitting: Pulmonary Disease

## 2012-06-13 VITALS — BP 138/68 | HR 57 | Temp 98.0°F | Ht 68.0 in | Wt 238.0 lb

## 2012-06-13 DIAGNOSIS — G4733 Obstructive sleep apnea (adult) (pediatric): Secondary | ICD-10-CM | POA: Insufficient documentation

## 2012-06-13 NOTE — Assessment & Plan Note (Signed)
Given excessive daytime somnolence, narrow pharyngeal exam, witnessed apneas & loud snoring, obstructive sleep apnea is very likely & an overnight polysomnogram will be scheduled as a split study. The pathophysiology of obstructive sleep apnea , it's cardiovascular consequences & modes of treatment including CPAP were discused with the patient in detail & they evidenced understanding. Lab study given comorbidities

## 2012-06-13 NOTE — Progress Notes (Signed)
  Subjective:    Patient ID: [name], female    DOB: 04-02-1950, 62 y.o.   MRN: [id number]  HPI  60/F, never smoker, for FU of recurrent cough & wheezing  Attributed to cough variant asthma , sinuses & GERD  She reports wheezing when lying down especially in spring & fall for the last 3 yrs when she was given a diagnosis of asthma. She takes advair & claims compliance during this season. Dyspnea is present on climbing stairs.  She is s/p chemotherapy for Breast cancer (Dr Humphrey Rolls) . Grade 1 diastolic dysfunction noted by Dr Haroldine Laws on echo. She reports pedal edema & has been taking lasix daily. She was on lisinopril for about 2 yrs - changed to olmesartan  Reviewed spirometry 9/09 wnl & 9/12 -no obstruction. 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 Pt states her breathing has been fine. no cough, no wheezing, no chest tx, no congestion, no CP. NO breakthrough reflux on nexium , has stopped advair x 2 wks No PND, orthopnea, pedal edema  ESS 21/24 Bedtime 9-11 pm, Tv stays on through the night, sleep latency 10 mins, 6-7 awakenings for nocturia (takes lasix)  , sleeps on her side x 2 pilows, granddaughter reports loud snoring, oob 0800 feeling tired, no headaches. Wt unchanged last 2 y There is no history suggestive of cataplexy, sleep paralysis or parasomnias    Review of Systems neg for any significant sore throat, dysphagia, itching, sneezing, nasal congestion or excess/ purulent secretions, fever, chills, sweats, unintended wt loss, pleuritic or exertional cp, hempoptysis, orthopnea pnd or change in chronic leg swelling. Also denies presyncope, palpitations, heartburn, abdominal pain, nausea, vomiting, diarrhea or change in  bowel or urinary habits, dysuria,hematuria, rash, arthralgias, visual complaints, headache, numbness weakness or ataxia.      Objective:   Physical Exam  Gen. Pleasant, obese, in no distress, normal affect ENT - no lesions, no post nasal drip, class 2-3 airway Neck: No JVD, no thyromegaly, no carotid bruits Lungs: no use of accessory muscles, no dullness to percussion, decreased without rales or rhonchi  Cardiovascular: Rhythm regular, heart sounds  normal, no murmurs or gallops, no peripheral edema Abdomen: soft and non-tender, no hepatosplenomegaly, BS normal. Musculoskeletal: No deformities, no cyanosis or clubbing Neuro:  alert, non focal, no tremors         Assessment & Plan:

## 2012-06-13 NOTE — Patient Instructions (Signed)
Sleep study will be scheduled Ok to stay off advair - pl call us back if cough/ wheeze returns

## 2012-06-14 ENCOUNTER — Encounter: Payer: Self-pay | Admitting: Internal Medicine

## 2012-06-14 ENCOUNTER — Ambulatory Visit (INDEPENDENT_AMBULATORY_CARE_PROVIDER_SITE_OTHER): Payer: Medicare Other | Admitting: Internal Medicine

## 2012-06-14 VITALS — BP 134/80 | HR 65 | Temp 98.8°F | Ht 68.0 in | Wt 236.3 lb

## 2012-06-14 DIAGNOSIS — Z23 Encounter for immunization: Secondary | ICD-10-CM

## 2012-06-14 DIAGNOSIS — M7989 Other specified soft tissue disorders: Secondary | ICD-10-CM

## 2012-06-14 NOTE — Assessment & Plan Note (Signed)
Unclear trigger  - sinuses/ GERD ACE & coreg stopped  Ok to step down & observe off advair

## 2012-06-14 NOTE — Patient Instructions (Signed)
We will have you try your tramadol for the pain in your hands. We will check a blood test for inflammation to help [place] decide if arthritis may be causing your pain.  We will also give you a splint to use if you are having pain but try to keep using the hands if you can.  Call us if the pain is not getting better or it gets worse.   Our number is [phone].

## 2012-06-14 NOTE — Progress Notes (Signed)
Subjective:     Patient ID: [name], female   DOB: 04/07/1950, 62 y.o.   MRN: [id number]  HPI The patient is a 62 YO female with PMH significant for history of invasive ductal carcinoma, prediabetes, OSA, diastolic dysfunction, cough variant asthma, back and knee pain. She does come in today for hand pain in her left wrist. She states that both wrists as well as her feet have been swollen since 1971. She states that that is unchanged from her baseline. She has never been told that she has arthritis or anything to explain the swelling in her feet and hands. She states that the pain began about a week ago and that she has not tried anything for the pain except warm compresses and rubbing alcohol on them. Both of these were ineffective. She does have tramadol for her back pain however has not trialed best for her hand pain. She states that she did have a similar episode in her right wrist however attributed that to an IV that was started there that initiated the incident. That episode did alleviate on its own. She's not had any fevers or chills at home. She's not having any other can pain. She does not have significant morning stiffness. She is able to use her hand although she states it hurts when she does more activities. She has been self limiting activities due to pain the last week.  Review of Systems  Constitutional: Positive for activity change. Negative for fever, chills, diaphoresis, appetite change, fatigue and unexpected weight change.       Doing less with left hand lately due to pain.  HENT: Negative for hearing loss, ear pain, nosebleeds, congestion, sore throat, facial swelling, rhinorrhea, sneezing, drooling, mouth sores, trouble swallowing, neck pain, neck stiffness, dental problem, voice change, postnasal drip, sinus pressure, tinnitus and ear discharge.   Respiratory: Positive for cough. Negative for choking, chest tightness, shortness of breath and wheezing.        Chronic cough,  unchanged.  Cardiovascular: Negative for chest pain, palpitations and leg swelling.  Gastrointestinal: Negative for nausea, vomiting, abdominal pain, diarrhea, constipation and abdominal distention.  Endocrine: Negative.   Musculoskeletal: Positive for myalgias, back pain and joint swelling.  Skin: Negative for color change, pallor, rash and wound.  Neurological: Negative for dizziness, tremors, syncope, speech difficulty, weakness, light-headedness, numbness and headaches.       Objective:   Physical Exam  Constitutional: She is oriented to person, place, and time. She appears well-developed and well-nourished. No distress.  HENT:  Head: Normocephalic and atraumatic.  Eyes: EOM are normal. Pupils are equal, round, and reactive to light.  Neck: Normal range of motion. Neck supple.  Cardiovascular: Normal rate and normal heart sounds.   Pulmonary/Chest: Effort normal and breath sounds normal. No respiratory distress. She has no wheezes. She has no rales.  Abdominal: Soft. Bowel sounds are normal. She exhibits no distension. There is no tenderness. There is no rebound.  Musculoskeletal: Normal range of motion. She exhibits edema and tenderness.  Some swelling in the wrist and over the carpal bones in bilateral. No redness, calor, full ROM of the hand, wrist, digits.   Neurological: She is alert and oriented to person, place, and time. No cranial nerve deficit.  Skin: She is not diaphoretic.       Assessment/Plan:   1. Left hand pain without trama or injury - History of similar event in right wrist with longstanding history of swelling in hands and feet without obvious  stigmata of longstanding rheumatoid arthritis. Will check ESR. If normal essentially excludes rheumatoid arthritis. If elevated could be inflammation from a number of sources including recent cancer and thrombosis, arthritis, myalgia from hormonal breast cancer therapy. Could also be carpal tunnel syndrome as pain does seem  to worsen with activity. Will trial tramadol for pain and see if episode is ongoing or recurrent. May need further workup if problems returns or does not alleviate.   2. Disposition - Will give TDap at today's visit and check ESR. Will advise her to use tramadol prn for pain and give rx for wrist splint but advised to continue stretching and using the hand if pain alleviates. Advised to follow up with her doctor and to come back if she is having worsening pain or limited mobility of the joint.

## 2012-06-15 LAB — SEDIMENTATION RATE: Sed Rate: 20 mm/hr (ref 0–22)

## 2012-06-19 ENCOUNTER — Encounter (HOSPITAL_COMMUNITY): Payer: Self-pay | Admitting: *Deleted

## 2012-06-19 ENCOUNTER — Emergency Department (HOSPITAL_COMMUNITY): Payer: Medicare Other

## 2012-06-19 ENCOUNTER — Emergency Department (HOSPITAL_COMMUNITY)
Admission: EM | Admit: 2012-06-19 | Discharge: 2012-06-19 | Disposition: A | Discharge status: Home / Self Care | Payer: Medicare Other | Attending: Emergency Medicine | Admitting: Emergency Medicine

## 2012-06-19 DIAGNOSIS — Z7982 Long term (current) use of aspirin: Secondary | ICD-10-CM | POA: Insufficient documentation

## 2012-06-19 DIAGNOSIS — F3289 Other specified depressive episodes: Secondary | ICD-10-CM | POA: Insufficient documentation

## 2012-06-19 DIAGNOSIS — Z79899 Other long term (current) drug therapy: Secondary | ICD-10-CM | POA: Insufficient documentation

## 2012-06-19 DIAGNOSIS — Z853 Personal history of malignant neoplasm of breast: Secondary | ICD-10-CM | POA: Insufficient documentation

## 2012-06-19 DIAGNOSIS — F329 Major depressive disorder, single episode, unspecified: Secondary | ICD-10-CM | POA: Insufficient documentation

## 2012-06-19 DIAGNOSIS — Z87828 Personal history of other (healed) physical injury and trauma: Secondary | ICD-10-CM | POA: Insufficient documentation

## 2012-06-19 DIAGNOSIS — M129 Arthropathy, unspecified: Secondary | ICD-10-CM | POA: Insufficient documentation

## 2012-06-19 DIAGNOSIS — Z8739 Personal history of other diseases of the musculoskeletal system and connective tissue: Secondary | ICD-10-CM | POA: Insufficient documentation

## 2012-06-19 DIAGNOSIS — IMO0002 Reserved for concepts with insufficient information to code with codable children: Secondary | ICD-10-CM | POA: Insufficient documentation

## 2012-06-19 DIAGNOSIS — Z8709 Personal history of other diseases of the respiratory system: Secondary | ICD-10-CM | POA: Insufficient documentation

## 2012-06-19 DIAGNOSIS — M79674 Pain in right toe(s): Secondary | ICD-10-CM

## 2012-06-19 DIAGNOSIS — M25579 Pain in unspecified ankle and joints of unspecified foot: Secondary | ICD-10-CM | POA: Insufficient documentation

## 2012-06-19 DIAGNOSIS — M7989 Other specified soft tissue disorders: Secondary | ICD-10-CM | POA: Insufficient documentation

## 2012-06-19 DIAGNOSIS — I10 Essential (primary) hypertension: Secondary | ICD-10-CM | POA: Insufficient documentation

## 2012-06-19 DIAGNOSIS — E785 Hyperlipidemia, unspecified: Secondary | ICD-10-CM | POA: Insufficient documentation

## 2012-06-19 MED ORDER — OXYCODONE-ACETAMINOPHEN 5-325 MG PO TABS: 1.0000 | ORAL_TABLET | Freq: Once | ORAL | Status: DC

## 2012-06-19 MED ORDER — OXYCODONE-ACETAMINOPHEN 5-325 MG PO TABS
1.0000 | ORAL_TABLET | Freq: Once | ORAL | Status: AC
  Administered 2012-06-19: 1 via ORAL
  Filled 2012-06-19: qty 1

## 2012-06-19 NOTE — ED Notes (Signed)
Pt reports hitting right big toe on something Tuesday, having pain and swelling since. Moderate swelling noted to foot, pain radiates into her lower and upper leg and thinks that the swelling seems to be spreading to left leg. Ambulatory at triage.

## 2012-06-20 ENCOUNTER — Other Ambulatory Visit: Payer: Self-pay | Admitting: *Deleted

## 2012-06-20 ENCOUNTER — Ambulatory Visit (HOSPITAL_BASED_OUTPATIENT_CLINIC_OR_DEPARTMENT_OTHER): Payer: Medicare Other | Attending: Pulmonary Disease | Admitting: Radiology

## 2012-06-20 VITALS — Ht 68.0 in | Wt 237.0 lb

## 2012-06-20 DIAGNOSIS — G4733 Obstructive sleep apnea (adult) (pediatric): Secondary | ICD-10-CM | POA: Insufficient documentation

## 2012-06-21 MED ORDER — PANTOPRAZOLE SODIUM 40 MG PO TBEC: 40.0000 mg | DELAYED_RELEASE_TABLET | Freq: Every day | ORAL | Status: DC

## 2012-06-22 NOTE — ED Provider Notes (Signed)
History     CSN: [id number]  Arrival date & time 06/19/12  1227   First MD Initiated Contact with Patient 06/19/12 1539      Chief Complaint  Patient presents with  . Foot Pain  . Leg Pain     The history is provided by the patient.   patient reports she injured her right great toe several days ago and since then reports throbbing pain that goes up her right leg.  She states she also feels like she's developing a throbbing sensation of pain in her left leg.  She feels like she's noted some swelling of her right foot which is new for her.  History DVT or pulmonary embolism.  No recent immobilization.  Recent trauma as described.  No fevers or chills or redness.  No numbness or tingling or weakness of her bilateral feet   Past Medical History  Diagnosis Date  . Depression     seasonal melancholia  . Hyperlipidemia   . Hypertension   . Myopathy     not statin related, cause unknown   . Back pain   . Edema extremities   . Chemotherapy follow-up examination 03/06/2011  . Cough 03/06/2011  . Arthritis     DJD, lumbar spondylosis  . Breast cancer     invasive ductal ca in remission as of 10/07/11 s/p double mastectomy  . Bronchitis 03/10/2012    Past Surgical History  Procedure Laterality Date  . Abdominal hysterectomy      for fibroids   . Laminectomy      C5-6 Dr. Louanne Skye 2005   . Oophorectomy    . Mastectomy      bilateral   . Breast surgery  2011    Bilateral mastectomy    Family History  Problem Relation Age of Onset  . Heart disease Mother 66    CAD  . Breast cancer Mother   . Cancer Mother 82    Breast cancer, mother  . Colon cancer Father   . Cancer Father 2    colon cancer dx'ed age 28  . Coronary artery disease Other   . Multiple sclerosis Daughter     History  Substance Use Topics  . Smoking status: Never Smoker   . Smokeless tobacco: Never Used  . Alcohol Use: No    OB History   Grav Para Term Preterm Abortions TAB SAB Ect Mult Living             Review of Systems  All other systems reviewed and are negative.    Allergies  Atorvastatin and Rosuvastatin  Home Medications   Current Outpatient Rx  Name  Route  Sig  Dispense  Refill  . albuterol (PROVENTIL,VENTOLIN) 90 MCG/ACT inhaler   Inhalation   Inhale 2 puffs into the lungs every 4 (four) hours as needed. For wheezing         . amLODipine (NORVASC) 10 MG tablet   Oral   Take 1 tablet (10 mg total) by mouth daily.   30 tablet   1   . aspirin 81 MG tablet   Oral   Take 81 mg by mouth daily.         . carvedilol (COREG) 6.25 MG tablet   Oral   Take 1 tablet (6.25 mg total) by mouth 2 (two) times daily with a meal.   60 tablet   6   . ferrous sulfate 325 (65 FE) MG tablet   Oral   Take 1 tablet (  325 mg total) by mouth 3 (three) times daily with meals.   90 tablet   6   . Fluticasone-Salmeterol (ADVAIR) 100-50 MCG/DOSE AEPB   Inhalation   Inhale 1 puff into the lungs 2 (two) times daily as needed (wheezing).          . furosemide (LASIX) 40 MG tablet   Oral   Take 40 mg by mouth as needed (excess fluid).         . hydrocortisone (ANUSOL-HC) 25 MG suppository   Rectal   Place 1 suppository (25 mg total) rectally 2 (two) times daily as needed.   20 suppository   6   . letrozole (FEMARA) 2.5 MG tablet   Oral   Take 1 tablet (2.5 mg total) by mouth daily.   30 tablet   12   . LORazepam (ATIVAN) 1 MG tablet   Oral   Take 1 tablet (1 mg total) by mouth daily as needed. For anxiety   30 tablet   5   . Multiple Vitamin (MULTIVITAMIN) tablet   Oral   Take 1 tablet by mouth daily.          Marland Kitchen olmesartan (BENICAR) 40 MG tablet   Oral   Take 40 mg by mouth daily.           . polyethylene glycol (MIRALAX / GLYCOLAX) packet   Oral   Take 17 g by mouth daily as needed (constipation).         . simvastatin (ZOCOR) 40 MG tablet   Oral   Take 1 tablet (40 mg total) by mouth every evening.   90 tablet   3   . spironolactone  (ALDACTONE) 25 MG tablet   Oral   Take 1 tablet (25 mg total) by mouth daily.   30 tablet   12   . traMADol (ULTRAM) 50 MG tablet   Oral   Take 1 tablet (50 mg total) by mouth every 6 (six) hours as needed. For pain   30 tablet   5   . oxyCODONE-acetaminophen (PERCOCET/ROXICET) 5-325 MG per tablet   Oral   Take 1 tablet by mouth once.   15 tablet   0   . pantoprazole (PROTONIX) 40 MG tablet   Oral   Take 1 tablet (40 mg total) by mouth daily.   90 tablet   1     BP 132/73  Pulse 71  Temp(Src) 98.7 F (37.1 C) (Oral)  Resp 18  SpO2 98%  LMP 12/31/1968  Physical Exam  Nursing note and vitals reviewed. Constitutional: She is oriented to person, place, and time. She appears well-developed and well-nourished. No distress.  HENT:  Head: Normocephalic and atraumatic.  Eyes: EOM are normal.  Neck: Normal range of motion.  Cardiovascular: Normal rate, regular rhythm and normal heart sounds.   Pulmonary/Chest: Effort normal and breath sounds normal.  Abdominal: Soft. She exhibits no distension. There is no tenderness.  Musculoskeletal: Normal range of motion.  Right great toe with mild pain with range of motion of the MTP joint.  No significant swelling or discoloration of her right great toe.  No swelling or tenderness of her right  Foot.  Normal DP and PT pulse on the right foot.  Left leg and foot normal in appearance without tenderness.  Normal left foot pulses.  Neurological: She is alert and oriented to person, place, and time.  Skin: Skin is warm and dry.  Psychiatric: She has a normal mood and affect.  Judgment normal.    ED Course  Procedures (including critical care time)  Labs Reviewed - No data to display No results found.   1. Great toe pain, right       MDM  No significant swelling is noted.  Plain films are negative for fracture.  Discharge home with elevation PCP followup.  My suspicion for deep vein thrombosis is very low.        Hoy Morn, MD 06/22/12 [phone]

## 2012-06-28 DIAGNOSIS — G4730 Sleep apnea, unspecified: Secondary | ICD-10-CM

## 2012-06-28 DIAGNOSIS — G4710 Hypersomnia, unspecified: Secondary | ICD-10-CM

## 2012-06-29 NOTE — Procedures (Signed)
NAME:  [name], [name] NO.:  [id number]  MEDICAL RECORD NO.:  [id number]          PATIENT TYPE:  OUT  LOCATION:  SLEEP CENTER                 FACILITY:  [facility]  PHYSICIAN:  Rigoberto Noel, MD      DATE OF BIRTH:  [birth year]  DATE OF STUDY:  06/20/2012                           NOCTURNAL POLYSOMNOGRAM  REFERRING PHYSICIAN:  Rigoberto Noel, MD  INDICATION FOR STUDY:  [name] Leach is a 62 year old breast cancer survivor with witnessed apneas, loud snoring, and excessive daytime somnolence.  At the time of this study, she weighed 237 pounds with a height of 5 feet 8 inches, BMI of 30, neck size 18.5 inches.  EPWORTH SLEEPINESS SCORE:  21.  MEDICATIONS:  None.  This nocturnal polysomnogram was performed with a sleep technologist in attendance.  EEG, EOG, EMG, EKG, and respiratory parameters were recorded.  Sleep stages, arousals, limb movements, and respiratory data were scored according to criteria laid out by the American Academy of Sleep Medicine.  SLEEP ARCHITECTURE:  Lights out was at 10:38 p.m., lights on was at 5:22 a.m.  Total sleep time was 292 minutes with a sleep period time of 380 minutes and sleep efficiency of 72%.  Sleep latency was 24 minutes. Wake after sleep onset was 89 minutes.  Sleep stages as the percentage of total sleep time was N1 8%, N2 62%, N3 0%, REM sleep 30% back at 88 minutes.  Supine sleep accounted for 16% (46 minutes).  REM sleep was noted in 2 progressive stages with the longest period being around 4 a.m.  AROUSAL DATA:  There were 80 arousals with an arousal index of 15 events per hour.  These 53 were spontaneous and the rest were associated with respiratory events.  RESPIRATORY DATA:  There were total of 35 obstructive apneas, 1 central apnea, 0 mixed apneas, 96 hypopneas with apnea-hypopnea index of 27 events per hour.  31 REARs were noted with an RDI of 33 events per hour. Longest hypopnea was 52 seconds and longest apnea  was 29 seconds.  The REM related AHI was 53 events per hour.  OXYGEN DATA:  The desaturation index was 33 events per hour with the lowest saturation of 74% during REM sleep.  He spent 29 minutes with a saturation less than 88%.  CARDIAC DATA:  The low heart rate was 51 beats per minute.  The high heart rate was 96 beats per minute.  No arrhythmias were noted.  MOVEMENT-PARASOMNIA:  No significant limb movements were noted.  DISCUSSION:  She was desensitized with a medium full face Quattro mask. The events were noted predominantly and REM sleep and on her back.  IMPRESSION: 1. Severe obstructive sleep apnea with hypopneas causing sleep     fragmentation and oxygen desaturation. 2. No evidence of cardiac arrhythmias, limb movements, or behavioral     disturbance during sleep.  RECOMMENDATION: 1. The treatment options for this degree of sleep-disordered breathing     include weight loss and CPAP therapy. 2. CPAP can be solid either as an auto CPAP at home or CPAP titration     study can be performed in the lab for accurate titration.  A medium  fullface mask can be used. 3. She should be cautioned against driving when sleepy.  She should be     asked to avoid medications with sedative side effects.     Rigoberto Noel, MD    RVA/MEDQ  D:  06/28/2012 12:22:58  T:  06/29/2012 01:22:34  Job:  [id number]

## 2012-06-30 ENCOUNTER — Telehealth: Payer: Self-pay | Admitting: Pulmonary Disease

## 2012-06-30 DIAGNOSIS — G4733 Obstructive sleep apnea (adult) (pediatric): Secondary | ICD-10-CM

## 2012-06-30 NOTE — Telephone Encounter (Signed)
Sleep study did show obstructive sleep apnea . Send Rx for autoCPAP 5-15 with med fullface mask, humidity , download & FU OV in 4 wks - if pt willing to proceed as discussed

## 2012-07-05 ENCOUNTER — Telehealth: Payer: Self-pay | Admitting: Pulmonary Disease

## 2012-07-05 NOTE — Telephone Encounter (Signed)
See note 06/30/12. Will sign off this one

## 2012-07-05 NOTE — Telephone Encounter (Signed)
LMOMTCB x 1 

## 2012-07-06 NOTE — Telephone Encounter (Signed)
LMTCB

## 2012-07-06 NOTE — Telephone Encounter (Signed)
Returning call can be reached at [phone].[name]

## 2012-07-07 NOTE — Telephone Encounter (Signed)
LMOMTCB X1 FOR PT

## 2012-07-12 NOTE — Telephone Encounter (Signed)
Patient returning Mindy's call.  [id number]

## 2012-07-12 NOTE — Telephone Encounter (Signed)
LMTCB and will send to Mindy to f/u on

## 2012-07-12 NOTE — Telephone Encounter (Signed)
LMOMTCB X3 FOR PT

## 2012-07-13 NOTE — Telephone Encounter (Signed)
lmomtcb x1 

## 2012-07-13 NOTE — Telephone Encounter (Signed)
I spoke with patient about results and she verbalized understanding and had no questions. Order sent and once set up pt will call for OV.

## 2012-07-13 NOTE — Telephone Encounter (Signed)
Returning call can be reached at [phone].[name]

## 2012-07-14 ENCOUNTER — Telehealth: Payer: Self-pay | Admitting: Pulmonary Disease

## 2012-07-14 DIAGNOSIS — G4733 Obstructive sleep apnea (adult) (pediatric): Secondary | ICD-10-CM

## 2012-07-14 NOTE — Telephone Encounter (Signed)
Pt aware order sent. Nothing further was needed

## 2012-07-14 NOTE — Telephone Encounter (Signed)
Pt states that she was informed to wear CPAP. Pt states that mask she was ordered was the full face mask and she felt very claustrophobic Pt states that she tried two different masks full face and nasal pillows and was able to sleep all night with this particular mask.  Requesting new mask order for the nasal pillows instead of full face.  Contact # [phone] once order is placed.   Dr Elsworth Soho please advise. Thanks.

## 2012-07-14 NOTE — Telephone Encounter (Signed)
Ok to order nasal pillows

## 2012-07-17 ENCOUNTER — Other Ambulatory Visit: Payer: Self-pay | Admitting: Internal Medicine

## 2012-08-02 ENCOUNTER — Telehealth: Payer: Self-pay | Admitting: Pulmonary Disease

## 2012-08-02 NOTE — Telephone Encounter (Signed)
Order for nasal pillows was giving to [facility] back in April.   Melissa is going to check on this and call us back  lmtcb x1 for pt to make her aware

## 2012-08-03 NOTE — Telephone Encounter (Signed)
Melissa @ [facility] called to say that they had not received an order for cpap until "today". Says it's now in epic where she can view it and will take care of this today. [phone]. Mariann Laster

## 2012-08-03 NOTE — Telephone Encounter (Signed)
Spoke to melissathis am she does see the order and will get it taken care of asap Joellen Jersey

## 2012-08-03 NOTE — Telephone Encounter (Signed)
lmomtcb x1 for pt And according to EPIC this was giving to [facility] back in April so will forward to [facility]' s so they are aware.

## 2012-08-03 NOTE — Telephone Encounter (Signed)
I spoke with [name] and she stated they do not have pt set up with CPAP through them but will do more checking on this.

## 2012-08-03 NOTE — Telephone Encounter (Signed)
lmomtcb for [name] to see if she's had a chance to look at this yet. lmtcb on pt's home and cell #s to inform her of this.

## 2012-08-17 ENCOUNTER — Encounter (HOSPITAL_COMMUNITY): Payer: Self-pay | Admitting: Emergency Medicine

## 2012-08-17 ENCOUNTER — Emergency Department (HOSPITAL_COMMUNITY)
Admission: EM | Admit: 2012-08-17 | Discharge: 2012-08-17 | Disposition: A | Discharge status: Home / Self Care | Payer: Medicare Other | Attending: Emergency Medicine | Admitting: Emergency Medicine

## 2012-08-17 DIAGNOSIS — E785 Hyperlipidemia, unspecified: Secondary | ICD-10-CM | POA: Insufficient documentation

## 2012-08-17 DIAGNOSIS — Z853 Personal history of malignant neoplasm of breast: Secondary | ICD-10-CM | POA: Insufficient documentation

## 2012-08-17 DIAGNOSIS — Z8739 Personal history of other diseases of the musculoskeletal system and connective tissue: Secondary | ICD-10-CM | POA: Insufficient documentation

## 2012-08-17 DIAGNOSIS — M79609 Pain in unspecified limb: Secondary | ICD-10-CM | POA: Insufficient documentation

## 2012-08-17 DIAGNOSIS — Z7982 Long term (current) use of aspirin: Secondary | ICD-10-CM | POA: Insufficient documentation

## 2012-08-17 DIAGNOSIS — Z8709 Personal history of other diseases of the respiratory system: Secondary | ICD-10-CM | POA: Insufficient documentation

## 2012-08-17 DIAGNOSIS — I10 Essential (primary) hypertension: Secondary | ICD-10-CM | POA: Insufficient documentation

## 2012-08-17 DIAGNOSIS — M129 Arthropathy, unspecified: Secondary | ICD-10-CM | POA: Insufficient documentation

## 2012-08-17 DIAGNOSIS — IMO0002 Reserved for concepts with insufficient information to code with codable children: Secondary | ICD-10-CM | POA: Insufficient documentation

## 2012-08-17 DIAGNOSIS — Z87828 Personal history of other (healed) physical injury and trauma: Secondary | ICD-10-CM | POA: Insufficient documentation

## 2012-08-17 DIAGNOSIS — J45909 Unspecified asthma, uncomplicated: Secondary | ICD-10-CM | POA: Insufficient documentation

## 2012-08-17 DIAGNOSIS — M79674 Pain in right toe(s): Secondary | ICD-10-CM

## 2012-08-17 DIAGNOSIS — Z79899 Other long term (current) drug therapy: Secondary | ICD-10-CM | POA: Insufficient documentation

## 2012-08-17 DIAGNOSIS — K219 Gastro-esophageal reflux disease without esophagitis: Secondary | ICD-10-CM | POA: Insufficient documentation

## 2012-08-17 DIAGNOSIS — F329 Major depressive disorder, single episode, unspecified: Secondary | ICD-10-CM | POA: Insufficient documentation

## 2012-08-17 DIAGNOSIS — F3289 Other specified depressive episodes: Secondary | ICD-10-CM | POA: Insufficient documentation

## 2012-08-17 HISTORY — DX: Gastro-esophageal reflux disease without esophagitis: K21.9

## 2012-08-17 HISTORY — DX: Unspecified asthma, uncomplicated: J45.909

## 2012-08-17 MED ORDER — HYDROCODONE-ACETAMINOPHEN 5-325 MG PO TABS: 1.0000 | ORAL_TABLET | ORAL | Status: DC | PRN

## 2012-08-17 NOTE — ED Notes (Signed)
Pt states she hit her great right toe on table and tore skin to lateral side of nail in March. Pt concerned because pain just started again recently and she was told she was "border-line diabetic" and thought it should be checked out. Toe has no swelling, redness. Toe has good perfusion and pt is able to ambulate and move toe.

## 2012-08-17 NOTE — ED Notes (Addendum)
C/o pain to R great toe since hitting it under table on 06/14/12.  Pt was seen in ED at that time and states it has still not healed.

## 2012-08-17 NOTE — ED Provider Notes (Signed)
History  This chart was scribed for Leota Jacobsen, MD by Elby Beck, ED Scribe. This patient was seen in room TR09C/TR09C and the patient's care was started at 7:36 PM.   CSN: [id number]  Arrival date & time 08/17/12  1802   Chief Complaint  Patient presents with  . Toe Pain    The history is provided by the patient. No language interpreter was used.    HPI Comments: [name] is a 62 y.o. female who presents to the Emergency Department complaining of intermittent, moderate right great toe pain since hitting it under a table in March. Pt was seen in the ED at that time and states that the toe still has not healed. Pt states that there is no drainage or swelling in the toe. Pt states that the pain does not radiate. Pt denies using alcohol and smoking.   Past Medical History  Diagnosis Date  . Depression     seasonal melancholia  . Hyperlipidemia   . Hypertension   . Myopathy     not statin related, cause unknown   . Back pain   . Edema extremities   . Chemotherapy follow-up examination 03/06/2011  . Cough 03/06/2011  . Arthritis     DJD, lumbar spondylosis  . Breast cancer     invasive ductal ca in remission as of 10/07/11 s/p double mastectomy  . Bronchitis 03/10/2012  . Asthma   . GERD (gastroesophageal reflux disease)     Past Surgical History  Procedure Laterality Date  . Abdominal hysterectomy      for fibroids   . Laminectomy      C5-6 Dr. Louanne Skye 2005   . Oophorectomy    . Mastectomy      bilateral   . Breast surgery  2011    Bilateral mastectomy  . Tonsillectomy    . Appendectomy      Family History  Problem Relation Age of Onset  . Heart disease Mother 34    CAD  . Breast cancer Mother   . Cancer Mother 40    Breast cancer, mother  . Colon cancer Father   . Cancer Father 53    colon cancer dx'ed age 47  . Coronary artery disease Other   . Multiple sclerosis Daughter     History  Substance Use Topics  . Smoking status: Never  Smoker   . Smokeless tobacco: Never Used  . Alcohol Use: No    OB History   Grav Para Term Preterm Abortions TAB SAB Ect Mult Living                  Review of Systems  Constitutional: Negative for fever.  Musculoskeletal:       See HPI.  Skin: Negative for wound.    Allergies  Atorvastatin and Rosuvastatin  Home Medications   Current Outpatient Rx  Name  Route  Sig  Dispense  Refill  . albuterol (PROVENTIL,VENTOLIN) 90 MCG/ACT inhaler   Inhalation   Inhale 2 puffs into the lungs every 4 (four) hours as needed. For wheezing         . amLODipine (NORVASC) 10 MG tablet   Oral   Take 1 tablet (10 mg total) by mouth daily.   30 tablet   1   . aspirin 81 MG tablet   Oral   Take 81 mg by mouth daily.         . carvedilol (COREG) 6.25 MG tablet   Oral  Take 1 tablet (6.25 mg total) by mouth 2 (two) times daily with a meal.   60 tablet   6   . ferrous sulfate 325 (65 FE) MG tablet   Oral   Take 1 tablet (325 mg total) by mouth 3 (three) times daily with meals.   90 tablet   6   . Fluticasone-Salmeterol (ADVAIR) 100-50 MCG/DOSE AEPB   Inhalation   Inhale 1 puff into the lungs 2 (two) times daily as needed (wheezing).          . furosemide (LASIX) 40 MG tablet   Oral   Take 40 mg by mouth as needed (excess fluid).         . hydrocortisone (ANUSOL-HC) 25 MG suppository   Rectal   Place 1 suppository (25 mg total) rectally 2 (two) times daily as needed.   20 suppository   6   . letrozole (FEMARA) 2.5 MG tablet   Oral   Take 1 tablet (2.5 mg total) by mouth daily.   30 tablet   12   . LORazepam (ATIVAN) 1 MG tablet   Oral   Take 1 tablet (1 mg total) by mouth daily as needed. For anxiety   30 tablet   5   . Multiple Vitamin (MULTIVITAMIN) tablet   Oral   Take 1 tablet by mouth daily.          Marland Kitchen olmesartan (BENICAR) 40 MG tablet   Oral   Take 40 mg by mouth daily.           . pantoprazole (PROTONIX) 40 MG tablet   Oral   Take 1  tablet (40 mg total) by mouth daily.   90 tablet   1   . polyethylene glycol (MIRALAX / GLYCOLAX) packet   Oral   Take 17 g by mouth daily as needed (constipation).         . simvastatin (ZOCOR) 40 MG tablet   Oral   Take 1 tablet (40 mg total) by mouth every evening.   90 tablet   3   . spironolactone (ALDACTONE) 25 MG tablet   Oral   Take 1 tablet (25 mg total) by mouth daily.   30 tablet   12   . traMADol (ULTRAM) 50 MG tablet   Oral   Take 1 tablet (50 mg total) by mouth every 6 (six) hours as needed. For pain   30 tablet   5     Triage Vitals: BP 149/76  Pulse 80  Temp(Src) 98.7 F (37.1 C) (Oral)  Resp 16  SpO2 98%  LMP 12/31/1968  Physical Exam  Nursing note and vitals reviewed. Constitutional: She is oriented to person, place, and time. She appears well-developed and well-nourished.  HENT:  Head: Normocephalic and atraumatic.  Eyes: EOM are normal. Pupils are equal, round, and reactive to light.  Neck: Normal range of motion. No tracheal deviation present.  Pulmonary/Chest: Effort normal. No respiratory distress.  Abdominal: Soft. There is no tenderness.  Musculoskeletal: Normal range of motion. She exhibits no tenderness.  Marked, bilateral foot swelling without redness, appears chronic. Right great toe unremarkable in appearance, nail intact. No evidence of infection. Tender along the medial nail bed. First MTP joint without redness or swelling.  Neurological: She is alert and oriented to person, place, and time.  Skin: Skin is warm. No rash noted.  Psychiatric: She has a normal mood and affect.    ED Course  Procedures (including critical care time)  DIAGNOSTIC  STUDIES: Oxygen Saturation is 98% on RA, normal by my interpretation.    COORDINATION OF CARE: 8:12 PM- Pt advised of plan for treatment and pt agrees.    Labs Reviewed - No data to display No results found.   No diagnosis found.  1. Paronychia of toe  MDM   Persistent/recurrent pain adjacent to great toe nail and no objective evidence to support infection, gout. Suspect ingrown toenail.          I personally performed the services described in this documentation, which was scribed in my presence. The recorded information has been reviewed and is accurate.     Dewaine Oats, PA-C 08/22/12 0019

## 2012-08-22 ENCOUNTER — Ambulatory Visit (INDEPENDENT_AMBULATORY_CARE_PROVIDER_SITE_OTHER): Payer: Medicare Other | Admitting: Internal Medicine

## 2012-08-22 ENCOUNTER — Encounter: Payer: Self-pay | Admitting: Internal Medicine

## 2012-08-22 VITALS — BP 154/88 | HR 83 | Temp 97.7°F | Resp 20 | Ht 67.0 in | Wt 239.5 lb

## 2012-08-22 DIAGNOSIS — N259 Disorder resulting from impaired renal tubular function, unspecified: Secondary | ICD-10-CM

## 2012-08-22 DIAGNOSIS — R7989 Other specified abnormal findings of blood chemistry: Secondary | ICD-10-CM

## 2012-08-22 DIAGNOSIS — N289 Disorder of kidney and ureter, unspecified: Secondary | ICD-10-CM

## 2012-08-22 DIAGNOSIS — R7309 Other abnormal glucose: Secondary | ICD-10-CM

## 2012-08-22 DIAGNOSIS — E785 Hyperlipidemia, unspecified: Secondary | ICD-10-CM

## 2012-08-22 DIAGNOSIS — R7303 Prediabetes: Secondary | ICD-10-CM

## 2012-08-22 DIAGNOSIS — I872 Venous insufficiency (chronic) (peripheral): Secondary | ICD-10-CM

## 2012-08-22 LAB — LIPID PANEL
Cholesterol: 188 mg/dL (ref 0–200)
HDL: 43 mg/dL (ref >39)
LDL Cholesterol: 89 mg/dL (ref 0–99)
Total CHOL/HDL Ratio: 4.4 Ratio
Triglycerides: 279 mg/dL — ABNORMAL HIGH (ref <150)
VLDL: 56 mg/dL — ABNORMAL HIGH (ref 0–40)

## 2012-08-22 LAB — BASIC METABOLIC PANEL WITH GFR
BUN: 19 mg/dL (ref 6–23)
CO2: 27 mEq/L (ref 19–32)
Calcium: 10.2 mg/dL (ref 8.4–10.5)
Chloride: 105 mEq/L (ref 96–112)
Creat: 1.23 mg/dL — ABNORMAL HIGH (ref 0.50–1.10)
GFR, Est African American: 54 mL/min — ABNORMAL LOW
GFR, Est Non African American: 47 mL/min — ABNORMAL LOW
Glucose, Bld: 110 mg/dL — ABNORMAL HIGH (ref 70–99)
Potassium: 4 mEq/L (ref 3.5–5.3)
Sodium: 143 mEq/L (ref 135–145)

## 2012-08-22 LAB — POCT GLYCOSYLATED HEMOGLOBIN (HGB A1C): Hemoglobin A1C: 6.4

## 2012-08-22 LAB — GLUCOSE, CAPILLARY: Glucose-Capillary: 113 mg/dL — ABNORMAL HIGH (ref 70–99)

## 2012-08-22 NOTE — Progress Notes (Signed)
Case discussed with Dr. Ziemer (at time of visit, soon after the resident saw the patient).  We reviewed the resident's history and exam and pertinent patient test results.  I agree with the assessment, diagnosis, and plan of care documented in the resident's note.  

## 2012-08-22 NOTE — Assessment & Plan Note (Signed)
A1c 6.4 today. Continued to encourage lifestyle modifications. Not candidate for metformin due to renal function.  - ophtho records requested, podiatry records requested

## 2012-08-22 NOTE — Assessment & Plan Note (Addendum)
Cr has been creeping up gradually, most recent 1.4. Unclear etiology. Not on NSAIDS. No sx. -Needs repeat Bmet today. Will also check Urine microalb:cr and renal U/S.  ADDENDUM 08/24/2012  BMET    Component Value Date/Time   NA 143 08/22/2012 1455   NA 143 04/25/2012 1045   K 4.0 08/22/2012 1455   K 3.8 04/25/2012 1045   CL 105 08/22/2012 1455   CL 103 04/25/2012 1045   CO2 27 08/22/2012 1455   CO2 27 04/25/2012 1045   GLUCOSE 110* 08/22/2012 1455   GLUCOSE 134* 04/25/2012 1045   BUN 19 08/22/2012 1455   BUN 20.8 04/25/2012 1045   CREATININE 1.23* 08/22/2012 1455   CREATININE 1.4* 04/25/2012 1045   CREATININE 1.23* 10/15/2011 1021   CALCIUM 10.2 08/22/2012 1455   CALCIUM 10.0 04/25/2012 1045   GFRNONAA 23* 06/30/2011 1716   GFRAA 27* 06/30/2011 1716   Lab Results  Component Value Date   MICROALBUR 1.33 08/22/2012    Renal function improving. Renal U/S scheduled. Called to communicate results to pt 08/24/2012 11:12AM.

## 2012-08-22 NOTE — Assessment & Plan Note (Addendum)
On simvastatin due to intolerance of atorvastatin and rosuvastatin.  Due for lipid panel today. - repeat lipid panel  Lab Results  Component Value Date   CHOL 188 08/22/2012   HDL 43 08/22/2012   LDLCALC 89 08/22/2012   TRIG 279* 08/22/2012   CHOLHDL 4.4 08/22/2012   Improving on simvastatin. Called to inform pt 08/24/2012 11:12AM, congratulated on progress, continue simvastatin

## 2012-08-22 NOTE — Patient Instructions (Addendum)
1. Increase lasix to 40mg  TWICE A DAY for the next week. Call if you do not have improvement of swelling. 2. I will call you back about your labs. 3. We will set up an appointment for a renal ultrasound.

## 2012-08-22 NOTE — Assessment & Plan Note (Addendum)
Has chronic venous insufficiency. Has not been wearing her compression stockings at home. Albumin normal, recent echo with only grade 1 DD. No evidence of acute decompensated HF on exam. - Increase lasix to 40mg  BID x 1 week, call if no improvement - repeat Bmet - recommended elevation, compliance w compression stockings  ADDENDUM 08/24/2012  Called pt, she is doing well and some improvement of swelling on BID lasix. Using compression stockings and elevation. Renal fxn + potassium look ok on Bmet.

## 2012-08-22 NOTE — Progress Notes (Signed)
Patient ID: [name], female   DOB: Feb 21, 1951, 62 y.o.   MRN: [id number]  Subjective:   Patient ID: [name] female   DOB: [birth year] 62 y.o.   MRN: [id number]  HPI: [name] is a 62 y.o. female with history of HTN, HLD, Breast Ca s/p double mastectomy presenting for ED follow up visit. She presented to the ED 08/17/12 for R great toe pain. She reported stubbing the toe in March and it has been painful since then.  Xrays negative for acute process. Referred to podiatry for acute paronychia and concern for ingrown toenail. Had podiatry appt this am and reports toenail cut out and complete foot exam. Records have been requested. Patient had HbA1c of 6.5 in February 2014. She has been told she has pre-diabetes and has been working on getting blood glucose under control w lifestyle modifications. Repeat A1c is 6.4 today. Has chronic LE edema, likely venous insufficiency that occasionally worsens. Reports increased swelling past couple of weeks. Takes furosemide when swelling increases, has taken it the past 1-2 weeks with some relief. No erythema or skin changes. Has compression stockings but hasn't been wearing them.  No worsening SOB, CP, palpitations.  Also w CKD, worsening Cr most recently 1.4. No etiology diagnosed yet. Reports she uses the bathroom frequently, denies hematuria, dysuria, flank pain.    Past Medical History  Diagnosis Date  . Depression     seasonal melancholia  . Hyperlipidemia   . Hypertension   . Myopathy     not statin related, cause unknown   . Back pain   . Edema extremities   . Chemotherapy follow-up examination 03/06/2011  . Cough 03/06/2011  . Arthritis     DJD, lumbar spondylosis  . Breast cancer     invasive ductal ca in remission as of 10/07/11 s/p double mastectomy  . Bronchitis 03/10/2012  . Asthma   . GERD (gastroesophageal reflux disease)    Current Outpatient Prescriptions  Medication Sig Dispense Refill  . albuterol  (PROVENTIL,VENTOLIN) 90 MCG/ACT inhaler Inhale 2 puffs into the lungs every 4 (four) hours as needed. For wheezing      . amLODipine (NORVASC) 10 MG tablet Take 1 tablet (10 mg total) by mouth daily.  30 tablet  1  . aspirin 81 MG tablet Take 81 mg by mouth daily.      . carvedilol (COREG) 6.25 MG tablet Take 1 tablet (6.25 mg total) by mouth 2 (two) times daily with a meal.  60 tablet  6  . ferrous sulfate 325 (65 FE) MG tablet Take 1 tablet (325 mg total) by mouth 3 (three) times daily with meals.  90 tablet  6  . Fluticasone-Salmeterol (ADVAIR) 100-50 MCG/DOSE AEPB Inhale 1 puff into the lungs 2 (two) times daily as needed (wheezing).       . furosemide (LASIX) 40 MG tablet Take 40 mg by mouth as needed (excess fluid).      Marland Kitchen HYDROcodone-acetaminophen (NORCO/VICODIN) 5-325 MG per tablet Take 1 tablet by mouth every 4 (four) hours as needed for pain.  6 tablet  0  . hydrocortisone (ANUSOL-HC) 25 MG suppository Place 1 suppository (25 mg total) rectally 2 (two) times daily as needed.  20 suppository  6  . letrozole (FEMARA) 2.5 MG tablet Take 1 tablet (2.5 mg total) by mouth daily.  30 tablet  12  . LORazepam (ATIVAN) 1 MG tablet Take 1 tablet (1 mg total) by mouth daily as needed. For anxiety  30  tablet  5  . Multiple Vitamin (MULTIVITAMIN) tablet Take 1 tablet by mouth daily.       Marland Kitchen olmesartan (BENICAR) 40 MG tablet Take 40 mg by mouth daily.        . pantoprazole (PROTONIX) 40 MG tablet Take 1 tablet (40 mg total) by mouth daily.  90 tablet  1  . polyethylene glycol (MIRALAX / GLYCOLAX) packet Take 17 g by mouth daily as needed (constipation).      . simvastatin (ZOCOR) 40 MG tablet Take 1 tablet (40 mg total) by mouth every evening.  90 tablet  3  . spironolactone (ALDACTONE) 25 MG tablet Take 1 tablet (25 mg total) by mouth daily.  30 tablet  12  . traMADol (ULTRAM) 50 MG tablet Take 1 tablet (50 mg total) by mouth every 6 (six) hours as needed. For pain  30 tablet  5   No current  facility-administered medications for this visit.   Facility-Administered Medications Ordered in Other Visits  Medication Dose Route Frequency Provider Last Rate Last Dose  . sodium chloride 0.9 % injection 10 mL  10 mL Intravenous PRN Deatra Robinson, MD   10 mL at 01/26/11 1035   Family History  Problem Relation Age of Onset  . Heart disease Mother 67    CAD  . Breast cancer Mother   . Cancer Mother 61    Breast cancer, mother  . Colon cancer Father   . Cancer Father 62    colon cancer dx'ed age 21  . Coronary artery disease Other   . Multiple sclerosis Daughter    History   Social History  . Marital Status: Single    Spouse Name: N/A    Number of Children: N/A  . Years of Education: N/A   Social History Main Topics  . Smoking status: Never Smoker   . Smokeless tobacco: Never Used  . Alcohol Use: No  . Drug Use: No  . Sexually Active: None   Other Topics Concern  . None   Social History Narrative   Single, 2 kids   Never smoked   No alcohol use   No drugs   Laid off from [employer] after working there for 14 years.    Review of Systems: 10 pt ROS performed, pertinent positives and negatives noted in HPI Objective:  Physical Exam: Filed Vitals:   08/22/12 1637  BP: 154/88  Pulse: 83  Temp: 97.7 F (36.5 C)  TempSrc: Oral  Resp: 20  Height: 5\' 7"  (1.702 m)  Weight: 239 lb 8 oz (108.636 kg)  SpO2: 100%   Constitutional: Vital signs reviewed.  Patient is an overweight female in no acute distress and cooperative with exam. Alert and oriented x3.  Head: Normocephalic and atraumatic Mouth: no erythema or exudates, MMM Eyes: PERRL, EOMI, conjunctivae normal Neck: Supple, No JVD appreciated Cardiovascular: RRR, S1 normal, S2 normal, no MRG, pulses symmetric and intact bilaterally Pulmonary/Chest: CTAB, no rales Abdominal: Slightly distended. Normoactive BS. No TTP.  GU: no CVA tenderness Extremities: R great toe bandaged. Bilateral 2+pedal edema  and 1+ edema up to mid shin. Calves symmetric without pain. No erythema or overlying skin changes. Warm. Neurological: A&O x3, Strength is normal and symmetric bilaterally, cranial nerve II-XII are grossly intact, no focal motor deficit, sensory intact to light touch bilaterally.  Skin: Warm, dry and intact. No rash, cyanosis, or clubbing.  Psychiatric: Normal mood and affect. Assessment & Plan:   Please see problem-based charting for assessment  and plan.

## 2012-08-23 LAB — MICROALBUMIN / CREATININE URINE RATIO
Creatinine, Urine: 120.3 mg/dL
Microalb Creat Ratio: 11.1 mg/g (ref 0.0–30.0)
Microalb, Ur: 1.33 mg/dL (ref 0.00–1.89)

## 2012-08-25 NOTE — ED Provider Notes (Signed)
Medical screening examination/treatment/procedure(s) were performed by non-physician practitioner and as supervising physician I was immediately available for consultation/collaboration.  Leota Jacobsen, MD 08/25/12 [phone]

## 2012-08-26 ENCOUNTER — Ambulatory Visit (HOSPITAL_COMMUNITY): Payer: Medicare Other

## 2012-08-26 ENCOUNTER — Other Ambulatory Visit: Payer: Self-pay | Admitting: *Deleted

## 2012-08-26 NOTE — Telephone Encounter (Signed)
Unable to reach pt to clarify what meds she needs.  Will wait for pharmacy fax.

## 2012-08-29 ENCOUNTER — Encounter: Payer: Self-pay | Admitting: Internal Medicine

## 2012-08-29 ENCOUNTER — Other Ambulatory Visit (HOSPITAL_COMMUNITY): Payer: Self-pay | Admitting: *Deleted

## 2012-08-29 ENCOUNTER — Ambulatory Visit (INDEPENDENT_AMBULATORY_CARE_PROVIDER_SITE_OTHER): Payer: Medicare Other | Admitting: Internal Medicine

## 2012-08-29 VITALS — BP 136/79 | HR 73 | Temp 97.1°F | Ht 68.0 in | Wt 237.7 lb

## 2012-08-29 DIAGNOSIS — R32 Unspecified urinary incontinence: Secondary | ICD-10-CM | POA: Insufficient documentation

## 2012-08-29 DIAGNOSIS — I872 Venous insufficiency (chronic) (peripheral): Secondary | ICD-10-CM

## 2012-08-29 MED ORDER — SPIRONOLACTONE 25 MG PO TABS: 25.0000 mg | ORAL_TABLET | Freq: Every day | ORAL | Status: DC

## 2012-08-29 NOTE — Assessment & Plan Note (Signed)
IMproved LE edema. Instructed to decrease lasix back to prn basis. Continue w elevation. Instructed to use compression stocking on L leg, can use on R leg in 1 week giving toenail adequate time to heal.  Call if worsening edema or develops SOB, CP, pressure, DOE.

## 2012-08-29 NOTE — Progress Notes (Signed)
Patient ID: [name], female   DOB: [birth year], 62 y.o.   MRN: [id number]  Subjective:   Patient ID: [name] female   DOB: 1950-12-28 62 y.o.   MRN: [id number]  HPI: [name] is a 62 y.o. female with history of HTN, HLD, Breast Ca s/p double mastectomy presenting for a follow up visit from last week. Has chronic LE edema, venous insufficiency that occasionally worsens for which she takes prn furosemide. At visit last week, reported increased swelling past couple of weeks. Had not been wearing compression hose. Instructed to take lasix 40mg  BID x1 week. Presents today for followup.  Reports some interval improvement in her bilateral lower extremity edema. She does not have any edema in her legs anymore, just in her feet. No chest pain or shortness of breath. Overall she says she's feeling better today but she is a little bit concerned about her leaking urine. She reports she had a sling procedure done 10 years ago and they told her she may eventually leak some again. Cannot identify any triggers for leakage. Wears a think pad. Has not tried kegel exercises.    Past Medical History  Diagnosis Date  . Depression     seasonal melancholia  . Hyperlipidemia   . Hypertension   . Myopathy     not statin related, cause unknown   . Back pain   . Edema extremities   . Chemotherapy follow-up examination 03/06/2011  . Cough 03/06/2011  . Arthritis     DJD, lumbar spondylosis  . Breast cancer     invasive ductal ca in remission as of 10/07/11 s/p double mastectomy  . Bronchitis 03/10/2012  . Asthma   . GERD (gastroesophageal reflux disease)    Current Outpatient Prescriptions  Medication Sig Dispense Refill  . albuterol (PROVENTIL,VENTOLIN) 90 MCG/ACT inhaler Inhale 2 puffs into the lungs every 4 (four) hours as needed. For wheezing      . amLODipine (NORVASC) 10 MG tablet Take 1 tablet (10 mg total) by mouth daily.  30 tablet  1  . aspirin 81 MG tablet Take 81 mg by  mouth daily.      . carvedilol (COREG) 6.25 MG tablet Take 1 tablet (6.25 mg total) by mouth 2 (two) times daily with a meal.  60 tablet  6  . ferrous sulfate 325 (65 FE) MG tablet Take 1 tablet (325 mg total) by mouth 3 (three) times daily with meals.  90 tablet  6  . Fluticasone-Salmeterol (ADVAIR) 100-50 MCG/DOSE AEPB Inhale 1 puff into the lungs 2 (two) times daily as needed (wheezing).       . furosemide (LASIX) 40 MG tablet Take 40 mg by mouth as needed (excess fluid).      Marland Kitchen HYDROcodone-acetaminophen (NORCO/VICODIN) 5-325 MG per tablet Take 1 tablet by mouth every 4 (four) hours as needed for pain.  6 tablet  0  . hydrocortisone (ANUSOL-HC) 25 MG suppository Place 1 suppository (25 mg total) rectally 2 (two) times daily as needed.  20 suppository  6  . letrozole (FEMARA) 2.5 MG tablet Take 1 tablet (2.5 mg total) by mouth daily.  30 tablet  12  . LORazepam (ATIVAN) 1 MG tablet Take 1 tablet (1 mg total) by mouth daily as needed. For anxiety  30 tablet  5  . Multiple Vitamin (MULTIVITAMIN) tablet Take 1 tablet by mouth daily.       Marland Kitchen olmesartan (BENICAR) 40 MG tablet Take 40 mg by mouth daily.        Marland Kitchen  pantoprazole (PROTONIX) 40 MG tablet Take 1 tablet (40 mg total) by mouth daily.  90 tablet  1  . polyethylene glycol (MIRALAX / GLYCOLAX) packet Take 17 g by mouth daily as needed (constipation).      . simvastatin (ZOCOR) 40 MG tablet Take 1 tablet (40 mg total) by mouth every evening.  90 tablet  3  . spironolactone (ALDACTONE) 25 MG tablet Take 1 tablet (25 mg total) by mouth daily.  30 tablet  12  . traMADol (ULTRAM) 50 MG tablet Take 1 tablet (50 mg total) by mouth every 6 (six) hours as needed. For pain  30 tablet  5   No current facility-administered medications for this visit.   Facility-Administered Medications Ordered in Other Visits  Medication Dose Route Frequency Provider Last Rate Last Dose  . sodium chloride 0.9 % injection 10 mL  10 mL Intravenous PRN Deatra Robinson, MD   10  mL at 01/26/11 1035   Family History  Problem Relation Age of Onset  . Heart disease Mother 35    CAD  . Breast cancer Mother   . Cancer Mother 25    Breast cancer, mother  . Colon cancer Father   . Cancer Father 66    colon cancer dx'ed age 38  . Coronary artery disease Other   . Multiple sclerosis Daughter    History   Social History  . Marital Status: Single    Spouse Name: N/A    Number of Children: N/A  . Years of Education: N/A   Social History Main Topics  . Smoking status: Never Smoker   . Smokeless tobacco: Never Used  . Alcohol Use: No  . Drug Use: No  . Sexually Active: Not on file   Other Topics Concern  . Not on file   Social History Narrative   Single, 2 kids   Never smoked   No alcohol use   No drugs   Laid off from [employer] after working there for 14 years.    Review of Systems: 10 pt ROS performed, pertinent positives and negatives noted in HPI Objective:  Physical Exam: Filed Vitals:   08/29/12 1319  BP: 136/79  Pulse: 73  Temp: 97.1 F (36.2 C)  TempSrc: Oral  Height: 5\' 8"  (1.727 m)  Weight: 237 lb 11.2 oz (107.82 kg)  SpO2: 99%   Constitutional: Vital signs reviewed. Patient is an overweight woman in no acute distress and cooperative with exam. Alert and oriented x3.  Head: Normocephalic and atraumatic  Mouth: no erythema or exudates, MMM  Eyes: PERRL, EOMI, conjunctivae normal  Neck: Supple, No JVD appreciated  Cardiovascular: RRR, S1 normal, S2 normal, no MRG, pulses symmetric and intact bilaterally  Pulmonary/Chest: CTAB, no rales  Abdominal: Slightly distended. Normoactive BS. No TTP.  GU: no CVA tenderness Extremities: R great toe healing well s/p partial toenail removal. Bilateral 2+pedal edema but no leg edema. Calves symmetric without pain. No erythema or overlying skin changes. Warm.  Neurological: A&O x3, Strength is normal and symmetric bilaterally, cranial nerve II-XII are grossly intact, no focal motor  deficit, sensory intact to light touch bilaterally.  Skin: Warm, dry and intact. No rash, cyanosis, or clubbing.  Psychiatric: Normal mood and affect.  Assessment & Plan:   Please see problem-based charting for assessment and plan.

## 2012-08-29 NOTE — Assessment & Plan Note (Addendum)
Has intermittent leaking, possibly combination stress/overflow. Likely worse over past week due to increased lasix. Prior sling procedure >10 years ago. No evidence of urinary retention on exam.  - Educated on kegel exercises, follow up in 1 mo - Renal US for CKD evaluation, doubt urinary retention

## 2012-08-29 NOTE — Patient Instructions (Signed)
1. You can start back wearing your compression stockings in 1 week. 2. Just use your lasix as needed now.  3. Come back in 1 month for follow up   Kegel Exercises The goal of Kegel exercises is to isolate and exercise your pelvic floor muscles. These muscles act as a hammock that supports the rectum, vagina, small intestine, and uterus. As the muscles weaken, the hammock sags and these organs are displaced from their normal positions. Kegel exercises can strengthen your pelvic floor muscles and help you to improve bladder and bowel control, improve sexual response, and help reduce many problems and some discomfort during pregnancy. Kegel exercises can be done anywhere and at any time. HOW TO PERFORM KEGEL EXERCISES 1. Locate your pelvic floor muscles. To do this, squeeze (contract) the muscles that you use when you try to stop the flow of urine. You will feel a tightness in the vaginal area (women) and a tight lift in the rectal area (men and women). 2. When you begin, contract your pelvic muscles tight for 2 5 seconds, then relax them for 2 5 seconds. This is one set. Do 4 5 sets with a short pause in between. 3. Contract your pelvic muscles for 8 10 seconds, then relax them for 8 10 seconds. Do 4 5 sets. If you cannot contract your pelvic muscles for 8 10 seconds, try 5 7 seconds and work your way up to 8 10 seconds. Your goal is 4 5 sets of 10 contractions each day. Keep your stomach, buttocks, and legs relaxed during the exercises. Perform sets of both short and long contractions. Vary your positions. Perform these contractions 3 4 times per day. Perform sets while you are:   Lying in bed in the morning.  Standing at lunch.  Sitting in the late afternoon.  Lying in bed at night. You should do 40 50 contractions per day. Do not perform more Kegel exercises per day than recommended. Overexercising can cause muscle fatigue. Continue these exercises for for at least 15 20 weeks or as directed  by your caregiver. Document Released: 02/24/2012 Document Reviewed: 12/03/2011 [facility] Patient Information 2014 [place], [place].

## 2012-08-30 ENCOUNTER — Telehealth: Payer: Self-pay | Admitting: *Deleted

## 2012-08-30 NOTE — Telephone Encounter (Signed)
Pt was called and notified of her Ultrasound on 09/02/2012 to arrive by 12:45 PM in Xray at [facility].  Sander Nephew, RN 08/30/2012 10:37 PM.

## 2012-08-30 NOTE — Telephone Encounter (Signed)
Call to pt to notify her of her appointment for ultrasound on 09/02/2012 at 1:00 PM to arrive by 12:45 AM.  Sander Nephew, RN 08/30/2012 10:34 AM.

## 2012-08-30 NOTE — Progress Notes (Signed)
Case discussed with Dr. Ziemer immediately after the resident saw the patient.  We reviewed the resident's history and exam and pertinent patient test results.  I agree with the assessment, diagnosis and plan of care documented in the resident's note. 

## 2012-09-02 ENCOUNTER — Ambulatory Visit (HOSPITAL_COMMUNITY)
Admission: RE | Admit: 2012-09-02 | Discharge: 2012-09-02 | Disposition: A | Discharge status: Home / Self Care | Payer: Medicare Other | Source: Ambulatory Visit | Attending: Internal Medicine | Admitting: Internal Medicine

## 2012-09-02 DIAGNOSIS — N289 Disorder of kidney and ureter, unspecified: Secondary | ICD-10-CM | POA: Insufficient documentation

## 2012-09-02 DIAGNOSIS — N281 Cyst of kidney, acquired: Secondary | ICD-10-CM | POA: Insufficient documentation

## 2012-09-06 ENCOUNTER — Telehealth: Payer: Self-pay | Admitting: Pulmonary Disease

## 2012-09-06 NOTE — Telephone Encounter (Signed)
I called and LMTCB on home # and cell # to see if pt stiil wants set-up. Elgin Bing, CMA

## 2012-09-07 NOTE — Telephone Encounter (Signed)
LM on home and mobile number

## 2012-09-07 NOTE — Telephone Encounter (Signed)
Pt state that she cannot afford the CPAP at this time and that she will have to wait until around August to have this started.  Pt call out office back around the time she is ready to start the process.   I have spoken with Melissa in regards to the pt wishes.  Will send to RA as FYI that pt is not starting CPAP at this time.

## 2012-09-13 ENCOUNTER — Telehealth: Payer: Self-pay | Admitting: *Deleted

## 2012-09-13 ENCOUNTER — Encounter: Payer: Self-pay | Admitting: Internal Medicine

## 2012-09-13 NOTE — Telephone Encounter (Signed)
Pt called about renal US results - Dr Stann Mainland states nothing to worry about and suggest FU appt. Pt decided to wait on letter of resident to whom she will be assign. Will call as soon as she gets her letter. Pt will call if any changes. Hilda Blades Lorraine Cimmino RN 09/13/12 2PM

## 2012-09-29 ENCOUNTER — Other Ambulatory Visit: Payer: Self-pay

## 2012-10-03 ENCOUNTER — Other Ambulatory Visit: Payer: Self-pay | Admitting: *Deleted

## 2012-10-03 MED ORDER — AMLODIPINE BESYLATE 10 MG PO TABS: 10.0000 mg | ORAL_TABLET | Freq: Every day | ORAL | Status: DC

## 2012-10-03 MED ORDER — TRAMADOL HCL 50 MG PO TABS: 50.0000 mg | ORAL_TABLET | Freq: Four times a day (QID) | ORAL | Status: DC | PRN

## 2012-10-03 MED ORDER — PANTOPRAZOLE SODIUM 40 MG PO TBEC: 40.0000 mg | DELAYED_RELEASE_TABLET | Freq: Every day | ORAL | Status: DC

## 2012-10-03 MED ORDER — OLMESARTAN MEDOXOMIL 40 MG PO TABS: 40.0000 mg | ORAL_TABLET | Freq: Every day | ORAL | Status: DC

## 2012-10-13 ENCOUNTER — Encounter: Payer: Self-pay | Admitting: Internal Medicine

## 2012-10-13 ENCOUNTER — Ambulatory Visit (INDEPENDENT_AMBULATORY_CARE_PROVIDER_SITE_OTHER): Payer: Medicare Other | Admitting: Internal Medicine

## 2012-10-13 VITALS — BP 132/80 | HR 69 | Temp 98.0°F | Ht 68.0 in | Wt 243.3 lb

## 2012-10-13 DIAGNOSIS — M549 Dorsalgia, unspecified: Secondary | ICD-10-CM

## 2012-10-13 DIAGNOSIS — M25561 Pain in right knee: Secondary | ICD-10-CM

## 2012-10-13 DIAGNOSIS — R32 Unspecified urinary incontinence: Secondary | ICD-10-CM

## 2012-10-13 DIAGNOSIS — R7309 Other abnormal glucose: Secondary | ICD-10-CM

## 2012-10-13 DIAGNOSIS — I519 Heart disease, unspecified: Secondary | ICD-10-CM

## 2012-10-13 DIAGNOSIS — R609 Edema, unspecified: Secondary | ICD-10-CM

## 2012-10-13 DIAGNOSIS — E785 Hyperlipidemia, unspecified: Secondary | ICD-10-CM

## 2012-10-13 DIAGNOSIS — G4733 Obstructive sleep apnea (adult) (pediatric): Secondary | ICD-10-CM

## 2012-10-13 DIAGNOSIS — M25569 Pain in unspecified knee: Secondary | ICD-10-CM

## 2012-10-13 DIAGNOSIS — I10 Essential (primary) hypertension: Secondary | ICD-10-CM

## 2012-10-13 DIAGNOSIS — R600 Localized edema: Secondary | ICD-10-CM

## 2012-10-13 DIAGNOSIS — R7303 Prediabetes: Secondary | ICD-10-CM

## 2012-10-13 DIAGNOSIS — K219 Gastro-esophageal reflux disease without esophagitis: Secondary | ICD-10-CM

## 2012-10-13 NOTE — Patient Instructions (Addendum)
General Instructions: Review information Follow up in 4-6 months, sooner if needed Wear TED hose and elevate your feet  Review information below  I will sent refills to your pharmacy if needed.     Treatment Goals:  Goals (1 Years of Data) as of 10/13/12         As of Today 08/29/12 08/22/12 08/17/12 08/17/12     Blood Pressure    . Blood Pressure < 140/90  132/80 136/79 154/88 149/88 149/76      Progress Toward Treatment Goals:  Treatment Goal 10/13/2012  Blood pressure at goal    Self Care Goals & Plans:  Self Care Goal 10/13/2012  Manage my medications take my medicines as prescribed; bring my medications to every visit; refill my medications on time  Monitor my health keep track of my weight; keep track of my blood pressure  Eat healthy foods drink diet soda or water instead of juice or soda; eat more vegetables; eat foods that are low in salt; eat baked foods instead of fried foods; eat fruit for snacks and desserts; eat smaller portions  Be physically active -  Meeting treatment goals maintain the current self-care plan       Care Management & Community Referrals:  Referral 10/13/2012  Referrals made for care management support none needed  Referrals made to community resources none       Exercise to Lose Weight Exercise and a healthy diet may help you lose weight. Your doctor may suggest specific exercises. EXERCISE IDEAS AND TIPS  Choose low-cost things you enjoy doing, such as walking, bicycling, or exercising to workout videos.  Take stairs instead of the elevator.  Walk during your lunch break.  Park your car further away from work or school.  Go to a gym or an exercise class.  Start with 5 to 10 minutes of exercise each day. Build up to 30 minutes of exercise 4 to 6 days a week.  Wear shoes with good support and comfortable clothes.  Stretch before and after working out.  Work out until you breathe harder and your heart beats faster.  Drink extra  water when you exercise.  Do not do so much that you hurt yourself, feel dizzy, or get very short of breath. Exercises that burn about 150 calories:  Running 1  miles in 15 minutes.  Playing volleyball for 45 to 60 minutes.  Washing and waxing a car for 45 to 60 minutes.  Playing touch football for 45 minutes.  Walking 1  miles in 35 minutes.  Pushing a stroller 1  miles in 30 minutes.  Playing basketball for 30 minutes.  Raking leaves for 30 minutes.  Bicycling 5 miles in 30 minutes.  Walking 2 miles in 30 minutes.  Dancing for 30 minutes.  Shoveling snow for 15 minutes.  Swimming laps for 20 minutes.  Walking up stairs for 15 minutes.  Bicycling 4 miles in 15 minutes.  Gardening for 30 to 45 minutes.  Jumping rope for 15 minutes.  Washing windows or floors for 45 to 60 minutes. Document Released: 04/11/2010 Document Revised: 06/01/2011 Document Reviewed: 04/11/2010 [facility] Patient Information 2014 [place], [place].  Bloating Bloating is the feeling of fullness in your belly. You may feel as though your pants are too tight. Often the cause of bloating is overeating, retaining fluids, or having gas in your bowel. It is also caused by swallowing air and eating foods that cause gas. Irritable bowel syndrome is one of the most common  causes of bloating. Constipation is also a common cause. Sometimes more serious problems can cause bloating. SYMPTOMS  Usually there is a feeling of fullness, as though your abdomen is bulged out. There may be mild discomfort.  DIAGNOSIS  Usually no particular testing is necessary for most bloating. If the condition persists and seems to become worse, your caregiver may do additional testing.  TREATMENT   There is no direct treatment for bloating.  Do not put gas into the bowel. Avoid chewing gum and sucking on candy. These tend to make you swallow air. Swallowing air can also be a nervous habit. Try to avoid this.  Avoiding high  residue diets will help. Eat foods with soluble fibers (examples include root vegetables, apples, or barley) and substitute dairy products with soy and rice products. This helps irritable bowel syndrome.  If constipation is the cause, then a high residue diet with more fiber will help.  Avoid carbonated beverages.  Over-the-counter preparations are available that help reduce gas. Your pharmacist can help you with this. SEEK MEDICAL CARE IF:   Bloating continues and seems to be getting worse.  You notice a weight gain.  You have a weight loss but the bloating is getting worse.  You have changes in your bowel habits or develop nausea or vomiting. SEEK IMMEDIATE MEDICAL CARE IF:   You develop shortness of breath or swelling in your legs.  You have an increase in abdominal pain or develop chest pain. Document Released: 01/07/2006 Document Revised: 06/01/2011 Document Reviewed: 02/25/2007 [facility] Patient Information 2014 [address].  Edema Edema is an abnormal build-up of fluids in tissues. Because this is partly dependent on gravity (water flows to the lowest place), it is more common in the legs and thighs (lower extremities). It is also common in the looser tissues, like around the eyes. Painless swelling of the feet and ankles is common and increases as a person ages. It may affect both legs and may include the calves or even thighs. When squeezed, the fluid may move out of the affected area and may leave a dent for a few moments. CAUSES   Prolonged standing or sitting in one place for extended periods of time. Movement helps pump tissue fluid into the veins, and absence of movement prevents this, resulting in edema.  Varicose veins. The valves in the veins do not work as well as they should. This causes fluid to leak into the tissues.  Fluid and salt overload.  Injury, burn, or surgery to the leg, ankle, or foot, may damage veins and allow fluid to leak out.  Sunburn damages  vessels. Leaky vessels allow fluid to go out into the sunburned tissues.  Allergies (from insect bites or stings, medications or chemicals) cause swelling by allowing vessels to become leaky.  Protein in the blood helps keep fluid in your vessels. Low protein, as in malnutrition, allows fluid to leak out.  Hormonal changes, including pregnancy and menstruation, cause fluid retention. This fluid may leak out of vessels and cause edema.  Medications that cause fluid retention. Examples are sex hormones, blood pressure medications, steroid treatment, or anti-depressants.  Some illnesses cause edema, especially heart failure, kidney disease, or liver disease.  Surgery that cuts veins or lymph nodes, such as surgery done for the heart or for breast cancer, may result in edema. DIAGNOSIS  Your caregiver is usually easily able to determine what is causing your swelling (edema) by simply asking what is wrong (getting a history) and examining you (  doing a physical). Sometimes x-rays, EKG (electrocardiogram or heart tracing), and blood work may be done to evaluate for underlying medical illness. TREATMENT  General treatment includes:  Leg elevation (or elevation of the affected body part).  Restriction of fluid intake.  Prevention of fluid overload.  Compression of the affected body part. Compression with elastic bandages or support stockings squeezes the tissues, preventing fluid from entering and forcing it back into the blood vessels.  Diuretics (also called water pills or fluid pills) pull fluid out of your body in the form of increased urination. These are effective in reducing the swelling, but can have side effects and must be used only under your caregiver's supervision. Diuretics are appropriate only for some types of edema. The specific treatment can be directed at any underlying causes discovered. Heart, liver, or kidney disease should be treated appropriately. HOME CARE INSTRUCTIONS    Elevate the legs (or affected body part) above the level of the heart, while lying down.  Avoid sitting or standing still for prolonged periods of time.  Avoid putting anything directly under the knees when lying down, and do not wear constricting clothing or garters on the upper legs.  Exercising the legs causes the fluid to work back into the veins and lymphatic channels. This may help the swelling go down.  The pressure applied by elastic bandages or support stockings can help reduce ankle swelling.  A low-salt diet may help reduce fluid retention and decrease the ankle swelling.  Take any medications exactly as prescribed. SEEK MEDICAL CARE IF:  Your edema is not responding to recommended treatments. SEEK IMMEDIATE MEDICAL CARE IF:   You develop shortness of breath or chest pain.  You cannot breathe when you lay down; or if, while lying down, you have to get up and go to the window to get your breath.  You are having increasing swelling without relief from treatment.  You develop a fever over 102 F (38.9 C).  You develop pain or redness in the areas that are swollen.  Tell your caregiver right away if you have gained 3 lb/1.4 kg in 1 day or 5 lb/2.3 kg in a week. MAKE SURE YOU:   Understand these instructions.  Will watch your condition.  Will get help right away if you are not doing well or get worse. Document Released: 03/09/2005 Document Revised: 09/08/2011 Document Reviewed: 10/26/2007 [facility] Patient Information 2014 [place].  DASH Diet The DASH diet stands for "Dietary Approaches to Stop Hypertension." It is a healthy eating plan that has been shown to reduce high blood pressure (hypertension) in as little as 14 days, while also possibly providing other significant health benefits. These other health benefits include reducing the risk of breast cancer after menopause and reducing the risk of type 2 diabetes, heart disease, colon cancer, and stroke.  Health benefits also include weight loss and slowing kidney failure in patients with chronic kidney disease.  DIET GUIDELINES  Limit salt (sodium). Your diet should contain less than 1500 mg of sodium daily.  Limit refined or processed carbohydrates. Your diet should include mostly whole grains. Desserts and added sugars should be used sparingly.  Include small amounts of heart-healthy fats. These types of fats include nuts, oils, and tub margarine. Limit saturated and trans fats. These fats have been shown to be harmful in the body. CHOOSING FOODS  The following food groups are based on a 2000 calorie diet. See your Registered Dietitian for individual calorie needs. Grains and Grain Products (  6 to 8 servings daily)  Eat More Often: Whole-wheat bread, brown rice, whole-grain or wheat pasta, quinoa, popcorn without added fat or salt (air popped).  Eat Less Often: White bread, white pasta, white rice, cornbread. Vegetables (4 to 5 servings daily)  Eat More Often: Fresh, frozen, and canned vegetables. Vegetables may be raw, steamed, roasted, or grilled with a minimal amount of fat.  Eat Less Often/Avoid: Creamed or fried vegetables. Vegetables in a cheese sauce. Fruit (4 to 5 servings daily)  Eat More Often: All fresh, canned (in natural juice), or frozen fruits. Dried fruits without added sugar. One hundred percent fruit juice ( cup [237 mL] daily).  Eat Less Often: Dried fruits with added sugar. Canned fruit in light or heavy syrup. [employer], Fish, and Poultry (2 servings or less daily. One serving is 3 to 4 oz [85-114 g]).  Eat More Often: Ninety percent or leaner ground beef, tenderloin, sirloin. Round cuts of beef, chicken breast, [place] breast. All fish. Grill, bake, or broil your meat. Nothing should be fried.  Eat Less Often/Avoid: Fatty cuts of meat, [place], or chicken leg, thigh, or wing. Fried cuts of meat or fish. Dairy (2 to 3 servings)  Eat More Often: Low-fat or  fat-free milk, low-fat plain or light yogurt, reduced-fat or part-skim cheese.  Eat Less Often/Avoid: Milk (whole, 2%).Whole milk yogurt. Full-fat cheeses. Nuts, Seeds, and Legumes (4 to 5 servings per week)  Eat More Often: All without added salt.  Eat Less Often/Avoid: Salted nuts and seeds, canned beans with added salt. Fats and Sweets (limited)  Eat More Often: Vegetable oils, tub margarines without trans fats, sugar-free gelatin. Mayonnaise and salad dressings.  Eat Less Often/Avoid: Coconut oils, palm oils, butter, stick margarine, cream, half and half, cookies, candy, pie. FOR MORE INFORMATION The Dash Diet Eating Plan: www.dashdiet.org Document Released: 02/26/2011 Document Revised: 06/01/2011 Document Reviewed: 02/26/2011 [facility] Patient Information 2014 [place], [place].  Hypertension As your heart beats, it forces blood through your arteries. This force is your blood pressure. If the pressure is too high, it is called hypertension (HTN) or high blood pressure. HTN is dangerous because you may have it and not know it. High blood pressure may mean that your heart has to work harder to pump blood. Your arteries may be narrow or stiff. The extra work puts you at risk for heart disease, stroke, and other problems.  Blood pressure consists of two numbers, a higher number over a lower, 110/72, for example. It is stated as "110 over 72." The ideal is below 120 for the top number (systolic) and under 80 for the bottom (diastolic). Write down your blood pressure today. You should pay close attention to your blood pressure if you have certain conditions such as:  Heart failure.  Prior heart attack.  Diabetes  Chronic kidney disease.  Prior stroke.  Multiple risk factors for heart disease. To see if you have HTN, your blood pressure should be measured while you are seated with your arm held at the level of the heart. It should be measured at least twice. A one-time elevated blood  pressure reading (especially in the Emergency Department) does not mean that you need treatment. There may be conditions in which the blood pressure is different between your right and left arms. It is important to see your caregiver soon for a recheck. Most people have essential hypertension which means that there is not a specific cause. This type of high blood pressure may be lowered by changing  lifestyle factors such as:  Stress.  Smoking.  Lack of exercise.  Excessive weight.  Drug/tobacco/alcohol use.  Eating less salt. Most people do not have symptoms from high blood pressure until it has caused damage to the body. Effective treatment can often prevent, delay or reduce that damage. TREATMENT  When a cause has been identified, treatment for high blood pressure is directed at the cause. There are a large number of medications to treat HTN. These fall into several categories, and your caregiver will help you select the medicines that are best for you. Medications may have side effects. You should review side effects with your caregiver. If your blood pressure stays high after you have made lifestyle changes or started on medicines,   Your medication(s) may need to be changed.  Other problems may need to be addressed.  Be certain you understand your prescriptions, and know how and when to take your medicine.  Be sure to follow up with your caregiver within the time frame advised (usually within two weeks) to have your blood pressure rechecked and to review your medications.  If you are taking more than one medicine to lower your blood pressure, make sure you know how and at what times they should be taken. Taking two medicines at the same time can result in blood pressure that is too low. SEEK IMMEDIATE MEDICAL CARE IF:  You develop a severe headache, blurred or changing vision, or confusion.  You have unusual weakness or numbness, or a faint feeling.  You have severe chest or  abdominal pain, vomiting, or breathing problems. MAKE SURE YOU:   Understand these instructions.  Will watch your condition.  Will get help right away if you are not doing well or get worse. Document Released: 03/09/2005 Document Revised: 06/01/2011 Document Reviewed: 10/28/2007 [facility] Patient Information 2014 [place].

## 2012-10-14 ENCOUNTER — Encounter: Payer: Self-pay | Admitting: Internal Medicine

## 2012-10-14 DIAGNOSIS — R600 Localized edema: Secondary | ICD-10-CM | POA: Insufficient documentation

## 2012-10-14 MED ORDER — SIMVASTATIN 40 MG PO TABS: 40.0000 mg | ORAL_TABLET | Freq: Every evening | ORAL | Status: DC

## 2012-10-14 MED ORDER — AMLODIPINE BESYLATE 10 MG PO TABS: 10.0000 mg | ORAL_TABLET | Freq: Every day | ORAL | Status: DC

## 2012-10-14 MED ORDER — OLMESARTAN MEDOXOMIL 40 MG PO TABS: 40.0000 mg | ORAL_TABLET | Freq: Every day | ORAL | Status: DC

## 2012-10-14 MED ORDER — PANTOPRAZOLE SODIUM 40 MG PO TBEC: 40.0000 mg | DELAYED_RELEASE_TABLET | Freq: Every day | ORAL | Status: DC

## 2012-10-14 MED ORDER — TRAMADOL HCL 50 MG PO TABS: 50.0000 mg | ORAL_TABLET | Freq: Four times a day (QID) | ORAL | Status: DC | PRN

## 2012-10-14 NOTE — Assessment & Plan Note (Signed)
Not using cpap due to affordability

## 2012-10-14 NOTE — Assessment & Plan Note (Signed)
Grade 1 diastolic dysfunction with 1-2+ lower extremity edema R>L Encouraged compliance with TED hose. Will keep Lasix 40 mg qd advised patient she can take it in the am to decreased freq of urination at night  May have to increase Lasix dose if compliance with TED hose not enough

## 2012-10-14 NOTE — Assessment & Plan Note (Signed)
Discussed diet and exercise modifications.

## 2012-10-14 NOTE — Assessment & Plan Note (Signed)
LDL at goal but TG elevated at 279 Given information of diet modifications.  Continue Zocor 40 mg qhs which was refilled

## 2012-10-14 NOTE — Assessment & Plan Note (Signed)
BP Readings from Last 3 Encounters:  10/13/12 132/80  08/29/12 136/79  08/22/12 154/88    Lab Results  Component Value Date   NA 143 08/22/2012   K 4.0 08/22/2012   CREATININE 1.23* 08/22/2012    Assessment: Blood pressure control: controlled Progress toward BP goal:  at goal Comments: improved and at goal  Plan: Medications:  continue current medications. Rx refill Norvasc, Benicar Educational resources provided: Designer, jewellery tools provided: home blood pressure logbook Other plans: none

## 2012-10-14 NOTE — Assessment & Plan Note (Addendum)
Pt signed Tramadol pain contract today but did not have frequency and refills.  Will do contract for Ultram 50 mg qid prn #90 RF x 3 patient is not really using them a lot but they help with pain Will have her resign the contract in 4-6 months with accurate information.

## 2012-10-14 NOTE — Assessment & Plan Note (Signed)
Chronic with hx of diastolic grade 1 dysfunction and venous insufficiency  Encouraged use of TED hose more frequently if this does not help increase Lasix 40 qd in the future

## 2012-10-14 NOTE — Assessment & Plan Note (Signed)
Rx refill of Protonix today  Dyspepsia maybe etiology of abdominal bloating

## 2012-10-14 NOTE — Progress Notes (Signed)
  Subjective:    Patient ID: [name], female    DOB: 1950-09-07, 62 y.o.   MRN: [id number]  HPI Comments: 62 y.o female very pleasant patient with PMH bilateral masectomy for breast cancer on Femara until 2017, prediabetes (HA1C 6.4 08/22/12), HLD (LDL 89 TG 279), HTN (BP 132/80 controlled), grade 1 diastolic dysfunction, chronic lower extremity edema, GERD, renal insufficiency, chronic back and knee pain, OSA not using cpap due to affordability, external hemmorhoids, cataracts.    She states her lower extremities are more swollen during the day and better at night with elevation and less activity.  She and her daughter admit she is not wearing TED hose regularly.    She needs Rx refills of Norvasc, Benicar, Protonix, and Zocor.    She sees specialist Dr. [name], Dr. Elsworth Soho, Dr. Humphrey Rolls, and Dr. Sung Amabile.  She will follow with Dr. Gershon Crane 05/2012 for eye appt and last saw him 06/2011.    SH: she has 2 kids one daughter that comes to office visits named Leana Roe     Review of Systems  Constitutional: Negative for fever and chills.  Respiratory: Negative for shortness of breath.   Cardiovascular: Positive for leg swelling. Negative for chest pain.  Gastrointestinal: Negative for abdominal pain, constipation and blood in stool.       +abdominal bloating with certain foods. Certain foods make her feel full  Genitourinary: Positive for frequency. Negative for dysuria and urgency.       Increased urine frequency due to taking Lasix at night  Neurological: Positive for numbness.       Numbness in toes intermittently       Objective:   Physical Exam  Nursing note and vitals reviewed. Constitutional: She is oriented to person, place, and time. Vital signs are normal. She appears well-developed and well-nourished. She is cooperative. No distress.  HENT:  Head: Normocephalic and atraumatic.  Mouth/Throat: Oropharynx is clear and moist and mucous membranes are normal. No oropharyngeal  exudate.  Eyes: Conjunctivae are normal. Pupils are equal, round, and reactive to light. Right eye exhibits no discharge. Left eye exhibits no discharge. No scleral icterus.  Cardiovascular: Normal rate, regular rhythm, S1 normal, S2 normal and normal heart sounds.   No murmur heard. 1-2+ lower extremity edema Right >left   Pulmonary/Chest: Effort normal and breath sounds normal. No respiratory distress. She has no wheezes.  Abdominal: Soft. Bowel sounds are normal. There is no tenderness.  Obese ab  Neurological: She is alert and oriented to person, place, and time. Gait normal.  Skin: Skin is warm, dry and intact. No rash noted. She is not diaphoretic.  Psychiatric: She has a normal mood and affect. Her speech is normal and behavior is normal. Judgment and thought content normal. Cognition and memory are normal.          Assessment & Plan:  Follow up in 4-6 months

## 2012-10-14 NOTE — Assessment & Plan Note (Signed)
Patient has h/o bladder repair for incontinence after pregnancy. She denies incontinence sx's today and states she has nocturia with taking Lasix at night. Advised her to take Lasix during the day

## 2012-10-17 NOTE — Progress Notes (Signed)
Case discussed with Dr. McLean at the time of the visit.  We reviewed the resident's history and exam and pertinent patient test results.  I agree with the assessment, diagnosis, and plan of care documented in the resident's note.     

## 2012-11-22 ENCOUNTER — Telehealth: Payer: Self-pay | Admitting: *Deleted

## 2012-11-22 NOTE — Telephone Encounter (Signed)
Patient called reporting history of double mastectomy in 2011.  "The left breast was the problem breast and I now am having swelling to the back of the incision and discomfort or pain in my chest near the incision.  Should I notify Dr. Humphrey Rolls or Dr. [name]?"  Instructed patient to contact Dr. [name] and gave the [facility] number with this call.  Will notify providers.

## 2012-11-23 ENCOUNTER — Other Ambulatory Visit: Payer: Self-pay | Admitting: Oncology

## 2012-11-23 ENCOUNTER — Encounter: Payer: Self-pay | Admitting: Internal Medicine

## 2012-11-23 ENCOUNTER — Ambulatory Visit: Payer: Medicare Other | Admitting: Internal Medicine

## 2012-11-23 ENCOUNTER — Emergency Department (HOSPITAL_COMMUNITY): Payer: Medicare Other

## 2012-11-23 ENCOUNTER — Emergency Department (HOSPITAL_COMMUNITY)
Admission: EM | Admit: 2012-11-23 | Discharge: 2012-11-23 | Disposition: A | Discharge status: Home / Self Care | Payer: Medicare Other | Attending: Emergency Medicine | Admitting: Emergency Medicine

## 2012-11-23 ENCOUNTER — Encounter (HOSPITAL_COMMUNITY): Payer: Self-pay | Admitting: Emergency Medicine

## 2012-11-23 DIAGNOSIS — Z853 Personal history of malignant neoplasm of breast: Secondary | ICD-10-CM | POA: Insufficient documentation

## 2012-11-23 DIAGNOSIS — Z79899 Other long term (current) drug therapy: Secondary | ICD-10-CM | POA: Insufficient documentation

## 2012-11-23 DIAGNOSIS — E785 Hyperlipidemia, unspecified: Secondary | ICD-10-CM | POA: Insufficient documentation

## 2012-11-23 DIAGNOSIS — M129 Arthropathy, unspecified: Secondary | ICD-10-CM | POA: Insufficient documentation

## 2012-11-23 DIAGNOSIS — K219 Gastro-esophageal reflux disease without esophagitis: Secondary | ICD-10-CM | POA: Insufficient documentation

## 2012-11-23 DIAGNOSIS — Z8669 Personal history of other diseases of the nervous system and sense organs: Secondary | ICD-10-CM | POA: Insufficient documentation

## 2012-11-23 DIAGNOSIS — J45909 Unspecified asthma, uncomplicated: Secondary | ICD-10-CM | POA: Insufficient documentation

## 2012-11-23 DIAGNOSIS — F329 Major depressive disorder, single episode, unspecified: Secondary | ICD-10-CM | POA: Insufficient documentation

## 2012-11-23 DIAGNOSIS — I10 Essential (primary) hypertension: Secondary | ICD-10-CM | POA: Insufficient documentation

## 2012-11-23 DIAGNOSIS — R0789 Other chest pain: Secondary | ICD-10-CM

## 2012-11-23 DIAGNOSIS — R071 Chest pain on breathing: Secondary | ICD-10-CM | POA: Insufficient documentation

## 2012-11-23 DIAGNOSIS — F3289 Other specified depressive episodes: Secondary | ICD-10-CM | POA: Insufficient documentation

## 2012-11-23 DIAGNOSIS — Z7982 Long term (current) use of aspirin: Secondary | ICD-10-CM | POA: Insufficient documentation

## 2012-11-23 LAB — BASIC METABOLIC PANEL
BUN: 23 mg/dL (ref 6–23)
CO2: 24 mEq/L (ref 19–32)
Calcium: 9.8 mg/dL (ref 8.4–10.5)
Chloride: 106 mEq/L (ref 96–112)
Creatinine, Ser: 1.43 mg/dL — ABNORMAL HIGH (ref 0.50–1.10)
GFR calc Af Amer: 44 mL/min — ABNORMAL LOW (ref >90)
GFR calc non Af Amer: 38 mL/min — ABNORMAL LOW (ref >90)
Glucose, Bld: 115 mg/dL — ABNORMAL HIGH (ref 70–99)
Potassium: 3.7 mEq/L (ref 3.5–5.1)
Sodium: 145 mEq/L (ref 135–145)

## 2012-11-23 LAB — CBC
HCT: 34.1 % — ABNORMAL LOW (ref 36.0–46.0)
Hemoglobin: 11.8 g/dL — ABNORMAL LOW (ref 12.0–15.0)
MCH: 29.2 pg (ref 26.0–34.0)
MCHC: 34.6 g/dL (ref 30.0–36.0)
MCV: 84.4 fL (ref 78.0–100.0)
Platelets: 229 10*3/uL (ref 150–400)
RBC: 4.04 MIL/uL (ref 3.87–5.11)
RDW: 13 % (ref 11.5–15.5)
WBC: 7.4 10*3/uL (ref 4.0–10.5)

## 2012-11-23 LAB — POCT I-STAT TROPONIN I: Troponin i, poc: 0.01 ng/mL (ref 0.00–0.08)

## 2012-11-23 MED ORDER — IBUPROFEN 200 MG PO TABS: 600.0000 mg | ORAL_TABLET | Freq: Once | ORAL | Status: DC

## 2012-11-23 NOTE — ED Notes (Signed)
Pt.reports intermittent left chest pain radiating to left arm onset yesterday , denies SOB , nausea or diaphoresis . Pt. Stated strenous activity / stressful day last Monday .

## 2012-11-23 NOTE — Progress Notes (Signed)
  Subjective:    Patient ID: [name], female    DOB: 07-May-1950, 62 y.o.   MRN: [id number]  HPI Comments: 62 y.o PMH chronic back pain, b/l knee pain, on chemo, chronic leg edema, chronic venous insufficiency, cough variant asthma, depression, diastolic dysfunction, external hemorrhoids, genital herpes, GERD, HLD, HTN, internal jugular vein thrombosis, OSA (not on cpap), prediabetes (6.4% 08/2012), CKD, left breast high grade invasive ductal carcinoma (grade 3-2.0 cm PT1C, PNO, PMX)) with lymphovascular invasion + high grade ductal carcinoma in situ with comedo-type necrosis.    She presents with acute swelling and pain left chest.      Review of Systems     Objective:   Physical Exam        Assessment & Plan:

## 2012-11-23 NOTE — ED Provider Notes (Signed)
CSN: [id number]     Arrival date & time 11/23/12  1951 History   First MD Initiated Contact with Patient 11/23/12 2005     Chief Complaint  Patient presents with  . Chest Pain   (Consider location/radiation/quality/duration/timing/severity/associated sxs/prior Treatment) Patient is a 62 y.o. female presenting with chest pain. The history is provided by the patient. No language interpreter was used.  Chest Pain Pain location:  L chest Pain quality: aching   Pain radiates to:  L arm Pain radiates to the back: no   Pain severity:  Mild Onset quality:  Gradual Duration:  3 days Timing:  Intermittent Progression:  Waxing and waning Chronicity:  New Context: lifting   Relieved by:  Nothing Worsened by:  Movement Ineffective treatments:  None tried Associated symptoms: no abdominal pain, no cough, no fatigue, no fever, no headache, no lower extremity edema, no nausea, no palpitations, no shortness of breath, not vomiting and no weakness   Risk factors: hypertension   Risk factors: no smoking     Past Medical History  Diagnosis Date  . Depression     seasonal melancholia  . Hyperlipidemia   . Hypertension   . Myopathy     not statin related, cause unknown   . Back pain   . Edema extremities   . Chemotherapy follow-up examination 03/06/2011  . Cough 03/06/2011  . Arthritis     DJD, lumbar spondylosis  . Bronchitis 03/10/2012  . Asthma   . GERD (gastroesophageal reflux disease)   . Cataract   . Breast cancer     left breast invasive ductal ca in remission as of 10/07/11 s/p double mastectomy   Past Surgical History  Procedure Laterality Date  . Abdominal hysterectomy      for fibroids   . Laminectomy      C5-6 Dr. Louanne Skye 2005   . Oophorectomy    . Mastectomy      bilateral   . Tonsillectomy    . Appendectomy    . Incontinence surgery    . Breast surgery  2011    Bilateral mastectomy   Family History  Problem Relation Age of Onset  . Heart disease Mother 74   CAD  . Breast cancer Mother   . Cancer Mother 37    Breast cancer, mother  . Colon cancer Father   . Cancer Father 80    colon cancer dx'ed age 20  . Coronary artery disease Other   . Multiple sclerosis Daughter    History  Substance Use Topics  . Smoking status: Never Smoker   . Smokeless tobacco: Never Used  . Alcohol Use: No   OB History   Grav Para Term Preterm Abortions TAB SAB Ect Mult Living                 Review of Systems  Constitutional: Negative for fever and fatigue.  HENT: Negative for congestion, sore throat and rhinorrhea.   Respiratory: Negative for cough and shortness of breath.   Cardiovascular: Positive for chest pain. Negative for palpitations.  Gastrointestinal: Negative for nausea, vomiting, abdominal pain and diarrhea.  Genitourinary: Negative for dysuria and hematuria.  Skin: Negative for rash.  Neurological: Negative for syncope, weakness, light-headedness and headaches.  All other systems reviewed and are negative.    Allergies  Atorvastatin and Rosuvastatin  Home Medications   Current Outpatient Rx  Name  Route  Sig  Dispense  Refill  . albuterol (PROVENTIL,VENTOLIN) 90 MCG/ACT inhaler   Inhalation  Inhale 2 puffs into the lungs every 4 (four) hours as needed. For wheezing         . amLODipine (NORVASC) 10 MG tablet   Oral   Take 1 tablet (10 mg total) by mouth daily.   30 tablet   5   . aspirin 81 MG tablet   Oral   Take 81 mg by mouth daily.         . carvedilol (COREG) 6.25 MG tablet   Oral   Take 1 tablet (6.25 mg total) by mouth 2 (two) times daily with a meal.   60 tablet   6   . ferrous sulfate 325 (65 FE) MG tablet   Oral   Take 1 tablet (325 mg total) by mouth 3 (three) times daily with meals.   90 tablet   6   . Fluticasone-Salmeterol (ADVAIR) 100-50 MCG/DOSE AEPB   Inhalation   Inhale 1 puff into the lungs 2 (two) times daily as needed (wheezing).          . furosemide (LASIX) 40 MG tablet   Oral    Take 40 mg by mouth as needed (excess fluid).         . hydrocortisone (ANUSOL-HC) 25 MG suppository   Rectal   Place 1 suppository (25 mg total) rectally 2 (two) times daily as needed.   20 suppository   6   . letrozole (FEMARA) 2.5 MG tablet   Oral   Take 1 tablet (2.5 mg total) by mouth daily.   30 tablet   12   . LORazepam (ATIVAN) 1 MG tablet   Oral   Take 1 tablet (1 mg total) by mouth daily as needed. For anxiety   30 tablet   5   . Multiple Vitamin (MULTIVITAMIN) tablet   Oral   Take 1 tablet by mouth daily.          Marland Kitchen olmesartan (BENICAR) 40 MG tablet   Oral   Take 1 tablet (40 mg total) by mouth daily.   30 tablet   5   . pantoprazole (PROTONIX) 40 MG tablet   Oral   Take 1 tablet (40 mg total) by mouth daily.   90 tablet   1   . polyethylene glycol (MIRALAX / GLYCOLAX) packet   Oral   Take 17 g by mouth daily as needed (constipation).         . simvastatin (ZOCOR) 40 MG tablet   Oral   Take 1 tablet (40 mg total) by mouth every evening.   90 tablet   1   . spironolactone (ALDACTONE) 25 MG tablet   Oral   Take 1 tablet (25 mg total) by mouth daily.   30 tablet   12   . traMADol (ULTRAM) 50 MG tablet   Oral   Take 1 tablet (50 mg total) by mouth every 6 (six) hours as needed. For pain   90 tablet   3    BP 161/84  Pulse 93  Temp(Src) 98.3 F (36.8 C) (Oral)  Resp 18  SpO2 98%  LMP 12/31/1968 Physical Exam  Nursing note and vitals reviewed. Constitutional: She is oriented to person, place, and time. She appears well-developed and well-nourished.  HENT:  Head: Normocephalic and atraumatic.  Right Ear: External ear normal.  Left Ear: External ear normal.  Eyes: EOM are normal. Pupils are equal, round, and reactive to light.  Neck: Normal range of motion. Neck supple.  Cardiovascular: Normal rate, regular rhythm  and intact distal pulses.  Exam reveals no gallop and no friction rub.   No murmur heard. Pulmonary/Chest: Effort  normal and breath sounds normal. No respiratory distress. She has no wheezes. She has no rales. She exhibits tenderness (left sided).  Abdominal: Soft. Bowel sounds are normal. She exhibits no distension. There is no tenderness. There is no rebound.  Musculoskeletal: Normal range of motion. She exhibits no edema and no tenderness.  Lymphadenopathy:    She has no cervical adenopathy.  Neurological: She is alert and oriented to person, place, and time.  Skin: Skin is warm. No rash noted.  Psychiatric: She has a normal mood and affect. Her behavior is normal.    ED Course  Procedures (including critical care time) Labs Review Labs Reviewed  CBC - Abnormal; Notable for the following:    Hemoglobin 11.8 (*)    HCT 34.1 (*)    All other components within normal limits  BASIC METABOLIC PANEL - Abnormal; Notable for the following:    Glucose, Bld 115 (*)    Creatinine, Ser 1.43 (*)    GFR calc non Af Amer 38 (*)    GFR calc Af Amer 44 (*)    All other components within normal limits  POCT I-STAT TROPONIN I   Imaging Review Dg Chest 2 View  11/23/2012   *RADIOLOGY REPORT*  Clinical Data: Chest pain  CHEST - 2 VIEW  Comparison: 09/02/2011  Findings: The lungs are clear without focal infiltrate, edema, pneumothorax or pleural effusion. The cardiopericardial silhouette is within normal limits for size. Imaged bony structures of the thorax are intact. Telemetry leads overlie the chest.  IMPRESSION: No acute cardiopulmonary findings.   Original Report Authenticated By: Misty Stanley, M.D.    MDM   1. Chest wall pain    8:21 PM Pt is a 62 y.o. female with pertinent PMHX of Depression, HTN, HLD, GERD, Breast cancer s/p masectomy who presents to the ED with chest pain. Chest pain for 3 days. Described as left side of chestwithradiation to left arm. No Associated shortness of breath, diaphoresis, nausea and vomiting. Denies pleuritic chest pain. Denies fevers, recent illness, cough, congestion, URI.  Denies personal history/family history of DVT/PE, denies recent immobilization, denies OCP use. Symptoms worse with nothing, relieved with nothing. No rash no trauma. Pt stated "I did a bad thing, by helping my brother move and worked for 5 hours lifting and straining" No immobilization, no pleuritic chest pain, low clinical suspicion for PE  Risk Factors: HTN, no previous CAD, no family history of premature CAD, no tobacco abuse, dyslipidemia  Previous Stress test several years ago. Previous Cath: none.  Primary Cardiologist: none.  On exam: AFVSS: reproducible chest pain on left side of chest. Plan for CBC, CMP, troponin, CXR and EKG.  EKG personally reviewed by myself showed NSR Rate of 92, PR 137ms, QRS 11ms QT/QTC 378/449ms, normal axis, without evidence of new ischemia. Comparison showed similar indication: chest pain. No evidence of acute pericarditis: no rubs, murmurs or gallops.  CXR PA/LAT per my read for chest pain showed no ptx, no rib fracture, no pneumonia.  Review of labs: troponin negative, BMP showed no electrolyte abnormalities, CBC showed no anemai or leukocytosis.  Heart Score: 3 safe for discharge, likely musculoskeletal chest pain. Good return precautions given. Recommended NSAIDS for acute pain relief   I have discussed the diagnosis/risks/treatment options with the patient and family and believe the pt to be eligible for discharge home to follow-up with PCP in  1week. We also discussed returning to the ED immediately if new or worsening sx occur. We discussed the sx which are most concerning (e.g., worsening chest pain, shortness of breath) that necessitate immediate return. Any new prescriptions provided to the patient are listed below.   Discharge Medication List as of 11/23/2012  9:41 PM      The patient appears reasonably screened and/or stabilized for discharge and I doubt any other medical condition or other [facility] requiring further screening, evaluation or treatment  in the ED at this time prior to discharge . Pt in agreement with discharge plan. Return precautions given. Pt discharged VSS  Labs, EKG and imaging reviewed by myself and considered in medical decision making if ordered.  Imaging interpreted by radiology. Pt was discussed with my attending, Dr. Langston Reusing, MD 11/23/12 [phone]

## 2012-11-23 NOTE — ED Notes (Signed)
Patient transported to X-ray 

## 2012-11-24 ENCOUNTER — Encounter (INDEPENDENT_AMBULATORY_CARE_PROVIDER_SITE_OTHER): Payer: Self-pay | Admitting: General Surgery

## 2012-11-24 ENCOUNTER — Telehealth: Payer: Self-pay | Admitting: *Deleted

## 2012-11-24 ENCOUNTER — Ambulatory Visit (INDEPENDENT_AMBULATORY_CARE_PROVIDER_SITE_OTHER): Payer: Medicare Other | Admitting: General Surgery

## 2012-11-24 VITALS — BP 124/90 | HR 78 | Temp 97.4°F | Resp 14 | Ht 68.0 in | Wt 233.8 lb

## 2012-11-24 DIAGNOSIS — C50919 Malignant neoplasm of unspecified site of unspecified female breast: Secondary | ICD-10-CM

## 2012-11-24 NOTE — Progress Notes (Signed)
Chief complaint: History of breast cancer With acute left chest wall pain  History: Patient comes to the office due to an episode of pain in her left chest wall. She has a history of T1 C N0 HER-2 positive ER/PR positive cancer of the left breast, Status post left total mastectomy with sentinel lymph node biopsy a prophylactic right mastectomy. She states that 3 days ago she was doing a lot of heavy lifting helping her brother move and the following day developed some very significant pain in her left anterior chest above her incision. She was actually seen in the emergency room yesterday. She was concerned and made this appointment. fortunately today she is feeling quite a bit better. Now only minimal discomfort. No noticeable swelling. Denies lump or skin changes.  Exam: BP 124/90  Pulse 78  Temp(Src) 97.4 F (36.3 C) (Temporal)  Resp 14  Ht 5\' 8"  (1.727 m)  Wt 233 lb 12.8 oz (106.051 kg)  BMI 35.56 kg/m2  LMP 12/31/1968 General: Appears well Lymph nodes: No cervical, subcuticular or axillary nodes palpable Lungs: Clear equal breath sounds bilaterally Chest wall: Well-healed mastectomy incisions bilaterally. No mass or swelling or tenderness or skin changes. No arm edema. Normal range of motion of her arms.  Assessment and plan: Doing well with no evidence of recurrent cancer. I suspect she had a muscle strain near her incision which is now significantly better. She will call if this does not go away completely or gets any worse. Otherwise we'll see her in routine followup in 6 months.

## 2012-11-24 NOTE — Telephone Encounter (Signed)
Lm  Informed the pt that kk would like to see her on 11/25/12 @ 8:15. gv appt for 11/25/12 @ 8:15am...td

## 2012-11-25 ENCOUNTER — Telehealth: Payer: Self-pay | Admitting: Oncology

## 2012-11-25 ENCOUNTER — Ambulatory Visit (HOSPITAL_BASED_OUTPATIENT_CLINIC_OR_DEPARTMENT_OTHER): Payer: Medicare Other | Admitting: Adult Health

## 2012-11-25 ENCOUNTER — Ambulatory Visit (HOSPITAL_BASED_OUTPATIENT_CLINIC_OR_DEPARTMENT_OTHER): Payer: Medicare Other | Admitting: Lab

## 2012-11-25 ENCOUNTER — Encounter: Payer: Self-pay | Admitting: Oncology

## 2012-11-25 VITALS — BP 164/82 | HR 61 | Temp 97.8°F | Resp 18 | Ht 68.0 in | Wt 239.1 lb

## 2012-11-25 DIAGNOSIS — C50919 Malignant neoplasm of unspecified site of unspecified female breast: Secondary | ICD-10-CM

## 2012-11-25 DIAGNOSIS — E559 Vitamin D deficiency, unspecified: Secondary | ICD-10-CM

## 2012-11-25 NOTE — Progress Notes (Addendum)
OFFICE PROGRESS NOTE  CC [name], MD [name], MD  Cresenciano Genre, MD [address] [place] [address]  DIAGNOSIS: 62 year old female with  #1 stage II A. ER/PR positive HER-2/neu positive invasive ductal carcinoma of the left breast diagnosed October 2011. Patient is status post bilateral mastectomies.  #2 status post right CVA.  #3 right internal jugular deep venous thrombosis secondary to her Port-A-Cath  #4 status post PICC line placement for ongoing Herceptin therapy.  PRIOR THERAPY:  #1 Status post bilateral mastectomy performed in October 2011  #2 The left breast: final pathology revealed infiltrating ductal carcinoma that was ER positive PR positive HER-2/neu positive. Right breast specimen only revealed benign breast tissue.  #3 patient received adjuvant chemotherapy consisting of weekly Taxotere carboplatinum and Herceptin for a total of 6 cycle.  #4 Patient has completed Herceptin on 04/17/2011.  #5 she has been on letrozole 2.5 mg starting June 2012.  CURRENT THERAPY: Letrozole 2.5 mg daily since June 2012  INTERVAL HISTORY: [name] 62 y.o. female returns today follow up visit. She continues to take Letrozole daily and is doing well.  She does endorse hot flashes, otherwise is tolerating the medication without difficulty.  She has dry eyes that she uses eye drops for.  She denies fevers, chills, night sweats, pain, or any further concerns.  A 10 point ROS is neg.  Her health maintenance was updated below.  She did she Dr. [name] previously, and on Monday 11/21/12 she did heavy cleaning and has some left upper chest pain and feels she over did it.  She went to the ER and a Leach up was negative.   MEDICAL HISTORY: Past Medical History  Diagnosis Date  . Depression     seasonal melancholia  . Hyperlipidemia   . Hypertension   . Myopathy     not statin related, cause unknown   . Back pain   . Edema extremities   .  Chemotherapy follow-up examination 03/06/2011  . Cough 03/06/2011  . Arthritis     DJD, lumbar spondylosis  . Bronchitis 03/10/2012  . Asthma   . GERD (gastroesophageal reflux disease)   . Cataract   . Breast cancer     left breast invasive ductal ca in remission as of 10/07/11 s/p double mastectomy    ALLERGIES:  is allergic to atorvastatin and rosuvastatin.  MEDICATIONS:  Current Outpatient Prescriptions  Medication Sig Dispense Refill  . albuterol (PROVENTIL,VENTOLIN) 90 MCG/ACT inhaler Inhale 2 puffs into the lungs every 4 (four) hours as needed. For wheezing      . amLODipine (NORVASC) 10 MG tablet Take 1 tablet (10 mg total) by mouth daily.  30 tablet  5  . aspirin 81 MG tablet Take 81 mg by mouth daily.      . carvedilol (COREG) 6.25 MG tablet Take 1 tablet (6.25 mg total) by mouth 2 (two) times daily with a meal.  60 tablet  6  . ferrous sulfate 325 (65 FE) MG tablet Take 1 tablet (325 mg total) by mouth 3 (three) times daily with meals.  90 tablet  6  . Fluticasone-Salmeterol (ADVAIR) 100-50 MCG/DOSE AEPB Inhale 1 puff into the lungs 2 (two) times daily as needed (wheezing).       . furosemide (LASIX) 40 MG tablet Take 40 mg by mouth as needed (excess fluid).      . hydrocortisone (ANUSOL-HC) 25 MG suppository Place 1 suppository (25 mg total) rectally 2 (two) times daily as needed.  20 suppository  6  . letrozole (FEMARA) 2.5 MG tablet Take 1 tablet (2.5 mg total) by mouth daily.  30 tablet  12  . LORazepam (ATIVAN) 1 MG tablet Take 1 tablet (1 mg total) by mouth daily as needed. For anxiety  30 tablet  5  . Multiple Vitamin (MULTIVITAMIN) tablet Take 1 tablet by mouth daily.       Marland Kitchen olmesartan (BENICAR) 40 MG tablet Take 1 tablet (40 mg total) by mouth daily.  30 tablet  5  . pantoprazole (PROTONIX) 40 MG tablet Take 1 tablet (40 mg total) by mouth daily.  90 tablet  1  . simvastatin (ZOCOR) 40 MG tablet Take 1 tablet (40 mg total) by mouth every evening.  90 tablet  1  .  spironolactone (ALDACTONE) 25 MG tablet Take 1 tablet (25 mg total) by mouth daily.  30 tablet  12  . traMADol (ULTRAM) 50 MG tablet Take 1 tablet (50 mg total) by mouth every 6 (six) hours as needed. For pain  90 tablet  3   No current facility-administered medications for this visit.   Facility-Administered Medications Ordered in Other Visits  Medication Dose Route Frequency Provider Last Rate Last Dose  . sodium chloride 0.9 % injection 10 mL  10 mL Intravenous PRN Deatra Robinson, MD   10 mL at 01/26/11 1035    SURGICAL HISTORY:  Past Surgical History  Procedure Laterality Date  . Abdominal hysterectomy      for fibroids   . Laminectomy      C5-6 Dr. Louanne Skye 2005   . Oophorectomy    . Mastectomy      bilateral   . Tonsillectomy    . Appendectomy    . Incontinence surgery    . Breast surgery  2011    Bilateral mastectomy    REVIEW OF SYSTEMS:  General: fatigue (-), night sweats (-), fever (-), pain (-) Lymph: palpable nodes (-) HEENT: vision changes (-), mucositis (-), gum bleeding (-), epistaxis (-) Cardiovascular: chest pain (-), palpitations (-) Pulmonary: shortness of breath (-), dyspnea on exertion (-), cough (-), hemoptysis (-) GI:  Early satiety (-), melena (-), dysphagia (-), nausea/vomiting (-), diarrhea (-) GU: dysuria (-), hematuria (-), incontinence (-) Musculoskeletal: joint swelling (-), joint pain (-), back pain (-) Neuro: weakness (-), numbness (-), headache (-), confusion (-) Skin: Rash (-), lesions (-), dryness (-) Psych: depression (-), suicidal/homicidal ideation (-), feeling of hopelessness (-)  Health Maintenance  Mammogram: n/a Colonoscopy: 05/2012 Bone Density Scan: 07/2011-normal Pap Smear: about 1 year ago Eye Exam: 05/2012 Vitamin D Level: 04/25/2012 Lipid Panel:  08/2012    PHYSICAL EXAMINATION:  BP 164/82  Pulse 61  Temp(Src) 97.8 F (36.6 C) (Oral)  Resp 18  Ht 5\' 8"  (1.727 m)  Wt 239 lb 1.6 oz (108.455 kg)  BMI 36.36 kg/m2  LMP  12/31/1968 General: Patient is a well appearing female in no acute distress HEENT: PERRLA, sclerae anicteric no conjunctival pallor, MMM Neck: supple, no palpable adenopathy Lungs: clear to auscultation bilaterally, no wheezes, rhonchi, or rales Cardiovascular: regular rate rhythm, S1, S2, no murmurs, rubs or gallops Abdomen: Soft, non-tender, non-distended, normoactive bowel sounds, no HSM Extremities: warm and well perfused, no clubbing, cyanosis, or edema Skin: No rashes or lesions Neuro: Non-focal Anterior chest wall bilateral mastectomy scars are well healed there is no evidence of local recurrence. ECOG PERFORMANCE STATUS: 0 - Asymptomatic  LABORATORY DATA: Lab Results  Component Value Date   WBC 7.4 11/23/2012  HGB 11.8* 11/23/2012   HCT 34.1* 11/23/2012   MCV 84.4 11/23/2012   PLT 229 11/23/2012      Chemistry      Component Value Date/Time   NA 145 11/23/2012 2005   NA 143 04/25/2012 1045   K 3.7 11/23/2012 2005   K 3.8 04/25/2012 1045   CL 106 11/23/2012 2005   CL 103 04/25/2012 1045   CO2 24 11/23/2012 2005   CO2 27 04/25/2012 1045   BUN 23 11/23/2012 2005   BUN 20.8 04/25/2012 1045   CREATININE 1.43* 11/23/2012 2005   CREATININE 1.23* 08/22/2012 1455   CREATININE 1.4* 04/25/2012 1045      Component Value Date/Time   CALCIUM 9.8 11/23/2012 2005   CALCIUM 10.0 04/25/2012 1045   ALKPHOS 91 04/25/2012 1045   ALKPHOS 81 10/15/2011 1021   AST 30 04/25/2012 1045   AST 24 10/15/2011 1021   ALT 42 04/25/2012 1045   ALT 33 10/15/2011 1021   BILITOT 0.74 04/25/2012 1045   BILITOT 0.4 10/15/2011 1021       RADIOGRAPHIC STUDIES:  Dg Chest 2 View  11/23/2012   *RADIOLOGY REPORT*  Clinical Data: Chest pain  CHEST - 2 VIEW  Comparison: 09/02/2011  Findings: The lungs are clear without focal infiltrate, edema, pneumothorax or pleural effusion. The cardiopericardial silhouette is within normal limits for size. Imaged bony structures of the thorax are intact. Telemetry leads overlie the chest.  IMPRESSION: No acute  cardiopulmonary findings.   Original Report Authenticated By: Misty Stanley, M.D.    ASSESSMENT: 62 year old female with:  1.  stage II a invasive ductal carcinoma of the left breast diagnosed in October 2011. She was found to have a ER/PR positive HER-2/neu positive breast cancer.She underwent bilateral mastectomies. Her right mastectomy was an elective prophylactic mastectomy due to her on comfort. Patient subsequently underwent adjuvant chemotherapy consisting of Taxotere carboplatinum and Herceptin for a total of 6 cycles. Thereafter she received Herceptin every 3 weeks and she has been on Letrozole 2.5 mg daily. Overall she is tolerating it well she has no evidence of recurrent disease.   PLAN:  #1 Doing well.  No sign of recurrence.  Continue Letrozole.  I recommended Vitamin D supplementation today.    #2 we will see you back in 6 months  All questions were answered. The patient knows to call the clinic with any problems, questions or concerns. We can certainly see the patient much sooner if necessary.  I spent 25 minutes counseling the patient face to face. The total time spent in the appointment was 30 minutes.  Suzan Garibaldi Sheppard Coil, [place] Phone: [phone]    11/25/2012, 8:42 AM    ATTENDING'S ATTESTATION:  I personally reviewed patient's chart, examined patient myself, formulated the treatment plan as followed.    Overall patient is doing well. She has no sign of recurrence. I recommended that she continue taking letrozole. We will continue to see her every 6 months. However she can call me sooner if need arises. Marcy Panning, MD Medical/Oncology [facility] [phone] (beeper) [phone] (Office)  12/11/2012, 10:50 PM

## 2012-11-25 NOTE — Patient Instructions (Signed)
Doing well.  No sign of recurrence.  Continue Letrozole.  Take Vitamin D 1000 IU daily.  We will see you back in 6 months.  Please call us if you have any questions or concerns.

## 2012-11-26 LAB — VITAMIN D 25 HYDROXY (VIT D DEFICIENCY, FRACTURES): Vit D, 25-Hydroxy: 42 ng/mL (ref 30–89)

## 2012-11-26 LAB — HEPATIC FUNCTION PANEL
ALT: 24 U/L (ref 0–35)
AST: 22 U/L (ref 0–37)
Albumin: 4.4 g/dL (ref 3.5–5.2)
Alkaline Phosphatase: 89 U/L (ref 39–117)
Bilirubin, Direct: 0.1 mg/dL (ref 0.0–0.3)
Indirect Bilirubin: 0.3 mg/dL (ref 0.0–0.9)
Total Bilirubin: 0.4 mg/dL (ref 0.3–1.2)
Total Protein: 6.9 g/dL (ref 6.0–8.3)

## 2012-11-26 NOTE — ED Provider Notes (Signed)
I saw and evaluated the patient, reviewed the resident's note and I agree with the findings and plan and agree with their ECG interpretation. Patient with chest pain. And lab work reassuring. Likely musculoskeletal due to recent physical activity. Will discharge home  Jasper Riling. Alvino Chapel, MD 11/26/12 [phone]

## 2012-12-01 ENCOUNTER — Encounter (HOSPITAL_COMMUNITY): Payer: Self-pay

## 2012-12-01 ENCOUNTER — Ambulatory Visit (HOSPITAL_COMMUNITY)
Admission: RE | Admit: 2012-12-01 | Discharge: 2012-12-01 | Disposition: A | Discharge status: Home / Self Care | Payer: Medicare Other | Source: Ambulatory Visit | Attending: Cardiology | Admitting: Cardiology

## 2012-12-01 VITALS — BP 128/76 | HR 81 | Wt 237.4 lb

## 2012-12-01 DIAGNOSIS — I872 Venous insufficiency (chronic) (peripheral): Secondary | ICD-10-CM | POA: Insufficient documentation

## 2012-12-01 DIAGNOSIS — R079 Chest pain, unspecified: Secondary | ICD-10-CM | POA: Insufficient documentation

## 2012-12-01 NOTE — Progress Notes (Signed)
HPI: [name] is a 62 y/o women with h/o obesity, HTN, asthma and HL. She also has history of TIA in April 2012.    Found to have L ductal breast CA. ER/PR +. HER 2 neu was equivocal. s/p bilateral mastectomies in 01/2010. Lymph nodes negative.   She completed herceptin therapy Jan. 2013.  Echo April 10, 2010 EF 55-60%. Grade 1 diastolic dusfunction. MUGA 2/12 EF 73%  Echo April 9,2012 EF 60-65% lat s' 10.43 (? Artifact) Echo 12/13/10 60-65% poor windows for lat s' peak I see is 8.09 ECHO 02/24/12 EF 55-60% lateral s' 6.5 ECHO 07/02/11 EF 55-60% lateral S' 6.9 RV mildly dilated but normal function Echo 05/30/2012: EF 60-65% lat s' 7.1, Grade 1 diastolic dysfunction September 2012 we changed HCTZ to lasix for longstanding LE edema (since her daughter was born in 76).   Follow up: Doing well. Reports that she has had some pain in her chest since Labor Day that she believes is related to helping her brother clean that day. She mopped the whole apt that day and was moving heavy bags. She went to the ED and EKG was normal along with Xray. Pain is getting better, reports that it comes and goes. Denies SOB, orthopnea, or PND. + LE edema.  She has chronic lower extremity edema that she wears compression hose for as well as prn lasix.    ROS: All systems negative except as listed in HPI, PMH and Problem List.  Past Medical History  Diagnosis Date  . Depression     seasonal melancholia  . Hyperlipidemia   . Hypertension   . Myopathy     not statin related, cause unknown   . Back pain   . Edema extremities   . Chemotherapy follow-up examination 03/06/2011  . Cough 03/06/2011  . Arthritis     DJD, lumbar spondylosis  . Bronchitis 03/10/2012  . Asthma   . GERD (gastroesophageal reflux disease)   . Cataract   . Breast cancer     left breast invasive ductal ca in remission as of 10/07/11 s/p double mastectomy   Current Outpatient Prescriptions on File Prior to Encounter  Medication Sig Dispense  Refill  . albuterol (PROVENTIL,VENTOLIN) 90 MCG/ACT inhaler Inhale 2 puffs into the lungs every 4 (four) hours as needed. For wheezing      . amLODipine (NORVASC) 10 MG tablet Take 1 tablet (10 mg total) by mouth daily.  30 tablet  5  . aspirin 81 MG tablet Take 81 mg by mouth daily.      . carvedilol (COREG) 6.25 MG tablet Take 1 tablet (6.25 mg total) by mouth 2 (two) times daily with a meal.  60 tablet  6  . ferrous sulfate 325 (65 FE) MG tablet Take 1 tablet (325 mg total) by mouth 3 (three) times daily with meals.  90 tablet  6  . Fluticasone-Salmeterol (ADVAIR) 100-50 MCG/DOSE AEPB Inhale 1 puff into the lungs 2 (two) times daily as needed (wheezing).       . furosemide (LASIX) 40 MG tablet Take 40 mg by mouth as needed (excess fluid).      . hydrocortisone (ANUSOL-HC) 25 MG suppository Place 1 suppository (25 mg total) rectally 2 (two) times daily as needed.  20 suppository  6  . letrozole (FEMARA) 2.5 MG tablet Take 1 tablet (2.5 mg total) by mouth daily.  30 tablet  12  . LORazepam (ATIVAN) 1 MG tablet Take 1 tablet (1 mg total) by mouth daily as needed.  For anxiety  30 tablet  5  . Multiple Vitamin (MULTIVITAMIN) tablet Take 1 tablet by mouth daily.       Marland Kitchen olmesartan (BENICAR) 40 MG tablet Take 1 tablet (40 mg total) by mouth daily.  30 tablet  5  . pantoprazole (PROTONIX) 40 MG tablet Take 1 tablet (40 mg total) by mouth daily.  90 tablet  1  . simvastatin (ZOCOR) 40 MG tablet Take 1 tablet (40 mg total) by mouth every evening.  90 tablet  1  . spironolactone (ALDACTONE) 25 MG tablet Take 1 tablet (25 mg total) by mouth daily.  30 tablet  12  . traMADol (ULTRAM) 50 MG tablet Take 1 tablet (50 mg total) by mouth every 6 (six) hours as needed. For pain  90 tablet  3   Current Facility-Administered Medications on File Prior to Encounter  Medication Dose Route Frequency Provider Last Rate Last Dose  . sodium chloride 0.9 % injection 10 mL  10 mL Intravenous PRN Deatra Robinson, MD   10 mL  at 01/26/11 1035    Filed Vitals:   12/01/12 1155  BP: 128/76  Pulse: 81  Weight: 237 lb 6.4 oz (107.684 kg)  SpO2: 98%   PHYSICAL EXAM: General:  Well appearing. No resp difficulty; daughter present HEENT: normal Neck: supple. JVP flat. Carotids 2+ bilaterally; no bruits. No lymphadenopathy or thryomegaly appreciated. Cor: PMI normal. Regular rate & rhythm. No rubs, murmurs; tender to palpate chest on R side Lungs: clear Abdomen: obese, soft, nontender, nondistended. No hepatosplenomegaly. No bruits or masses. Good bowel sounds. Extremities: no cyanosis, clubbing, rash. Tr-1+ pedal. Trace lower extremity edema Neuro: alert & orientedx3, cranial nerves grossly intact. Moves all 4 extremities w/o difficulty. Affect pleasant.  ASSESSMENT & PLAN:  1) CP - Patient called the office d/t CP. She was seen in the ED 11/23/12 for same CP and had nl EKG and Xray. She wanted to follow up with [place] just to be on safe side. Leading up to CP she was cleaning her brothers apt extensively moving heavy bags and mopping. After cleaning the apt CP started. She reports it comes and goes and it is tender to palpation. Discussed with the patient it is not likely r/t cardiac issues and likely musculoskeletal. Told to rest and see if it gets better. Told to call back if it does not improve and we can discuss getting a stress test.  Rande Brunt NP-C 1:24 PM  Patient seen with NP, agree with the above note.  Patient developed chest pain after doing strenuous upper body work.  Her left chest is clearly tender to palpation.  I think that she likely strained a muscle.  I do not think that she needs an ischemic workup at this point.   Loralie Champagne 12/01/2012

## 2012-12-06 ENCOUNTER — Telehealth: Payer: Self-pay | Admitting: Internal Medicine

## 2012-12-06 NOTE — Telephone Encounter (Signed)
Called to check on patient to see how she was doing after calling and being evaluated for chest pain. Chest pain is getting so much better.  She states on labor day she was cleaning her brothers house too much and then chest pain developed.   Aundra Dubin MD

## 2012-12-07 ENCOUNTER — Ambulatory Visit (INDEPENDENT_AMBULATORY_CARE_PROVIDER_SITE_OTHER): Payer: Medicare Other | Admitting: *Deleted

## 2012-12-07 DIAGNOSIS — Z23 Encounter for immunization: Secondary | ICD-10-CM

## 2012-12-14 ENCOUNTER — Encounter: Payer: Self-pay | Admitting: Internal Medicine

## 2012-12-14 ENCOUNTER — Ambulatory Visit (INDEPENDENT_AMBULATORY_CARE_PROVIDER_SITE_OTHER): Payer: Medicare Other | Admitting: Internal Medicine

## 2012-12-14 VITALS — BP 130/79 | HR 72 | Temp 98.0°F | Wt 237.7 lb

## 2012-12-14 DIAGNOSIS — R1013 Epigastric pain: Secondary | ICD-10-CM | POA: Insufficient documentation

## 2012-12-14 DIAGNOSIS — E785 Hyperlipidemia, unspecified: Secondary | ICD-10-CM

## 2012-12-14 DIAGNOSIS — M79609 Pain in unspecified limb: Secondary | ICD-10-CM

## 2012-12-14 DIAGNOSIS — M79673 Pain in unspecified foot: Secondary | ICD-10-CM

## 2012-12-14 LAB — COMPLETE METABOLIC PANEL WITH GFR
ALT: 22 U/L (ref 0–35)
AST: 20 U/L (ref 0–37)
Albumin: 4.4 g/dL (ref 3.5–5.2)
Alkaline Phosphatase: 76 U/L (ref 39–117)
BUN: 29 mg/dL — ABNORMAL HIGH (ref 6–23)
CO2: 27 mEq/L (ref 19–32)
Calcium: 10.2 mg/dL (ref 8.4–10.5)
Chloride: 102 mEq/L (ref 96–112)
Creat: 1.41 mg/dL — ABNORMAL HIGH (ref 0.50–1.10)
GFR, Est African American: 46 mL/min — ABNORMAL LOW
GFR, Est Non African American: 40 mL/min — ABNORMAL LOW
Glucose, Bld: 93 mg/dL (ref 70–99)
Potassium: 4.3 mEq/L (ref 3.5–5.3)
Sodium: 142 mEq/L (ref 135–145)
Total Bilirubin: 0.4 mg/dL (ref 0.3–1.2)
Total Protein: 7.4 g/dL (ref 6.0–8.3)

## 2012-12-14 LAB — LIPASE: Lipase: 103 U/L — ABNORMAL HIGH (ref 0–75)

## 2012-12-14 MED ORDER — SIMVASTATIN 20 MG PO TABS: 20.0000 mg | ORAL_TABLET | Freq: Every day | ORAL | Status: DC

## 2012-12-14 NOTE — Progress Notes (Signed)
Patient ID: [name], female   DOB: 01/16/1951, 62 y.o.   MRN: [id number]   Subjective:   Patient ID: [name] female   DOB: [birth year] 62 y.o.   MRN: [id number]  HPI: [name] is a 62 y.o. female with PMH of breast cancer, HLD, HTN, impaired fasting glucose.  She presents today after making appointment for 1 week history of epigastric abdominal pain with nausea.  She reports the pain was not associated with eating, and denies any reflux symptoms.  She reports her pain resolved yesterday and currently feels much better. In addition she reports she has had b/l heel pain and stiffness, she reports the pain is worst when she gets out of bed in the morning and is typically better later in the day. She reports this has been going on for a few months but she always forgets to bring it up at her visits.    Past Medical History  Diagnosis Date  . Depression     seasonal melancholia  . Hyperlipidemia   . Hypertension   . Myopathy     not statin related, cause unknown   . Back pain   . Edema extremities   . Chemotherapy follow-up examination 03/06/2011  . Cough 03/06/2011  . Arthritis     DJD, lumbar spondylosis  . Bronchitis 03/10/2012  . Asthma   . GERD (gastroesophageal reflux disease)   . Cataract   . Breast cancer     left breast invasive ductal ca in remission as of 10/07/11 s/p double mastectomy   Current Outpatient Prescriptions  Medication Sig Dispense Refill  . albuterol (PROVENTIL,VENTOLIN) 90 MCG/ACT inhaler Inhale 2 puffs into the lungs every 4 (four) hours as needed. For wheezing      . amLODipine (NORVASC) 10 MG tablet Take 1 tablet (10 mg total) by mouth daily.  30 tablet  5  . aspirin 81 MG tablet Take 81 mg by mouth daily.      . carvedilol (COREG) 6.25 MG tablet Take 1 tablet (6.25 mg total) by mouth 2 (two) times daily with a meal.  60 tablet  6  . cholecalciferol (VITAMIN D) 1000 UNITS tablet Take 1,000 Units by mouth daily.      . ferrous  sulfate 325 (65 FE) MG tablet Take 1 tablet (325 mg total) by mouth 3 (three) times daily with meals.  90 tablet  6  . Fluticasone-Salmeterol (ADVAIR) 100-50 MCG/DOSE AEPB Inhale 1 puff into the lungs 2 (two) times daily as needed (wheezing).       . furosemide (LASIX) 40 MG tablet Take 40 mg by mouth as needed (excess fluid).      . hydrocortisone (ANUSOL-HC) 25 MG suppository Place 1 suppository (25 mg total) rectally 2 (two) times daily as needed.  20 suppository  6  . letrozole (FEMARA) 2.5 MG tablet Take 1 tablet (2.5 mg total) by mouth daily.  30 tablet  12  . LORazepam (ATIVAN) 1 MG tablet Take 1 tablet (1 mg total) by mouth daily as needed. For anxiety  30 tablet  5  . Multiple Vitamin (MULTIVITAMIN) tablet Take 1 tablet by mouth daily.       Marland Kitchen olmesartan (BENICAR) 40 MG tablet Take 1 tablet (40 mg total) by mouth daily.  30 tablet  5  . pantoprazole (PROTONIX) 40 MG tablet Take 1 tablet (40 mg total) by mouth daily.  90 tablet  1  . simvastatin (ZOCOR) 20 MG tablet Take 1 tablet (20 mg  total) by mouth at bedtime.  90 tablet  1  . spironolactone (ALDACTONE) 25 MG tablet Take 1 tablet (25 mg total) by mouth daily.  30 tablet  12  . traMADol (ULTRAM) 50 MG tablet Take 1 tablet (50 mg total) by mouth every 6 (six) hours as needed. For pain  90 tablet  3   No current facility-administered medications for this visit.   Facility-Administered Medications Ordered in Other Visits  Medication Dose Route Frequency Provider Last Rate Last Dose  . sodium chloride 0.9 % injection 10 mL  10 mL Intravenous PRN Deatra Robinson, MD   10 mL at 01/26/11 1035   Family History  Problem Relation Age of Onset  . Heart disease Mother 53    CAD  . Breast cancer Mother   . Cancer Mother 79    Breast cancer, mother  . Colon cancer Father   . Cancer Father 23    colon cancer dx'ed age 32  . Coronary artery disease Other   . Multiple sclerosis Daughter    History   Social History  . Marital Status:  Single    Spouse Name: N/A    Number of Children: N/A  . Years of Education: N/A   Social History Main Topics  . Smoking status: Never Smoker   . Smokeless tobacco: Never Used  . Alcohol Use: No  . Drug Use: No  . Sexual Activity: None   Other Topics Concern  . None   Social History Narrative   Single, 2 kids   Never smoked   No alcohol use   No drugs   Laid off from [employer] after working there for 14 years.    Review of Systems: Review of Systems  Constitutional: Negative for fever, chills, weight loss and malaise/fatigue.  HENT: Negative for congestion.   Eyes: Negative for blurred vision.  Respiratory: Negative for cough, sputum production and shortness of breath.   Cardiovascular: Negative for chest pain, palpitations and leg swelling.  Gastrointestinal: Negative for heartburn, nausea, vomiting, abdominal pain, diarrhea and constipation.  Genitourinary: Negative for dysuria.  Musculoskeletal: Negative for myalgias and back pain.  Skin: Negative for rash.  Neurological: Negative for dizziness, sensory change and headaches.  Psychiatric/Behavioral: Negative for depression.    Objective:  Physical Exam: Filed Vitals:   12/14/12 1357  BP: 130/79  Pulse: 72  Temp: 98 F (36.7 C)  TempSrc: Oral  Weight: 237 lb 11.2 oz (107.82 kg)  SpO2: 99%  Physical Exam  Nursing note and vitals reviewed. Constitutional: She is well-developed, well-nourished, and in no distress. No distress.  HENT:  Head: Normocephalic and atraumatic.  Eyes: Conjunctivae are normal.  Cardiovascular: Normal rate, regular rhythm, normal heart sounds and intact distal pulses.   No murmur heard. Pulmonary/Chest: Effort normal and breath sounds normal. No respiratory distress. She has no wheezes. She has no rales.  Abdominal: Soft. Bowel sounds are normal. She exhibits no distension. There is no tenderness.  Musculoskeletal: She exhibits no edema.  Mild TTP of heel B/L.  No deformity.   Skin: Skin is warm and dry. She is not diaphoretic.  Psychiatric: Mood, affect and judgment normal.      Assessment & Plan:   See Problem Based Assessment and Plan Meds ordered this encounter  Medications  . simvastatin (ZOCOR) 20 MG tablet    Sig: Take 1 tablet (20 mg total) by mouth at bedtime.    Dispense:  90 tablet    Refill:  1

## 2012-12-14 NOTE — Assessment & Plan Note (Signed)
Patient has been doing well on Simvastain 40mg  QHS, however given that patient takes Amlodipine for blood pressure I will decrease simvastatin to 20mg  QHS per FDA recommendation.  Patient is intolerant to Atorvastatin and Rosuvastain.  I discussed this change with patients PCP, Dr. Aundra Dubin.  She will follow up patient's cholesterol at future visit.

## 2012-12-14 NOTE — Assessment & Plan Note (Signed)
Patient reports B/L Heel pain worse in AM.  No focal tenderness or abnormality on exam.  Likely Plantar Fasciitis, patient given printout of exercises to try conservative measures.  If pain worsens or fails to improve patient, provider may consider imagining at future visit. At this time patient was agreeable to plan.

## 2012-12-14 NOTE — Assessment & Plan Note (Signed)
Patient reported one week of epigastric pain with nausea. Resolved spontaneously yesterday.  Patient does have history of GERD but has been compliant with Protonix and denies reflux symptoms.  Will obtain CMP and Lipase as patient does have history of intolerance to Statins and is on simvastatin 40mg  with Amlodipine.  Patient given instructions to call back for same day or next day appointment if pain returns and if severe she should go to the ED.

## 2012-12-14 NOTE — Patient Instructions (Signed)
If your abdominal pain or nausea returns please call to schedule same day or next day appointment. Try the exercises to see if they help the heel pain.  Plantar Fasciitis Plantar fasciitis is a common condition that causes foot pain. It is soreness (inflammation) of the band of tough fibrous tissue on the bottom of the foot that runs from the heel bone (calcaneus) to the ball of the foot. The cause of this soreness may be from excessive standing, poor fitting shoes, running on hard surfaces, being overweight, having an abnormal walk, or overuse (this is common in runners) of the painful foot or feet. It is also common in aerobic exercise dancers and ballet dancers. SYMPTOMS  Most people with plantar fasciitis complain of:  Severe pain in the morning on the bottom of their foot especially when taking the first steps out of bed. This pain recedes after a few minutes of walking.  Severe pain is experienced also during walking following a long period of inactivity.  Pain is worse when walking barefoot or up stairs DIAGNOSIS   Your caregiver will diagnose this condition by examining and feeling your foot.  Special tests such as X-rays of your foot, are usually not needed. PREVENTION   Consult a sports medicine professional before beginning a new exercise program.  Walking programs offer a good workout. With walking there is a lower chance of overuse injuries common to runners. There is less impact and less jarring of the joints.  Begin all new exercise programs slowly. If problems or pain develop, decrease the amount of time or distance until you are at a comfortable level.  Wear good shoes and replace them regularly.  Stretch your foot and the heel cords at the back of the ankle (Achilles tendon) both before and after exercise.  Run or exercise on even surfaces that are not hard. For example, asphalt is better than pavement.  Do not run barefoot on hard surfaces.  If using a treadmill,  vary the incline.  Do not continue to workout if you have foot or joint problems. Seek professional help if they do not improve. HOME CARE INSTRUCTIONS   Avoid activities that cause you pain until you recover.  Use ice or cold packs on the problem or painful areas after working out.  Only take over-the-counter or prescription medicines for pain, discomfort, or fever as directed by your caregiver.  Soft shoe inserts or athletic shoes with air or gel sole cushions may be helpful.  If problems continue or become more severe, consult a sports medicine caregiver or your own health care provider. Cortisone is a potent anti-inflammatory medication that may be injected into the painful area. You can discuss this treatment with your caregiver. MAKE SURE YOU:   Understand these instructions.  Will watch your condition.  Will get help right away if you are not doing well or get worse. Document Released: 12/02/2000 Document Revised: 06/01/2011 Document Reviewed: 02/01/2008 [facility] Patient Information 2014 [place], [place].

## 2012-12-15 NOTE — Progress Notes (Signed)
I saw and evaluated the patient.  I personally confirmed the key portions of the history and exam documented by Dr. Hoffman and I reviewed pertinent patient test results.  The assessment, diagnosis, and plan were formulated together and I agree with the documentation in the resident's note. 

## 2012-12-16 ENCOUNTER — Ambulatory Visit (INDEPENDENT_AMBULATORY_CARE_PROVIDER_SITE_OTHER): Payer: Medicare Other | Admitting: General Surgery

## 2012-12-16 ENCOUNTER — Encounter (INDEPENDENT_AMBULATORY_CARE_PROVIDER_SITE_OTHER): Payer: Self-pay | Admitting: General Surgery

## 2012-12-16 VITALS — BP 140/80 | HR 84 | Temp 97.0°F | Resp 14 | Ht 68.0 in | Wt 236.6 lb

## 2012-12-16 DIAGNOSIS — C50919 Malignant neoplasm of unspecified site of unspecified female breast: Secondary | ICD-10-CM

## 2012-12-16 NOTE — Progress Notes (Signed)
Chief complaint: Followup mastectomy and cancer left breast  History: Patient returns for followup after she had an episode of pain in her left chest wall and feeling of some swelling. This has now entirely resolved. She feels well.  Exam: Both mastectomy wounds are well healed. There is no swelling or fluid or mass. She has full range of motion.  Assessment and plan: Doing well without apparent complications. Return for cancer followup in 6 months.

## 2012-12-20 ENCOUNTER — Encounter (INDEPENDENT_AMBULATORY_CARE_PROVIDER_SITE_OTHER): Payer: Self-pay

## 2012-12-22 ENCOUNTER — Telehealth: Payer: Self-pay | Admitting: Oncology

## 2012-12-22 ENCOUNTER — Ambulatory Visit: Payer: Self-pay | Admitting: Oncology

## 2012-12-22 ENCOUNTER — Ambulatory Visit (INDEPENDENT_AMBULATORY_CARE_PROVIDER_SITE_OTHER): Payer: Medicare Other | Admitting: Pulmonary Disease

## 2012-12-22 ENCOUNTER — Other Ambulatory Visit: Payer: Medicare Other | Admitting: Lab

## 2012-12-22 ENCOUNTER — Encounter: Payer: Self-pay | Admitting: Pulmonary Disease

## 2012-12-22 VITALS — BP 138/72 | HR 77 | Temp 99.2°F | Ht 68.0 in | Wt 240.8 lb

## 2012-12-22 DIAGNOSIS — J45991 Cough variant asthma: Secondary | ICD-10-CM

## 2012-12-22 DIAGNOSIS — G4733 Obstructive sleep apnea (adult) (pediatric): Secondary | ICD-10-CM

## 2012-12-22 NOTE — Telephone Encounter (Signed)
, °

## 2012-12-22 NOTE — Patient Instructions (Addendum)
Amlodipin could be worsening your feet swelling - discuss with PCP Schedule appt 1 mnth after starting CPAP Use Advair during winter

## 2012-12-22 NOTE — Progress Notes (Signed)
  Subjective:    Patient ID: [name], female    DOB: January 17, 1951, 62 y.o.   MRN: [id number]  HPI  60/F, never smoker, for FU of recurrent cough & wheezing  Attributed to cough variant asthma , sinuses & GERD  She reports wheezing when lying down especially in spring & fall for the last 3 yrs when she was given a diagnosis of asthma. She takes advair & claims compliance during this season. She is s/p chemotherapy for Breast cancer (Dr Humphrey Rolls) . Grade 1 diastolic dysfunction noted by Dr Haroldine Laws on echo. She reports pedal edema & has been taking lasix daily. She was on lisinopril for about 2 yrs - changed to olmesartan  Reviewed spirometry 9/09 wnl & 9/12 -no obstruction. 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 Pt stated her cough is much better. She might cough if tickle in throat or food hits her throat wrong. Pt reports breathing has been good. At times she has wheezing but then goes away.  Off advair since 1/ 2014  Review of Systems neg for any significant sore throat, dysphagia, itching, sneezing, nasal congestion or excess/ purulent secretions, fever, chills, sweats, unintended wt loss, pleuritic or exertional cp, hempoptysis, orthopnea pnd or change in chronic leg swelling. Also denies presyncope, palpitations, heartburn, abdominal pain, nausea, vomiting, diarrhea or change in bowel or urinary habits, dysuria,hematuria, rash, arthralgias, visual complaints, headache, numbness weakness or ataxia.     Objective:   Physical Exam  Gen. Pleasant, obese, in no distress ENT -  no lesions, no post nasal drip Neck: No JVD, no thyromegaly, no carotid bruits Lungs: no use of accessory muscles, no dullness to percussion, decreased without rales or rhonchi  Cardiovascular: Rhythm regular, heart sounds  normal, no murmurs or gallops, no peripheral edema Musculoskeletal: No deformities, no cyanosis or clubbing , no tremors       Assessment & Plan:

## 2012-12-23 NOTE — Assessment & Plan Note (Signed)
Take advair - during fall season Otherwise OK to stay off Flu shot

## 2012-12-23 NOTE — Assessment & Plan Note (Signed)
STart CPAP when able to afford Rx has been sent , med FF quattro mask  Weight loss encouraged, compliance with goal of at least 4-6 hrs every night is the expectation. Advised against medications with sedative side effects Cautioned against driving when sleepy - understanding that sleepiness will vary on a day to day basis

## 2012-12-27 ENCOUNTER — Other Ambulatory Visit: Payer: Self-pay | Admitting: *Deleted

## 2012-12-27 MED ORDER — CARVEDILOL 6.25 MG PO TABS: 6.2500 mg | ORAL_TABLET | Freq: Two times a day (BID) | ORAL | Status: DC

## 2013-01-19 ENCOUNTER — Ambulatory Visit (INDEPENDENT_AMBULATORY_CARE_PROVIDER_SITE_OTHER): Payer: Medicare Other | Admitting: Internal Medicine

## 2013-01-19 ENCOUNTER — Encounter: Payer: Self-pay | Admitting: Internal Medicine

## 2013-01-19 ENCOUNTER — Ambulatory Visit (HOSPITAL_COMMUNITY)
Admission: RE | Admit: 2013-01-19 | Discharge: 2013-01-19 | Disposition: A | Discharge status: Home / Self Care | Payer: Medicare Other | Source: Ambulatory Visit | Attending: Internal Medicine | Admitting: Internal Medicine

## 2013-01-19 VITALS — BP 134/78 | HR 72 | Temp 98.5°F | Ht 68.0 in | Wt 238.9 lb

## 2013-01-19 DIAGNOSIS — I10 Essential (primary) hypertension: Secondary | ICD-10-CM

## 2013-01-19 DIAGNOSIS — R590 Localized enlarged lymph nodes: Secondary | ICD-10-CM

## 2013-01-19 DIAGNOSIS — M79673 Pain in unspecified foot: Secondary | ICD-10-CM

## 2013-01-19 DIAGNOSIS — I8310 Varicose veins of unspecified lower extremity with inflammation: Secondary | ICD-10-CM

## 2013-01-19 DIAGNOSIS — I872 Venous insufficiency (chronic) (peripheral): Secondary | ICD-10-CM | POA: Insufficient documentation

## 2013-01-19 DIAGNOSIS — C50919 Malignant neoplasm of unspecified site of unspecified female breast: Secondary | ICD-10-CM

## 2013-01-19 DIAGNOSIS — R609 Edema, unspecified: Secondary | ICD-10-CM

## 2013-01-19 DIAGNOSIS — R0789 Other chest pain: Secondary | ICD-10-CM | POA: Insufficient documentation

## 2013-01-19 DIAGNOSIS — M79609 Pain in unspecified limb: Secondary | ICD-10-CM

## 2013-01-19 DIAGNOSIS — R600 Localized edema: Secondary | ICD-10-CM

## 2013-01-19 DIAGNOSIS — R079 Chest pain, unspecified: Secondary | ICD-10-CM

## 2013-01-19 MED ORDER — AMLODIPINE BESYLATE 10 MG PO TABS: 5.0000 mg | ORAL_TABLET | Freq: Every day | ORAL | Status: DC

## 2013-01-19 MED ORDER — TRIAMCINOLONE ACETONIDE 0.1 % EX CREA: TOPICAL_CREAM | Freq: Two times a day (BID) | CUTANEOUS | Status: DC | PRN

## 2013-01-19 NOTE — Patient Instructions (Addendum)
General Instructions: Sarna lotion for itching  Triamcinolone cream to lower legs as needed twice a day  Reduce Norvasc to 5 mg qd  Follow up with Dr. Humphrey Rolls 01/20/13 at 10 am to ask about the right chest soreness and lymph node under her left arm.  We may refer you to cardiology Velora Heckler) for your chest pain for further work up Follow up with Internal Medicine clinic in 1 week   Treatment Goals:  Goals (1 Years of Data) as of 01/19/13         01/19/13 12/22/12 12/16/12 12/14/12 12/01/12     Blood Pressure    . Blood Pressure < 140/90  134/78 138/72 140/80 130/79 128/76      Progress Toward Treatment Goals:  Treatment Goal 01/19/2013  Blood pressure at goal    Self Care Goals & Plans:  Self Care Goal 01/19/2013  Manage my medications take my medicines as prescribed; bring my medications to every visit; refill my medications on time  Monitor my health keep track of my blood pressure  Eat healthy foods drink diet soda or water instead of juice or soda; eat more vegetables; eat foods that are low in salt; eat baked foods instead of fried foods; eat fruit for snacks and desserts; eat smaller portions  Be physically active park at the far end of the parking lot  Meeting treatment goals maintain the current self-care plan    No flowsheet data found.   Care Management & Community Referrals:  Referral 01/19/2013  Referrals made for care management support none needed  Referrals made to community resources none         Stasis Dermatitis Stasis dermatitis occurs when veins lose the ability to pump blood back to the heart (poor venous circulation). It causes a reddish-purple to brownish scaly, itchy rash on the legs. The rash comes from pooling of blood (stasis). CAUSES  This occurs because the veins do not work very well anymore or because pressure may be increased in the veins due to other conditions. With blood pooling, the increased pressure in the tiny blood vessels  (capillaries) causes fluid to leak out of the capillaries into the tissue. The extra fluid makes it harder for the blood to feed the cells and get rid of waste products. SYMPTOMS  Stasis dermatitis appears as red, scaly, itchy patches on the legs. A yellowish or light brown discoloration is also present. Due to scratching or other injury, these patches can become an ulcer. This ulcer may remain for long periods of time. The ulcer can also become infected. Swelling of the legs is often present with stasis dermatitis. If the leg is swollen, this increases the risk of infection and further damage to the skin. Sometimes, intense itching, tingling, and burning occurs before signs of stasis dermatitis appear. You may find yourself scratching the insides of your ankles or rubbing your ankles together before the rash appears. After healing, there are often brown spots on the affected skin. DIAGNOSIS  Your caregiver makes this diagnosis based on an exam. Other tests may be done to better understand the cause. TREATMENT  If underlying conditions are present, they must be treated. Some of these conditions are heart failure, thyroid problems, poor nutrition, and varicose veins.  Cortisone creams and ointments applied to the skin (topically) may be needed, as well as medicine to reduce swelling in the legs (diuretics).  Compression stockings or an elastic wrap may also be needed to reduce swelling.  If there is an  infection, antibiotic medicines may also be used. HOME CARE INSTRUCTIONS   Try to rest and raise (elevate) the affected leg above the level of the heart, if possible.  Burow's solution wet packs applied for 30 minutes, 3 times daily, will help the weepy rash. Stop using the packs before your skin gets too dry. You can also use a mixture of 3 parts white vinegar to 1 quart water.  Grease your legs daily with ointments, such as petroleum jelly, to fight dryness.  Avoid scratching or injuring the  area. SEEK IMMEDIATE MEDICAL CARE IF:   Your rash gets worse.  An ulcer forms.  You have an oral temperature above 102 F (38.9 C), not controlled by medicine.  You have any other severe symptoms. Document Released: 06/18/2005 Document Revised: 06/01/2011 Document Reviewed: 07/29/2009 [facility] Patient Information 2014 [place], [place].    Triamcinolone skin cream, ointment, lotion, or aerosol What is this medicine? TRIAMCINOLONE (trye am SIN oh lone) is a corticosteroid. It is used on the skin to reduce swelling, redness, itching, and allergic reactions. This medicine may be used for other purposes; ask your health care provider or pharmacist if you have questions. What should I tell my health care provider before I take this medicine? They need to know if you have any of these conditions: -diabetes -infection, like tuberculosis, herpes, or fungal infection -large areas of burned or damaged skin -skin wasting or thinning -an unusual or allergic reaction to triamcinolone, corticosteroids, other medicines, foods, dyes, or preservatives -pregnant or trying to get pregnant -breast-feeding How should I use this medicine? This medicine is for external use only. Do not take by mouth. Follow the directions on the prescription label. Wash your hands before and after use. Apply a thin film of medicine to the affected area. Do not cover with a bandage or dressing unless your doctor or health care professional tells you to. Do not use on healthy skin or over large areas of skin. Do not get this medicine in your eyes. If you do, rinse out with plenty of cool tap water. It is important not to use more medicine than prescribed. Do not use your medicine more often than directed. Talk to your pediatrician regarding the use of this medicine in children. Special care may be needed. Elderly patients are more likely to have damaged skin through aging, and this may increase side effects. This medicine  should only be used for brief periods and infrequently in older patients. Overdosage: If you think you have taken too much of this medicine contact a poison control center or emergency room at once. NOTE: This medicine is only for you. Do not share this medicine with others. What if I miss a dose? If you miss a dose, use it as soon as you can. If it is almost time for your next dose, use only that dose. Do not use double or extra doses. What may interact with this medicine? Interactions are not expected. This list may not describe all possible interactions. Give your health care provider a list of all the medicines, herbs, non-prescription drugs, or dietary supplements you use. Also tell them if you smoke, drink alcohol, or use illegal drugs. Some items may interact with your medicine. What should I watch for while using this medicine? Tell your doctor or health care professional if your symptoms do not start to get better within one week. Do not use for more than 14 days. Do not use on healthy skin or over large areas  of skin. Tell your doctor or health care professional if you are exposed to anyone with measles or chickenpox, or if you develop sores or blisters that do not heal properly. Do not use an airtight bandage to cover the affected area unless your doctor or health care professional tells you to. If you are to cover the area, follow the instructions carefully. Covering the area where the medicine is applied can increase the amount that passes through the skin and increases the risk of side effects. If treating the diaper area of a child, avoid covering the treated area with tight-fitting diapers or plastic pants. This may increase the amount of medicine that passes through the skin and increase the risk of serious side effects. What side effects may I notice from receiving this medicine? Side effects that you should report to your doctor or health care professional as soon as  possible: -burning or itching of the skin -dark red spots on the skin -infection -painful, red, pus filled blisters in hair follicles -thinning of the skin, sunburn more likely especially on the face Side effects that usually do not require medical attention (report to your doctor or health care professional if they continue or are bothersome): -dry skin, irritation -unusual increased growth of hair on the face or body This list may not describe all possible side effects. Call your doctor for medical advice about side effects. You may report side effects to FDA at 1-800-FDA-1088. Where should I keep my medicine? Keep out of the reach of children. Store at room temperature between 15 and 30 degrees C (59 and 86 degrees F). Do not freeze. Throw away any unused medicine after the expiration date. NOTE: This sheet is a summary. It may not cover all possible information. If you have questions about this medicine, talk to your doctor, pharmacist, or health care provider.  2012, Elsevier/Gold Standard. (05/20/2007 4:15:53 PM)  Chest Pain (Nonspecific) It is often hard to give a specific diagnosis for the cause of chest pain. There is always a chance that your pain could be related to something serious, such as a heart attack or a blood clot in the lungs. You need to follow up with your caregiver for further evaluation. CAUSES   Heartburn.  Pneumonia or bronchitis.  Anxiety or stress.  Inflammation around your heart (pericarditis) or lung (pleuritis or pleurisy).  A blood clot in the lung.  A collapsed lung (pneumothorax). It can develop suddenly on its own (spontaneous pneumothorax) or from injury (trauma) to the chest.  Shingles infection (herpes zoster virus). The chest wall is composed of bones, muscles, and cartilage. Any of these can be the source of the pain.  The bones can be bruised by injury.  The muscles or cartilage can be strained by coughing or overwork.  The cartilage  can be affected by inflammation and become sore (costochondritis). DIAGNOSIS  Lab tests or other studies, such as X-rays, electrocardiography, stress testing, or cardiac imaging, may be needed to find the cause of your pain.  TREATMENT   Treatment depends on what may be causing your chest pain. Treatment may include:  Acid blockers for heartburn.  Anti-inflammatory medicine.  Pain medicine for inflammatory conditions.  Antibiotics if an infection is present.  You may be advised to change lifestyle habits. This includes stopping smoking and avoiding alcohol, caffeine, and chocolate.  You may be advised to keep your head raised (elevated) when sleeping. This reduces the chance of acid going backward from your stomach into your  esophagus.  Most of the time, nonspecific chest pain will improve within 2 to 3 days with rest and mild pain medicine. HOME CARE INSTRUCTIONS   If antibiotics were prescribed, take your antibiotics as directed. Finish them even if you start to feel better.  For the next few days, avoid physical activities that bring on chest pain. Continue physical activities as directed.  Do not smoke.  Avoid drinking alcohol.  Only take over-the-counter or prescription medicine for pain, discomfort, or fever as directed by your caregiver.  Follow your caregiver's suggestions for further testing if your chest pain does not go away.  Keep any follow-up appointments you made. If you do not go to an appointment, you could develop lasting (chronic) problems with pain. If there is any problem keeping an appointment, you must call to reschedule. SEEK MEDICAL CARE IF:   You think you are having problems from the medicine you are taking. Read your medicine instructions carefully.  Your chest pain does not go away, even after treatment.  You develop a rash with blisters on your chest. SEEK IMMEDIATE MEDICAL CARE IF:   You have increased chest pain or pain that spreads to your  arm, neck, jaw, back, or abdomen.  You develop shortness of breath, an increasing cough, or you are coughing up blood.  You have severe back or abdominal pain, feel nauseous, or vomit.  You develop severe weakness, fainting, or chills.  You have a fever. THIS IS AN EMERGENCY. Do not wait to see if the pain will go away. Get medical help at once. Call your local emergency services (911 in [place] Do not drive yourself to the hospital. MAKE SURE YOU:   Understand these instructions.  Will watch your condition.  Will get help right away if you are not doing well or get worse. Document Released: 12/17/2004 Document Revised: 06/01/2011 Document Reviewed: 10/13/2007 [facility] Patient Information 2014 [place].

## 2013-01-20 ENCOUNTER — Encounter: Payer: Self-pay | Admitting: Oncology

## 2013-01-20 ENCOUNTER — Other Ambulatory Visit (HOSPITAL_BASED_OUTPATIENT_CLINIC_OR_DEPARTMENT_OTHER): Payer: Medicare Other | Admitting: Lab

## 2013-01-20 ENCOUNTER — Ambulatory Visit (HOSPITAL_BASED_OUTPATIENT_CLINIC_OR_DEPARTMENT_OTHER): Payer: Medicare Other | Admitting: Oncology

## 2013-01-20 ENCOUNTER — Encounter: Payer: Self-pay | Admitting: Internal Medicine

## 2013-01-20 VITALS — BP 168/90 | HR 97 | Temp 98.2°F | Resp 20 | Ht 68.0 in | Wt 238.2 lb

## 2013-01-20 DIAGNOSIS — Z170 Estrogen receptor positive status [ER+]: Secondary | ICD-10-CM

## 2013-01-20 DIAGNOSIS — F419 Anxiety disorder, unspecified: Secondary | ICD-10-CM

## 2013-01-20 DIAGNOSIS — E559 Vitamin D deficiency, unspecified: Secondary | ICD-10-CM

## 2013-01-20 DIAGNOSIS — R079 Chest pain, unspecified: Secondary | ICD-10-CM

## 2013-01-20 DIAGNOSIS — M7989 Other specified soft tissue disorders: Secondary | ICD-10-CM

## 2013-01-20 DIAGNOSIS — F411 Generalized anxiety disorder: Secondary | ICD-10-CM

## 2013-01-20 DIAGNOSIS — C50919 Malignant neoplasm of unspecified site of unspecified female breast: Secondary | ICD-10-CM

## 2013-01-20 LAB — CBC WITH DIFFERENTIAL/PLATELET
BASO%: 1.2 % (ref 0.0–2.0)
Basophils Absolute: 0.1 10*3/uL (ref 0.0–0.1)
EOS%: 4.5 % (ref 0.0–7.0)
Eosinophils Absolute: 0.3 10*3/uL (ref 0.0–0.5)
HCT: 34.2 % — ABNORMAL LOW (ref 34.8–46.6)
HGB: 11.5 g/dL — ABNORMAL LOW (ref 11.6–15.9)
LYMPH%: 39.9 % (ref 14.0–49.7)
MCH: 28.7 pg (ref 25.1–34.0)
MCHC: 33.7 g/dL (ref 31.5–36.0)
MCV: 85.4 fL (ref 79.5–101.0)
MONO#: 0.4 10*3/uL (ref 0.1–0.9)
MONO%: 6.5 % (ref 0.0–14.0)
NEUT#: 3.2 10*3/uL (ref 1.5–6.5)
NEUT%: 47.9 % (ref 38.4–76.8)
Platelets: 247 10*3/uL (ref 145–400)
RBC: 4.01 10*6/uL (ref 3.70–5.45)
RDW: 13.8 % (ref 11.2–14.5)
WBC: 6.7 10*3/uL (ref 3.9–10.3)
lymph#: 2.7 10*3/uL (ref 0.9–3.3)

## 2013-01-20 LAB — COMPREHENSIVE METABOLIC PANEL (CC13)
ALT: 25 U/L (ref 0–55)
AST: 20 U/L (ref 5–34)
Albumin: 4.1 g/dL (ref 3.5–5.0)
Alkaline Phosphatase: 72 U/L (ref 40–150)
Anion Gap: 11 mEq/L (ref 3–11)
BUN: 19.8 mg/dL (ref 7.0–26.0)
CO2: 22 mEq/L (ref 22–29)
Calcium: 10.6 mg/dL — ABNORMAL HIGH (ref 8.4–10.4)
Chloride: 109 mEq/L (ref 98–109)
Creatinine: 1.3 mg/dL — ABNORMAL HIGH (ref 0.6–1.1)
Glucose: 134 mg/dl (ref 70–140)
Potassium: 4.6 mEq/L (ref 3.5–5.1)
Sodium: 143 mEq/L (ref 136–145)
Total Bilirubin: 0.66 mg/dL (ref 0.20–1.20)
Total Protein: 7.8 g/dL (ref 6.4–8.3)

## 2013-01-20 MED ORDER — VENLAFAXINE HCL ER 37.5 MG PO CP24: 37.5000 mg | ORAL_CAPSULE | Freq: Every day | ORAL | Status: DC

## 2013-01-20 MED ORDER — AMLODIPINE BESYLATE 5 MG PO TABS: 5.0000 mg | ORAL_TABLET | Freq: Every day | ORAL | Status: DC

## 2013-01-20 NOTE — Progress Notes (Signed)
Subjective:    Patient ID: [name], female    DOB: 10/09/1950, 62 y.o.   MRN: [id number]  HPI Comments: Pt seen in [facility] 01/19/13.  62 y.o PMH Depre, Hyperlipidemia (LDL 89 08/2012), Hypertension (controlled BP 134/78)), myopathy cause unknown, chronic Back pain 2/2 DJD, lumbar spondylosis, chronic knee pain, chronic lower extremity edema, on chemo s/p b/l masectomy for left breast cancer (has H/O appt with Dr. Humphrey Rolls 10/31 at 10 am,  Bronchitis, Asthma, GERD, Cataract, hemorrhoids, grade 1 diastolic dysfunction (EF 0000000; has followed with Dr. Sung Amabile in the past), OSA (not of cpap yet due to affordability).  She presents for left upper chest pain intermittent since Labor Day.  She followed with Dr. Loralie Champagne ([facility]) who at that time though chest pain was MSK.  She was also seen in the ED 11/23/12 for chest pain with 1 negative POC troponin.  Chest pain has still been intermittent lasting at times for 1 hour.  Patient states she had the chest pain this am (10/30), yesterday, the day before yesterday.  She tried Tramadol w/ some relief.  Applying pressure to her left chest lets up the pain she refers to as a "strain" a little but not completely.  Pain is not present at this time but when present does move to her left shoulder and down her left arm but is mostly localized in her left chest.  She reports having a stress test prior to 2011.  Of note, she has been doing adduction and flexion extension exercises with her arms.    She also still c/o b/l heel pain (previously noted at an [facility] appt).  Pain is worse with walking and at night.  Her toes go numb intermittently.  She tried to keep her legs elevated when she is resting at home.  This heel pain has affected her gait and she has to walk slower.  She has not tried OTC Tylenol and was instructed not to take OTC Advil.    She c/o of mobile mass in her left upper arm which is now in her left axilla.    She mentions her leg swelling is not  improved with wearing compression stockings and is concerned about Norvasc side effect of leg swelling mentioned to her by another physician.    She has itching to her b/l lower extremities.  She has not tried anything as of yet.    HM: up to date on flu shot                                                        Review of Systems  Cardiovascular: Positive for chest pain.  Gastrointestinal: Negative for nausea, vomiting and abdominal pain.       Objective:   Physical Exam  Nursing note and vitals reviewed. Constitutional: She is oriented to person, place, and time. Vital signs are normal. She appears well-developed and well-nourished. She is cooperative. No distress.  HENT:  Head: Normocephalic and atraumatic.  Mouth/Throat: No oropharyngeal exudate.  Eyes: Conjunctivae are normal. Right eye exhibits no discharge. Left eye exhibits no discharge. No scleral icterus.  Cardiovascular: Normal rate, regular rhythm, S1 normal, S2 normal and normal heart sounds.   No murmur heard. 2+ edema lower extremities   Pulmonary/Chest: Effort normal and breath sounds normal. No respiratory distress.  She has no wheezes.  B/l masectomy scars with palpation proximal and medial to right scar with ttp  Initial exam performed by resident with no ttp left chest   Attending performed exam with ttp left chest   Abdominal: Soft. Bowel sounds are normal. There is no tenderness.  Musculoskeletal:  No ttp b/l achilles tendons or ttp with plantar or dorsiflexion  Lymphadenopathy:    She has axillary adenopathy.  Left mid axilla with small palpable possible lymph node  Neurological: She is alert and oriented to person, place, and time. Gait abnormal.  Limping with walking  Skin: Skin is warm and dry. She is not diaphoretic.  Excoriations to lower extremities b/l with early stasis dermatitis changes to legs b/l  Psychiatric: She has a normal mood and affect. Her speech is normal and behavior is normal.  Judgment and thought content normal. Cognition and memory are normal.          Assessment & Plan:  Will make appt with [facility] and speak with Dr. Humphrey Rolls (H/O) regarding pt care.  Will refer to [facility] for b/l heel pain

## 2013-01-20 NOTE — Progress Notes (Signed)
I saw and evaluated the patient.  I personally confirmed the key portions of the history and exam documented by Dr. McLean and I reviewed pertinent patient test results.  The assessment, diagnosis, and plan were formulated together and I agree with the documentation in the resident's note.    

## 2013-01-20 NOTE — Assessment & Plan Note (Signed)
Hx breast cancer in remission on femara Pt has possible palpable left axillary lymph node and ttp right chest (see Physical exam) Will disc with Dr. Humphrey Rolls pt has appt 10/31 to see if she may need further imaging for w/u [place] or CT.  MAy want to disc with radiology as well for rec. On imaging to make sure pt does not have reoccurrence.

## 2013-01-20 NOTE — Assessment & Plan Note (Signed)
Chest pain seems atypical not exertional.  On one exam today was tender to palpation (ttP) reproducible, somewhat relieved by Ultram.  Etiology unclear at this time not likely ACS, ?MSK. EKG with NSR rate 66, nl axis, nl intervals, no LVH or ST changes.  Mild T wave inversion in V2 and T wave flattening in V3  Since chest pain has persisted will refer to [facility] cards for further w/u  She has had stress test in the past prior to 2011.   Consider stress test.  Will try to get oupt LB cards appt soon If chest pain worse go to the ED

## 2013-01-20 NOTE — Assessment & Plan Note (Signed)
Skin changes such as pruritis, excoriations, mild hyperpig Disc Sarna lotion also Rx TMC 0.1 %Cream bid prn to legs (not face, axilla, or groin Continue compression stockings

## 2013-01-20 NOTE — Patient Instructions (Signed)
We will order a bone scan  Effexor for anxiety  Referral to physical therapy for right upper extremity swelling  I will see back in 6 months time

## 2013-01-20 NOTE — Assessment & Plan Note (Addendum)
May be side effect of Norvasc.  Decreased 10 mg qd to 5 mg qd  Continue compression stockings May have to increase lasix will as for Cardiology input on Lasix and other meds

## 2013-01-20 NOTE — Assessment & Plan Note (Signed)
Most common cause is plantar fascitis in adults vs achilles tendonitis vs bursitis vs calcaneal stress fracture vs tarsal tunnel syndrome (which may be likely as she is having numbness)  Consider imaging (i.e Xray) for further w/u but will refer to Sports Medicine Advised use of Tramadol or Tylenol for now she is unable to take Advil

## 2013-01-20 NOTE — Assessment & Plan Note (Signed)
BP Readings from Last 3 Encounters:  01/19/13 134/78  12/22/12 138/72  12/16/12 140/80    Lab Results  Component Value Date   NA 142 12/14/2012   K 4.3 12/14/2012   CREATININE 1.41* 12/14/2012    Assessment: Blood pressure control: controlled Progress toward BP goal:  at goal Comments: none  Plan: Medications:  continue current medications except decreased Norvasc 10 mg to 5 mg to see if will help with leg edema, continue Coreg 6.25 mg bid, Lasix 40 mg qd, Benicar 40mg , Spironolactone 25 mg, Zocor 20 Educational resources provided: brochure Self management tools provided: other (see comments) Other plans: none. Consider titration up of Lasix or Coreg if BP uncontrolled in the future

## 2013-01-20 NOTE — Progress Notes (Signed)
OFFICE PROGRESS NOTE  CC [name], MD [name], MD  Cresenciano Genre, MD [address] [place] [address]  DIAGNOSIS: 62 year old female with  #1 stage II A. ER/PR positive HER-2/neu positive invasive ductal carcinoma of the left breast diagnosed October 2011. Patient is status post bilateral mastectomies.  #2 status post right CVA.  #3 right internal jugular deep venous thrombosis secondary to her Port-A-Cath  #4 status post PICC line placement for ongoing Herceptin therapy.  PRIOR THERAPY:  #1 Status post bilateral mastectomy performed in October 2011  #2 The left breast: final pathology revealed infiltrating ductal carcinoma that was ER positive PR positive HER-2/neu positive. Right breast specimen only revealed benign breast tissue.  #3 patient received adjuvant chemotherapy consisting of weekly Taxotere carboplatinum and Herceptin for a total of 6 cycle.  #4 Patient has completed Herceptin on 04/17/2011.  #5 she has been on letrozole 2.5 mg starting June 2012.  CURRENT THERAPY: Letrozole 2.5 mg daily since June 2012  INTERVAL HISTORY: [name] 62 y.o. female returns today follow up visit. She was recently seen at her primary care physician's office. She told them that she was having some left-sided chest pain anteriorly. It is reproducible. Is not associated with any shortness of breath or diaphoresis does not radiate. She also noted some swelling in the axilla on the left side. She has no nausea or vomiting she is taking her letrozole. She tells me that she's been lifting quite a bit of heavy things by helping her brother. She does have a lot of anxiety and she gets upset over everything. She denies any headaches double vision blurring of vision no fevers chills. No abdominal pain no diarrhea or constipation no back pain. Remainder of the 10 point review of systems is negative.   MEDICAL HISTORY: Past Medical History  Diagnosis Date  .  Depression     seasonal melancholia  . Hyperlipidemia   . Hypertension   . Myopathy     not statin related, cause unknown   . Back pain   . Edema extremities   . Chemotherapy follow-up examination 03/06/2011  . Cough 03/06/2011  . Arthritis     DJD, lumbar spondylosis  . Bronchitis 03/10/2012  . Asthma   . GERD (gastroesophageal reflux disease)   . Cataract   . Breast cancer     left breast invasive ductal ca in remission as of 10/07/11 s/p double mastectomy    ALLERGIES:  is allergic to atorvastatin and rosuvastatin.  MEDICATIONS:  Current Outpatient Prescriptions  Medication Sig Dispense Refill  . albuterol (PROVENTIL,VENTOLIN) 90 MCG/ACT inhaler Inhale 2 puffs into the lungs every 4 (four) hours as needed. For wheezing      . amLODipine (NORVASC) 5 MG tablet Take 1 tablet (5 mg total) by mouth daily.  30 tablet  5  . aspirin 81 MG tablet Take 81 mg by mouth daily.      . carvedilol (COREG) 6.25 MG tablet Take 1 tablet (6.25 mg total) by mouth 2 (two) times daily with a meal.  180 tablet  1  . cholecalciferol (VITAMIN D) 1000 UNITS tablet Take 1,000 Units by mouth daily.      . ferrous sulfate 325 (65 FE) MG tablet Take 1 tablet (325 mg total) by mouth 3 (three) times daily with meals.  90 tablet  6  . Fluticasone-Salmeterol (ADVAIR) 100-50 MCG/DOSE AEPB Inhale 1 puff into the lungs 2 (two) times daily as needed (wheezing).       Marland Kitchen  furosemide (LASIX) 40 MG tablet Take 40 mg by mouth as needed (excess fluid).      Marland Kitchen letrozole (FEMARA) 2.5 MG tablet Take 1 tablet (2.5 mg total) by mouth daily.  30 tablet  12  . LORazepam (ATIVAN) 1 MG tablet Take 1 tablet (1 mg total) by mouth daily as needed. For anxiety  30 tablet  5  . Multiple Vitamin (MULTIVITAMIN) tablet Take 1 tablet by mouth daily.       Marland Kitchen olmesartan (BENICAR) 40 MG tablet Take 1 tablet (40 mg total) by mouth daily.  30 tablet  5  . pantoprazole (PROTONIX) 40 MG tablet Take 1 tablet (40 mg total) by mouth daily.  90 tablet   1  . simvastatin (ZOCOR) 20 MG tablet Take 1 tablet (20 mg total) by mouth at bedtime.  90 tablet  1  . spironolactone (ALDACTONE) 25 MG tablet Take 1 tablet (25 mg total) by mouth daily.  30 tablet  12  . traMADol (ULTRAM) 50 MG tablet Take 1 tablet (50 mg total) by mouth every 6 (six) hours as needed. For pain  90 tablet  3  . triamcinolone cream (KENALOG) 0.1 % Apply topically 2 (two) times daily as needed.  454 g  0  . hydrocortisone (ANUSOL-HC) 25 MG suppository Place 1 suppository (25 mg total) rectally 2 (two) times daily as needed.  20 suppository  6   No current facility-administered medications for this visit.   Facility-Administered Medications Ordered in Other Visits  Medication Dose Route Frequency Provider Last Rate Last Dose  . sodium chloride 0.9 % injection 10 mL  10 mL Intravenous PRN Deatra Robinson, MD   10 mL at 01/26/11 1035    SURGICAL HISTORY:  Past Surgical History  Procedure Laterality Date  . Abdominal hysterectomy      for fibroids   . Laminectomy      C5-6 Dr. Louanne Skye 2005   . Oophorectomy    . Mastectomy      bilateral   . Tonsillectomy    . Appendectomy    . Incontinence surgery    . Breast surgery  2011    Bilateral mastectomy    REVIEW OF SYSTEMS:  General: fatigue (-), night sweats (-), fever (-), pain (-) Lymph: palpable nodes (-) HEENT: vision changes (-), mucositis (-), gum bleeding (-), epistaxis (-) Cardiovascular: chest pain (-), palpitations (-) Pulmonary: shortness of breath (-), dyspnea on exertion (-), cough (-), hemoptysis (-) GI:  Early satiety (-), melena (-), dysphagia (-), nausea/vomiting (-), diarrhea (-) GU: dysuria (-), hematuria (-), incontinence (-) Musculoskeletal: joint swelling (-), joint pain (-), back pain (-) Neuro: weakness (-), numbness (-), headache (-), confusion (-) Skin: Rash (-), lesions (-), dryness (-) Psych: depression (-), suicidal/homicidal ideation (-), feeling of hopelessness (-)  Health  Maintenance  Mammogram: n/a Colonoscopy: 05/2012 Bone Density Scan: 07/2011-normal Pap Smear: about 1 year ago Eye Exam: 05/2012 Vitamin D Level: 04/25/2012 Lipid Panel:  08/2012    PHYSICAL EXAMINATION:  BP 168/90  Pulse 97  Temp(Src) 98.2 F (36.8 C) (Oral)  Resp 20  Ht 5\' 8"  (1.727 m)  Wt 238 lb 3.2 oz (108.047 kg)  BMI 36.23 kg/m2  LMP 12/31/1968 General: Patient is a well appearing female in no acute distress HEENT: PERRLA, sclerae anicteric no conjunctival pallor, MMM Neck: supple, no palpable adenopathy Lungs: clear to auscultation bilaterally, no wheezes, rhonchi, or rales Cardiovascular: regular rate rhythm, S1, S2, no murmurs, rubs or gallops Abdomen: Soft, non-tender, non-distended,  normoactive bowel sounds, no HSM Extremities: warm and well perfused, no clubbing, cyanosis, or edema Skin: No rashes or lesions Neuro: Non-focal Anterior chest wall bilateral mastectomy scars are well healed there is no evidence of local recurrence. ECOG PERFORMANCE STATUS: 0 - Asymptomatic  LABORATORY DATA: Lab Results  Component Value Date   WBC 6.7 01/20/2013   HGB 11.5* 01/20/2013   HCT 34.2* 01/20/2013   MCV 85.4 01/20/2013   PLT 247 01/20/2013      Chemistry      Component Value Date/Time   NA 143 01/20/2013 1024   NA 142 12/14/2012 1502   K 4.6 01/20/2013 1024   K 4.3 12/14/2012 1502   CL 102 12/14/2012 1502   CL 103 04/25/2012 1045   CO2 22 01/20/2013 1024   CO2 27 12/14/2012 1502   BUN 19.8 01/20/2013 1024   BUN 29* 12/14/2012 1502   CREATININE 1.3* 01/20/2013 1024   CREATININE 1.41* 12/14/2012 1502   CREATININE 1.43* 11/23/2012 2005      Component Value Date/Time   CALCIUM 10.6* 01/20/2013 1024   CALCIUM 10.2 12/14/2012 1502   ALKPHOS 72 01/20/2013 1024   ALKPHOS 76 12/14/2012 1502   AST 20 01/20/2013 1024   AST 20 12/14/2012 1502   ALT 25 01/20/2013 1024   ALT 22 12/14/2012 1502   BILITOT 0.66 01/20/2013 1024   BILITOT 0.4 12/14/2012 1502        RADIOGRAPHIC STUDIES:  No results found.  ASSESSMENT: 62 year old female with:  1.  stage II a invasive ductal carcinoma of the left breast diagnosed in October 2011. She was found to have a ER/PR positive HER-2/neu positive breast cancer.She underwent bilateral mastectomies. Her right mastectomy was an elective prophylactic mastectomy due to her on comfort. Patient subsequently underwent adjuvant chemotherapy consisting of Taxotere carboplatinum and Herceptin for a total of 6 cycles. Thereafter she received Herceptin every 3 weeks and she has been on Letrozole 2.5 mg daily. Overall she is tolerating it well she has no evidence of recurrent disease.  #2 anterior chest pain will obtain bone scan  #3 right upper extremity swelling for her to physical therapy  #4 anxiety I have ordered Effexor   PLAN:  #1 proceed with staging bone scan  #2 referral to physical therapy for right upper extremity swelling.  #3 Effexor XR 37.5 mg daily.  #4 patient will be seen back in 6 months time or sooner if need arises.  All questions were answered. The patient knows to call the clinic with any problems, questions or concerns. We can certainly see the patient much sooner if necessary.  I spent 25 minutes counseling the patient face to face. The total time spent in the appointment was 30 minutes.  Marcy Panning, MD Medical/Oncology [facility] [phone] (beeper) [phone] (Office)  01/20/2013, 11:33 AM

## 2013-01-21 LAB — VITAMIN D 25 HYDROXY (VIT D DEFICIENCY, FRACTURES): Vit D, 25-Hydroxy: 46 ng/mL (ref 30–89)

## 2013-01-24 ENCOUNTER — Telehealth (HOSPITAL_COMMUNITY): Payer: Self-pay | Admitting: Cardiology

## 2013-01-24 NOTE — Telephone Encounter (Signed)
received voicemail from IM clinic, pt needs a follow up appt with [facility] for chest pain 11/4 [facility]

## 2013-01-25 ENCOUNTER — Ambulatory Visit (HOSPITAL_COMMUNITY)
Admission: RE | Admit: 2013-01-25 | Discharge: 2013-01-25 | Disposition: A | Discharge status: Home / Self Care | Payer: Medicare Other | Source: Ambulatory Visit | Attending: Internal Medicine | Admitting: Internal Medicine

## 2013-01-25 VITALS — HR 86 | Wt 236.8 lb

## 2013-01-25 DIAGNOSIS — R079 Chest pain, unspecified: Secondary | ICD-10-CM | POA: Diagnosis present

## 2013-01-25 NOTE — Patient Instructions (Addendum)
Your physician has requested that you have a lexiscan myoview. For further information please visit [email or web address]. Please follow instruction sheet, as given.  We will contact you in 6 months to schedule your next appointment.

## 2013-01-25 NOTE — Progress Notes (Signed)
HPI: [name] is a 62 y/o women with h/o obesity, HTN, asthma and HL. She also has history of TIA in April 2012.    Found to have L ductal breast CA. ER/PR +. HER 2 neu was equivocal. s/p bilateral mastectomies in 01/2010. Lymph nodes negative.   She completed herceptin therapy Jan. 2013.  Echo April 10, 2010 EF 55-60%. Grade 1 diastolic dusfunction. MUGA 2/12 EF 73%  Echo April 9,2012 EF 60-65% lat s' 10.43 (? Artifact) Echo 12/13/10 60-65% poor windows for lat s' peak I see is 8.09 ECHO 02/24/12 EF 55-60% lateral s' 6.5 ECHO 07/02/11 EF 55-60% lateral S' 6.9 RV mildly dilated but normal function Echo 05/30/2012: EF 60-65% lat s' 7.1, Grade 1 diastolic dysfunction September 2012 we changed HCTZ to lasix for longstanding LE edema (since her daughter was born in 73).    She returns for follow up. In September she was evaluated in HF clinic for chest discomfort which was thought to be related to stress and anxiety. PCP adjusted antidepressants. She reports ongoing chest discomfort that occurs at rest and with exertion.  She does say it is better when she relaxes.  Denies SOB/Orhtopnea/PND. She only take lasix 1-2 times a week.     ROS: All systems negative except as listed in HPI, PMH and Problem List.  Past Medical History  Diagnosis Date  . Depression     seasonal melancholia  . Hyperlipidemia   . Hypertension   . Myopathy     not statin related, cause unknown   . Back pain   . Edema extremities   . Chemotherapy follow-up examination 03/06/2011  . Cough 03/06/2011  . Arthritis     DJD, lumbar spondylosis  . Bronchitis 03/10/2012  . Asthma   . GERD (gastroesophageal reflux disease)   . Cataract   . Breast cancer     left breast invasive ductal ca in remission as of 10/07/11 s/p double mastectomy   Current Outpatient Prescriptions on File Prior to Encounter  Medication Sig Dispense Refill  . albuterol (PROVENTIL,VENTOLIN) 90 MCG/ACT inhaler Inhale 2 puffs into the lungs every 4  (four) hours as needed. For wheezing      . amLODipine (NORVASC) 5 MG tablet Take 1 tablet (5 mg total) by mouth daily.  30 tablet  5  . aspirin 81 MG tablet Take 81 mg by mouth daily.      . carvedilol (COREG) 6.25 MG tablet Take 1 tablet (6.25 mg total) by mouth 2 (two) times daily with a meal.  180 tablet  1  . cholecalciferol (VITAMIN D) 1000 UNITS tablet Take 1,000 Units by mouth daily.      . ferrous sulfate 325 (65 FE) MG tablet Take 1 tablet (325 mg total) by mouth 3 (three) times daily with meals.  90 tablet  6  . Fluticasone-Salmeterol (ADVAIR) 100-50 MCG/DOSE AEPB Inhale 1 puff into the lungs 2 (two) times daily as needed (wheezing).       . furosemide (LASIX) 40 MG tablet Take 40 mg by mouth as needed (excess fluid).      Marland Kitchen letrozole (FEMARA) 2.5 MG tablet Take 1 tablet (2.5 mg total) by mouth daily.  30 tablet  12  . LORazepam (ATIVAN) 1 MG tablet Take 1 tablet (1 mg total) by mouth daily as needed. For anxiety  30 tablet  5  . Multiple Vitamin (MULTIVITAMIN) tablet Take 1 tablet by mouth daily.       Marland Kitchen olmesartan (BENICAR) 40 MG tablet Take  1 tablet (40 mg total) by mouth daily.  30 tablet  5  . pantoprazole (PROTONIX) 40 MG tablet Take 1 tablet (40 mg total) by mouth daily.  90 tablet  1  . simvastatin (ZOCOR) 20 MG tablet Take 1 tablet (20 mg total) by mouth at bedtime.  90 tablet  1  . spironolactone (ALDACTONE) 25 MG tablet Take 1 tablet (25 mg total) by mouth daily.  30 tablet  12  . traMADol (ULTRAM) 50 MG tablet Take 1 tablet (50 mg total) by mouth every 6 (six) hours as needed. For pain  90 tablet  3  . triamcinolone cream (KENALOG) 0.1 % Apply topically 2 (two) times daily as needed.  454 g  0  . venlafaxine XR (EFFEXOR-XR) 37.5 MG 24 hr capsule Take 1 capsule (37.5 mg total) by mouth daily.  30 capsule  6   Current Facility-Administered Medications on File Prior to Encounter  Medication Dose Route Frequency Provider Last Rate Last Dose  . sodium chloride 0.9 % injection  10 mL  10 mL Intravenous PRN Deatra Robinson, MD   10 mL at 01/26/11 1035    Filed Vitals:   01/25/13 1414  Pulse: 86  Weight: 236 lb 12.8 oz (107.412 kg)  SpO2: 100%   PHYSICAL EXAM: General:  Well appearing. No resp difficulty; daughter present HEENT: normal Neck: supple. JVP flat. Carotids 2+ bilaterally; no bruits. No lymphadenopathy or thryomegaly appreciated. Cor: PMI normal. Regular rate & rhythm. No rubs, murmurs; tender to palpate chest on R side Lungs: clear Abdomen: obese, soft, nontender, nondistended. No hepatosplenomegaly. No bruits or masses. Good bowel sounds. Extremities: no cyanosis, clubbing, rash. Tr-1+ pedal. Trace lower extremity edema Neuro: alert & orientedx3, cranial nerves grossly intact. Moves all 4 extremities w/o difficulty. Affect pleasant.  ASSESSMENT & PLAN:  1) CP- ongoing chest discomfort since September. Evaluated here in September for CP but it has not improved. Schedule Lexi Scan Myoview.   Follow up in 6 months unless Lexi Scan postivie.  -  Misha Vanoverbeke NP-C 2:22 PM

## 2013-01-26 ENCOUNTER — Ambulatory Visit: Payer: Medicare Other | Admitting: Internal Medicine

## 2013-01-27 ENCOUNTER — Encounter: Payer: Self-pay | Admitting: Oncology

## 2013-01-27 NOTE — Progress Notes (Signed)
Bone scan auth # [id number] 01/27/13-03/13/13.

## 2013-01-30 ENCOUNTER — Encounter (HOSPITAL_COMMUNITY)
Admission: RE | Admit: 2013-01-30 | Discharge: 2013-01-30 | Disposition: A | Discharge status: Home / Self Care | Payer: Medicare Other | Source: Ambulatory Visit | Attending: Oncology | Admitting: Oncology

## 2013-01-30 DIAGNOSIS — C50919 Malignant neoplasm of unspecified site of unspecified female breast: Secondary | ICD-10-CM

## 2013-01-30 MED ORDER — TECHNETIUM TC 99M MEDRONATE IV KIT
24.9000 | PACK | Freq: Once | INTRAVENOUS | Status: AC | PRN
  Administered 2013-01-30: 24.9 via INTRAVENOUS

## 2013-02-01 ENCOUNTER — Ambulatory Visit: Payer: Medicare Other | Attending: Oncology | Admitting: Physical Therapy

## 2013-02-01 ENCOUNTER — Telehealth: Payer: Self-pay

## 2013-02-01 DIAGNOSIS — I890 Lymphedema, not elsewhere classified: Secondary | ICD-10-CM | POA: Insufficient documentation

## 2013-02-01 DIAGNOSIS — IMO0001 Reserved for inherently not codable concepts without codable children: Secondary | ICD-10-CM | POA: Insufficient documentation

## 2013-02-01 NOTE — Telephone Encounter (Signed)
Left message reminding patient to schedule mammogram.

## 2013-02-02 ENCOUNTER — Ambulatory Visit: Payer: Medicare Other | Admitting: Internal Medicine

## 2013-02-03 ENCOUNTER — Telehealth: Payer: Self-pay | Admitting: *Deleted

## 2013-02-03 NOTE — Telephone Encounter (Signed)
Message copied by Lucile Crater on Fri Feb 03, 2013 12:15 PM ------      Message from: Deatra Robinson      Created: Fri Feb 03, 2013 11:48 AM      Regarding: RE: Results       Let patient know that bone scan normal, no cancer      ----- Message -----         From: Zipporah Plants, RN         Sent: 02/03/2013  11:29 AM           To: Deatra Robinson, MD, Minette Headland, NP      Subject: Results                                                  Pt called regarding results from Bone scan on Monday 11/10.      Last seen 10/31, next f/u 08/10/13      Please advise thanks,      Stanton Kidney       ------

## 2013-02-03 NOTE — Telephone Encounter (Signed)
Per MD, notified pt Bone scan normal, no cancer. Pt thanked [place] for the call, no further concerns.

## 2013-02-06 ENCOUNTER — Ambulatory Visit: Payer: Medicare Other

## 2013-02-06 ENCOUNTER — Encounter: Payer: Self-pay | Admitting: Internal Medicine

## 2013-02-08 ENCOUNTER — Ambulatory Visit: Payer: Medicare Other | Admitting: Physical Therapy

## 2013-02-09 ENCOUNTER — Other Ambulatory Visit (INDEPENDENT_AMBULATORY_CARE_PROVIDER_SITE_OTHER): Payer: Self-pay | Admitting: Surgery

## 2013-02-13 ENCOUNTER — Ambulatory Visit (HOSPITAL_COMMUNITY): Payer: Medicare Other | Attending: Internal Medicine | Admitting: Radiology

## 2013-02-13 ENCOUNTER — Encounter: Payer: Medicare Other | Admitting: Physical Therapy

## 2013-02-13 ENCOUNTER — Other Ambulatory Visit (INDEPENDENT_AMBULATORY_CARE_PROVIDER_SITE_OTHER): Payer: Self-pay | Admitting: *Deleted

## 2013-02-13 VITALS — BP 151/85 | HR 71 | Ht 68.0 in | Wt 233.0 lb

## 2013-02-13 DIAGNOSIS — R079 Chest pain, unspecified: Secondary | ICD-10-CM

## 2013-02-13 DIAGNOSIS — I10 Essential (primary) hypertension: Secondary | ICD-10-CM | POA: Insufficient documentation

## 2013-02-13 DIAGNOSIS — Z8249 Family history of ischemic heart disease and other diseases of the circulatory system: Secondary | ICD-10-CM | POA: Insufficient documentation

## 2013-02-13 DIAGNOSIS — Z8673 Personal history of transient ischemic attack (TIA), and cerebral infarction without residual deficits: Secondary | ICD-10-CM | POA: Insufficient documentation

## 2013-02-13 DIAGNOSIS — R0602 Shortness of breath: Secondary | ICD-10-CM

## 2013-02-13 DIAGNOSIS — E785 Hyperlipidemia, unspecified: Secondary | ICD-10-CM | POA: Insufficient documentation

## 2013-02-13 DIAGNOSIS — R0609 Other forms of dyspnea: Secondary | ICD-10-CM | POA: Insufficient documentation

## 2013-02-13 DIAGNOSIS — R0989 Other specified symptoms and signs involving the circulatory and respiratory systems: Secondary | ICD-10-CM | POA: Insufficient documentation

## 2013-02-13 MED ORDER — TECHNETIUM TC 99M SESTAMIBI GENERIC - CARDIOLITE
10.0000 | Freq: Once | INTRAVENOUS | Status: AC | PRN
  Administered 2013-02-13: 10 via INTRAVENOUS

## 2013-02-13 MED ORDER — TECHNETIUM TC 99M SESTAMIBI GENERIC - CARDIOLITE
30.0000 | Freq: Once | INTRAVENOUS | Status: AC | PRN
  Administered 2013-02-13: 30 via INTRAVENOUS

## 2013-02-13 MED ORDER — REGADENOSON 0.4 MG/5ML IV SOLN
0.4000 mg | Freq: Once | INTRAVENOUS | Status: AC
  Administered 2013-02-13: 0.4 mg via INTRAVENOUS

## 2013-02-13 MED ORDER — UNABLE TO FIND: Status: DC

## 2013-02-13 NOTE — Progress Notes (Signed)
[facility]UCLE[facility] [address] [place], [place] [address] [phone]    Cardiology Nuclear Med Study  [name] is a 62 y.o. female     MRN : [id number]     DOB: [birth year]  Procedure Date: 02/13/2013  Nuclear Med Background Indication for Stress Test:  Evaluation for Ischemia History:  No prior known history of CAD, '12 Muga: EF=73%, and 2014 Echo: EF=55-60% Cardiac Risk Factors: Family History - CAD, Hypertension, TIA, and Lipids  Symptoms:Chest Pain with/without exertion (last occurrence 5 minutes prior to [place], 4/10),   DOE and SOB   Nuclear Pre-Procedure Caffeine/Decaff Intake:  None > 12 hrs NPO After: 9:00pm   Lungs:  clear O2 Sat: 98% on room air. IV 0.9% NS with Angio Cath:  22g  IV Site: R Antecubital x 1, tolerated well IV Started by:  Irven Baltimore, RN  Chest Size (in):  38 Cup Size: A  Height: 5\' 8"  (1.727 m)  Weight:  233 lb (105.688 kg)  BMI:  Body mass index is 35.44 kg/(m^2). Tech Comments:  Patient took Coreg at 0500 today.    Nuclear Med Study 1 or 2 day study: 1 day  Stress Test Type:  Treadmill/Lexiscan  Reading MD: Dorris Carnes, MD  Order Authorizing Provider:  [name], MD  Resting Radionuclide: Technetium 97m Sestamibi  Resting Radionuclide Dose: 11.0 mCi   Stress Radionuclide:  Technetium 87m Sestamibi  Stress Radionuclide Dose: 33.0 mCi           Stress Protocol Rest HR: 71 Stress HR: 126  Rest BP: 151/85 Stress BP: 201/70  Exercise Time (min): 2:00 METS: n/a   Predicted Max HR: 156 bpm % Max HR: 80.77 bpm Rate Pressure Product: 25326   Dose of Adenosine (mg):  n/a Dose of Lexiscan: 0.4 mg  Dose of Atropine (mg): n/a Dose of Dobutamine: n/a mcg/kg/min (at max HR)  Stress Test Technologist: Irven Baltimore, RN  Nuclear Technologist:  Jennelle Human, CNMT     Rest Procedure:  Myocardial perfusion imaging was performed at rest 45 minutes following the intravenous administration of Technetium 85m  Sestamibi. Rest ECG: NSR - Normal EKG  Stress Procedure:  The patient received IV Lexiscan 0.4 mg over 15-seconds with concurrent low level exercise and then Technetium 51m Sestamibi was injected at 30-seconds while the patient continued walking one more minute.  The patient had baseline chest pain, 4/10, that decreased to 2/10 with Lexiscan.The patient complained of warmth, lightheadedness, stomach cramp, and headache with Lexiscan.Quantitative spect images were obtained after a 45-minute delay. Stress ECG: No significant change from baseline ECG  QPS Raw Data Images:  Soft tissue (diaphragm, subcutaneous fat) surround heart. Stress Images:  Small defect in the inferolateral wall (base, mid) and medium in the inferior wall (base, mid)  Otherwise normal perfusion.   Rest Images:  Minimal improvement in the mid interior wall Subtraction (SDS):  No significant ischemia.   Transient Ischemic Dilatation (Normal <1.22):  1.00 Lung/Heart Ratio (Normal <0.45):  0.27  Quantitative Gated Spect Images QGS EDV:  82 ml QGS ESV:  26 ml  Impression Exercise Capacity:  Lexiscan with low level exercise. BP Response:  Normal blood pressure response. Clinical Symptoms:  Mild chest pain/dyspnea. ECG Impression:  No significant ST segment change suggestive of ischemia. Comparison with Prior Nuclear Study: No previous nuclear study performed  Overall Impression: Probable normal perfusion and soft tissue attenuation (diaphragm)  No significant ischemia or scar.  Low risk scan.  LV  Ejection Fraction: 68%.  LV Wall Motion:  NL LV Function; NL Wall Motion  Dorris Carnes

## 2013-02-14 ENCOUNTER — Ambulatory Visit: Payer: Medicare Other

## 2013-02-15 ENCOUNTER — Encounter: Payer: Medicare Other | Admitting: Physical Therapy

## 2013-02-19 NOTE — Addendum Note (Signed)
Encounter addended by: Conrad Delmita, NP on: 02/19/2013  1:40 PM<BR>     Documentation filed: Follow-up Section, LOS Section, Problem List, Notes Section

## 2013-02-20 ENCOUNTER — Ambulatory Visit: Payer: Medicare Other | Attending: Oncology

## 2013-02-20 DIAGNOSIS — IMO0001 Reserved for inherently not codable concepts without codable children: Secondary | ICD-10-CM | POA: Insufficient documentation

## 2013-02-20 DIAGNOSIS — I890 Lymphedema, not elsewhere classified: Secondary | ICD-10-CM | POA: Insufficient documentation

## 2013-02-22 ENCOUNTER — Ambulatory Visit: Payer: Medicare Other | Admitting: Physical Therapy

## 2013-02-27 ENCOUNTER — Ambulatory Visit: Payer: Medicare Other | Admitting: Physical Therapy

## 2013-03-01 ENCOUNTER — Ambulatory Visit: Payer: Medicare Other | Admitting: Physical Therapy

## 2013-03-03 ENCOUNTER — Encounter: Payer: Self-pay | Admitting: Family Medicine

## 2013-03-03 ENCOUNTER — Ambulatory Visit (INDEPENDENT_AMBULATORY_CARE_PROVIDER_SITE_OTHER): Payer: Medicare Other | Admitting: Family Medicine

## 2013-03-03 VITALS — BP 135/86 | HR 75 | Ht 68.0 in | Wt 233.0 lb

## 2013-03-03 DIAGNOSIS — M722 Plantar fascial fibromatosis: Secondary | ICD-10-CM | POA: Insufficient documentation

## 2013-03-03 NOTE — Assessment & Plan Note (Signed)
Patient's history and physical exam consistent with bilateral plantar fasciitis. Patient given an athletic insoles today.    Patient encouraged to buy a good athletic tennis shoe and use inserts until followup. This will hopefully improve her symptoms.  No obvious source/etiology of her plantar fasciitis, other than poor shoe support. Patient may benefit from steroid injection in the near future.

## 2013-03-03 NOTE — Progress Notes (Signed)
   Subjective:    Patient ID: [name], female    DOB: 1951-03-11, 62 y.o.   MRN: [id number]  CC: Heel pain  HPI  62 year old female presents for evaluation of Heel pain.  Patient reports bilateral heel pain located on the plantar aspect.  It has been present for 1.5 months.  Pain is worse first thing in the morning.  No preceding trauma, injury, or increase in physical activity.  She has tried a heel and full insert with no relief in pain.  No medications tried.    Of note, patient and daughter report that she likes to wear "flats."  Her shoes typically have poor support.  Additionally, the floors in her home her meds concrete which she thinks may be exacerbating her pain as she walks quite a bit.  Review of Systems Per HPI    Objective:   Physical Exam Filed Vitals:   03/03/13 0939  BP: 135/86  Pulse: 75   General: pleasant elderly female.  Well developed, well nourished, NAD.  MSK: Feet/ankle: Ankles: No visible erythema or swelling. Range of motion is full in all directions. Strength is 5/5 in all directions. Stable lateral and medial ligaments  Feet: Tenderness noted at the heel at the attachment of the plantar fascia.  No palpable defects/tears in the plantar fascia. Normal Achilles tendon.  No tenderness to palpation at the insertion site of Achilles tendon. Normal longitudinal arch.  Gait: Patient with normal gait.  She does slightly externally rotate.  No pronation or supination noted.    Assessment & Plan:  See Problem List

## 2013-03-06 ENCOUNTER — Ambulatory Visit: Payer: Medicare Other | Admitting: Physical Therapy

## 2013-03-08 ENCOUNTER — Encounter: Payer: Medicare Other | Admitting: Physical Therapy

## 2013-03-09 ENCOUNTER — Ambulatory Visit (INDEPENDENT_AMBULATORY_CARE_PROVIDER_SITE_OTHER): Payer: Medicare Other | Admitting: Internal Medicine

## 2013-03-09 ENCOUNTER — Encounter: Payer: Self-pay | Admitting: Internal Medicine

## 2013-03-09 VITALS — BP 138/76 | HR 69 | Temp 97.3°F | Ht 68.0 in | Wt 243.4 lb

## 2013-03-09 DIAGNOSIS — M722 Plantar fascial fibromatosis: Secondary | ICD-10-CM

## 2013-03-09 DIAGNOSIS — I10 Essential (primary) hypertension: Secondary | ICD-10-CM

## 2013-03-09 DIAGNOSIS — K219 Gastro-esophageal reflux disease without esophagitis: Secondary | ICD-10-CM

## 2013-03-09 DIAGNOSIS — I8310 Varicose veins of unspecified lower extremity with inflammation: Secondary | ICD-10-CM

## 2013-03-09 MED ORDER — PANTOPRAZOLE SODIUM 40 MG PO TBEC: 40.0000 mg | DELAYED_RELEASE_TABLET | Freq: Two times a day (BID) | ORAL | Status: DC

## 2013-03-09 NOTE — Patient Instructions (Addendum)
General Instructions: Read all the information below Follow up in 4-6 months, sooner if needed for heartburn i.e 2 months if Protonix dose change not helping Follow up with Sports Medicine 03/31/13 as scheduled.  Review heartburn info and try lifestyle modifications. Try Protonix 2x per day x 2 months    Treatment Goals:  Goals (1 Years of Data) as of 03/09/13         As of Today 03/03/13 02/13/13 01/20/13 01/19/13     Blood Pressure    . Blood Pressure < 140/90  138/76 135/86 151/85 168/90 134/78      Progress Toward Treatment Goals:  Treatment Goal 03/09/2013  Blood pressure at goal    Self Care Goals & Plans:  Self Care Goal 03/09/2013  Manage my medications take my medicines as prescribed; bring my medications to every visit; refill my medications on time; follow the sick day instructions if I am sick  Monitor my health keep track of my blood pressure  Eat healthy foods drink diet soda or water instead of juice or soda; eat more vegetables; eat foods that are low in salt; eat baked foods instead of fried foods; eat fruit for snacks and desserts; eat smaller portions  Be physically active find an activity I enjoy  Meeting treatment goals maintain the current self-care plan    No flowsheet data found.   Care Management & Community Referrals:  Referral 03/09/2013  Referrals made for care management support none needed  Referrals made to community resources none        Plantar Fasciitis-->Avoid flat shoes, you may try ice and stretching  Plantar fasciitis is a common condition that causes foot pain. It is soreness (inflammation) of the band of tough fibrous tissue on the bottom of the foot that runs from the heel bone (calcaneus) to the ball of the foot. The cause of this soreness may be from excessive standing, poor fitting shoes, running on hard surfaces, being overweight, having an abnormal walk, or overuse (this is common in runners) of the painful foot or feet. It is  also common in aerobic exercise dancers and ballet dancers. SYMPTOMS  Most people with plantar fasciitis complain of:  Severe pain in the morning on the bottom of their foot especially when taking the first steps out of bed. This pain recedes after a few minutes of walking.  Severe pain is experienced also during walking following a long period of inactivity.  Pain is worse when walking barefoot or up stairs DIAGNOSIS   Your caregiver will diagnose this condition by examining and feeling your foot.  Special tests such as X-rays of your foot, are usually not needed. PREVENTION   Consult a sports medicine professional before beginning a new exercise program.  Walking programs offer a good workout. With walking there is a lower chance of overuse injuries common to runners. There is less impact and less jarring of the joints.  Begin all new exercise programs slowly. If problems or pain develop, decrease the amount of time or distance until you are at a comfortable level.  Wear good shoes and replace them regularly.  Stretch your foot and the heel cords at the back of the ankle (Achilles tendon) both before and after exercise.  Run or exercise on even surfaces that are not hard. For example, asphalt is better than pavement.  Do not run barefoot on hard surfaces.  If using a treadmill, vary the incline.  Do not continue to workout if you have foot  or joint problems. Seek professional help if they do not improve. HOME CARE INSTRUCTIONS   Avoid activities that cause you pain until you recover.  Use ice or cold packs on the problem or painful areas after working out.  Only take over-the-counter or prescription medicines for pain, discomfort, or fever as directed by your caregiver.  Soft shoe inserts or athletic shoes with air or gel sole cushions may be helpful.  If problems continue or become more severe, consult a sports medicine caregiver or your own health care provider.  Cortisone is a potent anti-inflammatory medication that may be injected into the painful area. You can discuss this treatment with your caregiver. MAKE SURE YOU:   Understand these instructions.  Will watch your condition.  Will get help right away if you are not doing well or get worse. Document Released: 12/02/2000 Document Revised: 06/01/2011 Document Reviewed: 02/01/2008 [facility] Patient Information 2014 [employer], [place].  Gastroesophageal Reflux Disease, Adult Gastroesophageal reflux disease (GERD) happens when acid from your stomach flows up into the esophagus. When acid comes in contact with the esophagus, the acid causes soreness (inflammation) in the esophagus. Over time, GERD may create small holes (ulcers) in the lining of the esophagus. CAUSES   Increased body weight. This puts pressure on the stomach, making acid rise from the stomach into the esophagus.  Smoking. This increases acid production in the stomach.  Drinking alcohol. This causes decreased pressure in the lower esophageal sphincter (valve or ring of muscle between the esophagus and stomach), allowing acid from the stomach into the esophagus.  Late evening meals and a full stomach. This increases pressure and acid production in the stomach.  A malformed lower esophageal sphincter. Sometimes, no cause is found. SYMPTOMS   Burning pain in the lower part of the mid-chest behind the breastbone and in the mid-stomach area. This may occur twice a week or more often.  Trouble swallowing.  Sore throat.  Dry cough.  Asthma-like symptoms including chest tightness, shortness of breath, or wheezing. DIAGNOSIS  Your caregiver may be able to diagnose GERD based on your symptoms. In some cases, X-rays and other tests may be done to check for complications or to check the condition of your stomach and esophagus. TREATMENT  Your caregiver may recommend over-the-counter or prescription medicines to help decrease acid  production. Ask your caregiver before starting or adding any new medicines.  HOME CARE INSTRUCTIONS   Change the factors that you can control. Ask your caregiver for guidance concerning weight loss, quitting smoking, and alcohol consumption.  Avoid foods and drinks that make your symptoms worse, such as:  Caffeine or alcoholic drinks.  Chocolate.  Peppermint or mint flavorings.  Garlic and onions.  Spicy foods.  Citrus fruits, such as oranges, lemons, or limes.  Tomato-based foods such as sauce, chili, salsa, and pizza.  Fried and fatty foods.  Avoid lying down for the 3 hours prior to your bedtime or prior to taking a nap.  Eat small, frequent meals instead of large meals.  Wear loose-fitting clothing. Do not wear anything tight around your waist that causes pressure on your stomach.  Raise the head of your bed 6 to 8 inches with wood blocks to help you sleep. Extra pillows will not help.  Only take over-the-counter or prescription medicines for pain, discomfort, or fever as directed by your caregiver.  Do not take aspirin, ibuprofen, or other nonsteroidal anti-inflammatory drugs (NSAIDs). SEEK IMMEDIATE MEDICAL CARE IF:   You have pain in your arms,  neck, jaw, teeth, or back.  Your pain increases or changes in intensity or duration.  You develop nausea, vomiting, or sweating (diaphoresis).  You develop shortness of breath, or you faint.  Your vomit is green, yellow, black, or looks like coffee grounds or blood.  Your stool is red, bloody, or black. These symptoms could be signs of other problems, such as heart disease, gastric bleeding, or esophageal bleeding. MAKE SURE YOU:   Understand these instructions.  Will watch your condition.  Will get help right away if you are not doing well or get worse. Document Released: 12/17/2004 Document Revised: 06/01/2011 Document Reviewed: 09/26/2010 [facility] Patient Information 2014 [place], [place].  Diet for  Gastroesophageal Reflux Disease, Adult Reflux (acid reflux) is when acid from your stomach flows up into the esophagus. When acid comes in contact with the esophagus, the acid causes irritation and soreness (inflammation) in the esophagus. When reflux happens often or so severely that it causes damage to the esophagus, it is called gastroesophageal reflux disease (GERD). Nutrition therapy can help ease the discomfort of GERD. FOODS OR DRINKS TO AVOID OR LIMIT  Smoking or chewing tobacco. Nicotine is one of the most potent stimulants to acid production in the gastrointestinal tract.  Caffeinated and decaffeinated coffee and black tea.  Regular or low-calorie carbonated beverages or energy drinks (caffeine-free carbonated beverages are allowed).   Strong spices, such as black pepper, white pepper, red pepper, cayenne, curry powder, and chili powder.  Peppermint or spearmint.  Chocolate.  High-fat foods, including meats and fried foods. Extra added fats including oils, butter, salad dressings, and nuts. Limit these to less than 8 tsp per day.  Fruits and vegetables if they are not tolerated, such as citrus fruits or tomatoes.  Alcohol.  Any food that seems to aggravate your condition. If you have questions regarding your diet, call your caregiver or a registered dietitian. OTHER THINGS THAT MAY HELP GERD INCLUDE:   Eating your meals slowly, in a relaxed setting.  Eating 5 to 6 small meals per day instead of 3 large meals.  Eliminating food for a period of time if it causes distress.  Not lying down until 3 hours after eating a meal.  Keeping the head of your bed raised 6 to 9 inches (15 to 23 cm) by using a foam wedge or blocks under the legs of the bed. Lying flat may make symptoms worse.  Being physically active. Weight loss may be helpful in reducing reflux in overweight or obese adults.  Wear loose fitting clothing EXAMPLE MEAL PLAN This meal plan is approximately 2,000  calories based on [email or web address] meal planning guidelines. Breakfast   cup cooked oatmeal.  1 cup strawberries.  1 cup low-fat milk.  1 oz almonds. Snack  1 cup cucumber slices.  6 oz yogurt (made from low-fat or fat-free milk). Lunch  2 slice whole-wheat bread.  2 oz sliced [place].  2 tsp mayonnaise.  1 cup blueberries.  1 cup snap peas. Snack  6 whole-wheat crackers.  1 oz string cheese. Dinner   cup brown rice.  1 cup mixed veggies.  1 tsp olive oil.  3 oz grilled fish. Document Released: 03/09/2005 Document Revised: 06/01/2011 Document Reviewed: 01/23/2011 [facility] Patient Information 2014 [place], [place].  DASH Diet The DASH diet stands for "Dietary Approaches to Stop Hypertension." It is a healthy eating plan that has been shown to reduce high blood pressure (hypertension) in as little as 14 days, while also possibly providing other significant health benefits.  These other health benefits include reducing the risk of breast cancer after menopause and reducing the risk of type 2 diabetes, heart disease, colon cancer, and stroke. Health benefits also include weight loss and slowing kidney failure in patients with chronic kidney disease.  DIET GUIDELINES  Limit salt (sodium). Your diet should contain less than 1500 mg of sodium daily.  Limit refined or processed carbohydrates. Your diet should include mostly whole grains. Desserts and added sugars should be used sparingly.  Include small amounts of heart-healthy fats. These types of fats include nuts, oils, and tub margarine. Limit saturated and trans fats. These fats have been shown to be harmful in the body. CHOOSING FOODS  The following food groups are based on a 2000 calorie diet. See your Registered Dietitian for individual calorie needs. Grains and Grain Products (6 to 8 servings daily)  Eat More Often: Whole-wheat bread, brown rice, whole-grain or wheat pasta, quinoa, popcorn without added fat  or salt (air popped).  Eat Less Often: White bread, white pasta, white rice, cornbread. Vegetables (4 to 5 servings daily)  Eat More Often: Fresh, frozen, and canned vegetables. Vegetables may be raw, steamed, roasted, or grilled with a minimal amount of fat.  Eat Less Often/Avoid: Creamed or fried vegetables. Vegetables in a cheese sauce. Fruit (4 to 5 servings daily)  Eat More Often: All fresh, canned (in natural juice), or frozen fruits. Dried fruits without added sugar. One hundred percent fruit juice ( cup [237 mL] daily).  Eat Less Often: Dried fruits with added sugar. Canned fruit in light or heavy syrup. [employer], Fish, and Poultry (2 servings or less daily. One serving is 3 to 4 oz [85-114 g]).  Eat More Often: Ninety percent or leaner ground beef, tenderloin, sirloin. Round cuts of beef, chicken breast, [place] breast. All fish. Grill, bake, or broil your meat. Nothing should be fried.  Eat Less Often/Avoid: Fatty cuts of meat, [place], or chicken leg, thigh, or wing. Fried cuts of meat or fish. Dairy (2 to 3 servings)  Eat More Often: Low-fat or fat-free milk, low-fat plain or light yogurt, reduced-fat or part-skim cheese.  Eat Less Often/Avoid: Milk (whole, 2%).Whole milk yogurt. Full-fat cheeses. Nuts, Seeds, and Legumes (4 to 5 servings per week)  Eat More Often: All without added salt.  Eat Less Often/Avoid: Salted nuts and seeds, canned beans with added salt. Fats and Sweets (limited)  Eat More Often: Vegetable oils, tub margarines without trans fats, sugar-free gelatin. Mayonnaise and salad dressings.  Eat Less Often/Avoid: Coconut oils, palm oils, butter, stick margarine, cream, half and half, cookies, candy, pie. FOR MORE INFORMATION The Dash Diet Eating Plan: www.dashdiet.org Document Released: 02/26/2011 Document Revised: 06/01/2011 Document Reviewed: 02/26/2011 [facility] Patient Information 2014 [place], [place].  Hypertension As your heart beats, it  forces blood through your arteries. This force is your blood pressure. If the pressure is too high, it is called hypertension (HTN) or high blood pressure. HTN is dangerous because you may have it and not know it. High blood pressure may mean that your heart has to work harder to pump blood. Your arteries may be narrow or stiff. The extra work puts you at risk for heart disease, stroke, and other problems.  Blood pressure consists of two numbers, a higher number over a lower, 110/72, for example. It is stated as "110 over 72." The ideal is below 120 for the top number (systolic) and under 80 for the bottom (diastolic). Write down your blood pressure today. You should  pay close attention to your blood pressure if you have certain conditions such as:  Heart failure.  Prior heart attack.  Diabetes  Chronic kidney disease.  Prior stroke.  Multiple risk factors for heart disease. To see if you have HTN, your blood pressure should be measured while you are seated with your arm held at the level of the heart. It should be measured at least twice. A one-time elevated blood pressure reading (especially in the Emergency Department) does not mean that you need treatment. There may be conditions in which the blood pressure is different between your right and left arms. It is important to see your caregiver soon for a recheck. Most people have essential hypertension which means that there is not a specific cause. This type of high blood pressure may be lowered by changing lifestyle factors such as:  Stress.  Smoking.  Lack of exercise.  Excessive weight.  Drug/tobacco/alcohol use.  Eating less salt. Most people do not have symptoms from high blood pressure until it has caused damage to the body. Effective treatment can often prevent, delay or reduce that damage. TREATMENT  When a cause has been identified, treatment for high blood pressure is directed at the cause. There are a large number of  medications to treat HTN. These fall into several categories, and your caregiver will help you select the medicines that are best for you. Medications may have side effects. You should review side effects with your caregiver. If your blood pressure stays high after you have made lifestyle changes or started on medicines,   Your medication(s) may need to be changed.  Other problems may need to be addressed.  Be certain you understand your prescriptions, and know how and when to take your medicine.  Be sure to follow up with your caregiver within the time frame advised (usually within two weeks) to have your blood pressure rechecked and to review your medications.  If you are taking more than one medicine to lower your blood pressure, make sure you know how and at what times they should be taken. Taking two medicines at the same time can result in blood pressure that is too low. SEEK IMMEDIATE MEDICAL CARE IF:  You develop a severe headache, blurred or changing vision, or confusion.  You have unusual weakness or numbness, or a faint feeling.  You have severe chest or abdominal pain, vomiting, or breathing problems. MAKE SURE YOU:   Understand these instructions.  Will watch your condition.  Will get help right away if you are not doing well or get worse. Document Released: 03/09/2005 Document Revised: 06/01/2011 Document Reviewed: 10/28/2007 [facility] Patient Information 2014 [place].  Pantoprazole tablets What is this medicine? PANTOPRAZOLE (pan TOE pra zole) prevents the production of acid in the stomach. It is used to treat gastroesophageal reflux disease (GERD), inflammation of the esophagus, and Zollinger-Ellison syndrome. This medicine may be used for other purposes; ask your health care provider or pharmacist if you have questions. COMMON BRAND NAME(S): Protonix What should I tell my health care provider before I take this medicine? They need to know if you have any of  these conditions: -liver disease -low levels of magnesium in the blood -an unusual or allergic reaction to omeprazole, lansoprazole, pantoprazole, rabeprazole, other medicines, foods, dyes, or preservatives -pregnant or trying to get pregnant -breast-feeding How should I use this medicine? Take this medicine by mouth. Swallow the tablets whole with a drink of water. Follow the directions on the prescription label. Do  not crush, break, or chew. Take your medicine at regular intervals. Do not take your medicine more often than directed. Talk to your pediatrician regarding the use of this medicine in children. While this drug may be prescribed for children as young as 5 years for selected conditions, precautions do apply. Overdosage: If you think you have taken too much of this medicine contact a poison control center or emergency room at once. NOTE: This medicine is only for you. Do not share this medicine with others. What if I miss a dose? If you miss a dose, take it as soon as you can. If it is almost time for your next dose, take only that dose. Do not take double or extra doses. What may interact with this medicine? Do not take this medicine with any of the following medications: -atazanavir -nelfinavir This medicine may also interact with the following medications: -ampicillin -delavirdine -digoxin -diuretics -iron salts -medicines for fungal infections like ketoconazole, itraconazole and voriconazole -warfarin This list may not describe all possible interactions. Give your health care provider a list of all the medicines, herbs, non-prescription drugs, or dietary supplements you use. Also tell them if you smoke, drink alcohol, or use illegal drugs. Some items may interact with your medicine. What should I watch for while using this medicine? It can take several days before your stomach pain gets better. Check with your doctor or health care professional if your condition does not  start to get better, or if it gets worse. You may need blood work done while you are taking this medicine. What side effects may I notice from receiving this medicine? Side effects that you should report to your doctor or health care professional as soon as possible: -allergic reactions like skin rash, itching or hives, swelling of the face, lips, or tongue -bone, muscle or joint pain -breathing problems -chest pain or chest tightness -dark yellow or brown urine -dizziness -fast, irregular heartbeat -feeling faint or lightheaded -fever or sore throat -muscle spasm -palpitations -redness, blistering, peeling or loosening of the skin, including inside the mouth -seizures -tremors -unusual bleeding or bruising -unusually weak or tired -yellowing of the eyes or skin Side effects that usually do not require medical attention (Report these to your doctor or health care professional if they continue or are bothersome.): -constipation -diarrhea -dry mouth -headache -nausea This list may not describe all possible side effects. Call your doctor for medical advice about side effects. You may report side effects to FDA at 1-800-FDA-1088. Where should I keep my medicine? Keep out of the reach of children. Store at room temperature between 15 and 30 degrees C (59 and 86 degrees F). Protect from light and moisture. Throw away any unused medicine after the expiration date. NOTE: This sheet is a summary. It may not cover all possible information. If you have questions about this medicine, talk to your doctor, pharmacist, or health care provider.  2014, [employer]. (2012-01-06 16:40:16)

## 2013-03-10 ENCOUNTER — Encounter: Payer: Self-pay | Admitting: Internal Medicine

## 2013-03-10 MED ORDER — OLMESARTAN MEDOXOMIL 40 MG PO TABS: 40.0000 mg | ORAL_TABLET | Freq: Every day | ORAL | Status: DC

## 2013-03-10 MED ORDER — TRIAMCINOLONE ACETONIDE 0.1 % EX CREA: TOPICAL_CREAM | Freq: Two times a day (BID) | CUTANEOUS | Status: DC | PRN

## 2013-03-10 NOTE — Assessment & Plan Note (Addendum)
Failed Nexium.  Not controlled with tums, Protonix 40 mg qd will increase to bid x 2 months  Could consider Dexilant if this fails but prob next step would be to send to GI for further w/u

## 2013-03-10 NOTE — Addendum Note (Signed)
Addended by: Cresenciano Genre on: 03/10/2013 11:31 AM   Modules accepted: Orders, Medications

## 2013-03-10 NOTE — Progress Notes (Addendum)
Subjective:    Patient ID: [name], female    DOB: 07/17/1950, 62 y.o.   MRN: [id number]  HPI Comments: 62 y.o PMH Depression, Hyperlipidemia (LDL 89 08/2012), Hypertension (controlled BP 138/76)), myopathy cause unknown, chronic Back pain 2/2 DJD, lumbar spondylosis, chronic knee pain, chronic lower extremity edema, on chemo (i.e Femara) s/p b/l masectomy for left breast cancer, Bronchitis, Asthma, GERD, Cataract, hemorrhoids, grade 1 diastolic dysfunction (EF 0000000) has followed with Dr. Sung Amabile in the past), OSA (not of cpap yet due to affordability), plantar fascitis, chronic venous insufficiency, ext hemmrhoids, genital herpes, h/o IJ vein thrombosis, prediabetes, CKD, stasis dermatitis, and urinary incontinence.    1)She presents for heartburn mid chest that feels like indigestion.  She has tried Tums in the past w/o much relief.  Failed Nexium had relief with Protonix initially which is now not working.  She tastes a sour taste in her mouth daily.    2)She is unsure if she needs medication refills and would like me to call her pharmacy-per pharm needs Benicar and [facility] Cr Rx refills   3) She thinks that her lower extremity edema is improved after she has be massaging her legs b/l and wants to hold on PT currently  4) She c/o b/l heel pain dx'ed as plantar fascitis by Sports Medicine.  Pain is worse with standing initially at the back of her heels.  This pain makes her not want to walk.  She has tennis shoes and inserts and is going to get new compression stockings.  She has not had much relief with these changes.  She has not tried exercise or ice yet and did not know about this as tx.  Ultram alone does not help. She has f/u appt with Sports Medicine 03/31/12     Heartburn She complains of chest pain and heartburn. She reports no abdominal pain. mid chest pain feels like indigestion; sour taste in mouth feels . This is a chronic problem. The current episode started more than 1 year  ago. Episode frequency: daily. The problem has been gradually worsening (was initially improved on Nexium switched to Protonix  qd which is now not working). The heartburn is located in the substernum. The symptoms are aggravated by certain foods. She has tried a PPI for the symptoms. The treatment provided no relief.      Review of Systems  Respiratory: Negative for shortness of breath.   Cardiovascular: Positive for chest pain.  Gastrointestinal: Positive for heartburn. Negative for abdominal pain and constipation.       Objective:   Physical Exam  Nursing note and vitals reviewed. Constitutional: She is oriented to person, place, and time. Vital signs are normal. She appears well-developed and well-nourished. She is cooperative. No distress.  HENT:  Head: Normocephalic and atraumatic.  Mouth/Throat: Oropharynx is clear and moist and mucous membranes are normal. No oropharyngeal exudate.  Eyes: Conjunctivae are normal. Pupils are equal, round, and reactive to light. Right eye exhibits no discharge. Left eye exhibits no discharge. No scleral icterus.  Cardiovascular: Normal rate, regular rhythm, S1 normal, S2 normal and normal heart sounds.   No murmur heard. 1+ lower ext edema   Pulmonary/Chest: Effort normal and breath sounds normal. No respiratory distress. She has no wheezes.  Abdominal: Soft. Bowel sounds are normal. There is no tenderness.  Obese ab  Musculoskeletal:  ttp b/l plantar heels  Neurological: She is alert and oriented to person, place, and time. Gait normal.  Skin: Skin is warm  and dry. No rash noted. She is not diaphoretic.  Improved stasis dermatitis changes to b/l legs   Psychiatric: She has a normal mood and affect. Her speech is normal and behavior is normal. Judgment and thought content normal. Cognition and memory are normal.          Assessment & Plan:  Follow up in 4-6 months, sooner if needed for heartburn i.e 2 months if Protonix bid dose change  not helping

## 2013-03-10 NOTE — Assessment & Plan Note (Signed)
BP Readings from Last 3 Encounters:  03/09/13 138/76  03/03/13 135/86  02/13/13 151/85    Lab Results  Component Value Date   NA 143 01/20/2013   K 4.6 01/20/2013   CREATININE 1.3* 01/20/2013    Assessment: Blood pressure control: controlled Progress toward BP goal:  at goal Comments: none   Plan: Medications:  continue current medications Educational resources provided: brochure;other (see comments) Self management tools provided: home blood pressure logbook Other plans: f/u in 4-6 months

## 2013-03-10 NOTE — Assessment & Plan Note (Addendum)
Pt wearing tennis shoes and insert w/o much relief Could consider NSAIDS but pt has CKD Could consider course of narcotics until pt gets back to Sports Medicine since Ultram not working-pt prefers to only use Ultram for now  Gave pt info about stretching and she can try icing  F/u with SM 03/31/13-consider steroid injections as well

## 2013-03-13 ENCOUNTER — Encounter: Payer: Medicare Other | Admitting: Physical Therapy

## 2013-03-13 ENCOUNTER — Telehealth: Payer: Self-pay | Admitting: *Deleted

## 2013-03-13 NOTE — Telephone Encounter (Signed)
Pt called problems with increase gastric gas - doing a lot of burping. Saw Dr Loma Sousa last week.  Will try to watch foods and take Protonix two  times a day. If cont - will be glad to see  Or can try Urgent care. Hilda Blades Emalynn Clewis RN 03/13/13 1:50PM

## 2013-03-31 ENCOUNTER — Ambulatory Visit (INDEPENDENT_AMBULATORY_CARE_PROVIDER_SITE_OTHER): Payer: Medicare Other | Admitting: Family Medicine

## 2013-03-31 ENCOUNTER — Encounter: Payer: Self-pay | Admitting: Family Medicine

## 2013-03-31 VITALS — BP 150/87 | HR 79 | Ht 68.0 in | Wt 243.0 lb

## 2013-03-31 DIAGNOSIS — M722 Plantar fascial fibromatosis: Secondary | ICD-10-CM

## 2013-03-31 MED ORDER — METHYLPREDNISOLONE ACETATE 40 MG/ML IJ SUSP
40.0000 mg | Freq: Once | INTRAMUSCULAR | Status: AC
  Administered 2013-03-31: 40 mg via INTRA_ARTICULAR

## 2013-03-31 NOTE — Progress Notes (Signed)
CC: Followup plantar fasciitis HPI: Patient returns for followup of plantar fasciitis that she has in her bilateral feet. Unfortunately she is no better and is actually worse. She is doing some ankle exercises at home. She states it is bad first thing in the morning when she stands up as well as after she goes to relax for while. She has had this for about 2-1/2 months now and is fed up with it. She has been wearing the tennis shoes with sport insoles without improvement.  ROS: As above in the HPI. All other systems are stable or negative.  OBJECTIVE: APPEARANCE:  Patient in no acute distress.The patient appeared well nourished and normally developed. HEENT: No scleral icterus. Conjunctiva non-injected Resp: Non labored Skin: No rash MSK:  Foot exam: - Full range of motion of the foot and ankle - Mild hallux rigidus with loss of great toe extension - Bilateral lower chart he edema - Tender to palpation over the attachments of the plantar fascia to the calcaneus   MSK [place]: Not performed   ASSESSMENT: #1. Bilateral plantar fasciitis   PLAN: Patient desires injection today. We will go ahead with injection of the right plantar fascia to see if this improves her symptoms. Risks and benefits were discussed including infection, bleeding, fat pad atrophy. She was also given some additional home exercises to add to her regimen. We will see her back in one month for recheck.  Consent obtained and verified.  Time-out conducted.  Noted no overlying erythema, induration, or other signs of local infection.  Skin prepped in a sterile fashion.  Topical analgesic spray: Ethyl chloride.  Site: Right plantar fascia Needle: 22 gauge 1-1/2 inch Completed without difficulty.  Meds: 3 cc 1% lidocaine, 1 cc depomedrol Advised to call if fevers/chills, erythema, induration, drainage, or persistent bleeding.

## 2013-03-31 NOTE — Patient Instructions (Signed)
Thank you for coming in today  We injected your right plantar fascia  1. Frozen water bottle ice massage 10 minutes 2x per day 2. Stretch your heel cord before you stand up 3. Big toe extension and massage several times per day 4. Eccentric calf raises 3 x 15  Followup 1 month

## 2013-04-14 ENCOUNTER — Telehealth: Payer: Self-pay | Admitting: Oncology

## 2013-04-14 NOTE — Telephone Encounter (Signed)
pt called and needed an appt...done...pt ok and aware

## 2013-04-27 NOTE — Progress Notes (Signed)
Case discussed with Dr. McLean at the time of the visit.  We reviewed the resident's history and exam and pertinent patient test results.  I agree with the assessment, diagnosis, and plan of care documented in the resident's note.     

## 2013-05-04 ENCOUNTER — Ambulatory Visit: Payer: Medicare Other | Admitting: Oncology

## 2013-05-04 ENCOUNTER — Telehealth: Payer: Self-pay | Admitting: *Deleted

## 2013-05-04 NOTE — Telephone Encounter (Signed)
Pt came in today to cancel her appt for today. She stated that she will return on 08/10/13. i printed the avs...td

## 2013-05-10 ENCOUNTER — Encounter: Payer: Self-pay | Admitting: Internal Medicine

## 2013-05-10 ENCOUNTER — Ambulatory Visit (INDEPENDENT_AMBULATORY_CARE_PROVIDER_SITE_OTHER): Payer: Medicare Other | Admitting: Internal Medicine

## 2013-05-10 VITALS — BP 111/72 | HR 94 | Temp 98.9°F | Ht 68.0 in

## 2013-05-10 DIAGNOSIS — C50919 Malignant neoplasm of unspecified site of unspecified female breast: Secondary | ICD-10-CM

## 2013-05-10 DIAGNOSIS — R51 Headache: Secondary | ICD-10-CM

## 2013-05-10 DIAGNOSIS — R519 Headache, unspecified: Secondary | ICD-10-CM | POA: Insufficient documentation

## 2013-05-10 MED ORDER — FLUTICASONE PROPIONATE 50 MCG/ACT NA SUSP: 1.0000 | Freq: Every day | NASAL | Status: DC

## 2013-05-10 MED ORDER — FLUTICASONE PROPIONATE 50 MCG/ACT NA SUSP: 2.0000 | Freq: Every day | NASAL | Status: DC

## 2013-05-10 MED ORDER — CETIRIZINE HCL 10 MG PO TABS: 10.0000 mg | ORAL_TABLET | Freq: Every day | ORAL | Status: DC

## 2013-05-10 NOTE — Assessment & Plan Note (Signed)
I believe that patient's HA w/ radiation of pain into her teeth, nasal congestion, cough x 2 days represent acute sinusitis. It is too early to initiate antibiotic therapy as this is perhaps viral in origin. I will attempt to manage symptoms w/ Flonase 1-2 sprays per nare daily and cetirizine 10mg  daily. I asked patient to continue using Tylenol at home for pain relief prn. I asked patient to return to clinic in 1 week if her symptoms do not improve. If this is the case, I will plan to initiate abx therapy at this time.

## 2013-05-10 NOTE — Assessment & Plan Note (Addendum)
Patient with subjective L arm swelling > R for which patient has been rx'd PT in the past. Patient reports having much success with PT for this specific problem. Therefore, I will re-order a referral for ambulatory PT today.

## 2013-05-10 NOTE — Patient Instructions (Signed)
Thank you for visit today! It was a pleasure meeting you.  I believe your headache and nasal congestion represent a sinus infection. I prescribed a steroid nasal spray that you should use 1-2 sprays in each nostril daily. I also prescribed an antihistamine called cetirizine, which you should take 10mg  per day (1 pill per day).  If you are not better or improving in 1 week, please return to clinic. If you do improve with this treatment, please follow up with your PCP, Dr. Aundra Dubin, as previously scheduled in April.

## 2013-05-10 NOTE — Progress Notes (Signed)
Patient ID: [name], female   DOB: 1950-09-20, 63 y.o.   MRN: [id number] HPI The patient is a 64 y.o. female with a history of HTN, diastolic dysfunction, OSA, GERD, L breast ductal carcinoma (s/p B/L mastectomy) who presents for an acute visit.  Patient reports having a constant, dull bilateral temporal HA x 2 days with associated nasal congestion, rhinorrhea. She has been blowing her nose very frequently, producing yellow/green discharge. The head pain radiates into her teeth bilaterally. She has a mild cough that at times produces yellow sputum. Denies ST, ear pain, F/C, SOB, CP, abd pain, N/V/D.   Patient also notes that she has some residual L upper arm swelling that intermittently develops since her B/L mastectomy. She has had success treating this swelling in her L arm with physical therapy. She last saw PT a few weeks ago and is requesting another referral to PT today as she thinks this will help.  ROS: General: no fevers, chills, changes in weight, changes in appetite Skin: no rash HEENT:see HPI Pulm: +cough; no dyspnea, wheezing CV: no chest pain, palpitations, shortness of breath Abd: no abdominal pain, nausea/vomiting, diarrhea/constipation GU: no dysuria Ext: no arthralgias, myalgias Neuro: no weakness, numbness, or tingling  Filed Vitals:   05/10/13 1034  BP: 111/72  Pulse: 94  Temp: 98.9 F (37.2 C)    Physical Exam: General: alert, cooperative, and in no apparent distress HEENT: pupils equal round and reactive to light, vision grossly intact, oropharynx clear and non-erythematous; nasal turbinates are erythematous b/l but not noticeably edematous, no nasal discharge; pain to percussion over maxillary sinuses Neck: supple Lungs: clear to ascultation bilaterally, normal work of respiration, no wheezes, rales, ronchi Heart: regular rate and rhythm, no murmurs, gallops, or rubs Abdomen: soft, non-tender, non-distended, normal bowel sounds Extremities: warm and  well perfused; no pedal edema; there is no visible increased edema to LUE compared to RUE; no palpable LAD to axillae b/l Neurologic: alert & oriented X3, cranial nerves II-XII grossly intact, strength grossly intact, sensation intact to light touch  Current Outpatient Prescriptions on File Prior to Visit  Medication Sig Dispense Refill  . albuterol (PROVENTIL,VENTOLIN) 90 MCG/ACT inhaler Inhale 2 puffs into the lungs every 4 (four) hours as needed. For wheezing      . amLODipine (NORVASC) 5 MG tablet Take 1 tablet (5 mg total) by mouth daily.  30 tablet  5  . aspirin 81 MG tablet Take 81 mg by mouth daily.      . carvedilol (COREG) 6.25 MG tablet Take 1 tablet (6.25 mg total) by mouth 2 (two) times daily with a meal.  180 tablet  1  . cholecalciferol (VITAMIN D) 1000 UNITS tablet Take 1,000 Units by mouth daily.      . ferrous sulfate 325 (65 FE) MG tablet Take 1 tablet (325 mg total) by mouth 3 (three) times daily with meals.  90 tablet  6         . furosemide (LASIX) 40 MG tablet Take 40 mg by mouth as needed (excess fluid).      Marland Kitchen letrozole (FEMARA) 2.5 MG tablet Take 1 tablet (2.5 mg total) by mouth daily.  30 tablet  12  . LORazepam (ATIVAN) 1 MG tablet Take 1 tablet (1 mg total) by mouth daily as needed. For anxiety  30 tablet  5  . Multiple Vitamin (MULTIVITAMIN) tablet Take 1 tablet by mouth daily.       Marland Kitchen olmesartan (BENICAR) 40 MG tablet Take 1 tablet (  40 mg total) by mouth daily.  30 tablet  5  . pantoprazole (PROTONIX) 40 MG tablet Take 1 tablet (40 mg total) by mouth 2 (two) times daily.  60 tablet  1  . simvastatin (ZOCOR) 20 MG tablet Take 1 tablet (20 mg total) by mouth at bedtime.  90 tablet  1  . spironolactone (ALDACTONE) 25 MG tablet Take 1 tablet (25 mg total) by mouth daily.  30 tablet  12  . traMADol (ULTRAM) 50 MG tablet Take 1 tablet (50 mg total) by mouth every 6 (six) hours as needed. For pain  90 tablet  3  . triamcinolone cream (KENALOG) 0.1 % Apply topically 2  (two) times daily as needed.  454 g  0  . venlafaxine XR (EFFEXOR-XR) 37.5 MG 24 hr capsule Take 1 capsule (37.5 mg total) by mouth daily.  30 capsule  6  . UNABLE TO FIND Rx: L8000- Mastectomy Bra (Quantity: 6) [id number]- Mastectomy Forms Replacement, Bilateral (Quantity: 2) Dx: 174.9; Bilateral Mastectomy  1 each  0   Assessment/Plan

## 2013-05-11 NOTE — Progress Notes (Signed)
Case discussed with Dr. Mechele Claude at the time of the visit.  We reviewed the resident's history and exam and pertinent patient test results.  I agree with the assessment, diagnosis, and plan of care documented in the resident's note.

## 2013-05-21 ENCOUNTER — Other Ambulatory Visit: Payer: Self-pay | Admitting: Internal Medicine

## 2013-05-23 ENCOUNTER — Encounter (HOSPITAL_COMMUNITY): Payer: Medicare Other

## 2013-05-25 ENCOUNTER — Encounter: Payer: Self-pay | Admitting: *Deleted

## 2013-05-25 ENCOUNTER — Encounter: Payer: Self-pay | Admitting: Internal Medicine

## 2013-05-25 ENCOUNTER — Ambulatory Visit (INDEPENDENT_AMBULATORY_CARE_PROVIDER_SITE_OTHER): Payer: Medicare Other | Admitting: Internal Medicine

## 2013-05-25 VITALS — BP 129/72 | HR 94 | Temp 98.1°F | Ht 68.0 in | Wt 237.4 lb

## 2013-05-25 DIAGNOSIS — F329 Major depressive disorder, single episode, unspecified: Secondary | ICD-10-CM

## 2013-05-25 DIAGNOSIS — I10 Essential (primary) hypertension: Secondary | ICD-10-CM

## 2013-05-25 DIAGNOSIS — F3289 Other specified depressive episodes: Secondary | ICD-10-CM

## 2013-05-25 MED ORDER — PANTOPRAZOLE SODIUM 40 MG PO TBEC: 40.0000 mg | DELAYED_RELEASE_TABLET | Freq: Two times a day (BID) | ORAL | Status: DC

## 2013-05-25 MED ORDER — SIMETHICONE 125 MG PO CAPS: 125.0000 mg | ORAL_CAPSULE | Freq: Four times a day (QID) | ORAL | Status: DC | PRN

## 2013-05-25 NOTE — Progress Notes (Signed)
Subjective:     Patient ID: [name], female   DOB: 12-Mar-1951, 63 y.o.   MRN: [id number]  HPI The patient is a 63 YO female presenting acutely for stomach pain. She has PMH of edema s/p bilateral mastectomy for breast cancer in 2011, depression, GERD, HTN. The stomach pain was going on for about 2 weeks however has since resolved. She does need several refills however. She feels that it is related to gas and bloating. She has had this problem throughout the years although this recent bout was lasting longer than usual. She is not having abdominal pain now. She is not nausea or vomiting. She is not constipated or having diarrhea. Her stools have not changed in caliber, color. She does not notice any blood. Her frequency has not changed with her stools. She is not having any other complaints. Her daughter is just out of the hospital and she is caring for her recently. She is not taking the allergy medicine anymore as it helped with her congestion and she does not have it any longer.    Review of Systems  Constitutional: Negative for fever, chills, diaphoresis, activity change, appetite change, fatigue and unexpected weight change.  HENT: Negative for congestion, dental problem, drooling, ear discharge, ear pain, facial swelling, hearing loss, mouth sores, nosebleeds, postnasal drip, rhinorrhea, sinus pressure, sneezing, sore throat, tinnitus, trouble swallowing and voice change.   Respiratory: Negative for cough, choking, chest tightness, shortness of breath and wheezing.   Cardiovascular: Negative for chest pain, palpitations and leg swelling.  Gastrointestinal: Positive for abdominal pain. Negative for nausea, vomiting, diarrhea, constipation and abdominal distention.       Resolved currently  Endocrine: Negative.   Musculoskeletal: Negative for neck pain and neck stiffness.  Skin: Negative for color change, pallor, rash and wound.  Neurological: Negative for dizziness, tremors, syncope,  speech difficulty, weakness, light-headedness, numbness and headaches.       Objective:   Physical Exam  Constitutional: She is oriented to person, place, and time. She appears well-developed and well-nourished. No distress.  HENT:  Head: Normocephalic and atraumatic.  Eyes: EOM are normal. Pupils are equal, round, and reactive to light.  Neck: Normal range of motion. Neck supple. No JVD present. No tracheal deviation present. No thyromegaly present.  Cardiovascular: Normal rate and regular rhythm.   No murmur heard. Pulmonary/Chest: Effort normal and breath sounds normal. No respiratory distress. She has no wheezes. She has no rales.  S/P bilateral mastectomy and not wearing implant today  Abdominal: Soft. Bowel sounds are normal. She exhibits no distension. There is no tenderness. There is no rebound.  Neurological: She is alert and oriented to person, place, and time. No cranial nerve deficit.  Skin: Skin is warm and dry. She is not diaphoretic.       Assessment/Plan:    1. Bloating - Rx for simethicone to help. Advised her of food and beverages to avoid and that may be helpful.   2. Please see problem oriented charting.  3. Disposition - Patient will return in 3-4 months for check up. No labs today. Refilled her protonix.

## 2013-05-25 NOTE — Assessment & Plan Note (Signed)
BP Readings from Last 3 Encounters:  05/25/13 129/72  05/10/13 111/72  03/31/13 150/87    Lab Results  Component Value Date   NA 143 01/20/2013   K 4.6 01/20/2013   CREATININE 1.3* 01/20/2013    Assessment: Blood pressure control: controlled Progress toward BP goal:  at goal Comments:   Plan: Medications:  continue current medications, amlodipine 5 mg daily, coreg 6.25 mg BID, lasix 40 mg prn (takes once per week on average), spironolactone 25 mg daily, benicar 40 mg daily Educational resources provided: brochure Self management tools provided: home blood pressure logbook Other plans:

## 2013-05-25 NOTE — Assessment & Plan Note (Signed)
Never took effexor although she filled it. Given its withdrawal side effects advised her to not take it and if she was ready to take something a different anti-depressant could be prescribed. She is in understanding.

## 2013-05-25 NOTE — Patient Instructions (Signed)
We will have you try some simethicone for your bloating. This can be taken up to 4 times per day when you need it. Taking it before meals can help to decrease the amount of bloating and gas you have with meals.   Come back in 3-4 months for a regular check up with your doctor.   Call us if you are having problems at [phone].  I hope that your daughter is doing better soon.  Bloating Bloating is the feeling of fullness in your belly. You may feel as though your pants are too tight. Often the cause of bloating is overeating, retaining fluids, or having gas in your bowel. It is also caused by swallowing air and eating foods that cause gas. Irritable bowel syndrome is one of the most common causes of bloating. Constipation is also a common cause. Sometimes more serious problems can cause bloating. SYMPTOMS  Usually there is a feeling of fullness, as though your abdomen is bulged out. There may be mild discomfort.  DIAGNOSIS  Usually no particular testing is necessary for most bloating. If the condition persists and seems to become worse, your caregiver may do additional testing.  TREATMENT   There is no direct treatment for bloating.  Do not put gas into the bowel. Avoid chewing gum and sucking on candy. These tend to make you swallow air. Swallowing air can also be a nervous habit. Try to avoid this.  Avoiding high residue diets will help. Eat foods with soluble fibers (examples include root vegetables, apples, or barley) and substitute dairy products with soy and rice products. This helps irritable bowel syndrome.  If constipation is the cause, then a high residue diet with more fiber will help.  Avoid carbonated beverages.  Over-the-counter preparations are available that help reduce gas. Your pharmacist can help you with this. SEEK MEDICAL CARE IF:   Bloating continues and seems to be getting worse.  You notice a weight gain.  You have a weight loss but the bloating is getting  worse.  You have changes in your bowel habits or develop nausea or vomiting. SEEK IMMEDIATE MEDICAL CARE IF:   You develop shortness of breath or swelling in your legs.  You have an increase in abdominal pain or develop chest pain. Document Released: 01/07/2006 Document Revised: 06/01/2011 Document Reviewed: 02/25/2007 [facility] Patient Information 2014 [place].

## 2013-05-28 ENCOUNTER — Other Ambulatory Visit: Payer: Self-pay | Admitting: Oncology

## 2013-05-28 ENCOUNTER — Other Ambulatory Visit: Payer: Self-pay | Admitting: Internal Medicine

## 2013-05-29 NOTE — Progress Notes (Signed)
Case discussed with Dr. Kollar soon after the resident saw the patient.  We reviewed the resident's history and exam and pertinent patient test results.  I agree with the assessment, diagnosis, and plan of care documented in the resident's note. 

## 2013-05-29 NOTE — Telephone Encounter (Signed)
Both Rx called in to pharmacy. Hilda Blades Amiera Herzberg RN 05/29/13 12:10PM

## 2013-06-01 ENCOUNTER — Ambulatory Visit (INDEPENDENT_AMBULATORY_CARE_PROVIDER_SITE_OTHER): Payer: Medicare Other | Admitting: General Surgery

## 2013-06-01 ENCOUNTER — Encounter (INDEPENDENT_AMBULATORY_CARE_PROVIDER_SITE_OTHER): Payer: Self-pay | Admitting: General Surgery

## 2013-06-01 VITALS — BP 132/80 | HR 77 | Temp 98.2°F | Ht 68.0 in | Wt 238.0 lb

## 2013-06-01 DIAGNOSIS — L989 Disorder of the skin and subcutaneous tissue, unspecified: Secondary | ICD-10-CM

## 2013-06-01 DIAGNOSIS — C50919 Malignant neoplasm of unspecified site of unspecified female breast: Secondary | ICD-10-CM

## 2013-06-01 DIAGNOSIS — Z853 Personal history of malignant neoplasm of breast: Secondary | ICD-10-CM

## 2013-06-01 DIAGNOSIS — D235 Other benign neoplasm of skin of trunk: Secondary | ICD-10-CM

## 2013-06-01 NOTE — Progress Notes (Signed)
Chief complaint: Followup breast cancer and painful skin lesion lower back  History: Patient returns for followup with history of stage II A. ER/PR positive HER-2/neu positive invasive ductal carcinoma of the left breast diagnosed October 2011. Patient is status post bilateral mastectomies. She has completed Herceptin it is on letrozole. She reports some mild swelling around the left axilla. No unusual pain or skin changes. She does however have a somewhat painful skin lesion this several weeks' duration on her lower back.  Past Medical History  Diagnosis Date  . Depression     seasonal melancholia  . Hyperlipidemia   . Hypertension   . Myopathy     not statin related, cause unknown   . Back pain   . Edema extremities   . Chemotherapy follow-up examination 03/06/2011  . Cough 03/06/2011  . Arthritis     DJD, lumbar spondylosis  . Bronchitis 03/10/2012  . Asthma   . GERD (gastroesophageal reflux disease)   . Cataract   . Breast cancer     left breast invasive ductal ca in remission as of 10/07/11 s/p double mastectomy   Past Surgical History  Procedure Laterality Date  . Abdominal hysterectomy      for fibroids   . Laminectomy      C5-6 Dr. Louanne Skye 2005   . Oophorectomy    . Mastectomy      bilateral   . Tonsillectomy    . Appendectomy    . Incontinence surgery    . Breast surgery  2011    Bilateral mastectomy   Current Outpatient Prescriptions  Medication Sig Dispense Refill  . albuterol (PROVENTIL,VENTOLIN) 90 MCG/ACT inhaler Inhale 2 puffs into the lungs every 4 (four) hours as needed. For wheezing      . amLODipine (NORVASC) 5 MG tablet Take 1 tablet (5 mg total) by mouth daily.  30 tablet  5  . aspirin 81 MG tablet Take 81 mg by mouth daily.      . carvedilol (COREG) 6.25 MG tablet Take 1 tablet (6.25 mg total) by mouth 2 (two) times daily with a meal.  180 tablet  1  . cholecalciferol (VITAMIN D) 1000 UNITS tablet Take 1,000 Units by mouth daily.      . ferrous  sulfate 325 (65 FE) MG tablet Take 1 tablet (325 mg total) by mouth 3 (three) times daily with meals.  90 tablet  6  . Fluticasone-Salmeterol (ADVAIR) 100-50 MCG/DOSE AEPB Inhale 1 puff into the lungs 2 (two) times daily as needed (wheezing).       . furosemide (LASIX) 40 MG tablet Take 40 mg by mouth as needed (excess fluid).      Marland Kitchen letrozole (FEMARA) 2.5 MG tablet TAKE 1 TABLET (2.5 MG TOTAL) BY MOUTH DAILY.  30 tablet  12  . LORazepam (ATIVAN) 1 MG tablet TAKE 1 TABLET BY MOUTH DAILY AS NEEDED FOR ANXIETY  30 tablet  1  . Multiple Vitamin (MULTIVITAMIN) tablet Take 1 tablet by mouth daily.       Marland Kitchen olmesartan (BENICAR) 40 MG tablet Take 1 tablet (40 mg total) by mouth daily.  30 tablet  5  . pantoprazole (PROTONIX) 40 MG tablet Take 1 tablet (40 mg total) by mouth 2 (two) times daily.  60 tablet  3  . Simethicone 125 MG CAPS Take 1 capsule (125 mg total) by mouth 4 (four) times daily as needed (gas, bloating).  90 each  2  . simvastatin (ZOCOR) 20 MG tablet Take 1 tablet (20  mg total) by mouth at bedtime.  90 tablet  1  . spironolactone (ALDACTONE) 25 MG tablet Take 1 tablet (25 mg total) by mouth daily.  30 tablet  12  . traMADol (ULTRAM) 50 MG tablet Take 1 tablet (50 mg total) by mouth every 6 (six) hours as needed. For pain  90 tablet  3  . traMADol (ULTRAM) 50 MG tablet TAKE 1 TABLET (50 MG TOTAL) BY MOUTH EVERY 6 (SIX) HOURS AS NEEDED. FOR PAIN  120 tablet  0  . UNABLE TO FIND Rx: L8000- Mastectomy Bra (Quantity: 6) [id number]- Mastectomy Forms Replacement, Bilateral (Quantity: 2) Dx: 174.9; Bilateral Mastectomy  1 each  0  . triamcinolone cream (KENALOG) 0.1 % Apply topically 2 (two) times daily as needed.  454 g  0   No current facility-administered medications for this visit.   Facility-Administered Medications Ordered in Other Visits  Medication Dose Route Frequency Provider Last Rate Last Dose  . sodium chloride 0.9 % injection 10 mL  10 mL Intravenous PRN Deatra Robinson, MD   10 mL at  01/26/11 1035    Exam: BP 132/80  Pulse 77  Temp(Src) 98.2 F (36.8 C) (Oral)  Ht $R'5\' 8"'RR$  (1.727 m)  Wt 238 lb (107.956 kg)  BMI 36.20 kg/m2  LMP 12/31/1968 General: Appears well Lymph nodes: No cervical, supraclavicular or axillary nodes palpable Chest wall: Both mastectomy wounds well healed. No chest wall masses or chest wall skin changes. I cannot detect any significant swelling in the left axilla or upper arm. Skin: In the lower left back is a raised slightly tender papillary skin lesion measuring about 4 mm in diameter. Extremities: I cannot detect any swelling in the hands or arms  Assessment and plan: Doing well following treatment for breast cancer as above with no clinical evidence of recurrence. She may have some mild lymphedema of the left upper extremity. She is scheduled for physical therapy. Painful skin lesion left lower back. I recommended excision and she is in agreement. Under local anesthesia this was excised and sent for permanent pathology.  Return 6 months.

## 2013-06-01 NOTE — Addendum Note (Signed)
Addended by: Ivor Costa on: 06/01/2013 10:07 AM   Modules accepted: Orders

## 2013-06-02 ENCOUNTER — Encounter: Payer: Self-pay | Admitting: Internal Medicine

## 2013-06-02 ENCOUNTER — Ambulatory Visit (INDEPENDENT_AMBULATORY_CARE_PROVIDER_SITE_OTHER): Payer: Medicare Other | Admitting: Internal Medicine

## 2013-06-02 VITALS — BP 134/80 | HR 75 | Temp 97.6°F | Ht 68.0 in | Wt 241.4 lb

## 2013-06-02 DIAGNOSIS — G4733 Obstructive sleep apnea (adult) (pediatric): Secondary | ICD-10-CM

## 2013-06-02 NOTE — Progress Notes (Signed)
Subjective:     Patient ID: [name], female   DOB: 06-07-1950, 63 y.o.   MRN: [id number]  HPI The patient is a 63 YO female presenting acutely for stomach pain after visit 1 week ago for stomach pain. She was seen at surgeon's office yesterday without complaint of stomach pain. She did have a skin lesion on her pain excised yesterday. She has PMH of edema s/p bilateral mastectomy for breast cancer in 2011, depression, GERD, HTN. The stomach is still the same as previous visit. She filled the gas-x tabs and those have been working for her gas but she still has some residual gas.  She is not having abdominal pain now. She is not nausea or vomiting. She is not constipated or having diarrhea. Her stools have not changed in caliber, color. She does not notice any blood. Her frequency has not changed with her stools. She is not having any other complaints. She is having some dreams and more complaints from her daughter about loud breathing.   Review of Systems  Constitutional: Negative for fever, chills, diaphoresis, activity change, appetite change, fatigue and unexpected weight change.  HENT: Negative for congestion, dental problem, drooling, ear discharge, ear pain, facial swelling, hearing loss, mouth sores, nosebleeds, postnasal drip, rhinorrhea, sinus pressure, sneezing, sore throat, tinnitus, trouble swallowing and voice change.   Respiratory: Negative for cough, choking, chest tightness, shortness of breath and wheezing.   Cardiovascular: Negative for chest pain, palpitations and leg swelling.  Gastrointestinal: Positive for abdominal pain. Negative for nausea, vomiting, diarrhea, constipation and abdominal distention.       Resolved currently  Endocrine: Negative.   Musculoskeletal: Negative for neck pain and neck stiffness.  Skin: Negative for color change, pallor, rash and wound.  Neurological: Negative for dizziness, tremors, syncope, speech difficulty, weakness, light-headedness,  numbness and headaches.       Objective:   Physical Exam  Constitutional: She is oriented to person, place, and time. She appears well-developed and well-nourished. No distress.  HENT:  Head: Normocephalic and atraumatic.  Eyes: EOM are normal. Pupils are equal, round, and reactive to light.  Neck: Normal range of motion. Neck supple. No JVD present. No tracheal deviation present. No thyromegaly present.  Cardiovascular: Normal rate and regular rhythm.   No murmur heard. Pulmonary/Chest: Effort normal and breath sounds normal. No respiratory distress. She has no wheezes. She has no rales.  S/P bilateral mastectomy and not wearing implant today  Abdominal: Soft. Bowel sounds are normal. She exhibits no distension. There is no tenderness. There is no rebound.  Neurological: She is alert and oriented to person, place, and time. No cranial nerve deficit.  Skin: Skin is warm and dry. She is not diaphoretic.       Assessment/Plan:    1. Abdominal pain -  Advised increased fiber intake, continued usage of gas-x, avoid carbonated beverages, walking 20-30 minutes per day.  2. Please see problem oriented charting.  3. Disposition - Patient will return in 3-4 months for check up. No labs today.

## 2013-06-02 NOTE — Assessment & Plan Note (Addendum)
Per patient she has the money to buy the CPAP after the first of April and advised that this would be worthwhile. Will forward to PCP so the order for CPAP can be placed. Advised avoiding sedatives at night time.

## 2013-06-02 NOTE — Patient Instructions (Signed)
Work on increasing the amount of fiber in your diet to help decrease the gas. Also work on walking for about 20-30 minutes everyday to help decrease the amount of gas in your stomach.   Avoid sodas, pops, cokes, or carbonated beverages as these increase the amount of gas in your stomach and colon.   When you can afford it get the cpap machine to help with your breathing at night time.  Ask Dr. Humphrey Rolls about the hot flashes and about the swelling and sleeping.   Come back as previously scheduled and call if you have problems sooner at [phone].  Bloating Bloating is the feeling of fullness in your belly. You may feel as though your pants are too tight. Often the cause of bloating is overeating, retaining fluids, or having gas in your bowel. It is also caused by swallowing air and eating foods that cause gas. Irritable bowel syndrome is one of the most common causes of bloating. Constipation is also a common cause. Sometimes more serious problems can cause bloating. SYMPTOMS  Usually there is a feeling of fullness, as though your abdomen is bulged out. There may be mild discomfort.  DIAGNOSIS  Usually no particular testing is necessary for most bloating. If the condition persists and seems to become worse, your caregiver may do additional testing.  TREATMENT   There is no direct treatment for bloating.  Do not put gas into the bowel. Avoid chewing gum and sucking on candy. These tend to make you swallow air. Swallowing air can also be a nervous habit. Try to avoid this.  Avoiding high residue diets will help. Eat foods with soluble fibers (examples include root vegetables, apples, or barley) and substitute dairy products with soy and rice products. This helps irritable bowel syndrome.  If constipation is the cause, then a high residue diet with more fiber will help.  Avoid carbonated beverages.  Over-the-counter preparations are available that help reduce gas. Your pharmacist can help you  with this. SEEK MEDICAL CARE IF:   Bloating continues and seems to be getting worse.  You notice a weight gain.  You have a weight loss but the bloating is getting worse.  You have changes in your bowel habits or develop nausea or vomiting. SEEK IMMEDIATE MEDICAL CARE IF:   You develop shortness of breath or swelling in your legs.  You have an increase in abdominal pain or develop chest pain. Document Released: 01/07/2006 Document Revised: 06/01/2011 Document Reviewed: 02/25/2007 [facility] Patient Information 2014 [employer].  Sleep Apnea Sleep apnea is disorder that affects a person's sleep. A person with sleep apnea has abnormal pauses in their breathing when they sleep. It is hard for them to get a good sleep. This makes a person tired during the day. It also can lead to other physical problems. There are three types of sleep apnea. One type is when breathing stops for a short time because your airway is blocked (obstructive sleep apnea). Another type is when the brain sometimes fails to give the normal signal to breathe to the muscles that control your breathing (central sleep apnea). The third type is a combination of the other two types. HOME CARE  Do not sleep on your back. Try to sleep on your side.  Take all medicine as told by your doctor.  Avoid alcohol, calming medicines (sedatives), and depressant drugs.  Try to lose weight if you are overweight. Talk to your doctor about a healthy weight goal. Your doctor may have you use  a device that helps to open your airway. It can help you get the air that you need. It is called a positive airway pressure (PAP) device. There are three types of PAP devices:  Continuous positive airway pressure (CPAP) device.  Nasal expiratory positive airway pressure (EPAP) device.  Bilevel positive airway pressure (BPAP) device. MAKE SURE YOU:  Understand these instructions.  Will watch your condition.  Will get help right away if you  are not doing well or get worse. Document Released: 12/17/2007 Document Revised: 02/24/2012 Document Reviewed: 07/11/2011 [facility] Patient Information 2014 [place].

## 2013-06-07 ENCOUNTER — Telehealth (INDEPENDENT_AMBULATORY_CARE_PROVIDER_SITE_OTHER): Payer: Self-pay

## 2013-06-07 ENCOUNTER — Encounter (HOSPITAL_COMMUNITY): Payer: Self-pay

## 2013-06-07 ENCOUNTER — Ambulatory Visit (HOSPITAL_COMMUNITY)
Admission: RE | Admit: 2013-06-07 | Discharge: 2013-06-07 | Disposition: A | Discharge status: Home / Self Care | Payer: Medicare Other | Source: Ambulatory Visit | Attending: Internal Medicine | Admitting: Internal Medicine

## 2013-06-07 VITALS — BP 142/78 | HR 86 | Wt 240.4 lb

## 2013-06-07 DIAGNOSIS — C50919 Malignant neoplasm of unspecified site of unspecified female breast: Secondary | ICD-10-CM | POA: Insufficient documentation

## 2013-06-07 DIAGNOSIS — I519 Heart disease, unspecified: Secondary | ICD-10-CM | POA: Insufficient documentation

## 2013-06-07 DIAGNOSIS — R079 Chest pain, unspecified: Secondary | ICD-10-CM | POA: Insufficient documentation

## 2013-06-07 MED ORDER — POTASSIUM CHLORIDE CRYS ER 20 MEQ PO TBCR: 20.0000 meq | EXTENDED_RELEASE_TABLET | Freq: Every day | ORAL | Status: DC

## 2013-06-07 NOTE — Telephone Encounter (Signed)
Message copied by Ivor Costa on Wed Jun 07, 2013  4:06 PM ------      Message from: [name] T      Created: Wed Jun 07, 2013  3:48 PM       Please let pt know skin lesion was benign mole.  Thanks       ------

## 2013-06-07 NOTE — Progress Notes (Signed)
HPI: [name] is a 63 y/o women with h/o obesity, HTN, asthma and HL. She also has history of TIA in April 2012.    Found to have L ductal breast CA. ER/PR +. HER 2 neu was equivocal. s/p bilateral mastectomies in 01/2010. Lymph nodes negative.   She completed herceptin therapy Jan. 2013.  Echo April 10, 2010 EF 55-60%. Grade 1 diastolic dusfunction. MUGA 2/12 EF 73%  Echo April 9,2012 EF 60-65% lat s' 10.43 (? Artifact) Echo 12/13/10 60-65% poor windows for lat s' peak I see is 8.09 ECHO 02/24/12 EF 55-60% lateral s' 6.5 ECHO 07/02/11 EF 55-60% lateral S' 6.9 RV mildly dilated but normal function Echo 05/30/2012: EF 60-65% lat s' 7.1, Grade 1 diastolic dysfunction September 2012 we changed HCTZ to lasix for longstanding LE edema (since her daughter was born in 39).   At last visit had CP and underwent Lexiscan Myoview which was normal. Feels good. No more CP or dyspnea. + edema. Has not been very active lately. Has not taken lasix for swelling yet.    ROS: All systems negative except as listed in HPI, PMH and Problem List.  Past Medical History  Diagnosis Date  . Depression     seasonal melancholia  . Hyperlipidemia   . Hypertension   . Myopathy     not statin related, cause unknown   . Back pain   . Edema extremities   . Chemotherapy follow-up examination 03/06/2011  . Cough 03/06/2011  . Arthritis     DJD, lumbar spondylosis  . Bronchitis 03/10/2012  . Asthma   . GERD (gastroesophageal reflux disease)   . Cataract   . Breast cancer     left breast invasive ductal ca in remission as of 10/07/11 s/p double mastectomy   Current Outpatient Prescriptions on File Prior to Encounter  Medication Sig Dispense Refill  . albuterol (PROVENTIL,VENTOLIN) 90 MCG/ACT inhaler Inhale 2 puffs into the lungs every 4 (four) hours as needed. For wheezing      . amLODipine (NORVASC) 5 MG tablet Take 1 tablet (5 mg total) by mouth daily.  30 tablet  5  . aspirin 81 MG tablet Take 81 mg by mouth  daily.      . carvedilol (COREG) 6.25 MG tablet Take 1 tablet (6.25 mg total) by mouth 2 (two) times daily with a meal.  180 tablet  1  . cholecalciferol (VITAMIN D) 1000 UNITS tablet Take 1,000 Units by mouth daily.      . ferrous sulfate 325 (65 FE) MG tablet Take 1 tablet (325 mg total) by mouth 3 (three) times daily with meals.  90 tablet  6  . Fluticasone-Salmeterol (ADVAIR) 100-50 MCG/DOSE AEPB Inhale 1 puff into the lungs 2 (two) times daily as needed (wheezing).       . furosemide (LASIX) 40 MG tablet Take 40 mg by mouth as needed (excess fluid).      Marland Kitchen letrozole (FEMARA) 2.5 MG tablet TAKE 1 TABLET (2.5 MG TOTAL) BY MOUTH DAILY.  30 tablet  12  . LORazepam (ATIVAN) 1 MG tablet TAKE 1 TABLET BY MOUTH DAILY AS NEEDED FOR ANXIETY  30 tablet  1  . Multiple Vitamin (MULTIVITAMIN) tablet Take 1 tablet by mouth daily.       Marland Kitchen olmesartan (BENICAR) 40 MG tablet Take 1 tablet (40 mg total) by mouth daily.  30 tablet  5  . pantoprazole (PROTONIX) 40 MG tablet Take 1 tablet (40 mg total) by mouth 2 (two) times daily.  60 tablet  3  . Simethicone 125 MG CAPS Take 1 capsule (125 mg total) by mouth 4 (four) times daily as needed (gas, bloating).  90 each  2  . simvastatin (ZOCOR) 20 MG tablet Take 1 tablet (20 mg total) by mouth at bedtime.  90 tablet  1  . spironolactone (ALDACTONE) 25 MG tablet Take 1 tablet (25 mg total) by mouth daily.  30 tablet  12  . traMADol (ULTRAM) 50 MG tablet Take 1 tablet (50 mg total) by mouth every 6 (six) hours as needed. For pain  90 tablet  3  . triamcinolone cream (KENALOG) 0.1 % Apply topically 2 (two) times daily as needed.  454 g  0  . UNABLE TO FIND Rx: L8000- Mastectomy Bra (Quantity: 6) [id number] Mastectomy Forms Replacement, Bilateral (Quantity: 2) Dx: 174.9; Bilateral Mastectomy  1 each  0   Current Facility-Administered Medications on File Prior to Encounter  Medication Dose Route Frequency Provider Last Rate Last Dose  . sodium chloride 0.9 % injection 10 mL   10 mL Intravenous PRN Deatra Robinson, MD   10 mL at 01/26/11 1035    Filed Vitals:   06/07/13 1341  BP: 142/78  Pulse: 86  Weight: 240 lb 6.4 oz (109.045 kg)  SpO2: 98%   PHYSICAL EXAM: General:  Well appearing. No resp difficulty; daughters present HEENT: normal Neck: supple. JVP 8-9. Carotids 2+ bilaterally; no bruits. No lymphadenopathy or thryomegaly appreciated. Cor: PMI normal. Regular rate & rhythm. No rubs, murmurs; tender to palpate chest on R side Lungs: clear Abdomen: obese, soft, nontender, nondistended. No hepatosplenomegaly. No bruits or masses. Good bowel sounds. Extremities: no cyanosis, clubbing, rash.  2+ lower extremity edema Neuro: alert & orientedx3, cranial nerves grossly intact. Moves all 4 extremities w/o difficulty. Affect pleasant.  ASSESSMENT & PLAN:  1) CP- Lexi Scan Myoview. Was negative. CP resolved. No further work-up at this point.  2) Chronic diastolic HF - has a significant amount of fluid on board today. Will take lasix for the next 2-3 days and then as needed to keep fluid off. Be careful with salt and fluid intake. Will give her potassium to take with lasix 3) Breast cancer - finished chemo now in remission.   F/u 6 months.    [name] MD 2:28 PM

## 2013-06-07 NOTE — Addendum Note (Signed)
Encounter addended by: Scarlette Calico, RN on: 06/07/2013  2:39 PM<BR>     Documentation filed: Patient Instructions Section, Orders

## 2013-06-07 NOTE — Patient Instructions (Signed)
Take Potassium (k-dur) 20 meq when you take Lasix (Furosemide)  We will contact you in 6 months to schedule your next appointment.

## 2013-06-07 NOTE — Telephone Encounter (Signed)
Called and spoke to patient to make aware that skin lesion was Benign Mole.  Patient verbalized understanding.

## 2013-06-08 ENCOUNTER — Other Ambulatory Visit: Payer: Self-pay | Admitting: Internal Medicine

## 2013-06-08 DIAGNOSIS — G4733 Obstructive sleep apnea (adult) (pediatric): Secondary | ICD-10-CM

## 2013-06-08 NOTE — Progress Notes (Signed)
Case discussed with Dr. Kollar at the time of the visit.  We reviewed the resident's history and exam and pertinent patient test results.  I agree with the assessment, diagnosis, and plan of care documented in the resident's note.     

## 2013-06-18 ENCOUNTER — Other Ambulatory Visit: Payer: Self-pay | Admitting: Internal Medicine

## 2013-06-18 DIAGNOSIS — I10 Essential (primary) hypertension: Secondary | ICD-10-CM

## 2013-06-19 ENCOUNTER — Ambulatory Visit: Payer: Medicare Other | Attending: Internal Medicine | Admitting: Physical Therapy

## 2013-06-19 DIAGNOSIS — C50919 Malignant neoplasm of unspecified site of unspecified female breast: Secondary | ICD-10-CM | POA: Insufficient documentation

## 2013-06-19 DIAGNOSIS — M25519 Pain in unspecified shoulder: Secondary | ICD-10-CM | POA: Insufficient documentation

## 2013-06-19 DIAGNOSIS — IMO0001 Reserved for inherently not codable concepts without codable children: Secondary | ICD-10-CM | POA: Insufficient documentation

## 2013-06-19 DIAGNOSIS — I890 Lymphedema, not elsewhere classified: Secondary | ICD-10-CM | POA: Insufficient documentation

## 2013-06-25 ENCOUNTER — Other Ambulatory Visit: Payer: Self-pay | Admitting: Internal Medicine

## 2013-06-26 ENCOUNTER — Ambulatory Visit: Payer: Medicare Other | Attending: Internal Medicine | Admitting: Physical Therapy

## 2013-06-26 DIAGNOSIS — C50919 Malignant neoplasm of unspecified site of unspecified female breast: Secondary | ICD-10-CM | POA: Insufficient documentation

## 2013-06-26 DIAGNOSIS — I890 Lymphedema, not elsewhere classified: Secondary | ICD-10-CM | POA: Insufficient documentation

## 2013-06-26 DIAGNOSIS — M25519 Pain in unspecified shoulder: Secondary | ICD-10-CM | POA: Insufficient documentation

## 2013-06-26 DIAGNOSIS — IMO0001 Reserved for inherently not codable concepts without codable children: Secondary | ICD-10-CM | POA: Insufficient documentation

## 2013-06-28 ENCOUNTER — Ambulatory Visit: Payer: Medicare Other | Admitting: Physical Therapy

## 2013-06-29 ENCOUNTER — Other Ambulatory Visit: Payer: Self-pay | Admitting: *Deleted

## 2013-06-29 ENCOUNTER — Encounter: Payer: Medicare Other | Admitting: Internal Medicine

## 2013-06-29 MED ORDER — FLUTICASONE-SALMETEROL 100-50 MCG/DOSE IN AEPB: 1.0000 | INHALATION_SPRAY | Freq: Two times a day (BID) | RESPIRATORY_TRACT | Status: DC | PRN

## 2013-06-29 MED ORDER — ALBUTEROL SULFATE HFA 108 (90 BASE) MCG/ACT IN AERS: 1.0000 | INHALATION_SPRAY | Freq: Four times a day (QID) | RESPIRATORY_TRACT | Status: DC | PRN

## 2013-06-29 NOTE — Telephone Encounter (Signed)
Pt requesting refill on Advair and Proventil. Thanks

## 2013-07-03 ENCOUNTER — Ambulatory Visit: Payer: Medicare Other | Admitting: Physical Therapy

## 2013-07-05 ENCOUNTER — Ambulatory Visit: Payer: Medicare Other | Admitting: Physical Therapy

## 2013-07-07 ENCOUNTER — Ambulatory Visit (INDEPENDENT_AMBULATORY_CARE_PROVIDER_SITE_OTHER): Payer: Medicare Other | Admitting: Internal Medicine

## 2013-07-07 ENCOUNTER — Encounter: Payer: Self-pay | Admitting: Internal Medicine

## 2013-07-07 VITALS — BP 139/84 | HR 80 | Temp 97.8°F | Ht 68.0 in | Wt 242.2 lb

## 2013-07-07 DIAGNOSIS — I10 Essential (primary) hypertension: Secondary | ICD-10-CM

## 2013-07-07 DIAGNOSIS — N183 Chronic kidney disease, stage 3 unspecified: Secondary | ICD-10-CM

## 2013-07-07 DIAGNOSIS — R300 Dysuria: Secondary | ICD-10-CM

## 2013-07-07 DIAGNOSIS — G4733 Obstructive sleep apnea (adult) (pediatric): Secondary | ICD-10-CM

## 2013-07-07 DIAGNOSIS — I129 Hypertensive chronic kidney disease with stage 1 through stage 4 chronic kidney disease, or unspecified chronic kidney disease: Secondary | ICD-10-CM

## 2013-07-07 DIAGNOSIS — I872 Venous insufficiency (chronic) (peripheral): Secondary | ICD-10-CM

## 2013-07-07 LAB — BASIC METABOLIC PANEL WITH GFR
BUN: 18 mg/dL (ref 6–23)
CO2: 29 mEq/L (ref 19–32)
Calcium: 9.9 mg/dL (ref 8.4–10.5)
Chloride: 105 mEq/L (ref 96–112)
Creat: 1.21 mg/dL — ABNORMAL HIGH (ref 0.50–1.10)
GFR, Est African American: 55 mL/min — ABNORMAL LOW
GFR, Est Non African American: 47 mL/min — ABNORMAL LOW
Glucose, Bld: 91 mg/dL (ref 70–99)
Potassium: 3.7 mEq/L (ref 3.5–5.3)
Sodium: 141 mEq/L (ref 135–145)

## 2013-07-07 MED ORDER — CIPROFLOXACIN HCL 500 MG PO TABS: 500.0000 mg | ORAL_TABLET | Freq: Two times a day (BID) | ORAL | Status: DC

## 2013-07-07 MED ORDER — PHENAZOPYRIDINE HCL 100 MG PO TABS: 100.0000 mg | ORAL_TABLET | Freq: Three times a day (TID) | ORAL | Status: DC | PRN

## 2013-07-07 MED ORDER — FLUCONAZOLE 150 MG PO TABS: 150.0000 mg | ORAL_TABLET | Freq: Once | ORAL | Status: DC

## 2013-07-07 NOTE — Progress Notes (Signed)
   Subjective:    Patient ID: [name], female    DOB: 03-01-1951, 63 y.o.   MRN: [id number]  HPI Comments: 63 y.o PMH Depression, Hyperlipidemia (LDL 89 08/2012), Hypertension (controlled BP 139/84)), myopathy cause unknown, chronic Back pain 2/2 DJD, lumbar spondylosis, chronic knee pain, chronic lower extremity edema, on chemo (i.e Femara) s/p b/l masectomy for left breast cancer, Bronchitis, Asthma, GERD, Cataract, hemorrhoids, grade 1 diastolic dysfunction (EF 0000000) has followed with Dr. Sung Amabile in the past), OSA (not of cpap yet due to affordability), plantar fascitis, chronic venous insufficiency, ext hemmrhoids, genital herpes, h/o IJ vein thrombosis, prediabetes, CKD, stasis dermatitis, h/o E.Coli UTI, and urinary incontinence.   She presents for:  1) She thinks she has either yeast or UTI as she has had burning with and after urination for a few days and worsening of baseline urinary incontinence.  She has not noted any discharge in the vaginal area and declines a vaginal exam today.        Review of Systems  Respiratory: Negative for shortness of breath.   Cardiovascular: Positive for leg swelling. Negative for chest pain.  Gastrointestinal: Negative for abdominal pain.  Genitourinary: Positive for dysuria. Negative for vaginal discharge.       +urinary incontinence        Objective:   Physical Exam  Nursing note and vitals reviewed. Constitutional: She is oriented to person, place, and time. Vital signs are normal. She appears well-developed and well-nourished. She is cooperative. No distress.  HENT:  Head: Normocephalic and atraumatic.  Mouth/Throat: Oropharynx is clear and moist and mucous membranes are normal. No oropharyngeal exudate.  Eyes: Conjunctivae are normal. Pupils are equal, round, and reactive to light. Right eye exhibits no discharge. Left eye exhibits no discharge. No scleral icterus.  Cardiovascular: Normal rate, regular rhythm, S1 normal, S2  normal and normal heart sounds.   No murmur heard. Trace lower ext edema   Pulmonary/Chest: Effort normal and breath sounds normal. No respiratory distress. She has no wheezes.  Abdominal: Soft. Bowel sounds are normal. There is no tenderness.  Obese ab  Neurological: She is alert and oriented to person, place, and time. Gait normal.  Skin: Skin is warm and dry. No rash noted. She is not diaphoretic.  Psychiatric: She has a normal mood and affect. Her speech is normal and behavior is normal. Judgment and thought content normal.          Assessment & Plan:  2-3 months, sooner prn

## 2013-07-07 NOTE — Patient Instructions (Addendum)
General Instructions: Please read the information below Please take diflucan x 1  Follow up in 2-3 months, sooner if needed    Treatment Goals:  Goals (1 Years of Data) as of 07/07/13         As of Today 06/07/13 06/02/13 06/01/13 05/25/13     Blood Pressure    . Blood Pressure < 140/90  139/84 142/78 134/80 132/80 129/72      Progress Toward Treatment Goals:  Treatment Goal 07/07/2013  Blood pressure at goal    Self Care Goals & Plans:  Self Care Goal 07/07/2013  Manage my medications take my medicines as prescribed; bring my medications to every visit; refill my medications on time  Monitor my health keep track of my blood pressure  Eat healthy foods drink diet soda or water instead of juice or soda; eat more vegetables; eat foods that are low in salt; eat baked foods instead of fried foods; eat fruit for snacks and desserts; eat smaller portions  Be physically active find an activity I enjoy  Meeting treatment goals maintain the current self-care plan    No flowsheet data found.   Care Management & Community Referrals:  Referral 07/07/2013  Referrals made for care management support none needed  Referrals made to community resources none       Dysuria Dysuria is the medical term for pain with urination. There are many causes for dysuria, but urinary tract infection is the most common. If a urinalysis was performed it can show that there is a urinary tract infection. A urine culture confirms that you or your child is sick. You will need to follow up with a healthcare provider because:  If a urine culture was done you will need to know the culture results and treatment recommendations.  If the urine culture was positive, you or your child will need to be put on antibiotics or know if the antibiotics prescribed are the right antibiotics for your urinary tract infection.  If the urine culture is negative (no urinary tract infection), then other causes may need to be explored  or antibiotics need to be stopped. Today laboratory work may have been done and there does not seem to be an infection. If cultures were done they will take at least 24 to 48 hours to be completed. Today x-rays may have been taken and they read as normal. No cause can be found for the problems. The x-rays may be re-read by a radiologist and you will be contacted if additional findings are made. You or your child may have been put on medications to help with this problem until you can see your primary caregiver. If the problems get better, see your primary caregiver if the problems return. If you were given antibiotics (medications which kill germs), take all of the mediations as directed for the full course of treatment.  If laboratory work was done, you need to find the results. Leave a telephone number where you can be reached. If this is not possible, make sure you find out how you are to get test results. HOME CARE INSTRUCTIONS   Drink lots of fluids. For adults, drink eight, 8 ounce glasses of clear juice or water a day. For children, replace fluids as suggested by your caregiver.  Empty the bladder often. Avoid holding urine for long periods of time.  After a bowel movement, women should cleanse front to back, using each tissue only once.  Empty your bladder before and after sexual intercourse.  Take all the medicine given to you until it is gone. You may feel better in a few days, but TAKE ALL MEDICINE.  Avoid caffeine, tea, alcohol and carbonated beverages, because they tend to irritate the bladder.  In men, alcohol may irritate the prostate.  Only take over-the-counter or prescription medicines for pain, discomfort, or fever as directed by your caregiver.  If your caregiver has given you a follow-up appointment, it is very important to keep that appointment. Not keeping the appointment could result in a chronic or permanent injury, pain, and disability. If there is any problem keeping  the appointment, you must call back to this facility for assistance. SEEK IMMEDIATE MEDICAL CARE IF:   Back pain develops.  A fever develops.  There is nausea (feeling sick to your stomach) or vomiting (throwing up).  Problems are no better with medications or are getting worse. MAKE SURE YOU:   Understand these instructions.  Will watch your condition.  Will get help right away if you are not doing well or get worse. Document Released: 12/06/2003 Document Revised: 06/01/2011 Document Reviewed: 10/13/2007 [facility] Patient Information 2014 [place]. Phenazopyridine tablets What is this medicine? PHENAZOPYRIDINE (fen az oh PEER i deen) is a pain reliever. It is used to stop the pain, burning, or discomfort caused by infection or irritation of the urinary tract. This medicine is not an antibiotic. It will not cure a urinary tract infection. This medicine may be used for other purposes; ask your health care provider or pharmacist if you have questions. COMMON BRAND NAME(S): AZO, Azo-100, Azo-Gesic, Azo-Septic, Azo-Standard, Phenazo, Prodium, Pyridium, Urinary Analgesic , Uristat What should I tell my health care provider before I take this medicine? They need to know if you have any of these conditions: -glucose-6-phosphate dehydrogenase (G6PD) deficiency -kidney disease -an unusual or allergic reaction to phenazopyridine, other medicines, foods, dyes, or preservatives -pregnant or trying to get pregnant -breast-feeding How should I use this medicine? Take this medicine by mouth with a glass of water. Follow the directions on the prescription label. Take after meals. Take your doses at regular intervals. Do not take your medicine more often than directed. Do not skip doses or stop your medicine early even if you feel better. Do not stop taking except on your doctor's advice. Talk to your pediatrician regarding the use of this medicine in children. Special care may be  needed. Overdosage: If you think you have taken too much of this medicine contact a poison control center or emergency room at once. NOTE: This medicine is only for you. Do not share this medicine with others. What if I miss a dose? If you miss a dose, take it as soon as you can. If it is almost time for your next dose, take only that dose. Do not take double or extra doses. What may interact with this medicine? Interactions are not expected. This list may not describe all possible interactions. Give your health care provider a list of all the medicines, herbs, non-prescription drugs, or dietary supplements you use. Also tell them if you smoke, drink alcohol, or use illegal drugs. Some items may interact with your medicine. What should I watch for while using this medicine? Tell your doctor or health care professional if your symptoms do not improve or if they get worse. This medicine colors body fluids red. This effect is harmless and will go away after you are done taking the medicine. It will change urine to an dark orange or red color.  The red color may stain clothing. Soft contact lenses may become permanently stained. It is best not to wear soft contact lenses while taking this medicine. If you are diabetic you may get a false positive result for sugar in your urine. Talk to your health care provider. What side effects may I notice from receiving this medicine? Side effects that you should report to your doctor or health care professional as soon as possible: -allergic reactions like skin rash, itching or hives, swelling of the face, lips, or tongue -blue or purple color of the skin -difficulty breathing -fever -less urine -unusual bleeding, bruising -unusual tired, weak -vomiting -yellowing of the eyes or skin Side effects that usually do not require medical attention (report to your doctor or health care professional if they continue or are bothersome): -dark  urine -headache -stomach upset This list may not describe all possible side effects. Call your doctor for medical advice about side effects. You may report side effects to FDA at 1-800-FDA-1088. Where should I keep my medicine? Keep out of the reach of children. Store at room temperature between 15 and 30 degrees C (59 and 86 degrees F). Protect from light and moisture. Throw away any unused medicine after the expiration date. NOTE: This sheet is a summary. It may not cover all possible information. If you have questions about this medicine, talk to your doctor, pharmacist, or health care provider.  2014, Elsevier/[employer] Standard. (2007-10-06 11:04:07)  Fluconazole tablets What is this medicine? FLUCONAZOLE (floo KON na zole) is an antifungal medicine. It is used to treat certain kinds of fungal or yeast infections. This medicine may be used for other purposes; ask your health care provider or pharmacist if you have questions. COMMON BRAND NAME(S): Diflucan What should I tell my health care provider before I take this medicine? They need to know if you have any of these conditions: -electrolyte abnormalities -history of irregular heart beat -kidney disease -an unusual or allergic reaction to fluconazole, other azole antifungals, medicines, foods, dyes, or preservatives -pregnant or trying to get pregnant -breast-feeding How should I use this medicine? Take this medicine by mouth. Follow the directions on the prescription label. Do not take your medicine more often than directed. Talk to your pediatrician regarding the use of this medicine in children. Special care may be needed. This medicine has been used in children as young as 67 months of age. Overdosage: If you think you have taken too much of this medicine contact a poison control center or emergency room at once. NOTE: This medicine is only for you. Do not share this medicine with others. What if I miss a dose? If you miss a dose,  take it as soon as you can. If it is almost time for your next dose, take only that dose. Do not take double or extra doses. What may interact with this medicine? Do not take this medicine with any of the following medications: -astemizole -certain medicines for irregular heart beat like dofetilide, dronedarone, quinidine -cisapride -erythromycin -lomitapide -other medicines that prolong the QT interval (cause an abnormal heart rhythm) -pimozide -terfenadine -thioridazine -tolvaptan -ziprasidone  This medicine may also interact with the following medications: -antiviral medicines for HIV or AIDS -birth control pills -certain antibiotics like rifabutin, rifampin -certain medicines for blood pressure like amlodipine, isradipine, felodipine, hydrochlorothiazide, losartan, nifedipine -certain medicines for cancer like cyclophosphamide, vinblastine, vincristine -certain medicines for cholesterol like atorvastatin, lovastatin, fluvastatin, simvastatin -certain medicines for depression, anxiety, or psychotic disturbances like amitriptyline, midazolam, nortriptyline,  triazolam -certain medicines for diabetes like glipizide, glyburide, tolbutamide -certain medicines for pain like alfentanil, fentanyl, methadone -certain medicines for seizures like carbamazepine, phenytoin -certain medicines that treat or prevent blood clots like warfarin -halofantrine -medicines that lower your chance of fighting infection like cyclosporine, prednisone, tacrolimus -NSAIDS, medicines for pain and inflammation, like celecoxib, diclofenac, flurbiprofen, ibuprofen, meloxicam, naproxen -other medicines for fungal infections -sirolimus -theophylline -tofacitinib This list may not describe all possible interactions. Give your health care provider a list of all the medicines, herbs, non-prescription drugs, or dietary supplements you use. Also tell them if you smoke, drink alcohol, or use illegal drugs. Some items may  interact with your medicine. What should I watch for while using this medicine? Visit your doctor or health care professional for regular checkups. If you are taking this medicine for a long time you may need blood work. Tell your doctor if your symptoms do not improve. Some fungal infections need many weeks or months of treatment to cure. Alcohol can increase possible damage to your liver. Avoid alcoholic drinks. If you have a vaginal infection, do not have sex until you have finished your treatment. You can wear a sanitary napkin. Do not use tampons. Wear freshly washed cotton, not synthetic, panties. What side effects may I notice from receiving this medicine? Side effects that you should report to your doctor or health care professional as soon as possible: -allergic reactions like skin rash or itching, hives, swelling of the lips, mouth, tongue, or throat -dark urine -feeling dizzy or faint -irregular heartbeat or chest pain -redness, blistering, peeling or loosening of the skin, including inside the mouth -trouble breathing -unusual bruising or bleeding -vomiting -yellowing of the eyes or skin  Side effects that usually do not require medical attention (report to your doctor or health care professional if they continue or are bothersome): -changes in how food tastes -diarrhea -headache -stomach upset or nausea This list may not describe all possible side effects. Call your doctor for medical advice about side effects. You may report side effects to FDA at 1-800-FDA-1088. Where should I keep my medicine? Keep out of the reach of children. Store at room temperature below 30 degrees C (86 degrees F). Throw away any medicine after the expiration date. NOTE: This sheet is a summary. It may not cover all possible information. If you have questions about this medicine, talk to your doctor, pharmacist, or health care provider.  2014, Elsevier/Gold Standard. (2012-10-15  16:13:04)  Ciprofloxacin tablets What is this medicine? CIPROFLOXACIN (sip roe FLOX a sin) is a quinolone antibiotic. It is used to treat certain kinds of bacterial infections. It will not work for colds, flu, or other viral infections. This medicine may be used for other purposes; ask your health care provider or pharmacist if you have questions. COMMON BRAND NAME(S): Cipro What should I tell my health care provider before I take this medicine? They need to know if you have any of these conditions: -bone problems -cerebral disease -joint problems -irregular heartbeat -kidney disease -liver disease -myasthenia gravis -seizure disorder -tendon problems -an unusual or allergic reaction to ciprofloxacin, other antibiotics or medicines, foods, dyes, or preservatives -pregnant or trying to get pregnant -breast-feeding How should I use this medicine? Take this medicine by mouth with a glass of water. Follow the directions on the prescription label. Take your medicine at regular intervals. Do not take your medicine more often than directed. Take all of your medicine as directed even if you think your are better.  Do not skip doses or stop your medicine early. You can take this medicine with food or on an empty stomach. It can be taken with a meal that contains dairy or calcium, but do not take it alone with a dairy product, like milk or yogurt or calcium-fortified juice. A special MedGuide will be given to you by the pharmacist with each prescription and refill. Be sure to read this information carefully each time. Talk to your pediatrician regarding the use of this medicine in children. Special care may be needed. Overdosage: If you think you have taken too much of this medicine contact a poison control center or emergency room at once. NOTE: This medicine is only for you. Do not share this medicine with others. What if I miss a dose? If you miss a dose, take it as soon as you can. If it is  almost time for your next dose, take only that dose. Do not take double or extra doses. What may interact with this medicine? Do not take this medicine with any of the following medications: -cisapride -droperidol -terfenadine -tizanidine This medicine may also interact with the following medications: -antacids -caffeine -cyclosporin -didanosine (ddI) buffered tablets or powder -medicines for diabetes -medicines for inflammation like ibuprofen, naproxen -methotrexate -multivitamins -omeprazole -phenytoin -probenecid -sucralfate -theophylline -warfarin This list may not describe all possible interactions. Give your health care provider a list of all the medicines, herbs, non-prescription drugs, or dietary supplements you use. Also tell them if you smoke, drink alcohol, or use illegal drugs. Some items may interact with your medicine. What should I watch for while using this medicine? Tell your doctor or health care professional if your symptoms do not improve. Do not treat diarrhea with over the counter products. Contact your doctor if you have diarrhea that lasts more than 2 days or if it is severe and watery. You may get drowsy or dizzy. Do not drive, use machinery, or do anything that needs mental alertness until you know how this medicine affects you. Do not stand or sit up quickly, especially if you are an older patient. This reduces the risk of dizzy or fainting spells. This medicine can make you more sensitive to the sun. Keep out of the sun. If you cannot avoid being in the sun, wear protective clothing and use sunscreen. Do not use sun lamps or tanning beds/booths. Avoid antacids, aluminum, calcium, iron, magnesium, and zinc products for 6 hours before and 2 hours after taking a dose of this medicine. What side effects may I notice from receiving this medicine? Side effects that you should report to your doctor or health care professional as soon as possible: - allergic  reactions like skin rash, itching or hives, swelling of the face, lips, or tongue - breathing problems - confusion, nightmares or hallucinations - feeling faint or lightheaded, falls - irregular heartbeat - joint, muscle or tendon pain or swelling - pain or trouble passing urine -persistent headache with or without blurred vision - redness, blistering, peeling or loosening of the skin, including inside the mouth - seizure - unusual pain, numbness, tingling, or weakness Side effects that usually do not require medical attention (report to your doctor or health care professional if they continue or are bothersome): - diarrhea - nausea or stomach upset - white patches or sores in the mouth This list may not describe all possible side effects. Call your doctor for medical advice about side effects. You may report side effects to FDA at 1-800-FDA-1088. Where should  I keep my medicine? Keep out of the reach of children. Store at room temperature below 30 degrees C (86 degrees F). Keep container tightly closed. Throw away any unused medicine after the expiration date. NOTE: This sheet is a summary. It may not cover all possible information. If you have questions about this medicine, talk to your doctor, pharmacist, or health care provider.  2014, Elsevier/Gold Standard. (2011-03-27 12:53:06)

## 2013-07-08 ENCOUNTER — Encounter: Payer: Self-pay | Admitting: Internal Medicine

## 2013-07-08 DIAGNOSIS — R300 Dysuria: Secondary | ICD-10-CM | POA: Insufficient documentation

## 2013-07-08 LAB — URINALYSIS, ROUTINE W REFLEX MICROSCOPIC
Bilirubin Urine: NEGATIVE
Glucose, UA: NEGATIVE mg/dL
Hgb urine dipstick: NEGATIVE
Ketones, ur: NEGATIVE mg/dL
Nitrite: NEGATIVE
Protein, ur: NEGATIVE mg/dL
Specific Gravity, Urine: 1.012 (ref 1.005–1.030)
Urobilinogen, UA: 0.2 mg/dL (ref 0.0–1.0)
pH: 5.5 (ref 5.0–8.0)

## 2013-07-08 LAB — URINALYSIS, MICROSCOPIC ONLY
Bacteria, UA: NONE SEEN
Casts: NONE SEEN
Crystals: NONE SEEN
Squamous Epithelial / LPF: NONE SEEN

## 2013-07-08 NOTE — Assessment & Plan Note (Signed)
Pending CPAP titration to get CPAP machine, pt able to afford machine now

## 2013-07-08 NOTE — Assessment & Plan Note (Signed)
BP Readings from Last 3 Encounters:  07/07/13 139/84  06/07/13 142/78  06/02/13 134/80    Lab Results  Component Value Date   NA 141 07/07/2013   K 3.7 07/07/2013   CREATININE 1.21* 07/07/2013    Assessment: Blood pressure control: controlled Progress toward BP goal:  at goal Comments: none  Plan: Medications:  continue current medications Educational resources provided: other (see comments) Self management tools provided: other (see comments) Other plans: BMET checked today f/u in 2-3 months

## 2013-07-08 NOTE — Assessment & Plan Note (Signed)
Rx refill ted hose today she needs an new pair

## 2013-07-08 NOTE — Assessment & Plan Note (Signed)
Patient has h/o E.coli UTI so could be UTI, also possibly yeast Pt declines pelvic exam today Will empirically tx for both UTI, yeast  Diflucan 150 mg x 1, Cipro 500 mg bid x 7 days (will go ahead and tx since today is Friday and pt uncomfortable do not want her to be like this through the weekend until resulted) Px Pyridium 100 mg tid prn x 2 days  UA, Urine culture today If neg will call to tell pt d/c Abx on Monday If patient not getting better consider atrophic vaginitis as etiology

## 2013-07-08 NOTE — Assessment & Plan Note (Signed)
Checked BMET today

## 2013-07-10 ENCOUNTER — Encounter: Payer: Self-pay | Admitting: Internal Medicine

## 2013-07-10 ENCOUNTER — Ambulatory Visit: Payer: Medicare Other | Admitting: Physical Therapy

## 2013-07-10 LAB — URINE CULTURE: Colony Count: >=100000

## 2013-07-10 NOTE — Progress Notes (Signed)
I reviewed presenting complaints with resident physician Dr Karlyn Agee and I concur with management plan. Murriel Hopper, MD, [place]  Hematology-Oncology/Internal Medicine

## 2013-07-11 ENCOUNTER — Other Ambulatory Visit: Payer: Self-pay | Admitting: *Deleted

## 2013-07-11 DIAGNOSIS — G4733 Obstructive sleep apnea (adult) (pediatric): Secondary | ICD-10-CM

## 2013-07-12 ENCOUNTER — Ambulatory Visit: Payer: Medicare Other

## 2013-07-12 NOTE — Progress Notes (Signed)
Sleep center was unable to see the original cpap titration orders placed on this patient in epic.  Original orders canceled and re-entered, orders now viewable to sleep center.Kru Allman C Goldston4/22/20153:39 PM

## 2013-07-17 ENCOUNTER — Ambulatory Visit: Payer: Medicare Other | Admitting: Physical Therapy

## 2013-07-19 ENCOUNTER — Ambulatory Visit: Payer: Medicare Other | Admitting: Physical Therapy

## 2013-07-24 ENCOUNTER — Ambulatory Visit: Payer: Medicare Other | Attending: Internal Medicine | Admitting: Physical Therapy

## 2013-07-24 DIAGNOSIS — I890 Lymphedema, not elsewhere classified: Secondary | ICD-10-CM | POA: Insufficient documentation

## 2013-07-24 DIAGNOSIS — IMO0001 Reserved for inherently not codable concepts without codable children: Secondary | ICD-10-CM | POA: Insufficient documentation

## 2013-07-24 DIAGNOSIS — M25519 Pain in unspecified shoulder: Secondary | ICD-10-CM | POA: Insufficient documentation

## 2013-07-24 DIAGNOSIS — C50919 Malignant neoplasm of unspecified site of unspecified female breast: Secondary | ICD-10-CM | POA: Insufficient documentation

## 2013-07-26 ENCOUNTER — Ambulatory Visit: Payer: Medicare Other | Admitting: Physical Therapy

## 2013-07-27 ENCOUNTER — Telehealth: Payer: Self-pay

## 2013-07-27 NOTE — Telephone Encounter (Signed)
Let pt know KK out on LOA.  Appts rescheduled with [facility], lab at 1045, [place] at 1115.  Pt voiced understanding.

## 2013-07-31 ENCOUNTER — Ambulatory Visit: Payer: Medicare Other | Admitting: Physical Therapy

## 2013-08-02 ENCOUNTER — Ambulatory Visit: Payer: Medicare Other | Admitting: Physical Therapy

## 2013-08-07 ENCOUNTER — Ambulatory Visit: Payer: Medicare Other | Admitting: Physical Therapy

## 2013-08-10 ENCOUNTER — Other Ambulatory Visit: Payer: Medicare Other

## 2013-08-10 ENCOUNTER — Encounter: Payer: Self-pay | Admitting: Oncology

## 2013-08-10 ENCOUNTER — Ambulatory Visit (HOSPITAL_BASED_OUTPATIENT_CLINIC_OR_DEPARTMENT_OTHER): Payer: Medicare Other

## 2013-08-10 ENCOUNTER — Ambulatory Visit (HOSPITAL_BASED_OUTPATIENT_CLINIC_OR_DEPARTMENT_OTHER): Payer: Medicare Other | Admitting: Oncology

## 2013-08-10 ENCOUNTER — Telehealth: Payer: Self-pay | Admitting: Oncology

## 2013-08-10 ENCOUNTER — Ambulatory Visit: Payer: Medicare Other | Admitting: Oncology

## 2013-08-10 VITALS — BP 164/92 | HR 90 | Temp 97.9°F | Resp 18 | Ht 68.0 in | Wt 236.0 lb

## 2013-08-10 DIAGNOSIS — F411 Generalized anxiety disorder: Secondary | ICD-10-CM

## 2013-08-10 DIAGNOSIS — C50919 Malignant neoplasm of unspecified site of unspecified female breast: Secondary | ICD-10-CM

## 2013-08-10 DIAGNOSIS — M7989 Other specified soft tissue disorders: Secondary | ICD-10-CM

## 2013-08-10 DIAGNOSIS — N959 Unspecified menopausal and perimenopausal disorder: Secondary | ICD-10-CM

## 2013-08-10 DIAGNOSIS — Z170 Estrogen receptor positive status [ER+]: Secondary | ICD-10-CM

## 2013-08-10 LAB — CBC WITH DIFFERENTIAL/PLATELET
BASO%: 0.7 % (ref 0.0–2.0)
Basophils Absolute: 0 10*3/uL (ref 0.0–0.1)
EOS%: 4.8 % (ref 0.0–7.0)
Eosinophils Absolute: 0.3 10*3/uL (ref 0.0–0.5)
HCT: 33.7 % — ABNORMAL LOW (ref 34.8–46.6)
HGB: 11.1 g/dL — ABNORMAL LOW (ref 11.6–15.9)
LYMPH%: 41.6 % (ref 14.0–49.7)
MCH: 28.4 pg (ref 25.1–34.0)
MCHC: 32.9 g/dL (ref 31.5–36.0)
MCV: 86.2 fL (ref 79.5–101.0)
MONO#: 0.3 10*3/uL (ref 0.1–0.9)
MONO%: 5.1 % (ref 0.0–14.0)
NEUT#: 2.9 10*3/uL (ref 1.5–6.5)
NEUT%: 47.8 % (ref 38.4–76.8)
Platelets: 213 10*3/uL (ref 145–400)
RBC: 3.91 10*6/uL (ref 3.70–5.45)
RDW: 13.1 % (ref 11.2–14.5)
WBC: 6 10*3/uL (ref 3.9–10.3)
lymph#: 2.5 10*3/uL (ref 0.9–3.3)

## 2013-08-10 LAB — COMPREHENSIVE METABOLIC PANEL (CC13)
ALT: 24 U/L (ref 0–55)
AST: 20 U/L (ref 5–34)
Albumin: 4.3 g/dL (ref 3.5–5.0)
Alkaline Phosphatase: 78 U/L (ref 40–150)
Anion Gap: 12 meq/L — ABNORMAL HIGH (ref 3–11)
BUN: 18.8 mg/dL (ref 7.0–26.0)
CO2: 29 meq/L (ref 22–29)
Calcium: 10.2 mg/dL (ref 8.4–10.4)
Chloride: 107 meq/L (ref 98–109)
Creatinine: 1.3 mg/dL — ABNORMAL HIGH (ref 0.6–1.1)
Glucose: 139 mg/dL (ref 70–140)
Potassium: 4 meq/L (ref 3.5–5.1)
Sodium: 147 meq/L — ABNORMAL HIGH (ref 136–145)
Total Bilirubin: 0.57 mg/dL (ref 0.20–1.20)
Total Protein: 7.4 g/dL (ref 6.4–8.3)

## 2013-08-10 MED ORDER — LETROZOLE 2.5 MG PO TABS
ORAL_TABLET | ORAL | Status: DC
Start: 2013-08-10 — End: 2014-01-25

## 2013-08-10 NOTE — Patient Instructions (Signed)
Hot flashes: Take Effexor (venlafaxine) 37.5 mg daily.

## 2013-08-10 NOTE — Progress Notes (Signed)
OFFICE PROGRESS NOTE  CC [name], MD [name], MD  Cresenciano Genre, MD [place] [address]  DIAGNOSIS: 63 year old female with  #1 stage II A. ER/PR positive HER-2/neu positive invasive ductal carcinoma of the left breast diagnosed October 2011. Patient is status post bilateral mastectomies.  #2 status post right CVA.  #3 right internal jugular deep venous thrombosis secondary to her Port-A-Cath  #4 status post PICC line placement for ongoing Herceptin therapy.  PRIOR THERAPY:  #1 Status post bilateral mastectomy performed in October 2011  #2 The left breast: final pathology revealed infiltrating ductal carcinoma that was ER positive PR positive HER-2/neu positive. Right breast specimen only revealed benign breast tissue.  #3 patient received adjuvant chemotherapy consisting of weekly Taxotere carboplatinum and Herceptin for a total of 6 cycle.  #4 Patient has completed Herceptin on 04/17/2011.  #5 she has been on letrozole 2.5 mg starting June 2012.  CURRENT THERAPY: Letrozole 2.5 mg daily since June 2012  INTERVAL HISTORY: [name] 63 y.o. female returns today follow up visit. Remains on letrozole and has been tolerating this well. She has not noticed any skin changes or lumps at her bilateral mastectomy sites. Has been to PT for lymphedema to the left arm. Now uses a compression sleeve. Reports hot flashes are worse. Did not start taking Effexor as prescribed at her last visit. She denies any headaches double vision blurring of vision no fevers chills. No abdominal pain no diarrhea or constipation no back pain. Is thinking about reconstructive surgery. Remainder of the 10 point review of systems is negative.   MEDICAL HISTORY: Past Medical History  Diagnosis Date  . Depression     seasonal melancholia  . Hyperlipidemia   . Hypertension   . Myopathy     not statin related, cause unknown   . Back pain   . Edema extremities    . Chemotherapy follow-up examination 03/06/2011  . Cough 03/06/2011  . Arthritis     DJD, lumbar spondylosis  . Bronchitis 03/10/2012  . Asthma   . GERD (gastroesophageal reflux disease)   . Cataract   . Breast cancer     left breast invasive ductal ca in remission as of 10/07/11 s/p double mastectomy    ALLERGIES:  is allergic to atorvastatin and rosuvastatin.  MEDICATIONS:  Current Outpatient Prescriptions  Medication Sig Dispense Refill  . albuterol (PROVENTIL HFA;VENTOLIN HFA) 108 (90 BASE) MCG/ACT inhaler Inhale 1-2 puffs into the lungs every 6 (six) hours as needed for wheezing or shortness of breath.  1 Inhaler  2  . albuterol (PROVENTIL,VENTOLIN) 90 MCG/ACT inhaler Inhale 2 puffs into the lungs every 4 (four) hours as needed. For wheezing      . amLODipine (NORVASC) 5 MG tablet Take 1 tablet (5 mg total) by mouth daily.  30 tablet  5  . aspirin 81 MG tablet Take 81 mg by mouth daily.      . carvedilol (COREG) 6.25 MG tablet Take 1 tablet (6.25 mg total) by mouth 2 (two) times daily with a meal.  180 tablet  3  . cholecalciferol (VITAMIN D) 1000 UNITS tablet Take 1,000 Units by mouth daily.      . ciprofloxacin (CIPRO) 500 MG tablet Take 1 tablet (500 mg total) by mouth 2 (two) times daily.  14 tablet  0  . ferrous sulfate 325 (65 FE) MG tablet Take 1 tablet (325 mg total) by mouth 3 (three) times daily with meals.  90 tablet  6  . fluconazole (DIFLUCAN) 150 MG tablet Take 1 tablet (150 mg total) by mouth once.  1 tablet  0  . Fluticasone-Salmeterol (ADVAIR) 100-50 MCG/DOSE AEPB Inhale 1 puff into the lungs 2 (two) times daily as needed (wheezing).  60 each  1  . furosemide (LASIX) 40 MG tablet Take 40 mg by mouth as needed (excess fluid).      Marland Kitchen letrozole (FEMARA) 2.5 MG tablet TAKE 1 TABLET (2.5 MG TOTAL) BY MOUTH DAILY.  90 tablet  3  . LORazepam (ATIVAN) 1 MG tablet TAKE 1 TABLET BY MOUTH DAILY AS NEEDED FOR ANXIETY  30 tablet  1  . Multiple Vitamin (MULTIVITAMIN) tablet  Take 1 tablet by mouth daily.       Marland Kitchen olmesartan (BENICAR) 40 MG tablet Take 1 tablet (40 mg total) by mouth daily.  30 tablet  5  . pantoprazole (PROTONIX) 40 MG tablet Take 1 tablet (40 mg total) by mouth 2 (two) times daily.  60 tablet  3  . phenazopyridine (PYRIDIUM) 100 MG tablet Take 1 tablet (100 mg total) by mouth 3 (three) times daily as needed for pain.  6 tablet  0  . potassium chloride SA (K-DUR,KLOR-CON) 20 MEQ tablet Take 1 tablet (20 mEq total) by mouth daily. When you take Lasix  30 tablet  3  . Simethicone 125 MG CAPS Take 1 capsule (125 mg total) by mouth 4 (four) times daily as needed (gas, bloating).  90 each  2  . simvastatin (ZOCOR) 20 MG tablet TAKE 1 TABLET (20 MG TOTAL) BY MOUTH AT BEDTIME.  90 tablet  1  . spironolactone (ALDACTONE) 25 MG tablet Take 1 tablet (25 mg total) by mouth daily.  30 tablet  12  . traMADol (ULTRAM) 50 MG tablet Take 1 tablet (50 mg total) by mouth every 6 (six) hours as needed. For pain  90 tablet  3  . triamcinolone cream (KENALOG) 0.1 % Apply topically 2 (two) times daily as needed.  454 g  0  . UNABLE TO FIND Rx: L8000- Mastectomy Bra (Quantity: 6) [id number]- Mastectomy Forms Replacement, Bilateral (Quantity: 2) Dx: 174.9; Bilateral Mastectomy  1 each  0   No current facility-administered medications for this visit.   Facility-Administered Medications Ordered in Other Visits  Medication Dose Route Frequency Provider Last Rate Last Dose  . sodium chloride 0.9 % injection 10 mL  10 mL Intravenous PRN Deatra Robinson, MD   10 mL at 01/26/11 1035    SURGICAL HISTORY:  Past Surgical History  Procedure Laterality Date  . Abdominal hysterectomy      for fibroids   . Laminectomy      C5-6 Dr. Louanne Skye 2005   . Oophorectomy    . Mastectomy      bilateral   . Tonsillectomy    . Appendectomy    . Incontinence surgery    . Breast surgery  2011    Bilateral mastectomy    REVIEW OF SYSTEMS:  General: fatigue (-), night sweats (-), fever (-), pain  (-) Lymph: palpable nodes (-) HEENT: vision changes (-), mucositis (-), gum bleeding (-), epistaxis (-) Cardiovascular: chest pain (-), palpitations (-) Pulmonary: shortness of breath (-), dyspnea on exertion (-), cough (-), hemoptysis (-) GI:  Early satiety (-), melena (-), dysphagia (-), nausea/vomiting (-), diarrhea (-) GU: dysuria (-), hematuria (-), incontinence (-) Musculoskeletal: joint swelling (-), joint pain (-), back pain (-) Neuro: weakness (-), numbness (-), headache (-), confusion (-) Skin: Rash (-), lesions (-),  dryness (-) Psych: depression (-), suicidal/homicidal ideation (-), feeling of hopelessness (-)  Health Maintenance  Mammogram: n/a Colonoscopy: 05/2012 Bone Density Scan: 07/2011-normal Pap Smear: about 1 year ago Eye Exam: 05/2012 Vitamin D Level: 01/20/2013 Lipid Panel:  08/2012    PHYSICAL EXAMINATION:  BP 164/92  Pulse 90  Temp(Src) 97.9 F (36.6 C) (Oral)  Resp 18  Ht $R'5\' 8"'Fg$  (1.727 m)  Wt 236 lb (107.049 kg)  BMI 35.89 kg/m2  LMP 12/31/1968 General: Patient is a well appearing female in no acute distress HEENT: PERRLA, sclerae anicteric no conjunctival pallor, MMM Neck: supple, no palpable adenopathy Lungs: clear to auscultation bilaterally, no wheezes, rhonchi, or rales Cardiovascular: regular rate rhythm, S1, S2, no murmurs, rubs or gallops Abdomen: Soft, non-tender, non-distended, normoactive bowel sounds, no HSM Extremities: warm and well perfused, no clubbing, cyanosis, or edema Skin: No rashes or lesions Neuro: Non-focal Anterior chest wall bilateral mastectomy scars are well healed there is no evidence of local recurrence. ECOG PERFORMANCE STATUS: 0 - Asymptomatic  LABORATORY DATA: Lab Results  Component Value Date   WBC 6.0 08/10/2013   HGB 11.1* 08/10/2013   HCT 33.7* 08/10/2013   MCV 86.2 08/10/2013   PLT 213 08/10/2013      Chemistry      Component Value Date/Time   NA 141 07/07/2013 1544   NA 143 01/20/2013 1024   K 3.7  07/07/2013 1544   K 4.6 01/20/2013 1024   CL 105 07/07/2013 1544   CL 103 04/25/2012 1045   CO2 29 07/07/2013 1544   CO2 22 01/20/2013 1024   BUN 18 07/07/2013 1544   BUN 19.8 01/20/2013 1024   CREATININE 1.21* 07/07/2013 1544   CREATININE 1.3* 01/20/2013 1024   CREATININE 1.43* 11/23/2012 2005      Component Value Date/Time   CALCIUM 9.9 07/07/2013 1544   CALCIUM 10.6* 01/20/2013 1024   ALKPHOS 72 01/20/2013 1024   ALKPHOS 76 12/14/2012 1502   AST 20 01/20/2013 1024   AST 20 12/14/2012 1502   ALT 25 01/20/2013 1024   ALT 22 12/14/2012 1502   BILITOT 0.66 01/20/2013 1024   BILITOT 0.4 12/14/2012 1502       RADIOGRAPHIC STUDIES:  No results found.  ASSESSMENT: 63 year old female with:  1.  stage II a invasive ductal carcinoma of the left breast diagnosed in October 2011. She was found to have a ER/PR positive HER-2/neu positive breast cancer.She underwent bilateral mastectomies. Her right mastectomy was an elective prophylactic mastectomy due to her on comfort. Patient subsequently underwent adjuvant chemotherapy consisting of Taxotere carboplatinum and Herceptin for a total of 6 cycles. Thereafter she received Herceptin every 3 weeks and she has been on Letrozole 2.5 mg daily. Overall she is tolerating it well she has no evidence of recurrent disease.  #2 anterior chest pain will obtain bone scan  #3 right upper extremity swelling for her to physical therapy  #4 anxiety  #5 hot flashes   PLAN:  #1 Continue letrozole. It has been 2 years since her last bone scan. I have ordered another scan to be done this month for routine osteoporosis screening.  #2 right upper extremity swelling - improved with PT and compression sleeve. Encouraged her to continue exercises and to use the sleeve.  #3 Anxiety. She is on Ativan as needed.  #4 Hot flashes. I have encouraged her to begin taking the Effexor as previously prescribed.   #5 patient will be seen back in 6 months time or sooner if need  arises. If she wishes to pursue reconstructive surgery, she can call us for a referral to a plastic surgeon.  All questions were answered. The patient knows to call the clinic with any problems, questions or concerns. We can certainly see the patient much sooner if necessary.  I spent 25 minutes counseling the patient face to face. The total time spent in the appointment was 30 minutes.  Mikey Bussing, DNP, AGPCNP-BC 08/10/2013, 1:20 PM

## 2013-08-10 NOTE — Telephone Encounter (Signed)
gv adn printed appt sched and avs for pt fro May and NOV...sent pt to lab

## 2013-08-11 LAB — VITAMIN D 25 HYDROXY (VIT D DEFICIENCY, FRACTURES): Vit D, 25-Hydroxy: 45 ng/mL (ref 30–89)

## 2013-08-16 ENCOUNTER — Encounter: Payer: Self-pay | Admitting: Dietician

## 2013-08-16 ENCOUNTER — Encounter: Payer: Self-pay | Admitting: Internal Medicine

## 2013-08-16 ENCOUNTER — Ambulatory Visit (INDEPENDENT_AMBULATORY_CARE_PROVIDER_SITE_OTHER): Payer: Medicare Other | Admitting: Internal Medicine

## 2013-08-16 ENCOUNTER — Ambulatory Visit (INDEPENDENT_AMBULATORY_CARE_PROVIDER_SITE_OTHER): Payer: Medicare Other | Admitting: Dietician

## 2013-08-16 VITALS — BP 154/87 | HR 77 | Temp 99.0°F | Wt 231.9 lb

## 2013-08-16 DIAGNOSIS — E119 Type 2 diabetes mellitus without complications: Secondary | ICD-10-CM

## 2013-08-16 DIAGNOSIS — I10 Essential (primary) hypertension: Secondary | ICD-10-CM

## 2013-08-16 LAB — GLUCOSE, CAPILLARY: Glucose-Capillary: 120 mg/dL — ABNORMAL HIGH (ref 70–99)

## 2013-08-16 LAB — POCT GLYCOSYLATED HEMOGLOBIN (HGB A1C): Hemoglobin A1C: 7.1

## 2013-08-16 MED ORDER — CARVEDILOL 6.25 MG PO TABS: 12.5000 mg | ORAL_TABLET | Freq: Two times a day (BID) | ORAL | Status: DC

## 2013-08-16 NOTE — Progress Notes (Signed)
Medical Nutrition Therapy:  Appt start time: [id number] end time:  1050.  Assessment:  Primary concerns today: Meal planning. - newly diagnosed diabetes Patient wanted to know the basics about nutrition/meal planning and how she should be eating.  She is very motivated to improve her health with diet and exercise.  Patient was not prepared to see me today, had feelings of being overwhelmed with new diagnosis.    Exercise: encouraged but not specifically discussed.  Plan to address next meeting. Medicines and Labs were reviewed Anthropometrics: weight loss is appropriate  Blood sugars: reports self monitoring 24-hr recall: Patient did not want to discuss her food intake today.  Progress Towards Goal(s):  In progress.   Nutritional Diagnosis:  NB-1.1 Food and nutrition-related knowledge deficit As related to lack of sufficient prior nutrition-related education.  As evidenced by new medical diagnosis, portion control, and carb identication..    Intervention:  Nutrition-related education on balanced meals and carb control. Used plate method of meal planning.  Discussed her insurance coverage on diabetes related education and training. Monitoring/Evaluation:  Dietary intake, exercise, and body weight in 1 week(s).

## 2013-08-16 NOTE — Patient Instructions (Signed)
Can go to the American Diabetes Website   Play games like "rate your plate" at  http://mcguire.com/  See you next week.   Call me anytime! Butch Penny [phone]

## 2013-08-16 NOTE — Progress Notes (Signed)
Subjective:   Patient ID: [name] female   DOB: [birth year] 63 y.o.   MRN: [id number]  Chief Complaint  Patient presents with  . Hyperglycemia    c/o elevated glucoses    HPI: [name] is a 63 y.o. woman with h/o prediabetes, HLD, CKD 3, OSA, grade 1 HFPEF & HTN who presents with concerns of hyperglycemia.    Has been checking CBGs at home - Reports CBGs in the 120s-180s. She admits to having a poor diet. Breakfast: fried chicken, bag of chips, hot dog - anything that's in the fridge, especially when she doesn't feel like cooking [place] similar to breakfast.  Terrible sweet tooth - loves all things sweet such as butter pecan ice cream.    Sometimes exercises - walks around track, not daily, about twice per week  Family hx of DM: father (passed away of colon ca), 2 brothers  Has been told she is pre-diabetic in the past, never no medication.  Steroid injections in b/l feet, but this was in January.   Review of Systems: Constitutional: Denies fever, chills, diaphoresis, appetite change and fatigue.  HEENT: Denies photophobia, eye pain, redness, hearing loss, ear pain, congestion, sore throat, rhinorrhea, sneezing, mouth sores, trouble swallowing, neck pain, neck stiffness and tinnitus. History of cataracts - checks every March Respiratory: Denies SOB, cough, chest tightness.  Sometimes DOE with occ wheezing, still needs to organize CPAP Cardiovascular: Denies chest pain, palpitations; normal chronic leg swelling b/l Gastrointestinal: Denies nausea, vomiting, abdominal pain, diarrhea, constipation,blood in stool and abdominal distention.  Genitourinary: Denies dysuria, urgency, frequency, hematuria, flank pain and difficulty urinating.  Musculoskeletal: Denies myalgias, back pain, joint swelling, arthralgias and gait problem.  Skin: Denies pallor, rash and wound.  Neurological: Denies dizziness, seizures, syncope, weakness, lightheadedness, and  headaches. Numbness + tingling in fingers and toes Hematological: No obvious s/s of bleeding   Past Medical History  Diagnosis Date  . Depression     seasonal melancholia  . Hyperlipidemia   . Hypertension   . Myopathy     not statin related, cause unknown   . Back pain   . Edema extremities   . Chemotherapy follow-up examination 03/06/2011  . Cough 03/06/2011  . Arthritis     DJD, lumbar spondylosis  . Bronchitis 03/10/2012  . Asthma   . GERD (gastroesophageal reflux disease)   . Cataract   . Breast cancer     left breast invasive ductal ca in remission as of 10/07/11 s/p double mastectomy   Current Outpatient Prescriptions  Medication Sig Dispense Refill  . albuterol (PROVENTIL HFA;VENTOLIN HFA) 108 (90 BASE) MCG/ACT inhaler Inhale 1-2 puffs into the lungs every 6 (six) hours as needed for wheezing or shortness of breath.  1 Inhaler  2  . albuterol (PROVENTIL,VENTOLIN) 90 MCG/ACT inhaler Inhale 2 puffs into the lungs every 4 (four) hours as needed. For wheezing      . amLODipine (NORVASC) 5 MG tablet Take 1 tablet (5 mg total) by mouth daily.  30 tablet  5  . aspirin 81 MG tablet Take 81 mg by mouth daily.      . carvedilol (COREG) 6.25 MG tablet Take 1 tablet (6.25 mg total) by mouth 2 (two) times daily with a meal.  180 tablet  3  . cholecalciferol (VITAMIN D) 1000 UNITS tablet Take 1,000 Units by mouth daily.      . ciprofloxacin (CIPRO) 500 MG tablet Take 1 tablet (500 mg total) by mouth 2 (two)  times daily.  14 tablet  0  . ferrous sulfate 325 (65 FE) MG tablet Take 1 tablet (325 mg total) by mouth 3 (three) times daily with meals.  90 tablet  6  . fluconazole (DIFLUCAN) 150 MG tablet Take 1 tablet (150 mg total) by mouth once.  1 tablet  0  . Fluticasone-Salmeterol (ADVAIR) 100-50 MCG/DOSE AEPB Inhale 1 puff into the lungs 2 (two) times daily as needed (wheezing).  60 each  1  . furosemide (LASIX) 40 MG tablet Take 40 mg by mouth as needed (excess fluid).      Marland Kitchen letrozole  (FEMARA) 2.5 MG tablet TAKE 1 TABLET (2.5 MG TOTAL) BY MOUTH DAILY.  90 tablet  3  . LORazepam (ATIVAN) 1 MG tablet TAKE 1 TABLET BY MOUTH DAILY AS NEEDED FOR ANXIETY  30 tablet  1  . Multiple Vitamin (MULTIVITAMIN) tablet Take 1 tablet by mouth daily.       Marland Kitchen olmesartan (BENICAR) 40 MG tablet Take 1 tablet (40 mg total) by mouth daily.  30 tablet  5  . pantoprazole (PROTONIX) 40 MG tablet Take 1 tablet (40 mg total) by mouth 2 (two) times daily.  60 tablet  3  . phenazopyridine (PYRIDIUM) 100 MG tablet Take 1 tablet (100 mg total) by mouth 3 (three) times daily as needed for pain.  6 tablet  0  . potassium chloride SA (K-DUR,KLOR-CON) 20 MEQ tablet Take 1 tablet (20 mEq total) by mouth daily. When you take Lasix  30 tablet  3  . Simethicone 125 MG CAPS Take 1 capsule (125 mg total) by mouth 4 (four) times daily as needed (gas, bloating).  90 each  2  . simvastatin (ZOCOR) 20 MG tablet TAKE 1 TABLET (20 MG TOTAL) BY MOUTH AT BEDTIME.  90 tablet  1  . spironolactone (ALDACTONE) 25 MG tablet Take 1 tablet (25 mg total) by mouth daily.  30 tablet  12  . traMADol (ULTRAM) 50 MG tablet Take 1 tablet (50 mg total) by mouth every 6 (six) hours as needed. For pain  90 tablet  3  . triamcinolone cream (KENALOG) 0.1 % Apply topically 2 (two) times daily as needed.  454 g  0  . UNABLE TO FIND Rx: L8000- Mastectomy Bra (Quantity: 6) [id number] Mastectomy Forms Replacement, Bilateral (Quantity: 2) Dx: 174.9; Bilateral Mastectomy  1 each  0   No current facility-administered medications for this visit.   Facility-Administered Medications Ordered in Other Visits  Medication Dose Route Frequency Provider Last Rate Last Dose  . sodium chloride 0.9 % injection 10 mL  10 mL Intravenous PRN Deatra Robinson, MD   10 mL at 01/26/11 1035   Family History  Problem Relation Age of Onset  . Heart disease Mother 78    CAD  . Breast cancer Mother   . Cancer Mother 31    Breast cancer, mother  . Colon cancer Father   .  Cancer Father 40    colon cancer dx'ed age 19  . Coronary artery disease Other   . Multiple sclerosis Daughter    History   Social History  . Marital Status: Single    Spouse Name: N/A    Number of Children: N/A  . Years of Education: N/A   Social History Main Topics  . Smoking status: Never Smoker   . Smokeless tobacco: Never Used  . Alcohol Use: No  . Drug Use: No  . Sexual Activity: Not on file   Other Topics  Concern  . Not on file   Social History Narrative   Single, 2 kids   Never smoked   No alcohol use   No drugs   Laid off from [employer] after working there for 14 years.     Objective:  Physical Exam: Filed Vitals:   08/16/13 0904  BP: 154/87  Pulse: 77  Temp: 99 F (37.2 C)  TempSrc: Oral  Weight: 231 lb 14.4 oz (105.189 kg)  SpO2: 98%   General: pleasant, appears as stated age HEENT: PERRL, EOMI, no scleral icterus Cardiac: RRR, no rubs, murmurs or gallops Pulm: clear to auscultation bilaterally, moving normal volumes of air Abd: soft, nontender, nondistended, BS normoactive Ext: warm and well perfused, trace pretibial pitting edema, LUE sleeve in place Neuro: alert and oriented X3, cranial nerves II-XII grossly intact  Assessment & Plan:  Case and care discussed with Dr. Dareen Piano.  Please see problem oriented charting for further details. Patient to return in 3 months for DM follow up.

## 2013-08-16 NOTE — Assessment & Plan Note (Addendum)
Lab Results  Component Value Date   HGBA1C 7.1 08/16/2013   HGBA1C 6.4 08/22/2012   HGBA1C 6.5 04/27/2012     Assessment: Diabetes control:  uncontrolled Progress toward A1C goal:   deteriorated Comments: Patient with elevated A1c to 6.7 in the past, here today for CBGs in 180s at home, confirmed with A1c of 7.1  Plan: Medications:  Patient eager to try lifestyle modification prior to initation of metformin.  She will meet with Debera Lat today.  If in 3 months her A1c remains >7, suggest initiation of metformin (Cr 1.3, GFR 55) Instruction/counseling given: reminded to bring blood glucose meter & log to each visit, reminded to bring medications to each visit, discussed the need for weight loss and discussed diet Other plans: urine microalb/cr today; Lipids at next visit with PCP on 08/24/13 in case PCP requires other labs --> To note, on review of chart, it appears she has been very motivated in the past, so if A1c >7 at next visit, would definitely initiate therapy.

## 2013-08-16 NOTE — Progress Notes (Signed)
INTERNAL MEDICINE TEACHING ATTENDING ADDENDUM - Aldine Contes, MD: I reviewed and discussed with the resident Dr. Burnard Bunting, the patient's medical history, physical examination, diagnosis and results of pertinent tests and treatment and I agree with the patient's care as documented.

## 2013-08-16 NOTE — Assessment & Plan Note (Addendum)
BP Readings from Last 3 Encounters:  08/16/13 154/87  08/10/13 164/92  07/07/13 139/84    Lab Results  Component Value Date   NA 147* 08/10/2013   K 4.0 08/10/2013   CREATININE 1.3* 08/10/2013    Assessment: Blood pressure control: mildly elevated Progress toward BP goal:  improved  Plan: Medications:   Increase coreg to 12.5mg  BID; continue spironolactone 25, benicar 40 daily, amlodipine 5 daily; she takes lasix prn - this may need to be changed to daily, but will allow her to discuss this with PCP first.  In addition, she has potassium supp on her med list, but I'm not clear if she is taking this - I asked she bring all of her medications with her to next visit - she may benefit from simplification of her HTN meds - will clarify with pcp if spironolactone is on due to K handling?  Self management tools provided: home blood pressure meter

## 2013-08-16 NOTE — Patient Instructions (Signed)
General Instructions: -Please be sure to visit with Debera Lat, she is our diabetes educator and can give you lots of information about diet!  -Please increase your coreg (also known as carvedilol) to 12.5mg  twice daily  Please try to bring all your medicines next time. This will help [place] keep you safe from mistakes.  Should you have any new or worsening symptoms, please be sure to call the clinic at [phone].   Progress Toward Treatment Goals:  Treatment Goal 08/16/2013  Blood pressure improved    Self Care Goals & Plans:  Self Care Goal 08/16/2013  Manage my medications -  Monitor my health -  Eat healthy foods eat more vegetables; eat baked foods instead of fried foods  Be physically active take a walk every day; find an activity I enjoy  Meeting treatment goals -    Care Management & Community Referrals:  Referral 08/16/2013  Referrals made for care management support diabetes educator  Referrals made to community resources -

## 2013-08-17 ENCOUNTER — Ambulatory Visit
Admission: RE | Admit: 2013-08-17 | Discharge: 2013-08-17 | Disposition: A | Discharge status: Home / Self Care | Payer: Medicare Other | Source: Ambulatory Visit | Attending: Oncology | Admitting: Oncology

## 2013-08-17 DIAGNOSIS — C50919 Malignant neoplasm of unspecified site of unspecified female breast: Secondary | ICD-10-CM

## 2013-08-17 LAB — MICROALBUMIN / CREATININE URINE RATIO
Creatinine, Urine: 157 mg/dL
Microalb Creat Ratio: 19.4 mg/g (ref 0.0–30.0)
Microalb, Ur: 3.05 mg/dL — ABNORMAL HIGH (ref 0.00–1.89)

## 2013-08-18 ENCOUNTER — Telehealth: Payer: Self-pay | Admitting: *Deleted

## 2013-08-18 NOTE — Telephone Encounter (Signed)
Pt calls and states she has dizziness, fuzziness for appr 1 1/2 weeks, appt mon 6/1 dr Eyvonne Mechanic, she is ask to have someone take her to ED/ urg care if worse this weekend and she feels she needs to be seen, agreeable

## 2013-08-21 ENCOUNTER — Telehealth: Payer: Self-pay | Admitting: *Deleted

## 2013-08-21 ENCOUNTER — Encounter: Payer: Self-pay | Admitting: Internal Medicine

## 2013-08-21 ENCOUNTER — Ambulatory Visit (INDEPENDENT_AMBULATORY_CARE_PROVIDER_SITE_OTHER): Payer: Medicare Other | Admitting: Internal Medicine

## 2013-08-21 VITALS — BP 112/78 | HR 81

## 2013-08-21 DIAGNOSIS — R42 Dizziness and giddiness: Secondary | ICD-10-CM | POA: Insufficient documentation

## 2013-08-21 DIAGNOSIS — E119 Type 2 diabetes mellitus without complications: Secondary | ICD-10-CM

## 2013-08-21 DIAGNOSIS — I10 Essential (primary) hypertension: Secondary | ICD-10-CM

## 2013-08-21 MED ORDER — AMLODIPINE BESYLATE 2.5 MG PO TABS: 2.5000 mg | ORAL_TABLET | Freq: Every day | ORAL | Status: DC

## 2013-08-21 MED ORDER — AMLODIPINE BESYLATE 5 MG PO TABS: ORAL_TABLET | ORAL | Status: DC

## 2013-08-21 NOTE — Progress Notes (Signed)
Subjective:   Patient ID: [name] female   DOB: [birth year] 63 y.o.   MRN: [id number]  HPI: [name] is a 63 y.o. woman with PMH significant for HTN, DM, H/O Breast cancer comes to the office with CC of dizziness since last week.  Patient was seen in the clinic on 08/16/13 for the management of her DM and during that office visit, her Coreg was increased to 12.5 mg BID (from 6.25 BID). Patient reports taking 12.5 mg from 08/16/13. Patient states that ever since she increased her dose to coreg 12.5 mg BID, she has been feeling dizzy when she is walking and also when she gets up from sitting position. She denies any such symptoms in lying down position or sitting position. Patient denies any chest pain, SOB, DOE, Orthopnea, PND, swelling of legs. Patient reports compliance to all her medications.  Patient denies any other complaints during this office visit.   Past Medical History  Diagnosis Date  . Depression     seasonal melancholia  . Hyperlipidemia   . Hypertension   . Myopathy     not statin related, cause unknown   . Back pain   . Edema extremities   . Chemotherapy follow-up examination 03/06/2011  . Cough 03/06/2011  . Arthritis     DJD, lumbar spondylosis  . Bronchitis 03/10/2012  . Asthma   . GERD (gastroesophageal reflux disease)   . Cataract   . Breast cancer     left breast invasive ductal ca in remission as of 10/07/11 s/p double mastectomy   Current Outpatient Prescriptions  Medication Sig Dispense Refill  . albuterol (PROVENTIL HFA;VENTOLIN HFA) 108 (90 BASE) MCG/ACT inhaler Inhale 1-2 puffs into the lungs every 6 (six) hours as needed for wheezing or shortness of breath.  1 Inhaler  2  . albuterol (PROVENTIL,VENTOLIN) 90 MCG/ACT inhaler Inhale 2 puffs into the lungs every 4 (four) hours as needed. For wheezing      . amLODipine (NORVASC) 2.5 MG tablet Take 1 tablet (2.5 mg total) by mouth daily.  30 tablet  11  . aspirin 81 MG tablet Take 81 mg  by mouth daily.      . carvedilol (COREG) 6.25 MG tablet Take 2 tablets (12.5 mg total) by mouth 2 (two) times daily with a meal.  180 tablet  3  . cholecalciferol (VITAMIN D) 1000 UNITS tablet Take 1,000 Units by mouth daily.      . ferrous sulfate 325 (65 FE) MG tablet Take 1 tablet (325 mg total) by mouth 3 (three) times daily with meals.  90 tablet  6  . Fluticasone-Salmeterol (ADVAIR) 100-50 MCG/DOSE AEPB Inhale 1 puff into the lungs 2 (two) times daily as needed (wheezing).  60 each  1  . furosemide (LASIX) 40 MG tablet Take 40 mg by mouth as needed (excess fluid).      Marland Kitchen letrozole (FEMARA) 2.5 MG tablet TAKE 1 TABLET (2.5 MG TOTAL) BY MOUTH DAILY.  90 tablet  3  . LORazepam (ATIVAN) 1 MG tablet TAKE 1 TABLET BY MOUTH DAILY AS NEEDED FOR ANXIETY  30 tablet  1  . Multiple Vitamin (MULTIVITAMIN) tablet Take 1 tablet by mouth daily.       Marland Kitchen olmesartan (BENICAR) 40 MG tablet Take 1 tablet (40 mg total) by mouth daily.  30 tablet  5  . pantoprazole (PROTONIX) 40 MG tablet Take 1 tablet (40 mg total) by mouth 2 (two) times daily.  60 tablet  3  .  potassium chloride SA (K-DUR,KLOR-CON) 20 MEQ tablet Take 1 tablet (20 mEq total) by mouth daily. When you take Lasix  30 tablet  3  . Simethicone 125 MG CAPS Take 1 capsule (125 mg total) by mouth 4 (four) times daily as needed (gas, bloating).  90 each  2  . simvastatin (ZOCOR) 20 MG tablet TAKE 1 TABLET (20 MG TOTAL) BY MOUTH AT BEDTIME.  90 tablet  1  . spironolactone (ALDACTONE) 25 MG tablet Take 1 tablet (25 mg total) by mouth daily.  30 tablet  12  . traMADol (ULTRAM) 50 MG tablet Take 1 tablet (50 mg total) by mouth every 6 (six) hours as needed. For pain  90 tablet  3  . triamcinolone cream (KENALOG) 0.1 % Apply topically 2 (two) times daily as needed.  454 g  0  . UNABLE TO FIND Rx: L8000- Mastectomy Bra (Quantity: 6) [id number]- Mastectomy Forms Replacement, Bilateral (Quantity: 2) Dx: 174.9; Bilateral Mastectomy  1 each  0   No current  facility-administered medications for this visit.   Facility-Administered Medications Ordered in Other Visits  Medication Dose Route Frequency Provider Last Rate Last Dose  . sodium chloride 0.9 % injection 10 mL  10 mL Intravenous PRN Deatra Robinson, MD   10 mL at 01/26/11 1035   Family History  Problem Relation Age of Onset  . Heart disease Mother 50    CAD  . Breast cancer Mother   . Cancer Mother 4    Breast cancer, mother  . Cancer Father 37    colon cancer dx'ed age 73  . Coronary artery disease Other   . Multiple sclerosis Daughter   . Diabetes Father   . Diabetes Brother   . Diabetes Brother    History   Social History  . Marital Status: Single    Spouse Name: N/A    Number of Children: N/A  . Years of Education: N/A   Social History Main Topics  . Smoking status: Never Smoker   . Smokeless tobacco: Never Used  . Alcohol Use: No  . Drug Use: No  . Sexual Activity: Not on file   Other Topics Concern  . Not on file   Social History Narrative   Single, 2 kids   Never smoked   No alcohol use   No drugs   Laid off from [employer] after working there for 14 years.    Review of Systems: Pertinent items are noted in HPI. Objective:  Physical Exam: Filed Vitals:   08/21/13 0942 08/21/13 0945 08/21/13 0952  BP: 133/83 118/76 112/78  Pulse: 72 67 81   Constitutional: Vital signs reviewed.  Patient is a well-developed and well-nourished and is in no acute distress and cooperative with exam. Alert and oriented x3.  Head: Normocephalic and atraumatic Cardiovascular: RRR, S1 normal, S2 normal, no MRG, pulses symmetric and intact bilaterally Pulmonary/Chest: normal respiratory effort, CTAB, no wheezes, rales, or rhonchi Extremities: Trace pedal edema, bilaterally equal. Neurological: A&O x3 Skin: Warm, dry and intact Psychiatric: Normal mood and affect.  Assessment & Plan:

## 2013-08-21 NOTE — Assessment & Plan Note (Signed)
Patient presenting with dizziness and is orthostatic today after increasing her Coreg dose to 12.5 BID.  Plans: Reduce Amlodipine to 2.5 mg qd. Follow up in a week.

## 2013-08-21 NOTE — Telephone Encounter (Signed)
Called patient, no answer, machine not identifiable. Left message to call us back for Bone Density Results(normal-continue Vit D and Calcium)

## 2013-08-21 NOTE — Patient Instructions (Signed)
Take Amlodipine half a tablet once daily. Check your blood pressure 3 times daily, in standing position, sitting position and also when you feel dizzy.

## 2013-08-21 NOTE — Telephone Encounter (Signed)
Message copied by Verlon Setting on Mon Aug 21, 2013  3:10 PM ------      Message from: Mikey Bussing R      Created: Mon Aug 21, 2013  1:40 PM       Call pt. Bone density is normal. She may continue the Vitamin D and Calcium as already taking.            Thanks ------

## 2013-08-21 NOTE — Assessment & Plan Note (Signed)
Recent A1C of 7.1 Never been treated before and is a recent diagnosis. DM management was discussed last week and the plan is to work on weight loss, life style changes and to consider Metformin therapy in 3 months if the A1C is not improving.  Plans: Educated patient regarding the benefits of Metformin therapy in Type 2 DM. Patient is to consider Metformin therapy and to discuss with her PCP.

## 2013-08-21 NOTE — Progress Notes (Signed)
Waiting for patient to return call for instructions.

## 2013-08-21 NOTE — Assessment & Plan Note (Signed)
Suspect her dizziness secondary to Orthostatic hypotension, in the setting of recent increase in Carvedilol. Patients SBP dropped from 133 to 112 upon standing. Discussed with the attending regarding further management and plans.  Plans: Decrease the dose of Amlodipine to 2.5 mg qd. Recommended to check her BP at home 3 times daily, both in sitting and standing positions and when she feels dizzy. Recommended to bring the BP log to her next office visit. Follow up in a week to recheck her BP. Patient is agreeable to the plan.

## 2013-08-23 ENCOUNTER — Telehealth: Payer: Self-pay | Admitting: *Deleted

## 2013-08-23 NOTE — Telephone Encounter (Signed)
Message copied by Verlon Setting on Wed Aug 23, 2013 10:32 AM ------      Message from: Mikey Bussing R      Created: Mon Aug 21, 2013  1:40 PM       Call pt. Bone density is normal. She may continue the Vitamin D and Calcium as already taking.            Thanks ------

## 2013-08-23 NOTE — Progress Notes (Signed)
Case discussed with Dr. Boggala at the time of the visit.  We reviewed the resident's history and exam and pertinent patient test results.  I agree with the assessment, diagnosis, and plan of care documented in the resident's note. 

## 2013-08-23 NOTE — Telephone Encounter (Signed)
Pt left a message on triage line - decided to stop amlodipine for dizziness and feeling much better. Tried to reach pt at [phone] - left message clinic called. Hilda Blades Keil Pickering RN 08/23/13 11:30AM

## 2013-08-23 NOTE — Telephone Encounter (Signed)
Called and left message that Bone Density is normal and she should continue with Vit D and Calcium.

## 2013-08-24 ENCOUNTER — Ambulatory Visit: Payer: Medicare Other | Admitting: Dietician

## 2013-08-24 ENCOUNTER — Encounter: Payer: Medicare Other | Admitting: Internal Medicine

## 2013-08-28 ENCOUNTER — Ambulatory Visit: Payer: Medicare Other | Admitting: Internal Medicine

## 2013-08-29 ENCOUNTER — Telehealth: Payer: Self-pay

## 2013-08-29 NOTE — Telephone Encounter (Signed)
Received by mail bone density report from [facility] 08/17/13.  Copy to [place].  Original to scan.

## 2013-09-02 ENCOUNTER — Telehealth: Payer: Self-pay

## 2013-09-02 NOTE — Telephone Encounter (Signed)
Order sent on 09/02/13 to 2nd to [facility] for [place] and [id number].  Sent to scan.

## 2013-09-20 ENCOUNTER — Encounter: Payer: Self-pay | Admitting: Internal Medicine

## 2013-09-20 ENCOUNTER — Other Ambulatory Visit: Payer: Self-pay | Admitting: Internal Medicine

## 2013-09-20 ENCOUNTER — Ambulatory Visit (INDEPENDENT_AMBULATORY_CARE_PROVIDER_SITE_OTHER): Payer: Medicare Other | Admitting: Internal Medicine

## 2013-09-20 VITALS — BP 148/76 | HR 58 | Temp 97.3°F | Ht 68.0 in | Wt 226.4 lb

## 2013-09-20 DIAGNOSIS — R82998 Other abnormal findings in urine: Secondary | ICD-10-CM

## 2013-09-20 DIAGNOSIS — R8290 Unspecified abnormal findings in urine: Secondary | ICD-10-CM

## 2013-09-20 DIAGNOSIS — Z889 Allergy status to unspecified drugs, medicaments and biological substances status: Secondary | ICD-10-CM

## 2013-09-20 DIAGNOSIS — E785 Hyperlipidemia, unspecified: Secondary | ICD-10-CM

## 2013-09-20 DIAGNOSIS — I10 Essential (primary) hypertension: Secondary | ICD-10-CM

## 2013-09-20 DIAGNOSIS — Z9109 Other allergy status, other than to drugs and biological substances: Secondary | ICD-10-CM

## 2013-09-20 DIAGNOSIS — E119 Type 2 diabetes mellitus without complications: Secondary | ICD-10-CM

## 2013-09-20 LAB — LIPID PANEL
Cholesterol: 171 mg/dL (ref 0–200)
HDL: 42 mg/dL (ref >39)
LDL Cholesterol: 93 mg/dL (ref 0–99)
Total CHOL/HDL Ratio: 4.1 Ratio
Triglycerides: 182 mg/dL — ABNORMAL HIGH (ref <150)
VLDL: 36 mg/dL (ref 0–40)

## 2013-09-20 LAB — BASIC METABOLIC PANEL WITH GFR
BUN: 14 mg/dL (ref 6–23)
CO2: 28 mEq/L (ref 19–32)
Calcium: 9.5 mg/dL (ref 8.4–10.5)
Chloride: 107 mEq/L (ref 96–112)
Creat: 1.32 mg/dL — ABNORMAL HIGH (ref 0.50–1.10)
GFR, Est African American: 49 mL/min — ABNORMAL LOW
GFR, Est Non African American: 43 mL/min — ABNORMAL LOW
Glucose, Bld: 109 mg/dL — ABNORMAL HIGH (ref 70–99)
Potassium: 4.3 mEq/L (ref 3.5–5.3)
Sodium: 141 mEq/L (ref 135–145)

## 2013-09-20 LAB — GLUCOSE, CAPILLARY: Glucose-Capillary: 120 mg/dL — ABNORMAL HIGH (ref 70–99)

## 2013-09-20 MED ORDER — METFORMIN HCL 500 MG PO TABS: 500.0000 mg | ORAL_TABLET | Freq: Two times a day (BID) | ORAL | Status: DC

## 2013-09-20 MED ORDER — SPIRONOLACTONE 25 MG PO TABS: 25.0000 mg | ORAL_TABLET | Freq: Every day | ORAL | Status: DC

## 2013-09-20 MED ORDER — SIMVASTATIN 20 MG PO TABS: ORAL_TABLET | ORAL | Status: DC

## 2013-09-20 MED ORDER — CARVEDILOL 6.25 MG PO TABS: 6.2500 mg | ORAL_TABLET | Freq: Two times a day (BID) | ORAL | Status: DC

## 2013-09-20 NOTE — Progress Notes (Signed)
Subjective:    Patient ID: [name], female    DOB: 08/10/1950, 63 y.o.   MRN: [id number]  HPI Comments: 63 y.o PMH Depression, Hyperlipidemia (LDL 89 08/2012), Hypertension (controlled BP 148/76)), myopathy cause unknown, chronic Back pain 2/2 DJD, lumbar spondylosis, chronic knee pain, chronic lower extremity edema, on chemo (i.e Femara) s/p b/l masectomy for left breast cancer, Bronchitis, Asthma, GERD, Cataract, hemorrhoids, grade 1 diastolic dysfunction (0000000 EF 55-60%) has followed with Dr. Sung Amabile last 05/2013 who gave her prn Lasix and Kdur to take with prn Lasix), OSA (not of cpap yet due need for cpap titration study), plantar fascitis, chronic venous insufficiency, ext hemmrhoids, genital herpes, h/o IJ vein thrombosis, new dx diabetes 2 (HA1C 7.1 08/16/13), CKD, stasis dermatitis, h/o E.Coli UTI, and urinary incontinence.   She presents for f/u for:  1) DM 2-new dx HA1C 7.1 08/16/13. Discussed tx options.  Patient is exercising and has lost 5 lbs since last visit. Home glucose readings 1-teens to 160s w/o medications.  CBG reading today is 120 2) She c/o vaginal/urine odor like "something has died in there" after urination.  She had dysuria last Sunday prior to visit.  She is not sexually active. She uses Lever 2000 in the private area.  This odor has occurred for months 3) HLD-will repeat lipid panel today  4) Recently labs showed hypernatremia-will repeat BMET today.  Pt is staying hydrated with water  5) HTN-Today BP 148/76.  Home BP readings mid June to date 120s-150s/70s-90s most 130s-140s/80s 6) She reports a history of allergies and sinus issues when allergies are a problem.  She reports facial pressure and h/a not currently present.  She used to get allergy shots 30 years ago but is no longer.  She has not tried any OTC medications for allergies. She denies nasal congestion today.   7) She needs Rx refill of Zocor and Spironolactone.   8) She was recently seen for  lightheadedness which resolved after stopping Norvasc 2.5 mg. Denies lightheadedness today though orthostatics + 163/86 (49), 171/97 (HR 50), 143/97 (HR 63).   9) Anxiety controlled with Ativan.  She wakes up sometimes with bade dreams and feels sob due to anxiety but Ativan helps  SH: she is exercising daily to bid and has lost 5 lbs since last visit   HM: will have pt take to [facility] about retinal camera for new dx of DM, will check lipid panel today, foot exam will do at next visit  Daughter at visit today  ROS per above        Review of Systems  HENT: Positive for sinus pressure. Negative for congestion, dental problem and sore throat.   Respiratory: Positive for shortness of breath.        Sob noticed with bad dreams that awaken her from sleep  Cardiovascular: Positive for leg swelling. Negative for chest pain.       Chronic leg swelling better  Gastrointestinal: Negative for constipation.  Genitourinary: Negative for dysuria.       Vaginal odor noticed with urination   Allergic/Immunologic: Positive for environmental allergies.  Neurological: Negative for dizziness, light-headedness and headaches.       Objective:   Physical Exam  Nursing note and vitals reviewed. Constitutional: She is oriented to person, place, and time. She appears well-developed and well-nourished. She is cooperative. No distress.  HENT:  Head: Normocephalic and atraumatic.  Mouth/Throat: Oropharynx is clear and moist and mucous membranes are normal. No oropharyngeal exudate.  Eyes:  Conjunctivae are normal. Pupils are equal, round, and reactive to light. Right eye exhibits no discharge. Left eye exhibits no discharge. No scleral icterus.  Cardiovascular: Regular rhythm, S1 normal, S2 normal and normal heart sounds.  Bradycardia present.   No murmur heard. Asymptomatic bradycardia Trace lower ext edema today   Pulmonary/Chest: Effort normal and breath sounds normal. No respiratory distress. She has no  wheezes.  Abdominal: Soft. Bowel sounds are normal. There is no tenderness.  Neurological: She is alert and oriented to person, place, and time. Gait normal.  Skin: Skin is warm, dry and intact. No rash noted. She is not diaphoretic.  Psychiatric: She has a normal mood and affect. Her speech is normal and behavior is normal. Judgment and thought content normal. Cognition and memory are normal.          Assessment & Plan:  F/u in 3 months, sooner if needed

## 2013-09-20 NOTE — Patient Instructions (Addendum)
General Instructions: Please follow up in late October 2015, sooner if needed. We will recheck your diabetes level at that time.   Please take medications as prescribed and pick them up from the pharmacy I will call you about urine results within the next few days.  Try using milder soap in the private area  You can try Zyrtec 5-10 mg daily as needed for allergies (max 10 mg daily) and Tylenol as needed for headache  Take care. Good job exercising.   Schedule another follow up with [name] for nutrition counseling    Treatment Goals:  Goals (1 Years of Data) as of 09/20/13         As of Today 08/21/13 08/21/13 08/21/13 08/16/13     Blood Pressure    . Blood Pressure < 140/90  148/76 112/78 118/76 133/83 154/87      Progress Toward Treatment Goals:  Treatment Goal 09/20/2013  Hemoglobin A1C unable to assess  Blood pressure deteriorated    Self Care Goals & Plans:  Self Care Goal 09/20/2013  Manage my medications take my medicines as prescribed; bring my medications to every visit; refill my medications on time  Monitor my health keep track of my blood pressure; keep track of my weight; check my feet daily  Eat healthy foods drink diet soda or water instead of juice or soda; eat more vegetables; eat foods that are low in salt; eat baked foods instead of fried foods; eat fruit for snacks and desserts; eat smaller portions  Be physically active -  Meeting treatment goals maintain the current self-care plan    Home Blood Glucose Monitoring 09/20/2013  Check my blood sugar no home glucose monitoring  When to check my blood sugar N/A     Care Management & Community Referrals:  Referral 09/20/2013  Referrals made for care management support none needed; diabetes educator  Referrals made to community resources none       Type 2 Diabetes Mellitus, Adult Type 2 diabetes mellitus is a long-term (chronic) disease. In type 2 diabetes:  The pancreas does not make enough of a hormone called  insulin.  The cells in the body do not respond as well to the insulin that is made.  Both of the above can happen. Normally, insulin moves sugars from food into tissue cells. This gives you energy. If you have type 2 diabetes, sugars cannot be moved into tissue cells. This causes high blood sugar (hyperglycemia).  HOME CARE  Have your hemoglobin A1c level checked twice a year. The level shows if your diabetes is under control or out of control.  Perform daily blood sugar testing as told by your doctor.  Check your ketone levels by testing your pee (urine) when you are sick and as told.  Take your diabetes or insulin medicine as told by your doctor.  Never run out of insulin.  Adjust how much insulin you give yourself based on how many carbs (carbohydrates) you eat. Carbs are in many foods, such as fruits, vegetables, whole grains, and dairy products.  Have a healthy snack between every healthy meal. Have 3 meals and 3 snacks a day.  Lose weight if you are overweight.  Carry a medical alert card or wear your medical alert jewelry.  Carry a 15 gram carb snack with you at all times. Examples include:  Glucose pills, 3 or 4.  Glucose gel, 15 gram tube.  Raisins, 2 tablespoons (24 grams).  Jelly beans, 6.  Animal crackers, 8.  Sugar pop, 4 ounces (120 milliliters).  Gummy treats, 9.  Notice low blood sugar (hypoglycemia) symptoms, such as:  Shaking (tremors).  Decreased ability to think clearly.  Sweating.  Increased heart rate.  Headache.  Dry mouth.  Hunger.  Crabbiness (irritability).  Being worried or tense (anxiety).  Restless sleep.  A change in speech or coordination.  Confusion.  Treat low blood sugar right away. If you are alert and can swallow, follow the 15:15 rule:  Take 15-20 grams of a rapid-acting glucose or carb. This includes glucose gel, glucose pills, or 4 ounces (120 milliliters) of fruit juice, regular pop, or low-fat  milk.  Check your blood sugar level after taking the glucose.  Take 15-20 grams of more glucose if the repeat blood sugar level is still 70 mg/dL (milligrams/deciliter) or below.  Eat a meal or snack within 1 hour of the blood sugar levels going back to normal.  Notice early symptoms of high blood sugar, such as:  Being really thirsty or drinking a lot (polydipsia).  Peeing (urinating) a lot (polyuria).  Do at least 150 minutes of physical activity a week or as told.  Split the 150 minutes of activity up during the week. Do not do 150 minutes of activity in one day.  Perform exercises, such as weight lifting, at least 2 times a week or as told.  Adjust your insulin or food intake as needed if you start a new exercise or sport.  Follow your sick day plan when you are not able to eat or drink as usual.  Avoid tobacco use.  Women who are not pregnant should drink no more than 1 drink a day. Men should drink no more than 2 drinks a day.  Only drink alcohol with food.  Ask your doctor if alcohol is safe for you.  Tell your doctor if you drink alcohol several times during the week.  See your doctor regularly.  Schedule an eye exam soon after you are diagnosed with diabetes. Schedule exams once every year.  Check your skin and feet every day. Check for cuts, bruises, redness, nail problems, bleeding, blisters, or sores. A doctor should do a foot exam once a year.  Brush your teeth and gums twice a day. Floss once a day. Visit your dentist regularly.  Share your diabetes plan with your workplace or school.  Stay up-to-date with shots that fight against diseases (immunizations).  Learn how to manage stress.  Get diabetes education and support as needed.  Ask your doctor for special help if:  You need help to maintain or improve how you to do things on your own.  You need help to maintain or improve the quality of your life.  You have foot or hand problems.  You have  trouble cleaning yourself, dressing, eating, or doing physical activity. GET HELP RIGHT AWAY IF:  You have trouble breathing.  You have moderate to large ketone levels.  You are unable to eat food or drink fluids for more than 6 hours.  You feel sick to your stomach (nauseous) or throw up (vomit) for more than 6 hours.  Your blood sugar level is over 240 mg/dL.  There is a change in mental status.  You get another serious illness.  You have watery poop (diarrhea) for more than 6 hours.  You have been sick or have had a fever for 2 or more days and are not getting better.  You have pain when you are physically active. MAKE SURE  YOU:  Understand these instructions.  Will watch your condition.  Will get help right away if you are not doing well or get worse. Document Released: 12/17/2007 Document Revised: 12/28/2012 Document Reviewed: 10/09/2011 [facility] Patient Information 2015 [place], [place]. This information is not intended to replace advice given to you by your health care provider. Make sure you discuss any questions you have with your health care provider.  Metformin tablets What is this medicine? METFORMIN (met FOR min) is used to treat type 2 diabetes. It helps to control blood sugar. Treatment is combined with diet and exercise. This medicine can be used alone or with other medicines for diabetes. This medicine may be used for other purposes; ask your health care provider or pharmacist if you have questions. COMMON BRAND NAME(S): Glucophage What should I tell my health care provider before I take this medicine? They need to know if you have any of these conditions: -anemia -frequently drink alcohol-containing beverages -become easily dehydrated -heart attack -heart failure that is treated with medications -kidney disease -liver disease -polycystic ovary syndrome -serious infection or injury -vomiting -an unusual or allergic reaction to metformin, other  medicines, foods, dyes, or preservatives -pregnant or trying to get pregnant -breast-feeding How should I use this medicine? Take this medicine by mouth. Take it with meals. Swallow the tablets with a drink of water. Follow the directions on the prescription label. Take your medicine at regular intervals. Do not take your medicine more often than directed. Talk to your pediatrician regarding the use of this medicine in children. While this drug may be prescribed for children as young as 73 years of age for selected conditions, precautions do apply. Overdosage: If you think you have taken too much of this medicine contact a poison control center or emergency room at once. NOTE: This medicine is only for you. Do not share this medicine with others. What if I miss a dose? If you miss a dose, take it as soon as you can. If it is almost time for your next dose, take only that dose. Do not take double or extra doses. What may interact with this medicine? Do not take this medicine with any of the following medications: -dofetilide -gatifloxacin -certain contrast medicines given before X-rays, CT scans, MRI, or other procedures This medicine may also interact with the following medications: -digoxin -diuretics -female hormones, like estrogens or progestins and birth control pills -isoniazid -medicines for blood pressure, heart disease, irregular heart beat -morphine -nicotinic acid -phenothiazines like chlorpromazine, mesoridazine, prochlorperazine, thioridazine -phenytoin -procainamide -quinidine -quinine -ranitidine -steroid medicines like prednisone or cortisone -stimulant medicines for attention disorders, weight loss, or to stay awake -thyroid medicines -trimethoprim -vancomycin This list may not describe all possible interactions. Give your health care provider a list of all the medicines, herbs, non-prescription drugs, or dietary supplements you use. Also tell them if you smoke,  drink alcohol, or use illegal drugs. Some items may interact with your medicine. What should I watch for while using this medicine? Visit your doctor or health care professional for regular checks on your progress. A test called the HbA1C (A1C) will be monitored. This is a simple blood test. It measures your blood sugar control over the last 2 to 3 months. You will receive this test every 3 to 6 months. Learn how to check your blood sugar. Learn the symptoms of low and high blood sugar and how to manage them. Always carry a quick-source of sugar with you in case you have symptoms of  low blood sugar. Examples include hard sugar candy or glucose tablets. Make sure others know that you can choke if you eat or drink when you develop serious symptoms of low blood sugar, such as seizures or unconsciousness. They must get medical help at once. Tell your doctor or health care professional if you have high blood sugar. You might need to change the dose of your medicine. If you are sick or exercising more than usual, you might need to change the dose of your medicine. Do not skip meals. Ask your doctor or health care professional if you should avoid alcohol. Many nonprescription cough and cold products contain sugar or alcohol. These can affect blood sugar. This medicine may cause ovulation in premenopausal women who do not have regular monthly periods. This may increase your chances of becoming pregnant. You should not take this medicine if you become pregnant or think you may be pregnant. Talk with your doctor or health care professional about your birth control options while taking this medicine. Contact your doctor or health care professional right away if think you are pregnant. If you are going to need surgery, a MRI, CT scan, or other procedure, tell your doctor that you are taking this medicine. You may need to stop taking this medicine before the procedure. Wear a medical ID bracelet or chain, and carry a  card that describes your disease and details of your medicine and dosage times. What side effects may I notice from receiving this medicine? Side effects that you should report to your doctor or health care professional as soon as possible: -allergic reactions like skin rash, itching or hives, swelling of the face, lips, or tongue -breathing problems -feeling faint or lightheaded, falls -muscle aches or pains -signs and symptoms of low blood sugar such as feeling anxious, confusion, dizziness, increased hunger, unusually weak or tired, sweating, shakiness, cold, irritable, headache, blurred vision, fast heartbeat, loss of consciousness -slow or irregular heartbeat -unusual stomach pain or discomfort -unusually tired or weak Side effects that usually do not require medical attention (report to your doctor or health care professional if they continue or are bothersome): -diarrhea -headache -heartburn -metallic taste in mouth -nausea -stomach gas, upset This list may not describe all possible side effects. Call your doctor for medical advice about side effects. You may report side effects to FDA at 1-800-FDA-1088. Where should I keep my medicine? Keep out of the reach of children. Store at room temperature between 15 and 30 degrees C (59 and 86 degrees F). Protect from moisture and light. Throw away any unused medicine after the expiration date. NOTE: This sheet is a summary. It may not cover all possible information. If you have questions about this medicine, talk to your doctor, pharmacist, or health care provider.  2015, [employer]. (2012-06-21 16:03:44)  Allergies Allergies may happen from anything your body is sensitive to. This may be food, medicines, pollens, chemicals, and nearly anything around you in everyday life that produces allergens. An allergen is anything that causes an allergy producing substance. Heredity is often a factor in causing these problems. This means  you may have some of the same allergies as your parents. Food allergies happen in all age groups. Food allergies are some of the most severe and life threatening. Some common food allergies are cow's milk, seafood, eggs, nuts, wheat, and soybeans. SYMPTOMS   Swelling around the mouth.  An itchy red rash or hives.  Vomiting or diarrhea.  Difficulty breathing. SEVERE ALLERGIC REACTIONS ARE  LIFE-THREATENING. This reaction is called anaphylaxis. It can cause the mouth and throat to swell and cause difficulty with breathing and swallowing. In severe reactions only a trace amount of food (for example, peanut oil in a salad) may cause death within seconds. Seasonal allergies occur in all age groups. These are seasonal because they usually occur during the same season every year. They may be a reaction to molds, grass pollens, or tree pollens. Other causes of problems are house dust mite allergens, pet dander, and mold spores. The symptoms often consist of nasal congestion, a runny itchy nose associated with sneezing, and tearing itchy eyes. There is often an associated itching of the mouth and ears. The problems happen when you come in contact with pollens and other allergens. Allergens are the particles in the air that the body reacts to with an allergic reaction. This causes you to release allergic antibodies. Through a chain of events, these eventually cause you to release histamine into the blood stream. Although it is meant to be protective to the body, it is this release that causes your discomfort. This is why you were given anti-histamines to feel better. If you are unable to pinpoint the offending allergen, it may be determined by skin or blood testing. Allergies cannot be cured but can be controlled with medicine. Hay fever is a collection of all or some of the seasonal allergy problems. It may often be treated with simple over-the-counter medicine such as diphenhydramine. Take medicine as  directed. Do not drink alcohol or drive while taking this medicine. Check with your caregiver or package insert for child dosages. If these medicines are not effective, there are many new medicines your caregiver can prescribe. Stronger medicine such as nasal spray, eye drops, and corticosteroids may be used if the first things you try do not work well. Other treatments such as immunotherapy or desensitizing injections can be used if all else fails. Follow up with your caregiver if problems continue. These seasonal allergies are usually not life threatening. They are generally more of a nuisance that can often be handled using medicine. HOME CARE INSTRUCTIONS   If unsure what causes a reaction, keep a diary of foods eaten and symptoms that follow. Avoid foods that cause reactions.  If hives or rash are present:  Take medicine as directed.  You may use an over-the-counter antihistamine (diphenhydramine) for hives and itching as needed.  Apply cold compresses (cloths) to the skin or take baths in cool water. Avoid hot baths or showers. Heat will make a rash and itching worse.  If you are severely allergic:  Following a treatment for a severe reaction, hospitalization is often required for closer follow-up.  Wear a medic-alert bracelet or necklace stating the allergy.  You and your family must learn how to give adrenaline or use an anaphylaxis kit.  If you have had a severe reaction, always carry your anaphylaxis kit or EpiPen with you. Use this medicine as directed by your caregiver if a severe reaction is occurring. Failure to do so could have a fatal outcome. SEEK MEDICAL CARE IF:  You suspect a food allergy. Symptoms generally happen within 30 minutes of eating a food.  Your symptoms have not gone away within 2 days or are getting worse.  You develop new symptoms.  You want to retest yourself or your child with a food or drink you think causes an allergic reaction. Never do this if  an anaphylactic reaction to that food or drink has happened  before. Only do this under the care of a caregiver. SEEK IMMEDIATE MEDICAL CARE IF:   You have difficulty breathing, are wheezing, or have a tight feeling in your chest or throat.  You have a swollen mouth, or you have hives, swelling, or itching all over your body.  You have had a severe reaction that has responded to your anaphylaxis kit or an EpiPen. These reactions may return when the medicine has worn off. These reactions should be considered life threatening. MAKE SURE YOU:   Understand these instructions.  Will watch your condition.  Will get help right away if you are not doing well or get worse. Document Released: 06/02/2002 Document Revised: 07/04/2012 Document Reviewed: 11/07/2007 [facility] Patient Information 2015 [place], [place]. This information is not intended to replace advice given to you by your health care provider. Make sure you discuss any questions you have with your health care provider.

## 2013-09-21 DIAGNOSIS — Z889 Allergy status to unspecified drugs, medicaments and biological substances status: Secondary | ICD-10-CM | POA: Insufficient documentation

## 2013-09-21 DIAGNOSIS — R8290 Unspecified abnormal findings in urine: Secondary | ICD-10-CM | POA: Insufficient documentation

## 2013-09-21 LAB — URINALYSIS, ROUTINE W REFLEX MICROSCOPIC
Bilirubin Urine: NEGATIVE
Glucose, UA: NEGATIVE mg/dL
Hgb urine dipstick: NEGATIVE
Ketones, ur: NEGATIVE mg/dL
Nitrite: POSITIVE — AB
Protein, ur: NEGATIVE mg/dL
Specific Gravity, Urine: 1.012 (ref 1.005–1.030)
Urobilinogen, UA: 0.2 mg/dL (ref 0.0–1.0)
pH: 5.5 (ref 5.0–8.0)

## 2013-09-21 LAB — URINALYSIS, MICROSCOPIC ONLY
Bacteria, UA: NONE SEEN
Casts: NONE SEEN
Crystals: NONE SEEN

## 2013-09-21 MED ORDER — AMLODIPINE BESYLATE 2.5 MG PO TABS: 2.5000 mg | ORAL_TABLET | Freq: Every day | ORAL | Status: DC

## 2013-09-21 MED ORDER — METFORMIN HCL 500 MG PO TABS: 500.0000 mg | ORAL_TABLET | Freq: Every day | ORAL | Status: DC

## 2013-09-21 NOTE — Assessment & Plan Note (Signed)
Dizziness resolved today per pt though orthostatics just meet cut off for being + by systolic criteria.

## 2013-09-21 NOTE — Assessment & Plan Note (Addendum)
Lab Results  Component Value Date   HGBA1C 7.1 08/16/2013   HGBA1C 6.4 08/22/2012   HGBA1C 6.5 04/27/2012     Assessment: Diabetes control: good control (HgbA1C at goal) (slightly elevated ) Progress toward A1C goal:  unable to assess Comments: cbg 120 with eating only wonton sticks today  Plan: Medications:  orginally Rx Metformin 500 mg bid but speaking with patient 7/2 pt checked cbg and it was 67 w/o sx's will back down to 500 mg qd  Home glucose monitoring: Frequency: no home glucose monitoring Timing: N/A Instruction/counseling given: other instruction/counseling: will meet with Barry Brunner in the future; needs retinal exam at that time Educational resources provided: brochure;other (see comments) Self management tools provided: copy of home glucose meter download;home glucose logbook Other plans: Rx Metformin 500 mg qam, f/u in 3 months

## 2013-09-21 NOTE — Assessment & Plan Note (Signed)
BP Readings from Last 3 Encounters:  09/20/13 148/76  08/21/13 112/78  08/16/13 154/87    Lab Results  Component Value Date   NA 141 09/20/2013   K 4.3 09/20/2013   CREATININE 1.32* 09/20/2013    Assessment: Blood pressure control: mildly elevated Progress toward BP goal:  deteriorated Comments: pt stopped taking Norvasc 5 mg though last visit asked to reduce to 2.5 mg dose (due to being orthostatic on that visit)  Plan: Medications:  continue current medications (Benicar 40, Lasix 40 prn, Coreg 12.5 reduced to 6.25 mg bid due to asx'matic bradycardia to upper 40s-low 60s this office visit), Spironolactone 25 mg qd refilled today, will ask pt restart Norvasc at lower dose 2.5 mg to see if can tolerate  Educational resources provided: brochure Other plans: BMET today, will ask pt call Monday 7/6 to report blood pressures after starting Norvasc 2.5 mg

## 2013-09-21 NOTE — Assessment & Plan Note (Signed)
Patient reports h/a and sinus pressure with exposure to environmental allergens.  30+ years ago she used to get allergy shots.  She has not tried OTC medication to relieve sx's  Advised to try prn Tylenol for h/a and try Zyrtec or Claritin OTC for allergies prn

## 2013-09-21 NOTE — Assessment & Plan Note (Addendum)
Lipid Panel     Component Value Date/Time   CHOL 171 09/20/2013 1136   TRIG 182* 09/20/2013 1136   HDL 42 09/20/2013 1136   CHOLHDL 4.1 09/20/2013 1136   VLDL 36 09/20/2013 1136   LDLCALC 93 09/20/2013 1136   Will repeat lipid panel today  Rx refill of Zocor

## 2013-09-21 NOTE — Assessment & Plan Note (Signed)
Pt is not sexually active not concerned about STDS.  She has had intermittent dysuria will check UA to r/o UTI. UA appears dirty so added urine culture on 09/21/13 Encouraged patient to switch soap Lever 2000 to milder soap i.e Ammie Dalton

## 2013-09-22 NOTE — Progress Notes (Signed)
Case discussed with Dr. McLean soon after the resident saw the patient.  We reviewed the resident's history and exam and pertinent patient test results.  I agree with the assessment, diagnosis, and plan of care documented in the resident's note. 

## 2013-09-23 ENCOUNTER — Encounter: Payer: Self-pay | Admitting: Internal Medicine

## 2013-09-24 ENCOUNTER — Other Ambulatory Visit: Payer: Self-pay | Admitting: Internal Medicine

## 2013-09-24 LAB — URINE CULTURE: Colony Count: >=100000

## 2013-09-25 ENCOUNTER — Telehealth: Payer: Self-pay

## 2013-09-25 NOTE — Telephone Encounter (Signed)
Faxed dispensing order to [facility] for L8020 and [id number].  Sent to scan  

## 2013-09-25 NOTE — Telephone Encounter (Signed)
Pt seen in office on 6/1

## 2013-09-26 ENCOUNTER — Encounter: Payer: Self-pay | Admitting: Internal Medicine

## 2013-09-26 ENCOUNTER — Other Ambulatory Visit: Payer: Self-pay | Admitting: Internal Medicine

## 2013-09-26 MED ORDER — CIPROFLOXACIN HCL 500 MG PO TABS: 500.0000 mg | ORAL_TABLET | Freq: Two times a day (BID) | ORAL | Status: DC

## 2013-10-12 ENCOUNTER — Encounter: Payer: Self-pay | Admitting: Internal Medicine

## 2013-10-12 ENCOUNTER — Ambulatory Visit (INDEPENDENT_AMBULATORY_CARE_PROVIDER_SITE_OTHER): Payer: Medicare Other | Admitting: Internal Medicine

## 2013-10-12 VITALS — BP 160/80 | HR 90 | Temp 97.9°F | Wt 225.0 lb

## 2013-10-12 DIAGNOSIS — B9620 Unspecified Escherichia coli [E. coli] as the cause of diseases classified elsewhere: Secondary | ICD-10-CM

## 2013-10-12 DIAGNOSIS — A498 Other bacterial infections of unspecified site: Secondary | ICD-10-CM

## 2013-10-12 DIAGNOSIS — R1031 Right lower quadrant pain: Secondary | ICD-10-CM

## 2013-10-12 DIAGNOSIS — R51 Headache: Secondary | ICD-10-CM

## 2013-10-12 DIAGNOSIS — I10 Essential (primary) hypertension: Secondary | ICD-10-CM

## 2013-10-12 DIAGNOSIS — E119 Type 2 diabetes mellitus without complications: Secondary | ICD-10-CM

## 2013-10-12 DIAGNOSIS — N390 Urinary tract infection, site not specified: Secondary | ICD-10-CM | POA: Insufficient documentation

## 2013-10-12 LAB — CBC WITH DIFFERENTIAL/PLATELET
Basophils Absolute: 0.1 10*3/uL (ref 0.0–0.1)
Basophils Relative: 1 % (ref 0–1)
Eosinophils Absolute: 0.3 10*3/uL (ref 0.0–0.7)
Eosinophils Relative: 4 % (ref 0–5)
HCT: 32.7 % — ABNORMAL LOW (ref 36.0–46.0)
Hemoglobin: 11 g/dL — ABNORMAL LOW (ref 12.0–15.0)
Lymphocytes Relative: 37 % (ref 12–46)
Lymphs Abs: 2.4 10*3/uL (ref 0.7–4.0)
MCH: 28.1 pg (ref 26.0–34.0)
MCHC: 33.6 g/dL (ref 30.0–36.0)
MCV: 83.6 fL (ref 78.0–100.0)
Monocytes Absolute: 0.3 10*3/uL (ref 0.1–1.0)
Monocytes Relative: 5 % (ref 3–12)
Neutro Abs: 3.4 10*3/uL (ref 1.7–7.7)
Neutrophils Relative %: 53 % (ref 43–77)
Platelets: 226 10*3/uL (ref 150–400)
RBC: 3.91 MIL/uL (ref 3.87–5.11)
RDW: 13.8 % (ref 11.5–15.5)
WBC: 6.5 10*3/uL (ref 4.0–10.5)

## 2013-10-12 LAB — COMPLETE METABOLIC PANEL WITH GFR
ALT: 23 U/L (ref 0–35)
AST: 21 U/L (ref 0–37)
Albumin: 4.3 g/dL (ref 3.5–5.2)
Alkaline Phosphatase: 56 U/L (ref 39–117)
BUN: 13 mg/dL (ref 6–23)
CO2: 32 mEq/L (ref 19–32)
Calcium: 9.6 mg/dL (ref 8.4–10.5)
Chloride: 106 mEq/L (ref 96–112)
Creat: 1.14 mg/dL — ABNORMAL HIGH (ref 0.50–1.10)
GFR, Est African American: 59 mL/min — ABNORMAL LOW
GFR, Est Non African American: 51 mL/min — ABNORMAL LOW
Glucose, Bld: 95 mg/dL (ref 70–99)
Potassium: 3.7 mEq/L (ref 3.5–5.3)
Sodium: 144 mEq/L (ref 135–145)
Total Bilirubin: 0.7 mg/dL (ref 0.2–1.2)
Total Protein: 6.8 g/dL (ref 6.0–8.3)

## 2013-10-12 MED ORDER — POTASSIUM CHLORIDE CRYS ER 10 MEQ PO TBCR: 10.0000 meq | EXTENDED_RELEASE_TABLET | Freq: Every day | ORAL | Status: DC

## 2013-10-12 MED ORDER — FUROSEMIDE 40 MG PO TABS: 40.0000 mg | ORAL_TABLET | Freq: Every day | ORAL | Status: DC

## 2013-10-12 MED ORDER — METFORMIN HCL 500 MG PO TABS: 500.0000 mg | ORAL_TABLET | Freq: Every day | ORAL | Status: DC

## 2013-10-12 NOTE — Assessment & Plan Note (Signed)
She will f/u with H/O 01/2014

## 2013-10-12 NOTE — Progress Notes (Signed)
Subjective:    Patient ID: [name], female    DOB: [birth year], 63 y.o.   MRN: [id number]  HPI Comments: 63 y.o PMH Depression, Hyperlipidemia (LDL 93 09/2013), Hypertension (BP 169/95 today), myopathy cause unknown, chronic Back pain 2/2 DJD, lumbar spondylosis, chronic knee pain, chronic lower extremity edema, on chemo (i.e Femara) s/p b/l masectomy for left breast cancer, Bronchitis, Asthma, GERD, Cataract, hemorrhoids, chronic grade 1 diastolic dysfunction/heart failure (05/2012 EF 55-60%) has followed with Dr. Sung Amabile last 05/2013 who gave her prn Lasix and Kdur to take with prn Lasix), OSA (not of cpap yet due need for cpap titration study), h/o ileitis, h/o renal cysts, nonobstructive kidney stones, plantar fascitis, chronic venous insufficiency, ext hemmrhoids, genital herpes, h/o IJ vein thrombosis, new dx diabetes 2 (HA1C 7.1 08/16/13), CKD 3, stasis dermatitis, h/o recurrent E.Coli UTI, urinary incontinence.    She presents for f/u  1. She c/o of right lower abdominal pain/groin pain last noted 2 days ago when it was constant stabbing 11/10. She is not sure what made it worse but it moves from right lower quadrant to left lower quadrant intermittently.  She denies bulge in the region.  She has not tried any medication other than Tramadol which helps her pain.  Overall she has noticed this occurring since late 08/2013 but the frequency is increasing.   2. H/o recurrent E.Coli UTI. Recently tx'ed with Cipro for recurrent E.coli UTI.  She denies dysuria, frequency 3. HTN-uncontrolled today BP 169/95>160/80 on Coreg 6.25 mg bid, Benicar 40 mg qd (ARB), Spironlactone 25  Mg qd. She stopped taking Norvasc 2.5 mg qd since last visit due to blurry vision and she has taken HCTZ in the past with the same noted vision changes.  She has not been taking Lasix 40 mg prn per Dr. Sung Amabile note 05/2013. She has noticed h/a's with elevated BP's intermittently but increasing in freq.  H/A's are in  occipital to crown regions.  Last noted h/a was a couple of days ago 4. DM 2-needs Rx refill of Metformin tolerating medication okay 5. H/o breast cancer previously saw Dr. Humphrey Rolls but she is currently not in the office she will f/u in 01/2014 for breast cancer and is still taking Femara   HM -Pt recently had eye exam with Dr. Gershon Crane pending report  -will do foot exam at f/u 10/2013        Review of Systems  Constitutional: Negative for fever, chills, appetite change and unexpected weight change.  Cardiovascular:       Chronic lower ext edema  Gastrointestinal: Negative for nausea, vomiting and constipation.  Genitourinary: Negative for dysuria and frequency.       Denies urinary retention  Neurological: Positive for headaches.       Objective:   Physical Exam  Nursing note and vitals reviewed. Constitutional: She is oriented to person, place, and time. She appears well-developed and well-nourished. She is cooperative. No distress.  HENT:  Head: Normocephalic and atraumatic.  Mouth/Throat: Oropharynx is clear and moist and mucous membranes are normal. No oropharyngeal exudate.  Eyes: Conjunctivae are normal. Pupils are equal, round, and reactive to light. Right eye exhibits no discharge. Left eye exhibits no discharge. No scleral icterus.  Cardiovascular: Normal rate, regular rhythm, S1 normal, S2 normal and normal heart sounds.   No murmur heard. Pedal edema noted on exam  Pulmonary/Chest: Effort normal and breath sounds normal. No respiratory distress. She has no wheezes.  Abdominal: Soft. Bowel sounds are normal. There is  no hepatosplenomegaly, splenomegaly or hepatomegaly. There is tenderness.    No HSM  Neurological: She is alert and oriented to person, place, and time. She has normal strength. Gait normal.  CN 2-12 grossly intact   Skin: Skin is warm, dry and intact. No rash noted. She is not diaphoretic.  Psychiatric: She has a normal mood and affect. Her speech is  normal and behavior is normal. Judgment and thought content normal. Cognition and memory are normal.          Assessment & Plan:  F/u in 1 month (late August) DM, HTN, ab pain, sooner if needed

## 2013-10-12 NOTE — Assessment & Plan Note (Addendum)
Will check HA1C end of Aug 2015 Rx refill Metformin 500 mg qd-tolerating  Caution with Metformin in CKD with Cr>1.4 in females. Currently Cr. <1.4  Will do DM foot exam at f/u

## 2013-10-12 NOTE — Assessment & Plan Note (Addendum)
BP Readings from Last 3 Encounters:  10/12/13 160/80  09/20/13 148/76  08/21/13 112/78    Lab Results  Component Value Date   NA 141 09/20/2013   K 4.3 09/20/2013   CREATININE 1.32* 09/20/2013    Assessment: Blood pressure control: moderately elevated Progress toward BP goal:  deteriorated Comments: uncontrolled 169/95>160/80. BP uncontrolled after pt stopped Norvasc 2.5 mg due to side effects   Plan: Medications:  continue current medications (Coreg 6.25 mg bid (cant do higher doses b/c caused HR to drop), Benicar 40 mg qd, Spironolactone 25 mg qd).  Will have patient start taking Lasix 40 mg qd  Educational resources provided: no Self management tools provided: no Other plans: Pt to keep log of blood pressure, RTC in 1 month, check BMET at that time.  Disc'ed hydralazine initially but due to patient not being able to tolerate many medications due to side effects and increased frequency of Hydralazine will hold on adding this medication for now.  Also consider chlorthalidone but caution is used in patient with h/o DM b/c may cause may see change in glucose control.  She has tolerated Lasix in the past on an as needed basis and will have her take Lasix 40 mg qd for HTN. Dr. Sung Amabile previously instructed her to take Lasix with K 20 meq but will decrease that to 23meQ daily b/c she is on Spironlactone as well with underlying CKD 3

## 2013-10-12 NOTE — Assessment & Plan Note (Signed)
Likely related to uncontrolled HTN recently though pt has h/o migraines disc'ed prn Tylenol avoid NSAIDS, try to get BP controlled and see if that helps RTC in 1 month

## 2013-10-12 NOTE — Patient Instructions (Addendum)
General Instructions: Please follow up in 1 month sooner if needed We will get a CT scan of your abdomen/pelvis We will start you on a new medication for blood pressure  Treatment Goals:  Goals (1 Years of Data) as of 10/12/13         As of Today 09/20/13 08/21/13 08/21/13 08/21/13     Blood Pressure    . Blood Pressure < 140/90  169/95 148/76 112/78 118/76 133/83      Progress Toward Treatment Goals:  Treatment Goal 10/12/2013  Hemoglobin A1C at goal  Blood pressure deteriorated    Self Care Goals & Plans:  Self Care Goal 10/12/2013  Manage my medications take my medicines as prescribed; bring my medications to every visit; refill my medications on time  Monitor my health keep track of my blood pressure  Eat healthy foods drink diet soda or water instead of juice or soda; eat more vegetables; eat baked foods instead of fried foods; eat foods that are low in salt; eat fruit for snacks and desserts; eat smaller portions  Be physically active find an activity I enjoy  Meeting treatment goals maintain the current self-care plan    Home Blood Glucose Monitoring 09/20/2013  Check my blood sugar no home glucose monitoring  When to check my blood sugar N/A     Care Management & Community Referrals:  Referral 10/12/2013  Referrals made for care management support -  Referrals made to community resources none       Hydralazine tablets What is this medicine? HYDRALAZINE (hye DRAL a zeen) is a type of vasodilator. It relaxes blood vessels, increasing the blood and oxygen supply to your heart. This medicine is used to treat high blood pressure. This medicine may be used for other purposes; ask your health care provider or pharmacist if you have questions. COMMON BRAND NAME(S): Apresoline What should I tell my health care provider before I take this medicine? They need to know if you have any of these conditions: -blood vessel disease -heart disease including angina or history of heart  attack -kidney or liver disease -systemic lupus erythematosus (SLE) -an unusual or allergic reaction to hydralazine, tartrazine dye, other medicines, foods, dyes, or preservatives -pregnant or trying to get pregnant -breast-feeding How should I use this medicine? Take this medicine by mouth with a glass of water. Follow the directions on the prescription label. Take your doses at regular intervals. Do not take your medicine more often than directed. Do not stop taking except on the advice of your doctor or health care professional. Talk to your pediatrician regarding the use of this medicine in children. Special care may be needed. While this drug may be prescribed for children for selected conditions, precautions do apply. Overdosage: If you think you have taken too much of this medicine contact a poison control center or emergency room at once. NOTE: This medicine is only for you. Do not share this medicine with others. What if I miss a dose? If you miss a dose, take it as soon as you can. If it is almost time for your next dose, take only that dose. Do not take double or extra doses. What may interact with this medicine? -medicines for high blood pressure -medicines for mental depression This list may not describe all possible interactions. Give your health care provider a list of all the medicines, herbs, non-prescription drugs, or dietary supplements you use. Also tell them if you smoke, drink alcohol, or use illegal drugs. Some  items may interact with your medicine. What should I watch for while using this medicine? Visit your doctor or health care professional for regular checks on your progress. Check your blood pressure and pulse rate regularly. Ask your doctor or health care professional what your blood pressure and pulse rate should be and when you should contact him or her. You may get drowsy or dizzy. Do not drive, use machinery, or do anything that needs mental alertness until you  know how this medicine affects you. Do not stand or sit up quickly, especially if you are an older patient. This reduces the risk of dizzy or fainting spells. Alcohol may interfere with the effect of this medicine. Avoid alcoholic drinks. Do not treat yourself for coughs, colds, or pain while you are taking this medicine without asking your doctor or health care professional for advice. Some ingredients may increase your blood pressure. What side effects may I notice from receiving this medicine? Side effects that you should report to your doctor or health care professional as soon as possible: -chest pain, or fast or irregular heartbeat -fever, chills, or sore throat -numbness or tingling in the hands or feet -shortness of breath -skin rash, redness, blisters or itching -stiff or swollen joints -sudden weight gain -swelling of the feet or legs -swollen lymph glands -unusual weakness Side effects that usually do not require medical attention (report to your doctor or health care professional if they continue or are bothersome): -diarrhea, or constipation -headache -loss of appetite -nausea, vomiting This list may not describe all possible side effects. Call your doctor for medical advice about side effects. You may report side effects to FDA at 1-800-FDA-1088. Where should I keep my medicine? Keep out of the reach of children. Store at room temperature between 15 and 30 degrees C (59 and 86 degrees F). Throw away any unused medicine after the expiration date. NOTE: This sheet is a summary. It may not cover all possible information. If you have questions about this medicine, talk to your doctor, pharmacist, or health care provider.  2015, Elsevier/Gold Standard. (2007-07-22 15:44:58)  Abdominal Pain Many things can cause abdominal pain. Usually, abdominal pain is not caused by a disease and will improve without treatment. It can often be observed and treated at home. Your health care  provider will do a physical exam and possibly order blood tests and X-rays to help determine the seriousness of your pain. However, in many cases, more time must pass before a clear cause of the pain can be found. Before that point, your health care provider may not know if you need more testing or further treatment. HOME CARE INSTRUCTIONS  Monitor your abdominal pain for any changes. The following actions may help to alleviate any discomfort you are experiencing:  Only take over-the-counter or prescription medicines as directed by your health care provider.  Do not take laxatives unless directed to do so by your health care provider.  Try a clear liquid diet (broth, tea, or water) as directed by your health care provider. Slowly move to a bland diet as tolerated. SEEK MEDICAL CARE IF:  You have unexplained abdominal pain.  You have abdominal pain associated with nausea or diarrhea.  You have pain when you urinate or have a bowel movement.  You experience abdominal pain that wakes you in the night.  You have abdominal pain that is worsened or improved by eating food.  You have abdominal pain that is worsened with eating fatty foods.  You have  a fever. SEEK IMMEDIATE MEDICAL CARE IF:   Your pain does not go away within 2 hours.  You keep throwing up (vomiting).  Your pain is felt only in portions of the abdomen, such as the right side or the left lower portion of the abdomen.  You pass bloody or black tarry stools. MAKE SURE YOU:  Understand these instructions.   Will watch your condition.   Will get help right away if you are not doing well or get worse.  Document Released: 12/17/2004 Document Revised: 03/14/2013 Document Reviewed: 11/16/2012 [facility] Patient Information 2015 [place], [place]. This information is not intended to replace advice given to you by your health care provider. Make sure you discuss any questions you have with your health care provider.

## 2013-10-12 NOTE — Assessment & Plan Note (Signed)
Etiology unclear at this time.  Could be related to adhesions to prior abdominal surgeries (hysterectomy and oophorectomy), she has a h/o ileitis and non obstructive kidney stones found on imaging, less likely appendicitis (appedix removed). No obvious hernia on exam   Will check CMET, CBC today  She can continue prn Tramadol for pain relief if it helps alternate with Tylenol prn  Will do CT ab/pelvis with contrast disc'ed with radiologist who rec with iv and oral contrast.   RTC in 1 month

## 2013-10-12 NOTE — Assessment & Plan Note (Signed)
Sx's better after taking Cipro If continues to get recurrent E.coli UTI will refer to urology

## 2013-10-13 ENCOUNTER — Encounter: Payer: Self-pay | Admitting: Internal Medicine

## 2013-10-13 NOTE — Progress Notes (Signed)
Attending physician note: I personally interviewed and examined this patient together with resident physician Dr. Lenise Arena. She is experiencing right lower quadrant abdominal pain. She has had a previous hysterectomy with oophorectomy and incidental appendectomy. Previous bilateral mastectomies for breast cancer. Most recent colonoscopy 2 years ago. No palpable abdominal mass. No organomegaly. I suspect her symptoms are related to adhesion formation from previous pelvic surgery. In view of her cancer history, we will go ahead and get a CT scan although I think there is a low clinical likelihood that she is experiencing a recurrence. Pelvic recurrence would be most unusual for breast cancer. Murriel Hopper, M.D., [place]

## 2013-10-17 ENCOUNTER — Encounter: Payer: Self-pay | Admitting: Internal Medicine

## 2013-10-17 NOTE — Addendum Note (Signed)
Addended by: Truddie Crumble on: 10/17/2013 11:24 AM   Modules accepted: Orders

## 2013-10-19 ENCOUNTER — Encounter: Payer: Self-pay | Admitting: Internal Medicine

## 2013-10-19 DIAGNOSIS — Z0000 Encounter for general adult medical examination without abnormal findings: Secondary | ICD-10-CM | POA: Insufficient documentation

## 2013-10-23 ENCOUNTER — Ambulatory Visit (HOSPITAL_COMMUNITY)
Admission: RE | Admit: 2013-10-23 | Discharge: 2013-10-23 | Disposition: A | Discharge status: Home / Self Care | Payer: Medicare Other | Source: Ambulatory Visit | Attending: Oncology | Admitting: Oncology

## 2013-10-23 DIAGNOSIS — R1031 Right lower quadrant pain: Secondary | ICD-10-CM | POA: Insufficient documentation

## 2013-10-23 MED ORDER — IOHEXOL 300 MG/ML  SOLN
100.0000 mL | Freq: Once | INTRAMUSCULAR | Status: AC | PRN
Start: 2013-10-23 — End: 2013-10-23
  Administered 2013-10-23: 100 mL via INTRAVENOUS

## 2013-10-24 ENCOUNTER — Encounter: Payer: Self-pay | Admitting: Internal Medicine

## 2013-10-31 ENCOUNTER — Telehealth: Payer: Self-pay | Admitting: *Deleted

## 2013-10-31 NOTE — Telephone Encounter (Signed)
Call to patient given results of X ray.  Has not received letter from Dr. Aundra Dubin.  Pt voiced understanding of results and plan.  Sander Nephew, RN 10/31/2013 3:16 PM.

## 2013-11-14 ENCOUNTER — Other Ambulatory Visit: Payer: Self-pay | Admitting: Internal Medicine

## 2013-11-20 ENCOUNTER — Other Ambulatory Visit: Payer: Self-pay | Admitting: Internal Medicine

## 2013-11-29 ENCOUNTER — Ambulatory Visit (INDEPENDENT_AMBULATORY_CARE_PROVIDER_SITE_OTHER): Payer: Medicare Other | Admitting: Internal Medicine

## 2013-11-29 ENCOUNTER — Encounter: Payer: Self-pay | Admitting: Internal Medicine

## 2013-11-29 VITALS — BP 155/80 | HR 78 | Temp 98.4°F | Ht 68.0 in | Wt 223.6 lb

## 2013-11-29 DIAGNOSIS — Z0000 Encounter for general adult medical examination without abnormal findings: Secondary | ICD-10-CM

## 2013-11-29 DIAGNOSIS — I10 Essential (primary) hypertension: Secondary | ICD-10-CM

## 2013-11-29 DIAGNOSIS — B9620 Unspecified Escherichia coli [E. coli] as the cause of diseases classified elsewhere: Secondary | ICD-10-CM

## 2013-11-29 DIAGNOSIS — A498 Other bacterial infections of unspecified site: Secondary | ICD-10-CM

## 2013-11-29 DIAGNOSIS — N390 Urinary tract infection, site not specified: Secondary | ICD-10-CM

## 2013-11-29 DIAGNOSIS — Z23 Encounter for immunization: Secondary | ICD-10-CM

## 2013-11-29 MED ORDER — FUROSEMIDE 40 MG PO TABS: 40.0000 mg | ORAL_TABLET | Freq: Every day | ORAL | Status: DC

## 2013-11-29 MED ORDER — SULFAMETHOXAZOLE-TMP DS 800-160 MG PO TABS: 1.0000 | ORAL_TABLET | Freq: Two times a day (BID) | ORAL | Status: DC

## 2013-11-29 MED ORDER — CARVEDILOL 12.5 MG PO TABS: 6.2500 mg | ORAL_TABLET | Freq: Two times a day (BID) | ORAL | Status: DC

## 2013-11-29 MED ORDER — CARVEDILOL 6.25 MG PO TABS: 6.2500 mg | ORAL_TABLET | Freq: Two times a day (BID) | ORAL | Status: DC

## 2013-11-29 NOTE — Progress Notes (Signed)
Patient ID: [name], female   DOB: Jul 29, 1950, 63 y.o.   MRN: [id number]   Subjective:   Patient ID: [name] female   DOB: 13-Apr-1950 63 y.o.   MRN: [id number]  HPI: [name] is a 63 y.o. with PMH of HTN, DM2, CKD3, HLD, Depression, OSA. Presented today with complaints of burning with urination for the past week and a half. Pt denies frequency, urgency, but endorses mild supra pubic tenderness. Pt has  A hx of urinary incontinence. No fever or malaise, no vomiting or nausea. Pt say she has had several episodes this year- At least 3 now.   Past Medical History  Diagnosis Date  . Depression     seasonal melancholia  . Hyperlipidemia   . Hypertension   . Myopathy     not statin related, cause unknown   . Back pain   . Edema extremities   . Chemotherapy follow-up examination 03/06/2011  . Cough 03/06/2011  . Arthritis     DJD, lumbar spondylosis  . Bronchitis 03/10/2012  . Asthma   . GERD (gastroesophageal reflux disease)   . Cataract   . Breast cancer     left breast invasive ductal ca in remission as of 10/07/11 s/p double mastectomy  . E. coli UTI   . Ileitis   . Kidney stones     non obstructive   . Bilateral renal cysts   . Colon polyps    Current Outpatient Prescriptions  Medication Sig Dispense Refill  . albuterol (PROVENTIL HFA;VENTOLIN HFA) 108 (90 BASE) MCG/ACT inhaler Inhale 1-2 puffs into the lungs every 6 (six) hours as needed for wheezing or shortness of breath.  1 Inhaler  2  . aspirin 81 MG tablet Take 81 mg by mouth daily.      . carvedilol (COREG) 6.25 MG tablet Take 1 tablet (6.25 mg total) by mouth 2 (two) times daily with a meal.  180 tablet  3  . cholecalciferol (VITAMIN D) 1000 UNITS tablet Take 1,000 Units by mouth daily.      . ferrous sulfate 325 (65 FE) MG tablet Take 1 tablet (325 mg total) by mouth 3 (three) times daily with meals.  90 tablet  6  . Fluticasone-Salmeterol (ADVAIR) 100-50 MCG/DOSE AEPB Inhale 1 puff into  the lungs 2 (two) times daily as needed (wheezing).  60 each  1  . furosemide (LASIX) 40 MG tablet Take 1 tablet (40 mg total) by mouth daily.  30 tablet  2  . letrozole (FEMARA) 2.5 MG tablet TAKE 1 TABLET (2.5 MG TOTAL) BY MOUTH DAILY.  90 tablet  3  . LORazepam (ATIVAN) 1 MG tablet TAKE 1 TABLET BY MOUTH DAILY AS NEEDED FOR ANXIETY  30 tablet  1  . metFORMIN (GLUCOPHAGE) 500 MG tablet Take 1 tablet (500 mg total) by mouth daily with breakfast.  90 tablet  1  . metFORMIN (GLUCOPHAGE) 500 MG tablet Take 1 tablet (500 mg total) by mouth daily with breakfast.  90 tablet  0  . Multiple Vitamin (MULTIVITAMIN) tablet Take 1 tablet by mouth daily.       Marland Kitchen olmesartan (BENICAR) 40 MG tablet Take 1 tablet (40 mg total) by mouth daily.  30 tablet  5  . pantoprazole (PROTONIX) 40 MG tablet TAKE 1 TABLET (40 MG TOTAL) BY MOUTH 2 (TWO) TIMES DAILY.  60 tablet  3  . potassium chloride SA (K-DUR,KLOR-CON) 10 MEQ tablet Take 1 tablet (10 mEq total) by mouth daily. When you  take Lasix  30 tablet  3  . Simethicone 125 MG CAPS Take 1 capsule (125 mg total) by mouth 4 (four) times daily as needed (gas, bloating).  90 each  2  . simvastatin (ZOCOR) 20 MG tablet TAKE 1 TABLET (20 MG TOTAL) BY MOUTH AT BEDTIME.  90 tablet  1  . spironolactone (ALDACTONE) 25 MG tablet Take 1 tablet (25 mg total) by mouth daily.  30 tablet  5  . sulfamethoxazole-trimethoprim (BACTRIM DS) 800-160 MG per tablet Take 1 tablet by mouth 2 (two) times daily.  6 tablet  0  . traMADol (ULTRAM) 50 MG tablet Take 1 tablet (50 mg total) by mouth every 6 (six) hours as needed. For pain  90 tablet  3  . triamcinolone cream (KENALOG) 0.1 % Apply topically 2 (two) times daily as needed.  454 g  0  . UNABLE TO FIND Rx: L8000- Mastectomy Bra (Quantity: 6) [id number] Mastectomy Forms Replacement, Bilateral (Quantity: 2) Dx: 174.9; Bilateral Mastectomy  1 each  0   No current facility-administered medications for this visit.   Facility-Administered  Medications Ordered in Other Visits  Medication Dose Route Frequency Provider Last Rate Last Dose  . sodium chloride 0.9 % injection 10 mL  10 mL Intravenous PRN Deatra Robinson, MD   10 mL at 01/26/11 1035   Family History  Problem Relation Age of Onset  . Heart disease Mother 75    CAD  . Breast cancer Mother   . Cancer Mother 74    Breast cancer, mother  . Cancer Father 32    colon cancer dx'ed age 74  . Coronary artery disease Other   . Multiple sclerosis Daughter   . Diabetes Father   . Diabetes Brother   . Diabetes Brother    History   Social History  . Marital Status: Single    Spouse Name: N/A    Number of Children: N/A  . Years of Education: N/A   Social History Main Topics  . Smoking status: Never Smoker   . Smokeless tobacco: Never Used  . Alcohol Use: No  . Drug Use: No  . Sexual Activity: None   Other Topics Concern  . None   Social History Narrative   Single, 2 kids   Never smoked   No alcohol use   No drugs   Laid off from [employer] after working there for 14 years.    Review of Systems: CONSTITUTIONAL- No Fever, weightloss, night sweat or change in appetite. SKIN- No Rash, colour changes or itching. HEAD- No Headache or dizziness. EYES- No Vision loss, pain, redness, double or blurred vision. EARS- No vertigo, hearing loss or ear discharge. Mouth/throat- No Sorethroat, dentures, or bleeding gums. RESPIRATORY- No Cough or SOB. CARDIAC- No Palpitations, DOE, PND or chest pain. GI- No nausea, vomiting, diarrhoea, constipation, abd pain. NEUROLOGIC- No Numbness, syncope, seizures or burning. [facility]- Denies depression or anxiety symptoms.  Objective:  Physical Exam: Filed Vitals:   11/29/13 1604  BP: 155/80  Pulse: 78  Temp: 98.4 F (36.9 C)  TempSrc: Oral  Height: 5\' 8"  (1.727 m)  Weight: 223 lb 9.6 oz (101.424 kg)  SpO2: 100%   GENERAL- alert, co-operative, appears as stated age, not in any distress. HEENT- Atraumatic,  normocephalic, PERRL, EOMI, oral mucosa appears moist, neck supple. CARDIAC- RRR, no murmurs, rubs or gallops. RESP- Moving equal volumes of air, and clear to auscultation bilaterally, no wheezes or crackles. ABDOMEN- Soft, mild supra pubic tenderness, no  guarding or rebound, no palpable masses or organomegaly, bowel sounds present. BACK- Normal curvature of the spine, No tenderness along the vertebrae, no CVA tenderness. NEURO- No obvious Cr N abnormality, strenght upper and lower extremities- intact, Gait- Normal. EXTREMITIES- pulse 2+, symmetric, no pedal edema. SKIN- Warm, dry, No rash or lesion. PSYCH- Normal mood and affect, appropriate thought content and speech.  Assessment & Plan:   The patient's case and plan of care was discussed with attending physician, Dr. Daryll Drown.   Please see problem based charting for assessment and plan.

## 2013-11-29 NOTE — Assessment & Plan Note (Addendum)
BP Readings from Last 3 Encounters:  11/29/13 155/80  10/12/13 160/80  09/20/13 148/76    Lab Results  Component Value Date   NA 144 10/12/2013   K 3.7 10/12/2013   CREATININE 1.14* 10/12/2013    Assessment: Blood pressure control:  Uncontrolled Progress toward BP goal:   Not at goal Comments: Compliant with Coreg- 6.25mg  BID, lasix- 40mg  daily, benicar- 40mg  daily, Aldactone- 25mg  daily. Pt say she checks her Bp at home and she gets readings- systolic- in the [id number].   Plan: Medications:  continue current medications Other plans: Pt advised to come with blood pressure log next vsist. And if she is not sure about the readings on her log, to check it at [facility] or drug stores. Will not make any adjustment to Bp meds- pt did not tolerate Coreg- 12.5mg  in the past- Had orthostatic hypotension, dizziness and bradycardia to the 40s.

## 2013-11-29 NOTE — Assessment & Plan Note (Signed)
Flu shot today 

## 2013-11-29 NOTE — Assessment & Plan Note (Addendum)
Pt with dysuric symptoms today. Urine dip stick in clinic- Positive for nitrites and LE. Pts had had at least 2 episodes in the past 88mths. Last episode in 09/2013. CT abdomen done last 10/2013 showed left renal calculi- non obstructing. Increased risk of UTI in pts with incontinence and DM.  Plan- Urinalysis with reflex microscopy - Urine culture - bactrim Ds- 800-160- BID for 3 days. - Might benefit from UTI prophylaxis but need to confirm clearance of infection with repeat urine cultures. - Might also need urology refferal in the future for recurrent UTI with incontinence.  Addendum- 12/04/2013- Based on results of Urine culture- >100 000 colonies of Strep Agalactiae, Started treatment with Ampicillin 500mg  Q6H for 3 days. Pt completed bactrim treatment but this med does not cover Strep Agalactiae, cipro resistance documented but uncommon. Therefore started ampicllin.

## 2013-11-29 NOTE — Patient Instructions (Signed)
General Instructions:  Please bring your medicines with you each time you come to clinic.  Medicines may include prescription medications, over-the-counter medications, herbal remedies, eye drops, vitamins, or other pills.  We will be prescribing a medication called Bactrim for your Urinary tract infection. Take one tablet twice a day for 3 days.

## 2013-11-30 LAB — URINALYSIS, ROUTINE W REFLEX MICROSCOPIC
Bilirubin Urine: NEGATIVE
Glucose, UA: NEGATIVE mg/dL
Hgb urine dipstick: NEGATIVE
Ketones, ur: NEGATIVE mg/dL
Nitrite: NEGATIVE
Protein, ur: NEGATIVE mg/dL
Specific Gravity, Urine: 1.014 (ref 1.005–1.030)
Urobilinogen, UA: 0.2 mg/dL (ref 0.0–1.0)
pH: 5.5 (ref 5.0–8.0)

## 2013-11-30 LAB — URINALYSIS, MICROSCOPIC ONLY
Bacteria, UA: NONE SEEN
Casts: NONE SEEN
Crystals: NONE SEEN

## 2013-12-01 LAB — URINE CULTURE: Colony Count: >=100000

## 2013-12-01 NOTE — Addendum Note (Signed)
Addended by: Bethena Roys on: 12/01/2013 04:55 PM   Modules accepted: Orders, Medications

## 2013-12-04 ENCOUNTER — Ambulatory Visit (HOSPITAL_COMMUNITY)
Admission: RE | Admit: 2013-12-04 | Discharge: 2013-12-04 | Disposition: A | Discharge status: Home / Self Care | Payer: Medicare Other | Source: Ambulatory Visit | Attending: Internal Medicine | Admitting: Internal Medicine

## 2013-12-04 VITALS — BP 134/78 | HR 81 | Wt 222.5 lb

## 2013-12-04 DIAGNOSIS — G729 Myopathy, unspecified: Secondary | ICD-10-CM | POA: Insufficient documentation

## 2013-12-04 DIAGNOSIS — R05 Cough: Secondary | ICD-10-CM | POA: Diagnosis not present

## 2013-12-04 DIAGNOSIS — C50919 Malignant neoplasm of unspecified site of unspecified female breast: Secondary | ICD-10-CM | POA: Diagnosis not present

## 2013-12-04 DIAGNOSIS — Z170 Estrogen receptor positive status [ER+]: Secondary | ICD-10-CM | POA: Insufficient documentation

## 2013-12-04 DIAGNOSIS — Z7982 Long term (current) use of aspirin: Secondary | ICD-10-CM | POA: Insufficient documentation

## 2013-12-04 DIAGNOSIS — F3289 Other specified depressive episodes: Secondary | ICD-10-CM | POA: Insufficient documentation

## 2013-12-04 DIAGNOSIS — M47817 Spondylosis without myelopathy or radiculopathy, lumbosacral region: Secondary | ICD-10-CM | POA: Diagnosis not present

## 2013-12-04 DIAGNOSIS — N281 Cyst of kidney, acquired: Secondary | ICD-10-CM | POA: Insufficient documentation

## 2013-12-04 DIAGNOSIS — I10 Essential (primary) hypertension: Secondary | ICD-10-CM | POA: Diagnosis not present

## 2013-12-04 DIAGNOSIS — Z9010 Acquired absence of unspecified breast and nipple: Secondary | ICD-10-CM | POA: Diagnosis not present

## 2013-12-04 DIAGNOSIS — H269 Unspecified cataract: Secondary | ICD-10-CM | POA: Insufficient documentation

## 2013-12-04 DIAGNOSIS — E669 Obesity, unspecified: Secondary | ICD-10-CM | POA: Insufficient documentation

## 2013-12-04 DIAGNOSIS — Z79899 Other long term (current) drug therapy: Secondary | ICD-10-CM | POA: Diagnosis not present

## 2013-12-04 DIAGNOSIS — R059 Cough, unspecified: Secondary | ICD-10-CM | POA: Insufficient documentation

## 2013-12-04 DIAGNOSIS — K219 Gastro-esophageal reflux disease without esophagitis: Secondary | ICD-10-CM | POA: Diagnosis not present

## 2013-12-04 DIAGNOSIS — I5032 Chronic diastolic (congestive) heart failure: Secondary | ICD-10-CM | POA: Diagnosis not present

## 2013-12-04 DIAGNOSIS — J45909 Unspecified asthma, uncomplicated: Secondary | ICD-10-CM | POA: Diagnosis not present

## 2013-12-04 DIAGNOSIS — E119 Type 2 diabetes mellitus without complications: Secondary | ICD-10-CM | POA: Diagnosis not present

## 2013-12-04 DIAGNOSIS — D126 Benign neoplasm of colon, unspecified: Secondary | ICD-10-CM | POA: Diagnosis not present

## 2013-12-04 DIAGNOSIS — R609 Edema, unspecified: Secondary | ICD-10-CM | POA: Insufficient documentation

## 2013-12-04 DIAGNOSIS — E785 Hyperlipidemia, unspecified: Secondary | ICD-10-CM | POA: Insufficient documentation

## 2013-12-04 DIAGNOSIS — Z8673 Personal history of transient ischemic attack (TIA), and cerebral infarction without residual deficits: Secondary | ICD-10-CM | POA: Diagnosis not present

## 2013-12-04 DIAGNOSIS — F329 Major depressive disorder, single episode, unspecified: Secondary | ICD-10-CM | POA: Insufficient documentation

## 2013-12-04 DIAGNOSIS — I509 Heart failure, unspecified: Secondary | ICD-10-CM | POA: Insufficient documentation

## 2013-12-04 LAB — BASIC METABOLIC PANEL
Anion gap: 12 (ref 5–15)
BUN: 21 mg/dL (ref 6–23)
CO2: 27 mEq/L (ref 19–32)
Calcium: 10.2 mg/dL (ref 8.4–10.5)
Chloride: 104 mEq/L (ref 96–112)
Creatinine, Ser: 1.84 mg/dL — ABNORMAL HIGH (ref 0.50–1.10)
GFR calc Af Amer: 32 mL/min — ABNORMAL LOW (ref >90)
GFR calc non Af Amer: 28 mL/min — ABNORMAL LOW (ref >90)
Glucose, Bld: 93 mg/dL (ref 70–99)
Potassium: 4.9 mEq/L (ref 3.7–5.3)
Sodium: 143 mEq/L (ref 137–147)

## 2013-12-04 MED ORDER — AMOXICILLIN 500 MG PO CAPS: 500.0000 mg | ORAL_CAPSULE | Freq: Four times a day (QID) | ORAL | Status: DC

## 2013-12-04 NOTE — Addendum Note (Signed)
Encounter addended by: Scarlette Calico, RN on: 12/04/2013  2:35 PM<BR>     Documentation filed: Patient Instructions Section, Orders

## 2013-12-04 NOTE — Progress Notes (Signed)
Patient ID: [name], female   DOB: 10-17-1950, 63 y.o.   MRN: [id number]    HPI: [name] is a 63 y/o women with h/o obesity, HTN, asthma and HL. She also has history of TIA in April 2012.    Found to have L ductal breast CA. ER/PR +. HER 2 neu was equivocal. s/p bilateral mastectomies in 01/2010. Lymph nodes negative.   She completed herceptin therapy Jan. 2013.  Echo April 10, 2010 EF 55-60%. Grade 1 diastolic dusfunction. MUGA 2/12 EF 73%  Echo April 9,2012 EF 60-65% lat s' 10.43 (? Artifact) Echo 12/13/10 60-65% poor windows for lat s' peak I see is 8.09 ECHO 02/24/12 EF 55-60% lateral s' 6.5 ECHO 07/02/11 EF 55-60% lateral S' 6.9 RV mildly dilated but normal function Echo 05/30/2012: EF 60-65% lat s' 7.1, Grade 1 diastolic dysfunction September 2012 we changed HCTZ to lasix for longstanding LE edema (since her daughter was born in 70).  Myoview 11/14 LV Ejection Fraction: 68%. LV Wall Motion: NL LV Function; NL Wall Motion   Returns for f/u. Feels good. No more CP or dyspnea. + edema. [facility] - [place] at least 1 mile. Has lost 20 pounds in 6 months. Recently diagnosed with DM2. Started on metformin. Takes lasix 40 daily.    ROS: All systems negative except as listed in HPI, PMH and Problem List.  Past Medical History  Diagnosis Date  . Depression     seasonal melancholia  . Hyperlipidemia   . Hypertension   . Myopathy     not statin related, cause unknown   . Back pain   . Edema extremities   . Chemotherapy follow-up examination 03/06/2011  . Cough 03/06/2011  . Arthritis     DJD, lumbar spondylosis  . Bronchitis 03/10/2012  . Asthma   . GERD (gastroesophageal reflux disease)   . Cataract   . Breast cancer     left breast invasive ductal ca in remission as of 10/07/11 s/p double mastectomy  . E. coli UTI   . Ileitis   . Kidney stones     non obstructive   . Bilateral renal cysts   . Colon polyps    Current Outpatient Prescriptions on File  Prior to Encounter  Medication Sig Dispense Refill  . albuterol (PROVENTIL HFA;VENTOLIN HFA) 108 (90 BASE) MCG/ACT inhaler Inhale 1-2 puffs into the lungs every 6 (six) hours as needed for wheezing or shortness of breath.  1 Inhaler  2  . aspirin 81 MG tablet Take 81 mg by mouth daily.      . carvedilol (COREG) 6.25 MG tablet Take 1 tablet (6.25 mg total) by mouth 2 (two) times daily with a meal.  180 tablet  3  . cholecalciferol (VITAMIN D) 1000 UNITS tablet Take 1,000 Units by mouth daily.      . ferrous sulfate 325 (65 FE) MG tablet Take 1 tablet (325 mg total) by mouth 3 (three) times daily with meals.  90 tablet  6  . Fluticasone-Salmeterol (ADVAIR) 100-50 MCG/DOSE AEPB Inhale 1 puff into the lungs 2 (two) times daily as needed (wheezing).  60 each  1  . furosemide (LASIX) 40 MG tablet Take 1 tablet (40 mg total) by mouth daily.  30 tablet  2  . letrozole (FEMARA) 2.5 MG tablet TAKE 1 TABLET (2.5 MG TOTAL) BY MOUTH DAILY.  90 tablet  3  . LORazepam (ATIVAN) 1 MG tablet TAKE 1 TABLET BY MOUTH DAILY AS NEEDED  FOR ANXIETY  30 tablet  1  . metFORMIN (GLUCOPHAGE) 500 MG tablet Take 1 tablet (500 mg total) by mouth daily with breakfast.  90 tablet  0  . Multiple Vitamin (MULTIVITAMIN) tablet Take 1 tablet by mouth daily.       Marland Kitchen olmesartan (BENICAR) 40 MG tablet Take 1 tablet (40 mg total) by mouth daily.  30 tablet  5  . pantoprazole (PROTONIX) 40 MG tablet TAKE 1 TABLET (40 MG TOTAL) BY MOUTH 2 (TWO) TIMES DAILY.  60 tablet  3  . potassium chloride SA (K-DUR,KLOR-CON) 10 MEQ tablet Take 1 tablet (10 mEq total) by mouth daily. When you take Lasix  30 tablet  3  . Simethicone 125 MG CAPS Take 1 capsule (125 mg total) by mouth 4 (four) times daily as needed (gas, bloating).  90 each  2  . simvastatin (ZOCOR) 20 MG tablet TAKE 1 TABLET (20 MG TOTAL) BY MOUTH AT BEDTIME.  90 tablet  1  . spironolactone (ALDACTONE) 25 MG tablet Take 1 tablet (25 mg total) by mouth daily.  30 tablet  5  . traMADol  (ULTRAM) 50 MG tablet Take 1 tablet (50 mg total) by mouth every 6 (six) hours as needed. For pain  90 tablet  3  . triamcinolone cream (KENALOG) 0.1 % Apply topically 2 (two) times daily as needed.  454 g  0  . UNABLE TO FIND Rx: L8000- Mastectomy Bra (Quantity: 6) [id number] Mastectomy Forms Replacement, Bilateral (Quantity: 2) Dx: 174.9; Bilateral Mastectomy  1 each  0   Current Facility-Administered Medications on File Prior to Encounter  Medication Dose Route Frequency Provider Last Rate Last Dose  . sodium chloride 0.9 % injection 10 mL  10 mL Intravenous PRN Deatra Robinson, MD   10 mL at 01/26/11 1035    Filed Vitals:   12/04/13 1358  BP: 134/78  Pulse: 81  Weight: 222 lb 8 oz (100.925 kg)  SpO2: 99%   PHYSICAL EXAM: General:  Well appearing. No resp difficulty; daughters present HEENT: normal Neck: supple. JVP 5-6. Carotids 2+ bilaterally; no bruits. No lymphadenopathy or thryomegaly appreciated. Cor: PMI normal. Regular rate & rhythm. No rubs, murmurs; tender to palpate chest on R side Lungs: clear Abdomen: obese, soft, nontender, nondistended. No hepatosplenomegaly. No bruits or masses. Good bowel sounds. Extremities: no cyanosis, clubbing, rash.  No lower extremity edema Neuro: alert & orientedx3, cranial nerves grossly intact. Moves all 4 extremities w/o difficulty. Affect pleasant.  ASSESSMENT & PLAN:  1) CP- Lexi Scan Myoview 11/14 was negative. CP resolved. No further work-up at this point.  2) Chronic diastolic HF - volume status much improved. Encouraged her to continue diet and exercise regimen.   3) Breast cancer - finished chemo now in remission.  4) HTN - Blood pressure well controlled. Continue current regimen.  F/u 6 months.    [name] MD 2:21 PM

## 2013-12-04 NOTE — Progress Notes (Signed)
Internal Medicine Clinic Attending Date of visit: 11/29/2013   Case discussed with Dr. Denton Brick soon after the resident saw the patient.  We reviewed the resident's history and exam and pertinent patient test results.  I agree with the assessment, diagnosis, and plan of care documented in the resident's note.

## 2013-12-04 NOTE — Patient Instructions (Signed)
Lab today  We will contact you in 6 months to schedule your next appointment.  

## 2013-12-04 NOTE — Addendum Note (Signed)
Addended by: Bethena Roys on: 12/04/2013 01:25 PM   Modules accepted: Orders, Medications

## 2013-12-06 ENCOUNTER — Encounter: Payer: Self-pay | Admitting: Pulmonary Disease

## 2013-12-06 ENCOUNTER — Ambulatory Visit (INDEPENDENT_AMBULATORY_CARE_PROVIDER_SITE_OTHER): Payer: Medicare Other | Admitting: Pulmonary Disease

## 2013-12-06 VITALS — BP 152/92 | HR 79 | Ht 68.0 in | Wt 224.0 lb

## 2013-12-06 DIAGNOSIS — J45991 Cough variant asthma: Secondary | ICD-10-CM

## 2013-12-06 DIAGNOSIS — G4733 Obstructive sleep apnea (adult) (pediatric): Secondary | ICD-10-CM

## 2013-12-06 MED ORDER — FLUTICASONE-SALMETEROL 100-50 MCG/DOSE IN AEPB: 1.0000 | INHALATION_SPRAY | Freq: Two times a day (BID) | RESPIRATORY_TRACT | Status: DC | PRN

## 2013-12-06 NOTE — Progress Notes (Signed)
   Subjective:    Patient ID: [name], female    DOB: January 31, 1951, 63 y.o.   MRN: [id number]  HPI  62/F, never smoker, for FU of OSA & recurrent cough & wheezing  Attributed to cough variant asthma , sinuses & GERD  She reported intermittent wheezing especially in spring & fall for 3 yrs , wheezing was noted in the clinic in 01/2011 and she was given a diagnosis of asthma. She takes advair only seasonally. Off daily advair since 1/ 2014.  She is s/p chemotherapy for Breast cancer (Dr Humphrey Rolls) . Grade 1 diastolic dysfunction noted by Dr Haroldine Laws on echo. She reports pedal edema & takes lasix daily. She was on lisinopril for about 2 yrs - changed to olmesartan  Reviewed spirometry 9/09 wnl & 9/12 -no obstruction. Methacholine challenge 9/09 drop in smaller airways >> added singulair   Head CT 9/12 showed Ethmoid sinus mucosal thickening and bubbly opacity in the left sphenoid  PSG 06/2012 did show obstructive sleep apnea -  she felt very claustrophobic,she could not afford the CPAP - hence abandoned    12/06/2013  Chief Complaint  Patient presents with  . Follow-up    Yearly f/u. Pt states she had slight wheezing last night and she used her advair and it resolved. Pt denies SOB, cough and CP/tightness.    Was doing well of Advair, breathing worse over the last 2 days, and started back again with good results Denies cough sputum production or pedal edema, UTi & cr rising on labs 9/14 - Note she is on daily Lasix and Aldactone   Review of Systems neg for any significant sore throat, dysphagia, itching, sneezing, nasal congestion or excess/ purulent secretions, fever, chills, sweats, unintended wt loss, pleuritic or exertional cp, hempoptysis, orthopnea pnd or change in chronic leg swelling. Also denies presyncope, palpitations, heartburn, abdominal pain, nausea, vomiting, diarrhea or change in bowel or urinary habits, dysuria,hematuria, rash, arthralgias, visual complaints, headache,  numbness weakness or ataxia.     Objective:   Physical Exam  Gen. Pleasant, obese, in no distress ENT - no lesions, no post nasal drip Neck: No JVD, no thyromegaly, no carotid bruits Lungs: no use of accessory muscles, no dullness to percussion, decreased without rales or rhonchi  Cardiovascular: Rhythm regular, heart sounds  normal, no murmurs or gallops, no peripheral edema Musculoskeletal: No deformities, no cyanosis or clubbing , no tremors       Assessment & Plan:

## 2013-12-06 NOTE — Patient Instructions (Signed)
Take advair once daily during fall 3 refills on advair Call as needed

## 2013-12-07 ENCOUNTER — Telehealth (HOSPITAL_COMMUNITY): Payer: Self-pay | Admitting: *Deleted

## 2013-12-07 DIAGNOSIS — I10 Essential (primary) hypertension: Secondary | ICD-10-CM

## 2013-12-07 MED ORDER — FUROSEMIDE 40 MG PO TABS: ORAL_TABLET | ORAL | Status: DC

## 2013-12-07 NOTE — Telephone Encounter (Signed)
Message copied by Scarlette Calico on Thu Dec 07, 2013  5:10 PM ------      Message from: Jolaine Artist      Created: Mon Dec 04, 2013  8:52 PM       Creatinine up. Cut lasix back to M/W/F. Repeat 1 week. ------

## 2013-12-07 NOTE — Telephone Encounter (Signed)
Pt aware, labs 9/29

## 2013-12-07 NOTE — Assessment & Plan Note (Signed)
Weight loss encouraged Advised against medications with sedative side effects Cautioned against driving when sleepy - understanding that sleepiness will vary on a day to day basis

## 2013-12-07 NOTE — Assessment & Plan Note (Signed)
Take advair once daily during fall 3 refills on advair Call as needed

## 2013-12-12 ENCOUNTER — Encounter: Payer: Self-pay | Admitting: Internal Medicine

## 2013-12-12 ENCOUNTER — Ambulatory Visit (INDEPENDENT_AMBULATORY_CARE_PROVIDER_SITE_OTHER): Payer: Medicare Other | Admitting: Internal Medicine

## 2013-12-12 ENCOUNTER — Other Ambulatory Visit (HOSPITAL_COMMUNITY)
Admission: RE | Admit: 2013-12-12 | Discharge: 2013-12-12 | Disposition: A | Discharge status: Home / Self Care | Payer: Medicare Other | Source: Ambulatory Visit | Attending: Internal Medicine | Admitting: Internal Medicine

## 2013-12-12 VITALS — BP 151/71 | HR 82 | Temp 98.4°F | Ht 68.0 in | Wt 222.4 lb

## 2013-12-12 DIAGNOSIS — N760 Acute vaginitis: Secondary | ICD-10-CM | POA: Diagnosis present

## 2013-12-12 DIAGNOSIS — N898 Other specified noninflammatory disorders of vagina: Secondary | ICD-10-CM

## 2013-12-12 DIAGNOSIS — N814 Uterovaginal prolapse, unspecified: Secondary | ICD-10-CM

## 2013-12-12 DIAGNOSIS — R300 Dysuria: Secondary | ICD-10-CM

## 2013-12-12 DIAGNOSIS — L293 Anogenital pruritus, unspecified: Secondary | ICD-10-CM

## 2013-12-12 MED ORDER — FLUCONAZOLE 150 MG PO TABS: ORAL_TABLET | ORAL | Status: DC

## 2013-12-12 NOTE — Patient Instructions (Signed)
-  You may have a vaginal yeast infection: Take Diflucan 150mg  tablet today and another one in 3 days.  -You have been referred to a Editor, commissioning. They will call you with your appointment.  -We will call you if your urine culture is positive for a urinary tract infection.  -Follow up in 1 month for diabetes and blood pressure monitoring.   -It was great meeting you today!  Please bring your medicines with you each time you come.   Medicines may be  Eye drops  Herbal   Vitamins  Pills  Seeing these help [place] take care of you.

## 2013-12-12 NOTE — Progress Notes (Signed)
   Subjective:    Patient ID: [name], female    DOB: February 03, 1951, 63 y.o.   MRN: [id number]  HPI [name] is a 63 year old woman with PMH of HTN, DM2, CKD3, HLD, depression, OSA who presents today for evaluation of dysuria, pain and tenderness in the urogenital area, as well as itching.  She completed her amoxicillin UTI treatment last week but states that she started feeling "raw" in her vaginal area the same day she finished the treatment. She is not sexually active, does no use vaginal douching. She does use scented soaps. She has hx of surgery for prolapsed bladder. She had two pregnancies with non complicated vaginal deliveries.    Review of Systems  Constitutional: Negative for fever, chills, diaphoresis, activity change, appetite change, fatigue and unexpected weight change.  Gastrointestinal: Negative for abdominal pain.  Genitourinary: Positive for dysuria and vaginal pain. Negative for flank pain, vaginal bleeding, vaginal discharge and difficulty urinating.  Musculoskeletal: Negative for back pain.  Skin: Negative for rash.  Neurological: Negative for dizziness, weakness and light-headedness.       Objective:   Physical Exam  Nursing note and vitals reviewed. Constitutional: She is oriented to person, place, and time. She appears well-developed and well-nourished. No distress.  Cardiovascular: Normal rate and regular rhythm.   Pulmonary/Chest: Effort normal. No respiratory distress.  Abdominal: Soft. She exhibits no distension. There is no tenderness.  Genitourinary: No vaginal discharge found.  Pelvic exam revealed prolapsed uterus with cervix ~2cm  from the vaginal introitus with vaginal inflammation, thin, scant whit coating of labia minora.  She had severe TTP of the cervix and vaginal wall with no discharge.  Vulva with no vesicles, papules, masses, or ulcerations noted.     Musculoskeletal: She exhibits no edema and no tenderness.  Neurological:  She is alert and oriented to person, place, and time.  Skin: Skin is warm and dry. No rash noted. She is not diaphoretic.  Psychiatric: She has a normal mood and affect.          Assessment & Plan:

## 2013-12-13 ENCOUNTER — Other Ambulatory Visit: Payer: Self-pay | Admitting: *Deleted

## 2013-12-13 DIAGNOSIS — R300 Dysuria: Secondary | ICD-10-CM | POA: Insufficient documentation

## 2013-12-13 DIAGNOSIS — N814 Uterovaginal prolapse, unspecified: Secondary | ICD-10-CM | POA: Insufficient documentation

## 2013-12-13 DIAGNOSIS — N898 Other specified noninflammatory disorders of vagina: Secondary | ICD-10-CM | POA: Insufficient documentation

## 2013-12-13 LAB — CERVICOVAGINAL ANCILLARY ONLY
Wet Prep (BD Affirm): NEGATIVE
Wet Prep (BD Affirm): NEGATIVE
Wet Prep (BD Affirm): NEGATIVE

## 2013-12-13 NOTE — Assessment & Plan Note (Signed)
She described feeling "tender" and "raw" in her urogenital area, which worsens with urination. Urine dipstick nitrite negative with small leucocytosis with UTI unlikely. Previous urine culture for early this month with GBS, she finished amoxicillin as prescribed. Her sx may be due to vaginal atrophy v vaginal yeast infx.  Sent for urine culture.

## 2013-12-13 NOTE — Assessment & Plan Note (Signed)
Cervix ~2cm from vaginal introitus, she has hx of prolapsed bladder s/p bladder repair surgery years ago.  She may also have vaginal dryness and atrophy with inflammation of the vaginal wall but no clear lesions or vesicles c/w genital herpes.  Referred to GYN for further evaluation and treatment.

## 2013-12-13 NOTE — Assessment & Plan Note (Signed)
Wet prep done which resulted after her visit and revealed no trichomonas, no candida, no gardnerella.  Given her sx of itching and recent abx use, vaginal yeast infection is likely and she was given Rx for Diflucan 150mg  once and in 72 hours.  -Referred to GYN as the itching may be due to vaginal dryness but given her hx of breast cancer, vaginal estrogen cream may not be ideal for her but will defer this decision to her Gynecologist.

## 2013-12-14 LAB — URINE CULTURE: Colony Count: 4000

## 2013-12-15 ENCOUNTER — Other Ambulatory Visit: Payer: Self-pay | Admitting: *Deleted

## 2013-12-15 DIAGNOSIS — I10 Essential (primary) hypertension: Secondary | ICD-10-CM

## 2013-12-15 NOTE — Progress Notes (Signed)
INTERNAL MEDICINE TEACHING ATTENDING ADDENDUM - Alexandrina Fiorini, MD: I reviewed and discussed at the time of visit with the resident Dr. Kennerly, the patient's medical history, physical examination, diagnosis and results of pertinent tests and treatment and I agree with the patient's care as documented.  

## 2013-12-16 ENCOUNTER — Telehealth: Payer: Self-pay | Admitting: Internal Medicine

## 2013-12-16 NOTE — Telephone Encounter (Signed)
Left message for pt to hold K and Metformin until gets repeat labs 9/29.  From there will reassess need for Metformin as contraindicated in women with Creatinine >1.4. May have to change to another oral agent.    Aundra Dubin MD

## 2013-12-19 ENCOUNTER — Inpatient Hospital Stay (HOSPITAL_COMMUNITY): Admission: RE | Admit: 2013-12-19 | Payer: Medicare Other | Source: Ambulatory Visit

## 2013-12-20 ENCOUNTER — Ambulatory Visit (HOSPITAL_COMMUNITY)
Admission: RE | Admit: 2013-12-20 | Discharge: 2013-12-20 | Disposition: A | Discharge status: Home / Self Care | Payer: Medicare Other | Source: Ambulatory Visit | Attending: Internal Medicine | Admitting: Internal Medicine

## 2013-12-20 DIAGNOSIS — I5032 Chronic diastolic (congestive) heart failure: Secondary | ICD-10-CM | POA: Diagnosis not present

## 2013-12-20 DIAGNOSIS — I5022 Chronic systolic (congestive) heart failure: Secondary | ICD-10-CM

## 2013-12-20 LAB — BASIC METABOLIC PANEL
Anion gap: 12 (ref 5–15)
BUN: 16 mg/dL (ref 6–23)
CO2: 27 mEq/L (ref 19–32)
Calcium: 9.8 mg/dL (ref 8.4–10.5)
Chloride: 107 mEq/L (ref 96–112)
Creatinine, Ser: 1.35 mg/dL — ABNORMAL HIGH (ref 0.50–1.10)
GFR calc Af Amer: 47 mL/min — ABNORMAL LOW (ref >90)
GFR calc non Af Amer: 41 mL/min — ABNORMAL LOW (ref >90)
Glucose, Bld: 102 mg/dL — ABNORMAL HIGH (ref 70–99)
Potassium: 4.4 mEq/L (ref 3.7–5.3)
Sodium: 146 mEq/L (ref 137–147)

## 2013-12-26 ENCOUNTER — Encounter: Payer: Self-pay | Admitting: Internal Medicine

## 2013-12-27 ENCOUNTER — Ambulatory Visit (INDEPENDENT_AMBULATORY_CARE_PROVIDER_SITE_OTHER): Payer: Medicare Other | Admitting: General Surgery

## 2014-01-18 ENCOUNTER — Encounter: Payer: Self-pay | Admitting: Internal Medicine

## 2014-01-18 ENCOUNTER — Ambulatory Visit (INDEPENDENT_AMBULATORY_CARE_PROVIDER_SITE_OTHER): Payer: Medicare Other | Admitting: Internal Medicine

## 2014-01-18 VITALS — BP 163/90 | HR 78 | Temp 98.2°F | Ht 68.0 in | Wt 224.2 lb

## 2014-01-18 DIAGNOSIS — E119 Type 2 diabetes mellitus without complications: Secondary | ICD-10-CM

## 2014-01-18 DIAGNOSIS — R519 Headache, unspecified: Secondary | ICD-10-CM

## 2014-01-18 DIAGNOSIS — R51 Headache: Secondary | ICD-10-CM

## 2014-01-18 DIAGNOSIS — I10 Essential (primary) hypertension: Secondary | ICD-10-CM

## 2014-01-18 DIAGNOSIS — Z0000 Encounter for general adult medical examination without abnormal findings: Secondary | ICD-10-CM

## 2014-01-18 DIAGNOSIS — Z23 Encounter for immunization: Secondary | ICD-10-CM

## 2014-01-18 LAB — BASIC METABOLIC PANEL WITH GFR
BUN: 14 mg/dL (ref 6–23)
CO2: 27 mEq/L (ref 19–32)
Calcium: 10 mg/dL (ref 8.4–10.5)
Chloride: 105 mEq/L (ref 96–112)
Creat: 1.31 mg/dL — ABNORMAL HIGH (ref 0.50–1.10)
GFR, Est African American: 49 mL/min — ABNORMAL LOW
GFR, Est Non African American: 43 mL/min — ABNORMAL LOW
Glucose, Bld: 82 mg/dL (ref 70–99)
Potassium: 4 mEq/L (ref 3.5–5.3)
Sodium: 143 mEq/L (ref 135–145)

## 2014-01-18 LAB — GLUCOSE, CAPILLARY: Glucose-Capillary: 111 mg/dL — ABNORMAL HIGH (ref 70–99)

## 2014-01-18 LAB — POCT GLYCOSYLATED HEMOGLOBIN (HGB A1C): Hemoglobin A1C: 5.9

## 2014-01-18 MED ORDER — BLOOD PRESSURE KIT: 1.0000 | PACK | Freq: Every day | Status: DC

## 2014-01-18 NOTE — Assessment & Plan Note (Addendum)
Given Prevnar today and Rx for Zoster vaccine, DM foot exam today  Mammogram no indicated after b/l masectomy she has f/u with H/O in near future Will need PCV 23 in 1 year.

## 2014-01-18 NOTE — Assessment & Plan Note (Signed)
H/a could be due to elevated BP today, no neurological sx's to indicate TIA/stroke  Offered CT head w/o contrast pt decline and will return in h/a continues  Prn Tylenol if needed  F/u if not resolved before next sch  appt

## 2014-01-18 NOTE — Assessment & Plan Note (Signed)
Resolved if recurrent UTI consider referral to urology in the future

## 2014-01-18 NOTE — Assessment & Plan Note (Signed)
BP Readings from Last 3 Encounters:  01/18/14 163/90  12/12/13 151/71  12/06/13 152/92    Lab Results  Component Value Date   NA 146 12/20/2013   K 4.4 12/20/2013   CREATININE 1.35* 12/20/2013    Assessment: Blood pressure control: mildly elevated Progress toward BP goal:  deteriorated Comments: diet noncompliant with salt   Plan: Medications:  continue current medications Lasix MWF and prn, Coreg increase to 12.5 mg bid, benicar 40 mg qd, Spironlactone 25 mg qd  Educational resources provided: brochure Self management tools provided: yes Other plans: check BMET today

## 2014-01-18 NOTE — Progress Notes (Signed)
Subjective:    Patient ID: [name], female    DOB: [birth year], 63 y.o.   MRN: [id number]  HPI Comments: 63 y.o PMH chronic pain, HLD (LDL 93 09/2013), HTN, DM 2, CKD 3  She presents for f/u  1. DM 2 controlled on Metformin 500 mg qam.  Cbgs ranging 59 (one time) to 70s to 90s to 140s in the am per pt log. cbg today 111, HA1C 5.9 today  2. HTN uncontrolled today 162/90 then 163/90. She reports for last 3 days prior to today she has been eating pork meat hog jowl though taking Coreg 6.25 mg bid, Lasix 40 mg MWF, Benicar 40, Spironlactone 25.  She has not been checking her BP at home b/c meter is not calibrated correctly  3. She reports h/a x 3 days in back of head.  She states last night she also saw flash of light in left eye.  Denies radiation of h/a, aura, weakness, not bothered by light/sound. H/a is intermittent 3-4/10. She has not tried any medication OTC.  She denies being dizzy.  Discussed letting eye MD know about flashes of light in eye and may want to see earlier than 05/2013.  Disc'ed head CT but pt declines today  4. Dysuria history-doing well no dysuria today though h/o E.coli recurrent and GBS.  If returns disc'ed consider sending to urology to figure out why recurrent.   5. H/o nonobstructing left kidney calculus and left kidney simple cyst with ?septation which ddx is infection, HTN, malignancy per up to date on 10/2013-will repeat US vs MRI in 6-12 months at f/u         Review of Systems  Respiratory: Negative for shortness of breath.   Cardiovascular: Positive for leg swelling. Negative for chest pain.       Chronic leg edema   Gastrointestinal: Negative for abdominal pain and constipation.  Genitourinary: Negative for dysuria.  Neurological: Positive for headaches. Negative for dizziness, weakness and light-headedness.       Objective:   Physical Exam  Nursing note and vitals reviewed. Constitutional: She is oriented to person, place, and time. She appears  well-developed and well-nourished. She is cooperative. No distress.  HENT:  Head: Normocephalic and atraumatic.  Mouth/Throat: Oropharynx is clear and moist and mucous membranes are normal. No oropharyngeal exudate.  Eyes: Conjunctivae are normal. Pupils are equal, round, and reactive to light. Right eye exhibits no discharge. Left eye exhibits no discharge. No scleral icterus.  Cardiovascular: Normal rate, regular rhythm, S1 normal, S2 normal and normal heart sounds.   No murmur heard. Pulses:      Dorsalis pedis pulses are 2+ on the right side, and 2+ on the left side.       Posterior tibial pulses are 2+ on the right side, and 2+ on the left side.  Chronic pedal edema nonpitting today   Pulmonary/Chest: Effort normal and breath sounds normal. No respiratory distress. She has no wheezes.  Abdominal: Soft. Bowel sounds are normal. There is no tenderness.  Neurological: She is alert and oriented to person, place, and time. She has normal strength. No cranial nerve deficit or sensory deficit. Gait normal.  CN 2-12 grossly intact, moving all 4 extremities   Skin: Skin is warm, dry and intact. No rash noted. She is not diaphoretic.  Psychiatric: She has a normal mood and affect. Her speech is normal and behavior is normal. Judgment and thought content normal. Cognition and memory are normal.  Assessment & Plan:  F/u in 3-6 months, sooner if perisistent h/a or elevated BP

## 2014-01-18 NOTE — Assessment & Plan Note (Signed)
Lab Results  Component Value Date   HGBA1C 5.9 01/18/2014   HGBA1C 7.1 08/16/2013   HGBA1C 6.4 08/22/2012     Assessment: Diabetes control: good control (HgbA1C at goal) Progress toward A1C goal:  at goal Comments: improved on Metformin 500 mg qd   Plan: Medications:  continue current medications unless told otherwise after checking BMET Home glucose monitoring: Frequency: once a day Timing: before breakfast Instruction/counseling given: reminded to get eye exam, reminded to bring blood glucose meter & log to each visit, discussed foot care and discussed diet Educational resources provided: brochure Self management tools provided: home glucose logbook Other plans: HA1C, foot exam today, will see eye MD 05/2013 but will call eye MD to see if can be seen sooner

## 2014-01-18 NOTE — Patient Instructions (Addendum)
General Instructions: Please take Coreg 12.5 mg 2x per day  Check your blood pressure daily once you get a meter  You can take Tylenol for headache as needed  Please follow up in 3-6 months, sooner if needed for headache  Please get Shingles vaccines    Treatment Goals:  Goals (1 Years of Data) as of 01/18/14         As of Today 12/12/13 12/06/13 12/04/13 11/29/13     Blood Pressure    . Blood Pressure < 140/90  162/90 151/71 152/92 134/78 155/80      Progress Toward Treatment Goals:  Treatment Goal 01/18/2014  Hemoglobin A1C at goal  Blood pressure deteriorated    Self Care Goals & Plans:  Self Care Goal 01/18/2014  Manage my medications take my medicines as prescribed; bring my medications to every visit; refill my medications on time; follow the sick day instructions if I am sick  Monitor my health keep track of my blood glucose; bring my glucose meter and log to each visit; keep track of my blood pressure; bring my blood pressure log to each visit; keep track of my weight; check my feet daily  Eat healthy foods drink diet soda or water instead of juice or soda; eat more vegetables; eat foods that are low in salt; eat baked foods instead of fried foods; eat fruit for snacks and desserts; eat smaller portions  Be physically active find an activity I enjoy  Meeting treatment goals maintain the current self-care plan    Home Blood Glucose Monitoring 01/18/2014  Check my blood sugar once a day  When to check my blood sugar before breakfast     Care Management & Community Referrals:  Referral 01/18/2014  Referrals made for care management support none needed  Referrals made to community resources none     Hypertension Hypertension, commonly called high blood pressure, is when the force of blood pumping through your arteries is too strong. Your arteries are the blood vessels that carry blood from your heart throughout your body. A blood pressure reading consists of a higher  number over a lower number, such as 110/72. The higher number (systolic) is the pressure inside your arteries when your heart pumps. The lower number (diastolic) is the pressure inside your arteries when your heart relaxes. Ideally you want your blood pressure below 120/80. Hypertension forces your heart to work harder to pump blood. Your arteries may become narrow or stiff. Having hypertension puts you at risk for heart disease, stroke, and other problems.  RISK FACTORS Some risk factors for high blood pressure are controllable. Others are not.  Risk factors you cannot control include:   Race. You may be at higher risk if you are African American.  Age. Risk increases with age.  Gender. Men are at higher risk than women before age 86 years. After age 31, women are at higher risk than men. Risk factors you can control include:  Not getting enough exercise or physical activity.  Being overweight.  Getting too much fat, sugar, calories, or salt in your diet.  Drinking too much alcohol. SIGNS AND SYMPTOMS Hypertension does not usually cause signs or symptoms. Extremely high blood pressure (hypertensive crisis) may cause headache, anxiety, shortness of breath, and nosebleed. DIAGNOSIS  To check if you have hypertension, your health care provider will measure your blood pressure while you are seated, with your arm held at the level of your heart. It should be measured at least twice using the  same arm. Certain conditions can cause a difference in blood pressure between your right and left arms. A blood pressure reading that is higher than normal on one occasion does not mean that you need treatment. If one blood pressure reading is high, ask your health care provider about having it checked again. TREATMENT  Treating high blood pressure includes making lifestyle changes and possibly taking medicine. Living a healthy lifestyle can help lower high blood pressure. You may need to change some of your  habits. Lifestyle changes may include:  Following the DASH diet. This diet is high in fruits, vegetables, and whole grains. It is low in salt, red meat, and added sugars.  Getting at least 2 hours of brisk physical activity every week.  Losing weight if necessary.  Not smoking.  Limiting alcoholic beverages.  Learning ways to reduce stress. If lifestyle changes are not enough to get your blood pressure under control, your health care provider may prescribe medicine. You may need to take more than one. Work closely with your health care provider to understand the risks and benefits. HOME CARE INSTRUCTIONS  Have your blood pressure rechecked as directed by your health care provider.   Take medicines only as directed by your health care provider. Follow the directions carefully. Blood pressure medicines must be taken as prescribed. The medicine does not work as well when you skip doses. Skipping doses also puts you at risk for problems.   Do not smoke.   Monitor your blood pressure at home as directed by your health care provider. SEEK MEDICAL CARE IF:   You think you are having a reaction to medicines taken.  You have recurrent headaches or feel dizzy.  You have swelling in your ankles.  You have trouble with your vision. SEEK IMMEDIATE MEDICAL CARE IF:  You develop a severe headache or confusion.  You have unusual weakness, numbness, or feel faint.  You have severe chest or abdominal pain.  You vomit repeatedly.  You have trouble breathing. MAKE SURE YOU:   Understand these instructions.  Will watch your condition.  Will get help right away if you are not doing well or get worse. Document Released: 03/09/2005 Document Revised: 07/24/2013 Document Reviewed: 12/30/2012 [facility] Patient Information 2015 [place], [place]. This information is not intended to replace advice given to you by your health care provider. Make sure you discuss any questions you have with  your health care provider.  DASH Eating Plan DASH stands for "Dietary Approaches to Stop Hypertension." The DASH eating plan is a healthy eating plan that has been shown to reduce high blood pressure (hypertension). Additional health benefits may include reducing the risk of type 2 diabetes mellitus, heart disease, and stroke. The DASH eating plan may also help with weight loss. WHAT DO I NEED TO KNOW ABOUT THE DASH EATING PLAN? For the DASH eating plan, you will follow these general guidelines:  Choose foods with a percent daily value for sodium of less than 5% (as listed on the food label).  Use salt-free seasonings or herbs instead of table salt or sea salt.  Check with your health care provider or pharmacist before using salt substitutes.  Eat lower-sodium products, often labeled as "lower sodium" or "no salt added."  Eat fresh foods.  Eat more vegetables, fruits, and low-fat dairy products.  Choose whole grains. Look for the word "whole" as the first word in the ingredient list.  Choose fish and skinless chicken or [place] more often than red meat. Limit  fish, poultry, and meat to 6 oz (170 g) each day.  Limit sweets, desserts, sugars, and sugary drinks.  Choose heart-healthy fats.  Limit cheese to 1 oz (28 g) per day.  Eat more home-cooked food and less restaurant, buffet, and fast food.  Limit fried foods.  Cook foods using methods other than frying.  Limit canned vegetables. If you do use them, rinse them well to decrease the sodium.  When eating at a restaurant, ask that your food be prepared with less salt, or no salt if possible. WHAT FOODS CAN I EAT? Seek help from a dietitian for individual calorie needs. Grains Whole grain or whole wheat bread. Brown rice. Whole grain or whole wheat pasta. Quinoa, bulgur, and whole grain cereals. Low-sodium cereals. Corn or whole wheat flour tortillas. Whole grain cornbread. Whole grain crackers. Low-sodium  crackers. Vegetables Fresh or frozen vegetables (raw, steamed, roasted, or grilled). Low-sodium or reduced-sodium tomato and vegetable juices. Low-sodium or reduced-sodium tomato sauce and paste. Low-sodium or reduced-sodium canned vegetables.  Fruits All fresh, canned (in natural juice), or frozen fruits. Meat and Other Protein Products Ground beef (85% or leaner), grass-fed beef, or beef trimmed of fat. Skinless chicken or [place]. Ground chicken or [place]. Pork trimmed of fat. All fish and seafood. Eggs. Dried beans, peas, or lentils. Unsalted nuts and seeds. Unsalted canned beans. Dairy Low-fat dairy products, such as skim or 1% milk, 2% or reduced-fat cheeses, low-fat ricotta or cottage cheese, or plain low-fat yogurt. Low-sodium or reduced-sodium cheeses. Fats and Oils Tub margarines without trans fats. Light or reduced-fat mayonnaise and salad dressings (reduced sodium). Avocado. Safflower, olive, or canola oils. Natural peanut or almond butter. Other Unsalted popcorn and pretzels. The items listed above may not be a complete list of recommended foods or beverages. Contact your dietitian for more options. WHAT FOODS ARE NOT RECOMMENDED? Grains White bread. White pasta. White rice. Refined cornbread. Bagels and croissants. Crackers that contain trans fat. Vegetables Creamed or fried vegetables. Vegetables in a cheese sauce. Regular canned vegetables. Regular canned tomato sauce and paste. Regular tomato and vegetable juices. Fruits Dried fruits. Canned fruit in light or heavy syrup. Fruit juice. Meat and Other Protein Products Fatty cuts of meat. Ribs, chicken wings, bacon, sausage, bologna, salami, chitterlings, fatback, hot dogs, bratwurst, and packaged luncheon meats. Salted nuts and seeds. Canned beans with salt. Dairy Whole or 2% milk, cream, half-and-half, and cream cheese. Whole-fat or sweetened yogurt. Full-fat cheeses or blue cheese. Nondairy creamers and whipped toppings.  Processed cheese, cheese spreads, or cheese curds. Condiments Onion and garlic salt, seasoned salt, table salt, and sea salt. Canned and packaged gravies. Worcestershire sauce. Tartar sauce. Barbecue sauce. Teriyaki sauce. Soy sauce, including reduced sodium. Steak sauce. Fish sauce. Oyster sauce. Cocktail sauce. Horseradish. Ketchup and mustard. Meat flavorings and tenderizers. Bouillon cubes. Hot sauce. Tabasco sauce. Marinades. Taco seasonings. Relishes. Fats and Oils Butter, stick margarine, lard, shortening, ghee, and bacon fat. Coconut, palm kernel, or palm oils. Regular salad dressings. Other Pickles and olives. Salted popcorn and pretzels. The items listed above may not be a complete list of foods and beverages to avoid. Contact your dietitian for more information. WHERE CAN I FIND MORE INFORMATION? [employer], [employer], [employer]: travelstabloid.com Document Released: 02/26/2011 Document Revised: 07/24/2013 Document Reviewed: 01/11/2013 [facility] Patient Information 2015 [place], [place]. This information is not intended to replace advice given to you by your health care provider. Make sure you discuss any questions you have with your health care provider.

## 2014-01-19 ENCOUNTER — Other Ambulatory Visit: Payer: Self-pay | Admitting: Nurse Practitioner

## 2014-01-19 ENCOUNTER — Other Ambulatory Visit: Payer: Self-pay | Admitting: Internal Medicine

## 2014-01-19 DIAGNOSIS — I10 Essential (primary) hypertension: Secondary | ICD-10-CM

## 2014-01-19 MED ORDER — CARVEDILOL 6.25 MG PO TABS: 12.5000 mg | ORAL_TABLET | Freq: Two times a day (BID) | ORAL | Status: DC

## 2014-01-22 ENCOUNTER — Telehealth: Payer: Self-pay | Admitting: Internal Medicine

## 2014-01-22 ENCOUNTER — Other Ambulatory Visit: Payer: Self-pay | Admitting: Internal Medicine

## 2014-01-22 DIAGNOSIS — I10 Essential (primary) hypertension: Secondary | ICD-10-CM

## 2014-01-22 MED ORDER — CARVEDILOL 12.5 MG PO TABS: 12.5000 mg | ORAL_TABLET | Freq: Two times a day (BID) | ORAL | Status: DC

## 2014-01-22 NOTE — Telephone Encounter (Signed)
BP is taking Coreg 6.25 (2 pills) bid.  Pt denies h/a since last visit.  She needs Rx refills of Coreg will change to 12.5 bid so pt does not have to take 2 pills.   Aundra Dubin MD

## 2014-01-24 NOTE — Progress Notes (Signed)
Case discussed with Dr. McLean soon after the resident saw the patient.  We reviewed the resident's history and exam and pertinent patient test results.  I agree with the assessment, diagnosis and plan of care documented in the resident's note. 

## 2014-01-25 ENCOUNTER — Ambulatory Visit (HOSPITAL_BASED_OUTPATIENT_CLINIC_OR_DEPARTMENT_OTHER): Payer: Medicare Other | Admitting: Adult Health

## 2014-01-25 ENCOUNTER — Encounter: Payer: Self-pay | Admitting: Adult Health

## 2014-01-25 ENCOUNTER — Telehealth: Payer: Self-pay | Admitting: Adult Health

## 2014-01-25 ENCOUNTER — Other Ambulatory Visit (HOSPITAL_BASED_OUTPATIENT_CLINIC_OR_DEPARTMENT_OTHER): Payer: Medicare Other

## 2014-01-25 VITALS — BP 140/73 | HR 71 | Temp 98.2°F | Resp 18 | Ht 68.0 in | Wt 222.2 lb

## 2014-01-25 DIAGNOSIS — C50919 Malignant neoplasm of unspecified site of unspecified female breast: Secondary | ICD-10-CM

## 2014-01-25 DIAGNOSIS — C50912 Malignant neoplasm of unspecified site of left female breast: Secondary | ICD-10-CM

## 2014-01-25 DIAGNOSIS — Z170 Estrogen receptor positive status [ER+]: Secondary | ICD-10-CM

## 2014-01-25 DIAGNOSIS — R0789 Other chest pain: Secondary | ICD-10-CM

## 2014-01-25 LAB — CBC WITH DIFFERENTIAL/PLATELET
BASO%: 1.4 % (ref 0.0–2.0)
Basophils Absolute: 0.1 10e3/uL (ref 0.0–0.1)
EOS%: 4.4 % (ref 0.0–7.0)
Eosinophils Absolute: 0.3 10e3/uL (ref 0.0–0.5)
HCT: 32.3 % — ABNORMAL LOW (ref 34.8–46.6)
HGB: 10.6 g/dL — ABNORMAL LOW (ref 11.6–15.9)
LYMPH%: 41.7 % (ref 14.0–49.7)
MCH: 28.5 pg (ref 25.1–34.0)
MCHC: 32.8 g/dL (ref 31.5–36.0)
MCV: 87 fL (ref 79.5–101.0)
MONO#: 0.4 10e3/uL (ref 0.1–0.9)
MONO%: 6.5 % (ref 0.0–14.0)
NEUT#: 2.8 10e3/uL (ref 1.5–6.5)
NEUT%: 46 % (ref 38.4–76.8)
Platelets: 234 10e3/uL (ref 145–400)
RBC: 3.71 10e6/uL (ref 3.70–5.45)
RDW: 13.7 % (ref 11.2–14.5)
WBC: 6.2 10e3/uL (ref 3.9–10.3)
lymph#: 2.6 10e3/uL (ref 0.9–3.3)

## 2014-01-25 LAB — COMPREHENSIVE METABOLIC PANEL (CC13)
ALT: 22 U/L (ref 0–55)
AST: 17 U/L (ref 5–34)
Albumin: 4.4 g/dL (ref 3.5–5.0)
Alkaline Phosphatase: 66 U/L (ref 40–150)
Anion Gap: 9 mEq/L (ref 3–11)
BUN: 27 mg/dL — ABNORMAL HIGH (ref 7.0–26.0)
CO2: 26 mEq/L (ref 22–29)
Calcium: 10.1 mg/dL (ref 8.4–10.4)
Chloride: 108 mEq/L (ref 98–109)
Creatinine: 1.5 mg/dL — ABNORMAL HIGH (ref 0.6–1.1)
Glucose: 103 mg/dl (ref 70–140)
Potassium: 4.6 mEq/L (ref 3.5–5.1)
Sodium: 143 mEq/L (ref 136–145)
Total Bilirubin: 0.58 mg/dL (ref 0.20–1.20)
Total Protein: 7.6 g/dL (ref 6.4–8.3)

## 2014-01-25 MED ORDER — LETROZOLE 2.5 MG PO TABS: ORAL_TABLET | ORAL | Status: DC

## 2014-01-25 NOTE — Patient Instructions (Signed)
You are doing well.  You have no sign of recurrence.  Continue taking Letrozole daily.  I recommend healthy diet, exercise, and monthly breast exams.    \reast Self-Awareness Practicing breast self-awareness may pick up problems early, prevent significant medical complications, and possibly save your life. By practicing breast self-awareness, you can become familiar with how your breasts look and feel and if your breasts are changing. This allows you to notice changes early. It can also offer you some reassurance that your breast health is good. One way to learn what is normal for your breasts and whether your breasts are changing is to do a breast self-exam. If you find a lump or something that was not present in the past, it is best to contact your caregiver right away. Other findings that should be evaluated by your caregiver include nipple discharge, especially if it is bloody; skin changes or reddening; areas where the skin seems to be pulled in (retracted); or new lumps and bumps. Breast pain is seldom associated with cancer (malignancy), but should also be evaluated by a caregiver. HOW TO PERFORM A BREAST SELF-EXAM The best time to examine your breasts is 5-7 days after your menstrual period is over. During menstruation, the breasts are lumpier, and it may be more difficult to pick up changes. If you do not menstruate, have reached menopause, or had your uterus removed (hysterectomy), you should examine your breasts at regular intervals, such as monthly. If you are breastfeeding, examine your breasts after a feeding or after using a breast pump. Breast implants do not decrease the risk for lumps or tumors, so continue to perform breast self-exams as recommended. Talk to your caregiver about how to determine the difference between the implant and breast tissue. Also, talk about the amount of pressure you should use during the exam. Over time, you will become more familiar with the variations of your  breasts and more comfortable with the exam. A breast self-exam requires you to remove all your clothes above the waist.  Look at your breasts and nipples. Stand in front of a mirror in a room with good lighting. With your hands on your hips, push your hands firmly downward. Look for a difference in shape, contour, and size from one breast to the other (asymmetry). Asymmetry includes puckers, dips, or bumps. Also, look for skin changes, such as reddened or scaly areas on the breasts. Look for nipple changes, such as discharge, dimpling, repositioning, or redness.  Carefully feel your breasts. This is best done either in the shower or tub while using soapy water or when flat on your back. Place the arm (on the side of the breast you are examining) above your head. Use the pads (not the fingertips) of your three middle fingers on your opposite hand to feel your breasts. Start in the underarm area and use  inch (2 cm) overlapping circles to feel your breast. Use 3 different levels of pressure (light, medium, and firm pressure) at each circle before moving to the next circle. The light pressure is needed to feel the tissue closest to the skin. The medium pressure will help to feel breast tissue a little deeper, while the firm pressure is needed to feel the tissue close to the ribs. Continue the overlapping circles, moving downward over the breast until you feel your ribs below your breast. Then, move one finger-width towards the center of the body. Continue to use the  inch (2 cm) overlapping circles to feel your breast as  you move slowly up toward the collar bone (clavicle) near the base of the neck. Continue the up and down exam using all 3 pressures until you reach the middle of the chest. Do this with each breast, carefully feeling for lumps or changes.   Keep a written record with breast changes or normal findings for each breast. By writing this information down, you do not need to depend only on memory  for size, tenderness, or location. Write down where you are in your menstrual cycle, if you are still menstruating. Breast tissue can have some lumps or thick tissue. However, see your caregiver if you find anything that concerns you.  SEEK MEDICAL CARE IF:  You see a change in shape, contour, or size of your breasts or nipples.   You see skin changes, such as reddened or scaly areas on the breasts or nipples.   You have an unusual discharge from your nipples.   You feel a new lump or unusually thick areas.  Document Released: 03/09/2005 Document Revised: 02/24/2012 Document Reviewed: 06/24/2011 [facility] Patient Information 2015 [place], [place]. This information is not intended to replace advice given to you by your health care provider. Make sure you discuss any questions you have with your health care provider.

## 2014-01-25 NOTE — Telephone Encounter (Signed)
, °

## 2014-01-25 NOTE — Progress Notes (Signed)
OFFICE PROGRESS NOTE  CC [name], MD [name], MD  Cresenciano Genre, MD [place] [id number]  DIAGNOSIS: 63 year old female with  #1 stage II A. ER/PR positive HER-2/neu positive invasive ductal carcinoma of the left breast diagnosed October 2011. Patient is status post bilateral mastectomies.  #2 status post right CVA.  #3 right internal jugular deep venous thrombosis secondary to her Port-A-Cath  #4 status post PICC line placement for ongoing Herceptin therapy.  PRIOR THERAPY:  #1 Status post bilateral mastectomy performed in October 2011  #2 The left breast: final pathology revealed infiltrating ductal carcinoma that was ER positive PR positive HER-2/neu positive. Right breast specimen only revealed benign breast tissue.  #3 patient received adjuvant chemotherapy consisting of weekly Taxotere carboplatinum and Herceptin for a total of 6 cycle.  #4 Patient has completed Herceptin on 04/17/2011.  #5 she has been on letrozole 2.5 mg starting June 2012.  CURRENT THERAPY: Letrozole 2.5 mg daily since June 2012  INTERVAL HISTORY: CHRISTANA ANGELICA 63 y.o. female returns today follow up visit. She is taking Letrozole daily.  She is doing well with this.  She does have increased tearing in her eyes and this is managed with clear eyes that is helping.  She does have hot flashes that she is used to and is tolerating.  She says that her left breast feels different in the afternoons near her surgical site.  She doesn't have any joint aches, or any further concerns. We updated her health maintenance below.   MEDICAL HISTORY: Past Medical History  Diagnosis Date  . Depression     seasonal melancholia  . Hyperlipidemia   . Hypertension   . Myopathy     not statin related, cause unknown   . Back pain   . Edema extremities   . Chemotherapy follow-up examination 03/06/2011  . Cough 03/06/2011  . Arthritis     DJD, lumbar spondylosis  . Bronchitis  03/10/2012  . Asthma   . GERD (gastroesophageal reflux disease)   . Cataract   . Breast cancer     left breast invasive ductal ca in remission as of 10/07/11 s/p double mastectomy  . E. coli UTI   . Ileitis   . Kidney stones     non obstructive   . Bilateral renal cysts   . Colon polyps     ALLERGIES:  is allergic to atorvastatin; hctz; norvasc; and rosuvastatin.  MEDICATIONS:  Current Outpatient Prescriptions  Medication Sig Dispense Refill  . albuterol (PROVENTIL HFA;VENTOLIN HFA) 108 (90 BASE) MCG/ACT inhaler Inhale 1-2 puffs into the lungs every 6 (six) hours as needed for wheezing or shortness of breath. 1 Inhaler 2  . aspirin 81 MG tablet Take 81 mg by mouth daily.    . Blood Pressure KIT 1 Device by Does not apply route daily. 1 each 0  . carvedilol (COREG) 12.5 MG tablet Take 1 tablet (12.5 mg total) by mouth 2 (two) times daily with a meal. 180 tablet 0  . carvedilol (COREG) 6.25 MG tablet   3  . cholecalciferol (VITAMIN D) 1000 UNITS tablet Take 1,000 Units by mouth daily.    . ferrous sulfate 325 (65 FE) MG tablet Take 1 tablet (325 mg total) by mouth 3 (three) times daily with meals. 90 tablet 6  . Fluticasone-Salmeterol (ADVAIR) 100-50 MCG/DOSE AEPB Inhale 1 puff into the lungs 2 (two) times daily as needed (wheezing). 60 each 3  . furosemide (LASIX) 40 MG tablet Take  40 mg every Mon, Wed and Fri 30 tablet 2  . letrozole (FEMARA) 2.5 MG tablet TAKE 1 TABLET (2.5 MG TOTAL) BY MOUTH DAILY. 90 tablet 3  . LORazepam (ATIVAN) 1 MG tablet TAKE 1 TABLET BY MOUTH DAILY AS NEEDED FOR ANXIETY 30 tablet 1  . metFORMIN (GLUCOPHAGE) 500 MG tablet Take 1 tablet (500 mg total) by mouth daily with breakfast. 90 tablet 0  . Multiple Vitamin (MULTIVITAMIN) tablet Take 1 tablet by mouth daily.     Marland Kitchen olmesartan (BENICAR) 40 MG tablet Take 1 tablet (40 mg total) by mouth daily. 30 tablet 5  . pantoprazole (PROTONIX) 40 MG tablet TAKE 1 TABLET (40 MG TOTAL) BY MOUTH 2 (TWO) TIMES DAILY. 60  tablet 3  . potassium chloride SA (K-DUR,KLOR-CON) 10 MEQ tablet Take 1 tablet (10 mEq total) by mouth daily. When you take Lasix 30 tablet 3  . Simethicone 125 MG CAPS Take 1 capsule (125 mg total) by mouth 4 (four) times daily as needed (gas, bloating). 90 each 2  . simvastatin (ZOCOR) 20 MG tablet TAKE 1 TABLET (20 MG TOTAL) BY MOUTH AT BEDTIME. 90 tablet 1  . spironolactone (ALDACTONE) 25 MG tablet Take 1 tablet (25 mg total) by mouth daily. 30 tablet 5  . sulfamethoxazole-trimethoprim (BACTRIM DS) 800-160 MG per tablet   0  . traMADol (ULTRAM) 50 MG tablet Take 1 tablet (50 mg total) by mouth every 6 (six) hours as needed. For pain 90 tablet 3  . triamcinolone cream (KENALOG) 0.1 % Apply topically 2 (two) times daily as needed. 454 g 0   No current facility-administered medications for this visit.   Facility-Administered Medications Ordered in Other Visits  Medication Dose Route Frequency Provider Last Rate Last Dose  . sodium chloride 0.9 % injection 10 mL  10 mL Intravenous PRN Victorino December, MD   10 mL at 01/26/11 1035    SURGICAL HISTORY:  Past Surgical History  Procedure Laterality Date  . Abdominal hysterectomy      for fibroids   . Laminectomy      C5-6 Dr. Otelia Sergeant 2005   . Oophorectomy    . Mastectomy      bilateral   . Tonsillectomy    . Appendectomy    . Incontinence surgery    . Breast surgery  2011    Bilateral mastectomy    REVIEW OF SYSTEMS:  A 10 point review of systems was conducted and is otherwise negative except for what is noted above.     Health Maintenance  Mammogram: n/a Colonoscopy: 05/2012 Bone Density Scan: 07/2013-normal Pap Smear: about 1 year ago Eye Exam: 05/2012 Vitamin D Level: 01/20/2013 Lipid Panel:  08/2012    PHYSICAL EXAMINATION:  BP 140/73 mmHg  Pulse 71  Temp(Src) 98.2 F (36.8 C) (Oral)  Resp 18  Ht 5\' 8"  (1.727 m)  Wt 222 lb 3.2 oz (100.789 kg)  BMI 33.79 kg/m2  LMP 12/31/1968 GENERAL: Patient is a well appearing  female in no acute distress HEENT:  Sclerae anicteric.  Oropharynx clear and moist. No ulcerations or evidence of oropharyngeal candidiasis. Neck is supple.  NODES:  No cervical, supraclavicular, or axillary lymphadenopathy palpated.  BREAST EXAM:  S/p bilateral mastectomy.  Tenderness when palpating the left upper chest wall muscle, no nodule mass or skin change bilaterally.   LUNGS:  Clear to auscultation bilaterally.  No wheezes or rhonchi. HEART:  Regular rate and rhythm. No murmur appreciated. ABDOMEN:  Soft, nontender.  Positive, normoactive bowel sounds.  No organomegaly palpated. MSK:  No focal spinal tenderness to palpation. Full range of motion bilaterally in the upper extremities. EXTREMITIES:  No peripheral edema.   SKIN:  Clear with no obvious rashes or skin changes. No nail dyscrasia. NEURO:  Nonfocal. Well oriented.  Appropriate affect. ECOG PERFORMANCE STATUS: 0 - Asymptomatic  LABORATORY DATA: Lab Results  Component Value Date   WBC 6.2 01/25/2014   HGB 10.6* 01/25/2014   HCT 32.3* 01/25/2014   MCV 87.0 01/25/2014   PLT 234 01/25/2014      Chemistry      Component Value Date/Time   NA 143 01/25/2014 1019   NA 143 01/18/2014 1452   K 4.6 01/25/2014 1019   K 4.0 01/18/2014 1452   CL 105 01/18/2014 1452   CL 103 04/25/2012 1045   CO2 26 01/25/2014 1019   CO2 27 01/18/2014 1452   BUN 27.0* 01/25/2014 1019   BUN 14 01/18/2014 1452   CREATININE 1.5* 01/25/2014 1019   CREATININE 1.31* 01/18/2014 1452   CREATININE 1.35* 12/20/2013 1131      Component Value Date/Time   CALCIUM 10.1 01/25/2014 1019   CALCIUM 10.0 01/18/2014 1452   ALKPHOS 66 01/25/2014 1019   ALKPHOS 56 10/12/2013 1204   AST 17 01/25/2014 1019   AST 21 10/12/2013 1204   ALT 22 01/25/2014 1019   ALT 23 10/12/2013 1204   BILITOT 0.58 01/25/2014 1019   BILITOT 0.7 10/12/2013 1204       RADIOGRAPHIC STUDIES:  No results found.  ASSESSMENT: 63 year old female with:  1.  stage II a  invasive ductal carcinoma of the left breast diagnosed in October 2011. She was found to have a ER/PR positive HER-2/neu positive breast cancer.She underwent bilateral mastectomies. Her right mastectomy was an elective prophylactic mastectomy due to her on comfort. Patient subsequently underwent adjuvant chemotherapy consisting of Taxotere carboplatinum and Herceptin for a total of 6 cycles. Thereafter she received Herceptin every 3 weeks and she has been on Letrozole 2.5 mg daily.    PLAN:   Merie is doing well today.  She has no sign of recurrence.  Her lab work remains stable and I reviewed it with her in detail.  We did update her health maintenance.  She will continue taking letrozole daily.  I recommended healthy diet, exercise, and monthly breast exams.    Her left upper chest wall pain is likely a muscle strain.  Her daughter does report that she lifts and pulls more than necessary.  That muscle does have reproducible tenderness.  I recommended she refrain from heavy lifting/work.  She agreed to refrain.    Mauricia will return in 6 months for labs and follow up.    All questions were answered. The patient knows to call the clinic with any problems, questions or concerns. We can certainly see the patient much sooner if necessary.  I spent 25 minutes counseling the patient face to face. The total time spent in the appointment was 30 minutes.  Minette Headland, [place] [phone] 01/25/2014, 11:33 AM

## 2014-01-29 ENCOUNTER — Telehealth: Payer: Self-pay | Admitting: *Deleted

## 2014-01-29 NOTE — Telephone Encounter (Signed)
Call to patient spoke with her daughter this am.  Message per Dr. Aundra Dubin that kidney numbers were ok and to continue Metformin in the am.  Dayghter-Tracey voiced understanding of the plan and message.  Sander Nephew, RN 01/29/2014 8:46 AM.

## 2014-02-19 ENCOUNTER — Other Ambulatory Visit: Payer: Self-pay | Admitting: Internal Medicine

## 2014-02-23 ENCOUNTER — Other Ambulatory Visit: Payer: Self-pay | Admitting: Pharmacist

## 2014-02-23 DIAGNOSIS — E119 Type 2 diabetes mellitus without complications: Secondary | ICD-10-CM

## 2014-02-23 MED ORDER — METFORMIN HCL 500 MG PO TABS: 500.0000 mg | ORAL_TABLET | Freq: Every day | ORAL | Status: DC

## 2014-02-27 ENCOUNTER — Encounter: Payer: Self-pay | Admitting: Internal Medicine

## 2014-03-10 ENCOUNTER — Other Ambulatory Visit: Payer: Self-pay | Admitting: Internal Medicine

## 2014-03-28 ENCOUNTER — Other Ambulatory Visit (HOSPITAL_COMMUNITY): Payer: Self-pay | Admitting: Cardiology

## 2014-03-28 DIAGNOSIS — I10 Essential (primary) hypertension: Secondary | ICD-10-CM

## 2014-03-28 MED ORDER — CARVEDILOL 12.5 MG PO TABS: 12.5000 mg | ORAL_TABLET | Freq: Two times a day (BID) | ORAL | Status: DC

## 2014-03-29 ENCOUNTER — Other Ambulatory Visit: Payer: Self-pay | Admitting: *Deleted

## 2014-03-29 DIAGNOSIS — C50912 Malignant neoplasm of unspecified site of left female breast: Secondary | ICD-10-CM

## 2014-03-29 MED ORDER — LETROZOLE 2.5 MG PO TABS: ORAL_TABLET | ORAL | Status: DC

## 2014-03-29 MED ORDER — VENLAFAXINE HCL 37.5 MG PO TABS: 37.5000 mg | ORAL_TABLET | Freq: Every day | ORAL | Status: DC

## 2014-03-30 ENCOUNTER — Other Ambulatory Visit (HOSPITAL_COMMUNITY): Payer: Self-pay

## 2014-03-30 DIAGNOSIS — H43811 Vitreous degeneration, right eye: Secondary | ICD-10-CM | POA: Diagnosis not present

## 2014-03-30 DIAGNOSIS — E119 Type 2 diabetes mellitus without complications: Secondary | ICD-10-CM | POA: Diagnosis not present

## 2014-03-30 DIAGNOSIS — I10 Essential (primary) hypertension: Secondary | ICD-10-CM

## 2014-03-30 DIAGNOSIS — H524 Presbyopia: Secondary | ICD-10-CM | POA: Diagnosis not present

## 2014-03-30 LAB — HM DIABETES EYE EXAM

## 2014-03-30 MED ORDER — CARVEDILOL 12.5 MG PO TABS: 12.5000 mg | ORAL_TABLET | Freq: Two times a day (BID) | ORAL | Status: DC

## 2014-04-03 ENCOUNTER — Other Ambulatory Visit: Payer: Self-pay | Admitting: *Deleted

## 2014-04-03 ENCOUNTER — Encounter: Payer: Self-pay | Admitting: *Deleted

## 2014-04-03 MED ORDER — ALBUTEROL SULFATE HFA 108 (90 BASE) MCG/ACT IN AERS: 1.0000 | INHALATION_SPRAY | Freq: Four times a day (QID) | RESPIRATORY_TRACT | Status: DC | PRN

## 2014-04-03 MED ORDER — FLUTICASONE-SALMETEROL 100-50 MCG/DOSE IN AEPB: 1.0000 | INHALATION_SPRAY | Freq: Two times a day (BID) | RESPIRATORY_TRACT | Status: DC | PRN

## 2014-04-03 NOTE — Telephone Encounter (Signed)
Refilled Advair and Albuterol.  Nothing further needed.

## 2014-04-04 ENCOUNTER — Other Ambulatory Visit: Payer: Self-pay | Admitting: Internal Medicine

## 2014-04-10 ENCOUNTER — Other Ambulatory Visit: Payer: Self-pay | Admitting: *Deleted

## 2014-04-10 NOTE — Telephone Encounter (Signed)
Received "Physician New Rx fax form" from [facility] to fill letrozole and venlafaxine tablets. Orders signed by Dr. Lindi Adie and faxed back to [phone].

## 2014-04-12 ENCOUNTER — Other Ambulatory Visit: Payer: Self-pay | Admitting: *Deleted

## 2014-04-12 DIAGNOSIS — E785 Hyperlipidemia, unspecified: Secondary | ICD-10-CM

## 2014-04-12 DIAGNOSIS — E119 Type 2 diabetes mellitus without complications: Secondary | ICD-10-CM

## 2014-04-15 MED ORDER — TRAMADOL HCL 50 MG PO TABS: 50.0000 mg | ORAL_TABLET | Freq: Four times a day (QID) | ORAL | Status: DC | PRN

## 2014-04-15 MED ORDER — LORAZEPAM 1 MG PO TABS: 1.0000 mg | ORAL_TABLET | Freq: Every day | ORAL | Status: DC | PRN

## 2014-04-15 MED ORDER — FLUTICASONE-SALMETEROL 100-50 MCG/DOSE IN AEPB: 1.0000 | INHALATION_SPRAY | Freq: Two times a day (BID) | RESPIRATORY_TRACT | Status: DC | PRN

## 2014-04-15 MED ORDER — SIMVASTATIN 20 MG PO TABS: ORAL_TABLET | ORAL | Status: DC

## 2014-04-15 MED ORDER — OLMESARTAN MEDOXOMIL 40 MG PO TABS: 40.0000 mg | ORAL_TABLET | Freq: Every day | ORAL | Status: DC

## 2014-04-15 MED ORDER — FUROSEMIDE 40 MG PO TABS: 40.0000 mg | ORAL_TABLET | ORAL | Status: DC

## 2014-04-15 MED ORDER — METFORMIN HCL 500 MG PO TABS: 500.0000 mg | ORAL_TABLET | Freq: Every day | ORAL | Status: DC

## 2014-04-15 MED ORDER — PANTOPRAZOLE SODIUM 40 MG PO TBEC: 40.0000 mg | DELAYED_RELEASE_TABLET | Freq: Every day | ORAL | Status: DC

## 2014-04-15 MED ORDER — SPIRONOLACTONE 25 MG PO TABS: 25.0000 mg | ORAL_TABLET | Freq: Every day | ORAL | Status: DC

## 2014-04-16 NOTE — Telephone Encounter (Signed)
Rxs for tramadol and lorazepam phoned into [facility]. Per pharmacy, they can only allow one refill on the lorazepam.Kaelynne Christley Cassady1/25/20164:53 PM

## 2014-04-23 ENCOUNTER — Telehealth: Payer: Self-pay | Admitting: *Deleted

## 2014-04-23 NOTE — Telephone Encounter (Signed)
Pt called - having problems with swollen feet over the weekend. Only taking generic Lasix as outlined. Pt has appt with Dr Aundra Dubin 04/26/13 - will talk about directions on medicine. If any changes with feet - call clinic. Hilda Blades Saadiq Poche RN 04/23/14 1:30PM

## 2014-04-25 ENCOUNTER — Telehealth: Payer: Self-pay | Admitting: Internal Medicine

## 2014-04-25 NOTE — Telephone Encounter (Signed)
Call to patient to confirm appointment for 04/26/14 at 1:45. LMTCB

## 2014-04-26 ENCOUNTER — Ambulatory Visit (INDEPENDENT_AMBULATORY_CARE_PROVIDER_SITE_OTHER): Payer: Commercial Managed Care - HMO | Admitting: Internal Medicine

## 2014-04-26 ENCOUNTER — Encounter: Payer: Self-pay | Admitting: Internal Medicine

## 2014-04-26 VITALS — BP 130/77 | HR 77 | Temp 98.3°F | Ht 68.0 in | Wt 224.3 lb

## 2014-04-26 DIAGNOSIS — I872 Venous insufficiency (chronic) (peripheral): Secondary | ICD-10-CM | POA: Diagnosis not present

## 2014-04-26 DIAGNOSIS — Z0000 Encounter for general adult medical examination without abnormal findings: Secondary | ICD-10-CM

## 2014-04-26 DIAGNOSIS — Z87440 Personal history of urinary (tract) infections: Secondary | ICD-10-CM | POA: Diagnosis not present

## 2014-04-26 DIAGNOSIS — E119 Type 2 diabetes mellitus without complications: Secondary | ICD-10-CM

## 2014-04-26 DIAGNOSIS — N183 Chronic kidney disease, stage 3 unspecified: Secondary | ICD-10-CM

## 2014-04-26 DIAGNOSIS — I10 Essential (primary) hypertension: Secondary | ICD-10-CM | POA: Diagnosis not present

## 2014-04-26 DIAGNOSIS — N281 Cyst of kidney, acquired: Secondary | ICD-10-CM

## 2014-04-26 LAB — BASIC METABOLIC PANEL WITH GFR
BUN: 19 mg/dL (ref 6–23)
CO2: 30 mEq/L (ref 19–32)
Calcium: 10.2 mg/dL (ref 8.4–10.5)
Chloride: 104 mEq/L (ref 96–112)
Creat: 1.39 mg/dL — ABNORMAL HIGH (ref 0.50–1.10)
GFR, Est African American: 46 mL/min — ABNORMAL LOW
GFR, Est Non African American: 40 mL/min — ABNORMAL LOW
Glucose, Bld: 81 mg/dL (ref 70–99)
Potassium: 4.1 mEq/L (ref 3.5–5.3)
Sodium: 144 mEq/L (ref 135–145)

## 2014-04-26 LAB — POCT GLYCOSYLATED HEMOGLOBIN (HGB A1C): Hemoglobin A1C: 6.4

## 2014-04-26 LAB — GLUCOSE, CAPILLARY: Glucose-Capillary: 83 mg/dL (ref 70–99)

## 2014-04-26 MED ORDER — FUROSEMIDE 40 MG PO TABS: 40.0000 mg | ORAL_TABLET | ORAL | Status: DC

## 2014-04-26 NOTE — Assessment & Plan Note (Signed)
BP Readings from Last 3 Encounters:  04/26/14 130/77  01/25/14 140/73  01/18/14 163/90    Lab Results  Component Value Date   NA 143 01/25/2014   K 4.6 01/25/2014   CREATININE 1.5* 01/25/2014    Assessment: Blood pressure control: controlled Progress toward BP goal:  at goal Comments: none  Plan: Medications:  continue current medications (Coreg 12.5 mg bid, Lasix 40 mg MWF, 20 mg prn, Benicar 40 mg qd, Spironolactone 25 mg qd) Other plans: will check BMET

## 2014-04-26 NOTE — Assessment & Plan Note (Signed)
Lab Results  Component Value Date   HGBA1C 6.4 04/26/2014   HGBA1C 5.9 01/18/2014   HGBA1C 7.1 08/16/2013     Assessment: Diabetes control: good control (HgbA1C at goal) Progress toward A1C goal:  at goal Comments: at goal   Plan: Medications:  continue current medications (Metformin 500 mg qd for now will check Cr. To make sure <1.4) Home glucose monitoring: Frequency: once a day Timing: N/A Other plans: f/u in 3-4 months

## 2014-04-26 NOTE — Assessment & Plan Note (Signed)
Non pitting edema and leg elevation not helping  Will continue Lasix 40 mg MWF and do 20 mg prn-will check with Dr. Sung Amabile to see if this is ok, try leg elevation, consider TED hose but pt worried about expense

## 2014-04-26 NOTE — Progress Notes (Signed)
   Subjective:    Patient ID: [name], female    DOB: Jun 10, 1950, 64 y.o.   MRN: [id number]  HPI Comments: 64 y.o PMH chronic pain, HLD (LDL 93 09/2013), HTN (BP 130/77), DM 2, CKD 3  She presents for f/u  1. DM 2 controlled on Metformin 500 mg qam. Last 66.9 A1C will recheck todaywas 6.4 with cbg of 83.  Will check BMET today as Cr has fluctuated with BL CKD 3 with BL Cr on 1.1-1.3 2. HTN was uncontrolled now more controlled.  Taking Coreg 12.5 bid, Lasix 40 MWF, Bencar 40, Spironolactone 25 mg qd  3 H/o nonobstructing left kidney calculus and left kidney simple cyst with ?septation on CT ab/pelvis 10/2013, ddx is infection, HTN, malignancy per up to date -will repeat US vs MRI in 6-12. 6 mos is now will repeat renal US to eval for sepations  4. B/l feet swelling worse intermittently on the weekends.  Leg elevation is not helping.  Has not tried TED hose in a while due to expense 5.  C/o intermittent urinary odor, denies dysuria, +intermittent urinary retention and delayed urination, denies increased freq. With h/o UTI   HM-will need pna 23 in 12/2014, has not gotten Zoster vx but still has Rx        Review of Systems  Respiratory: Negative for shortness of breath.   Cardiovascular: Positive for leg swelling. Negative for chest pain.  Gastrointestinal: Negative for abdominal pain and constipation.  Genitourinary: Negative for dysuria and vaginal discharge.  Neurological: Negative for headaches.       Objective:   Physical Exam  Constitutional: She is oriented to person, place, and time. Vital signs are normal. She appears well-developed and well-nourished. She is cooperative. No distress.  HENT:  Head: Normocephalic and atraumatic.  Mouth/Throat: Oropharynx is clear and moist and mucous membranes are normal. No oropharyngeal exudate.  Eyes: Conjunctivae are normal. Right eye exhibits no discharge. Left eye exhibits no discharge. No scleral icterus.  Cardiovascular:  Normal rate, regular rhythm and normal heart sounds.   No murmur heard. B/l Pedal edema nonpitting, trace lower ext edema b/l   Pulmonary/Chest: Effort normal and breath sounds normal. No respiratory distress. She has no wheezes.  Abdominal: Soft. Bowel sounds are normal. There is tenderness.    Neurological: She is alert and oriented to person, place, and time. Gait normal.  Skin: Skin is warm, dry and intact. No rash noted. She is not diaphoretic.  Psychiatric: She has a normal mood and affect. Her speech is normal and behavior is normal. Judgment and thought content normal. Cognition and memory are normal.  Nursing note and vitals reviewed.         Assessment & Plan:  F/u in 3-4 months, sooner if needed

## 2014-04-26 NOTE — Assessment & Plan Note (Signed)
No dysuria today but c/o urinary/vaginal odor w/o discharge Will check UA With h/o recurrent UTI consider referral to urology if develops UTI in the future.

## 2014-04-26 NOTE — Assessment & Plan Note (Signed)
Simple cyst with septation noted on left on CT ab/pelvis 10/2013  rec repeat renal US or MRI in 6-12 months Will repeat renal US now to f/u with cystic lesion on the left which was noted to be new.

## 2014-04-26 NOTE — Patient Instructions (Signed)
General Instructions: Follow up in 3-4 months, I will let you know about your blood work We will repeat a renal US to check on your left kidney We will check a urinalysis today Take Lasix 20 mg prn in addition to 40 mg Mon, Weds, Friday. Consider TED hose and let me know when ready   Treatment Goals:  Goals (1 Years of Data) as of 04/26/14          As of Today 01/25/14 01/18/14 01/18/14 12/12/13     Blood Pressure   . Blood Pressure < 140/90  130/77 140/73 163/90 162/90 151/71      Progress Toward Treatment Goals:  Treatment Goal 04/26/2014  Hemoglobin A1C at goal  Blood pressure at goal    Self Care Goals & Plans:  Self Care Goal 04/26/2014  Manage my medications take my medicines as prescribed; bring my medications to every visit; refill my medications on time; follow the sick day instructions if I am sick  Monitor my health keep track of my blood pressure; keep track of my blood glucose  Eat healthy foods eat more vegetables; drink diet soda or water instead of juice or soda; eat foods that are low in salt; eat baked foods instead of fried foods; eat fruit for snacks and desserts; eat smaller portions  Be physically active find an activity I enjoy  Meeting treatment goals maintain the current self-care plan    Home Blood Glucose Monitoring 04/26/2014  Check my blood sugar once a day  When to check my blood sugar N/A     Care Management & Community Referrals:  Referral 04/26/2014  Referrals made for care management support none needed  Referrals made to community resources none    Edema Edema is an abnormal buildup of fluids in your bodytissues. Edema is somewhatdependent on gravity to pull the fluid to the lowest place in your body. That makes the condition more common in the legs and thighs (lower extremities). Painless swelling of the feet and ankles is common and becomes more likely as you get older. It is also common in looser tissues, like around your eyes.  When the  affected area is squeezed, the fluid may move out of that spot and leave a dent for a few moments. This dent is called pitting.  CAUSES  There are many possible causes of edema. Eating too much salt and being on your feet or sitting for a long time can cause edema in your legs and ankles. Hot weather may make edema worse. Common medical causes of edema include:  Heart failure.  Liver disease.  Kidney disease.  Weak blood vessels in your legs.  Cancer.  An injury.  Pregnancy.  Some medications.  Obesity. SYMPTOMS  Edema is usually painless.Your skin may look swollen or shiny.  DIAGNOSIS  Your health care provider may be able to diagnose edema by asking about your medical history and doing a physical exam. You may need to have tests such as X-rays, an electrocardiogram, or blood tests to check for medical conditions that may cause edema.  TREATMENT  Edema treatment depends on the cause. If you have heart, liver, or kidney disease, you need the treatment appropriate for these conditions. General treatment may include:  Elevation of the affected body part above the level of your heart.  Compression of the affected body part. Pressure from elastic bandages or support stockings squeezes the tissues and forces fluid back into the blood vessels. This keeps fluid from entering the  tissues.  Restriction of fluid and salt intake.  Use of a water pill (diuretic). These medications are appropriate only for some types of edema. They pull fluid out of your body and make you urinate more often. This gets rid of fluid and reduces swelling, but diuretics can have side effects. Only use diuretics as directed by your health care provider. HOME CARE INSTRUCTIONS   Keep the affected body part above the level of your heart when you are lying down.   Do not sit still or stand for prolonged periods.   Do not put anything directly under your knees when lying down.  Do not wear constricting  clothing or garters on your upper legs.   Exercise your legs to work the fluid back into your blood vessels. This may help the swelling go down.   Wear elastic bandages or support stockings to reduce ankle swelling as directed by your health care provider.   Eat a low-salt diet to reduce fluid if your health care provider recommends it.   Only take medicines as directed by your health care provider. SEEK MEDICAL CARE IF:   Your edema is not responding to treatment.  You have heart, liver, or kidney disease and notice symptoms of edema.  You have edema in your legs that does not improve after elevating them.   You have sudden and unexplained weight gain. SEEK IMMEDIATE MEDICAL CARE IF:   You develop shortness of breath or chest pain.   You cannot breathe when you lie down.  You develop pain, redness, or warmth in the swollen areas.   You have heart, liver, or kidney disease and suddenly get edema.  You have a fever and your symptoms suddenly get worse. MAKE SURE YOU:   Understand these instructions.  Will watch your condition.  Will get help right away if you are not doing well or get worse. Document Released: 03/09/2005 Document Revised: 07/24/2013 Document Reviewed: 12/30/2012 [facility] Patient Information 2015 [place], [place]. This information is not intended to replace advice given to you by your health care provider. Make sure you discuss any questions you have with your health care provider.

## 2014-04-26 NOTE — Assessment & Plan Note (Signed)
Pt still has Rx for Zoster vaccine but not had Will need pna 23 12/2014

## 2014-04-27 LAB — URINALYSIS, ROUTINE W REFLEX MICROSCOPIC
Bilirubin Urine: NEGATIVE
Glucose, UA: NEGATIVE mg/dL
Hgb urine dipstick: NEGATIVE
Ketones, ur: NEGATIVE mg/dL
Leukocytes, UA: NEGATIVE
Nitrite: NEGATIVE
Protein, ur: NEGATIVE mg/dL
Specific Gravity, Urine: <1.005 — ABNORMAL LOW (ref 1.005–1.030)
Urobilinogen, UA: 0.2 mg/dL (ref 0.0–1.0)
pH: 5 (ref 5.0–8.0)

## 2014-04-27 NOTE — Progress Notes (Signed)
Medicine attending: Medical history, presenting problems, physical findings, and medications, reviewed with Dr Karlyn Agee  and I concur with her evaluation and management plan.

## 2014-05-14 ENCOUNTER — Telehealth: Payer: Self-pay | Admitting: *Deleted

## 2014-05-14 NOTE — Telephone Encounter (Signed)
Pt calls and states she would like to take her metformin later in the day because she has noticed that her sugars are normal except in the mornings when she awakes and they are from 140 to 150, she states she does have snacks at night because she gets hungry, her blood sugars throughout the day are always normal. She also would like for donna p. To call her and possibly advise her on snacks to have at night and possibly some pointers in general nutrition wise, sending this to donna also

## 2014-05-14 NOTE — Telephone Encounter (Signed)
Patient wanted to know why her fasting blood sugars are always higher and if she can try taking her metformin in the Pm, to try to lower them. Normally pretty high in mornings-157, 143,145, later in day readings are: 94, 89, 94, 105.  She is going to start taking her 500 mg metformin in the pm TODAY. We also discussed the importance of exercise with out 2 days off in a row recommendation and it's effect on fasting blood sugars which she plans to restart. We  also talked about extended release metformin may help her have 24 hours coverage, she liked this idea and asked me to mention it to Dr. Aundra Dubin.

## 2014-05-17 ENCOUNTER — Ambulatory Visit (INDEPENDENT_AMBULATORY_CARE_PROVIDER_SITE_OTHER): Payer: Commercial Managed Care - HMO | Admitting: Internal Medicine

## 2014-05-17 ENCOUNTER — Encounter: Payer: Self-pay | Admitting: Internal Medicine

## 2014-05-17 VITALS — BP 130/77 | HR 77 | Temp 97.9°F | Ht 68.0 in | Wt 225.8 lb

## 2014-05-17 DIAGNOSIS — J069 Acute upper respiratory infection, unspecified: Secondary | ICD-10-CM

## 2014-05-17 DIAGNOSIS — E119 Type 2 diabetes mellitus without complications: Secondary | ICD-10-CM | POA: Diagnosis not present

## 2014-05-17 MED ORDER — SALINE SPRAY 0.65 % NA SOLN: 1.0000 | Freq: Four times a day (QID) | NASAL | Status: DC

## 2014-05-17 MED ORDER — LORATADINE 10 MG PO TABS: 10.0000 mg | ORAL_TABLET | Freq: Every day | ORAL | Status: DC

## 2014-05-17 NOTE — Patient Instructions (Addendum)
General Instructions: -You have laryngitis that may have been caused by a viral infection. You may use sugar-free ozenges to sooth your throat.  -Start taking Claritin 10mg  daily to help with your symptoms.  -Use saline nasal spray 4 times per day to keep your sinuses moist.  -Avoid nasal decongestants as they may lead to an increase in your blood pressure.  -Please follow up with [place] in 1-2 weeks if your symptoms worsen or if you have fever/chills, nausea, vomiting, or diarrhea.    Please bring your medicines with you each time you come to clinic.  Medicines may include prescription medications, over-the-counter medications, herbal remedies, eye drops, vitamins, or other pills.   Progress Toward Treatment Goals:  Treatment Goal 04/26/2014  Hemoglobin A1C at goal  Blood pressure at goal    Self Care Goals & Plans:  Self Care Goal 05/17/2014  Manage my medications take my medicines as prescribed; bring my medications to every visit; refill my medications on time; follow the sick day instructions if I am sick  Monitor my health keep track of my blood glucose; keep track of my blood pressure; keep track of my weight; check my feet daily  Eat healthy foods eat more vegetables; eat fruit for snacks and desserts; eat foods that are low in salt; eat baked foods instead of fried foods; eat smaller portions; drink diet soda or water instead of juice or soda  Be physically active find an activity I enjoy  Meeting treatment goals -    Home Blood Glucose Monitoring 04/26/2014  Check my blood sugar once a day  When to check my blood sugar N/A     Care Management & Community Referrals:  Referral 04/26/2014  Referrals made for care management support none needed  Referrals made to community resources none

## 2014-05-17 NOTE — Assessment & Plan Note (Signed)
Her symptoms of laryngitis, runny nose, cough with scant sputum, sorethroat, are most likely c/w viral etiology though a component of allergic rhinitis may also be present. Given that she has no fever/chill, and has has the symptoms for only 2 days, and with with bacterial infx less likely, Abx treatment not indicated at this time.  Will treat with conservative management.  -saline nasal spray 4 times daily -Claritin 10mg  daily -sugar-free lozenges with salt water gargle.

## 2014-05-17 NOTE — Assessment & Plan Note (Signed)
Lab Results  Component Value Date   HGBA1C 6.4 04/26/2014   HGBA1C 5.9 01/18/2014   HGBA1C 7.1 08/16/2013     Assessment: Diabetes control:  controlled Progress toward A1C goal:   at goal Comments: she is on metformin 500mg  daily with breakfast. She reports blood sugar in 150s this morning which is a little higher than her usual 120-130.   Plan: Medications:  continue current medications Home glucose monitoring: Frequency:   Timing:   Instruction/counseling given: reminded to bring medications to each visit Educational resources provided: brochure, handout Self management tools provided:   Other plans: Her elevation in blood sugar may be transient, related to her current illness.

## 2014-05-17 NOTE — Progress Notes (Signed)
   Subjective:    Patient ID: [name], female    DOB: Jun 22, 1950, 64 y.o.   MRN: [id number]  HPI Ms. Fetcho is a 64 yr old woman with PMH of HTN, CKD3, DM2, presenting for evaluation of sorethroat, rhinorrhea with yellow discharge, hoarseness, and cough productive of scant yellow discharge for the past 2 days. She states that her daughter was recently diagnosed with an upper respiratory virus and she thinks she got this from her. She denies fever/chills, Nausea, vomiting, diarrhea, or decreased appetite. She has tried salt water gargle and sugar-free lozenges with some relief of her sorethroat.  She also has seasonal allergies but does not take medication for this on a regular basis.  Her blood sugars have been in the 150s range which is a little high for her but she denies polydipsia, polyuria.    Review of Systems  Constitutional: Negative for fever, chills, diaphoresis, activity change, appetite change, fatigue and unexpected weight change.  HENT: Positive for congestion, postnasal drip, rhinorrhea, sinus pressure, sore throat and voice change. Negative for ear pain, hearing loss, nosebleeds and sneezing.   Eyes: Negative for pain and visual disturbance.  Respiratory: Positive for cough. Negative for shortness of breath and wheezing.   Cardiovascular: Negative for chest pain, palpitations and leg swelling.  Gastrointestinal: Negative for nausea, vomiting, abdominal pain and diarrhea.  Genitourinary: Negative for dysuria and difficulty urinating.  Musculoskeletal: Positive for myalgias.  Skin: Negative for color change and rash.  Neurological: Negative for dizziness, weakness and light-headedness.  Psychiatric/Behavioral: Negative for agitation.       Objective:   Physical Exam  Constitutional: She is oriented to person, place, and time. She appears well-developed and well-nourished. No distress.  HENT:  Right Ear: External ear normal.  Left Ear: External ear normal.    Mouth/Throat: Oropharynx is clear and moist. No oropharyngeal exudate.  Pale nasal turbinates bilaterally with no discharge  Eyes: Conjunctivae are normal. Pupils are equal, round, and reactive to light. Right eye exhibits no discharge. Left eye exhibits no discharge.  Neck: Neck supple.  Cardiovascular: Normal rate and regular rhythm.   Pulmonary/Chest: Effort normal and breath sounds normal. No stridor. No respiratory distress. She has no wheezes. She has no rales.  Abdominal: Soft. She exhibits no distension. There is no tenderness.  Lymphadenopathy:    She has no cervical adenopathy.  Neurological: She is alert and oriented to person, place, and time. Coordination normal.  Skin: Skin is warm and dry. No rash noted. She is not diaphoretic.  Psychiatric: She has a normal mood and affect.  Nursing note and vitals reviewed.         Assessment & Plan:

## 2014-05-19 ENCOUNTER — Other Ambulatory Visit: Payer: Self-pay | Admitting: Internal Medicine

## 2014-05-21 ENCOUNTER — Other Ambulatory Visit: Payer: Self-pay | Admitting: Internal Medicine

## 2014-05-21 DIAGNOSIS — I10 Essential (primary) hypertension: Secondary | ICD-10-CM

## 2014-05-21 MED ORDER — CARVEDILOL 12.5 MG PO TABS: 12.5000 mg | ORAL_TABLET | Freq: Two times a day (BID) | ORAL | Status: DC

## 2014-05-21 NOTE — Progress Notes (Signed)
Medicine attending: Medical history, presenting problems, physical findings, and medications, reviewed with Dr Soli Kennerly on the day of the patient visit and I concur with her evaluation and management plan. 

## 2014-05-28 ENCOUNTER — Ambulatory Visit (HOSPITAL_COMMUNITY)
Admission: RE | Admit: 2014-05-28 | Discharge: 2014-05-28 | Disposition: A | Discharge status: Home / Self Care | Payer: Commercial Managed Care - HMO | Source: Ambulatory Visit | Attending: Oncology | Admitting: Oncology

## 2014-05-28 DIAGNOSIS — N281 Cyst of kidney, acquired: Secondary | ICD-10-CM

## 2014-05-28 DIAGNOSIS — N2889 Other specified disorders of kidney and ureter: Secondary | ICD-10-CM | POA: Diagnosis not present

## 2014-07-03 ENCOUNTER — Encounter (HOSPITAL_COMMUNITY): Payer: Self-pay

## 2014-07-03 ENCOUNTER — Ambulatory Visit (HOSPITAL_COMMUNITY)
Admission: RE | Admit: 2014-07-03 | Discharge: 2014-07-03 | Disposition: A | Discharge status: Home / Self Care | Payer: Commercial Managed Care - HMO | Source: Ambulatory Visit | Attending: Internal Medicine | Admitting: Internal Medicine

## 2014-07-03 VITALS — BP 113/71 | HR 80 | Resp 18 | Wt 225.2 lb

## 2014-07-03 DIAGNOSIS — I5032 Chronic diastolic (congestive) heart failure: Secondary | ICD-10-CM | POA: Diagnosis not present

## 2014-07-03 DIAGNOSIS — Z79811 Long term (current) use of aromatase inhibitors: Secondary | ICD-10-CM | POA: Insufficient documentation

## 2014-07-03 DIAGNOSIS — Z9221 Personal history of antineoplastic chemotherapy: Secondary | ICD-10-CM | POA: Diagnosis not present

## 2014-07-03 DIAGNOSIS — E669 Obesity, unspecified: Secondary | ICD-10-CM | POA: Insufficient documentation

## 2014-07-03 DIAGNOSIS — Z7982 Long term (current) use of aspirin: Secondary | ICD-10-CM | POA: Diagnosis not present

## 2014-07-03 DIAGNOSIS — K219 Gastro-esophageal reflux disease without esophagitis: Secondary | ICD-10-CM | POA: Diagnosis not present

## 2014-07-03 DIAGNOSIS — Z79899 Other long term (current) drug therapy: Secondary | ICD-10-CM | POA: Diagnosis not present

## 2014-07-03 DIAGNOSIS — E785 Hyperlipidemia, unspecified: Secondary | ICD-10-CM | POA: Insufficient documentation

## 2014-07-03 DIAGNOSIS — J45909 Unspecified asthma, uncomplicated: Secondary | ICD-10-CM | POA: Diagnosis not present

## 2014-07-03 DIAGNOSIS — C50919 Malignant neoplasm of unspecified site of unspecified female breast: Secondary | ICD-10-CM

## 2014-07-03 DIAGNOSIS — I10 Essential (primary) hypertension: Secondary | ICD-10-CM | POA: Diagnosis not present

## 2014-07-03 DIAGNOSIS — Z8673 Personal history of transient ischemic attack (TIA), and cerebral infarction without residual deficits: Secondary | ICD-10-CM | POA: Insufficient documentation

## 2014-07-03 DIAGNOSIS — Z170 Estrogen receptor positive status [ER+]: Secondary | ICD-10-CM | POA: Diagnosis not present

## 2014-07-03 NOTE — Patient Instructions (Signed)
Doing great!  Follow up 1 year. Call us beforehand if you need [place]!  Do the following things EVERYDAY: 1) Weigh yourself in the morning before breakfast. Write it down and keep it in a log. 2) Take your medicines as prescribed 3) Eat low salt foods-Limit salt (sodium) to 2000 mg per day.  4) Stay as active as you can everyday 5) Limit all fluids for the day to less than 2 liters

## 2014-07-03 NOTE — Progress Notes (Signed)
Patient ID: [name], female   DOB: October 05, 1950, 64 y.o.   MRN: [id number]    HPI: [name] is a 64 y/o women with h/o obesity, HTN, asthma and HL. She also has history of TIA in April 2012.    Found to have L ductal breast CA. ER/PR +. HER 2 neu was equivocal. s/p bilateral mastectomies in 01/2010. Lymph nodes negative.   She completed herceptin therapy Jan. 2013.  She returns for follow up. Denies SOB/PND/Orthopnea. Taking all medications. Not exercising.   Echo April 10, 2010 EF 55-60%. Grade 1 diastolic dusfunction. MUGA 2/12 EF 73%  Echo April 9,2012 EF 60-65% lat s' 10.43 (? Artifact) Echo 12/13/10 60-65% poor windows for lat s' peak I see is 8.09 ECHO 02/24/12 EF 55-60% lateral s' 6.5 ECHO 07/02/11 EF 55-60% lateral S' 6.9 RV mildly dilated but normal function Echo 05/30/2012: EF 60-65% lat s' 7.1, Grade 1 diastolic dysfunction September 2012 we changed HCTZ to lasix for longstanding LE edema (since her daughter was born in 71).  Myoview 11/14 LV Ejection Fraction: 68%. LV Wall Motion: NL LV Function; NL Wall Motion    ROS: All systems negative except as listed in HPI, PMH and Problem List.  Past Medical History  Diagnosis Date  . Depression     seasonal melancholia  . Hyperlipidemia   . Hypertension   . Myopathy     not statin related, cause unknown   . Back pain   . Edema extremities   . Chemotherapy follow-up examination 03/06/2011  . Cough 03/06/2011  . Arthritis     DJD, lumbar spondylosis  . Bronchitis 03/10/2012  . Asthma   . GERD (gastroesophageal reflux disease)   . Cataract   . Breast cancer     left breast invasive ductal ca in remission as of 10/07/11 s/p double mastectomy  . E. coli UTI   . Ileitis   . Kidney stones     non obstructive   . Bilateral renal cysts   . Colon polyps    Current Outpatient Prescriptions on File Prior to Encounter  Medication Sig Dispense Refill  . albuterol (PROVENTIL HFA;VENTOLIN HFA) 108 (90 BASE) MCG/ACT inhaler  Inhale 1-2 puffs into the lungs every 6 (six) hours as needed for wheezing or shortness of breath. 18 g 2  . aspirin 81 MG tablet Take 81 mg by mouth daily.    . Blood Pressure KIT 1 Device by Does not apply route daily. 1 each 0  . carvedilol (COREG) 12.5 MG tablet Take 1 tablet (12.5 mg total) by mouth 2 (two) times daily with a meal. 180 tablet 0  . cholecalciferol (VITAMIN D) 1000 UNITS tablet Take 1,000 Units by mouth daily.    . ferrous sulfate 325 (65 FE) MG tablet Take 1 tablet (325 mg total) by mouth 3 (three) times daily with meals. 90 tablet 6  . Fluticasone-Salmeterol (ADVAIR) 100-50 MCG/DOSE AEPB Inhale 1 puff into the lungs 2 (two) times daily as needed (wheezing). 180 each 3  . furosemide (LASIX) 40 MG tablet Take 1 tablet (40 mg total) by mouth every Monday, Wednesday, and Friday. 90 tablet 1  . letrozole (FEMARA) 2.5 MG tablet TAKE 1 TABLET (2.5 MG TOTAL) BY MOUTH DAILY. 90 tablet 1  . loratadine (CLARITIN) 10 MG tablet Take 1 tablet (10 mg total) by mouth daily. 30 tablet 2  . LORazepam (ATIVAN) 1 MG tablet Take 1 tablet (1 mg total) by mouth daily as needed. for anxiety 90 tablet 3  .  metFORMIN (GLUCOPHAGE) 500 MG tablet Take 1 tablet (500 mg total) by mouth daily with breakfast. 90 tablet 3  . Multiple Vitamin (MULTIVITAMIN) tablet Take 1 tablet by mouth daily.     Marland Kitchen olmesartan (BENICAR) 40 MG tablet Take 1 tablet (40 mg total) by mouth daily. 90 tablet 3  . pantoprazole (PROTONIX) 40 MG tablet Take 1 tablet (40 mg total) by mouth daily. 90 tablet 3  . potassium chloride SA (K-DUR,KLOR-CON) 10 MEQ tablet Take 1 tablet (10 mEq total) by mouth daily. When you take Lasix 30 tablet 3  . Simethicone 125 MG CAPS Take 1 capsule (125 mg total) by mouth 4 (four) times daily as needed (gas, bloating). 90 each 2  . simvastatin (ZOCOR) 20 MG tablet TAKE 1 TABLET (20 MG TOTAL) BY MOUTH AT BEDTIME. 90 tablet 3  . sodium chloride (OCEAN) 0.65 % SOLN nasal spray Place 1 spray into both nostrils  4 (four) times daily. 2 Bottle 0  . spironolactone (ALDACTONE) 25 MG tablet Take 1 tablet (25 mg total) by mouth daily. 90 tablet 3  . traMADol (ULTRAM) 50 MG tablet Take 1 tablet (50 mg total) by mouth every 6 (six) hours as needed. For pain (Patient not taking: Reported on 04/26/2014) 90 tablet 5  . triamcinolone cream (KENALOG) 0.1 % Apply topically 2 (two) times daily as needed. 454 g 0  . venlafaxine (EFFEXOR) 37.5 MG tablet Take 1 tablet (37.5 mg total) by mouth daily. 90 tablet 1   Current Facility-Administered Medications on File Prior to Encounter  Medication Dose Route Frequency Provider Last Rate Last Dose  . sodium chloride 0.9 % injection 10 mL  10 mL Intravenous PRN Consuela Mimes, MD   10 mL at 01/26/11 1035    Filed Vitals:   07/03/14 1114  BP: 113/71  Pulse: 80  Resp: 18  Weight: 225 lb 4 oz (102.173 kg)  SpO2: 99%   PHYSICAL EXAM: General:  Well appearing. No resp difficulty; daughters present HEENT: normal Neck: supple. JVP 5-6. Carotids 2+ bilaterally; no bruits. No lymphadenopathy or thryomegaly appreciated. Cor: PMI normal. Regular rate & rhythm. No rubs, murmurs; tender to palpate chest on R side Lungs: clear Abdomen: obese, soft, nontender, nondistended. No hepatosplenomegaly. No bruits or masses. Good bowel sounds. Extremities: no cyanosis, clubbing, rash.  No lower extremity edema Neuro: alert & orientedx3, cranial nerves grossly intact. Moves all 4 extremities w/o difficulty. Affect pleasant.  ASSESSMENT & PLAN:  1) CP- Lexi Scan Myoview 11/14 was negative. CP resolved.  2) Chronic diastolic HF - volume status stable. Continue lasix 3 times a week.needs to exercise.    3) Breast cancer - finished chemo now in remission. EF stable 4) HTN - Well controlled.   F/U as needed    CLEGG,AMY NP-C  11:12 AM  Patient seen and examined with [name] Grinder, NP. We discussed all aspects of the encounter. I agree with the assessment and plan as stated above.   Doing  very well. No further CP. BP well controlled. Now 3 years s/p chemo.   Hamzah Savoca,MD 11:42 AM

## 2014-07-03 NOTE — Addendum Note (Signed)
Encounter addended by: Effie Berkshire, RN on: 07/03/2014 11:48 AM<BR>     Documentation filed: Patient Instructions Section

## 2014-07-30 ENCOUNTER — Other Ambulatory Visit: Payer: Self-pay | Admitting: *Deleted

## 2014-07-30 DIAGNOSIS — C50919 Malignant neoplasm of unspecified site of unspecified female breast: Secondary | ICD-10-CM

## 2014-07-31 ENCOUNTER — Telehealth: Payer: Self-pay | Admitting: Hematology and Oncology

## 2014-07-31 ENCOUNTER — Ambulatory Visit (HOSPITAL_BASED_OUTPATIENT_CLINIC_OR_DEPARTMENT_OTHER): Payer: Commercial Managed Care - HMO | Admitting: Hematology and Oncology

## 2014-07-31 ENCOUNTER — Other Ambulatory Visit (HOSPITAL_BASED_OUTPATIENT_CLINIC_OR_DEPARTMENT_OTHER): Payer: Commercial Managed Care - HMO

## 2014-07-31 VITALS — BP 116/64 | HR 80 | Temp 98.1°F | Resp 18 | Ht 68.0 in | Wt 227.2 lb

## 2014-07-31 DIAGNOSIS — C50912 Malignant neoplasm of unspecified site of left female breast: Secondary | ICD-10-CM

## 2014-07-31 DIAGNOSIS — C50919 Malignant neoplasm of unspecified site of unspecified female breast: Secondary | ICD-10-CM

## 2014-07-31 LAB — COMPREHENSIVE METABOLIC PANEL (CC13)
ALT: 20 U/L (ref 0–55)
AST: 17 U/L (ref 5–34)
Albumin: 4 g/dL (ref 3.5–5.0)
Alkaline Phosphatase: 64 U/L (ref 40–150)
Anion Gap: 12 mEq/L — ABNORMAL HIGH (ref 3–11)
BUN: 39.1 mg/dL — ABNORMAL HIGH (ref 7.0–26.0)
CO2: 23 mEq/L (ref 22–29)
Calcium: 9.6 mg/dL (ref 8.4–10.4)
Chloride: 109 mEq/L (ref 98–109)
Creatinine: 2.2 mg/dL — ABNORMAL HIGH (ref 0.6–1.1)
EGFR: 27 mL/min/{1.73_m2} — ABNORMAL LOW (ref >90)
Glucose: 143 mg/dl — ABNORMAL HIGH (ref 70–140)
Potassium: 4.7 mEq/L (ref 3.5–5.1)
Sodium: 144 mEq/L (ref 136–145)
Total Bilirubin: 0.37 mg/dL (ref 0.20–1.20)
Total Protein: 7.1 g/dL (ref 6.4–8.3)

## 2014-07-31 LAB — CBC WITH DIFFERENTIAL/PLATELET
BASO%: 1 % (ref 0.0–2.0)
Basophils Absolute: 0.1 10*3/uL (ref 0.0–0.1)
EOS%: 6.3 % (ref 0.0–7.0)
Eosinophils Absolute: 0.4 10*3/uL (ref 0.0–0.5)
HCT: 30.6 % — ABNORMAL LOW (ref 34.8–46.6)
HGB: 10.2 g/dL — ABNORMAL LOW (ref 11.6–15.9)
LYMPH%: 35.8 % (ref 14.0–49.7)
MCH: 29.2 pg (ref 25.1–34.0)
MCHC: 33.4 g/dL (ref 31.5–36.0)
MCV: 87.4 fL (ref 79.5–101.0)
MONO#: 0.4 10*3/uL (ref 0.1–0.9)
MONO%: 6.8 % (ref 0.0–14.0)
NEUT#: 3.1 10*3/uL (ref 1.5–6.5)
NEUT%: 50.1 % (ref 38.4–76.8)
Platelets: 247 10*3/uL (ref 145–400)
RBC: 3.5 10*6/uL — ABNORMAL LOW (ref 3.70–5.45)
RDW: 13.3 % (ref 11.2–14.5)
WBC: 6.2 10*3/uL (ref 3.9–10.3)
lymph#: 2.2 10*3/uL (ref 0.9–3.3)

## 2014-07-31 NOTE — Progress Notes (Signed)
Patient Care Team: Cresenciano Genre, MD as PCP - General (Internal Medicine) Jolaine Artist, MD as Referring Physician (Cardiology) Rutherford Guys, MD as Consulting Physician (Ophthalmology)  DIAGNOSIS: Breast cancer, left breast   Staging form: Breast, AJCC 7th Edition     Clinical: Stage IA (T1c, N0, cM0) - Unsigned     Pathologic: No stage assigned - Unsigned   SUMMARY OF ONCOLOGIC HISTORY:   Breast cancer, left breast   01/15/2010 Surgery  bilateral mastectomies: left breast IDC , ER/PR positive HER-2 positive  stage II a;  right breast benign   04/17/2010 - 04/17/2011 Chemotherapy weekly [place] carboplatinum and Herceptin x 6 cycles  followed by Herceptin maintenance   09/01/2010 -  Anti-estrogen oral therapy  letrozole 2.5 mg daily    CHIEF COMPLIANT:  Follow-up of breast cancer  On letrozole  INTERVAL HISTORY: [name] is a  64 year old with above-mentioned history of left-sided breast cancer 1 and bilateral mastectomy followed by adjuvant chemotherapy with [place] followed by Herceptin maintenance. She started letrozole 09/01/2010 and is tolerating it extremely well. She had a lot of hot flashes even prior to starting antiestrogen therapy. These are being reasonably well managed without any medications by herself. She denies any new lumps or nodules in her chest wall or axilla.  REVIEW OF SYSTEMS:   Constitutional: Denies fevers, chills or abnormal weight loss Eyes: Denies blurriness of vision Ears, nose, mouth, throat, and face: Denies mucositis or sore throat Respiratory: Denies cough, dyspnea or wheezes Cardiovascular: Denies palpitation, chest discomfort or lower extremity swelling Gastrointestinal:  Denies nausea, heartburn or change in bowel habits Skin: Denies abnormal skin rashes Lymphatics: Denies new lymphadenopathy or easy bruising Neurological:Denies numbness, tingling or new weaknesses Behavioral/Psych: Mood is stable, no new changes  Breast:  denies any  pain or lumps or nodules in either side All other systems were reviewed with the patient and are negative.  I have reviewed the past medical history, past surgical history, social history and family history with the patient and they are unchanged from previous note.  ALLERGIES:  is allergic to atorvastatin; hctz; norvasc; and rosuvastatin.  MEDICATIONS:  Current Outpatient Prescriptions  Medication Sig Dispense Refill  . albuterol (PROVENTIL HFA;VENTOLIN HFA) 108 (90 BASE) MCG/ACT inhaler Inhale 1-2 puffs into the lungs every 6 (six) hours as needed for wheezing or shortness of breath. 18 g 2  . aspirin 81 MG tablet Take 81 mg by mouth daily.    . Blood Pressure KIT 1 Device by Does not apply route daily. 1 each 0  . carvedilol (COREG) 12.5 MG tablet Take 1 tablet (12.5 mg total) by mouth 2 (two) times daily with a meal. 180 tablet 0  . cholecalciferol (VITAMIN D) 1000 UNITS tablet Take 1,000 Units by mouth daily.    . ferrous sulfate 325 (65 FE) MG tablet Take 1 tablet (325 mg total) by mouth 3 (three) times daily with meals. 90 tablet 6  . Fluticasone-Salmeterol (ADVAIR) 100-50 MCG/DOSE AEPB Inhale 1 puff into the lungs 2 (two) times daily as needed (wheezing). 180 each 3  . furosemide (LASIX) 40 MG tablet Take 1 tablet (40 mg total) by mouth every Monday, Wednesday, and Friday. 90 tablet 1  . letrozole (FEMARA) 2.5 MG tablet TAKE 1 TABLET (2.5 MG TOTAL) BY MOUTH DAILY. 90 tablet 1  . loratadine (CLARITIN) 10 MG tablet Take 1 tablet (10 mg total) by mouth daily. 30 tablet 2  . LORazepam (ATIVAN) 1 MG tablet Take 1 tablet (1 mg  total) by mouth daily as needed. for anxiety 90 tablet 3  . metFORMIN (GLUCOPHAGE) 500 MG tablet Take 1 tablet (500 mg total) by mouth daily with breakfast. 90 tablet 3  . Multiple Vitamin (MULTIVITAMIN) tablet Take 1 tablet by mouth daily.     Marland Kitchen olmesartan (BENICAR) 40 MG tablet Take 1 tablet (40 mg total) by mouth daily. 90 tablet 3  . pantoprazole (PROTONIX) 40 MG  tablet Take 1 tablet (40 mg total) by mouth daily. 90 tablet 3  . potassium chloride SA (K-DUR,KLOR-CON) 10 MEQ tablet Take 1 tablet (10 mEq total) by mouth daily. When you take Lasix 30 tablet 3  . Simethicone 125 MG CAPS Take 1 capsule (125 mg total) by mouth 4 (four) times daily as needed (gas, bloating). 90 each 2  . simvastatin (ZOCOR) 20 MG tablet TAKE 1 TABLET (20 MG TOTAL) BY MOUTH AT BEDTIME. 90 tablet 3  . sodium chloride (OCEAN) 0.65 % SOLN nasal spray Place 1 spray into both nostrils 4 (four) times daily. 2 Bottle 0  . spironolactone (ALDACTONE) 25 MG tablet Take 1 tablet (25 mg total) by mouth daily. 90 tablet 3  . traMADol (ULTRAM) 50 MG tablet Take 1 tablet (50 mg total) by mouth every 6 (six) hours as needed. For pain 90 tablet 5  . triamcinolone cream (KENALOG) 0.1 % Apply topically 2 (two) times daily as needed. 454 g 0  . venlafaxine (EFFEXOR) 37.5 MG tablet Take 1 tablet (37.5 mg total) by mouth daily. 90 tablet 1  . PRODIGY NO CODING BLOOD GLUC test strip      No current facility-administered medications for this visit.   Facility-Administered Medications Ordered in Other Visits  Medication Dose Route Frequency Provider Last Rate Last Dose  . sodium chloride 0.9 % injection 10 mL  10 mL Intravenous PRN Consuela Mimes, MD   10 mL at 01/26/11 1035    PHYSICAL EXAMINATION: ECOG PERFORMANCE STATUS: 1 - Symptomatic but completely ambulatory  Filed Vitals:   07/31/14 1029  BP: 116/64  Pulse: 80  Temp: 98.1 F (36.7 C)  Resp: 18   Filed Weights   07/31/14 1029  Weight: 227 lb 3.2 oz (103.057 kg)    GENERAL:alert, no distress and comfortable SKIN: skin color, texture, turgor are normal, no rashes or significant lesions EYES: normal, Conjunctiva are pink and non-injected, sclera clear OROPHARYNX:no exudate, no erythema and lips, buccal mucosa, and tongue normal  NECK: supple, thyroid normal size, non-tender, without nodularity LYMPH:  no palpable lymphadenopathy in  the cervical, axillary or inguinal LUNGS: clear to auscultation and percussion with normal breathing effort HEART: regular rate & rhythm and no murmurs and no lower extremity edema ABDOMEN:abdomen soft, non-tender and normal bowel sounds Musculoskeletal:no cyanosis of digits and no clubbing  NEURO: alert & oriented x 3 with fluent speech, no focal motor/sensory deficits BREAST: no palpable lumps or nodules in chest wall or axilla (exam performed in the presence of a chaperone)  LABORATORY DATA:  I have reviewed the data as listed   Chemistry      Component Value Date/Time   NA 144 04/26/2014 1444   NA 143 01/25/2014 1019   K 4.1 04/26/2014 1444   K 4.6 01/25/2014 1019   CL 104 04/26/2014 1444   CL 103 04/25/2012 1045   CO2 30 04/26/2014 1444   CO2 26 01/25/2014 1019   BUN 19 04/26/2014 1444   BUN 27.0* 01/25/2014 1019   CREATININE 1.39* 04/26/2014 1444   CREATININE 1.5* 01/25/2014  1019   CREATININE 1.35* 12/20/2013 1131      Component Value Date/Time   CALCIUM 10.2 04/26/2014 1444   CALCIUM 10.1 01/25/2014 1019   ALKPHOS 66 01/25/2014 1019   ALKPHOS 56 10/12/2013 1204   AST 17 01/25/2014 1019   AST 21 10/12/2013 1204   ALT 22 01/25/2014 1019   ALT 23 10/12/2013 1204   BILITOT 0.58 01/25/2014 1019   BILITOT 0.7 10/12/2013 1204       Lab Results  Component Value Date   WBC 6.2 07/31/2014   HGB 10.2* 07/31/2014   HCT 30.6* 07/31/2014   MCV 87.4 07/31/2014   PLT 247 07/31/2014   NEUTROABS 3.1 07/31/2014   ASSESSMENT & PLAN:  Breast cancer, left breast  Left breast invasive ductal carcinoma stage II a ER/PR positive HER-2 positive status post bilateral mastectomies (Right CVA, Rt IJ DVT)  Followed by 6 cycles of [place] followed by Herceptin maintenance completed 04/17/2011    Current treatment: letrozole 2.5 mg daily started June 2012  Letrozole toxicities: 1.  Hot flashes 2.  Increased tearing in the eyes  Breast Cancer Surveillance: 1. Breast exam  07/31/2014:  Normal 2.  No role of imaging studies since she had bilateral mastectomies 3.  Bone density June 2015: normal T score +2.2    Survivorship: patient has not been doing any physical activity. I recommended that she go back to walking and joining a gym. She promised to do that and when she comes back in a year she said she would look like Charyl Bigger!   return to clinic once a year for follow-up. She will complete antiestrogen therapy by June 2017.  No orders of the defined types were placed in this encounter.   The patient has a good understanding of the overall plan. she agrees with it. she will call with any problems that may develop before the next visit here.   Rulon Eisenmenger, MD

## 2014-07-31 NOTE — Addendum Note (Signed)
Addended by: Wyline Beady A on: 07/31/2014 01:30 PM   Modules accepted: Medications

## 2014-07-31 NOTE — Telephone Encounter (Signed)
°  Appointments made and avs   anne

## 2014-07-31 NOTE — Assessment & Plan Note (Signed)
Left breast invasive ductal carcinoma stage II a ER/PR positive HER-2 positive status post bilateral mastectomies (Right CVA, Rt IJ DVT)  Followed by 6 cycles of [place] followed by Herceptin maintenance completed 04/17/2011    Current treatment: letrozole 2.5 mg daily started June 2012  Letrozole toxicities: 1.  Hot flashes 2.  Increased tearing in the eyes  Breast Cancer Surveillance: 1. Breast exam  07/31/2014: Normal 2.  No role of imaging studies since she had bilateral mastectomies 3.  Bone density June 2015: normal T score +2.2    return to clinic once a year for follow-up. She will complete antiestrogen therapy by June 2017.

## 2014-08-08 ENCOUNTER — Encounter: Payer: Self-pay | Admitting: *Deleted

## 2014-08-22 ENCOUNTER — Telehealth: Payer: Self-pay | Admitting: Internal Medicine

## 2014-08-22 NOTE — Telephone Encounter (Signed)
Call to patient to confirm appointment for 08/23/14 at 1:45 lmtcb

## 2014-08-23 ENCOUNTER — Ambulatory Visit (INDEPENDENT_AMBULATORY_CARE_PROVIDER_SITE_OTHER): Payer: Commercial Managed Care - HMO | Admitting: Internal Medicine

## 2014-08-23 ENCOUNTER — Encounter: Payer: Self-pay | Admitting: Internal Medicine

## 2014-08-23 VITALS — BP 102/66 | HR 70 | Temp 98.5°F | Ht 68.0 in | Wt 226.8 lb

## 2014-08-23 DIAGNOSIS — N179 Acute kidney failure, unspecified: Secondary | ICD-10-CM

## 2014-08-23 DIAGNOSIS — C50912 Malignant neoplasm of unspecified site of left female breast: Secondary | ICD-10-CM

## 2014-08-23 DIAGNOSIS — I10 Essential (primary) hypertension: Secondary | ICD-10-CM

## 2014-08-23 DIAGNOSIS — E1122 Type 2 diabetes mellitus with diabetic chronic kidney disease: Secondary | ICD-10-CM

## 2014-08-23 DIAGNOSIS — E1165 Type 2 diabetes mellitus with hyperglycemia: Secondary | ICD-10-CM

## 2014-08-23 DIAGNOSIS — Z0000 Encounter for general adult medical examination without abnormal findings: Secondary | ICD-10-CM

## 2014-08-23 DIAGNOSIS — N183 Chronic kidney disease, stage 3 unspecified: Secondary | ICD-10-CM

## 2014-08-23 DIAGNOSIS — E785 Hyperlipidemia, unspecified: Secondary | ICD-10-CM

## 2014-08-23 DIAGNOSIS — R110 Nausea: Secondary | ICD-10-CM

## 2014-08-23 DIAGNOSIS — R42 Dizziness and giddiness: Secondary | ICD-10-CM

## 2014-08-23 DIAGNOSIS — I129 Hypertensive chronic kidney disease with stage 1 through stage 4 chronic kidney disease, or unspecified chronic kidney disease: Secondary | ICD-10-CM

## 2014-08-23 DIAGNOSIS — N184 Chronic kidney disease, stage 4 (severe): Secondary | ICD-10-CM | POA: Diagnosis not present

## 2014-08-23 DIAGNOSIS — I951 Orthostatic hypotension: Secondary | ICD-10-CM

## 2014-08-23 DIAGNOSIS — H539 Unspecified visual disturbance: Secondary | ICD-10-CM

## 2014-08-23 DIAGNOSIS — I5032 Chronic diastolic (congestive) heart failure: Secondary | ICD-10-CM

## 2014-08-23 DIAGNOSIS — E119 Type 2 diabetes mellitus without complications: Secondary | ICD-10-CM | POA: Diagnosis not present

## 2014-08-23 DIAGNOSIS — R51 Headache: Secondary | ICD-10-CM

## 2014-08-23 DIAGNOSIS — Z79899 Other long term (current) drug therapy: Secondary | ICD-10-CM

## 2014-08-23 DIAGNOSIS — R519 Headache, unspecified: Secondary | ICD-10-CM

## 2014-08-23 LAB — POCT GLYCOSYLATED HEMOGLOBIN (HGB A1C): Hemoglobin A1C: 6.2

## 2014-08-23 LAB — LIPID PANEL
Cholesterol: 225 mg/dL — ABNORMAL HIGH (ref 0–200)
HDL: 41 mg/dL — ABNORMAL LOW (ref >=46)
LDL Cholesterol: 113 mg/dL — ABNORMAL HIGH (ref 0–99)
Total CHOL/HDL Ratio: 5.5 Ratio
Triglycerides: 357 mg/dL — ABNORMAL HIGH (ref <150)
VLDL: 71 mg/dL — ABNORMAL HIGH (ref 0–40)

## 2014-08-23 LAB — BASIC METABOLIC PANEL WITH GFR
BUN: 52 mg/dL — ABNORMAL HIGH (ref 6–23)
CO2: 24 mEq/L (ref 19–32)
Calcium: 10 mg/dL (ref 8.4–10.5)
Chloride: 103 mEq/L (ref 96–112)
Creat: 2.26 mg/dL — ABNORMAL HIGH (ref 0.50–1.10)
GFR, Est African American: 25 mL/min — ABNORMAL LOW
GFR, Est Non African American: 22 mL/min — ABNORMAL LOW
Glucose, Bld: 81 mg/dL (ref 70–99)
Potassium: 4.5 mEq/L (ref 3.5–5.3)
Sodium: 139 mEq/L (ref 135–145)

## 2014-08-23 LAB — GLUCOSE, CAPILLARY: Glucose-Capillary: 88 mg/dL (ref 65–99)

## 2014-08-23 MED ORDER — FUROSEMIDE 40 MG PO TABS: 20.0000 mg | ORAL_TABLET | ORAL | Status: DC

## 2014-08-23 NOTE — Patient Instructions (Addendum)
General Instructions: We will get an MRI of your brain Please cut Lasix to 20 mg MWF and 20 mg when you need it  Please see the eye doctor, Dr. Sung Amabile, and Dr. [name] Stay hydrated with water  Try Tylenol or Ibuprofen for headaches if needed    Treatment Goals:  Goals (1 Years of Data) as of 08/23/14          As of Today 07/31/14 07/03/14 05/17/14 05/17/14     Blood Pressure   . Blood Pressure < 140/90  102/66 116/64 113/71 130/77 164/81      Progress Toward Treatment Goals:  Treatment Goal 08/23/2014  Hemoglobin A1C at goal  Blood pressure at goal    Self Care Goals & Plans:  Self Care Goal 08/23/2014  Manage my medications take my medicines as prescribed; bring my medications to every visit; refill my medications on time; follow the sick day instructions if I am sick  Monitor my health keep track of my blood pressure; keep track of my blood glucose; bring my glucose meter and log to each visit; bring my blood pressure log to each visit; keep track of my weight; check my feet daily  Eat healthy foods drink diet soda or water instead of juice or soda; eat more vegetables; eat foods that are low in salt; eat baked foods instead of fried foods; eat fruit for snacks and desserts; eat smaller portions  Be physically active find an activity I enjoy  Meeting treatment goals maintain the current self-care plan    Home Blood Glucose Monitoring 08/23/2014  Check my blood sugar once a day  When to check my blood sugar before breakfast     Care Management & Community Referrals:  Referral 08/23/2014  Referrals made for care management support none needed  Referrals made to community resources none        Headaches, Frequently Asked Questions MIGRAINE HEADACHES Q: What is migraine? What causes it? How can I treat it? A: Generally, migraine headaches begin as a dull ache. Then they develop into a constant, throbbing, and pulsating pain. You may experience pain at the temples. You may  experience pain at the front or back of one or both sides of the head. The pain is usually accompanied by a combination of:  Nausea.  Vomiting.  Sensitivity to light and noise. Some people (about 15%) experience an aura (see below) before an attack. The cause of migraine is believed to be chemical reactions in the brain. Treatment for migraine may include over-the-counter or prescription medications. It may also include self-help techniques. These include relaxation training and biofeedback.  Q: What is an aura? A: About 15% of people with migraine get an "aura". This is a sign of neurological symptoms that occur before a migraine headache. You may see wavy or jagged lines, dots, or flashing lights. You might experience tunnel vision or blind spots in one or both eyes. The aura can include visual or auditory hallucinations (something imagined). It may include disruptions in smell (such as strange odors), taste or touch. Other symptoms include:  Numbness.  A "pins and needles" sensation.  Difficulty in recalling or speaking the correct word. These neurological events may last as long as 60 minutes. These symptoms will fade as the headache begins. Q: What is a trigger? A: Certain physical or environmental factors can lead to or "trigger" a migraine. These include:  Foods.  Hormonal changes.  Weather.  Stress. It is important to remember that triggers are  different for everyone. To help prevent migraine attacks, you need to figure out which triggers affect you. Keep a headache diary. This is a good way to track triggers. The diary will help you talk to your healthcare professional about your condition. Q: Does weather affect migraines? A: Bright sunshine, hot, humid conditions, and drastic changes in barometric pressure may lead to, or "trigger," a migraine attack in some people. But studies have shown that weather does not act as a trigger for everyone with migraines. Q: What is the link  between migraine and hormones? A: Hormones start and regulate many of your body's functions. Hormones keep your body in balance within a constantly changing environment. The levels of hormones in your body are unbalanced at times. Examples are during menstruation, pregnancy, or menopause. That can lead to a migraine attack. In fact, about three quarters of all women with migraine report that their attacks are related to the menstrual cycle.  Q: Is there an increased risk of stroke for migraine sufferers? A: The likelihood of a migraine attack causing a stroke is very remote. That is not to say that migraine sufferers cannot have a stroke associated with their migraines. In persons under age 11, the most common associated factor for stroke is migraine headache. But over the course of a person's normal life span, the occurrence of migraine headache may actually be associated with a reduced risk of dying from cerebrovascular disease due to stroke.  Q: What are acute medications for migraine? A: Acute medications are used to treat the pain of the headache after it has started. Examples over-the-counter medications, NSAIDs, ergots, and triptans.  Q: What are the triptans? A: Triptans are the newest class of abortive medications. They are specifically targeted to treat migraine. Triptans are vasoconstrictors. They moderate some chemical reactions in the brain. The triptans work on receptors in your brain. Triptans help to restore the balance of a neurotransmitter called serotonin. Fluctuations in levels of serotonin are thought to be a main cause of migraine.  Q: Are over-the-counter medications for migraine effective? A: Over-the-counter, or "OTC," medications may be effective in relieving mild to moderate pain and associated symptoms of migraine. But you should see your caregiver before beginning any treatment regimen for migraine.  Q: What are preventive medications for migraine? A: Preventive medications  for migraine are sometimes referred to as "prophylactic" treatments. They are used to reduce the frequency, severity, and length of migraine attacks. Examples of preventive medications include antiepileptic medications, antidepressants, beta-blockers, calcium channel blockers, and NSAIDs (nonsteroidal anti-inflammatory drugs). Q: Why are anticonvulsants used to treat migraine? A: During the past few years, there has been an increased interest in antiepileptic drugs for the prevention of migraine. They are sometimes referred to as "anticonvulsants". Both epilepsy and migraine may be caused by similar reactions in the brain.  Q: Why are antidepressants used to treat migraine? A: Antidepressants are typically used to treat people with depression. They may reduce migraine frequency by regulating chemical levels, such as serotonin, in the brain.  Q: What alternative therapies are used to treat migraine? A: The term "alternative therapies" is often used to describe treatments considered outside the scope of conventional Western medicine. Examples of alternative therapy include acupuncture, acupressure, and yoga. Another common alternative treatment is herbal therapy. Some herbs are believed to relieve headache pain. Always discuss alternative therapies with your caregiver before proceeding. Some herbal products contain arsenic and other toxins. TENSION HEADACHES Q: What is a tension-type headache? What causes it?  How can I treat it? A: Tension-type headaches occur randomly. They are often the result of temporary stress, anxiety, fatigue, or anger. Symptoms include soreness in your temples, a tightening band-like sensation around your head (a "vice-like" ache). Symptoms can also include a pulling feeling, pressure sensations, and contracting head and neck muscles. The headache begins in your forehead, temples, or the back of your head and neck. Treatment for tension-type headache may include over-the-counter or  prescription medications. Treatment may also include self-help techniques such as relaxation training and biofeedback. CLUSTER HEADACHES Q: What is a cluster headache? What causes it? How can I treat it? A: Cluster headache gets its name because the attacks come in groups. The pain arrives with little, if any, warning. It is usually on one side of the head. A tearing or bloodshot eye and a runny nose on the same side of the headache may also accompany the pain. Cluster headaches are believed to be caused by chemical reactions in the brain. They have been described as the most severe and intense of any headache type. Treatment for cluster headache includes prescription medication and oxygen. SINUS HEADACHES Q: What is a sinus headache? What causes it? How can I treat it? A: When a cavity in the bones of the face and skull (a sinus) becomes inflamed, the inflammation will cause localized pain. This condition is usually the result of an allergic reaction, a tumor, or an infection. If your headache is caused by a sinus blockage, such as an infection, you will probably have a fever. An x-ray will confirm a sinus blockage. Your caregiver's treatment might include antibiotics for the infection, as well as antihistamines or decongestants.  REBOUND HEADACHES Q: What is a rebound headache? What causes it? How can I treat it? A: A pattern of taking acute headache medications too often can lead to a condition known as "rebound headache." A pattern of taking too much headache medication includes taking it more than 2 days per week or in excessive amounts. That means more than the label or a caregiver advises. With rebound headaches, your medications not only stop relieving pain, they actually begin to cause headaches. Doctors treat rebound headache by tapering the medication that is being overused. Sometimes your caregiver will gradually substitute a different type of treatment or medication. Stopping may be a  challenge. Regularly overusing a medication increases the potential for serious side effects. Consult a caregiver if you regularly use headache medications more than 2 days per week or more than the label advises. ADDITIONAL QUESTIONS AND ANSWERS Q: What is biofeedback? A: Biofeedback is a self-help treatment. Biofeedback uses special equipment to monitor your body's involuntary physical responses. Biofeedback monitors:  Breathing.  Pulse.  Heart rate.  Temperature.  Muscle tension.  Brain activity. Biofeedback helps you refine and perfect your relaxation exercises. You learn to control the physical responses that are related to stress. Once the technique has been mastered, you do not need the equipment any more. Q: Are headaches hereditary? A: Four out of five (80%) of people that suffer report a family history of migraine. Scientists are not sure if this is genetic or a family predisposition. Despite the uncertainty, a child has a 50% chance of having migraine if one parent suffers. The child has a 75% chance if both parents suffer.  Q: Can children get headaches? A: By the time they reach high school, most young people have experienced some type of headache. Many safe and effective approaches or medications can prevent  a headache from occurring or stop it after it has begun.  Q: What type of doctor should I see to diagnose and treat my headache? A: Start with your primary caregiver. Discuss his or her experience and approach to headaches. Discuss methods of classification, diagnosis, and treatment. Your caregiver may decide to recommend you to a headache specialist, depending upon your symptoms or other physical conditions. Having diabetes, allergies, etc., may require a more comprehensive and inclusive approach to your headache. The National Headache Foundation will provide, upon request, a list of [facility] physician members in your state. Document Released: 05/30/2003 Document Revised:  06/01/2011 Document Reviewed: 11/07/2007 [facility] Patient Information 2015 [place], [place]. This information is not intended to replace advice given to you by your health care provider. Make sure you discuss any questions you have with your health care provider.

## 2014-08-23 NOTE — Progress Notes (Signed)
Subjective:    Patient ID: [name], female    DOB: [birth year], 64 y.o.   MRN: [id number]  HPI Comments: 64 y.o PMH chronic pain (back), HLD (LDL 113 08/2014), HTN (BP 106/66 today), controlled DM 2, CKD 3, OSA, chronic diastolic heart failure, h/o seasonal allergies, GERD, cough variant asthma, h/o left breast cancer invasive ductal carcinoma stage II a ER/PR positive HER-2 positive status post bilateral mastectomies (in remission) 08/2010 will be on tx with femara until 08/2015, h/o IJ thrombosis (previously on Coumadin now off), ?infarct on 06/2010 MRI brain with repeat 06/2011 negative for infarcts   She presents for f/u  1. DM 2 controlled on Metformin 500 mg qam. 6.2 A1C today 2. CKD 3 with BL Cr on 1.1-1.4. Cr was checked by H/H MD Dr. Lindi Adie on 07/2014 and was 2.2 will repeat today.  Pt denies diarrhea but states she is nauseated w/in the last 2-3 weeks.  She also denies decreased appetite 3. HTN-BP more controlled and slightly low today 102/66 at home she is getting sbp 1 teens.  She is Taking Coreg 12.5 bid, Lasix 40 MWF and 40 prn, Bencar 40, Spironolactone 25 mg qd.  She is not taking Kdur as of now.   4. Chronic leg edema slightly improved today 5. She c/o h/a w/in the last 2-3 weeks occipital radiating to crown of head. H/a is worse with pressing head against headboard and h/a feels different than h/a's she had when she had chemo tx's back in 2012.  H/a's happen in am.  She cant describe how the h/a feels. H/a is not worse with light/sound.  H/a for the last 2-3 weeks have been happening daily or 2x per day.  She denies aura/triggers.  She does not try medication to help h/a.  She has associated nausea (w/o vomiting) and at times dizziness with changing position/suddent movement but denies the room spinning. Orthostatics checked today lying 138/73 pulse 68, sitting 131/78 pulse 75, standing 117/68 pulse 75. She is slightly orthostatic by difference in SBP by 21.  She also reports  since seeing eye MD 03/2014 she has decreased peripheral vision and flashes of light in her eyes.   6.  H/o breast cancer she needs referral to [facility] (Dr. [name]), h/o chronic diastolic HF needs referral to Dr. Tempie Hoist, needs referral to eye MD Dr. Rex Kras   HM-will need pna 23 in 12/2014, has not gotten Zoster vx but was given Rx in past, no role in mammo since b/l masectomies per H/O, check A1C and urine microAlb/Cr today   Daughter Olivia Mackie and grandaughter Mia at visit with patient today     Review of Systems  Eyes: Positive for visual disturbance.  Respiratory: Negative for cough and shortness of breath.   Cardiovascular: Negative for chest pain.  Genitourinary: Negative for dysuria.  Neurological: Positive for dizziness and headaches.       Objective:   Physical Exam  Constitutional: She is oriented to person, place, and time. She appears well-developed and well-nourished. She is cooperative. No distress.  HENT:  Head: Normocephalic and atraumatic.  Mouth/Throat: Oropharynx is clear and moist and mucous membranes are normal. No oropharyngeal exudate.  Eyes: Conjunctivae are normal. Pupils are equal, round, and reactive to light. Right eye exhibits no discharge. Left eye exhibits no discharge. No scleral icterus.  Cardiovascular: Normal rate, regular rhythm, S1 normal, S2 normal and normal heart sounds.   No murmur heard. Trace edema lower legs, nonpitting edema feet b/l   Pulmonary/Chest:  Effort normal and breath sounds normal. No respiratory distress. She has no wheezes.  Abdominal: Soft. Bowel sounds are normal. There is no tenderness.  Neurological: She is alert and oriented to person, place, and time. She has normal strength. No sensory deficit. Gait normal.  CN 2-12 grossly intact  Normal strength and sensation all 4 extremities   Skin: Skin is warm and dry. No rash noted. She is not diaphoretic.  Psychiatric: She has a normal mood and affect. Her speech is normal and  behavior is normal. Judgment and thought content normal. Cognition and memory are normal.  Nursing note and vitals reviewed.         Assessment & Plan:  F/u in 1-1.5 weeks

## 2014-08-24 ENCOUNTER — Encounter: Payer: Self-pay | Admitting: Internal Medicine

## 2014-08-24 DIAGNOSIS — N179 Acute kidney failure, unspecified: Secondary | ICD-10-CM | POA: Insufficient documentation

## 2014-08-24 DIAGNOSIS — R51 Headache: Secondary | ICD-10-CM

## 2014-08-24 DIAGNOSIS — N184 Chronic kidney disease, stage 4 (severe): Principal | ICD-10-CM

## 2014-08-24 DIAGNOSIS — R519 Headache, unspecified: Secondary | ICD-10-CM | POA: Insufficient documentation

## 2014-08-24 LAB — MICROALBUMIN / CREATININE URINE RATIO
Creatinine, Urine: 116.2 mg/dL
Microalb Creat Ratio: 26.7 mg/g (ref 0.0–30.0)
Microalb, Ur: 3.1 mg/dL — ABNORMAL HIGH (ref <2.0)

## 2014-08-24 MED ORDER — FUROSEMIDE 40 MG PO TABS: 20.0000 mg | ORAL_TABLET | ORAL | Status: DC

## 2014-08-24 MED ORDER — ONDANSETRON HCL 4 MG PO TABS: 4.0000 mg | ORAL_TABLET | Freq: Three times a day (TID) | ORAL | Status: DC | PRN

## 2014-08-24 NOTE — Assessment & Plan Note (Signed)
Lipid Panel     Component Value Date/Time   CHOL 225* 08/23/2014 1505   TRIG 357* 08/23/2014 1505   HDL 41* 08/23/2014 1505   CHOLHDL 5.5 08/23/2014 1505   VLDL 71* 08/23/2014 1505   LDLCALC 113* 08/23/2014 1505   Increase in LDL disc'ed diet and exercise modifications  For now continue Zocor 20 qd, if needed can increase to 40 mg in the future

## 2014-08-24 NOTE — Assessment & Plan Note (Signed)
Will f/u with H/O in 2017 Dr. Lindi Adie in remission  No need for mammo due to h/o masectomy  Will refer back to Dr. [name] to f/u

## 2014-08-24 NOTE — Assessment & Plan Note (Addendum)
Lab Results  Component Value Date   HGBA1C 6.2 08/23/2014   HGBA1C 6.4 04/26/2014   HGBA1C 5.9 01/18/2014     Assessment: Diabetes control: good control (HgbA1C at goal) Progress toward A1C goal:  at goal Comments: A1C stable  Plan: Medications:  continue current medications (Metformin 500 mg qam) Home glucose monitoring: Frequency: once a day Timing: before breakfast Instruction/counseling given: no instruction/counseling  Other plans: f/u in 3 months, check urine MicroAlb/Cr today +microAlbinuria will need repeat urine in 3-6 months to f/u

## 2014-08-24 NOTE — Assessment & Plan Note (Signed)
Etiology may be related to tension vs migraine. Also pt was orthostatic in clinic. H/a's associated with nausea and vision changes (flashes of light and decreased peripheral vision). H/a and Vision changes may be related to + orthostatic hypotension Due to age >67 and vision changes will order MRI w/o contrast (hold MRI w contrast to avoid nephrogenic systemic sclerosis with now CKD 4) Holding BP meds for now in setting of AKI on CKD (Lasix, Benicar, Spironlactone) Will also refer back to opthalmologist Dr. Rex Kras [facility] for flashes of light and decreased peripheral vision Prn Zofran for nausea

## 2014-08-24 NOTE — Assessment & Plan Note (Signed)
BP Readings from Last 3 Encounters:  08/23/14 102/66  07/31/14 116/64  05/17/14 130/77    Lab Results  Component Value Date   NA 139 08/23/2014   K 4.5 08/23/2014   CREATININE 2.26* 08/23/2014    Assessment: Blood pressure control: controlled Progress toward BP goal:  at goal Comments: BP soft today  Plan: Medications:  BP meds Lasix 40 decreased to 20 mg MWF, prn, Benicar 40, Spironolactone 25 mg qd, Coreg 12.5 mg bid.  After getting labs back with AKI on CKD 3 progressed to 4 advised pt to hold Lasix 20, Benicar 40, Spironolactone 25 for now [place] (not [place]). Can continue Coreg 12.5 mg bid Other plans: f/u in 1-1.5 weeks BMET and check BP

## 2014-08-24 NOTE — Assessment & Plan Note (Signed)
Referred back to Dr. Sung Amabile for f/u insurance requires f/u

## 2014-08-24 NOTE — Assessment & Plan Note (Signed)
will need pna 23 in 12/2014, has not gotten Zoster vx but was given Rx in past, no role in mammo since b/l masectomies per H/O, check A1C and urine microAlb/Cr today

## 2014-08-24 NOTE — Assessment & Plan Note (Addendum)
BL Cr. 1.1-1.4  Decline in CKD 3 to 4 now with AKI. ?etiology Patient does not appear volume depleted BP is on lower end of normal, no evidence of sepsis/dysuria to indicate possibly GU source at this time Repeat BMET with Cr still 2.26 like noted in 07/2014  Advised to stop Lasix, Benicar, K, Spironolactone  Will repeat BMET in 1-1.5 weeks if still elevated consider stopping PPI as well and consider AIN 2/2 PPI and consider check urine for eosinophils, also UA, urine lytes at f/u  If CKD continues to decline and not improve stay CKD 4 will refer to renal outpatient She is having nausea which may be related with Rx Zofran 4 mg prn  Advised to stay hydrated

## 2014-08-26 NOTE — Progress Notes (Signed)
Medicine attending: Medical history, presenting problems, physical findings, and medications, reviewed with Dr Karlyn Agee on the day of the patient visit and I concur with her evaluation and management plan.

## 2014-08-28 ENCOUNTER — Other Ambulatory Visit: Payer: Self-pay | Admitting: *Deleted

## 2014-08-28 DIAGNOSIS — R110 Nausea: Secondary | ICD-10-CM

## 2014-08-28 NOTE — Telephone Encounter (Signed)
Filled 08/24/14 by PCP

## 2014-09-04 ENCOUNTER — Encounter: Payer: Self-pay | Admitting: Internal Medicine

## 2014-09-04 ENCOUNTER — Ambulatory Visit (INDEPENDENT_AMBULATORY_CARE_PROVIDER_SITE_OTHER): Payer: Commercial Managed Care - HMO | Admitting: Internal Medicine

## 2014-09-04 VITALS — BP 181/90 | HR 78 | Temp 98.2°F | Ht 68.0 in | Wt 229.9 lb

## 2014-09-04 DIAGNOSIS — N179 Acute kidney failure, unspecified: Secondary | ICD-10-CM | POA: Diagnosis not present

## 2014-09-04 DIAGNOSIS — I129 Hypertensive chronic kidney disease with stage 1 through stage 4 chronic kidney disease, or unspecified chronic kidney disease: Secondary | ICD-10-CM

## 2014-09-04 DIAGNOSIS — N184 Chronic kidney disease, stage 4 (severe): Secondary | ICD-10-CM | POA: Diagnosis not present

## 2014-09-04 DIAGNOSIS — I10 Essential (primary) hypertension: Secondary | ICD-10-CM

## 2014-09-04 LAB — BASIC METABOLIC PANEL
Anion gap: 6 (ref 5–15)
BUN: 16 mg/dL (ref 6–20)
CO2: 28 mmol/L (ref 22–32)
Calcium: 9.3 mg/dL (ref 8.9–10.3)
Chloride: 106 mmol/L (ref 101–111)
Creatinine, Ser: 1.44 mg/dL — ABNORMAL HIGH (ref 0.44–1.00)
GFR calc Af Amer: 43 mL/min — ABNORMAL LOW (ref >60)
GFR calc non Af Amer: 37 mL/min — ABNORMAL LOW (ref >60)
Glucose, Bld: 101 mg/dL — ABNORMAL HIGH (ref 65–99)
Potassium: 4.3 mmol/L (ref 3.5–5.1)
Sodium: 140 mmol/L (ref 135–145)

## 2014-09-04 LAB — URINALYSIS, ROUTINE W REFLEX MICROSCOPIC
Bilirubin Urine: NEGATIVE
Glucose, UA: NEGATIVE mg/dL
Ketones, ur: NEGATIVE mg/dL
Nitrite: NEGATIVE
Protein, ur: NEGATIVE mg/dL
Specific Gravity, Urine: 1.011 (ref 1.005–1.030)
Urobilinogen, UA: 0.2 mg/dL (ref 0.0–1.0)
pH: 5.5 (ref 5.0–8.0)

## 2014-09-04 LAB — URINE MICROSCOPIC-ADD ON

## 2014-09-04 MED ORDER — CARVEDILOL 12.5 MG PO TABS: 25.0000 mg | ORAL_TABLET | Freq: Two times a day (BID) | ORAL | Status: DC

## 2014-09-04 NOTE — Patient Instructions (Signed)
1. Please schedule a follow up appointment for 4 weeks.   2. Please take all medications as previously prescribed with the following changes:  I WILL CALL YOU THIS AFTERNOON WITH YOUR LAB RESULTS. At that time we will discuss the changes to be made to your medications.  -If your kidney function is back to your baseline, we will restart your Spironolactone and Benicar. -If your kidney function is the same as before, we will increase your Coreg, start a new medicine called Norvasc 5 mg daily, discontinue your Metformin for now, and I will refer you to the kidney specialist.   3. If you have worsening of your symptoms or new symptoms arise, please call the clinic [phone]), or go to the ER immediately if symptoms are severe.  You have done a great job in taking all your medications. Please continue to do this.

## 2014-09-04 NOTE — Addendum Note (Signed)
Addended byCorky Sox on: 09/04/2014 02:50 PM   Modules accepted: Level of Service

## 2014-09-04 NOTE — Progress Notes (Signed)
Internal Medicine Clinic Attending  Case discussed with Dr. Jones soon after the resident saw the patient.  We reviewed the resident's history and exam and pertinent patient test results.  I agree with the assessment, diagnosis, and plan of care documented in the resident's note. 

## 2014-09-04 NOTE — Progress Notes (Signed)
Subjective:   Patient ID: [name] female   DOB: 1950-11-04 64 y.o.   MRN: [id number]  HPI: [name] is a 64 y.o. female w/ PMHx of HTN, HLD, Asthma, GERD, h/o Breast CA in remission (now on Femara), mild chronic dCHF, and CKD, presents to the clinic today for a follow-up visit regarding worsening renal function. Patient was seen on 08/23/14 at which time her Cr was 2.26, increased from her normal baseline which is closer to 1.3-1.4. At that visit her Spironolactone, Lasix, and Benicar were held given her change in renal function. Patient has a known h/o bilateral simple renal cysts as seen on renal US on 05/28/14 (also identified previously), which also revealed normal kidney size and no other significant abnormalities, however, this was also performed prior to her change in renal function. Patient denies any recent NSAID use, significant dehydration, fever, chills, antibiotic administration, or change in urinary habits. She denies dark urine, flank pain, dysuria, increased frequency, oliguria, or urgency.   BP elevated today, SBP in the 180's.  Current Outpatient Prescriptions  Medication Sig Dispense Refill  . albuterol (PROVENTIL HFA;VENTOLIN HFA) 108 (90 BASE) MCG/ACT inhaler Inhale 1-2 puffs into the lungs every 6 (six) hours as needed for wheezing or shortness of breath. 18 g 2  . aspirin 81 MG tablet Take 81 mg by mouth daily.    . Blood Pressure KIT 1 Device by Does not apply route daily. 1 each 0  . carvedilol (COREG) 12.5 MG tablet Take 1 tablet (12.5 mg total) by mouth 2 (two) times daily with a meal. 180 tablet 0  . cholecalciferol (VITAMIN D) 1000 UNITS tablet Take 1,000 Units by mouth daily.    . ferrous sulfate 325 (65 FE) MG tablet Take 1 tablet (325 mg total) by mouth 3 (three) times daily with meals. 90 tablet 6  . Fluticasone-Salmeterol (ADVAIR) 100-50 MCG/DOSE AEPB Inhale 1 puff into the lungs 2 (two) times daily as needed (wheezing). 180 each 3  .  furosemide (LASIX) 40 MG tablet Take 0.5 tablets (20 mg total) by mouth every Monday, Wednesday, and Friday. 90 tablet 0  . letrozole (FEMARA) 2.5 MG tablet TAKE 1 TABLET (2.5 MG TOTAL) BY MOUTH DAILY. 90 tablet 1  . loratadine (CLARITIN) 10 MG tablet Take 1 tablet (10 mg total) by mouth daily. 30 tablet 2  . LORazepam (ATIVAN) 1 MG tablet Take 1 tablet (1 mg total) by mouth daily as needed. for anxiety (Patient not taking: Reported on 08/24/2014) 90 tablet 3  . metFORMIN (GLUCOPHAGE) 500 MG tablet Take 1 tablet (500 mg total) by mouth daily with breakfast. 90 tablet 3  . Multiple Vitamin (MULTIVITAMIN) tablet Take 1 tablet by mouth daily.     Marland Kitchen olmesartan (BENICAR) 40 MG tablet Take 1 tablet (40 mg total) by mouth daily. 90 tablet 3  . ondansetron (ZOFRAN) 4 MG tablet Take 1 tablet (4 mg total) by mouth every 8 (eight) hours as needed for nausea or vomiting. 20 tablet 0  . pantoprazole (PROTONIX) 40 MG tablet Take 1 tablet (40 mg total) by mouth daily. 90 tablet 3  . potassium chloride SA (K-DUR,KLOR-CON) 10 MEQ tablet Take 1 tablet (10 mEq total) by mouth daily. When you take Lasix 30 tablet 3  . PRODIGY NO CODING BLOOD GLUC test strip     . Simethicone 125 MG CAPS Take 1 capsule (125 mg total) by mouth 4 (four) times daily as needed (gas, bloating). 90 each 2  .  simvastatin (ZOCOR) 20 MG tablet TAKE 1 TABLET (20 MG TOTAL) BY MOUTH AT BEDTIME. 90 tablet 3  . sodium chloride (OCEAN) 0.65 % SOLN nasal spray Place 1 spray into both nostrils 4 (four) times daily. (Patient not taking: Reported on 08/24/2014) 2 Bottle 0  . spironolactone (ALDACTONE) 25 MG tablet Take 1 tablet (25 mg total) by mouth daily. 90 tablet 3  . traMADol (ULTRAM) 50 MG tablet Take 1 tablet (50 mg total) by mouth every 6 (six) hours as needed. For pain (Patient not taking: Reported on 08/24/2014) 90 tablet 5  . triamcinolone cream (KENALOG) 0.1 % Apply topically 2 (two) times daily as needed. 454 g 0  . venlafaxine (EFFEXOR) 37.5 MG  tablet Take 1 tablet (37.5 mg total) by mouth daily. 90 tablet 1   No current facility-administered medications for this visit.   Facility-Administered Medications Ordered in Other Visits  Medication Dose Route Frequency Provider Last Rate Last Dose  . sodium chloride 0.9 % injection 10 mL  10 mL Intravenous PRN Consuela Mimes, MD   10 mL at 01/26/11 1035    Review of Systems:  General: Denies fever, diaphoresis, appetite change, and fatigue.  Respiratory: Denies SOB, cough, and wheezing.   Cardiovascular: Denies chest pain and palpitations.  Gastrointestinal: Denies nausea, vomiting, abdominal pain, and diarrhea Musculoskeletal: Denies myalgias, arthralgias, back pain, and gait problem.  Neurological: Denies dizziness, syncope, weakness, lightheadedness, and headaches.  Psychiatric/Behavioral: Denies mood changes, sleep disturbance, and agitation.   Objective:   Physical Exam: Filed Vitals:   09/04/14 1121  BP: 181/90  Pulse: 78  Temp: 98.2 F (36.8 C)  TempSrc: Oral  Height: $Remove'5\' 8"'aReRzZD$  (1.727 m)  Weight: 229 lb 14.4 oz (104.282 kg)  SpO2: 100%    General: Very pleasant AA female, alert, cooperative, NAD HEENT: PERRL, EOMI. Moist mucus membranes Neck: Full range of motion without pain, supple, no lymphadenopathy or carotid bruits Lungs: Clear to ascultation bilaterally, normal work of respiration, no wheezes, rales, rhonchi Heart: RRR, no murmurs, gallops, or rubs Abdomen: Soft, non-tender, non-distended, BS + Extremities: No cyanosis or clubbing. +1/+2, non-pitting edema extending to mid calf, most prominent over the dorsum of the foot (patient claims this is her baseline). Non tender.  Neurologic: Alert & oriented x3, cranial nerves II-XII intact, strength grossly intact, sensation intact to light touch   Assessment & Plan:   Please see problem based assessment and plan.

## 2014-09-04 NOTE — Assessment & Plan Note (Addendum)
Cr increased at last visit to 2.26 w/ BUN of 52, baseline Cr closer to 1.3. Most likely related to prerenal AKI d/t over diuresis and low BP. Benicar, Lasix, and Spironolactone held at her last visit. Today, patient says she has had no symptoms suggestive of hypovolemia; she denies dizziness, lightheadedness, palpitations, or SOB. BP elevated today, SBP in the 180's. Cr improved to 1.44, not quite back to baseline. BUN 16. UA with no protein, did show too numerous WBC's and large leuks. No dysuria, flank pain, increased frequency or urgency. -Given elevated BP, will increase Coreg to 25 mg bid for now and add back Spironolactone (K 4.3). Will likely still require more BP control at next clinic visit.  -Continue to hold Benicar and Lasix for now. Can consider restarting at next visit at decreased dosage given her AKI most likely related to hypovolemia/hypotension.  -RTC in 3-4 weeks w/ new PCP Dr. Marvel Plan -Check BMP at next visit w/ close attention to Cr and K -Continue Metformin 500 mg daily given GFR of 43, can continue at this low dose for now. Recheck BMP in 3 weeks as above

## 2014-09-04 NOTE — Assessment & Plan Note (Signed)
BP Readings from Last 3 Encounters:  09/04/14 181/90  08/23/14 102/66  07/31/14 116/64    Lab Results  Component Value Date   NA 140 09/04/2014   K 4.3 09/04/2014   CREATININE 1.44* 09/04/2014    Assessment: Blood pressure control:  Elevated Comments: Pateint's Benicar, Lasix, and Spironolactone held at last visit 2/2 AKI on CKD. BP elevated today, Cr improved.   Plan: Medications:  Increase Coreg to 25 mg bid, add back Spironolactone 25 mg daily. I think adding back ARB may detrimental to kidney recovery at this point in time, may need to delay, possibly add back at decreased dose.  Educational resources provided: brochure (denies) Self management tools provided:   Other plans: RTC in 3 weeks, recheck BMP at that time.

## 2014-09-05 ENCOUNTER — Encounter: Payer: Self-pay | Admitting: Internal Medicine

## 2014-09-05 ENCOUNTER — Telehealth: Payer: Self-pay | Admitting: *Deleted

## 2014-09-05 NOTE — Telephone Encounter (Signed)
JUST A REMINDER  CALL / PATIENT WAS CONTACTED BY PHONE OF THIS APPOINTMENT WITH [facility] / June 24.016 @ 11:30AM.

## 2014-09-11 ENCOUNTER — Other Ambulatory Visit: Payer: Self-pay | Admitting: Internal Medicine

## 2014-09-11 ENCOUNTER — Telehealth: Payer: Self-pay | Admitting: *Deleted

## 2014-09-11 DIAGNOSIS — I10 Essential (primary) hypertension: Secondary | ICD-10-CM

## 2014-09-11 MED ORDER — OLMESARTAN MEDOXOMIL 20 MG PO TABS
20.0000 mg | ORAL_TABLET | Freq: Every day | ORAL | Status: DC
Start: 2014-09-11 — End: 2014-10-05

## 2014-09-11 NOTE — Telephone Encounter (Signed)
Pt called clinic and stated BP was going up - ask for reading on BP - never gave me any numbers. Offered an appt in clinic to check BP - daughter got on phone to talk. Left message for Dr Ronnald Ramp to call pt regarding BP. Pt aware. Hilda Blades Havard Radigan RN 09/11/14 10:30AM

## 2014-09-11 NOTE — Progress Notes (Signed)
Patient called concerned that her BP was elevated in the [id number] systolic. Recent clinic visit, Benicar, Spironolactone, and Lasix were held in the setting of impaired renal function. Patient was then seen again, Cr shown to improve close to her baseline to 1.44. Started back on Spironolactone at that time.  -Given increased BP at home and patient concern, will restart lower dose Benicar (was on 40 mg daily) 20 mg daily in addition to her Spironolactone and Coreg which she was already taking.  -Patient will return in 1-2 weeks to have BP rechecked and follow up BMP.  -For future, unclear as to why patient is on Spironolactone to begin with, no evidence of ascites, severe sCHF, or hyperaldosteronism. Does not appear that the patient has had issues w/ hypokalemia in the past either. May consider discontinuing Spironolactone completely in the future.   Natasha Bence, MD PGY-2, Internal Medicine Pager: [phone]

## 2014-09-14 DIAGNOSIS — L723 Sebaceous cyst: Secondary | ICD-10-CM | POA: Diagnosis not present

## 2014-09-14 DIAGNOSIS — R229 Localized swelling, mass and lump, unspecified: Secondary | ICD-10-CM | POA: Diagnosis not present

## 2014-09-14 DIAGNOSIS — C50912 Malignant neoplasm of unspecified site of left female breast: Secondary | ICD-10-CM | POA: Diagnosis not present

## 2014-09-20 ENCOUNTER — Telehealth: Payer: Self-pay | Admitting: *Deleted

## 2014-09-20 NOTE — Telephone Encounter (Signed)
CALLED PATIENT. LVM FOR PATIENT TO CALL [facility] IF SHE HAD ANY QUESTIONS, HER MRI BRAIN SCHEDULED 7-11-016. IF NEEDED TO CHANGE THIS APPOINTMENT SHE IS TO CALL [phone]. AND THAT I AM WORKING ON HER REFERRAL FOR CARDIOLOGY.

## 2014-09-21 ENCOUNTER — Telehealth: Payer: Self-pay | Admitting: *Deleted

## 2014-09-21 NOTE — Telephone Encounter (Signed)
CALLED PATIENT LEFT VOICE MESSAGE FOR PATIENT ON HER HOME PHONE AND CELL PHONE/ TO REMIND HER OF HER MRI AT [facility] ON 7-11-016 @ 9:30 AM FOR A 10:00AM APPOINTMENT. SHE IS TO GO THE [facility]. IF UNABLE TO KEEP THIS APPOINTMENT TO [facility]. @ [phone], OR CALL THE MAIN # @ [phone] AND ASK FOR [facility] DEPT.

## 2014-09-26 ENCOUNTER — Ambulatory Visit (INDEPENDENT_AMBULATORY_CARE_PROVIDER_SITE_OTHER): Payer: Commercial Managed Care - HMO | Admitting: Internal Medicine

## 2014-09-26 ENCOUNTER — Encounter: Payer: Self-pay | Admitting: Internal Medicine

## 2014-09-26 VITALS — BP 174/88 | HR 73 | Temp 98.2°F | Ht 68.0 in

## 2014-09-26 DIAGNOSIS — R519 Headache, unspecified: Secondary | ICD-10-CM

## 2014-09-26 DIAGNOSIS — I5032 Chronic diastolic (congestive) heart failure: Secondary | ICD-10-CM

## 2014-09-26 DIAGNOSIS — N183 Chronic kidney disease, stage 3 unspecified: Secondary | ICD-10-CM

## 2014-09-26 DIAGNOSIS — E1122 Type 2 diabetes mellitus with diabetic chronic kidney disease: Secondary | ICD-10-CM | POA: Diagnosis not present

## 2014-09-26 DIAGNOSIS — Z0000 Encounter for general adult medical examination without abnormal findings: Secondary | ICD-10-CM

## 2014-09-26 DIAGNOSIS — I10 Essential (primary) hypertension: Secondary | ICD-10-CM | POA: Diagnosis not present

## 2014-09-26 DIAGNOSIS — I130 Hypertensive heart and chronic kidney disease with heart failure and stage 1 through stage 4 chronic kidney disease, or unspecified chronic kidney disease: Secondary | ICD-10-CM | POA: Diagnosis not present

## 2014-09-26 DIAGNOSIS — R51 Headache: Secondary | ICD-10-CM

## 2014-09-26 DIAGNOSIS — E1165 Type 2 diabetes mellitus with hyperglycemia: Secondary | ICD-10-CM

## 2014-09-26 MED ORDER — HYDRALAZINE HCL 10 MG PO TABS: 10.0000 mg | ORAL_TABLET | Freq: Three times a day (TID) | ORAL | Status: DC

## 2014-09-26 NOTE — Assessment & Plan Note (Addendum)
BP Readings from Last 3 Encounters:  09/26/14 174/88  09/04/14 181/90  08/23/14 102/66    Lab Results  Component Value Date   NA 140 09/04/2014   K 4.3 09/04/2014   CREATININE 1.44* 09/04/2014    Assessment: Blood pressure control:  Uncontrolled Progress toward BP goal:   Deteriorated Comments: Patient is on Coreg 25 mg BID, Benicar 20 mg daily and Spironolactone 25 mg daily. Her benicar was reduced and lasix was held due to hypotension and acute on chronic kidney disease.   Plan: Medications: Given that patient is still uncontrolled, I recommend patient start Hydralazine 10 mg TID. Unfortunately, she is allergic to amlodipine and HCTZ. She is maxed out on coreg and I am hesitant to increase her Benicar or spironolactone or restart her lasix due to kidney disease.  Educational resources provided: brochure, handout, video Other plans: Return in one month, recheck BMET

## 2014-09-26 NOTE — Assessment & Plan Note (Signed)
Creatine improved from 2.26 to 1.44 after cessation of lasix and benicar. Benicar has been added back at 20 mg daily. Baseline creatinine 1.1-1.4.  Plan: -Recheck BMET

## 2014-09-26 NOTE — Assessment & Plan Note (Signed)
Lab Results  Component Value Date   HGBA1C 6.2 08/23/2014   HGBA1C 6.4 04/26/2014   HGBA1C 5.9 01/18/2014     Assessment: Diabetes control:  Controlled Progress toward A1C goal:   At goal Comments: Compliant with metformin 500 mg daily and benicar 20 mg daily  Plan: Medications:  continue current medications Other plans: Repeat HgbA1c in 3 months

## 2014-09-26 NOTE — Assessment & Plan Note (Addendum)
2D echo in 2014 revealed EF of 60-65% and grade 1 diastolic dysfunction. Patient is on coreg 25 mg BID, ASA 81 mg daily, benicar 20 mg daily and was previously on lasix 40 mg MWF. Since the cessation of her lasix, she has not developed any CHF symptoms. No dyspnea or lower extremity edema. No crackles or edema on examination.  Plan: -Continue to monitor

## 2014-09-26 NOTE — Progress Notes (Signed)
Subjective:    Patient ID: [name], female    DOB: 11-Dec-1950, 64 y.o.   MRN: [id number]  HPI [name] is a a 64 yo female with PMHx of HTN, HLD, GERD, Asthma, h/o breast cancer on femara, and CKD III who presents for follow up for HTN and CKD. Please see problem oriented charting for more information.  Review of Systems General: Denies fever, chills, fatigue Respiratory: Denies SOB, cough, DOE.   Cardiovascular: Denies chest pain and palpitations.  Gastrointestinal: Denies diarrhea, constipation, blood in stool  Neurological: Denies dizziness, headaches, weakness, lightheadedness, change in vision.  Past Medical History  Diagnosis Date  . Depression     seasonal melancholia  . Hyperlipidemia   . Hypertension   . Myopathy     not statin related, cause unknown   . Back pain   . Edema extremities   . Chemotherapy follow-up examination 03/06/2011  . Cough 03/06/2011  . Arthritis     DJD, lumbar spondylosis  . Bronchitis 03/10/2012  . Asthma   . GERD (gastroesophageal reflux disease)   . Cataract   . Breast cancer     left breast invasive ductal ca in remission as of 10/07/11 s/p double mastectomy  . E. coli UTI   . Ileitis   . Kidney stones     non obstructive   . Bilateral renal cysts   . Colon polyps    Outpatient Encounter Prescriptions as of 09/26/2014  Medication Sig Note  . albuterol (PROVENTIL HFA;VENTOLIN HFA) 108 (90 BASE) MCG/ACT inhaler Inhale 1-2 puffs into the lungs every 6 (six) hours as needed for wheezing or shortness of breath.   Marland Kitchen aspirin 81 MG tablet Take 81 mg by mouth daily.   . Blood Pressure KIT 1 Device by Does not apply route daily.   . carvedilol (COREG) 12.5 MG tablet Take 2 tablets (25 mg total) by mouth 2 (two) times daily with a meal.   . cholecalciferol (VITAMIN D) 1000 UNITS tablet Take 1,000 Units by mouth daily.   . ferrous sulfate 325 (65 FE) MG tablet Take 1 tablet (325 mg total) by mouth 3 (three) times daily with meals.     . Fluticasone-Salmeterol (ADVAIR) 100-50 MCG/DOSE AEPB Inhale 1 puff into the lungs 2 (two) times daily as needed (wheezing).   . hydrALAZINE (APRESOLINE) 10 MG tablet Take 1 tablet (10 mg total) by mouth 3 (three) times daily.   Marland Kitchen letrozole (FEMARA) 2.5 MG tablet TAKE 1 TABLET (2.5 MG TOTAL) BY MOUTH DAILY. 08/24/2014: Will take until 2017   . loratadine (CLARITIN) 10 MG tablet Take 1 tablet (10 mg total) by mouth daily.   Marland Kitchen LORazepam (ATIVAN) 1 MG tablet Take 1 tablet (1 mg total) by mouth daily as needed. for anxiety (Patient not taking: Reported on 08/24/2014)   . metFORMIN (GLUCOPHAGE) 500 MG tablet Take 1 tablet (500 mg total) by mouth daily with breakfast.   . Multiple Vitamin (MULTIVITAMIN) tablet Take 1 tablet by mouth daily.    Marland Kitchen olmesartan (BENICAR) 20 MG tablet Take 1 tablet (20 mg total) by mouth daily.   . ondansetron (ZOFRAN) 4 MG tablet Take 1 tablet (4 mg total) by mouth every 8 (eight) hours as needed for nausea or vomiting.   . pantoprazole (PROTONIX) 40 MG tablet Take 1 tablet (40 mg total) by mouth daily.   . potassium chloride SA (K-DUR,KLOR-CON) 10 MEQ tablet Take 1 tablet (10 mEq total) by mouth daily. When you take Lasix   .  PRODIGY NO CODING BLOOD GLUC test strip  07/31/2014: Received from: External Pharmacy  . Simethicone 125 MG CAPS Take 1 capsule (125 mg total) by mouth 4 (four) times daily as needed (gas, bloating).   . simvastatin (ZOCOR) 20 MG tablet TAKE 1 TABLET (20 MG TOTAL) BY MOUTH AT BEDTIME.   . sodium chloride (OCEAN) 0.65 % SOLN nasal spray Place 1 spray into both nostrils 4 (four) times daily. (Patient not taking: Reported on 08/24/2014)   . spironolactone (ALDACTONE) 25 MG tablet Take 1 tablet (25 mg total) by mouth daily.   . traMADol (ULTRAM) 50 MG tablet Take 1 tablet (50 mg total) by mouth every 6 (six) hours as needed. For pain (Patient not taking: Reported on 08/24/2014)   . triamcinolone cream (KENALOG) 0.1 % Apply topically 2 (two) times daily as needed.    . venlafaxine (EFFEXOR) 37.5 MG tablet Take 1 tablet (37.5 mg total) by mouth daily.   . [DISCONTINUED] furosemide (LASIX) 40 MG tablet Take 0.5 tablets (20 mg total) by mouth every Monday, Wednesday, and Friday. 08/24/2014: Was taking 40 MWF and 40 prn instead of 20 mg prn   Facility-Administered Encounter Medications as of 09/26/2014  Medication  . sodium chloride 0.9 % injection 10 mL      Objective:   Physical Exam Filed Vitals:   09/26/14 1333  BP: 174/88  Pulse: 73  Temp: 98.2 F (36.8 C)  TempSrc: Oral  Height: $Remove'5\' 8"'MAqdNXD$  (1.727 m)  SpO2: 100%   General: Vital signs reviewed.  Patient is well-developed and well-nourished, in no acute distress and cooperative with exam.   Cardiovascular: RRR, S1 normal, S2 normal, no murmurs, gallops, or rubs. Pulmonary/Chest: Clear to auscultation bilaterally, no wheezes, rales, or rhonchi. Abdominal: Soft, non-tender, non-distended, BS + Extremities: No lower extremity edema bilaterally, pulses symmetric and intact bilaterally.  Psychiatric: Normal mood and affect. speech and behavior is normal. Cognition and memory are normal.      Assessment & Plan:   Please see problem oriented charting for assessment and plan.

## 2014-09-26 NOTE — Assessment & Plan Note (Signed)
Colonoscopy due 10/2016 Mammogram not recommend after bilateral mastectomy. Annual physical exams and monthly self exams recommended.  DEXA Normal 2015

## 2014-09-26 NOTE — Assessment & Plan Note (Signed)
Patient states headache and vision changes have resolved. Vision changes were likely related to orthostatic hypotension as previously mentioned which has now resolved with adjustment of medications. Patient is still planned for MRI on 7/11.  Plan: -Continue with MRI

## 2014-09-26 NOTE — Patient Instructions (Signed)
General Instructions:   Please bring your medicines with you each time you come to clinic.  Medicines may include prescription medications, over-the-counter medications, herbal remedies, eye drops, vitamins, or other pills.    TAKE HYDRALAZINE 10 MG THREE TIMES A DAY CONTINUE TAKING BENICAR 20 MG DAILY, SPIRONOLACTONE 25 MG DAILY, AND COREG 25 MG TWICE A DAY.  RETURN IN ONE MONTH FOR FOLLOW UP   Chronic Kidney Disease Chronic kidney disease occurs when the kidneys are damaged over a long period. The kidneys are two organs that lie on either side of the spine between the middle of the back and the front of the abdomen. The kidneys:   Remove wastes and extra water from the blood.   Produce important hormones. These help keep bones strong, regulate blood pressure, and help create red blood cells.   Balance the fluids and chemicals in the blood and tissues. A small amount of kidney damage may not cause problems, but a large amount of damage may make it difficult or impossible for the kidneys to work the way they should. If steps are not taken to slow down the kidney damage or stop it from getting worse, the kidneys may stop working permanently. Most of the time, chronic kidney disease does not go away. However, it can often be controlled, and those with the disease can usually live normal lives. CAUSES  The most common causes of chronic kidney disease are diabetes and high blood pressure (hypertension). Chronic kidney disease may also be caused by:   Diseases that cause the kidneys' filters to become inflamed.   Diseases that affect the immune system.   Genetic diseases.   Medicines that damage the kidneys, such as anti-inflammatory medicines.  Poisoning or exposure to toxic substances.   A reoccurring kidney or urinary infection.   A problem with urine flow. This may be caused by:   Cancer.   Kidney stones.   An enlarged prostate in males. SIGNS AND SYMPTOMS   Because the kidney damage in chronic kidney disease occurs slowly, symptoms develop slowly and may not be obvious until the kidney damage becomes severe. A person may have a kidney disease for years without showing any symptoms. Symptoms can include:   Swelling (edema) of the legs, ankles, or feet.   Tiredness (lethargy).   Nausea or vomiting.   Confusion.   Problems with urination, such as:   Decreased urine production.   Frequent urination, especially at night.   Frequent accidents in children who are potty trained.   Muscle twitches and cramps.   Shortness of breath.  Weakness.   Persistent itchiness.   Loss of appetite.  Metallic taste in the mouth.  Trouble sleeping.  Slowed development in children.  Short stature in children. DIAGNOSIS  Chronic kidney disease may be detected and diagnosed by tests, including blood, urine, imaging, or kidney biopsy tests.  TREATMENT  Most chronic kidney diseases cannot be cured. Treatment usually involves relieving symptoms and preventing or slowing the progression of the disease. Treatment may include:   A special diet. You may need to avoid alcohol and foods thatare salty and high in potassium.   Medicines. These may:   Lower blood pressure.   Relieve anemia.   Relieve swelling.   Protect the bones. HOME CARE INSTRUCTIONS   Follow your prescribed diet.   Take medicines only as directed by your health care provider. Do not take any new medicines (prescription, over-the-counter, or nutritional supplements) unless approved by your health care provider.  Many medicines can worsen your kidney damage or need to have the dose adjusted.   Quit smoking if you smoke. Talk to your health care provider about a smoking cessation program.   Keep all follow-up visits as directed by your health care provider. SEEK IMMEDIATE MEDICAL CARE IF:  Your symptoms get worse or you develop new symptoms.   You  develop symptoms of end-stage kidney disease. These include:   Headaches.   Abnormally dark or light skin.   Numbness in the hands or feet.   Easy bruising.   Frequent hiccups.   Menstruation stops.   You have a fever.   You have decreased urine production.   You havepain or bleeding when urinating. MAKE SURE YOU:  Understand these instructions.  Will watch your condition.  Will get help right away if you are not doing well or get worse. FOR MORE INFORMATION   American Association of Kidney Patients: BombTimer.gl  National Kidney Foundation: www.kidney.[place]: https://mathis.com/  [facility]: www.lifeoptions.org and www.kidneyschool.org Document Released: 12/17/2007 Document Revised: 07/24/2013 Document Reviewed: 11/06/2011 [facility] Patient Information 2015 [place], [place]. This information is not intended to replace advice given to you by your health care provider. Make sure you discuss any questions you have with your health care provider.

## 2014-09-27 ENCOUNTER — Telehealth: Payer: Self-pay | Admitting: Internal Medicine

## 2014-09-27 LAB — BASIC METABOLIC PANEL WITH GFR
BUN: 17 mg/dL (ref 6–23)
CO2: 26 mEq/L (ref 19–32)
Calcium: 9.4 mg/dL (ref 8.4–10.5)
Chloride: 106 mEq/L (ref 96–112)
Creat: 1.1 mg/dL (ref 0.50–1.10)
GFR, Est African American: 61 mL/min
GFR, Est Non African American: 53 mL/min — ABNORMAL LOW
Glucose, Bld: 70 mg/dL (ref 70–99)
Potassium: 4.4 mEq/L (ref 3.5–5.3)
Sodium: 144 mEq/L (ref 135–145)

## 2014-09-27 NOTE — Addendum Note (Signed)
Addended by: Hulan Fray on: 09/27/2014 09:22 AM   Modules accepted: Orders

## 2014-09-27 NOTE — Addendum Note (Signed)
Addended by: Hulan Fray on: 09/27/2014 09:20 AM   Modules accepted: Orders

## 2014-09-27 NOTE — Telephone Encounter (Signed)
Called patient to inform her of her normalized kidney function.

## 2014-10-01 ENCOUNTER — Encounter: Payer: Self-pay | Admitting: Internal Medicine

## 2014-10-01 ENCOUNTER — Ambulatory Visit (HOSPITAL_COMMUNITY)
Admission: RE | Admit: 2014-10-01 | Discharge: 2014-10-01 | Disposition: A | Discharge status: Home / Self Care | Payer: Commercial Managed Care - HMO | Source: Ambulatory Visit | Attending: Internal Medicine | Admitting: Internal Medicine

## 2014-10-01 ENCOUNTER — Ambulatory Visit (INDEPENDENT_AMBULATORY_CARE_PROVIDER_SITE_OTHER): Payer: Commercial Managed Care - HMO | Admitting: Internal Medicine

## 2014-10-01 VITALS — BP 143/83 | HR 71 | Temp 98.5°F | Ht 68.0 in | Wt 229.2 lb

## 2014-10-01 DIAGNOSIS — H539 Unspecified visual disturbance: Secondary | ICD-10-CM | POA: Insufficient documentation

## 2014-10-01 DIAGNOSIS — I10 Essential (primary) hypertension: Secondary | ICD-10-CM

## 2014-10-01 DIAGNOSIS — R51 Headache: Secondary | ICD-10-CM | POA: Insufficient documentation

## 2014-10-01 DIAGNOSIS — R519 Headache, unspecified: Secondary | ICD-10-CM

## 2014-10-01 DIAGNOSIS — Z853 Personal history of malignant neoplasm of breast: Secondary | ICD-10-CM | POA: Diagnosis not present

## 2014-10-01 DIAGNOSIS — R42 Dizziness and giddiness: Secondary | ICD-10-CM | POA: Insufficient documentation

## 2014-10-01 MED ORDER — HYDRALAZINE HCL 25 MG PO TABS: 25.0000 mg | ORAL_TABLET | Freq: Three times a day (TID) | ORAL | Status: DC

## 2014-10-01 NOTE — Progress Notes (Signed)
Internal Medicine Clinic Attending  Case discussed with Dr. Richardson at the time of the visit.  We reviewed the resident's history and exam and pertinent patient test results.  I agree with the assessment, diagnosis, and plan of care documented in the resident's note. 

## 2014-10-01 NOTE — Progress Notes (Signed)
Subjective:    Patient ID: [name], female    DOB: 08-11-1950, 64 y.o.   MRN: [id number]  HPI Comments: [name] is a 64 year old woman with PMH as below here for follow-up of her BP.  Please see problem based charting for assessment and plan.     Past Medical History  Diagnosis Date  . Depression     seasonal melancholia  . Hyperlipidemia   . Hypertension   . Myopathy     not statin related, cause unknown   . Back pain   . Edema extremities   . Chemotherapy follow-up examination 03/06/2011  . Cough 03/06/2011  . Arthritis     DJD, lumbar spondylosis  . Bronchitis 03/10/2012  . Asthma   . GERD (gastroesophageal reflux disease)   . Cataract   . Breast cancer     left breast invasive ductal ca in remission as of 10/07/11 s/p double mastectomy  . E. coli UTI   . Ileitis   . Kidney stones     non obstructive   . Bilateral renal cysts   . Colon polyps    Current Outpatient Prescriptions on File Prior to Visit  Medication Sig Dispense Refill  . albuterol (PROVENTIL HFA;VENTOLIN HFA) 108 (90 BASE) MCG/ACT inhaler Inhale 1-2 puffs into the lungs every 6 (six) hours as needed for wheezing or shortness of breath. 18 g 2  . aspirin 81 MG tablet Take 81 mg by mouth daily.    . Blood Pressure KIT 1 Device by Does not apply route daily. 1 each 0  . carvedilol (COREG) 12.5 MG tablet Take 2 tablets (25 mg total) by mouth 2 (two) times daily with a meal. 180 tablet 0  . cholecalciferol (VITAMIN D) 1000 UNITS tablet Take 1,000 Units by mouth daily.    . ferrous sulfate 325 (65 FE) MG tablet Take 1 tablet (325 mg total) by mouth 3 (three) times daily with meals. 90 tablet 6  . Fluticasone-Salmeterol (ADVAIR) 100-50 MCG/DOSE AEPB Inhale 1 puff into the lungs 2 (two) times daily as needed (wheezing). 180 each 3  . hydrALAZINE (APRESOLINE) 10 MG tablet Take 1 tablet (10 mg total) by mouth 3 (three) times daily. 90 tablet 3  . letrozole (FEMARA) 2.5 MG tablet TAKE 1 TABLET (2.5  MG TOTAL) BY MOUTH DAILY. 90 tablet 1  . loratadine (CLARITIN) 10 MG tablet Take 1 tablet (10 mg total) by mouth daily. 30 tablet 2  . LORazepam (ATIVAN) 1 MG tablet Take 1 tablet (1 mg total) by mouth daily as needed. for anxiety (Patient not taking: Reported on 08/24/2014) 90 tablet 3  . metFORMIN (GLUCOPHAGE) 500 MG tablet Take 1 tablet (500 mg total) by mouth daily with breakfast. 90 tablet 3  . Multiple Vitamin (MULTIVITAMIN) tablet Take 1 tablet by mouth daily.     Marland Kitchen olmesartan (BENICAR) 20 MG tablet Take 1 tablet (20 mg total) by mouth daily. 30 tablet 1  . ondansetron (ZOFRAN) 4 MG tablet Take 1 tablet (4 mg total) by mouth every 8 (eight) hours as needed for nausea or vomiting. 20 tablet 0  . pantoprazole (PROTONIX) 40 MG tablet Take 1 tablet (40 mg total) by mouth daily. 90 tablet 3  . potassium chloride SA (K-DUR,KLOR-CON) 10 MEQ tablet Take 1 tablet (10 mEq total) by mouth daily. When you take Lasix 30 tablet 3  . PRODIGY NO CODING BLOOD GLUC test strip     . Simethicone 125 MG CAPS Take 1 capsule (  125 mg total) by mouth 4 (four) times daily as needed (gas, bloating). 90 each 2  . simvastatin (ZOCOR) 20 MG tablet TAKE 1 TABLET (20 MG TOTAL) BY MOUTH AT BEDTIME. 90 tablet 3  . sodium chloride (OCEAN) 0.65 % SOLN nasal spray Place 1 spray into both nostrils 4 (four) times daily. (Patient not taking: Reported on 08/24/2014) 2 Bottle 0  . spironolactone (ALDACTONE) 25 MG tablet Take 1 tablet (25 mg total) by mouth daily. 90 tablet 3  . traMADol (ULTRAM) 50 MG tablet Take 1 tablet (50 mg total) by mouth every 6 (six) hours as needed. For pain (Patient not taking: Reported on 08/24/2014) 90 tablet 5  . triamcinolone cream (KENALOG) 0.1 % Apply topically 2 (two) times daily as needed. 454 g 0  . venlafaxine (EFFEXOR) 37.5 MG tablet Take 1 tablet (37.5 mg total) by mouth daily. 90 tablet 1   Current Facility-Administered Medications on File Prior to Visit  Medication Dose Route Frequency Provider  Last Rate Last Dose  . sodium chloride 0.9 % injection 10 mL  10 mL Intravenous PRN Consuela Mimes, MD   10 mL at 01/26/11 1035    Review of Systems  Constitutional: Negative for fever, chills and appetite change.  Respiratory: Negative for shortness of breath.   Cardiovascular: Positive for leg swelling. Negative for chest pain and palpitations.  Gastrointestinal: Negative for nausea, vomiting, abdominal pain, diarrhea, constipation and blood in stool.  Genitourinary: Negative for dysuria and hematuria.  Neurological: Positive for headaches. Negative for syncope and light-headedness.       C/w her usual migraine.       Filed Vitals:   10/01/14 1427 10/01/14 1552  BP: 146/91 143/83  Pulse: 76 71  Temp: 98.5 F (36.9 C)   TempSrc: Oral   Height: _0  (1.727 m)   Weight: 229 lb 3.2 oz (103.964 kg)   SpO2: 98%      Objective:   Physical Exam  Constitutional: She is oriented to person, place, and time. She appears well-developed. No distress.  HENT:  Head: Normocephalic and atraumatic.  Mouth/Throat: Oropharynx is clear and moist. No oropharyngeal exudate.  Eyes: EOM are normal. Pupils are equal, round, and reactive to light.  Neck: Neck supple.  Cardiovascular: Normal rate, regular rhythm and normal heart sounds.  Exam reveals no gallop and no friction rub.   No murmur heard. Pulmonary/Chest: Effort normal and breath sounds normal. No respiratory distress. She has no wheezes. She has no rales.  Abdominal: Soft. Bowel sounds are normal. She exhibits no distension. There is no tenderness. There is no rebound.  Musculoskeletal: Normal range of motion. She exhibits no edema or tenderness.  Neurological: She is alert and oriented to person, place, and time. No cranial nerve deficit.  Skin: Skin is warm. She is not diaphoretic.  Psychiatric: She has a normal mood and affect. Her behavior is normal. Judgment and thought content normal.  Vitals reviewed.         Assessment &  Plan:  Please see problem based charting for assessment and plan.

## 2014-10-01 NOTE — Patient Instructions (Signed)
1. I have increased your hydralazine to 25mg  three times per day.  Please continue to check your blood pressure daily and bring the logs to your next visit.  Please call us if BP drops, you feel lightheaded or dizzy or have any new symptoms on the increased dose.     2. Please take all medications as prescribed.    3. If you have worsening of your symptoms or new symptoms arise, please call the clinic [phone] or go to the ER immediately if symptoms are severe.  Return to see Dr. Marvel Plan on 10/31/14 or sooner if you have problems.

## 2014-10-01 NOTE — Assessment & Plan Note (Addendum)
BP Readings from Last 3 Encounters:  10/01/14 146/91  09/26/14 174/88  09/04/14 181/90    Lab Results  Component Value Date   NA 144 09/26/2014   K 4.4 09/26/2014   CREATININE 1.10 09/26/2014    Assessment: Blood pressure control:  slightly elevated Progress toward BP goal:   improving Comments:  Compliant with medications and checking BP multiple times per day.  She has not had problems taking TID med.  She brought BP log and values are mostly 140-150s/90s (there are a few DBP 100-104).  She has had tighter control in the past with higher dose Benicar and Lasix but Lasix stopped and Benicar reduced due to deteriorating renal function.  Most meals cooked at home, mostly baked foods.  She exercises at home.    Plan: Medications:  continue current medications:  Coreg 25mg  BID, Benicar 20mg  daily, Spironolactone 25mg  daily, INCREASE hydralazine from 10mg  TID to 25mg  TID Educational resources provided: brochure, handout, video Other plans: Advised continue exercise as modest weight loss can help with BP control.  RTC to see PCP next month as previously scheduled.  She will call if symptoms on increased dose.  Check BMP next visit.

## 2014-10-02 NOTE — Progress Notes (Signed)
Internal Medicine Clinic Attending  Case discussed with Dr. Wilson soon after the resident saw the patient.  We reviewed the resident's history and exam and pertinent patient test results.  I agree with the assessment, diagnosis, and plan of care documented in the resident's note.  

## 2014-10-05 ENCOUNTER — Other Ambulatory Visit: Payer: Self-pay | Admitting: Internal Medicine

## 2014-10-05 DIAGNOSIS — I10 Essential (primary) hypertension: Secondary | ICD-10-CM

## 2014-10-05 NOTE — Telephone Encounter (Signed)
Patient requesting BP meds [facility] [address].

## 2014-10-08 MED ORDER — OLMESARTAN MEDOXOMIL 20 MG PO TABS: 20.0000 mg | ORAL_TABLET | Freq: Every day | ORAL | Status: DC

## 2014-10-17 ENCOUNTER — Encounter: Payer: Self-pay | Admitting: *Deleted

## 2014-10-19 ENCOUNTER — Encounter: Payer: Self-pay | Admitting: *Deleted

## 2014-10-19 NOTE — Progress Notes (Signed)
Breast cancer, Bilateral mastectomy "Problem Detail   Noted:  01/15/2010   Overview Addendum 05/25/2012 11:54 AM by Wilber Oliphant, MD  Per Dr. Laurelyn Sickle last clinic note 04/2012:  stage II a invasive ductal carcinoma of the left breast diagnosed in October 2011. She was found to have a ER/PR positive HER-2/neu positive breast cancer.She underwent bilateral mastectomies. Her right mastectomy was an elective prophylactic mastectomy due to her on comfort. Patient subsequently underwent adjuvant chemotherapy consisting of Taxotere carboplatinum and Herceptin for a total of 6 cycles. Thereafter she received Herceptin every 3 weeks and she has been on Letrozole 2.5 mg daily."

## 2014-10-25 ENCOUNTER — Other Ambulatory Visit: Payer: Self-pay | Admitting: Internal Medicine

## 2014-10-29 ENCOUNTER — Telehealth: Payer: Self-pay | Admitting: *Deleted

## 2014-10-29 ENCOUNTER — Other Ambulatory Visit: Payer: Self-pay | Admitting: Internal Medicine

## 2014-10-29 DIAGNOSIS — J45991 Cough variant asthma: Secondary | ICD-10-CM

## 2014-10-29 DIAGNOSIS — G4733 Obstructive sleep apnea (adult) (pediatric): Secondary | ICD-10-CM

## 2014-10-29 DIAGNOSIS — E1165 Type 2 diabetes mellitus with hyperglycemia: Secondary | ICD-10-CM

## 2014-10-29 MED ORDER — PRODIGY VOICE BLOOD GLUCOSE W/DEVICE KIT: 1.0000 | PACK | Freq: Every day | Status: DC

## 2014-10-29 MED ORDER — PRODIGY NO CODING BLOOD GLUC VI STRP: 1.0000 | ORAL_STRIP | Freq: Every day | Status: DC

## 2014-10-29 NOTE — Telephone Encounter (Signed)
PATIENT CALLED REQUESTING METER FOR BLOOD SUGAR / PRODIGY & THE METER THAT HAS THE VOICE.  ANY QUESTION PATIENT CAN BE CONTACTED AT [place]

## 2014-10-31 ENCOUNTER — Encounter: Payer: Self-pay | Admitting: Internal Medicine

## 2014-10-31 ENCOUNTER — Ambulatory Visit (INDEPENDENT_AMBULATORY_CARE_PROVIDER_SITE_OTHER): Payer: Commercial Managed Care - HMO | Admitting: Internal Medicine

## 2014-10-31 VITALS — BP 145/80 | HR 68 | Temp 98.5°F | Ht 68.0 in | Wt 233.7 lb

## 2014-10-31 DIAGNOSIS — Z114 Encounter for screening for human immunodeficiency virus [HIV]: Secondary | ICD-10-CM

## 2014-10-31 DIAGNOSIS — I129 Hypertensive chronic kidney disease with stage 1 through stage 4 chronic kidney disease, or unspecified chronic kidney disease: Secondary | ICD-10-CM | POA: Diagnosis not present

## 2014-10-31 DIAGNOSIS — E1122 Type 2 diabetes mellitus with diabetic chronic kidney disease: Secondary | ICD-10-CM

## 2014-10-31 DIAGNOSIS — I10 Essential (primary) hypertension: Secondary | ICD-10-CM | POA: Diagnosis not present

## 2014-10-31 DIAGNOSIS — N183 Chronic kidney disease, stage 3 unspecified: Secondary | ICD-10-CM

## 2014-10-31 DIAGNOSIS — E119 Type 2 diabetes mellitus without complications: Secondary | ICD-10-CM | POA: Diagnosis not present

## 2014-10-31 DIAGNOSIS — Z1159 Encounter for screening for other viral diseases: Secondary | ICD-10-CM | POA: Diagnosis not present

## 2014-10-31 DIAGNOSIS — Z0000 Encounter for general adult medical examination without abnormal findings: Secondary | ICD-10-CM

## 2014-10-31 LAB — GLUCOSE, CAPILLARY: Glucose-Capillary: 69 mg/dL (ref 65–99)

## 2014-10-31 MED ORDER — HYDRALAZINE HCL 25 MG PO TABS: 25.0000 mg | ORAL_TABLET | Freq: Two times a day (BID) | ORAL | Status: DC

## 2014-10-31 NOTE — Assessment & Plan Note (Signed)
Lab Results  Component Value Date   HGBA1C 6.2 08/23/2014   HGBA1C 6.4 04/26/2014   HGBA1C 5.9 01/18/2014     Assessment: Diabetes control:  Well controlled Progress toward A1C goal:   Below goal Comments: Compliant with metformin 500 mg daily; however patient has been having some hypoglycemic episodes at home in the 60s-70s.  Plan: Medications:  STOP Metformin Other plans: recheck A1c in 3 months

## 2014-10-31 NOTE — Progress Notes (Signed)
Subjective:    Patient ID: [name], female    DOB: February 28, 1951, 64 y.o.   MRN: [id number]  HPI Ms. Hammad is a 64 yo female with PMHx of HTN, HLD, GERD, Asthma, CKD III, and h/o Breast Cancer who presents for follow up for her HTN. Please see problem based assessment and plan for more information.  Review of Systems General: Denies fever, chills, fatigue Respiratory: Denies SOB, cough   Cardiovascular: Denies chest pain and palpitations.  Gastrointestinal: Denies nausea, vomiting, abdominal pain, diarrhea, constipation Musculoskeletal: Denies myalgias, back pain Skin: Denies pallor, rash and wounds.  Neurological: Denies dizziness, headaches, weakness, lightheadedness  Past Medical History  Diagnosis Date  . Depression     seasonal melancholia  . Hyperlipidemia   . Hypertension   . Myopathy     not statin related, cause unknown   . Back pain   . Edema extremities   . Chemotherapy follow-up examination 03/06/2011  . Cough 03/06/2011  . Arthritis     DJD, lumbar spondylosis  . Bronchitis 03/10/2012  . Asthma   . GERD (gastroesophageal reflux disease)   . Cataract   . Breast cancer     left breast invasive ductal ca in remission as of 10/07/11 s/p double mastectomy  . E. coli UTI   . Ileitis   . Kidney stones     non obstructive   . Bilateral renal cysts   . Colon polyps    Outpatient Encounter Prescriptions as of 10/31/2014  Medication Sig Note  . albuterol (PROVENTIL HFA;VENTOLIN HFA) 108 (90 BASE) MCG/ACT inhaler Inhale 1-2 puffs into the lungs every 6 (six) hours as needed for wheezing or shortness of breath.   Marland Kitchen aspirin 81 MG tablet Take 81 mg by mouth daily.   . Blood Glucose Monitoring Suppl (PRODIGY VOICE BLOOD GLUCOSE) W/DEVICE KIT 1 Device by Does not apply route daily.   . Blood Pressure KIT 1 Device by Does not apply route daily.   . carvedilol (COREG) 12.5 MG tablet Take 2 tablets (25 mg total) by mouth 2 (two) times daily with a meal.   .  cholecalciferol (VITAMIN D) 1000 UNITS tablet Take 1,000 Units by mouth daily.   . ferrous sulfate 325 (65 FE) MG tablet Take 1 tablet (325 mg total) by mouth 3 (three) times daily with meals.   . Fluticasone-Salmeterol (ADVAIR) 100-50 MCG/DOSE AEPB Inhale 1 puff into the lungs 2 (two) times daily as needed (wheezing).   . hydrALAZINE (APRESOLINE) 25 MG tablet Take 1 tablet (25 mg total) by mouth 2 (two) times daily.   Marland Kitchen letrozole (FEMARA) 2.5 MG tablet TAKE 1 TABLET (2.5 MG TOTAL) BY MOUTH DAILY. 08/24/2014: Will take until 2017   . loratadine (CLARITIN) 10 MG tablet Take 1 tablet (10 mg total) by mouth daily.   Marland Kitchen LORazepam (ATIVAN) 1 MG tablet Take 1 tablet (1 mg total) by mouth daily as needed. for anxiety   . metFORMIN (GLUCOPHAGE) 500 MG tablet Take 1 tablet (500 mg total) by mouth daily with breakfast.   . Multiple Vitamin (MULTIVITAMIN) tablet Take 1 tablet by mouth daily.    Marland Kitchen olmesartan (BENICAR) 20 MG tablet Take 1 tablet (20 mg total) by mouth daily.   . ondansetron (ZOFRAN) 4 MG tablet Take 1 tablet (4 mg total) by mouth every 8 (eight) hours as needed for nausea or vomiting.   . pantoprazole (PROTONIX) 40 MG tablet Take 1 tablet (40 mg total) by mouth daily.   . potassium chloride  SA (K-DUR,KLOR-CON) 10 MEQ tablet Take 1 tablet (10 mEq total) by mouth daily. When you take Lasix   . PRODIGY NO CODING BLOOD GLUC test strip 1 each by Other route daily.   . Simethicone 125 MG CAPS Take 1 capsule (125 mg total) by mouth 4 (four) times daily as needed (gas, bloating).   . simvastatin (ZOCOR) 20 MG tablet TAKE 1 TABLET (20 MG TOTAL) BY MOUTH AT BEDTIME.   . sodium chloride (OCEAN) 0.65 % SOLN nasal spray Place 1 spray into both nostrils 4 (four) times daily.   Marland Kitchen spironolactone (ALDACTONE) 25 MG tablet Take 1 tablet (25 mg total) by mouth daily.   . traMADol (ULTRAM) 50 MG tablet Take 1 tablet (50 mg total) by mouth every 6 (six) hours as needed. For pain   . triamcinolone cream (KENALOG) 0.1 %  Apply topically 2 (two) times daily as needed.   . venlafaxine (EFFEXOR) 37.5 MG tablet Take 1 tablet (37.5 mg total) by mouth daily.   . [DISCONTINUED] hydrALAZINE (APRESOLINE) 25 MG tablet TAKE 1 TABLET (25 MG TOTAL) BY MOUTH 3 (THREE) TIMES DAILY.    Facility-Administered Encounter Medications as of 10/31/2014  Medication  . sodium chloride 0.9 % injection 10 mL       Objective:   Physical Exam Filed Vitals:   10/31/14 1311 10/31/14 1346  BP: 164/90 145/80  Pulse: 80 68  Temp: 98.5 F (36.9 C)   TempSrc: Oral   Height: 5' 8" (1.727 m)   Weight: 233 lb 11.2 oz (106.006 kg)   SpO2: 100%    General: Vital signs reviewed.  Patient is well-developed and well-nourished, in no acute distress and cooperative with exam.  Cardiovascular: RRR, S1 normal, S2 normal Pulmonary/Chest: Clear to auscultation bilaterally, no wheezes, rales, or rhonchi. Abdominal: Soft, non-tender, non-distended, BS + Extremities: No lower extremity edema bilaterally, pulses symmetric and intact bilaterally. No cyanosis or clubbing. Neurological: A&O x3 Skin: Warm, dry and intact. No rashes or erythema. Psychiatric: Normal mood and affect. speech and behavior is normal. Cognition and memory are normal.     Assessment & Plan:   Please see problem based assessment and plan.

## 2014-10-31 NOTE — Assessment & Plan Note (Addendum)
BP Readings from Last 3 Encounters:  10/31/14 145/80  10/01/14 143/83  09/26/14 174/88    Lab Results  Component Value Date   NA 144 09/26/2014   K 4.4 09/26/2014   CREATININE 1.10 09/26/2014    Assessment: Blood pressure control:  Controlled Progress toward BP goal:    At goal Comments: Patient is on coreg 25 mg BID, Benicar 20 mg daily, spironolactone 25 mg daily, and hydralazine 25 mg BID (which was recently increased from 10 mg TID)  Plan: Medications:  Continue current medications

## 2014-10-31 NOTE — Patient Instructions (Signed)
General Instructions:   Please bring your medicines with you each time you come to clinic.  Medicines may include prescription medications, over-the-counter medications, herbal remedies, eye drops, vitamins, or other pills.  For your diabetes: - you can stop taking your Metformin - Continue to check your blood sugar occasionally - If morning blood sugars before you eat are >120, we may need to restart Metformin 500 mg once a day  For your hypertension: -continue all medications the same  Return in 3 months  Diabetes and Exercise Exercising regularly is important. It is not just about losing weight. It has many health benefits, such as:  Improving your overall fitness, flexibility, and endurance.  Increasing your bone density.  Helping with weight control.  Decreasing your body fat.  Increasing your muscle strength.  Reducing stress and tension.  Improving your overall health. People with diabetes who exercise gain additional benefits because exercise:  Reduces appetite.  Improves the body's use of blood sugar (glucose).  Helps lower or control blood glucose.  Decreases blood pressure.  Helps control blood lipids (such as cholesterol and triglycerides).  Improves the body's use of the hormone insulin by:  Increasing the body's insulin sensitivity.  Reducing the body's insulin needs.  Decreases the risk for heart disease because exercising:  Lowers cholesterol and triglycerides levels.  Increases the levels of good cholesterol (such as high-density lipoproteins [HDL]) in the body.  Lowers blood glucose levels. YOUR ACTIVITY PLAN  Choose an activity that you enjoy and set realistic goals. Your health care provider or diabetes educator can help you make an activity plan that works for you. Exercise regularly as directed by your health care provider. This includes:  Performing resistance training twice a week such as push-ups, sit-ups, lifting weights, or using  resistance bands.  Performing 150 minutes of cardio exercises each week such as walking, running, or playing sports.  Staying active and spending no more than 90 minutes at one time being inactive. Even short bursts of exercise are good for you. Three 10-minute sessions spread throughout the day are just as beneficial as a single 30-minute session. Some exercise ideas include:  Taking the dog for a walk.  Taking the stairs instead of the elevator.  Dancing to your favorite song.  Doing an exercise video.  Doing your favorite exercise with a friend. RECOMMENDATIONS FOR EXERCISING WITH TYPE 1 OR TYPE 2 DIABETES   Check your blood glucose before exercising. If blood glucose levels are greater than 240 mg/dL, check for urine ketones. Do not exercise if ketones are present.  Avoid injecting insulin into areas of the body that are going to be exercised. For example, avoid injecting insulin into:  The arms when playing tennis.  The legs when jogging.  Keep a record of:  Food intake before and after you exercise.  Expected peak times of insulin action.  Blood glucose levels before and after you exercise.  The type and amount of exercise you have done.  Review your records with your health care provider. Your health care provider will help you to develop guidelines for adjusting food intake and insulin amounts before and after exercising.  If you take insulin or oral hypoglycemic agents, watch for signs and symptoms of hypoglycemia. They include:  Dizziness.  Shaking.  Sweating.  Chills.  Confusion.  Drink plenty of water while you exercise to prevent dehydration or heat stroke. Body water is lost during exercise and must be replaced.  Talk to your health care provider before  starting an exercise program to make sure it is safe for you. Remember, almost any type of activity is better than none. Document Released: 05/30/2003 Document Revised: 07/24/2013 Document Reviewed:  08/16/2012 [facility] Patient Information 2015 [place], [place]. This information is not intended to replace advice given to you by your health care provider. Make sure you discuss any questions you have with your health care provider.

## 2014-10-31 NOTE — Assessment & Plan Note (Signed)
Kidney function has improved with downtrending creatinine: 1.44 to 1.1 on 09/26/14, at baseline. Patient was reminded to stop her Benicar during times of illness in order to avoid AKI.

## 2014-10-31 NOTE — Assessment & Plan Note (Signed)
Check HIV and Hepatitis C today.

## 2014-11-01 ENCOUNTER — Telehealth: Payer: Self-pay | Admitting: Internal Medicine

## 2014-11-01 LAB — HIV ANTIBODY (ROUTINE TESTING W REFLEX): HIV Screen 4th Generation wRfx: NONREACTIVE

## 2014-11-01 LAB — HEPATITIS C ANTIBODY: Hep C Virus Ab: <0.1 s/co ratio (ref 0.0–0.9)

## 2014-11-01 NOTE — Telephone Encounter (Signed)
Informed patient of negative HIV and Hepatitis C results.

## 2014-11-02 NOTE — Progress Notes (Signed)
Internal Medicine Clinic Attending  Case discussed with Dr. Richardson soon after the resident saw the patient.  We reviewed the resident's history and exam and pertinent patient test results.  I agree with the assessment, diagnosis, and plan of care documented in the resident's note. 

## 2014-11-06 DIAGNOSIS — C50919 Malignant neoplasm of unspecified site of unspecified female breast: Secondary | ICD-10-CM | POA: Diagnosis not present

## 2014-11-12 ENCOUNTER — Telehealth: Payer: Self-pay

## 2014-11-12 NOTE — Telephone Encounter (Signed)
Order faxed to [facility].  Sent to scan.

## 2014-11-22 DIAGNOSIS — C50919 Malignant neoplasm of unspecified site of unspecified female breast: Secondary | ICD-10-CM | POA: Diagnosis not present

## 2014-11-30 ENCOUNTER — Ambulatory Visit: Payer: Commercial Managed Care - HMO | Admitting: Pulmonary Disease

## 2014-12-03 ENCOUNTER — Other Ambulatory Visit: Payer: Self-pay | Admitting: Student in an Organized Health Care Education/Training Program

## 2014-12-14 ENCOUNTER — Other Ambulatory Visit: Payer: Self-pay | Admitting: *Deleted

## 2014-12-17 ENCOUNTER — Ambulatory Visit (INDEPENDENT_AMBULATORY_CARE_PROVIDER_SITE_OTHER): Payer: Commercial Managed Care - HMO | Admitting: *Deleted

## 2014-12-17 DIAGNOSIS — Z23 Encounter for immunization: Secondary | ICD-10-CM

## 2014-12-17 MED ORDER — BD SWAB SINGLE USE REGULAR PADS: 1.0000 "application " | MEDICATED_PAD | Status: DC

## 2014-12-18 ENCOUNTER — Other Ambulatory Visit: Payer: Self-pay | Admitting: Internal Medicine

## 2014-12-19 ENCOUNTER — Encounter: Payer: Self-pay | Admitting: Pulmonary Disease

## 2014-12-19 ENCOUNTER — Ambulatory Visit (INDEPENDENT_AMBULATORY_CARE_PROVIDER_SITE_OTHER): Payer: Commercial Managed Care - HMO | Admitting: Pulmonary Disease

## 2014-12-19 VITALS — BP 118/64 | HR 82 | Temp 98.2°F | Ht 68.0 in | Wt 232.8 lb

## 2014-12-19 DIAGNOSIS — J45991 Cough variant asthma: Secondary | ICD-10-CM | POA: Diagnosis not present

## 2014-12-19 DIAGNOSIS — G4733 Obstructive sleep apnea (adult) (pediatric): Secondary | ICD-10-CM | POA: Diagnosis not present

## 2014-12-19 NOTE — Assessment & Plan Note (Signed)
seasonal allergic asthma Use advair during spring & fall Call as needed

## 2014-12-19 NOTE — Assessment & Plan Note (Signed)
Did not tolerate CPAP

## 2014-12-19 NOTE — Progress Notes (Signed)
   Subjective:    Patient ID: [name], female    DOB: 1950/08/26, 64 y.o.   MRN: [id number]  HPI  63/F, never smoker, for FU of OSA & recurrent cough & wheezing  Attributed to cough variant asthma , sinuses & GERD  She reported intermittent wheezing especially in spring & fall for 3 yrs , wheezing was noted in the clinic in 01/2011 and she was given a diagnosis of asthma. She takes advair only seasonally. Off daily advair since 1/ 2014.  She is s/p chemotherapy for Breast cancer (Dr Humphrey Rolls) . Grade 1 diastolic dysfunction noted by Dr Haroldine Laws on echo. She reports pedal edema & takes lasix daily. She was on lisinopril for about 2 yrs - changed to olmesartan  spirometry 9/09 wnl & 9/12 -no obstruction.     12/19/2014  Chief Complaint  Patient presents with  . Yearly Follow up    Breathing doing well overall.  Occas wheezing qhs.  No cough, SOB, chest tightness, or CP.   Annual FU Only takes advair once daily - when symptomatic , not all the time Denies cough sputum production or pedal edema, Has not needed albuterol at all No noct wheeze, occ post nasal drip + , no relfux   Significant tests/ events  Methacholine challenge 9/09 drop in smaller airways >> added singulair   Head CT 9/12 showed Ethmoid sinus mucosal thickening and bubbly opacity in the left sphenoid  PSG 06/2012 showed mild OSA -  she felt very claustrophobic,she could not afford the CPAP - hence abandoned  Review of Systems neg for any significant sore throat, dysphagia, itching, sneezing, nasal congestion or excess/ purulent secretions, fever, chills, sweats, unintended wt loss, pleuritic or exertional cp, hempoptysis, orthopnea pnd or change in chronic leg swelling. Also denies presyncope, palpitations, heartburn, abdominal pain, nausea, vomiting, diarrhea or change in bowel or urinary habits, dysuria,hematuria, rash, arthralgias, visual complaints, headache, numbness weakness or ataxia.     Objective:   Physical Exam  Gen. Pleasant, well-nourished, in no distress ENT - no lesions, no post nasal drip Neck: No JVD, no thyromegaly, no carotid bruits Lungs: no use of accessory muscles, no dullness to percussion, clear without rales or rhonchi  Cardiovascular: Rhythm regular, heart sounds  normal, no murmurs or gallops, no peripheral edema Musculoskeletal: No deformities, no cyanosis or clubbing        Assessment & Plan:

## 2014-12-19 NOTE — Patient Instructions (Signed)
You have seasonal allergic asthma Use advair during spring & fall Call as needed

## 2014-12-20 ENCOUNTER — Other Ambulatory Visit: Payer: Self-pay | Admitting: Internal Medicine

## 2014-12-20 DIAGNOSIS — I10 Essential (primary) hypertension: Secondary | ICD-10-CM

## 2014-12-20 MED ORDER — OLMESARTAN MEDOXOMIL 20 MG PO TABS: 20.0000 mg | ORAL_TABLET | Freq: Every day | ORAL | Status: DC

## 2014-12-20 NOTE — Telephone Encounter (Signed)
Pt called requesting Benicar to be filled. Please call pt back.

## 2014-12-20 NOTE — Telephone Encounter (Signed)
[facility]  Pt now gets meds from [facility] and would like a 90 day supply.

## 2015-02-12 ENCOUNTER — Encounter: Payer: Self-pay | Admitting: Student

## 2015-02-19 ENCOUNTER — Other Ambulatory Visit: Payer: Self-pay | Admitting: Hematology and Oncology

## 2015-02-20 NOTE — Telephone Encounter (Signed)
Chart reviewed.

## 2015-02-21 ENCOUNTER — Telehealth: Payer: Self-pay | Admitting: *Deleted

## 2015-02-21 NOTE — Telephone Encounter (Signed)
-----   Message from Vickii Chafe, MD sent at 02/19/2015  4:52 PM EST ----- Regarding: No need for mammogram When it is convenient for you, do you mind calling this patient and let her know that screening mammograms are not recommend for her after bilateral mastectomy. Only annual physical exams and monthly self exams are recommended.  She had called and questioned this since she got a letter in the mail reminding her to get a mammogram.  Thank you! [place]

## 2015-02-21 NOTE — Telephone Encounter (Signed)
Pt called / informed screening mammograms are not recommended only annual physical exams and monthly self exams per Dr Marvel Plan. Stated she does monthly exams from time to time. And she was wondering about next dexa scan ; told her she had one done in 2015 per [employer].

## 2015-02-25 DIAGNOSIS — C50912 Malignant neoplasm of unspecified site of left female breast: Secondary | ICD-10-CM | POA: Diagnosis not present

## 2015-02-25 DIAGNOSIS — C50911 Malignant neoplasm of unspecified site of right female breast: Secondary | ICD-10-CM | POA: Diagnosis not present

## 2015-02-27 ENCOUNTER — Encounter: Payer: Self-pay | Admitting: Internal Medicine

## 2015-02-27 ENCOUNTER — Ambulatory Visit (INDEPENDENT_AMBULATORY_CARE_PROVIDER_SITE_OTHER): Payer: Commercial Managed Care - HMO | Admitting: Internal Medicine

## 2015-02-27 VITALS — BP 146/81 | HR 82 | Temp 98.3°F | Ht 68.0 in | Wt 235.2 lb

## 2015-02-27 DIAGNOSIS — Z853 Personal history of malignant neoplasm of breast: Secondary | ICD-10-CM | POA: Diagnosis not present

## 2015-02-27 DIAGNOSIS — C50912 Malignant neoplasm of unspecified site of left female breast: Secondary | ICD-10-CM

## 2015-02-27 DIAGNOSIS — E119 Type 2 diabetes mellitus without complications: Secondary | ICD-10-CM | POA: Diagnosis not present

## 2015-02-27 DIAGNOSIS — M8580 Other specified disorders of bone density and structure, unspecified site: Secondary | ICD-10-CM | POA: Insufficient documentation

## 2015-02-27 DIAGNOSIS — Z0000 Encounter for general adult medical examination without abnormal findings: Secondary | ICD-10-CM

## 2015-02-27 DIAGNOSIS — Z9013 Acquired absence of bilateral breasts and nipples: Secondary | ICD-10-CM | POA: Diagnosis not present

## 2015-02-27 DIAGNOSIS — I10 Essential (primary) hypertension: Secondary | ICD-10-CM | POA: Diagnosis not present

## 2015-02-27 DIAGNOSIS — K644 Residual hemorrhoidal skin tags: Secondary | ICD-10-CM | POA: Insufficient documentation

## 2015-02-27 DIAGNOSIS — Z6835 Body mass index (BMI) 35.0-35.9, adult: Secondary | ICD-10-CM

## 2015-02-27 DIAGNOSIS — E669 Obesity, unspecified: Secondary | ICD-10-CM

## 2015-02-27 LAB — POCT GLYCOSYLATED HEMOGLOBIN (HGB A1C): Hemoglobin A1C: 6.8

## 2015-02-27 LAB — GLUCOSE, CAPILLARY: Glucose-Capillary: 127 mg/dL — ABNORMAL HIGH (ref 65–99)

## 2015-02-27 MED ORDER — CALCIUM 600 MG PO TABS: 600.0000 mg | ORAL_TABLET | Freq: Two times a day (BID) | ORAL | Status: DC

## 2015-02-27 MED ORDER — HYDROCORTISONE 2.5 % RE CREA: 1.0000 "application " | TOPICAL_CREAM | Freq: Two times a day (BID) | RECTAL | Status: DC

## 2015-02-27 MED ORDER — METFORMIN HCL 500 MG PO TABS
500.0000 mg | ORAL_TABLET | Freq: Every day | ORAL | Status: DC
Start: 2015-02-27 — End: 2016-03-10

## 2015-02-27 NOTE — Assessment & Plan Note (Signed)
DEXA in 2015 normal. Patient does not need to take vitamin D and calcium unless she desires to.

## 2015-02-27 NOTE — Progress Notes (Signed)
Subjective:    Patient ID: [name], female    DOB: [birth year], 64 y.o.   MRN: [id number]  HPI [name] is a 64 y.o. female with PMHx of T2DM, HTN, CKD, Hepatitis C, HIV who presents to the clinic for HTN and T2DM. Please see A&P for the status of the patient's chronic medical problems.   Past Medical History  Diagnosis Date  . Depression     seasonal melancholia  . Hyperlipidemia   . Hypertension   . Myopathy     not statin related, cause unknown   . Back pain   . Edema extremities   . Chemotherapy follow-up examination 03/06/2011  . Cough 03/06/2011  . Arthritis     DJD, lumbar spondylosis  . Bronchitis 03/10/2012  . Asthma   . GERD (gastroesophageal reflux disease)   . Cataract   . Breast cancer ([place])     left breast invasive ductal ca in remission as of 10/07/11 s/p double mastectomy  . E. coli UTI   . Ileitis   . Kidney stones     non obstructive   . Bilateral renal cysts   . Colon polyps     Outpatient Encounter Prescriptions as of 02/27/2015  Medication Sig Note  . albuterol (PROVENTIL HFA;VENTOLIN HFA) 108 (90 BASE) MCG/ACT inhaler Inhale 1-2 puffs into the lungs every 6 (six) hours as needed for wheezing or shortness of breath.   . Alcohol Swabs (B-D SINGLE USE SWABS REGULAR) PADS 1 application by Does not apply route 1 day or 1 dose. Use 1 time daily to check blood sugar. diag code E11.9. Non insulin dependent   . aspirin 81 MG tablet Take 81 mg by mouth daily.   . Blood Glucose Monitoring Suppl (PRODIGY VOICE BLOOD GLUCOSE) W/DEVICE KIT 1 Device by Does not apply route daily.   . Blood Pressure KIT 1 Device by Does not apply route daily.   . carvedilol (COREG) 12.5 MG tablet Take 2 tablets (25 mg total) by mouth 2 (two) times daily with a meal.   . cholecalciferol (VITAMIN D) 1000 UNITS tablet Take 1,000 Units by mouth daily.   . ferrous sulfate 325 (65 FE) MG tablet Take 1 tablet (325 mg total) by mouth 3 (three) times daily with meals.   .  Fluticasone-Salmeterol (ADVAIR) 100-50 MCG/DOSE AEPB Inhale 1 puff into the lungs 2 (two) times daily as needed (wheezing).   . hydrALAZINE (APRESOLINE) 10 MG tablet Take 1 tablet by mouth 2 (two) times daily. 12/19/2014: Received from: External Pharmacy Received Sig:   . letrozole ([place]) 2.5 MG tablet TAKE 1 TABLET EVERY DAY   . loratadine (CLARITIN) 10 MG tablet Take 1 tablet (10 mg total) by mouth daily.   Marland Kitchen LORazepam (ATIVAN) 1 MG tablet as needed. 12/19/2014: Received from: External Pharmacy Received Sig:   . Multiple Vitamin (MULTIVITAMIN) tablet Take 1 tablet by mouth daily.    Marland Kitchen olmesartan (BENICAR) 20 MG tablet Take 1 tablet (20 mg total) by mouth daily.   . ondansetron (ZOFRAN) 4 MG tablet Take 1 tablet (4 mg total) by mouth every 8 (eight) hours as needed for nausea or vomiting.   . pantoprazole (PROTONIX) 40 MG tablet Take 1 tablet (40 mg total) by mouth daily.   Marland Kitchen PRODIGY NO CODING BLOOD GLUC test strip 1 each by Other route daily.   . Simethicone 125 MG CAPS Take 1 capsule (125 mg total) by mouth 4 (four) times daily as needed (gas, bloating).   Marland Kitchen  simvastatin (ZOCOR) 20 MG tablet TAKE 1 TABLET (20 MG TOTAL) BY MOUTH AT BEDTIME.   . sodium chloride (OCEAN) 0.65 % SOLN nasal spray Place 1 spray into both nostrils 4 (four) times daily. (Patient taking differently: Place 1 spray into both nostrils 4 (four) times daily as needed. )   . spironolactone (ALDACTONE) 25 MG tablet Take 1 tablet (25 mg total) by mouth daily.   . traMADol (ULTRAM) 50 MG tablet as needed. 12/19/2014: Received from: External Pharmacy Received Sig:   . triamcinolone cream (KENALOG) 0.1 % Apply topically 2 (two) times daily as needed.   . venlafaxine (EFFEXOR) 37.5 MG tablet Take 1 tablet (37.5 mg total) by mouth daily. (Patient taking differently: Take 37.5 mg by mouth daily as needed. )    Facility-Administered Encounter Medications as of 02/27/2015  Medication  . sodium chloride 0.9 % injection 10 mL    Family  History  Problem Relation Age of Onset  . Heart disease Mother 34    CAD  . Breast cancer Mother   . Cancer Mother 41    Breast cancer, mother  . Cancer Father 67    colon cancer dx'ed age 68  . Coronary artery disease Other   . Multiple sclerosis Daughter   . Diabetes Father   . Diabetes Brother   . Diabetes Brother     Social History   Social History  . Marital Status: Single    Spouse Name: N/A  . Number of Children: N/A  . Years of Education: N/A   Occupational History  . Not on file.   Social History Main Topics  . Smoking status: Never Smoker   . Smokeless tobacco: Never Used  . Alcohol Use: No  . Drug Use: No  . Sexual Activity: Not on file   Other Topics Concern  . Not on file   Social History Narrative   Single, 2 kids   Never smoked   No alcohol use   No drugs   Laid off from [employer] after working there for 14 years.     Review of Systems General: Denies fatigue, change in appetite.  Respiratory: Denies SOB, cough, DOE.   Cardiovascular: Denies chest pain and palpitations.  Gastrointestinal: Admits to weight gain and fluid in her abdomen. Denies nausea, vomiting, abdominal pain, diarrhea, constipation, blood in stool Endocrine: Denies polyuria, and polydipsia. Musculoskeletal: Denies myalgias Neurological: Denies dizziness, headaches, weakness, lightheadedness, numbness     Objective:   Physical Exam Filed Vitals:   02/27/15 1325  BP: 147/81  Pulse: 90  Temp: 98.3 F (36.8 C)  TempSrc: Oral  Height: _0  (1.727 m)  Weight: 235 lb 3.2 oz (106.686 kg)  SpO2: 100%   General: Vital signs reviewed.  Patient is well-developed and well-nourished, in no acute distress and cooperative with exam.  Cardiovascular: RRR, S1 normal, S2 normal, no murmurs, gallops, or rubs. Pulmonary/Chest: Clear to auscultation bilaterally, no wheezes, rales, or rhonchi. Abdominal: Obese, soft, non-tender, non-distended, BS +, no guarding present. No  fluid wave. Extremities: No lower extremity edema bilaterally,  pulses symmetric and intact bilaterally. Skin: Warm, dry and intact.  Psychiatric: Normal mood and affect. speech and behavior is normal.     Assessment & Plan:   Please see problem based assessment and plan.

## 2015-02-27 NOTE — Assessment & Plan Note (Signed)
Patient is status post bilateral mastectomies for invasive ductal carcinoma of left breast diagnosed in 2011. She has a follow up appointment in May with her Oncologist, Dr. Lindi Adie. She would also like to follow up with her surgeon, Dr. [name]. Current screening guidelines recommend yearly breast exams by physician and monthly patient self-breast exams.   Plan: -Follow up with Dr. Lindi Adie in May -Referral to Dr. [name], General Surgery for follow up

## 2015-02-27 NOTE — Assessment & Plan Note (Signed)
Patient complains of weight gain with "fluid" in her belly. She has gained 2 pounds since I last saw her about 3 months ago. She feels like it is all fluid in her abdomen and not fat. Her weight is 235 today. She denies any shortness of breath, abdominal pain or lower extremity edema. She had normal liver enzymes in May 2016 and history of grade 1 diastolic CHF. Physical exam reveals a non-tender, obese abdomen without evidence of ascites or fluid wave. Her weight gain and large abdomen are secondary to central obesity.  Plan: -Continue to modify diet and increase exercise

## 2015-02-27 NOTE — Patient Instructions (Addendum)
TAKE METFORMIN 500 MG ONCE A DAY WITH BREAKFAST.  WE HAVE REFERRED YOU TO DRS. TEPEDINO, [facility], AND [facility].

## 2015-02-27 NOTE — Assessment & Plan Note (Addendum)
BP Readings from Last 3 Encounters:  02/27/15 146/81  12/19/14 118/64  10/31/14 145/80    Lab Results  Component Value Date   NA 144 09/26/2014   K 4.4 09/26/2014   CREATININE 1.10 09/26/2014    Assessment: Blood pressure control:   Just above goal Progress toward BP goal:   Deteriorated Comments: Compliant with Coreg 25 mg BID, Benicar 20 mg daily, spironolactone 25 mg daily and only taking hydralazine 25 mg BID.   Plan: Medications:  continue current medications Other plans: If elevated at follow up visit, increase Benicar to 40 mg daily.

## 2015-02-27 NOTE — Telephone Encounter (Signed)
Pt was seen today by Dr Marvel Plan.

## 2015-02-27 NOTE — Assessment & Plan Note (Addendum)
Lab Results  Component Value Date   HGBA1C 6.8 02/27/2015   HGBA1C 6.2 08/23/2014   HGBA1C 6.4 04/26/2014     Assessment: Diabetes control: At goal, less than 7.  Progress toward A1C goal:   Deteriorated Comments: Trial off of Metformin 500 mg daily. A1c has increased from 6.2>6.8.   Plan: Medications:  Restart Metformin 500 mg daily.  Instruction/counseling given: discussed the need for weight loss and discussed diet Other plans: Recheck in 3 months. Referral to Dr. Rex Kras, Ophthalmology

## 2015-02-28 NOTE — Progress Notes (Signed)
Internal Medicine Clinic Attending  I saw and evaluated the patient.  I personally confirmed the key portions of the history and exam documented by Dr. Richardson and I reviewed pertinent patient test results.  The assessment, diagnosis, and plan were formulated together and I agree with the documentation in the resident's note. 

## 2015-02-28 NOTE — Addendum Note (Signed)
Addended by: Lalla Brothers T on: 02/28/2015 02:14 PM   Modules accepted: Level of Service

## 2015-03-14 ENCOUNTER — Telehealth: Payer: Self-pay

## 2015-03-14 NOTE — Telephone Encounter (Signed)
Pt called reporting cough, runny nose, and eyes x2 days, she reports the sputum varies from clear to yellow.  Pt without fever/chills, no nausea, or vomiting.  She reports doing salt water gargles, taking a dose of Benedryl, and Dayquil.  Advised to be careful with OTC cold medications because of her hypertension, advised pt to continue increasing fluids and getting plenty of rest.  Pt thankful for advice stating she really wouldn't like to come in feeling this way.  Advised pt that her PCP would see this notation and would get in touch with any other suggestions.  Pt aware to seek immediate attention in the event of chest pain, SOB, severe nausea/vomiting.  Pt will call us early to be added to schedule if symptoms not resolved in few days.

## 2015-03-15 MED ORDER — FLUTICASONE PROPIONATE 50 MCG/ACT NA SUSP: 2.0000 | Freq: Every day | NASAL | Status: DC

## 2015-03-15 MED ORDER — CHLORPHENIRAMINE-DM 4-30 MG PO TABS: 1.0000 | ORAL_TABLET | Freq: Four times a day (QID) | ORAL | Status: DC | PRN

## 2015-03-15 MED ORDER — GUAIFENESIN ER 600 MG PO TB12: 600.0000 mg | ORAL_TABLET | Freq: Two times a day (BID) | ORAL | Status: DC

## 2015-03-15 NOTE — Telephone Encounter (Signed)
Called [name] regarding her symptoms. Patient complains of a 2 day history of runny nose, nasal congestion, sinus pressure, productive cough of yellow sputum, fatigue and increased wheezing. She denies fever, chills, nausea, vomiting, shortness of breath. She has been using salt water gargles, her breathing treatments and drinking plenty of water. I feel her symptoms are consistent with a URI. I do not feel she is in an asthma exacerbation. I will send in symptomatic treatments to her pharmacy including flonase, mucinex, and coricidin cough and cold. Patient should continue to get plenty of rest, drinking fluids, and use her nebulizer machines. If her breathing was to worsen, she should go to the ED.  Osa Craver, DO PGY-2 Internal Medicine Resident Pager # [phone] 03/15/2015 9:47 AM

## 2015-03-28 DIAGNOSIS — C50912 Malignant neoplasm of unspecified site of left female breast: Secondary | ICD-10-CM | POA: Diagnosis not present

## 2015-04-11 DIAGNOSIS — H43811 Vitreous degeneration, right eye: Secondary | ICD-10-CM | POA: Diagnosis not present

## 2015-04-11 DIAGNOSIS — H2513 Age-related nuclear cataract, bilateral: Secondary | ICD-10-CM | POA: Diagnosis not present

## 2015-04-11 DIAGNOSIS — E119 Type 2 diabetes mellitus without complications: Secondary | ICD-10-CM | POA: Diagnosis not present

## 2015-04-11 DIAGNOSIS — H52203 Unspecified astigmatism, bilateral: Secondary | ICD-10-CM | POA: Diagnosis not present

## 2015-04-11 DIAGNOSIS — H25013 Cortical age-related cataract, bilateral: Secondary | ICD-10-CM | POA: Diagnosis not present

## 2015-04-11 LAB — HM DIABETES EYE EXAM

## 2015-04-29 ENCOUNTER — Other Ambulatory Visit: Payer: Self-pay | Admitting: Internal Medicine

## 2015-04-29 DIAGNOSIS — E785 Hyperlipidemia, unspecified: Secondary | ICD-10-CM

## 2015-04-29 NOTE — Telephone Encounter (Signed)
[name], [place] ORDER [phone]

## 2015-04-30 ENCOUNTER — Encounter: Payer: Self-pay | Admitting: Internal Medicine

## 2015-04-30 ENCOUNTER — Ambulatory Visit (INDEPENDENT_AMBULATORY_CARE_PROVIDER_SITE_OTHER): Payer: Commercial Managed Care - HMO | Admitting: Internal Medicine

## 2015-04-30 VITALS — BP 167/88 | HR 76 | Temp 98.2°F | Wt 234.9 lb

## 2015-04-30 DIAGNOSIS — M1711 Unilateral primary osteoarthritis, right knee: Secondary | ICD-10-CM

## 2015-04-30 DIAGNOSIS — M25561 Pain in right knee: Secondary | ICD-10-CM

## 2015-04-30 MED ORDER — PANTOPRAZOLE SODIUM 40 MG PO TBEC: 40.0000 mg | DELAYED_RELEASE_TABLET | Freq: Every day | ORAL | Status: DC

## 2015-04-30 MED ORDER — LETROZOLE 2.5 MG PO TABS: 2.5000 mg | ORAL_TABLET | Freq: Every day | ORAL | Status: DC

## 2015-04-30 MED ORDER — SIMVASTATIN 20 MG PO TABS: ORAL_TABLET | ORAL | Status: DC

## 2015-04-30 MED ORDER — SPIRONOLACTONE 25 MG PO TABS: 25.0000 mg | ORAL_TABLET | Freq: Every day | ORAL | Status: DC

## 2015-04-30 MED ORDER — LORAZEPAM 1 MG PO TABS: 1.0000 mg | ORAL_TABLET | ORAL | Status: DC | PRN

## 2015-04-30 NOTE — Progress Notes (Signed)
PROCEDURE NOTE  PROCEDURE: right knee joint steroid injection.  PREOPERATIVE DIAGNOSIS: Osteoarthritis of the right knee.  POSTOPERATIVE DIAGNOSIS: Osteoarthritis of the right knee.  PROCEDURE: The patient was apprised of the risks and the benefits of the procedure and informed consent was obtained, as witnessed by [employer]. Time-out procedure was performed, with confirmation of the patient's name, date of birth, and correct identification of the right knee to be injected. The patient's knee was then marked at the appropriate site for injection placement. The knee was sterilely prepped with Betadine. A 40 mg (1 milliliter) solution of Kenalog was drawn up into a 2.5 mL syringe with a 1 mL of 1% lidocaine. The patient was injected with a 22-gauge needle at the anterior-lateral aspect of her right flexed knee. There were no complications. The patient tolerated the procedure well. There was minimal bleeding. The patient was instructed to ice her knee upon leaving clinic and refrain from overuse over the next 3 days. The patient was instructed to go to the emergency room with any usual pain, swelling, or redness occurred in the injected area. The patient was given a followup appointment to evaluate response to the injection to his increased range of motion and reduction of pain.  The procedure was supervised by attending physician, Dr. Dareen Piano.

## 2015-04-30 NOTE — Patient Instructions (Signed)
-   We will schedule you an appointment as soon as possible for you to get a steroid injection for your knee - In the meantime, you can try hot compresses or use Tylenol as needed   General Instructions:   Please bring your medicines with you each time you come to clinic.  Medicines may include prescription medications, over-the-counter medications, herbal remedies, eye drops, vitamins, or other pills.   Progress Toward Treatment Goals:  Treatment Goal 08/23/2014  Hemoglobin A1C at goal  Blood pressure at goal    Self Care Goals & Plans:  Self Care Goal 02/27/2015  Manage my medications bring my medications to every visit; take my medicines as prescribed; refill my medications on time  Monitor my health keep track of my blood glucose; bring my glucose meter and log to each visit; keep track of my blood pressure; check my feet daily  Eat healthy foods eat more vegetables; eat foods that are low in salt; eat baked foods instead of fried foods  Be physically active find an activity I enjoy  Meeting treatment goals -    Home Blood Glucose Monitoring 08/23/2014  Check my blood sugar once a day  When to check my blood sugar before breakfast     Care Management & Community Referrals:  Referral 08/23/2014  Referrals made for care management support none needed  Referrals made to community resources none

## 2015-04-30 NOTE — Progress Notes (Signed)
   Subjective:    Patient ID: [name], female    DOB: 21-Nov-1950, 65 y.o.   MRN: [id number]  HPI [name] is a 65yo woman with PMHx of HTN, type 2 DM, CKD stage 3, hepatitis C, and HIV who presents today with right sided knee pain and swelling.  She reports a 5 year hx of right sided knee pain and has received 2 cortisone injections previously which relieved her pain. Her last cortisone injection was about 2 years ago. She states she was doing fine since then until about 2 weeks ago she started to have recurrent pain. She describes the pain as behind the knee cap, worse at night, when sitting for long periods of time, and with bending of the knee. She also describes her knee popping when it goes from a bent position to straight. She notes associated stiffness. She denies any pain or difficulty with walking. She also notes a numbing/tingling pain occasionally at night that goes down the lateral aspect of her leg. She reports tingling/numbness in her bilateral feet as well. She denies any falls or trauma that precipitated the pain. She has not taken any OTC medications to help with pain relief.    Review of Systems General: Denies fever, chills, night sweats, changes in weight, changes in appetite HEENT: Denies headaches, ear pain, changes in vision, rhinorrhea, sore throat CV: Denies CP, palpitations, SOB, orthopnea Pulm: Denies SOB, cough, wheezing GI: Denies abdominal pain, nausea, vomiting, diarrhea, constipation, melena, hematochezia GU: Denies dysuria, hematuria, frequency Msk: Denies muscle cramps Neuro: Denies weakness Skin: Denies rashes, bruising Psych: Denies depression, anxiety, hallucinations    Objective:   Physical Exam General: alert, pleasant, sitting up, NAD Ext: Her right knee appears slightly more swollen compared to the left knee. No effusion present. No erythema present. No significant tenderness to palpation of the knee. There is crepitus when she extends  her right knee.  Neuro: alert and oriented x 3, strength intact     Assessment & Plan:  Please refer to A&P documentation.

## 2015-05-01 DIAGNOSIS — M1711 Unilateral primary osteoarthritis, right knee: Secondary | ICD-10-CM | POA: Insufficient documentation

## 2015-05-01 NOTE — Assessment & Plan Note (Signed)
She has no prior imaging of her right knee but her symptoms and physical exam seem most consistent with osteoarthritis. I have asked Dr. Heber [facility] to perform a steroid injection of her right knee since this has relieved her pain in the past. The patient was informed of the risks of this procedure. Can consider getting an x-ray of her knee in the future to confirm diagnosis.

## 2015-05-02 DIAGNOSIS — C50911 Malignant neoplasm of unspecified site of right female breast: Secondary | ICD-10-CM | POA: Diagnosis not present

## 2015-05-02 DIAGNOSIS — C50912 Malignant neoplasm of unspecified site of left female breast: Secondary | ICD-10-CM | POA: Diagnosis not present

## 2015-05-03 ENCOUNTER — Encounter: Payer: Self-pay | Admitting: *Deleted

## 2015-05-06 NOTE — Progress Notes (Signed)
Internal Medicine Clinic Attending  I saw and evaluated the patient.  I personally confirmed the key portions of the history and exam documented by Dr. Arcelia Jew and I reviewed pertinent patient test results.  The assessment, diagnosis, and plan were formulated together and I agree with the documentation in the resident's note.  I was present for the knee injection and supervised Dr. Heber [place]. No immediate complications noted.

## 2015-05-29 ENCOUNTER — Ambulatory Visit (INDEPENDENT_AMBULATORY_CARE_PROVIDER_SITE_OTHER): Payer: Commercial Managed Care - HMO | Admitting: Internal Medicine

## 2015-05-29 ENCOUNTER — Encounter: Payer: Self-pay | Admitting: Internal Medicine

## 2015-05-29 VITALS — BP 145/76 | HR 105 | Temp 97.8°F | Wt 231.3 lb

## 2015-05-29 DIAGNOSIS — I129 Hypertensive chronic kidney disease with stage 1 through stage 4 chronic kidney disease, or unspecified chronic kidney disease: Secondary | ICD-10-CM | POA: Diagnosis not present

## 2015-05-29 DIAGNOSIS — I10 Essential (primary) hypertension: Secondary | ICD-10-CM

## 2015-05-29 DIAGNOSIS — N183 Chronic kidney disease, stage 3 unspecified: Secondary | ICD-10-CM

## 2015-05-29 DIAGNOSIS — Z23 Encounter for immunization: Secondary | ICD-10-CM

## 2015-05-29 DIAGNOSIS — E1122 Type 2 diabetes mellitus with diabetic chronic kidney disease: Secondary | ICD-10-CM

## 2015-05-29 DIAGNOSIS — D509 Iron deficiency anemia, unspecified: Secondary | ICD-10-CM

## 2015-05-29 DIAGNOSIS — E119 Type 2 diabetes mellitus without complications: Secondary | ICD-10-CM

## 2015-05-29 DIAGNOSIS — Z7984 Long term (current) use of oral hypoglycemic drugs: Secondary | ICD-10-CM

## 2015-05-29 DIAGNOSIS — M1711 Unilateral primary osteoarthritis, right knee: Secondary | ICD-10-CM

## 2015-05-29 DIAGNOSIS — Z79899 Other long term (current) drug therapy: Secondary | ICD-10-CM

## 2015-05-29 DIAGNOSIS — Z0000 Encounter for general adult medical examination without abnormal findings: Secondary | ICD-10-CM

## 2015-05-29 LAB — POCT GLYCOSYLATED HEMOGLOBIN (HGB A1C): Hemoglobin A1C: 7

## 2015-05-29 LAB — GLUCOSE, CAPILLARY: Glucose-Capillary: 120 mg/dL — ABNORMAL HIGH (ref 65–99)

## 2015-05-29 MED ORDER — HYDRALAZINE HCL 25 MG PO TABS: 25.0000 mg | ORAL_TABLET | Freq: Three times a day (TID) | ORAL | Status: DC

## 2015-05-29 NOTE — Progress Notes (Signed)
Subjective:    Patient ID: [name], female    DOB: 1951-02-27, 65 y.o.   MRN: [id number]  HPI [name] is a 65 y.o. female with PMHx of chronic diastolic CHF, HTN, OSA, and T2DM who presents to the clinic for follow up for T2DM, and HTN. Please see A&P for the status of the patient's chronic medical problems.   Past Medical History  Diagnosis Date  . Depression     seasonal melancholia  . Hyperlipidemia   . Hypertension   . Myopathy     not statin related, cause unknown   . Back pain   . Edema extremities   . Chemotherapy follow-up examination 03/06/2011  . Cough 03/06/2011  . Arthritis     DJD, lumbar spondylosis  . Bronchitis 03/10/2012  . Asthma   . GERD (gastroesophageal reflux disease)   . Cataract   . Breast cancer ([place])     left breast invasive ductal ca in remission as of 10/07/11 s/p double mastectomy  . E. coli UTI   . Ileitis   . Kidney stones     non obstructive   . Bilateral renal cysts   . Colon polyps     Outpatient Encounter Prescriptions as of 05/29/2015  Medication Sig Note  . albuterol (PROVENTIL HFA;VENTOLIN HFA) 108 (90 BASE) MCG/ACT inhaler Inhale 1-2 puffs into the lungs every 6 (six) hours as needed for wheezing or shortness of breath.   . Alcohol Swabs (B-D SINGLE USE SWABS REGULAR) PADS 1 application by Does not apply route 1 day or 1 dose. Use 1 time daily to check blood sugar. diag code E11.9. Non insulin dependent   . aspirin 81 MG tablet Take 81 mg by mouth daily.   . Blood Glucose Monitoring Suppl (PRODIGY VOICE BLOOD GLUCOSE) W/DEVICE KIT 1 Device by Does not apply route daily.   . Blood Pressure KIT 1 Device by Does not apply route daily.   . carvedilol (COREG) 12.5 MG tablet Take 2 tablets (25 mg total) by mouth 2 (two) times daily with a meal.   . Chlorpheniramine-DM 4-30 MG TABS Take 1 tablet by mouth every 6 (six) hours as needed.   . cholecalciferol (VITAMIN D) 1000 UNITS tablet Take 1,000 Units by mouth daily.   .  ferrous sulfate 325 (65 FE) MG tablet Take 1 tablet (325 mg total) by mouth 3 (three) times daily with meals.   . fluticasone (FLONASE) 50 MCG/ACT nasal spray Place 2 sprays into both nostrils daily.   . Fluticasone-Salmeterol (ADVAIR) 100-50 MCG/DOSE AEPB Inhale 1 puff into the lungs 2 (two) times daily as needed (wheezing).   Marland Kitchen guaiFENesin (MUCINEX) 600 MG 12 hr tablet Take 1 tablet (600 mg total) by mouth 2 (two) times daily.   . hydrALAZINE (APRESOLINE) 25 MG tablet Take 1 tablet (25 mg total) by mouth 3 (three) times daily.   . hydrocortisone (ANUSOL-HC) 2.5 % rectal cream Place 1 application rectally 2 (two) times daily.   Marland Kitchen letrozole (FEMARA) 2.5 MG tablet Take 1 tablet (2.5 mg total) by mouth daily.   Marland Kitchen loratadine (CLARITIN) 10 MG tablet Take 1 tablet (10 mg total) by mouth daily.   Marland Kitchen LORazepam (ATIVAN) 1 MG tablet Take 1 tablet (1 mg total) by mouth as needed. This is a 30 day supply   . metFORMIN (GLUCOPHAGE) 500 MG tablet Take 1 tablet (500 mg total) by mouth daily with breakfast.   . Multiple Vitamin (MULTIVITAMIN) tablet Take 1 tablet by mouth  daily.    . olmesartan (BENICAR) 20 MG tablet Take 1 tablet (20 mg total) by mouth daily.   . ondansetron (ZOFRAN) 4 MG tablet Take 1 tablet (4 mg total) by mouth every 8 (eight) hours as needed for nausea or vomiting.   . pantoprazole (PROTONIX) 40 MG tablet Take 1 tablet (40 mg total) by mouth daily.   Marland Kitchen PRODIGY NO CODING BLOOD GLUC test strip 1 each by Other route daily.   . Simethicone 125 MG CAPS Take 1 capsule (125 mg total) by mouth 4 (four) times daily as needed (gas, bloating).   . simvastatin (ZOCOR) 20 MG tablet TAKE 1 TABLET (20 MG TOTAL) BY MOUTH AT BEDTIME.   . sodium chloride (OCEAN) 0.65 % SOLN nasal spray Place 1 spray into both nostrils 4 (four) times daily. (Patient taking differently: Place 1 spray into both nostrils 4 (four) times daily as needed. )   . spironolactone (ALDACTONE) 25 MG tablet Take 1 tablet (25 mg total) by  mouth daily.   . traMADol (ULTRAM) 50 MG tablet as needed. 12/19/2014: Received from: External Pharmacy Received Sig:   . triamcinolone cream (KENALOG) 0.1 % Apply topically 2 (two) times daily as needed.   . venlafaxine (EFFEXOR) 37.5 MG tablet Take 1 tablet (37.5 mg total) by mouth daily. (Patient taking differently: Take 37.5 mg by mouth daily as needed. )   . [DISCONTINUED] hydrALAZINE (APRESOLINE) 10 MG tablet Take 1 tablet by mouth 2 (two) times daily. 12/19/2014: Received from: External Pharmacy Received Sig:    Facility-Administered Encounter Medications as of 05/29/2015  Medication  . sodium chloride 0.9 % injection 10 mL    Family History  Problem Relation Age of Onset  . Heart disease Mother 26    CAD  . Breast cancer Mother   . Cancer Mother 60    Breast cancer, mother  . Cancer Father 31    colon cancer dx'ed age 76  . Coronary artery disease Other   . Multiple sclerosis Daughter   . Diabetes Father   . Diabetes Brother   . Diabetes Brother     Social History   Social History  . Marital Status: Single    Spouse Name: N/A  . Number of Children: N/A  . Years of Education: N/A   Occupational History  . Not on file.   Social History Main Topics  . Smoking status: Never Smoker   . Smokeless tobacco: Never Used  . Alcohol Use: No  . Drug Use: No  . Sexual Activity: Not on file   Other Topics Concern  . Not on file   Social History Narrative   Single, 2 kids   Never smoked   No alcohol use   No drugs   Laid off from [employer] after working there for 14 years.     Review of Systems General: Denies fever, chills, fatigue, change in appetite Respiratory: Denies SOB, cough, DOE, chest tightness   Cardiovascular: Denies chest pain and palpitations.  Gastrointestinal: Denies nausea, vomiting, abdominal pain Musculoskeletal: Denies myalgias, joint swelling, arthralgias and gait problem.  Neurological: Denies dizziness, headaches, weakness,  lightheadedness    Objective:   Physical Exam Filed Vitals:   05/29/15 1326  BP: 145/76  Pulse: 105  Temp: 97.8 F (36.6 C)  TempSrc: Oral  Weight: 104.917 kg (231 lb 4.8 oz)  SpO2: 100%   General: Vital signs reviewed.  Patient is well-developed and well-nourished, in no acute distress and cooperative with exam.  Cardiovascular:  RRR, S1 normal, S2 normal Pulmonary/Chest: Clear to auscultation bilaterally, no wheezes, rales, or rhonchi.  Extremities: No lower extremity edema bilaterally, pulses symmetric and intact bilaterally.  Skin: Warm, dry and intact.  Psychiatric: Normal mood and affect. speech and behavior is normal.     Assessment & Plan:   Please see problem based assessment and plan.

## 2015-05-29 NOTE — Patient Instructions (Signed)
Great job exercising! You should proud of yourself for being active and losing weight!  I have increased your hydralazine to 25 mg three times a day. If you begin to feel lightheaded or your blood pressure is lower than 90/60, please let us know.   Please follow up in 3 months.

## 2015-05-29 NOTE — Assessment & Plan Note (Addendum)
BP Readings from Last 3 Encounters:  05/29/15 145/76  04/30/15 167/88  02/27/15 146/81    Lab Results  Component Value Date   NA 144 05/29/2015   K 4.7 05/29/2015   CREATININE 1.55* 05/29/2015    Assessment: Blood pressure control:  Just above goal, when compared with home BP readings, patient BP continues to be in the 140-150s Progress toward BP goal:   Improved Comments: Patient has been taking Coreg 25 mg BID, olmesartan 20 mg daily, spironolactone 25 mg daily and hydralazine 10 mg TID  Plan: Medications:  continue current medications and increase to hydralazine to 25 mg TID. Educational resources provided:   Self management tools provided:   Other plans: Check bmet for potassium- potassium normal at 4.7. If BP continues to be uncontrolled, could consider increasing olmesartan to 40 mg, but would be careful of hyperkalemia.

## 2015-05-30 DIAGNOSIS — D638 Anemia in other chronic diseases classified elsewhere: Secondary | ICD-10-CM | POA: Insufficient documentation

## 2015-05-30 LAB — BMP8+ANION GAP
Anion Gap: 20 mmol/L — ABNORMAL HIGH (ref 10.0–18.0)
BUN/Creatinine Ratio: 12 (ref 11–26)
BUN: 19 mg/dL (ref 8–27)
CO2: 21 mmol/L (ref 18–29)
Calcium: 10.2 mg/dL (ref 8.7–10.3)
Chloride: 103 mmol/L (ref 96–106)
Creatinine, Ser: 1.55 mg/dL — ABNORMAL HIGH (ref 0.57–1.00)
GFR calc Af Amer: 40 mL/min/{1.73_m2} — ABNORMAL LOW (ref >59)
GFR calc non Af Amer: 35 mL/min/{1.73_m2} — ABNORMAL LOW (ref >59)
Glucose: 102 mg/dL — ABNORMAL HIGH (ref 65–99)
Potassium: 4.7 mmol/L (ref 3.5–5.2)
Sodium: 144 mmol/L (ref 134–144)

## 2015-05-30 NOTE — Assessment & Plan Note (Signed)
Repeat BMET reveals creatinine of 1.5. Her baseline has been stable between 1.2 to 1.6 since 2007.   Plan; -Continue to monitor intermittently -Continue improve BP and diabetes control

## 2015-05-30 NOTE — Assessment & Plan Note (Signed)
Patient states her right knee pain has resolved after her corticosteroid injection one month ago. She denies any pain with ambulation or joint swelling.   Plan: -Continue to monitor -Consider repeat injection if pain returns in the future

## 2015-05-30 NOTE — Assessment & Plan Note (Signed)
Lab Results  Component Value Date   HGBA1C 7.0 05/29/2015   HGBA1C 6.8 02/27/2015   HGBA1C 6.2 08/23/2014     Assessment: Diabetes control:  Controlled Progress toward A1C goal:    Deteriorated Comments: Patient reports compliance with Metformin 500 mg QAM. She also has been walking a mile at the [facility] a few times a week with her daughter. She has lost 4 pounds since February.  Plan: Medications:  continue current medications Instruction/counseling given: discussed the need for weight loss and discussed diet Educational resources provided:   Self management tools provided:   Other plans: Recheck in A1c in 3 months. If A1c continues to rise above 7.0, would increase Metformin to 500 mg BID.

## 2015-05-30 NOTE — Assessment & Plan Note (Signed)
-  Up to date on breast cancer and colon cancer screening -Up to date on DEXA -Received PCV 23 today

## 2015-05-30 NOTE — Assessment & Plan Note (Signed)
Patient with previous diagnosis of iron deficiency anemia on supplementation with ferrous sulfate. Did not have time to properly address today. At follow up visit, consider repeat iron studies and assess if patient is still taking iron supplementation.

## 2015-05-31 NOTE — Progress Notes (Signed)
Internal Medicine Clinic Attending  Case discussed with Dr. Burns soon after the resident saw the patient.  We reviewed the resident's history and exam and pertinent patient test results.  I agree with the assessment, diagnosis, and plan of care documented in the resident's note. 

## 2015-06-03 DIAGNOSIS — C50912 Malignant neoplasm of unspecified site of left female breast: Secondary | ICD-10-CM | POA: Diagnosis not present

## 2015-06-03 DIAGNOSIS — C50911 Malignant neoplasm of unspecified site of right female breast: Secondary | ICD-10-CM | POA: Diagnosis not present

## 2015-06-20 ENCOUNTER — Other Ambulatory Visit: Payer: Self-pay | Admitting: *Deleted

## 2015-06-20 NOTE — Telephone Encounter (Signed)
Patient called, needing refill on medication. Lost letter from [place].  Information given to Leigh. Leigh to call patient and find out from [facility] what medication is needed.

## 2015-06-20 NOTE — Telephone Encounter (Signed)
Spoke with [facility], pt requested 2 refills too soon after just receiving them and they believe this to be the only reason she would have rec'd a letter from them.  They state femara and hydralazine both refilled for 90 day supply in Feb.  I called patient but had to LVM.  Advised to disregard letter and no need to call back to unless she has a need for specific medication refill now.

## 2015-06-25 ENCOUNTER — Telehealth: Payer: Self-pay

## 2015-06-25 NOTE — Telephone Encounter (Signed)
Pt needing refills on benicar and metformin- per medication history should have refills on file with [facility] to inquire on last refills and number refills available    Benicar last refill 1/12 for 90 days has 2 remaining refills Metformin last refill 3/1 for 90 days has 2 refills   Spoke with patient to advise

## 2015-07-30 ENCOUNTER — Ambulatory Visit (HOSPITAL_BASED_OUTPATIENT_CLINIC_OR_DEPARTMENT_OTHER): Payer: Commercial Managed Care - HMO | Admitting: Hematology and Oncology

## 2015-07-30 ENCOUNTER — Encounter: Payer: Self-pay | Admitting: Hematology and Oncology

## 2015-07-30 ENCOUNTER — Telehealth: Payer: Self-pay | Admitting: Hematology and Oncology

## 2015-07-30 VITALS — BP 146/77 | HR 79 | Temp 98.0°F | Resp 18 | Ht 68.0 in | Wt 231.4 lb

## 2015-07-30 DIAGNOSIS — C50412 Malignant neoplasm of upper-outer quadrant of left female breast: Secondary | ICD-10-CM

## 2015-07-30 NOTE — Progress Notes (Signed)
Patient Care Team: Florinda Marker, MD as PCP - General (Internal Medicine) Jolaine Artist, MD as Referring Physician (Cardiology) Rutherford Guys, MD as Consulting Physician (Ophthalmology)  DIAGNOSIS: Breast cancer of upper-outer quadrant of left female breast [facility])   Staging form: Breast, AJCC 7th Edition     Clinical: Stage IA (T1c, N0, cM0) - Unsigned     Pathologic: No stage assigned - Unsigned  SUMMARY OF ONCOLOGIC HISTORY:   Breast cancer of upper-outer quadrant of left female breast ([place])   01/15/2010 Surgery  bilateral mastectomies: left breast IDC , ER/PR positive HER-2 positive  stage II a;  right breast benign   04/17/2010 - 04/17/2011 Chemotherapy weekly Taxotere carboplatinum and Herceptin x 6 cycles  followed by Herceptin maintenance   09/01/2010 -  Anti-estrogen oral therapy  letrozole 2.5 mg daily   CHIEF COMPLIANT: follow-up on letrozole for breast cancer  INTERVAL HISTORY: [name] is a 65 year old of above-mentioned history of left breast cancer underwent bilateral mastectomies followed by adjuvant chemotherapy with Herceptin and has been on letrozole for the past 5 years. She has tolerated letrozole fairly well except for hot flashes. Did not want to take any medication for the hot flashes and had been dealing with them. Apart from this she is been fairly healthy and without any other medical problems.  REVIEW OF SYSTEMS:   Constitutional: Denies fevers, chills or abnormal weight loss Eyes: Denies blurriness of vision Ears, nose, mouth, throat, and face: Denies mucositis or sore throat Respiratory: Denies cough, dyspnea or wheezes Cardiovascular: Denies palpitation, chest discomfort Gastrointestinal:  Denies nausea, heartburn or change in bowel habits Skin: Denies abnormal skin rashes Lymphatics: Denies new lymphadenopathy or easy bruising Neurological:Denies numbness, tingling or new weaknesses Behavioral/Psych: Mood is stable, no new changes    Extremities: No lower extremity edema Breast:  denies any pain or lumps or nodules in either breasts All other systems were reviewed with the patient and are negative.  I have reviewed the past medical history, past surgical history, social history and family history with the patient and they are unchanged from previous note.  ALLERGIES:  is allergic to atorvastatin; hctz; norvasc; and rosuvastatin.  MEDICATIONS:  Current Outpatient Prescriptions  Medication Sig Dispense Refill  . albuterol (PROVENTIL HFA;VENTOLIN HFA) 108 (90 BASE) MCG/ACT inhaler Inhale 1-2 puffs into the lungs every 6 (six) hours as needed for wheezing or shortness of breath. 18 g 2  . Alcohol Swabs (B-D SINGLE USE SWABS REGULAR) PADS 1 application by Does not apply route 1 day or 1 dose. Use 1 time daily to check blood sugar. diag code E11.9. Non insulin dependent 90 each 6  . aspirin 81 MG tablet Take 81 mg by mouth daily.    . Blood Glucose Monitoring Suppl (PRODIGY VOICE BLOOD GLUCOSE) W/DEVICE KIT 1 Device by Does not apply route daily. 1 kit 3  . Blood Pressure KIT 1 Device by Does not apply route daily. 1 each 0  . carvedilol (COREG) 12.5 MG tablet Take 2 tablets (25 mg total) by mouth 2 (two) times daily with a meal. 180 tablet 0  . Chlorpheniramine-DM 4-30 MG TABS Take 1 tablet by mouth every 6 (six) hours as needed. 28 each 0  . cholecalciferol (VITAMIN D) 1000 UNITS tablet Take 1,000 Units by mouth daily.    . ferrous sulfate 325 (65 FE) MG tablet Take 1 tablet (325 mg total) by mouth 3 (three) times daily with meals. 90 tablet 6  . fluticasone (FLONASE) 50  MCG/ACT nasal spray Place 2 sprays into both nostrils daily. 16 g 2  . Fluticasone-Salmeterol (ADVAIR) 100-50 MCG/DOSE AEPB Inhale 1 puff into the lungs 2 (two) times daily as needed (wheezing). 180 each 3  . guaiFENesin (MUCINEX) 600 MG 12 hr tablet Take 1 tablet (600 mg total) by mouth 2 (two) times daily. 30 tablet 0  . hydrALAZINE (APRESOLINE) 25 MG  tablet Take 1 tablet (25 mg total) by mouth 3 (three) times daily. 270 tablet 3  . hydrocortisone (ANUSOL-HC) 2.5 % rectal cream Place 1 application rectally 2 (two) times daily. 30 g 3  . letrozole (FEMARA) 2.5 MG tablet Take 1 tablet (2.5 mg total) by mouth daily. 90 tablet 3  . loratadine (CLARITIN) 10 MG tablet Take 1 tablet (10 mg total) by mouth daily. 30 tablet 2  . LORazepam (ATIVAN) 1 MG tablet Take 1 tablet (1 mg total) by mouth as needed. This is a 30 day supply 30 tablet 5  . metFORMIN (GLUCOPHAGE) 500 MG tablet Take 1 tablet (500 mg total) by mouth daily with breakfast. 90 tablet 3  . Multiple Vitamin (MULTIVITAMIN) tablet Take 1 tablet by mouth daily.     Marland Kitchen olmesartan (BENICAR) 20 MG tablet Take 1 tablet (20 mg total) by mouth daily. 90 tablet 3  . ondansetron (ZOFRAN) 4 MG tablet Take 1 tablet (4 mg total) by mouth every 8 (eight) hours as needed for nausea or vomiting. 20 tablet 0  . pantoprazole (PROTONIX) 40 MG tablet Take 1 tablet (40 mg total) by mouth daily. 90 tablet 3  . PRODIGY NO CODING BLOOD GLUC test strip 1 each by Other route daily. 100 each 11  . Simethicone 125 MG CAPS Take 1 capsule (125 mg total) by mouth 4 (four) times daily as needed (gas, bloating). 90 each 2  . simvastatin (ZOCOR) 20 MG tablet TAKE 1 TABLET (20 MG TOTAL) BY MOUTH AT BEDTIME. 90 tablet 3  . sodium chloride (OCEAN) 0.65 % SOLN nasal spray Place 1 spray into both nostrils 4 (four) times daily. (Patient taking differently: Place 1 spray into both nostrils 4 (four) times daily as needed. ) 2 Bottle 0  . spironolactone (ALDACTONE) 25 MG tablet Take 1 tablet (25 mg total) by mouth daily. 90 tablet 3  . traMADol (ULTRAM) 50 MG tablet as needed.    . triamcinolone cream (KENALOG) 0.1 % Apply topically 2 (two) times daily as needed. 454 g 0  . venlafaxine (EFFEXOR) 37.5 MG tablet Take 1 tablet (37.5 mg total) by mouth daily. (Patient taking differently: Take 37.5 mg by mouth daily as needed. ) 90 tablet 1     No current facility-administered medications for this visit.   Facility-Administered Medications Ordered in Other Visits  Medication Dose Route Frequency Provider Last Rate Last Dose  . sodium chloride 0.9 % injection 10 mL  10 mL Intravenous PRN Consuela Mimes, MD   10 mL at 01/26/11 1035    PHYSICAL EXAMINATION: ECOG PERFORMANCE STATUS: 1 - Symptomatic but completely ambulatory  Filed Vitals:   07/30/15 1025  BP: 146/77  Pulse: 79  Temp: 98 F (36.7 C)  Resp: 18   Filed Weights   07/30/15 1025  Weight: 231 lb 6.4 oz (104.962 kg)    GENERAL:alert, no distress and comfortable SKIN: skin color, texture, turgor are normal, no rashes or significant lesions EYES: normal, Conjunctiva are pink and non-injected, sclera clear OROPHARYNX:no exudate, no erythema and lips, buccal mucosa, and tongue normal  NECK: supple, thyroid normal size,  non-tender, without nodularity LYMPH:  no palpable lymphadenopathy in the cervical, axillary or inguinal LUNGS: clear to auscultation and percussion with normal breathing effort HEART: regular rate & rhythm and no murmurs and no lower extremity edema ABDOMEN:abdomen soft, non-tender and normal bowel sounds MUSCULOSKELETAL:no cyanosis of digits and no clubbing  NEURO: alert & oriented x 3 with fluent speech, no focal motor/sensory deficits EXTREMITIES: No lower extremity edema BREAST: no palpable lumps nodules in bilateral chest wall areas or axilla. (exam performed in the presence of a chaperone)  LABORATORY DATA:  I have reviewed the data as listed   Chemistry      Component Value Date/Time   NA 144 05/29/2015 1420   NA 144 09/26/2014 1420   NA 144 07/31/2014 1006   K 4.7 05/29/2015 1420   K 4.7 07/31/2014 1006   CL 103 05/29/2015 1420   CL 103 04/25/2012 1045   CO2 21 05/29/2015 1420   CO2 23 07/31/2014 1006   BUN 19 05/29/2015 1420   BUN 17 09/26/2014 1420   BUN 39.1* 07/31/2014 1006   CREATININE 1.55* 05/29/2015 1420   CREATININE  1.10 09/26/2014 1420   CREATININE 2.2* 07/31/2014 1006      Component Value Date/Time   CALCIUM 10.2 05/29/2015 1420   CALCIUM 9.6 07/31/2014 1006   ALKPHOS 64 07/31/2014 1006   ALKPHOS 56 10/12/2013 1204   AST 17 07/31/2014 1006   AST 21 10/12/2013 1204   ALT 20 07/31/2014 1006   ALT 23 10/12/2013 1204   BILITOT 0.37 07/31/2014 1006   BILITOT 0.7 10/12/2013 1204     Lab Results  Component Value Date   WBC 6.2 07/31/2014   HGB 10.2* 07/31/2014   HCT 30.6* 07/31/2014   MCV 87.4 07/31/2014   PLT 247 07/31/2014   NEUTROABS 3.1 07/31/2014   ASSESSMENT & PLAN:  Breast cancer of upper-outer quadrant of left female breast ([place]) Left breast invasive ductal carcinoma stage II a ER/PR positive HER-2 positive status post bilateral mastectomies (Right CVA, Rt IJ DVT) Followed by 6 cycles of [place] followed by Herceptin maintenance completed 04/17/2011   Current treatment: letrozole 2.5 mg daily started June 2012 Letrozole toxicities: 1. Hot flashes 2. Increased tearing in the eyes I discussed the pros and cons of extended adjuvant therapy. I recommended doing breast cancer index determined if she would benefit from continuation of antiestrogen therapy beyond 5 years. I will call her with the results of this test.  Breast Cancer Surveillance: 1. Breast exam 07/31/2014: Normal 2. No role of imaging studies since she had bilateral mastectomies 3. Bone density June 2015: normal T score +2.2   Survivorship: I encouraged her to stay active and do physical exercise regularly.  Return to clinic in 1 year for follow-up   No orders of the defined types were placed in this encounter.   The patient has a good understanding of the overall plan. she agrees with it. she will call with any problems that may develop before the next visit here.   Rulon Eisenmenger, MD 07/30/2015

## 2015-07-30 NOTE — Assessment & Plan Note (Signed)
Left breast invasive ductal carcinoma stage II a ER/PR positive HER-2 positive status post bilateral mastectomies (Right CVA, Rt IJ DVT) Followed by 6 cycles of [place] followed by Herceptin maintenance completed 04/17/2011   Current treatment: letrozole 2.5 mg daily started June 2012 Letrozole toxicities: 1. Hot flashes 2. Increased tearing in the eyes I discussed the pros and cons of extended adjuvant therapy. I recommended doing breast cancer index determined if she would benefit from continuation of antiestrogen therapy beyond 5 years. I will call her with the results of this test.  Breast Cancer Surveillance: 1. Breast exam 07/31/2014: Normal 2. No role of imaging studies since she had bilateral mastectomies 3. Bone density June 2015: normal T score +2.2   Survivorship: patient has not been doing any physical activity. I recommended that she go back to walking and joining a gym. She promised to do that and when she comes back in a year she said she would look like Charyl Bigger! Return to survivorship clinic in 1 year

## 2015-07-30 NOTE — Telephone Encounter (Signed)
appt made and avs printed °

## 2015-08-06 DIAGNOSIS — C50412 Malignant neoplasm of upper-outer quadrant of left female breast: Secondary | ICD-10-CM | POA: Diagnosis not present

## 2015-08-09 ENCOUNTER — Other Ambulatory Visit: Payer: Self-pay | Admitting: Pulmonary Disease

## 2015-08-12 ENCOUNTER — Encounter (HOSPITAL_COMMUNITY): Payer: Self-pay

## 2015-08-12 ENCOUNTER — Telehealth: Payer: Self-pay | Admitting: *Deleted

## 2015-08-12 ENCOUNTER — Telehealth: Payer: Self-pay | Admitting: Hematology and Oncology

## 2015-08-12 NOTE — Telephone Encounter (Signed)
I called the patient to discuss the result of breast cancer index. The tests came back at 10% risk of recurrence between years 5-10 However it showed that she had a low likelihood of benefit from extended adjuvant therapy.  Recommendation: Stop antiestrogen therapy in June 2017. Patient was very happy to hear this news.

## 2015-08-12 NOTE — Telephone Encounter (Signed)
Received Low risk results from The Breast Cancer Index test.  Gave Dr. Lindi Adie a copy of the results and sent one to HIM to scan.

## 2015-09-03 ENCOUNTER — Other Ambulatory Visit: Payer: Self-pay | Admitting: Internal Medicine

## 2015-09-04 ENCOUNTER — Ambulatory Visit (INDEPENDENT_AMBULATORY_CARE_PROVIDER_SITE_OTHER): Payer: Commercial Managed Care - HMO | Admitting: Internal Medicine

## 2015-09-04 VITALS — BP 154/84 | HR 79 | Temp 97.7°F | Resp 18 | Wt 238.1 lb

## 2015-09-04 DIAGNOSIS — Z7984 Long term (current) use of oral hypoglycemic drugs: Secondary | ICD-10-CM | POA: Diagnosis not present

## 2015-09-04 DIAGNOSIS — E784 Other hyperlipidemia: Secondary | ICD-10-CM

## 2015-09-04 DIAGNOSIS — E119 Type 2 diabetes mellitus without complications: Secondary | ICD-10-CM

## 2015-09-04 DIAGNOSIS — E1169 Type 2 diabetes mellitus with other specified complication: Secondary | ICD-10-CM

## 2015-09-04 DIAGNOSIS — E785 Hyperlipidemia, unspecified: Secondary | ICD-10-CM

## 2015-09-04 DIAGNOSIS — I10 Essential (primary) hypertension: Secondary | ICD-10-CM | POA: Diagnosis not present

## 2015-09-04 DIAGNOSIS — D509 Iron deficiency anemia, unspecified: Secondary | ICD-10-CM

## 2015-09-04 DIAGNOSIS — C50412 Malignant neoplasm of upper-outer quadrant of left female breast: Secondary | ICD-10-CM

## 2015-09-04 LAB — GLUCOSE, CAPILLARY: Glucose-Capillary: 101 mg/dL — ABNORMAL HIGH (ref 65–99)

## 2015-09-04 LAB — POCT GLYCOSYLATED HEMOGLOBIN (HGB A1C): Hemoglobin A1C: 6.8

## 2015-09-04 MED ORDER — HYDRALAZINE HCL 25 MG PO TABS: 25.0000 mg | ORAL_TABLET | Freq: Four times a day (QID) | ORAL | Status: DC

## 2015-09-04 NOTE — Patient Instructions (Signed)
Please increase Hydralazine to 25 mg 4 times a day.   Continue all other medications as prescribed.   Follow up in 3 months.  Please let me know if your abdominal symptoms change or your pain worsens.

## 2015-09-04 NOTE — Assessment & Plan Note (Addendum)
Patient has a previous diagnosis of iron deficiency anemia with normocytic anemia and ferritin in the 30s in the past. She was on ferrous sulfate TID. Last CBC showed stable hemoglobin at 10.2 which has been near her baseline of 10-12 since 2013. Last ferritin in 2012 was 291. Based on previous levels, patient likely has anemia of chronic disease from CKD.   Plan: -Repeat CBC -Repeat Ferritin -Retic   Addendum: Hgb 10.3, MCV 88, Ferritin 1085-- likely secondary to anemia of chronic disease. Would favor discontinuation of iron supplementation. Will add on retic count to confirm if retic count low, this would support this diagnosis.

## 2015-09-04 NOTE — Progress Notes (Signed)
Subjective:    Patient ID: [name], female    DOB: Sep 13, 1950, 65 y.o.   MRN: [id number]  HPI [name] is a 65 y.o. female with PMHx of chronic diastolic CHF, HTN, GERD, E7OJ, CKD III, h/o breast cancer and depression who presents to the clinic for follow up for T2DM. Please see A&P for the status of the patient's chronic medical problems.   Past Medical History  Diagnosis Date  . Depression     seasonal melancholia  . Hyperlipidemia   . Hypertension   . Myopathy     not statin related, cause unknown   . Back pain   . Edema extremities   . Chemotherapy follow-up examination 03/06/2011  . Cough 03/06/2011  . Arthritis     DJD, lumbar spondylosis  . Bronchitis 03/10/2012  . Asthma   . GERD (gastroesophageal reflux disease)   . Cataract   . Breast cancer ([place])     left breast invasive ductal ca in remission as of 10/07/11 s/p double mastectomy  . E. coli UTI   . Ileitis   . Kidney stones     non obstructive   . Bilateral renal cysts   . Colon polyps     Outpatient Encounter Prescriptions as of 09/04/2015  Medication Sig Note  . Alcohol Swabs (B-D SINGLE USE SWABS REGULAR) PADS 1 application by Does not apply route 1 day or 1 dose. Use 1 time daily to check blood sugar. diag code E11.9. Non insulin dependent   . aspirin 81 MG tablet Take 81 mg by mouth daily.   . Blood Glucose Monitoring Suppl (PRODIGY VOICE BLOOD GLUCOSE) W/DEVICE KIT 1 Device by Does not apply route daily.   . Blood Pressure KIT 1 Device by Does not apply route daily.   . carvedilol (COREG) 12.5 MG tablet Take 2 tablets (25 mg total) by mouth 2 (two) times daily with a meal.   . Chlorpheniramine-DM 4-30 MG TABS Take 1 tablet by mouth every 6 (six) hours as needed.   . cholecalciferol (VITAMIN D) 1000 UNITS tablet Take 1,000 Units by mouth daily.   . ferrous sulfate 325 (65 FE) MG tablet Take 1 tablet (325 mg total) by mouth 3 (three) times daily with meals.   . fluticasone (FLONASE) 50  MCG/ACT nasal spray Place 2 sprays into both nostrils daily.   . Fluticasone-Salmeterol (ADVAIR) 100-50 MCG/DOSE AEPB Inhale 1 puff into the lungs 2 (two) times daily as needed (wheezing).   . hydrALAZINE (APRESOLINE) 25 MG tablet Take 1 tablet (25 mg total) by mouth 4 (four) times daily.   . hydrocortisone (ANUSOL-HC) 2.5 % rectal cream Place 1 application rectally 2 (two) times daily.   Marland Kitchen letrozole (FEMARA) 2.5 MG tablet Take 1 tablet (2.5 mg total) by mouth daily.   Marland Kitchen loratadine (CLARITIN) 10 MG tablet Take 1 tablet (10 mg total) by mouth daily.   Marland Kitchen LORazepam (ATIVAN) 1 MG tablet Take 1 tablet (1 mg total) by mouth as needed. This is a 30 day supply   . metFORMIN (GLUCOPHAGE) 500 MG tablet Take 1 tablet (500 mg total) by mouth daily with breakfast.   . Multiple Vitamin (MULTIVITAMIN) tablet Take 1 tablet by mouth daily.    Marland Kitchen olmesartan (BENICAR) 20 MG tablet Take 1 tablet (20 mg total) by mouth daily.   . ondansetron (ZOFRAN) 4 MG tablet Take 1 tablet (4 mg total) by mouth every 8 (eight) hours as needed for nausea or vomiting.   Marland Kitchen  pantoprazole (PROTONIX) 40 MG tablet Take 1 tablet (40 mg total) by mouth daily.   Marland Kitchen PRODIGY NO CODING BLOOD GLUC test strip 1 each by Other route daily.   . Simethicone 125 MG CAPS Take 1 capsule (125 mg total) by mouth 4 (four) times daily as needed (gas, bloating).   . simvastatin (ZOCOR) 20 MG tablet TAKE 1 TABLET (20 MG TOTAL) BY MOUTH AT BEDTIME.   . sodium chloride (OCEAN) 0.65 % SOLN nasal spray Place 1 spray into both nostrils 4 (four) times daily. (Patient taking differently: Place 1 spray into both nostrils 4 (four) times daily as needed. )   . spironolactone (ALDACTONE) 25 MG tablet Take 1 tablet (25 mg total) by mouth daily.   . traMADol (ULTRAM) 50 MG tablet as needed. 12/19/2014: Received from: External Pharmacy Received Sig:   . triamcinolone cream (KENALOG) 0.1 % Apply topically 2 (two) times daily as needed.   . venlafaxine (EFFEXOR) 37.5 MG tablet  Take 1 tablet (37.5 mg total) by mouth daily. (Patient taking differently: Take 37.5 mg by mouth daily as needed. )   . VENTOLIN HFA 108 (90 Base) MCG/ACT inhaler INHALE 1 TO 2 PUFFS INTO THE LUNGS EVERY 6 (SIX) HOURS AS NEEDED FOR WHEEZING OR SHORTNESS OF BREATH.   . [DISCONTINUED] hydrALAZINE (APRESOLINE) 25 MG tablet Take 1 tablet (25 mg total) by mouth 3 (three) times daily.    Facility-Administered Encounter Medications as of 09/04/2015  Medication  . sodium chloride 0.9 % injection 10 mL    Family History  Problem Relation Age of Onset  . Heart disease Mother 38    CAD  . Breast cancer Mother   . Cancer Mother 64    Breast cancer, mother  . Cancer Father 66    colon cancer dx'[name] age 15  . Coronary artery disease Other   . Multiple sclerosis Daughter   . Diabetes Father   . Diabetes Brother   . Diabetes Brother     Social History   Social History  . Marital Status: Single    Spouse Name: N/A  . Number of Children: N/A  . Years of Education: N/A   Occupational History  . Not on file.   Social History Main Topics  . Smoking status: Never Smoker   . Smokeless tobacco: Never Used  . Alcohol Use: No  . Drug Use: No  . Sexual Activity: Not on file   Other Topics Concern  . Not on file   Social History Narrative   Single, 2 kids   Never smoked   No alcohol use   No drugs   Laid off from [employer] after working there for 14 years.    Review of Systems General: Denies fever, chills, fatigue, change in appetite.  Respiratory: Denies SOB, cough, DOE.   Cardiovascular: Denies chest pain and palpitations.  Gastrointestinal: Admits to intermittent abdominal pain. Denies nausea, vomiting, diarrhea, constipation, blood in stool and abdominal distention.  Genitourinary: Denies dysuria, urgency. Endocrine: Admits to hot flashes. Denies polyuria, and polydipsia. Skin: Denies rash and wounds.  Neurological: Denies dizziness, headaches, weakness,  lightheadedness    Objective:   Physical Exam Filed Vitals:   09/04/15 1327  BP: 154/84  Pulse: 79  Temp: 97.7 F (36.5 C)  TempSrc: Oral  Resp: 18  Weight: 238 lb 1.6 oz (108.001 kg)  SpO2: 99%   General: Vital signs reviewed.  Patient is well-developed and well-nourished, in no acute distress and cooperative with exam.  Cardiovascular:  RRR, S1 normal, S2 normal, no murmurs, gallops, or rubs. Pulmonary/Chest: Clear to auscultation bilaterally, no wheezes, rales, or rhonchi. Abdominal: Soft, non-tender, non-distended, BS +, no masses, or guarding present. Surgical scars evident in midline of lower abdomen. Extremities: 1+ pitting edema bilaterally in feet,  pulses symmetric and intact bilaterally. No cyanosis or clubbing. Skin: Warm, dry and intact. No rashes or erythema.    Assessment & Plan:   Please see problem based assessment and plan.

## 2015-09-04 NOTE — Assessment & Plan Note (Addendum)
Patient was recently seen by Dr. Lindi Adie in May 2017. Plans were made to discontinued her antiestrogen (completed on 08/27/15) as her Breast Cancer risk index was low. She has been on Letrozole since 2012.

## 2015-09-04 NOTE — Assessment & Plan Note (Addendum)
BP Readings from Last 3 Encounters:  09/04/15 154/84  07/30/15 146/77  05/29/15 145/76    Lab Results  Component Value Date   NA 144 05/29/2015   K 4.7 05/29/2015   CREATININE 1.55* 05/29/2015    Assessment: Blood pressure control:  Above goal (repeat was 144/88) Progress toward BP goal:   Deteriorated Comments: Patient is on hydralazine 25 mg TID (recently increased from 10 mg TID), Coreg 25 mg BID, olmesartan 20 mg daily, and spironolactone 25 mg daily. She is intolerant of amlodipine and HCTZ.  Plan: Medications:  Increase hydralazine to 25 mg QID. Continue all other medications the same.  Other plans: At follow up visit, if still >140/90 would consider increasing spironolactone to 50 mg daily and keeping a close eye on renal function and potassium.

## 2015-09-04 NOTE — Assessment & Plan Note (Addendum)
Lab Results  Component Value Date   HGBA1C 6.8 09/04/2015   HGBA1C 7.0 05/29/2015   HGBA1C 6.8 02/27/2015     Assessment: Diabetes control:  Controlled Progress toward A1C goal:    At goal Comments: Patient is on Metformin 500 mg daily.   Plan: Medications:  Continue current regimen.  Instruction/counseling given: discussed the need for weight loss Other plans: Follow up in 3 months.

## 2015-09-05 LAB — CBC
Hematocrit: 30.9 % — ABNORMAL LOW (ref 34.0–46.6)
Hemoglobin: 10.3 g/dL — ABNORMAL LOW (ref 11.1–15.9)
MCH: 29.2 pg (ref 26.6–33.0)
MCHC: 33.3 g/dL (ref 31.5–35.7)
MCV: 88 fL (ref 79–97)
Platelets: 220 10*3/uL (ref 150–379)
RBC: 3.53 x10E6/uL — ABNORMAL LOW (ref 3.77–5.28)
RDW: 14.3 % (ref 12.3–15.4)
WBC: 6.7 10*3/uL (ref 3.4–10.8)

## 2015-09-05 LAB — FERRITIN: Ferritin: 1085 ng/mL — ABNORMAL HIGH (ref 15–150)

## 2015-09-05 NOTE — Assessment & Plan Note (Signed)
Patient reports compliance with simvastatin 20 mg daily. Last lipid panel checked in June 2016.  Plan: -Consider repeat lipid panel at follow up visit

## 2015-09-06 NOTE — Addendum Note (Signed)
Addended by: Truddie Crumble on: 09/06/2015 10:20 AM   Modules accepted: Orders

## 2015-09-06 NOTE — Addendum Note (Signed)
Addended by: Truddie Crumble on: 09/06/2015 09:54 AM   Modules accepted: Orders

## 2015-09-06 NOTE — Progress Notes (Signed)
Internal Medicine Clinic Attending  Case discussed with Dr. Richardson soon after the resident saw the patient.  We reviewed the resident's history and exam and pertinent patient test results.  I agree with the assessment, diagnosis, and plan of care documented in the resident's note. 

## 2015-09-18 ENCOUNTER — Other Ambulatory Visit: Payer: Self-pay | Admitting: Internal Medicine

## 2015-11-11 ENCOUNTER — Ambulatory Visit (INDEPENDENT_AMBULATORY_CARE_PROVIDER_SITE_OTHER): Payer: Commercial Managed Care - HMO | Admitting: Internal Medicine

## 2015-11-11 DIAGNOSIS — M79671 Pain in right foot: Secondary | ICD-10-CM | POA: Insufficient documentation

## 2015-11-11 DIAGNOSIS — Z23 Encounter for immunization: Secondary | ICD-10-CM

## 2015-11-11 NOTE — Patient Instructions (Signed)
Thank you for your visit today  Please avoid flip flops foe few weeks  Please use appropriate precautions as you have diabetes for your feet and always wear shoes when you walk outside

## 2015-11-11 NOTE — Assessment & Plan Note (Signed)
Pt is here for 1 week duration of pain under right 2nd toe. Says it started Wednesday. She feels like a callus may be happening or there may be a blister. She has been wearing flip flops for 2 weeks  On exam, no evidence of skin breakdown, callus or blister, or tinea pedis  -Advised pt to not wear flipflops and wear closed shoes -Advised diabetic foot care

## 2015-11-11 NOTE — Progress Notes (Signed)
   CC: concern for callus under 2nd rt toe HPI: [name] is a 65 y.o. woman with PMH noted below here for callus under 2nd right toe  Please see Problem List/A&P for the status of the patient's chronic medical problems   Past Medical History:  Diagnosis Date  . Arthritis    DJD, lumbar spondylosis  . Asthma   . Back pain   . Bilateral renal cysts   . Breast cancer ([place])    left breast invasive ductal ca in remission as of 10/07/11 s/p double mastectomy  . Bronchitis 03/10/2012  . Cataract   . Chemotherapy follow-up examination 03/06/2011  . Colon polyps   . Cough 03/06/2011  . Depression    seasonal melancholia  . E. coli UTI   . Edema extremities   . GERD (gastroesophageal reflux disease)   . Hyperlipidemia   . Hypertension   . Ileitis   . Kidney stones    non obstructive   . Myopathy    not statin related, cause unknown     Review of Systems: Denies fevers, malaise Denies cough, sob Denies n/v Has some pain under right 2nd toe Has some tingling in both feet  Physical Exam: There were no vitals filed for this visit.  General: A&O, in NAD CV: RRR, normal s1, s2, no m/r/g,  Resp: equal and symmetric breath sounds, no wheezing or crackles  Abdomen: soft, nontender, nondistended, +BS Extremities: pulses intact b/l, 1+ pitting edema to both shins Right foot: Do not see any signs of skin breakdown, injury, callus, or tinea pedis   Assessment & Plan:   See encounters tab for problem based medical decision making. Patient seen with Dr. Evette Leach

## 2015-11-12 NOTE — Progress Notes (Signed)
Internal Medicine Clinic Attending  I saw and evaluated the patient.  I personally confirmed the key portions of the history and exam documented by Dr. Tiburcio Pea and I reviewed pertinent patient test results.  The assessment, diagnosis, and plan were formulated together and I agree with the documentation in the resident's note.  Diabetic patient here because of a sensation of a wound between first and second toes from flip flops. On exam no skin breakdown seen. Supportive care and regular shoes for at least 2 weeks. Return if wound appears and we can recommend wound care strategies.

## 2015-12-03 ENCOUNTER — Telehealth: Payer: Self-pay | Admitting: Internal Medicine

## 2015-12-03 NOTE — Telephone Encounter (Signed)
AP. REMINDER  CALL, LMTCB °

## 2015-12-04 ENCOUNTER — Encounter: Payer: Self-pay | Admitting: Internal Medicine

## 2015-12-04 ENCOUNTER — Ambulatory Visit (INDEPENDENT_AMBULATORY_CARE_PROVIDER_SITE_OTHER): Payer: Commercial Managed Care - HMO | Admitting: Internal Medicine

## 2015-12-04 VITALS — BP 128/84 | HR 70 | Temp 97.8°F | Wt 233.6 lb

## 2015-12-04 DIAGNOSIS — Z7984 Long term (current) use of oral hypoglycemic drugs: Secondary | ICD-10-CM

## 2015-12-04 DIAGNOSIS — M1711 Unilateral primary osteoarthritis, right knee: Secondary | ICD-10-CM

## 2015-12-04 DIAGNOSIS — E119 Type 2 diabetes mellitus without complications: Secondary | ICD-10-CM | POA: Diagnosis not present

## 2015-12-04 DIAGNOSIS — Z79899 Other long term (current) drug therapy: Secondary | ICD-10-CM

## 2015-12-04 DIAGNOSIS — Z0000 Encounter for general adult medical examination without abnormal findings: Secondary | ICD-10-CM

## 2015-12-04 DIAGNOSIS — I10 Essential (primary) hypertension: Secondary | ICD-10-CM | POA: Diagnosis not present

## 2015-12-04 DIAGNOSIS — M4806 Spinal stenosis, lumbar region: Secondary | ICD-10-CM | POA: Diagnosis not present

## 2015-12-04 LAB — GLUCOSE, CAPILLARY: Glucose-Capillary: 105 mg/dL — ABNORMAL HIGH (ref 65–99)

## 2015-12-04 LAB — POCT GLYCOSYLATED HEMOGLOBIN (HGB A1C): Hemoglobin A1C: 6.7

## 2015-12-04 MED ORDER — TRAMADOL HCL 50 MG PO TABS: 50.0000 mg | ORAL_TABLET | Freq: Every day | ORAL | 0 refills | Status: DC | PRN

## 2015-12-04 NOTE — Assessment & Plan Note (Signed)
Patient received her flu shot today.

## 2015-12-04 NOTE — Progress Notes (Signed)
    CC: HTN  HPI: [name] is a 65 y.o. female with PMHx of HTN, T2DM, CKD III, and chronic HFpEF who presents to the clinic for follow up for for HTN and T2DM. Please see problem based assessment and plan for more information.  Patient is eating and drinking well. She denies chest pain or shortness of breath.   Past Medical History:  Diagnosis Date  . Arthritis    DJD, lumbar spondylosis  . Asthma   . Back pain   . Bilateral renal cysts   . Breast cancer ([place])    left breast invasive ductal ca in remission as of 10/07/11 s/p double mastectomy  . Bronchitis 03/10/2012  . Cataract   . Chemotherapy follow-up examination 03/06/2011  . Colon polyps   . Cough 03/06/2011  . Depression    seasonal melancholia  . E. coli UTI   . Edema extremities   . GERD (gastroesophageal reflux disease)   . Hyperlipidemia   . Hypertension   . Ileitis   . Kidney stones    non obstructive   . Myopathy    not statin related, cause unknown    Review of Systems: Please see pertinent ROS reviewed in HPI and problem based charting.   Physical Exam: Vitals:   12/04/15 1334  BP: 128/84  Pulse: 70  Temp: 97.8 F (36.6 C)  TempSrc: Oral  SpO2: 100%  Weight: 233 lb 9.6 oz (106 kg)   General: Vital signs reviewed.  Patient is obese, in no acute distress and cooperative with exam.  Eyes: PERRLA, conjunctivae normal, no scleral icterus.  Neck: Supple, trachea midline, no carotid bruit present.  Cardiovascular: RRR Pulmonary/Chest: Clear to auscultation bilaterally, no wheezes, rales, or rhonchi. Abdominal: Soft, non-tender, non-distended, BS +.  Extremities: Trace lower extremity edema bilaterally Skin: Warm, dry and intact.  Psychiatric: Normal mood and affect. speech and behavior is normal. Cognition and memory are normal.   Assessment & Plan:  See encounters tab for problem based medical decision making. Patient discussed with Dr. Dareen Piano

## 2015-12-04 NOTE — Assessment & Plan Note (Signed)
BP Readings from Last 3 Encounters:  12/04/15 128/84  09/04/15 (!) 154/84  07/30/15 (!) 146/77    Lab Results  Component Value Date   NA 144 05/29/2015   K 4.7 05/29/2015   CREATININE 1.55 (H) 05/29/2015    Assessment: Blood pressure control:  Controlled Progress toward BP goal:   At goal Comments: Patient reports compliance with hydralazine 25 mg QID, spironolactone 25 mg daily, olmesartan 20 mg daily. It is unclear if patient is taking Coreg 12.5 mg BID or 25 mg BID- this will need to be clarified at follow up visit. Either way, blood pressure is well controlled on her current regimen.   Plan: Medications:  continue current medications

## 2015-12-04 NOTE — Assessment & Plan Note (Signed)
Lab Results  Component Value Date   HGBA1C 6.7 12/04/2015   HGBA1C 6.8 09/04/2015   HGBA1C 7.0 05/29/2015     Assessment: Diabetes control:  Controlled Progress toward A1C goal:   At goal Comments: Compliant with Metformin 500 mg QD.  Plan: Medications:  continue current medications Instruction/counseling given: reminded to bring medications to each visit and discussed the need for weight loss Other plans: Follow up in 6 months

## 2015-12-04 NOTE — Assessment & Plan Note (Signed)
Patient reports chronic right knee and low back pain from chronic lumbar stenosis and arthritis. Patient takes tramadol 50 mg very infrequently. In fact, 90 pills will last her over one year. Tramadol allows her to exercise and perform her ADLs on days that her pain is exacerbated.   Plan: -Refilled Tramadol 50 mg QD prn #90

## 2015-12-04 NOTE — Patient Instructions (Signed)
Ms. Weimann,   Dennis Bast are doing a great job! Continue all medications as prescribed. Your blood pressure and diabetes are well controlled. I have also refilled your tramadol.  Please follow up with me in 6 months.  Sincerely, Dr. Quay Burow

## 2015-12-05 NOTE — Progress Notes (Signed)
Internal Medicine Clinic Attending  Case discussed with Dr. Burns soon after the resident saw the patient.  We reviewed the resident's history and exam and pertinent patient test results.  I agree with the assessment, diagnosis, and plan of care documented in the resident's note. 

## 2015-12-13 DIAGNOSIS — C50912 Malignant neoplasm of unspecified site of left female breast: Secondary | ICD-10-CM | POA: Diagnosis not present

## 2015-12-13 DIAGNOSIS — C50911 Malignant neoplasm of unspecified site of right female breast: Secondary | ICD-10-CM | POA: Diagnosis not present

## 2016-01-14 DIAGNOSIS — C50911 Malignant neoplasm of unspecified site of right female breast: Secondary | ICD-10-CM | POA: Diagnosis not present

## 2016-01-14 DIAGNOSIS — C50912 Malignant neoplasm of unspecified site of left female breast: Secondary | ICD-10-CM | POA: Diagnosis not present

## 2016-01-16 ENCOUNTER — Telehealth: Payer: Self-pay | Admitting: Pulmonary Disease

## 2016-01-16 DIAGNOSIS — G4733 Obstructive sleep apnea (adult) (pediatric): Secondary | ICD-10-CM

## 2016-01-16 NOTE — Telephone Encounter (Signed)
Called and spoke with pt and she is aware of order that has been placed. Nothing further is needed.

## 2016-01-16 NOTE — Telephone Encounter (Signed)
Send prescription for -Auto CPAP 5-10 cm with nasal pillows Office visit with TB in 6 weeks

## 2016-01-16 NOTE — Telephone Encounter (Signed)
Called and spoke with pt and she stated that she would like to start back on the cpap.  She stated that she never did own a cpap, but she had the sleep study in 2014 and cpap titration in 2015.  Last ov with RA was 11/2014.  RA please advise. thanks

## 2016-01-20 ENCOUNTER — Telehealth: Payer: Self-pay | Admitting: Pulmonary Disease

## 2016-01-20 ENCOUNTER — Telehealth: Payer: Self-pay | Admitting: *Deleted

## 2016-01-20 ENCOUNTER — Telehealth: Payer: Self-pay | Admitting: Internal Medicine

## 2016-01-20 DIAGNOSIS — G4733 Obstructive sleep apnea (adult) (pediatric): Secondary | ICD-10-CM

## 2016-01-20 NOTE — Telephone Encounter (Signed)
Pt calls and states in her mastectomy area and armpit that when she laughs a lot, gets excited. appt given

## 2016-01-20 NOTE — Telephone Encounter (Signed)
Melissa called from [facility] and stated that the pt will have to start over in order to be set up with CPAP.  Will need new OV with RA and sleep study.  RA please advise if you are ok with this. thanks

## 2016-01-20 NOTE — Telephone Encounter (Signed)
APT. REMINDER CALL, LMTCB °

## 2016-01-21 ENCOUNTER — Ambulatory Visit (INDEPENDENT_AMBULATORY_CARE_PROVIDER_SITE_OTHER): Payer: Commercial Managed Care - HMO | Admitting: Internal Medicine

## 2016-01-21 VITALS — BP 136/76 | HR 89 | Temp 98.1°F | Ht 68.0 in | Wt 235.0 lb

## 2016-01-21 DIAGNOSIS — Z85118 Personal history of other malignant neoplasm of bronchus and lung: Secondary | ICD-10-CM

## 2016-01-21 DIAGNOSIS — I10 Essential (primary) hypertension: Secondary | ICD-10-CM

## 2016-01-21 DIAGNOSIS — Z9221 Personal history of antineoplastic chemotherapy: Secondary | ICD-10-CM

## 2016-01-21 DIAGNOSIS — R0789 Other chest pain: Secondary | ICD-10-CM | POA: Insufficient documentation

## 2016-01-21 DIAGNOSIS — Z79899 Other long term (current) drug therapy: Secondary | ICD-10-CM

## 2016-01-21 DIAGNOSIS — Z9013 Acquired absence of bilateral breasts and nipples: Secondary | ICD-10-CM | POA: Diagnosis not present

## 2016-01-21 MED ORDER — DICLOFENAC SODIUM 1 % TD GEL: 4.0000 g | Freq: Four times a day (QID) | TRANSDERMAL | 2 refills | Status: DC

## 2016-01-21 MED ORDER — OLMESARTAN MEDOXOMIL 20 MG PO TABS: 20.0000 mg | ORAL_TABLET | Freq: Every day | ORAL | 3 refills | Status: DC

## 2016-01-21 NOTE — Progress Notes (Signed)
   CC: Left chest pain  HPI:  [name] is a 65 y.o. with a PMH of left sided stage 2 invasive ductal carcinoma s/p chemo and bil mastectomy, HTN, arthritis presenting to clinic for evaluation for 2 week history of left sided chest pain. She states it began about a day after she lifted 3-4 cases of water in the grocery store. The pain radiates to L shoulder, is increased with movement, is dull with rest but can be sharp in nature when exacerbated by movement. She denies swelling, lesion or localized trauma to the region. She denies shortness of breath, pain or sensory change in her left arm, nausea, vomiting, diaphoresis. She has not tried anything for the pain yet.   Please see problem based Assessment and Plan for status of patients chronic conditions.  Past Medical History:  Diagnosis Date  . Arthritis    DJD, lumbar spondylosis  . Asthma   . Back pain   . Bilateral renal cysts   . Breast cancer ([place])    left breast invasive ductal ca in remission as of 10/07/11 s/p double mastectomy  . Bronchitis 03/10/2012  . Cataract   . Chemotherapy follow-up examination 03/06/2011  . Colon polyps   . Cough 03/06/2011  . Depression    seasonal melancholia  . E. coli UTI   . Edema extremities   . GERD (gastroesophageal reflux disease)   . Hyperlipidemia   . Hypertension   . Ileitis   . Kidney stones    non obstructive   . Myopathy    not statin related, cause unknown     Review of Systems:   Review of Systems  Constitutional: Negative for chills, diaphoresis, fever and weight loss.  Respiratory: Negative for cough and shortness of breath.   Cardiovascular: Negative for chest pain, palpitations, orthopnea and leg swelling (not more than chronic leg swelling).  Gastrointestinal: Negative for abdominal pain, constipation, diarrhea, nausea and vomiting.  Skin: Negative for rash.  Neurological: Negative for tingling, sensory change, focal weakness, weakness and headaches.     Physical Exam:  Vitals:   01/21/16 1002 01/21/16 1110  BP: (!) 162/76 136/76  Pulse: 89   Temp: 98.1 F (36.7 C)   TempSrc: Oral   SpO2: 100%   Weight: 235 lb (106.6 kg)   Height: 5\' 8"  (1.727 m)    Physical Exam Constitutional: NAD, pleasant CV: RRR, no murmurs, rubs or gallops appreciated, 1+ bil LE edema, pulses intact Resp: CTAB, no increased work of breathing, no crackles or wheezing appreciated Abd: soft, +BS, NDNT MSK: s/p bilateral mastectomy; reproducible pain on palpation of left chest without associated swelling, nodules, rash, erythema or abrasion over area. Shoulder nontender over bony prominances. Strength and sensation intact in bil UE  Assessment & Plan:   See Encounters Tab for problem based charting.   Patient seen with Dr. Angelia Mould   Alphonzo Grieve, MD Internal Medicine PGY1

## 2016-01-21 NOTE — Patient Instructions (Signed)
For your musculoskeletal chest pain, please stretch the arm and area, apply heat, and apply the voltaren gel to the affected area as we discussed.   If your pain starts getting worse, if you begin having chest pain with exertion, have changes in your blood pressure or anything concerning to you, please call the clinic and we will be happy to see you.  I sent in the voltaren gel to [facility] and have refilled your blood pressure medicine through [facility].   It was nice meeting you!

## 2016-01-23 ENCOUNTER — Encounter: Payer: Self-pay | Admitting: Internal Medicine

## 2016-01-23 NOTE — Assessment & Plan Note (Signed)
Patient reports compliance with hydralazine 25mg  QID, olmesartan 20mg  daily, carvedilol 25mg  daily, spironolactone 25mg  daily. Blood pressure stable, at 135/76 in clinic.  Plan: --refilled omlesartan 20mg  daily --continue current regimen

## 2016-01-23 NOTE — Assessment & Plan Note (Signed)
Patient with multiple risk factors for ACS presenting with chest pain consistent with musculoskeletal etiology following lifting of heavy cases of water. Pain is reproducible on exam. Though high risk, no concern at the moment for ACS.   Plan: --instructed patient in use of heat to area --prescribed voltaren gel and instructed in proper use --discussed return precautions of worsening chest pain, shortness of breath, nausea, dizziness

## 2016-01-24 ENCOUNTER — Other Ambulatory Visit: Payer: Self-pay | Admitting: *Deleted

## 2016-01-24 NOTE — Telephone Encounter (Signed)
Please send to different DME with prescription and patient's sleep report

## 2016-01-24 NOTE — Telephone Encounter (Signed)
Called and let pt know the refill had been sent, sent carvedilol to dr burns but dr bensihmon usually does, will call pt mon if she needs to call his office

## 2016-01-24 NOTE — Telephone Encounter (Signed)
-----   Message from [place] sent at 01/24/2016  2:25 PM EDT ----- Regarding: [employer] [facility] [employer] Contact: [phone] [place], [place] , REQUESTING HER BENACAR. I THINK IT IS DELIVERED, IF IT ALREADY DONE, NO WORRY. THANKS LELA

## 2016-01-27 MED ORDER — CARVEDILOL 12.5 MG PO TABS: 12.5000 mg | ORAL_TABLET | Freq: Two times a day (BID) | ORAL | 3 refills | Status: DC

## 2016-01-27 NOTE — Telephone Encounter (Signed)
Calling a/b a cpap order, says that she hasn't heard from supplier..please advise pt can be reached @ [phone].[name]

## 2016-01-27 NOTE — Progress Notes (Signed)
Internal Medicine Clinic Attending  I saw and evaluated the patient.  I personally confirmed the key portions of the history and exam documented by Dr. Svalina and I reviewed pertinent patient test results.  The assessment, diagnosis, and plan were formulated together and I agree with the documentation in the resident's note.  

## 2016-01-27 NOTE — Telephone Encounter (Signed)
I have placed an order for DME--will need to send to another DME company per RA.  This order has been placed and I called and lmomtcb x 1 for the pt.

## 2016-01-31 NOTE — Telephone Encounter (Signed)
lmtcb X2 for pt.  

## 2016-02-03 NOTE — Telephone Encounter (Signed)
I have called [facility] and they are needing an rx that has all the information and the MD signature and NPI number on it. She is faxing over a form to be filled out.  Will hold this in my box until received.

## 2016-02-07 NOTE — Telephone Encounter (Signed)
I do not have anything on this patient and have not been given anything from Dr Elsworth Soho either.

## 2016-02-07 NOTE — Telephone Encounter (Signed)
ashtyn have you seen this form from [facility]?

## 2016-02-10 NOTE — Telephone Encounter (Signed)
I have received this form and placed it in RA look at to be completed.

## 2016-02-10 NOTE — Telephone Encounter (Signed)
Will send to Dr Elsworth Soho as Juluis Rainier.

## 2016-02-17 ENCOUNTER — Other Ambulatory Visit: Payer: Self-pay | Admitting: *Deleted

## 2016-02-17 ENCOUNTER — Telehealth: Payer: Self-pay | Admitting: *Deleted

## 2016-02-17 DIAGNOSIS — Z794 Long term (current) use of insulin: Principal | ICD-10-CM

## 2016-02-17 DIAGNOSIS — Z853 Personal history of malignant neoplasm of breast: Secondary | ICD-10-CM

## 2016-02-17 DIAGNOSIS — E119 Type 2 diabetes mellitus without complications: Secondary | ICD-10-CM

## 2016-02-17 MED ORDER — TRIAMCINOLONE ACETONIDE 0.1 % EX CREA: TOPICAL_CREAM | Freq: Two times a day (BID) | CUTANEOUS | 0 refills | Status: DC | PRN

## 2016-02-17 NOTE — Telephone Encounter (Signed)
Pt here at the clinic with her brother who has an appt. Requesting referrals back to see Dr [name] - surgical and opthalmology referral w/Dr Rex Kras in [facility].

## 2016-02-17 NOTE — Telephone Encounter (Signed)
States slight rash on leg has came back.

## 2016-02-19 NOTE — Telephone Encounter (Signed)
[phone] pt still hasnt got her cpap

## 2016-02-19 NOTE — Telephone Encounter (Signed)
I located the form in Dr. Bari Mantis lookat folder (green folder in his office)  I called and spoke with the pt and advised looks like we are just needing this signed  I will forward to RA to make him aware to sign asap

## 2016-02-20 NOTE — Telephone Encounter (Signed)
Was instructed by a team lead that all paperwork that was completed by RA was given to [name]. [name] do you know if this form was returned to you?

## 2016-02-20 NOTE — Telephone Encounter (Signed)
done

## 2016-02-20 NOTE — Telephone Encounter (Signed)
LM for pt x 1   Order form for CPAP has been signed and given to [facility] and Judeen Hammans are doing research to see what all is needed to be faxed with order form as Rodena Piety is out today and she is normally that one to do these tests.   Will fwd to [facility] to advise.

## 2016-02-21 ENCOUNTER — Telehealth: Payer: Self-pay | Admitting: Pulmonary Disease

## 2016-02-21 NOTE — Telephone Encounter (Signed)
Patient returning call -pr °

## 2016-02-21 NOTE — Telephone Encounter (Signed)
Called and spoke with pt and she stated that she has not heard anything back about the cpap.  From [place].  I called [facility] and spoke with the [facility] Dept for [facility] at [phone] and her acc# is [id number]  They stated that the pts account has been placed on hold until a signed order for the cpap has been received.  Will forward to [facility] to follow up on this. thanks

## 2016-02-24 NOTE — Telephone Encounter (Signed)
pcc-[name] has resent the info to [place]

## 2016-02-25 NOTE — Telephone Encounter (Signed)
PCC's please advise if this has been completed so we may contact the pt to let her know.  thanks

## 2016-02-25 NOTE — Telephone Encounter (Signed)
Rodena Piety faxed on 02/21/16 the paperwork needed for this to [facility] they should have all the need now Joellen Jersey

## 2016-03-10 ENCOUNTER — Other Ambulatory Visit: Payer: Self-pay | Admitting: Internal Medicine

## 2016-03-10 DIAGNOSIS — E119 Type 2 diabetes mellitus without complications: Secondary | ICD-10-CM

## 2016-03-10 DIAGNOSIS — M1711 Unilateral primary osteoarthritis, right knee: Secondary | ICD-10-CM

## 2016-03-11 NOTE — Telephone Encounter (Signed)
Rx phoned into the pharmacy.Marland KitchenMarland KitchenDespina Hidden Cassady12/20/20171:52 PM

## 2016-03-13 DIAGNOSIS — G4733 Obstructive sleep apnea (adult) (pediatric): Secondary | ICD-10-CM | POA: Diagnosis not present

## 2016-04-03 ENCOUNTER — Telehealth: Payer: Self-pay

## 2016-04-03 NOTE — Telephone Encounter (Signed)
Pt needs to speak with a nurse regarding burning sensation of the private are. Please call pt back.

## 2016-04-03 NOTE — Telephone Encounter (Signed)
Denies fever, disch, abd pain, bleeding, unusual odor. She will monitor and if cont call next week for an appt or go to ED for any unusual symptoms

## 2016-04-13 DIAGNOSIS — H2513 Age-related nuclear cataract, bilateral: Secondary | ICD-10-CM | POA: Diagnosis not present

## 2016-04-13 DIAGNOSIS — H5203 Hypermetropia, bilateral: Secondary | ICD-10-CM | POA: Insufficient documentation

## 2016-04-13 DIAGNOSIS — H25013 Cortical age-related cataract, bilateral: Secondary | ICD-10-CM

## 2016-04-13 DIAGNOSIS — H43811 Vitreous degeneration, right eye: Secondary | ICD-10-CM | POA: Diagnosis not present

## 2016-04-13 DIAGNOSIS — E119 Type 2 diabetes mellitus without complications: Secondary | ICD-10-CM | POA: Insufficient documentation

## 2016-04-13 DIAGNOSIS — H524 Presbyopia: Secondary | ICD-10-CM | POA: Diagnosis not present

## 2016-04-13 DIAGNOSIS — G4733 Obstructive sleep apnea (adult) (pediatric): Secondary | ICD-10-CM | POA: Diagnosis not present

## 2016-04-13 HISTORY — DX: Vitreous degeneration, right eye: H43.811

## 2016-04-13 HISTORY — DX: Cortical age-related cataract, bilateral: H25.013

## 2016-04-17 DIAGNOSIS — R0789 Other chest pain: Secondary | ICD-10-CM | POA: Diagnosis not present

## 2016-04-17 DIAGNOSIS — C50912 Malignant neoplasm of unspecified site of left female breast: Secondary | ICD-10-CM | POA: Diagnosis not present

## 2016-04-20 ENCOUNTER — Ambulatory Visit
Admission: RE | Admit: 2016-04-20 | Discharge: 2016-04-20 | Disposition: A | Discharge status: Home / Self Care | Payer: Medicare HMO | Source: Ambulatory Visit | Attending: General Surgery | Admitting: General Surgery

## 2016-04-20 ENCOUNTER — Other Ambulatory Visit: Payer: Self-pay | Admitting: General Surgery

## 2016-04-20 DIAGNOSIS — R0789 Other chest pain: Secondary | ICD-10-CM

## 2016-04-20 DIAGNOSIS — R079 Chest pain, unspecified: Secondary | ICD-10-CM | POA: Diagnosis not present

## 2016-05-14 DIAGNOSIS — G4733 Obstructive sleep apnea (adult) (pediatric): Secondary | ICD-10-CM | POA: Diagnosis not present

## 2016-06-03 ENCOUNTER — Encounter: Payer: Commercial Managed Care - HMO | Admitting: Internal Medicine

## 2016-06-03 ENCOUNTER — Telehealth: Payer: Self-pay | Admitting: Pulmonary Disease

## 2016-06-03 NOTE — Telephone Encounter (Signed)
lmtcb for pt.  

## 2016-06-04 NOTE — Telephone Encounter (Signed)
Please confirm that she is on auto CPAP 5-10 cm, if so that is the lowest pressure Please arrange office visit to discuss if that is the case

## 2016-06-04 NOTE — Telephone Encounter (Signed)
Pt returning call and can be reached @ [phone].[name]

## 2016-06-04 NOTE — Telephone Encounter (Signed)
Dr. Elsworth Soho  Pt called into the office concerned that she is unable to sleep with her cpap machine. She feels like the pressure is too much for her and it is keeping her up. Wanted to know if the pressure could be adjusted.

## 2016-06-05 NOTE — Telephone Encounter (Signed)
Called and spoke with pt and she is aware of appt on 3/19 with RA at 11:30

## 2016-06-08 ENCOUNTER — Encounter: Payer: Self-pay | Admitting: Pulmonary Disease

## 2016-06-08 ENCOUNTER — Ambulatory Visit (INDEPENDENT_AMBULATORY_CARE_PROVIDER_SITE_OTHER): Payer: Medicare HMO | Admitting: Pulmonary Disease

## 2016-06-08 VITALS — BP 124/80 | HR 77 | Ht 68.0 in | Wt 235.6 lb

## 2016-06-08 DIAGNOSIS — G4733 Obstructive sleep apnea (adult) (pediatric): Secondary | ICD-10-CM

## 2016-06-08 DIAGNOSIS — J45991 Cough variant asthma: Secondary | ICD-10-CM | POA: Diagnosis not present

## 2016-06-08 MED ORDER — ALBUTEROL SULFATE HFA 108 (90 BASE) MCG/ACT IN AERS: INHALATION_SPRAY | RESPIRATORY_TRACT | 4 refills | Status: DC

## 2016-06-08 NOTE — Assessment & Plan Note (Signed)
Refills on albuterol will be sent Okay to stop using Advair  She will use Claritin for seasonal allergies

## 2016-06-08 NOTE — Assessment & Plan Note (Signed)
Discontinue CPAP - unable to use it and poor compliance Referral to dentist Dr. Ron Parker for oral appliance

## 2016-06-08 NOTE — Progress Notes (Signed)
   Subjective:    Patient ID: [name], female    DOB: 01-20-1951, 66 y.o.   MRN: [id number]  HPI  66/F, never smoker, for FU of OSA & recurrent cough & wheezing  Attributed to cough variant asthma , sinuses & GERD  She reported intermittent wheezing especially in spring & fall for 3 yrs , wheezing was noted in the clinic in 01/2011 and she was given a diagnosis of asthma. She takes advair only seasonally. Off daily advair since 1/ 2014.  She is s/p chemotherapy for Breast cancer (Dr Humphrey Rolls) . Grade 1 diastolic dysfunction noted by Dr Haroldine Laws on echo. She reports pedal edema & takes lasix daily.     06/08/2016  Chief Complaint  Patient presents with  . Follow-up    CPAP blows out too much air per pt.    She was started on CPAP in October 2017, really has not been able to use it at all. She feels that the pressure is too high. We had several nocturnal CPAP 5-10 cm. Unable to tolerate nasal pillows. This sounded change to nasal mask because she has hot flashes and night sweats. She really is ready to stop using her machine completely  She has been unable to stop albuterol and not needed this for the last 2 years, she uses albuterol on an as-needed basis and seasonally also uses Claritin   Significant tests/ events  Methacholine challenge 11/2007 drop in smaller airways >> added singulair  spirometry 11/2007 wnl & 11/2010 -no obstruction.  Head CT 9/12  Ethmoid sinus mucosal thickening and bubbly opacity in the left sphenoid  PSG 06/2012 showed mild OSA -  she felt very claustrophobic,she could not afford the CPAP - hence abandoned   Review of Systems Patient denies significant dyspnea,cough, hemoptysis,  chest pain, palpitations, pedal edema, orthopnea, paroxysmal nocturnal dyspnea, lightheadedness, nausea, vomiting, abdominal or  leg pains      Objective:   Physical Exam  Gen. Pleasant, obese, in no distress ENT - no lesions, no post nasal drip Neck: No JVD, no  thyromegaly, no carotid bruits Lungs: no use of accessory muscles, no dullness to percussion, decreased without rales or rhonchi  Cardiovascular: Rhythm regular, heart sounds  normal, no murmurs or gallops, no peripheral edema Musculoskeletal: No deformities, no cyanosis or clubbing , no tremors       Assessment & Plan:

## 2016-06-08 NOTE — Addendum Note (Signed)
Addended by: Valerie Salts on: 06/08/2016 03:08 PM   Modules accepted: Orders

## 2016-06-08 NOTE — Patient Instructions (Signed)
Discontinue CPAP Referral to dentist Dr. Ron Parker for oral appliance  Refills on albuterol will be sent Okay to stop using Advair

## 2016-06-10 ENCOUNTER — Ambulatory Visit (INDEPENDENT_AMBULATORY_CARE_PROVIDER_SITE_OTHER): Payer: Medicare HMO | Admitting: Internal Medicine

## 2016-06-10 ENCOUNTER — Encounter: Payer: Self-pay | Admitting: Internal Medicine

## 2016-06-10 ENCOUNTER — Other Ambulatory Visit: Payer: Self-pay | Admitting: Internal Medicine

## 2016-06-10 VITALS — BP 162/86 | HR 68 | Temp 98.0°F | Ht 68.0 in | Wt 233.6 lb

## 2016-06-10 DIAGNOSIS — E119 Type 2 diabetes mellitus without complications: Secondary | ICD-10-CM

## 2016-06-10 DIAGNOSIS — Z79899 Other long term (current) drug therapy: Secondary | ICD-10-CM

## 2016-06-10 DIAGNOSIS — Z0000 Encounter for general adult medical examination without abnormal findings: Secondary | ICD-10-CM

## 2016-06-10 DIAGNOSIS — I10 Essential (primary) hypertension: Secondary | ICD-10-CM | POA: Diagnosis not present

## 2016-06-10 DIAGNOSIS — Z9013 Acquired absence of bilateral breasts and nipples: Secondary | ICD-10-CM | POA: Diagnosis not present

## 2016-06-10 DIAGNOSIS — G4733 Obstructive sleep apnea (adult) (pediatric): Secondary | ICD-10-CM

## 2016-06-10 DIAGNOSIS — Z853 Personal history of malignant neoplasm of breast: Secondary | ICD-10-CM | POA: Diagnosis not present

## 2016-06-10 DIAGNOSIS — Z7984 Long term (current) use of oral hypoglycemic drugs: Secondary | ICD-10-CM

## 2016-06-10 LAB — POCT GLYCOSYLATED HEMOGLOBIN (HGB A1C): Hemoglobin A1C: 6.9

## 2016-06-10 LAB — GLUCOSE, CAPILLARY: Glucose-Capillary: 105 mg/dL — ABNORMAL HIGH (ref 65–99)

## 2016-06-10 NOTE — Patient Instructions (Signed)
You are doing a great job! Please continue taking all medications as prescribed. Please remember that you need a repeat colonoscopy in August, 2018. Please follow up with [place] in 6 months.

## 2016-06-10 NOTE — Progress Notes (Signed)
    CC: Follow-up for hypertension  HPI: [name] is a 66 y.o. female with PMHx of hypertension, OSA, type 2 diabetes, history of left breast cancer who presents to the clinic for follow-up for hypertension and type 2 diabetes.   Patient is eating and drinking well. She denies chest pain, shortness of breath. Please see problem based assessment and plan for more information of chronic medical conditions.  Patient has history of obstructive sleep apnea, follows with Dr. Elsworth Soho with pulmonary. Patient has been unable to tolerate her CPAP machine and has not been compliant. CPAP was discontinued and she was referred to dentistry Dr. Ron Parker, for oral appliance application.   Past Medical History:  Diagnosis Date  . Arthritis    DJD, lumbar spondylosis  . Asthma   . Back pain   . Bilateral renal cysts   . Breast cancer ([place])    left breast invasive ductal ca in remission as of 10/07/11 s/p double mastectomy  . Bronchitis 03/10/2012  . Cataract   . Chemotherapy follow-up examination 03/06/2011  . Colon polyps   . Cough 03/06/2011  . Depression    seasonal melancholia  . E. coli UTI   . Edema extremities   . GERD (gastroesophageal reflux disease)   . Hyperlipidemia   . Hypertension   . Ileitis   . Kidney stones    non obstructive   . Myopathy    not statin related, cause unknown     Review of Systems: Please see pertinent ROS reviewed in HPI and problem based charting.   Physical Exam: Vitals:   06/10/16 1320  BP: (!) 159/85  Pulse: 78  Temp: 98 F (36.7 C)  TempSrc: Oral  SpO2: 99%  Weight: 233 lb 9.6 oz (106 kg)  Height: 5\' 8"  (1.727 m)   General: Vital signs reviewed.  Patient is well-developed and well-nourished, in no acute distress and cooperative with exam.  Neck: Supple, trachea midline, no carotid bruit present.  Cardiovascular: RRR, S1 normal, S2 normal, no murmurs, gallops, or rubs. Pulmonary/Chest: Clear to auscultation bilaterally, no wheezes,  rales, or rhonchi. Abdominal: Soft, non-tender, non-distended, BS + Extremities: Trace lower extremity edema bilaterally,  pulses symmetric and intact bilaterally. No cyanosis or clubbing. Skin: Warm, dry and intact. No rashes or erythema. Psychiatric: Normal mood and affect. speech and behavior is normal. Cognition and memory are normal.   Assessment & Plan:  See encounters tab for problem based medical decision making. Patient discussed with Dr. Lynnae January

## 2016-06-10 NOTE — Assessment & Plan Note (Signed)
Patient has history of obstructive sleep apnea, follows with Dr. Elsworth Soho with pulmonary. Patient has been unable to tolerate her CPAP machine and has not been compliant. CPAP was discontinued and she was referred to dentistry Dr. Ron Parker, for oral appliance application.

## 2016-06-11 NOTE — Assessment & Plan Note (Signed)
Lab Results  Component Value Date   HGBA1C 6.9 06/10/2016   HGBA1C 6.7 12/04/2015   HGBA1C 6.8 09/04/2015     Assessment: Diabetes control:  control Progress toward A1C goal:   at goal Comments: Patient reports compliance with metformin 500 mg once a day.  Plan: Medications:  continue current medications Instruction/counseling given: discussed the need for weight loss

## 2016-06-11 NOTE — Assessment & Plan Note (Signed)
Breast cancer screening: Patient no longer receives mammograms since she underwent bilateral mastectomies for history of breast cancer. Colon cancer screening: Repeat due in August 2018 with Dr. Benson Norway Ophthalmology exam: Completed on 05/13/16

## 2016-06-11 NOTE — Assessment & Plan Note (Signed)
BP Readings from Last 3 Encounters:  06/10/16 (!) 162/86  06/08/16 124/80  01/21/16 136/76    Lab Results  Component Value Date   NA 144 05/29/2015   K 4.7 05/29/2015   CREATININE 1.55 (H) 05/29/2015    Assessment: Blood pressure control:  uncontrolled Progress toward BP goal:   deteriorated Comments: Patient reports compliance with Coreg 12.5 twice a day, hydralazine 25 mg 4 times a day, olmesartan 20 mg daily, spironolactone 25 mg daily. Blood pressure was just normotensive 3 days ago at her pulmonologist office at 124/80. Patient is normally normotensive in clinic.  Plan: Medications:  continue current medications given that patient is normally well controlled on this regimen Other plans: Follow up in 4 weeks for blood pressure recheck in acute care clinic. If persistently elevated, would consider increasing spironolactone or carvedilol. Patient is not on amlodipine or HCTZ due to intolerance.

## 2016-06-12 NOTE — Progress Notes (Signed)
Internal Medicine Clinic Attending  Case discussed with Dr. Burns soon after the resident saw the patient.  We reviewed the resident's history and exam and pertinent patient test results.  I agree with the assessment, diagnosis, and plan of care documented in the resident's note. 

## 2016-06-15 DIAGNOSIS — C50912 Malignant neoplasm of unspecified site of left female breast: Secondary | ICD-10-CM | POA: Diagnosis not present

## 2016-06-15 DIAGNOSIS — C50911 Malignant neoplasm of unspecified site of right female breast: Secondary | ICD-10-CM | POA: Diagnosis not present

## 2016-06-22 ENCOUNTER — Telehealth: Payer: Self-pay | Admitting: Pulmonary Disease

## 2016-06-22 NOTE — Telephone Encounter (Signed)
Received fax from Dr. Ron Parker office that they have received the paperwork for the oral appliance but the patient has delayed the process until a later date.   Nothing else needed.

## 2016-06-23 ENCOUNTER — Telehealth: Payer: Self-pay | Admitting: Pulmonary Disease

## 2016-06-23 NOTE — Telephone Encounter (Signed)
Spoke with pt, states she saw Dr. Ron Parker and her dental appliance for osa is not covered by insurance, wants to restart cpap.  Pt wishes to purchase a machine.  RA please advise on cpap recs.  Thanks!

## 2016-06-24 NOTE — Telephone Encounter (Signed)
She only has mild OSA - which does not have cardiac vascular consequences She has already tried CPAP before and not been able to use it She is welcome to by CPAP but I'm not sure that she will do much better in terms of usage

## 2016-06-26 NOTE — Telephone Encounter (Signed)
Spoke with pt, aware of recs.  Pt wishes to hold off on cpap at this time, and can reassess if symptoms change.  Nothing further needed.

## 2016-06-30 ENCOUNTER — Other Ambulatory Visit: Payer: Self-pay | Admitting: Internal Medicine

## 2016-06-30 DIAGNOSIS — E785 Hyperlipidemia, unspecified: Secondary | ICD-10-CM

## 2016-07-01 DIAGNOSIS — H52209 Unspecified astigmatism, unspecified eye: Secondary | ICD-10-CM | POA: Diagnosis not present

## 2016-07-01 DIAGNOSIS — H524 Presbyopia: Secondary | ICD-10-CM | POA: Diagnosis not present

## 2016-07-01 DIAGNOSIS — H5203 Hypermetropia, bilateral: Secondary | ICD-10-CM | POA: Diagnosis not present

## 2016-07-07 ENCOUNTER — Telehealth: Payer: Self-pay | Admitting: Internal Medicine

## 2016-07-07 NOTE — Telephone Encounter (Signed)
Calling to confirm appointment for 07/08/16 at 10:45 no answer vm full

## 2016-07-08 ENCOUNTER — Ambulatory Visit (INDEPENDENT_AMBULATORY_CARE_PROVIDER_SITE_OTHER): Payer: Medicare HMO | Admitting: Internal Medicine

## 2016-07-08 ENCOUNTER — Encounter (INDEPENDENT_AMBULATORY_CARE_PROVIDER_SITE_OTHER): Payer: Self-pay

## 2016-07-08 DIAGNOSIS — Z8719 Personal history of other diseases of the digestive system: Secondary | ICD-10-CM | POA: Diagnosis not present

## 2016-07-08 DIAGNOSIS — I10 Essential (primary) hypertension: Secondary | ICD-10-CM

## 2016-07-08 DIAGNOSIS — Z79899 Other long term (current) drug therapy: Secondary | ICD-10-CM

## 2016-07-08 DIAGNOSIS — Z8639 Personal history of other endocrine, nutritional and metabolic disease: Secondary | ICD-10-CM | POA: Diagnosis not present

## 2016-07-08 DIAGNOSIS — Z87448 Personal history of other diseases of urinary system: Secondary | ICD-10-CM | POA: Diagnosis not present

## 2016-07-08 DIAGNOSIS — Z8679 Personal history of other diseases of the circulatory system: Secondary | ICD-10-CM | POA: Diagnosis not present

## 2016-07-08 DIAGNOSIS — K120 Recurrent oral aphthae: Secondary | ICD-10-CM

## 2016-07-08 NOTE — Patient Instructions (Signed)
General Instructions: - Log blood pressures for the next 1 month - Please bring your home BP meter to your next visit - Follow up in 1 month - Avoid biting cheeks. Brush your teeth gently. If you develop another aphthous ulcer can be seen in clinic for further evaluation  Thank you for bringing your medicines today. This helps [place] keep you safe from mistakes.   Progress Toward Treatment Goals:  Treatment Goal 08/23/2014  Hemoglobin A1C at goal  Blood pressure at goal    Self Care Goals & Plans:  Self Care Goal 09/04/2015  Manage my medications take my medicines as prescribed; bring my medications to every visit; refill my medications on time  Monitor my health keep track of my blood pressure; check my feet daily; bring my glucose meter and log to each visit; keep track of my blood glucose  Eat healthy foods eat foods that are low in salt; eat more vegetables; eat baked foods instead of fried foods  Be physically active find an activity I enjoy  Meeting treatment goals -    Home Blood Glucose Monitoring 08/23/2014  Check my blood sugar once a day  When to check my blood sugar before breakfast     Care Management & Community Referrals:  Referral 08/23/2014  Referrals made for care management support none needed  Referrals made to community resources none

## 2016-07-08 NOTE — Progress Notes (Signed)
   CC: Hypertension follow up  HPI:  [name] is a 66 y.o. woman with PMHx as noted below who presents today for follow up of her hypertension.  BP today is 141/78. She is taking Coreg 12.5 mg BID, Hydralazine 25 mg 4 times daily, Olmesartan 20 mg daily, and Spironolactone 25 mg daily. She brought her home BP cuff today and average BP over last 3 days is 138/77. Looking over several readings, most systolic BPs in the 080-223 range. She reports she eats at home for almost every meal as her daughter cooks for her and her daughter does not use extra salt with cooking.   She is also concerned about getting white lesions in her mouth occasionally. These typically occur on the inside of her cheeks. They are non-painful. She denies any lesions on her tongue. She reports biting on her cheek and brushing roughly to get rid of the lesions. She denies any bleeding.   Past Medical History:  Diagnosis Date  . Arthritis    DJD, lumbar spondylosis  . Asthma   . Back pain   . Bilateral renal cysts   . Breast cancer ([place])    left breast invasive ductal ca in remission as of 10/07/11 s/p double mastectomy  . Bronchitis 03/10/2012  . Cataract   . Chemotherapy follow-up examination 03/06/2011  . Colon polyps   . Cough 03/06/2011  . Depression    seasonal melancholia  . E. coli UTI   . Edema extremities   . GERD (gastroesophageal reflux disease)   . Hyperlipidemia   . Hypertension   . Ileitis   . Kidney stones    non obstructive   . Myopathy    not statin related, cause unknown     Review of Systems:   General: Denies fever, chills, night sweats, changes in weight, changes in appetite HEENT: Denies headaches, ear pain, changes in vision, rhinorrhea, sore throat CV: Denies CP, palpitations, SOB, orthopnea Pulm: Denies SOB, cough, wheezing GI: Denies abdominal pain, nausea, vomiting, diarrhea, constipation, melena, hematochezia GU: Denies dysuria, hematuria, frequency Msk: Denies  muscle cramps, joint pains Neuro: Denies weakness, numbness, tingling Skin: Denies rashes, bruising Psych: Denies depression, anxiety, hallucinations  Physical Exam:  Vitals:   07/08/16 1107  BP: (!) 141/78  Pulse: 81  Temp: 98.6 F (37 C)  TempSrc: Oral  SpO2: 100%  Weight: 233 lb 1.6 oz (105.7 kg)  Height: 5\' 8"  (1.727 m)   General: Well-nourished woman in NAD HEENT: EOMI, no lesions appreciated in the mouth, mucus membranes moist CV: RRR, no m/g/r  Assessment & Plan:   See Encounters Tab for problem based charting.  Patient discussed with Dr. Lynnae January

## 2016-07-09 ENCOUNTER — Encounter: Payer: Self-pay | Admitting: Internal Medicine

## 2016-07-09 DIAGNOSIS — K120 Recurrent oral aphthae: Secondary | ICD-10-CM | POA: Insufficient documentation

## 2016-07-09 NOTE — Progress Notes (Signed)
Internal Medicine Clinic Attending  Case discussed with Dr. Rivet soon after the resident saw the patient.  We reviewed the resident's history and exam and pertinent patient test results.  I agree with the assessment, diagnosis, and plan of care documented in the resident's note.  

## 2016-07-09 NOTE — Assessment & Plan Note (Addendum)
The lesions in her mouth that she is describing sound most consistent with aphthous ulcers. There were no lesions observed on exam today. She was advised to return to clinic when another one develops so that we can further evaluate. She is a never smoker. Recommended to avoid biting on her cheek and to brush her teeth and mouth gently. Could consider topical treatment in the future if these continue to be bothersome.

## 2016-07-09 NOTE — Assessment & Plan Note (Signed)
Her home blood pressures are mostly well controlled in the 368-599U systolic. There were a few readings in the 341-443Q systolic. Hesitant to make any blood pressure medicine changes at this time given these findings. Recommended that she check her blood pressure daily with her own machine and return in 1 month for a BP recheck. She was also given a BP log. Advised her to bring her home BP machine to her next visit. Will continue her on her current regimen. If home BP readings are mildly elevated would consider increasing her Olmesartan or Spironolactone with her hx of diabetes, CKD, and CHF.

## 2016-07-10 DIAGNOSIS — C50911 Malignant neoplasm of unspecified site of right female breast: Secondary | ICD-10-CM | POA: Diagnosis not present

## 2016-07-10 DIAGNOSIS — C50912 Malignant neoplasm of unspecified site of left female breast: Secondary | ICD-10-CM | POA: Diagnosis not present

## 2016-07-28 ENCOUNTER — Ambulatory Visit: Payer: Commercial Managed Care - HMO | Admitting: Hematology and Oncology

## 2016-08-01 NOTE — Assessment & Plan Note (Signed)
Left breast invasive ductal carcinoma stage II a ER/PR positive HER-2 positive status post bilateral mastectomies (Right CVA, Rt IJ DVT) Followed by 6 cycles of Standard followed by Herceptin maintenance completed 04/17/2011   Current treatment: letrozole 2.5 mg daily started June 2012 completed June 2017 We previously discussed the pros and cons of extended adjuvant therapy. Breast cancer index revealed that the patient has high risk of recurrence at 10% between your 5-10. However she has no probability of benefit from extended adjuvant therapy. Based on this result she discontinued antiestrogen therapy June 2017.  Breast Cancer Surveillance: 1. Breast exam 08/03/2016: Normal 2. No role of imaging studies since she had bilateral mastectomies 3. Bone density June 2015: normal T score +2.2   Return to clinic in 1 year for follow-up

## 2016-08-03 ENCOUNTER — Encounter: Payer: Self-pay | Admitting: Hematology and Oncology

## 2016-08-03 ENCOUNTER — Ambulatory Visit (HOSPITAL_BASED_OUTPATIENT_CLINIC_OR_DEPARTMENT_OTHER): Payer: Medicare HMO | Admitting: Hematology and Oncology

## 2016-08-03 DIAGNOSIS — Z170 Estrogen receptor positive status [ER+]: Secondary | ICD-10-CM | POA: Diagnosis not present

## 2016-08-03 DIAGNOSIS — C50412 Malignant neoplasm of upper-outer quadrant of left female breast: Secondary | ICD-10-CM

## 2016-08-03 NOTE — Progress Notes (Signed)
 Patient Care Team: Burns, Alexa R, MD as PCP - General (Internal Medicine) Bensimhon, Daniel R, MD as Referring Physician (Cardiology) Shapiro, Mark, MD as Consulting Physician (Ophthalmology)  DIAGNOSIS:  Encounter Diagnosis  Name Primary?  . Malignant neoplasm of upper-outer quadrant of left breast in female, estrogen receptor positive (HCC)     SUMMARY OF ONCOLOGIC HISTORY:   Breast cancer of upper-outer quadrant of left female breast (HCC)   01/15/2010 Surgery     bilateral mastectomies: left breast IDC , ER/PR positive HER-2 positive  stage II a;  right breast benign      04/17/2010 - 04/17/2011 Chemotherapy    weekly Taxotere carboplatinum and Herceptin x 6 cycles  followed by Herceptin maintenance      09/01/2010 - 09/02/2015 Anti-estrogen oral therapy     letrozole 2.5 mg daily (BCI revealed 10%, high risk of recurrence but low probability of benefit from extended adjuvant therapy)       CHIEF COMPLIANT: Surveillance of breast cancer  INTERVAL HISTORY: [name] is a 65-year-old with above-mentioned history of bilateral mastectomies for left-sided breast cancer who received adjuvant chemotherapy and has been on antiestrogen therapy with letrozole up until June 2017. Breast cancer index did not show any benefit from extended adjuvant therapy and so it was discontinued at that time. She has been doing quite well. She continues to have profound hot flashes from menopausal symptoms. She was previously offered a medication for the hot flashes but she refused. She denies any new lumps or nodules. When she lifted heavy weights at one point she had a lot of muscle spasms in the chest wall but they have resolved.  REVIEW OF SYSTEMS:   Constitutional: Denies fevers, chills or abnormal weight loss Eyes: Denies blurriness of vision Ears, nose, mouth, throat, and face: Denies mucositis or sore throat Respiratory: Denies cough, dyspnea or wheezes Cardiovascular: Denies  palpitation, chest discomfort Gastrointestinal:  Denies nausea, heartburn or change in bowel habits Skin: Denies abnormal skin rashes Lymphatics: Denies new lymphadenopathy or easy bruising Neurological:Denies numbness, tingling or new weaknesses Behavioral/Psych: Mood is stable, no new changes  Extremities: No lower extremity edema Breast:  denies any pain or lumps or nodules in the chest wall or axilla All other systems were reviewed with the patient and are negative.  I have reviewed the past medical history, past surgical history, social history and family history with the patient and they are unchanged from previous note.  ALLERGIES:  is allergic to atorvastatin; hctz [hydrochlorothiazide]; norvasc [amlodipine besylate]; and rosuvastatin.  MEDICATIONS:  Current Outpatient Prescriptions  Medication Sig Dispense Refill  . albuterol (VENTOLIN HFA) 108 (90 Base) MCG/ACT inhaler INHALE 1 TO 2 PUFFS INTO THE LUNGS EVERY 6 (SIX) HOURS AS NEEDED FOR WHEEZING OR SHORTNESS OF BREATH. 18 g 4  . Alcohol Swabs (B-D SINGLE USE SWABS REGULAR) PADS 1 application by Does not apply route 1 day or 1 dose. Use 1 time daily to check blood sugar. diag code E11.9. Non insulin dependent 90 each 6  . aspirin 81 MG tablet Take 81 mg by mouth daily.    . Blood Glucose Monitoring Suppl (PRODIGY VOICE BLOOD GLUCOSE) W/DEVICE KIT 1 Device by Does not apply route daily. 1 kit 3  . Blood Pressure KIT 1 Device by Does not apply route daily. 1 each 0  . carvedilol (COREG) 25 MG tablet TAKE 1/2 TABLET (12.5 MG TOTAL) 2 TIMES DAILY WITH A MEAL. 90 tablet 3  . cholecalciferol (VITAMIN D) 1000 UNITS tablet   Take 1,000 Units by mouth daily.    . diclofenac sodium (VOLTAREN) 1 % GEL Apply 4 g topically 4 (four) times daily. 2 Tube 2  . fluticasone (FLONASE) 50 MCG/ACT nasal spray Place 2 sprays into both nostrils daily. 16 g 2  . hydrALAZINE (APRESOLINE) 25 MG tablet Take 1 tablet (25 mg total) by mouth 4 (four) times daily.  360 tablet 3  . hydrocortisone (ANUSOL-HC) 2.5 % rectal cream Place 1 application rectally 2 (two) times daily. 30 g 3  . letrozole (FEMARA) 2.5 MG tablet Take 1 tablet (2.5 mg total) by mouth daily. 90 tablet 3  . loratadine (CLARITIN) 10 MG tablet Take 1 tablet (10 mg total) by mouth daily. 30 tablet 2  . LORazepam (ATIVAN) 1 MG tablet Take 1 tablet (1 mg total) by mouth as needed. This is a 30 day supply 30 tablet 5  . metFORMIN (GLUCOPHAGE) 500 MG tablet TAKE 1 TABLET EVERY DAY WITH BREAKFAST 90 tablet 3  . Multiple Vitamin (MULTIVITAMIN) tablet Take 1 tablet by mouth daily.     . olmesartan (BENICAR) 20 MG tablet Take 1 tablet (20 mg total) by mouth daily. 90 tablet 3  . pantoprazole (PROTONIX) 40 MG tablet Take 1 tablet (40 mg total) by mouth daily. 90 tablet 3  . PRODIGY NO CODING BLOOD GLUC test strip 1 each by Other route daily. 100 each 11  . Simethicone 125 MG CAPS Take 1 capsule (125 mg total) by mouth 4 (four) times daily as needed (gas, bloating). 90 each 2  . simvastatin (ZOCOR) 20 MG tablet TAKE 1 TABLET AT BEDTIME 90 tablet 3  . spironolactone (ALDACTONE) 25 MG tablet TAKE 1 TABLET DAILY. 90 tablet 3  . traMADol (ULTRAM) 50 MG tablet TAKE 1 TABLET EVERY DAY AS NEEDED 90 tablet 0  . triamcinolone cream (KENALOG) 0.1 % Apply topically 2 (two) times daily as needed. 45 g 0  . venlafaxine (EFFEXOR) 37.5 MG tablet Take 1 tablet (37.5 mg total) by mouth daily. (Patient taking differently: Take 37.5 mg by mouth daily as needed. ) 90 tablet 1   No current facility-administered medications for this visit.     PHYSICAL EXAMINATION: ECOG PERFORMANCE STATUS: 1 - Symptomatic but completely ambulatory  There were no vitals filed for this visit. There were no vitals filed for this visit.  GENERAL:alert, no distress and comfortable SKIN: skin color, texture, turgor are normal, no rashes or significant lesions EYES: normal, Conjunctiva are pink and non-injected, sclera  clear OROPHARYNX:no exudate, no erythema and lips, buccal mucosa, and tongue normal  NECK: supple, thyroid normal size, non-tender, without nodularity LYMPH:  no palpable lymphadenopathy in the cervical, axillary or inguinal LUNGS: clear to auscultation and percussion with normal breathing effort HEART: regular rate & rhythm and no murmurs and no lower extremity edema ABDOMEN:abdomen soft, non-tender and normal bowel sounds MUSCULOSKELETAL:no cyanosis of digits and no clubbing  NEURO: alert & oriented x 3 with fluent speech, no focal motor/sensory deficits EXTREMITIES: No lower extremity edema BREAST: No palpable masses or nodules in chest wall or axilla. (exam performed in the presence of a chaperone)  LABORATORY DATA:  I have reviewed the data as listed   Chemistry      Component Value Date/Time   NA 144 05/29/2015 1420   NA 144 07/31/2014 1006   K 4.7 05/29/2015 1420   K 4.7 07/31/2014 1006   CL 103 05/29/2015 1420   CL 103 04/25/2012 1045   CO2 21 05/29/2015 1420     CO2 23 07/31/2014 1006   BUN 19 05/29/2015 1420   BUN 39.1 (H) 07/31/2014 1006   CREATININE 1.55 (H) 05/29/2015 1420   CREATININE 1.10 09/26/2014 1420   CREATININE 2.2 (H) 07/31/2014 1006      Component Value Date/Time   CALCIUM 10.2 05/29/2015 1420   CALCIUM 9.6 07/31/2014 1006   ALKPHOS 64 07/31/2014 1006   AST 17 07/31/2014 1006   ALT 20 07/31/2014 1006   BILITOT 0.37 07/31/2014 1006       Lab Results  Component Value Date   WBC 6.7 09/04/2015   HGB 10.2 (L) 07/31/2014   HCT 30.9 (L) 09/04/2015   MCV 88 09/04/2015   PLT 220 09/04/2015   NEUTROABS 3.1 07/31/2014    ASSESSMENT & PLAN:  Breast cancer of upper-outer quadrant of left female breast (HCC) Left breast invasive ductal carcinoma stage II a ER/PR positive HER-2 positive status post bilateral mastectomies (Right CVA, Rt IJ DVT) Followed by 6 cycles of TCH followed by Herceptin maintenance completed 04/17/2011   Current treatment:  letrozole 2.5 mg daily started June 2012 completed June 2017 We previously discussed the pros and cons of extended adjuvant therapy. Breast cancer index revealed that the patient has high risk of recurrence at 10% between your 5-10. However she has no probability of benefit from extended adjuvant therapy. Based on this result she discontinued antiestrogen therapy June 2017.  Breast Cancer Surveillance: 1. Breast exam 08/03/2016: Normal 2. No role of imaging studies since she had bilateral mastectomies 3. Bone density June 2015: normal T score +2.2   Return to clinic in 1 year for follow-up With survivorship clinic  This is a lady regular shot of Rocephin and then symptoms I spent 25 minutes talking to the patient of which more than half was spent in counseling and coordination of care.  No orders of the defined types were placed in this encounter.  The patient has a good understanding of the overall plan. she agrees with it. she will call with any problems that may develop before the next visit here.   Gudena, Vinay K, MD 08/03/16    

## 2016-08-05 ENCOUNTER — Ambulatory Visit (INDEPENDENT_AMBULATORY_CARE_PROVIDER_SITE_OTHER): Payer: Medicare HMO | Admitting: Internal Medicine

## 2016-08-05 ENCOUNTER — Encounter: Payer: Self-pay | Admitting: Internal Medicine

## 2016-08-05 VITALS — BP 137/73 | HR 82 | Temp 98.0°F | Wt 236.0 lb

## 2016-08-05 DIAGNOSIS — E1169 Type 2 diabetes mellitus with other specified complication: Secondary | ICD-10-CM

## 2016-08-05 DIAGNOSIS — E785 Hyperlipidemia, unspecified: Secondary | ICD-10-CM

## 2016-08-05 DIAGNOSIS — Z79899 Other long term (current) drug therapy: Secondary | ICD-10-CM | POA: Diagnosis not present

## 2016-08-05 DIAGNOSIS — E784 Other hyperlipidemia: Secondary | ICD-10-CM | POA: Diagnosis not present

## 2016-08-05 DIAGNOSIS — I10 Essential (primary) hypertension: Secondary | ICD-10-CM

## 2016-08-05 NOTE — Progress Notes (Signed)
   CC: HTN f/up  HPI:  [name] is a 66 y.o. with pmh as listed below is here for HTN f/up.   Past Medical History:  Diagnosis Date  . Arthritis    DJD, lumbar spondylosis  . Asthma   . Back pain   . Bilateral renal cysts   . Breast cancer ([place])    left breast invasive ductal ca in remission as of 10/07/11 s/p double mastectomy  . Bronchitis 03/10/2012  . Cataract   . Chemotherapy follow-up examination 03/06/2011  . Colon polyps   . Cough 03/06/2011  . Depression    seasonal melancholia  . E. coli UTI   . Edema extremities   . GERD (gastroesophageal reflux disease)   . Hyperlipidemia   . Hypertension   . Ileitis   . Kidney stones    non obstructive   . Myopathy    not statin related, cause unknown    HTN - last visit BP was 141/78, no BP med changes were made because home BP readiings were in 120-130's per report at that time. Was given BP log to continue to record home BP readings.  On spironolactone 25 mg daily + olmesartan 20mg  daily + hydral 25 mg 4 times daily + coreg 12.5mg  bid. BP is 137/73 today. Home log showed BP runs 110s to 130's. No complaints today.   Review of Systems  Constitutional: Negative for chills and fever.  Cardiovascular: Negative for chest pain.  Gastrointestinal: Negative for heartburn, nausea and vomiting.  Neurological: Negative for dizziness and headaches.   Physical Exam:  Vitals:   08/05/16 1029  BP: 137/73  Pulse: 82  Temp: 98 F (36.7 C)  TempSrc: Oral  SpO2: 100%  Weight: 236 lb (107 kg)   Physical Exam  Constitutional: She is oriented to person, place, and time. She appears well-developed and well-nourished. No distress.  HENT:  Head: Normocephalic and atraumatic.  Cardiovascular: Normal rate and regular rhythm.  Exam reveals no gallop and no friction rub.   No murmur heard. Respiratory: Effort normal and breath sounds normal. No respiratory distress. She has no wheezes.  Musculoskeletal: Normal range of  motion. She exhibits no edema.  Neurological: She is alert and oriented to person, place, and time. No cranial nerve deficit. Coordination normal.  Skin: She is not diaphoretic.  Psychiatric: She has a normal mood and affect.     Assessment & Plan:   See Encounters Tab for problem based charting.  Patient discussed with Dr. Angelia Mould

## 2016-08-05 NOTE — Patient Instructions (Addendum)
Continue your current medications  Will check your kidney lab and cholesterol today.  Follow up in 3 months.

## 2016-08-05 NOTE — Assessment & Plan Note (Signed)
Lipid Panel     Component Value Date/Time   CHOL 225 (H) 08/23/2014 1505   TRIG 357 (H) 08/23/2014 1505   HDL 41 (L) 08/23/2014 1505   CHOLHDL 5.5 08/23/2014 1505   VLDL 71 (H) 08/23/2014 1505   LDLCALC 113 (H) 08/23/2014 1505   Last LDL 113. On simvastatin 20mg  daily. Will recheck lipid today.

## 2016-08-05 NOTE — Assessment & Plan Note (Signed)
Vitals:   08/05/16 1029  BP: 137/73  Pulse: 82  Temp: 98 F (36.7 C)   On spironolactone 25 mg daily + olmesartan 20mg  daily + hydral 25 mg 4 times daily + coreg 12.5mg  bid. BP is 137/73 today. Home log showed BP runs 110s to 130's. No complaints today. Has no trouble taking her meds, even the 4x daily hydral (sets alarm).   Will check BMET today. Cont current medications.

## 2016-08-06 LAB — BASIC METABOLIC PANEL
BUN/Creatinine Ratio: 12 (ref 12–28)
BUN: 20 mg/dL (ref 8–27)
CO2: 26 mmol/L (ref 18–29)
Calcium: 10.7 mg/dL — ABNORMAL HIGH (ref 8.7–10.3)
Chloride: 103 mmol/L (ref 96–106)
Creatinine, Ser: 1.71 mg/dL — ABNORMAL HIGH (ref 0.57–1.00)
GFR calc Af Amer: 35 mL/min/{1.73_m2} — ABNORMAL LOW (ref >59)
GFR calc non Af Amer: 31 mL/min/{1.73_m2} — ABNORMAL LOW (ref >59)
Glucose: 127 mg/dL — ABNORMAL HIGH (ref 65–99)
Potassium: 4.5 mmol/L (ref 3.5–5.2)
Sodium: 144 mmol/L (ref 134–144)

## 2016-08-06 LAB — LIPID PANEL
Chol/HDL Ratio: 3.9 ratio (ref 0.0–4.4)
Cholesterol, Total: 193 mg/dL (ref 100–199)
HDL: 49 mg/dL (ref >39)
LDL Calculated: 78 mg/dL (ref 0–99)
Triglycerides: 329 mg/dL — ABNORMAL HIGH (ref 0–149)
VLDL Cholesterol Cal: 66 mg/dL — ABNORMAL HIGH (ref 5–40)

## 2016-08-07 NOTE — Addendum Note (Signed)
Addended by: Dellia Nims on: 08/07/2016 02:08 PM   Modules accepted: Orders

## 2016-08-07 NOTE — Progress Notes (Signed)
Internal Medicine Clinic Attending  Case discussed with Dr. Ahmed at the time of the visit.  We reviewed the resident's history and exam and pertinent patient test results.  I agree with the assessment, diagnosis, and plan of care documented in the resident's note. 

## 2016-08-07 NOTE — Assessment & Plan Note (Signed)
BMET showed elevated Ca 10.7, has been slowing trending up.   Will add future lab order for Vitamin D level and PTH level. Will have her come back for lab only visit.

## 2016-08-20 ENCOUNTER — Other Ambulatory Visit (INDEPENDENT_AMBULATORY_CARE_PROVIDER_SITE_OTHER): Payer: Medicare HMO

## 2016-08-21 LAB — PTH, INTACT AND CALCIUM
Calcium: 10.3 mg/dL (ref 8.7–10.3)
PTH: 42 pg/mL (ref 15–65)

## 2016-08-21 LAB — VITAMIN D 25 HYDROXY (VIT D DEFICIENCY, FRACTURES): Vit D, 25-Hydroxy: 36.1 ng/mL (ref 30.0–100.0)

## 2016-08-26 ENCOUNTER — Encounter: Payer: Self-pay | Admitting: *Deleted

## 2016-10-19 ENCOUNTER — Ambulatory Visit (INDEPENDENT_AMBULATORY_CARE_PROVIDER_SITE_OTHER): Payer: Medicare HMO | Admitting: Internal Medicine

## 2016-10-19 ENCOUNTER — Encounter: Payer: Self-pay | Admitting: Internal Medicine

## 2016-10-19 VITALS — BP 110/62 | HR 79 | Temp 98.2°F | Wt 235.2 lb

## 2016-10-19 DIAGNOSIS — R197 Diarrhea, unspecified: Secondary | ICD-10-CM | POA: Diagnosis not present

## 2016-10-19 DIAGNOSIS — K635 Polyp of colon: Secondary | ICD-10-CM | POA: Diagnosis not present

## 2016-10-19 DIAGNOSIS — Z7982 Long term (current) use of aspirin: Secondary | ICD-10-CM

## 2016-10-19 DIAGNOSIS — Z79899 Other long term (current) drug therapy: Secondary | ICD-10-CM

## 2016-10-19 DIAGNOSIS — I10 Essential (primary) hypertension: Secondary | ICD-10-CM

## 2016-10-19 DIAGNOSIS — Z8249 Family history of ischemic heart disease and other diseases of the circulatory system: Secondary | ICD-10-CM | POA: Diagnosis not present

## 2016-10-19 NOTE — Progress Notes (Signed)
CC: acute diarrhea   HPI:  [name] is a 66 y.o. with PMH as listed below who presents for acute concern of diarrhea. Please see the assessment and plans for the status of the patient chronic medical problems.   Past Medical History:  Diagnosis Date  . Arthritis    DJD, lumbar spondylosis  . Asthma   . Back pain   . Bilateral renal cysts   . Breast cancer ([place])    left breast invasive ductal ca in remission as of 10/07/11 s/p double mastectomy  . Bronchitis 03/10/2012  . Cataract   . Chemotherapy follow-up examination 03/06/2011  . Colon polyps   . Cough 03/06/2011  . Depression    seasonal melancholia  . E. coli UTI   . Edema extremities   . GERD (gastroesophageal reflux disease)   . Hyperlipidemia   . Hypertension   . Ileitis   . Kidney stones    non obstructive   . Myopathy    not statin related, cause unknown    Review of Systems:  Refer to history of present illness and assessment and plans for pertinent review of systems, all others reviewed and negative  Physical Exam:  Vitals:   10/19/16 1538  BP: 110/62  Pulse: 79  Temp: 98.2 F (36.8 C)  TempSrc: Oral  SpO2: 100%  Weight: 235 lb 3.2 oz (106.7 kg)   Physical Exam  Constitutional: She is well-developed, well-nourished, and in no distress. No distress.  Cardiovascular: Normal rate and regular rhythm.   No murmur heard. Abdominal: Soft. Bowel sounds are normal. She exhibits no distension. There is no tenderness. There is no guarding.  Skin: She is not diaphoretic.   Assessment & Plan:   Acute diarrhea Patient presents with diarrhea which started on Friday after she ate an extra cheese and pizza. She has had 2 watery non- bloody bowel movements per day. The movements were preceded by cramping abdominal pain which resolved after she had a bowel movement or pass gas. She denies nausea or changes in appetite. She does not drink much milk but eats a lot of cheese. She tried Imodium and her  symptoms were somewhat resolved. I suspect this may be related to gastroenteritis with possible residual lactose intolerance.  - Recommended avoiding milk and cheeses -Continue loperamide and Gas-X for symptomatic relief - Follow-up scheduled in one week  Hypotension Today she is hypotensive and has positive orthostatics. She describes dizziness with standing or walking around for extended periods of time. She has had dizziness with walking around in the store and had to leave and go home to lie down. He is here today for acute diarrheal illness but says that she's continued to maintain a good by mouth intake. She's had difficult to control hypertension and reports that it's hard for her to remember to take hydralazine 4 times a day, she actually only takes 3 times a day. - Orthostatic vital signs positive, SBP dropped from 119 lying down to 88 standing  - hold hydralazine for one week until acute illness resolves   Colon polyps  Last colonoscopy 10/2011 he had 2 sessile polyps removed. Pathology of one of the polyp showed a tubular adenoma and the other was hyperplastic. A repeat colonoscopy was recommended in 5 years. She is due for repeat colonoscopy at this time. Denies constipation, melena, or bright red blood per rectum. -Referral to Dr. Benson Norway for follow up colonoscopy   See Encounters Tab for problem based charting.  Patient  discussed with Dr. Evette Doffing

## 2016-10-19 NOTE — Patient Instructions (Addendum)
[name],  Avoid eating cheese until your symptoms get better. Try using gas-x and imodium for your symptoms.   For your low blood pressure, hold off on taking the hydralazine for the next week   I will refer you for a colonoscopy, call the clinic if you have not heard back about this in 2 weeks   Schedule an appointment to see Dr. Danford Bad in September   Call the clinic if your symptoms are not better with avoiding dairy products in one to two weeks

## 2016-10-19 NOTE — Assessment & Plan Note (Signed)
Today she is hypotensive and has positive orthostatics. She describes dizziness with standing or walking around for extended periods of time. She has had dizziness with walking around in the store and had to leave and go home to lie down. He is here today for acute diarrheal illness but says that she's continued to maintain a good by mouth intake. She's had difficult to control hypertension and reports that it's hard for her to remember to take hydralazine 4 times a day, she actually only takes 3 times a day. - Orthostatic vital signs positive, SBP dropped from 119 lying down to 88 standing  - hold hydralazine for one week until acute illness resolves

## 2016-10-19 NOTE — Assessment & Plan Note (Signed)
Patient presents with diarrhea which started on Friday after she ate an extra cheese and pizza. She has had 2 watery non- bloody bowel movements per day. The movements were preceded by cramping abdominal pain which resolved after she had a bowel movement or pass gas. She denies nausea or changes in appetite. She does not drink much milk but eats a lot of cheese. She tried Imodium and her symptoms were somewhat resolved. I suspect this may be related to gastroenteritis with possible residual lactose intolerance.  - Recommended avoiding milk and cheeses -Continue loperamide and Gas-X for symptomatic relief - Follow-up scheduled in one week

## 2016-10-19 NOTE — Assessment & Plan Note (Signed)
Last colonoscopy 10/2011 he had 2 sessile polyps removed. Pathology of one of the polyp showed a tubular adenoma and the other was hyperplastic. A repeat colonoscopy was recommended in 5 years. She is due for repeat colonoscopy at this time. Denies constipation, melena, or bright red blood per rectum. -Referral to Dr. Benson Norway for follow up colonoscopy

## 2016-10-20 ENCOUNTER — Other Ambulatory Visit: Payer: Self-pay | Admitting: Internal Medicine

## 2016-10-20 DIAGNOSIS — R0789 Other chest pain: Secondary | ICD-10-CM

## 2016-10-20 NOTE — Progress Notes (Signed)
Internal Medicine Clinic Attending  Case discussed with Dr. Blum at the time of the visit.  We reviewed the resident's history and exam and pertinent patient test results.  I agree with the assessment, diagnosis, and plan of care documented in the resident's note. 

## 2016-10-21 ENCOUNTER — Other Ambulatory Visit: Payer: Self-pay | Admitting: *Deleted

## 2016-10-21 DIAGNOSIS — E119 Type 2 diabetes mellitus without complications: Secondary | ICD-10-CM

## 2016-10-21 DIAGNOSIS — I10 Essential (primary) hypertension: Secondary | ICD-10-CM

## 2016-10-23 ENCOUNTER — Other Ambulatory Visit: Payer: Self-pay | Admitting: *Deleted

## 2016-10-23 DIAGNOSIS — M1711 Unilateral primary osteoarthritis, right knee: Secondary | ICD-10-CM

## 2016-10-23 MED ORDER — PRODIGY NO CODING BLOOD GLUC VI STRP: 1.0000 | ORAL_STRIP | Freq: Every day | 11 refills | Status: DC

## 2016-10-23 MED ORDER — BD SWAB SINGLE USE REGULAR PADS: 1.0000 "application " | MEDICATED_PAD | 6 refills | Status: DC

## 2016-10-23 MED ORDER — PRODIGY AUTOCODE BLOOD GLUCOSE W/DEVICE KIT: 1.0000 | PACK | Freq: Once | 0 refills | Status: DC

## 2016-10-23 MED ORDER — PRODIGY TWIST TOP LANCETS 28G MISC: 0 refills | Status: DC

## 2016-10-23 MED ORDER — PRODIGY CONTROL SOLUTION LOW VI SOLN: 1.0000 | Freq: Every morning | 0 refills | Status: DC

## 2016-10-23 MED ORDER — LORAZEPAM 1 MG PO TABS: 1.0000 mg | ORAL_TABLET | ORAL | 5 refills | Status: DC | PRN

## 2016-10-23 MED ORDER — HYDRALAZINE HCL 25 MG PO TABS: 25.0000 mg | ORAL_TABLET | Freq: Four times a day (QID) | ORAL | 3 refills | Status: DC

## 2016-10-23 MED ORDER — METFORMIN HCL 500 MG PO TABS: ORAL_TABLET | ORAL | 3 refills | Status: DC

## 2016-10-23 NOTE — Telephone Encounter (Signed)
Lorazepam called in to [facility].

## 2016-10-27 ENCOUNTER — Encounter: Payer: Medicare HMO | Admitting: Internal Medicine

## 2016-10-27 ENCOUNTER — Encounter: Payer: Self-pay | Admitting: Internal Medicine

## 2016-10-27 ENCOUNTER — Ambulatory Visit (INDEPENDENT_AMBULATORY_CARE_PROVIDER_SITE_OTHER): Payer: Medicare HMO | Admitting: Internal Medicine

## 2016-10-27 VITALS — BP 118/61 | HR 73 | Temp 98.1°F | Ht 68.0 in | Wt 239.7 lb

## 2016-10-27 DIAGNOSIS — I10 Essential (primary) hypertension: Secondary | ICD-10-CM

## 2016-10-27 DIAGNOSIS — Z7984 Long term (current) use of oral hypoglycemic drugs: Secondary | ICD-10-CM

## 2016-10-27 DIAGNOSIS — J302 Other seasonal allergic rhinitis: Secondary | ICD-10-CM

## 2016-10-27 DIAGNOSIS — E1122 Type 2 diabetes mellitus with diabetic chronic kidney disease: Secondary | ICD-10-CM

## 2016-10-27 DIAGNOSIS — N183 Chronic kidney disease, stage 3 unspecified: Secondary | ICD-10-CM

## 2016-10-27 DIAGNOSIS — E784 Other hyperlipidemia: Secondary | ICD-10-CM

## 2016-10-27 DIAGNOSIS — E1169 Type 2 diabetes mellitus with other specified complication: Secondary | ICD-10-CM

## 2016-10-27 DIAGNOSIS — I130 Hypertensive heart and chronic kidney disease with heart failure and stage 1 through stage 4 chronic kidney disease, or unspecified chronic kidney disease: Secondary | ICD-10-CM | POA: Diagnosis not present

## 2016-10-27 DIAGNOSIS — Z79899 Other long term (current) drug therapy: Secondary | ICD-10-CM

## 2016-10-27 DIAGNOSIS — J45991 Cough variant asthma: Secondary | ICD-10-CM | POA: Diagnosis not present

## 2016-10-27 DIAGNOSIS — Z7982 Long term (current) use of aspirin: Secondary | ICD-10-CM | POA: Diagnosis not present

## 2016-10-27 DIAGNOSIS — I5032 Chronic diastolic (congestive) heart failure: Secondary | ICD-10-CM

## 2016-10-27 DIAGNOSIS — K120 Recurrent oral aphthae: Secondary | ICD-10-CM

## 2016-10-27 DIAGNOSIS — Z79891 Long term (current) use of opiate analgesic: Secondary | ICD-10-CM

## 2016-10-27 DIAGNOSIS — M1711 Unilateral primary osteoarthritis, right knee: Secondary | ICD-10-CM

## 2016-10-27 DIAGNOSIS — E119 Type 2 diabetes mellitus without complications: Secondary | ICD-10-CM

## 2016-10-27 DIAGNOSIS — E785 Hyperlipidemia, unspecified: Secondary | ICD-10-CM

## 2016-10-27 LAB — POCT GLYCOSYLATED HEMOGLOBIN (HGB A1C): Hemoglobin A1C: 6.9

## 2016-10-27 LAB — GLUCOSE, CAPILLARY: Glucose-Capillary: 81 mg/dL (ref 65–99)

## 2016-10-27 MED ORDER — TRIAMCINOLONE ACETONIDE 0.1 % MT PSTE: 1.0000 "application " | PASTE | Freq: Two times a day (BID) | OROMUCOSAL | 12 refills | Status: DC

## 2016-10-27 MED ORDER — HYDRALAZINE HCL 25 MG PO TABS: 25.0000 mg | ORAL_TABLET | Freq: Two times a day (BID) | ORAL | 3 refills | Status: DC

## 2016-10-27 MED ORDER — LORATADINE 10 MG PO TABS: 10.0000 mg | ORAL_TABLET | Freq: Every day | ORAL | 2 refills | Status: DC

## 2016-10-27 NOTE — Assessment & Plan Note (Signed)
Creatinine 07/2016 was 1.7 with baseline 1.2 - 1.6 since 2007.  - Continue to monitor  - continue blood pressure and diabetes management

## 2016-10-27 NOTE — Assessment & Plan Note (Signed)
She's taking moderate intensity simvastatin for risk factors associated with DM. Reports no adverse effect such as myalgia. - Continue simvastatin 20 mg daily

## 2016-10-27 NOTE — Assessment & Plan Note (Signed)
Patient has persistent symptoms of seasonal allergies despite taking Flonase daily. Her symptoms come in the form of shortness of breath without wheezing and she has been needing to use the albuterol inhaler more frequently (every other day). On prior methacholine challenge in 2009 she had hyperreactivity and was advised to take Singulair, spirometry at this time showed no obstruction however she's been on albuterol for years. Assessment: Poorly controlled seasonal allergies with a component of airway hyper reactivity which is responding to albuterol. - prescribed claritin to be taken daily  - continue flonase  - Continue albuterol as needed - consider adding singulair if her symptoms are not controlled at follow up

## 2016-10-27 NOTE — Assessment & Plan Note (Addendum)
LShe denies chest pain, orthopnea, or peripheral edema. Does have dyspnea on exertion which responds to albuterol. Euvolemic on exam today.  - continue aspirin 81 mg daily

## 2016-10-27 NOTE — Patient Instructions (Addendum)
It was a pleasure to see you today Ms. Jake,   For your allergies - start taking claritin   For the sores in your mouth, try using triamcinolone paste and try not to bite on your cheek any more. If this doesn't get better we should have a dentist take a second look at it.   Schedule a follow up visit with me in 6 months

## 2016-10-27 NOTE — Assessment & Plan Note (Addendum)
Patient reports no pain related this for a while, she has not needed voltaren or tramadol for a long time.  - continue to monitor

## 2016-10-27 NOTE — Assessment & Plan Note (Addendum)
BP Readings from Last 3 Encounters:  10/27/16 118/61  10/19/16 110/62  08/05/16 137/73  In the [facility] last week she was hypotensive and had orthostatic dizziness related to diarrhea and dehydration. She was told to stop taking hydralazine for a week then resume her dose. She has resumed taking the hydralazine just twice daily and is no longer feeling dizzy.  Assessment: Below goal for her blood pressure today and without dizziness. Her blood pressures been difficult to control in the past so I will avoid changing medications today and risking her BP going above goal.  - Continue hydralazine 25 mg twice daily, spironolactone 25 mg daily, olmesartan 20 mg daily and carvedilol 12.5 mg twice daily

## 2016-10-27 NOTE — Progress Notes (Addendum)
CC: Follow up of diabetes, hypertension, seasonal allergies, hyperlipidemia and mouth sores   HPI:  [name] is a 66 y.o.  with PMH as listed below who presents for follow upFollow up of diabetes, hypertension, seasonal allergies, hyperlipidemia and mouth sores . Please see the assessment and plans for the status of the patient chronic medical problems.   Past Medical History:  Diagnosis Date  . Arthritis    DJD, lumbar spondylosis  . Asthma   . Back pain   . Bilateral renal cysts   . Breast cancer ([place])    left breast invasive ductal ca in remission as of 10/07/11 s/p double mastectomy  . Bronchitis 03/10/2012  . Cataract   . Chemotherapy follow-up examination 03/06/2011  . Colon polyps   . Cough 03/06/2011  . Depression    seasonal melancholia  . E. coli UTI   . Edema extremities   . GERD (gastroesophageal reflux disease)   . Hyperlipidemia   . Hypertension   . Ileitis   . Kidney stones    non obstructive   . Myopathy    not statin related, cause unknown    Review of Systems:  Refer to history of present illness and assessment and plans for pertinent review of systems, all others reviewed and negative  Physical Exam:  Vitals:   10/27/16 1443  BP: 118/61  Pulse: 73  Temp: 98.1 F (36.7 C)  TempSrc: Oral  SpO2: 100%  Weight: 239 lb 11.2 oz (108.7 kg)  Height: 5\' 8"  (1.727 m)   Physical Exam  Constitutional: She is well-developed, well-nourished, and in no distress. No distress.  HENT:  Head: Normocephalic and atraumatic.  Whitening and skin breakdown of the internal oral mucousa at the point where her upper and lower lip meet on the right   Cardiovascular: Normal rate and regular rhythm.   No murmur heard. No peripheral edema  Pulmonary/Chest: Effort normal and breath sounds normal. She has no wheezes. She has no rales.  Skin: She is not diaphoretic.   feet are well-groomed without ulcers or wounds    Assessment & Plan:   Type 2 diabetes  mellitus Last hemoglobin A1c in March 2018 was 6.9. Forgot to bring her meter today. She has good compliance with metformin and denies GI upset. - Hemoglobin A1c today is 6.9 - Continue metformin 500 mg daily - Follow-up hemoglobin A1c in 6 months  Oral ulcers Reports that she's had a bad habit of biting her inner cheek for years. She has no sensation of any lesion in her mouth or pain. She notices that the skin is broken down now and it has made it harder for her to stop biting and pulling at the broken down areas. It sounds as though there are some times when she is able to stop messing with her cheek and it gets better. On exam there is breakdown of the mucousa over the area of her inner cheek where she bites.  - Trial of topical triamterene, we discussed that if this in combination with her trying to stop aggravating the area does not work to improve the ulcers she will need to be evaluated by dentistry for oral cancer   Seasonal allergies, uncontrolled  Patient has persistent symptoms of seasonal allergies despite taking Flonase daily. Her symptoms come in the form of shortness of breath without wheezing and she has been needing to use the albuterol inhaler more frequently (every other day). On prior methacholine challenge in 2009 she had  hyperreactivity and was advised to take Singulair, spirometry at this time showed no obstruction however she's been on albuterol for years. Assessment: Poorly controlled seasonal allergies with a component of airway hyper reactivity which is responding to albuterol. - prescribed claritin to be taken daily  - continue flonase  - Continue albuterol as needed - consider adding singulair if her symptoms are not controlled at follow up   Hypertension  BP Readings from Last 3 Encounters:  10/27/16 118/61  10/19/16 110/62  08/05/16 137/73  In the [facility] last week she was hypotensive and had orthostatic dizziness related to diarrhea and dehydration. She was told to  stop taking hydralazine for a week then resume her dose. She has resumed taking the hydralazine just twice daily and is no longer feeling dizzy.  Assessment: Below goal for her blood pressure today and without dizziness. Her blood pressures been difficult to control in the past so I will avoid changing medications today and risking her BP going above goal.  - Continue hydralazine 25 mg twice daily, spironolactone 25 mg daily, olmesartan 20 mg daily and carvedilol 12.5 mg twice daily  Hyperlipidemia She's taking moderate intensity simvastatin for risk factors associated with DM. Reports no adverse effect such as myalgia. - Continue simvastatin 20 mg daily  Osteoarthritis of the right knee  Patient reports no pain related this for a while, she has not needed voltaren or tramadol for a long time. She receives a prescription for tramadol #90 about once yearly and this helps her to function.  - continue to monitor  -Received a refill request for tramadol after the patient left the office. I refilled tramadol 50 mg PRN #90. She will need an updated pain contract at follow up.   CKD, stage 3  Creatinine 07/2016 was 1.7 with baseline 1.2 - 1.6 since 2007.  - Continue to monitor  - continue blood pressure and diabetes management  Chronic diastolic CHF  She denies chest pain, orthopnea, or peripheral edema. Does have dyspnea on exertion which responds to albuterol. Euvolemic on exam today.  - continue aspirin 81 mg daily   See Encounters Tab for problem based charting.  Patient discussed with Dr. Lynnae January

## 2016-10-27 NOTE — Assessment & Plan Note (Signed)
Last hemoglobin A1c in March 2018 was 6.9. Forgot to bring her meter today. She has good compliance with metformin and denies GI upset. - Hemoglobin A1c today is 6.9 - Continue metformin 500 mg daily - Follow-up hemoglobin A1c in 6 months

## 2016-10-27 NOTE — Assessment & Plan Note (Signed)
Reports that she's had a bad habit of biting her inner cheek for years. She has no sensation of any lesion in her mouth or pain. She notices that the skin is broken down now and it has made it harder for her to stop biting and pulling at the broken down areas. It sounds as though there are some times when she is able to stop messing with her cheek and it gets better. On exam there is breakdown of the mucousa over the area of her inner cheek where she bites.  - Trial of topical triamterene, we discussed that if this in combination with her trying to stop aggravating the area does not work to improve the ulcers she will need to be evaluated by dentistry for oral cancer

## 2016-10-28 ENCOUNTER — Telehealth: Payer: Self-pay | Admitting: *Deleted

## 2016-10-28 NOTE — Telephone Encounter (Signed)
Pt needs PA for Prodigy Glucose testing Strips.  Patient's insurance does not cove will need to be changed to covered Glucose Meter that is covered.  Acc Check aor One Touch are the preferred types

## 2016-10-30 DIAGNOSIS — Z79891 Long term (current) use of opiate analgesic: Secondary | ICD-10-CM | POA: Insufficient documentation

## 2016-10-30 MED ORDER — TRAMADOL HCL 50 MG PO TABS: ORAL_TABLET | ORAL | 0 refills | Status: DC

## 2016-10-30 MED ORDER — CARVEDILOL 25 MG PO TABS: ORAL_TABLET | ORAL | 3 refills | Status: DC

## 2016-10-30 NOTE — Telephone Encounter (Signed)
Tramadol rx  30 tabs x 2 refills called to [facility] - per pharmacist, [facility] only covers 30 day supply on opioids and opioid-like meds.

## 2016-11-02 DIAGNOSIS — J45998 Other asthma: Secondary | ICD-10-CM | POA: Diagnosis not present

## 2016-11-02 DIAGNOSIS — Z800 Family history of malignant neoplasm of digestive organs: Secondary | ICD-10-CM | POA: Diagnosis not present

## 2016-11-02 DIAGNOSIS — Z86010 Personal history of colonic polyps: Secondary | ICD-10-CM | POA: Diagnosis not present

## 2016-11-02 DIAGNOSIS — E119 Type 2 diabetes mellitus without complications: Secondary | ICD-10-CM | POA: Diagnosis not present

## 2016-11-02 NOTE — Progress Notes (Signed)
Internal Medicine Clinic Attending  Case discussed with Dr. Blum at the time of the visit.  We reviewed the resident's history and exam and pertinent patient test results.  I agree with the assessment, diagnosis, and plan of care documented in the resident's note. 

## 2016-11-03 ENCOUNTER — Other Ambulatory Visit: Payer: Self-pay | Admitting: Internal Medicine

## 2016-11-04 ENCOUNTER — Other Ambulatory Visit: Payer: Self-pay | Admitting: *Deleted

## 2016-11-04 DIAGNOSIS — E119 Type 2 diabetes mellitus without complications: Secondary | ICD-10-CM

## 2016-11-04 NOTE — Telephone Encounter (Signed)
Patient's insurance does not cover [employer].  Patient's insurance will cover the AccuChek or the One Touch Meters .  A new prescription will need to be written for the Meter and the supplies as well.  Sander Nephew, RN 11/04/2016 1:58 PM

## 2016-11-06 MED ORDER — ACCU-CHEK FASTCLIX LANCETS MISC: 3 refills | Status: DC

## 2016-11-06 MED ORDER — ACCU-CHEK AVIVA PLUS W/DEVICE KIT: PACK | 0 refills | Status: DC

## 2016-11-06 MED ORDER — GLUCOSE BLOOD VI STRP: ORAL_STRIP | 3 refills | Status: DC

## 2016-11-06 NOTE — Telephone Encounter (Signed)
Hello Butch Penny... Can you help me with the prescriptions for this. Thanks!

## 2016-11-12 DIAGNOSIS — H2513 Age-related nuclear cataract, bilateral: Secondary | ICD-10-CM | POA: Diagnosis not present

## 2016-11-12 DIAGNOSIS — C50912 Malignant neoplasm of unspecified site of left female breast: Secondary | ICD-10-CM | POA: Diagnosis not present

## 2016-11-12 DIAGNOSIS — E119 Type 2 diabetes mellitus without complications: Secondary | ICD-10-CM | POA: Diagnosis not present

## 2016-11-12 DIAGNOSIS — H43812 Vitreous degeneration, left eye: Secondary | ICD-10-CM | POA: Diagnosis not present

## 2016-11-17 DIAGNOSIS — Z86010 Personal history of colonic polyps: Secondary | ICD-10-CM | POA: Diagnosis not present

## 2016-11-17 DIAGNOSIS — D124 Benign neoplasm of descending colon: Secondary | ICD-10-CM | POA: Diagnosis not present

## 2016-11-17 DIAGNOSIS — K635 Polyp of colon: Secondary | ICD-10-CM | POA: Diagnosis not present

## 2016-11-17 DIAGNOSIS — D123 Benign neoplasm of transverse colon: Secondary | ICD-10-CM | POA: Diagnosis not present

## 2016-12-15 ENCOUNTER — Other Ambulatory Visit: Payer: Self-pay | Admitting: Internal Medicine

## 2016-12-15 NOTE — Telephone Encounter (Signed)
PATIENT HAS BEEN OFF HER HEARTBURN MEDICATION, HAVING HEARTBURN AGAIN WOULD LIKE REFILL ON PANTOPRAZOLE TO [facility] [phone]

## 2016-12-16 MED ORDER — PANTOPRAZOLE SODIUM 40 MG PO TBEC: 40.0000 mg | DELAYED_RELEASE_TABLET | Freq: Every day | ORAL | 3 refills | Status: DC

## 2017-01-04 ENCOUNTER — Ambulatory Visit (INDEPENDENT_AMBULATORY_CARE_PROVIDER_SITE_OTHER): Payer: Medicare HMO | Admitting: *Deleted

## 2017-01-04 DIAGNOSIS — Z23 Encounter for immunization: Secondary | ICD-10-CM

## 2017-01-19 ENCOUNTER — Telehealth: Payer: Self-pay | Admitting: Pulmonary Disease

## 2017-01-19 NOTE — Telephone Encounter (Signed)
Called and spoke with pt and she is aware of appt with TP at 1145 in the morning. Nothing further is needed.

## 2017-01-19 NOTE — Telephone Encounter (Signed)
Spoke with pt, she is having SOB for a few days. She has been using her inhaler and her medication as prescribed. She feels she is unable to shake this. She doesn't have any other symptoms. She feels like her heart is racing when she is SOB but she stops and tried to calm down to decrease the feeling. She states the albuterol inhaler is not helping but she is not taking her Advair and states she has not taken this in over a year. \  [facility] [phone]

## 2017-01-19 NOTE — Telephone Encounter (Signed)
Does not sound like asthma She will need OV with NP for evaluation

## 2017-01-20 ENCOUNTER — Ambulatory Visit (INDEPENDENT_AMBULATORY_CARE_PROVIDER_SITE_OTHER): Payer: Medicare HMO | Admitting: Adult Health

## 2017-01-20 ENCOUNTER — Encounter: Payer: Self-pay | Admitting: Adult Health

## 2017-01-20 ENCOUNTER — Telehealth (HOSPITAL_COMMUNITY): Payer: Self-pay | Admitting: Internal Medicine

## 2017-01-20 VITALS — BP 130/70 | HR 86 | Ht 68.0 in | Wt 231.0 lb

## 2017-01-20 DIAGNOSIS — R0602 Shortness of breath: Secondary | ICD-10-CM

## 2017-01-20 DIAGNOSIS — J45991 Cough variant asthma: Secondary | ICD-10-CM

## 2017-01-20 DIAGNOSIS — J302 Other seasonal allergic rhinitis: Secondary | ICD-10-CM

## 2017-01-20 DIAGNOSIS — R002 Palpitations: Secondary | ICD-10-CM

## 2017-01-20 DIAGNOSIS — J309 Allergic rhinitis, unspecified: Secondary | ICD-10-CM | POA: Insufficient documentation

## 2017-01-20 LAB — NITRIC OXIDE: Nitric Oxide: 49

## 2017-01-20 MED ORDER — PREDNISONE 10 MG PO TABS: ORAL_TABLET | ORAL | 0 refills | Status: DC

## 2017-01-20 NOTE — Progress Notes (Signed)
 @Patient ID: [name], female    DOB: 10/04/1950, 66 y.o.   MRN: [id number]  Chief Complaint  Patient presents with  . Acute Visit    Asthma     Referring provider: Blum, Nina, MD  HPI: 66-year old female never smoker followed for cough variant asthma, chronic rhinitis and GERD Patient has a history of breast cancer status post chemo History of grade 1 diastolic dysfunction History of obstructive sleep apnea   Significant tests/ events  Methacholine challenge 11/2007 drop in smaller airways >>added singulair  spirometry 11/2007 wnl &11/2010 -no obstruction.  Head CT 9/12  Ethmoid sinus mucosal thickening and bubbly opacity in the left sphenoid  PSG 06/2012 showed mild OSA - she felt very claustrophobic,she could not afford the CPAP - hence abandoned   01/20/2017 Acute OV :  Patient presents for an acute office visit.  She complains of 1 week of increased shortness of breath and wheezing . She had new furniture delivered last week and it has a strong stain smell. She also has felt intermittent episodes tachycardia/palpitations , heart racing . Rare albuterol use. No associated chest pain , exertional chest pain, jaw or shoulder pain., diaphoresis , back pain or syncope. Has minimal dry cough . No fever, or discolored mucus . Has some watery eyes. FENO today is 49.  EKG today shows NSR .  She is followed at cardiology for Diastolic dysfunction .    Allergies  Allergen Reactions  . Atorvastatin     REACTION: Nausea, abdominal pain, and reflux. Pt has used the medication in the past and experienced the same reaction before the medication was discontinued.  . Hctz [Hydrochlorothiazide]     Blurry vision  . Norvasc [Amlodipine Besylate]     Blurry vision  . Rosuvastatin     REACTION: Itches    Immunization History  Administered Date(s) Administered  . Influenza Whole 02/01/2006, 01/21/2007, 12/30/2007, 02/20/2009, 12/30/2009, 12/22/2010, 11/27/2011  .  Influenza,inj,Quad PF,6+ Mos 12/07/2012, 11/29/2013, 12/17/2014, 11/11/2015, 01/04/2017  . Pneumococcal Conjugate-13 01/18/2014  . Pneumococcal Polysaccharide-23 02/21/2004, 03/23/2010, 05/29/2015  . Td 02/20/2002  . Tdap 06/14/2012    Past Medical History:  Diagnosis Date  . Arthritis    DJD, lumbar spondylosis  . Asthma   . Back pain   . Bilateral renal cysts   . Breast cancer ([facility])    left breast invasive ductal ca in remission as of 10/07/11 s/p double mastectomy  . Bronchitis 03/10/2012  . Cataract   . Chemotherapy follow-up examination 03/06/2011  . Colon polyps   . Cough 03/06/2011  . Depression    seasonal melancholia  . E. coli UTI   . Edema extremities   . GERD (gastroesophageal reflux disease)   . Hyperlipidemia   . Hypertension   . Ileitis   . Kidney stones    non obstructive   . Myopathy    not statin related, cause unknown     Tobacco History: History  Smoking Status  . Never Smoker  Smokeless Tobacco  . Never Used   Counseling given: Not Answered   Outpatient Encounter Prescriptions as of 01/20/2017  Medication Sig  . ACCU-CHEK FASTCLIX LANCETS MISC Check blood sugar daily  . albuterol (VENTOLIN HFA) 108 (90 Base) MCG/ACT inhaler INHALE 1 TO 2 PUFFS INTO THE LUNGS EVERY 6 (SIX) HOURS AS NEEDED FOR WHEEZING OR SHORTNESS OF BREATH.  . aspirin 81 MG tablet Take 81 mg by mouth daily.  . Blood Glucose Calibration (PRODIGY CONTROL SOLUTION) Low SOLN   1 each by In Vitro route every morning.  . Blood Glucose Monitoring Suppl (ACCU-CHEK AVIVA PLUS) w/Device KIT Check blood sugar daily as intructed  . carvedilol (COREG) 25 MG tablet TAKE 1/2 TABLET TWICE DAILY WITH MEALS  . cholecalciferol (VITAMIN D) 1000 UNITS tablet Take 1,000 Units by mouth daily.  . fluticasone (FLONASE) 50 MCG/ACT nasal spray Place 2 sprays into both nostrils daily.  Marland Kitchen glucose blood (ACCU-CHEK AVIVA PLUS) test strip Check blood sugar one time a day as instructed  . hydrALAZINE  (APRESOLINE) 25 MG tablet Take 1 tablet (25 mg total) by mouth 2 (two) times daily.  Marland Kitchen loratadine (CLARITIN) 10 MG tablet Take 1 tablet (10 mg total) by mouth daily.  Marland Kitchen LORazepam (ATIVAN) 1 MG tablet Take 1 tablet (1 mg total) by mouth as needed. This is a 30 day supply  . metFORMIN (GLUCOPHAGE) 500 MG tablet TAKE 1 TABLET EVERY DAY WITH BREAKFAST  . Multiple Vitamin (MULTIVITAMIN) tablet Take 1 tablet by mouth daily.   Marland Kitchen olmesartan (BENICAR) 20 MG tablet TAKE 1 TABLET (20 MG TOTAL) BY MOUTH DAILY.  . pantoprazole (PROTONIX) 40 MG tablet Take 1 tablet (40 mg total) by mouth daily.  . simvastatin (ZOCOR) 20 MG tablet TAKE 1 TABLET AT BEDTIME  . spironolactone (ALDACTONE) 25 MG tablet TAKE 1 TABLET DAILY.  . traMADol (ULTRAM) 50 MG tablet TAKE 1 TABLET EVERY DAY AS NEEDED  . triamcinolone (KENALOG) 0.1 % paste Use as directed 1 application in the mouth or throat 2 (two) times daily.   No facility-administered encounter medications on file as of 01/20/2017.      Review of Systems  Constitutional:   No  weight loss, night sweats,  Fevers, chills, fatigue, or  lassitude.  HEENT:   No headaches,  Difficulty swallowing,  Tooth/dental problems, or  Sore throat,                No sneezing, itching, ear ache, nasal congestion,  +post nasal drip,   CV:  No chest pain,  Orthopnea, PND, swelling in lower extremities, anasarca, dizziness, palpitations, syncope.   GI  No heartburn, indigestion, abdominal pain, nausea, vomiting, diarrhea, change in bowel habits, loss of appetite, bloody stools.   Resp:    No chest wall deformity  Skin: no rash or lesions.  GU: no dysuria, change in color of urine, no urgency or frequency.  No flank pain, no hematuria   MS:  No joint pain or swelling.  No decreased range of motion.  No back pain.    Physical Exam  BP 130/70 (BP Location: Left Arm)   Pulse 86   Ht 5' 8" (1.727 m)   Wt 231 lb (104.8 kg)   LMP 12/31/1968   SpO2 95%   BMI 35.12 kg/m    GEN: A/Ox3; pleasant , NAD, elderly    HEENT:  [place]/AT,  EACs-clear, TMs-wnl, NOSE-clear, THROAT-clear, no lesions, no postnasal drip or exudate noted.   NECK:  Supple w/ fair ROM; no JVD; normal carotid impulses w/o bruits; no thyromegaly or nodules palpated; no lymphadenopathy.    RESP  Clear  P & A; w/o, wheezes/ rales/ or rhonchi. no accessory muscle use, no dullness to percussion  CARD:  RRR, no m/r/g, tr peripheral edema, pulses intact, no cyanosis or clubbing.  GI:   Soft & nt; nml bowel sounds; no organomegaly or masses detected.   Musco: Warm bil, no deformities or joint swelling noted.   Neuro: alert, no focal deficits noted.  Skin: Warm, no lesions or rashes    Lab Results:  CBC     Imaging: No results found.   Assessment & Plan:   Cough variant asthma Flare  Steroid taper  Control for triggers   Plan  . Patient Instructions  Prednisone taper over next week Continue on Claritin daily  Add Flonase 2 puffs At bedtime  .  Refer to cardiology  Please contact office for sooner follow up if symptoms do not improve or worsen or seek emergency care  Follow up Dr. Elsworth Soho  In 4-6 weeks and As needed        Allergic rhinitis Flare  Add flonase   Plan  Patient Instructions  Prednisone taper over next week Continue on Claritin daily  Add Flonase 2 puffs At bedtime  .  Refer to cardiology  Please contact office for sooner follow up if symptoms do not improve or worsen or seek emergency care  Follow up Dr. Elsworth Soho  In 4-6 weeks and As needed       Palpitations Persistent palpitations  Limit caffeine use  EKG unremarkable  Refer to to cardiology .      Rexene Edison, NP 01/20/2017

## 2017-01-20 NOTE — Assessment & Plan Note (Signed)
Flare  Steroid taper  Control for triggers   Plan  . Patient Instructions  Prednisone taper over next week Continue on Claritin daily  Add Flonase 2 puffs At bedtime  .  Refer to cardiology  Please contact office for sooner follow up if symptoms do not improve or worsen or seek emergency care  Follow up Dr. Elsworth Soho  In 4-6 weeks and As needed

## 2017-01-20 NOTE — Assessment & Plan Note (Signed)
Flare  Add flonase   Plan  Patient Instructions  Prednisone taper over next week Continue on Claritin daily  Add Flonase 2 puffs At bedtime  .  Refer to cardiology  Please contact office for sooner follow up if symptoms do not improve or worsen or seek emergency care  Follow up Dr. Elsworth Soho  In 4-6 weeks and As needed

## 2017-01-20 NOTE — Telephone Encounter (Signed)
Left message for pt to call back.  Pt was seen today by Rexene Edison, NP-C @[facility] & she requested pt be seen here for palpitations.  Per Delana Meyer pt can be seen either 01/25/17 @11 :20 or 11/16 @10 :20 with Dr. Haroldine Laws.

## 2017-01-20 NOTE — Addendum Note (Signed)
Addended by: Clayborne Dana C on: 01/20/2017 01:04 PM   Modules accepted: Orders

## 2017-01-20 NOTE — Assessment & Plan Note (Signed)
Persistent palpitations  Limit caffeine use  EKG unremarkable  Refer to to cardiology .

## 2017-01-20 NOTE — Patient Instructions (Addendum)
Advair 1 puff Twice daily  Until sample is gone .  Prednisone taper over next week Continue on Claritin daily  Add Flonase 2 puffs At bedtime  . Ventolin As needed  Wheezing .   Refer to cardiology  Please contact office for sooner follow up if symptoms do not improve or worsen or seek emergency care  Follow up Dr. Elsworth Soho  In 4-6 weeks and As needed

## 2017-01-21 ENCOUNTER — Other Ambulatory Visit: Payer: Self-pay | Admitting: *Deleted

## 2017-01-21 MED ORDER — LORATADINE 10 MG PO TABS: 10.0000 mg | ORAL_TABLET | Freq: Every day | ORAL | 2 refills | Status: DC

## 2017-01-21 NOTE — Progress Notes (Signed)
Reviewed & agree with plan  

## 2017-01-21 NOTE — Telephone Encounter (Signed)
Spoke with patient this morning.  She is scheduled on 01/25/17 @10 :20 with Dr. Haroldine Laws.

## 2017-01-25 ENCOUNTER — Ambulatory Visit: Payer: Medicare HMO | Admitting: Physician Assistant

## 2017-01-25 ENCOUNTER — Ambulatory Visit (HOSPITAL_COMMUNITY)
Admission: RE | Admit: 2017-01-25 | Discharge: 2017-01-25 | Disposition: A | Discharge status: Home / Self Care | Payer: Medicare HMO | Source: Ambulatory Visit | Attending: Internal Medicine | Admitting: Internal Medicine

## 2017-01-25 VITALS — BP 138/72 | HR 97 | Wt 232.6 lb

## 2017-01-25 DIAGNOSIS — E669 Obesity, unspecified: Secondary | ICD-10-CM | POA: Diagnosis not present

## 2017-01-25 DIAGNOSIS — R002 Palpitations: Secondary | ICD-10-CM | POA: Diagnosis not present

## 2017-01-25 DIAGNOSIS — Z79899 Other long term (current) drug therapy: Secondary | ICD-10-CM | POA: Insufficient documentation

## 2017-01-25 DIAGNOSIS — Z9013 Acquired absence of bilateral breasts and nipples: Secondary | ICD-10-CM | POA: Insufficient documentation

## 2017-01-25 DIAGNOSIS — K219 Gastro-esophageal reflux disease without esophagitis: Secondary | ICD-10-CM | POA: Diagnosis not present

## 2017-01-25 DIAGNOSIS — Z7982 Long term (current) use of aspirin: Secondary | ICD-10-CM | POA: Insufficient documentation

## 2017-01-25 DIAGNOSIS — F329 Major depressive disorder, single episode, unspecified: Secondary | ICD-10-CM | POA: Diagnosis not present

## 2017-01-25 DIAGNOSIS — Z9221 Personal history of antineoplastic chemotherapy: Secondary | ICD-10-CM | POA: Diagnosis not present

## 2017-01-25 DIAGNOSIS — E785 Hyperlipidemia, unspecified: Secondary | ICD-10-CM | POA: Diagnosis not present

## 2017-01-25 DIAGNOSIS — Z8673 Personal history of transient ischemic attack (TIA), and cerebral infarction without residual deficits: Secondary | ICD-10-CM | POA: Diagnosis not present

## 2017-01-25 DIAGNOSIS — Z87442 Personal history of urinary calculi: Secondary | ICD-10-CM | POA: Insufficient documentation

## 2017-01-25 DIAGNOSIS — I10 Essential (primary) hypertension: Secondary | ICD-10-CM | POA: Insufficient documentation

## 2017-01-25 DIAGNOSIS — Z86010 Personal history of colonic polyps: Secondary | ICD-10-CM | POA: Diagnosis not present

## 2017-01-25 DIAGNOSIS — Z853 Personal history of malignant neoplasm of breast: Secondary | ICD-10-CM | POA: Diagnosis not present

## 2017-01-25 DIAGNOSIS — R0600 Dyspnea, unspecified: Secondary | ICD-10-CM | POA: Diagnosis not present

## 2017-01-25 DIAGNOSIS — Z7984 Long term (current) use of oral hypoglycemic drugs: Secondary | ICD-10-CM | POA: Diagnosis not present

## 2017-01-25 DIAGNOSIS — G4733 Obstructive sleep apnea (adult) (pediatric): Secondary | ICD-10-CM | POA: Diagnosis not present

## 2017-01-25 DIAGNOSIS — J45909 Unspecified asthma, uncomplicated: Secondary | ICD-10-CM | POA: Insufficient documentation

## 2017-01-25 NOTE — Patient Instructions (Signed)
Your provider requests you have a 48 hour holter monitor.   Your provider requests you have an echo.  Follow up in 6 months with Dr.Bensimhon.

## 2017-01-25 NOTE — Progress Notes (Signed)
ADV[facility]ED [facility] CLINIC NOTE  Patient ID: [name], female   DOB: 08/28/1950, 66 y.o.   MRN: [id number]    HPI: [name] is a 66 y/o women with h/o obesity, HTN, asthma and HL. She also has history of TIA in April 2012.    Found to have L ductal breast CA. ER/PR +. HER 2 neu was equivocal. s/p bilateral mastectomies in 01/2010. Lymph nodes negative.   She completed herceptin therapy Jan. 2013.  PSG 06/2012 showed mild OSA - she felt very claustrophobic,she could not afford the CPAP - hence abandoned  Echo April 10, 2010 EF 55-60%. Grade 1 diastolic dusfunction. MUGA 2/12 EF 73%  Echo April 9,2012 EF 60-65% lat s' 10.43 (? Artifact) Echo 12/13/10 60-65% poor windows for lat s' peak I see is 8.09 ECHO 02/24/12 EF 55-60% lateral s' 6.5 ECHO 07/02/11 EF 55-60% lateral S' 6.9 RV mildly dilated but normal function Echo 05/30/2012: EF 60-65% lat s' 7.1, Grade 1 diastolic dysfunction September 2012 we changed HCTZ to lasix for longstanding LE edema (since her daughter was born in 35).  Myoview 11/14 LV Ejection Fraction: 68%. LV Wall Motion: NL LV Function; NL Wall Motion  We have not seen her since 2016 and is referred back by Tammy Parrett NP-C for further evaluation of palpitations and dyspnea. She said the problems began several weeks ago when she had some furniture stained. Said she couldn't breathe and couldn't stay in bedroom. No wheezing. No CP. Had tachypalpitations for several days. Episodes would last for a few minutes and then go away. No dizziness or presyncope. Now that furniture has been moved out feels much better. No further SOB. Palpitations have resolved. Snores mildly Chronic ankle edema unchanged.   ROS: All systems negative except as listed in HPI, PMH and Problem List.  Past Medical History:  Diagnosis Date  . Arthritis    DJD, lumbar spondylosis  . Asthma   . Back pain   . Bilateral renal cysts   . Breast cancer ([place])    left breast invasive ductal ca in remission  as of 10/07/11 s/p double mastectomy  . Bronchitis 03/10/2012  . Cataract   . Chemotherapy follow-up examination 03/06/2011  . Colon polyps   . Cough 03/06/2011  . Depression    seasonal melancholia  . E. coli UTI   . Edema extremities   . GERD (gastroesophageal reflux disease)   . Hyperlipidemia   . Hypertension   . Ileitis   . Kidney stones    non obstructive   . Myopathy    not statin related, cause unknown    Current Outpatient Medications on File Prior to Encounter  Medication Sig Dispense Refill  . ACCU-CHEK FASTCLIX LANCETS MISC Check blood sugar daily 102 each 3  . albuterol (VENTOLIN HFA) 108 (90 Base) MCG/ACT inhaler INHALE 1 TO 2 PUFFS INTO THE LUNGS EVERY 6 (SIX) HOURS AS NEEDED FOR WHEEZING OR SHORTNESS OF BREATH. 18 g 4  . aspirin 81 MG tablet Take 81 mg by mouth daily.    . Blood Glucose Calibration (PRODIGY CONTROL SOLUTION) Low SOLN 1 each by In Vitro route every morning. 1 each 0  . Blood Glucose Monitoring Suppl (ACCU-CHEK AVIVA PLUS) w/Device KIT Check blood sugar daily as intructed 1 kit 0  . carvedilol (COREG) 25 MG tablet TAKE 1/2 TABLET TWICE DAILY WITH MEALS 90 tablet 3  . cholecalciferol (VITAMIN D) 1000 UNITS tablet Take 1,000 Units by mouth daily.    Marland Kitchen glucose blood (  ACCU-CHEK AVIVA PLUS) test strip Check blood sugar one time a day as instructed 100 each 3  . hydrALAZINE (APRESOLINE) 25 MG tablet Take 1 tablet (25 mg total) by mouth 2 (two) times daily. 360 tablet 3  . loratadine (CLARITIN) 10 MG tablet Take 1 tablet (10 mg total) by mouth daily. 30 tablet 2  . LORazepam (ATIVAN) 1 MG tablet Take 1 tablet (1 mg total) by mouth as needed. This is a 30 day supply 30 tablet 5  . metFORMIN (GLUCOPHAGE) 500 MG tablet TAKE 1 TABLET EVERY DAY WITH BREAKFAST 90 tablet 3  . Multiple Vitamin (MULTIVITAMIN) tablet Take 1 tablet by mouth daily.     Marland Kitchen olmesartan (BENICAR) 20 MG tablet TAKE 1 TABLET (20 MG TOTAL) BY MOUTH DAILY. 90 tablet 3  . pantoprazole (PROTONIX)  40 MG tablet Take 1 tablet (40 mg total) by mouth daily. 90 tablet 3  . predniSONE (DELTASONE) 10 MG tablet 4 tabs for 2 days, then 3 tabs for 2 days, 2 tabs for 2 days, then 1 tab for 2 days, then stop 20 tablet 0  . simvastatin (ZOCOR) 20 MG tablet TAKE 1 TABLET AT BEDTIME 90 tablet 3  . spironolactone (ALDACTONE) 25 MG tablet TAKE 1 TABLET DAILY. 90 tablet 3  . traMADol (ULTRAM) 50 MG tablet TAKE 1 TABLET EVERY DAY AS NEEDED 90 tablet 0  . triamcinolone (KENALOG) 0.1 % paste Use as directed 1 application in the mouth or throat 2 (two) times daily. 5 g 12  . fluticasone (FLONASE) 50 MCG/ACT nasal spray Place 2 sprays into both nostrils daily. 16 g 2   No current facility-administered medications on file prior to encounter.     Vitals:   01/25/17 1145  BP: 138/72  Pulse: 97  SpO2: 96%  Weight: 232 lb 9.6 oz (105.5 kg)   PHYSICAL EXAM: General:  Well appearing. No resp difficulty HEENT: normal Neck: supple. JVP 5. Carotids 2+ bilat; no bruits. No lymphadenopathy or thryomegaly appreciated. Cor: PMI nondisplaced. Regular rate & rhythm. No rubs, gallops or murmurs. Lungs: clear Abdomen: obese soft, nontender, nondistended. No hepatosplenomegaly. No bruits or masses. Good bowel sounds. Extremities: no cyanosis, clubbing, rash, trace ankle edema Neuro: alert & orientedx3, cranial nerves grossly intact. moves all 4 extremities w/o difficulty. Affect pleasant  NSR 76 No significant ST-T abnormalities Personally reviewed   ASSESSMENT & PLAN:  1) Dyspnea and palpitations - I agree that this was likely related to environmental exposure and now resolved. However she is at risk for PAF so will put 48 hour monitor on her to assess for any abnormal rhythms particularly at night. We will also check echo to make sure heart is structurally unchanged. Need to watch her snoring closely and if getting worse may need another sleep study. 2) CP - Previous Lexi Scan Myoview 11/14 was negative. CP  resolved.  3) Chronic diastolic H  - volume status stable.  3) Breast cancer  - finished chemo now in remission.  4) HTN  - Blood pressure well controlled. Continue current regimen.   Aelyn Stanaland,MD 12:20 PM

## 2017-01-25 NOTE — Progress Notes (Signed)
Entered in error

## 2017-01-28 ENCOUNTER — Other Ambulatory Visit (HOSPITAL_COMMUNITY): Payer: Self-pay | Admitting: Internal Medicine

## 2017-01-28 ENCOUNTER — Ambulatory Visit (INDEPENDENT_AMBULATORY_CARE_PROVIDER_SITE_OTHER): Payer: Medicare HMO

## 2017-01-28 DIAGNOSIS — R0602 Shortness of breath: Secondary | ICD-10-CM | POA: Diagnosis not present

## 2017-01-28 DIAGNOSIS — R0600 Dyspnea, unspecified: Secondary | ICD-10-CM

## 2017-01-28 DIAGNOSIS — R002 Palpitations: Secondary | ICD-10-CM | POA: Diagnosis not present

## 2017-01-29 ENCOUNTER — Other Ambulatory Visit: Payer: Self-pay | Admitting: *Deleted

## 2017-02-01 MED ORDER — LORATADINE 10 MG PO TABS: 10.0000 mg | ORAL_TABLET | Freq: Every day | ORAL | 3 refills | Status: DC

## 2017-02-16 ENCOUNTER — Ambulatory Visit (HOSPITAL_COMMUNITY)
Admission: RE | Admit: 2017-02-16 | Discharge: 2017-02-16 | Disposition: A | Discharge status: Home / Self Care | Payer: Medicare HMO | Source: Ambulatory Visit | Attending: Internal Medicine | Admitting: Internal Medicine

## 2017-02-16 DIAGNOSIS — N189 Chronic kidney disease, unspecified: Secondary | ICD-10-CM | POA: Diagnosis not present

## 2017-02-16 DIAGNOSIS — I129 Hypertensive chronic kidney disease with stage 1 through stage 4 chronic kidney disease, or unspecified chronic kidney disease: Secondary | ICD-10-CM | POA: Insufficient documentation

## 2017-02-16 DIAGNOSIS — E1122 Type 2 diabetes mellitus with diabetic chronic kidney disease: Secondary | ICD-10-CM | POA: Insufficient documentation

## 2017-02-16 DIAGNOSIS — H00024 Hordeolum internum left upper eyelid: Secondary | ICD-10-CM | POA: Diagnosis not present

## 2017-02-16 DIAGNOSIS — E785 Hyperlipidemia, unspecified: Secondary | ICD-10-CM | POA: Diagnosis not present

## 2017-02-16 DIAGNOSIS — R0600 Dyspnea, unspecified: Secondary | ICD-10-CM

## 2017-02-16 DIAGNOSIS — Z853 Personal history of malignant neoplasm of breast: Secondary | ICD-10-CM | POA: Diagnosis not present

## 2017-02-16 NOTE — Progress Notes (Signed)
  Echocardiogram 2D Echocardiogram has been performed.  [name] L Androw 02/16/2017, 12:07 PM

## 2017-02-17 ENCOUNTER — Ambulatory Visit: Payer: Medicare HMO | Admitting: Pulmonary Disease

## 2017-03-05 ENCOUNTER — Other Ambulatory Visit: Payer: Self-pay | Admitting: *Deleted

## 2017-03-05 MED ORDER — FLUCONAZOLE 150 MG PO TABS: 150.0000 mg | ORAL_TABLET | Freq: Every day | ORAL | 0 refills | Status: DC

## 2017-03-05 NOTE — Telephone Encounter (Signed)
Pt calls and states she has been on amoxicillin until 12/4 for an eye infection by dr Gershon Crane, 1 tab 3x daily for 10 days, she now has vaginal yeast  And would like some diflucan to help, states she is itching horribly.

## 2017-03-08 ENCOUNTER — Telehealth: Payer: Self-pay | Admitting: Internal Medicine

## 2017-03-08 NOTE — Telephone Encounter (Signed)
Thank you for letting me know, Im glad shes feeling better!

## 2017-03-08 NOTE — Telephone Encounter (Signed)
Pt just calls to say thank you to dr blum and triage for "knowing exactly what to do, I feel like a new woman!" she also wishes dr blum a merry Christmas.

## 2017-03-08 NOTE — Telephone Encounter (Signed)
Patient would like Bonnita Nasuti to callback pls

## 2017-03-10 ENCOUNTER — Ambulatory Visit: Payer: Medicare HMO | Admitting: Pulmonary Disease

## 2017-03-30 ENCOUNTER — Ambulatory Visit (INDEPENDENT_AMBULATORY_CARE_PROVIDER_SITE_OTHER): Payer: Medicare HMO | Admitting: Internal Medicine

## 2017-03-30 ENCOUNTER — Other Ambulatory Visit: Payer: Self-pay

## 2017-03-30 ENCOUNTER — Encounter: Payer: Self-pay | Admitting: Internal Medicine

## 2017-03-30 VITALS — BP 151/84 | HR 71 | Temp 98.4°F | Ht 68.0 in | Wt 237.2 lb

## 2017-03-30 DIAGNOSIS — Z9221 Personal history of antineoplastic chemotherapy: Secondary | ICD-10-CM

## 2017-03-30 DIAGNOSIS — G4733 Obstructive sleep apnea (adult) (pediatric): Secondary | ICD-10-CM | POA: Diagnosis not present

## 2017-03-30 DIAGNOSIS — Z6835 Body mass index (BMI) 35.0-35.9, adult: Secondary | ICD-10-CM | POA: Diagnosis not present

## 2017-03-30 DIAGNOSIS — Z853 Personal history of malignant neoplasm of breast: Secondary | ICD-10-CM

## 2017-03-30 DIAGNOSIS — J302 Other seasonal allergic rhinitis: Secondary | ICD-10-CM

## 2017-03-30 DIAGNOSIS — Z79899 Other long term (current) drug therapy: Secondary | ICD-10-CM

## 2017-03-30 DIAGNOSIS — I5030 Unspecified diastolic (congestive) heart failure: Secondary | ICD-10-CM

## 2017-03-30 DIAGNOSIS — D631 Anemia in chronic kidney disease: Secondary | ICD-10-CM | POA: Diagnosis not present

## 2017-03-30 DIAGNOSIS — M1990 Unspecified osteoarthritis, unspecified site: Secondary | ICD-10-CM | POA: Diagnosis not present

## 2017-03-30 DIAGNOSIS — D126 Benign neoplasm of colon, unspecified: Secondary | ICD-10-CM

## 2017-03-30 DIAGNOSIS — I10 Essential (primary) hypertension: Secondary | ICD-10-CM

## 2017-03-30 DIAGNOSIS — D638 Anemia in other chronic diseases classified elsewhere: Secondary | ICD-10-CM | POA: Diagnosis not present

## 2017-03-30 DIAGNOSIS — N183 Chronic kidney disease, stage 3 (moderate): Secondary | ICD-10-CM

## 2017-03-30 DIAGNOSIS — I130 Hypertensive heart and chronic kidney disease with heart failure and stage 1 through stage 4 chronic kidney disease, or unspecified chronic kidney disease: Secondary | ICD-10-CM

## 2017-03-30 DIAGNOSIS — E1122 Type 2 diabetes mellitus with diabetic chronic kidney disease: Secondary | ICD-10-CM

## 2017-03-30 DIAGNOSIS — Z9013 Acquired absence of bilateral breasts and nipples: Secondary | ICD-10-CM | POA: Diagnosis not present

## 2017-03-30 DIAGNOSIS — E119 Type 2 diabetes mellitus without complications: Secondary | ICD-10-CM

## 2017-03-30 LAB — POCT GLYCOSYLATED HEMOGLOBIN (HGB A1C): Hemoglobin A1C: 7

## 2017-03-30 LAB — GLUCOSE, CAPILLARY: Glucose-Capillary: 101 mg/dL — ABNORMAL HIGH (ref 65–99)

## 2017-03-30 MED ORDER — FLUTICASONE PROPIONATE 50 MCG/ACT NA SUSP: 2.0000 | Freq: Every day | NASAL | 2 refills | Status: DC

## 2017-03-30 NOTE — Patient Instructions (Signed)
It was a pleasure to see you today [name]  - I have not made any changes to your medications today  - keep taking the allergy medications and follow up with the eye doctor  - Please call our clinic if you have any problems or questions, we may be able to help you and keep you from a long emergency room wait. Our clinic and after hours phone number is [phone]       Mediterranean Diet  Why follow it? Research shows. . Those who follow the Mediterranean diet have a reduced risk of heart disease  . The diet is associated with a reduced incidence of Parkinson's and Alzheimer's diseases . People following the diet may have longer life expectancies and lower rates of chronic diseases  . The Dietary Guidelines for Americans recommends the Mediterranean diet as an eating plan to promote health and prevent disease  What Is the Mediterranean Diet?  . Healthy eating plan based on typical foods and recipes of Mediterranean-style cooking . The diet is primarily a plant based diet; these foods should make up a majority of meals   Starches - Plant based foods should make up a majority of meals - They are an important sources of vitamins, minerals, energy, antioxidants, and fiber - Choose whole grains, foods high in fiber and minimally processed items  - Typical grain sources include wheat, oats, barley, corn, brown rice, bulgar, farro, millet, polenta, couscous  - Various types of beans include chickpeas, lentils, fava beans, black beans, white beans   Fruits  Veggies - Large quantities of antioxidant rich fruits & veggies; 6 or more servings  - Vegetables can be eaten raw or lightly drizzled with oil and cooked  - Vegetables common to the traditional Mediterranean Diet include: artichokes, arugula, beets, broccoli, brussel sprouts, cabbage, carrots, celery, collard greens, cucumbers, eggplant, kale, leeks, lemons, lettuce, mushrooms, okra, onions, peas, peppers, potatoes, pumpkin, radishes,  rutabaga, shallots, spinach, sweet potatoes, turnips, zucchini - Fruits common to the Mediterranean Diet include: apples, apricots, avocados, cherries, clementines, dates, figs, grapefruits, grapes, melons, nectarines, oranges, peaches, pears, pomegranates, strawberries, tangerines  Fats - Replace butter and margarine with healthy oils, such as olive oil, canola oil, and tahini  - Limit nuts to no more than a handful a day  - Nuts include walnuts, almonds, pecans, pistachios, pine nuts  - Limit or avoid candied, honey roasted or heavily salted nuts - Olives are central to the [employer] - can be eaten whole or used in a variety of dishes   Meats Protein - Limiting red meat: no more than a few times a month - When eating red meat: choose lean cuts and keep the portion to the size of deck of cards - Eggs: approx. 0 to 4 times a week  - Fish and lean poultry: at least 2 a week  - Healthy protein sources include, chicken, [place], lean beef, lamb - Increase intake of seafood such as tuna, salmon, trout, mackerel, shrimp, scallops - Avoid or limit high fat processed meats such as sausage and bacon  Dairy - Include moderate amounts of low fat dairy products  - Focus on healthy dairy such as fat free yogurt, skim milk, low or reduced fat cheese - Limit dairy products higher in fat such as whole or 2% milk, cheese, ice cream  Alcohol - Moderate amounts of red wine is ok  - No more than 5 oz daily for women (all ages) and men older than age 80  -  No more than 10 oz of wine daily for men younger than 30  Other - Limit sweets and other desserts  - Use herbs and spices instead of salt to flavor foods  - Herbs and spices common to the traditional Mediterranean Diet include: basil, bay leaves, chives, cloves, cumin, fennel, garlic, lavender, marjoram, mint, oregano, parsley, pepper, rosemary, sage, savory, sumac, tarragon, thyme   It's not just a diet, it's a lifestyle:  . The Mediterranean diet  includes lifestyle factors typical of those in the region  . Foods, drinks and meals are best eaten with others and savored . Daily physical activity is important for overall good health . This could be strenuous exercise like running and aerobics . This could also be more leisurely activities such as walking, housework, yard-work, or taking the stairs . Moderation is the key; a balanced and healthy diet accommodates most foods and drinks . Consider portion sizes and frequency of consumption of certain foods   Meal Ideas & Options:  . Breakfast:  o Whole wheat toast or whole wheat English muffins with peanut butter & hard boiled egg o Steel cut oats topped with apples & cinnamon and skim milk  o Fresh fruit: banana, strawberries, melon, berries, peaches  o Smoothies: strawberries, bananas, greek yogurt, peanut butter o Low fat greek yogurt with blueberries and granola  o Egg white omelet with spinach and mushrooms o Breakfast couscous: whole wheat couscous, apricots, skim milk, cranberries  . Sandwiches:  o Hummus and grilled vegetables (peppers, zucchini, squash) on whole wheat bread   o Grilled chicken on whole wheat pita with lettuce, tomatoes, cucumbers or tzatziki  o Tuna salad on whole wheat bread: tuna salad made with greek yogurt, olives, red peppers, capers, green onions o Garlic rosemary lamb pita: lamb sauted with garlic, rosemary, salt & pepper; add lettuce, cucumber, greek yogurt to pita - flavor with lemon juice and black pepper  . Seafood:  o Mediterranean grilled salmon, seasoned with garlic, basil, parsley, lemon juice and black pepper o Shrimp, lemon, and spinach whole-grain pasta salad made with low fat greek yogurt  o Seared scallops with lemon orzo  o Seared tuna steaks seasoned salt, pepper, coriander topped with tomato mixture of olives, tomatoes, olive oil, minced garlic, parsley, green onions and cappers  . Meats:  o Herbed greek chicken salad with kalamata  olives, cucumber, feta  o Red bell peppers stuffed with spinach, bulgur, lean ground beef (or lentils) & topped with feta   o Kebabs: skewers of chicken, tomatoes, onions, zucchini, squash  o [place] burgers: made with red onions, mint, dill, lemon juice, feta cheese topped with roasted red peppers . Vegetarian o Cucumber salad: cucumbers, artichoke hearts, celery, red onion, feta cheese, tossed in olive oil & lemon juice  o Hummus and whole grain pita points with a greek salad (lettuce, tomato, feta, olives, cucumbers, red onion) o Lentil soup with celery, carrots made with vegetable broth, garlic, salt and pepper  o Tabouli salad: parsley, bulgur, mint, scallions, cucumbers, tomato, radishes, lemon juice, olive oil, salt and pepper.

## 2017-03-30 NOTE — Progress Notes (Signed)
CC: follow up of allergies   HPI:  [name] is a 67 y.o. with PMH HTN, OSA, T2DM, OA, anemia of chronic disease, diastolic heart failure, CKD 3, breast cancer (s/p bl mastectomies followed by chemotherapy with TCH and letroxole)  who presents for follow up of OSA, iron deficiency anemia, morbid obesity, hypertension, and seasonal allergies. Please see the assessment and plans for the status of the patient chronic medical problems.    Past Medical History:  Diagnosis Date  . Arthritis    DJD, lumbar spondylosis  . Asthma   . Back pain   . Bilateral renal cysts   . Breast cancer ([place])    left breast invasive ductal ca in remission as of 10/07/11 s/p double mastectomy  . Bronchitis 03/10/2012  . Cataract   . Chemotherapy follow-up examination 03/06/2011  . Colon polyps   . Cough 03/06/2011  . Depression    seasonal melancholia  . E. coli UTI   . Edema extremities   . GERD (gastroesophageal reflux disease)   . Hyperlipidemia   . Hypertension   . Ileitis   . Kidney stones    non obstructive   . Myopathy    not statin related, cause unknown    Review of Systems: Refer to history of present illness and assessment and plans for pertinent review of systems, all others reviewed and negative  Physical Exam:  Vitals:   03/30/17 1359 03/30/17 1514  BP: (!) 152/81 (!) 151/84  Pulse: 89 71  Temp: 98.4 F (36.9 C)   TempSrc: Oral   SpO2: 100%   Weight: 237 lb 3.2 oz (107.6 kg)   Height: 5\' 8"  (1.727 m)    General: well appearing, no acute distress  HEENT: moist mucous membranes  Cardiac: RRR, no murmur appreciated, no peripheral edema  Pulm: LCTAB   Assessment & Plan:   Anemia of chronic disease  Colonoscopy in august with 9 sessile polyps, path with tubular adenoma without high grade dysplasia or malignancy. Denies bleeding or symptoms of anemia. Not currently on iron supplementation. Last iron studies in 2012 showed iron 28 with Tsat 12 and TIBC230, in 2017  ferritin was 1,000 and retic count 41. These studies are consistent with anemia of chronic disease but I wonder if there is also some iron deficiency. It is also concerning that the ferritin was so elevated given her history of breast cancer and normal hepatic function, will continue watchful monitoring.  - repeat CBC and iron studies performed today-  TIBC remains low/normal, with tsat 27 consistent with anemia of chronic disease, no need for iron supplementation   Morbid obesity  BMI 35 with associated morbidities of hypertension, OSA, Diabetes, and osteoarthritis. Discussed mediterranean diet and exercise. She is highly motivated to start walking and says that her goal is to lose weight by the point of her next follow up appointment.  - encouragement of diet and exercise strategies    OSA   Follows with Dr. Elsworth Soho, was unable to tolerate CPAP machine, referred to Dr. Ron Parker for mask fitting. Mask fit but was too loud and kept her awake at night. [name] says that she discussed this with Dr. Elsworth Soho and has decided to come off of the mask. Denies headaches or daytime fatigue, does still wake up 1-2 times per night to use the restroom.  - encouraged weight loss   Hypertension  Blood pressure not controlled and at goal but is usually close to goal (<120/80) at office visits. Reports  good medication compliance, this is impressive as she is on four antihypertensives. Will continue current meds and recheck BP at next visit.  - Continue hydralizine 25 mg BID, olmesartan, carvedilol 25 mg qd, and spironolactone 25 mg qd  - encouraged low salt intake, exercise, and weight loss   See Encounters Tab for problem based charting.  Patient discussed with Dr. Eppie Gibson

## 2017-03-31 ENCOUNTER — Encounter: Payer: Self-pay | Admitting: Internal Medicine

## 2017-03-31 LAB — BMP8+ANION GAP
Anion Gap: 15 mmol/L (ref 10.0–18.0)
BUN/Creatinine Ratio: 12 (ref 12–28)
BUN: 17 mg/dL (ref 8–27)
CO2: 24 mmol/L (ref 20–29)
Calcium: 9.9 mg/dL (ref 8.7–10.3)
Chloride: 106 mmol/L (ref 96–106)
Creatinine, Ser: 1.38 mg/dL — ABNORMAL HIGH (ref 0.57–1.00)
GFR calc Af Amer: 45 mL/min/{1.73_m2} — ABNORMAL LOW (ref >59)
GFR calc non Af Amer: 39 mL/min/{1.73_m2} — ABNORMAL LOW (ref >59)
Glucose: 96 mg/dL (ref 65–99)
Potassium: 4.5 mmol/L (ref 3.5–5.2)
Sodium: 145 mmol/L — ABNORMAL HIGH (ref 134–144)

## 2017-03-31 LAB — CBC
Hematocrit: 33.3 % — ABNORMAL LOW (ref 34.0–46.6)
Hemoglobin: 10.9 g/dL — ABNORMAL LOW (ref 11.1–15.9)
MCH: 28.8 pg (ref 26.6–33.0)
MCHC: 32.7 g/dL (ref 31.5–35.7)
MCV: 88 fL (ref 79–97)
Platelets: 231 10*3/uL (ref 150–379)
RBC: 3.79 x10E6/uL (ref 3.77–5.28)
RDW: 13.9 % (ref 12.3–15.4)
WBC: 6.3 10*3/uL (ref 3.4–10.8)

## 2017-03-31 LAB — IRON AND TIBC
Iron Saturation: 27 % (ref 15–55)
Iron: 70 ug/dL (ref 27–139)
Total Iron Binding Capacity: 262 ug/dL (ref 250–450)
UIBC: 192 ug/dL (ref 118–369)

## 2017-03-31 NOTE — Assessment & Plan Note (Signed)
BMI 35 with associated morbidities of hypertension, OSA, Diabetes, and osteoarthritis. Discussed mediterranean diet and exercise. She is highly motivated to start walking and says that her goal is to lose weight by the point of her next follow up appointment.  - encouragement of diet and exercise strategies

## 2017-03-31 NOTE — Assessment & Plan Note (Signed)
Follows with Dr. Elsworth Soho, was unable to tolerate CPAP machine, referred to Dr. Ron Parker for mask fitting. Mask fit but was too loud and kept her awake at night. Ms. Landstrom says that she discussed this with Dr. Elsworth Soho and has decided to come off of the mask. Denies headaches or daytime fatigue, does still wake up 1-2 times per night to use the restroom.  - encouraged weight loss

## 2017-03-31 NOTE — Assessment & Plan Note (Signed)
Colonoscopy in august with 9 sessile polyps, path with tubular adenoma without high grade dysplasia or malignancy. Denies bleeding or symptoms of anemia. Not currently on iron supplementation. Last iron studies in 2012 showed iron 28 with Tsat 12 and TIBC230, in 2017 ferritin was 1,000 and retic count 41. These studies are consistent with anemia of chronic disease but I wonder if there is also some iron deficiency. It is also concerning that the ferritin was so elevated given her history of breast cancer and normal hepatic function, will continue watchful monitoring.  - repeat CBC and iron studies performed today-  TIBC remains low/normal, with tsat 27 consistent with anemia of chronic disease, no need for iron supplementation

## 2017-03-31 NOTE — Assessment & Plan Note (Signed)
Blood pressure not controlled and at goal but is usually close to goal (<120/80) at office visits. Reports good medication compliance, this is impressive as she is on four antihypertensives. Will continue current meds and recheck BP at next visit.  - Continue hydralizine 25 mg BID, olmesartan, carvedilol 25 mg qd, and spironolactone 25 mg qd  - encouraged low salt intake, exercise, and weight loss

## 2017-04-05 NOTE — Progress Notes (Signed)
Case discussed with Dr. Blum at the time of the visit. We reviewed the resident's history and exam and pertinent patient test results. I agree with the assessment, diagnosis, and plan of care documented in the resident's note. 

## 2017-04-06 ENCOUNTER — Ambulatory Visit: Payer: Medicare HMO | Admitting: Pulmonary Disease

## 2017-04-06 ENCOUNTER — Encounter: Payer: Self-pay | Admitting: Pulmonary Disease

## 2017-04-06 DIAGNOSIS — J45991 Cough variant asthma: Secondary | ICD-10-CM | POA: Diagnosis not present

## 2017-04-06 DIAGNOSIS — G4733 Obstructive sleep apnea (adult) (pediatric): Secondary | ICD-10-CM

## 2017-04-06 MED ORDER — ALBUTEROL SULFATE HFA 108 (90 BASE) MCG/ACT IN AERS: INHALATION_SPRAY | RESPIRATORY_TRACT | 4 refills | Status: DC

## 2017-04-06 NOTE — Assessment & Plan Note (Signed)
Refills on albuterol.  She has been off steroids/LABA combination and has done well

## 2017-04-06 NOTE — Patient Instructions (Addendum)
Refills on albuterol. Call as needed

## 2017-04-06 NOTE — Assessment & Plan Note (Signed)
Did not tolerate CPAP, could not afford oral appliance. She will call us if symptoms worse. Weight loss encouraged

## 2017-04-06 NOTE — Addendum Note (Signed)
Addended by: Valerie Salts on: 04/06/2017 11:38 AM   Modules accepted: Orders

## 2017-04-06 NOTE — Progress Notes (Signed)
   Subjective:    Patient ID: [name], female    DOB: 1950-03-29, 67 y.o.   MRN: [id number]  HPI  67 yo , never smoker, for FU of OSA &recurrent cough &wheezing  Attributed to cough variant asthma , sinuses &GERD  She reported intermittent wheezing especially in spring & fall for 3 yrs , wheezing was noted in the clinic in 01/2011 and she was given a diagnosis of asthma. She takes advair only seasonally. Off daily advair since 1/ 2014.  She is s/p chemotherapy for Breast cancer . Grade 1 diastolic dysfunction noted  on echo.   Pedal edema has resolved and she takes Aldactone daily  She was unable to use CPAP and was referred for oral appliance-but could not afford daughter feels that Snoring has improved She was seen 12/2016 for palpitations, EKG normal. She feels that shortness of breath and wheezing during the visit was due to new furniture in the room  She uses albuterol on an as-needed basis and seasonally also uses Claritin   Significant tests/ events  Methacholine challenge 11/2007 drop in smaller airways >>added singulair  spirometry 11/2007 wnl &11/2010 -no obstruction.  Head CT 9/12  Ethmoid sinus mucosal thickening and bubbly opacity in the left sphenoid  PSG 06/2012 showed mild OSA - she felt very claustrophobic,she could not afford the CPAP - hence abandoned    Review of Systems Patient denies significant dyspnea,cough, hemoptysis,  chest pain, palpitations, pedal edema, orthopnea, paroxysmal nocturnal dyspnea, lightheadedness, nausea, vomiting, abdominal or  leg pains      Objective:   Physical Exam  Gen. Pleasant, obese, in no distress ENT - no lesions, no post nasal drip Neck: No JVD, no thyromegaly, no carotid bruits Lungs: no use of accessory muscles, no dullness to percussion, decreased without rales or rhonchi  Cardiovascular: Rhythm regular, heart sounds  normal, no murmurs or gallops, no peripheral edema Musculoskeletal: No  deformities, no cyanosis or clubbing , no tremors 18       Assessment & Plan:

## 2017-04-21 DIAGNOSIS — C50912 Malignant neoplasm of unspecified site of left female breast: Secondary | ICD-10-CM | POA: Diagnosis not present

## 2017-04-21 DIAGNOSIS — C50911 Malignant neoplasm of unspecified site of right female breast: Secondary | ICD-10-CM | POA: Diagnosis not present

## 2017-04-29 ENCOUNTER — Telehealth: Payer: Self-pay | Admitting: Internal Medicine

## 2017-04-29 ENCOUNTER — Other Ambulatory Visit: Payer: Self-pay | Admitting: *Deleted

## 2017-04-29 NOTE — Telephone Encounter (Signed)
Called pt, requested med

## 2017-04-29 NOTE — Telephone Encounter (Signed)
Patient is having allergic reaction to some soap, she it itching all over. Pls call patient

## 2017-04-30 MED ORDER — FLUCONAZOLE 150 MG PO TABS: 150.0000 mg | ORAL_TABLET | Freq: Every day | ORAL | 0 refills | Status: DC

## 2017-04-30 NOTE — Telephone Encounter (Signed)
[name] was having vaginal itchiness which she associates with yeast infection. She may need a follow up wet prep with next request ( last was in 2015, neg for candida)

## 2017-04-30 NOTE — Telephone Encounter (Signed)
Pt states she was using a different soap someone had given her (perfumed) and now having vaginal itching. Wants to know if u could prescribe something. Thanks

## 2017-04-30 NOTE — Telephone Encounter (Signed)
Pt called / informed rx for fluconazole - stated she has it already. And thank you.

## 2017-04-30 NOTE — Telephone Encounter (Signed)
I had sent in a prescription for fluconazole, this is what she request when these symptoms occur. She should have a wet prep next time.

## 2017-05-11 DIAGNOSIS — H524 Presbyopia: Secondary | ICD-10-CM | POA: Diagnosis not present

## 2017-05-11 DIAGNOSIS — H04123 Dry eye syndrome of bilateral lacrimal glands: Secondary | ICD-10-CM | POA: Insufficient documentation

## 2017-05-11 DIAGNOSIS — H2513 Age-related nuclear cataract, bilateral: Secondary | ICD-10-CM | POA: Diagnosis not present

## 2017-05-11 DIAGNOSIS — H5203 Hypermetropia, bilateral: Secondary | ICD-10-CM | POA: Diagnosis not present

## 2017-05-11 DIAGNOSIS — H43813 Vitreous degeneration, bilateral: Secondary | ICD-10-CM | POA: Diagnosis not present

## 2017-05-11 DIAGNOSIS — Z7984 Long term (current) use of oral hypoglycemic drugs: Secondary | ICD-10-CM | POA: Diagnosis not present

## 2017-05-11 DIAGNOSIS — E119 Type 2 diabetes mellitus without complications: Secondary | ICD-10-CM | POA: Diagnosis not present

## 2017-05-11 LAB — HM DIABETES EYE EXAM

## 2017-05-24 DIAGNOSIS — C50912 Malignant neoplasm of unspecified site of left female breast: Secondary | ICD-10-CM | POA: Diagnosis not present

## 2017-05-24 DIAGNOSIS — C50911 Malignant neoplasm of unspecified site of right female breast: Secondary | ICD-10-CM | POA: Diagnosis not present

## 2017-05-26 DIAGNOSIS — C50912 Malignant neoplasm of unspecified site of left female breast: Secondary | ICD-10-CM | POA: Diagnosis not present

## 2017-06-01 ENCOUNTER — Other Ambulatory Visit: Payer: Self-pay | Admitting: Internal Medicine

## 2017-06-01 ENCOUNTER — Other Ambulatory Visit: Payer: Self-pay | Admitting: Pulmonary Disease

## 2017-06-01 DIAGNOSIS — M1711 Unilateral primary osteoarthritis, right knee: Secondary | ICD-10-CM

## 2017-06-01 DIAGNOSIS — J302 Other seasonal allergic rhinitis: Secondary | ICD-10-CM

## 2017-06-01 DIAGNOSIS — R0789 Other chest pain: Secondary | ICD-10-CM

## 2017-06-03 ENCOUNTER — Telehealth: Payer: Self-pay | Admitting: *Deleted

## 2017-06-03 DIAGNOSIS — E785 Hyperlipidemia, unspecified: Secondary | ICD-10-CM

## 2017-06-03 DIAGNOSIS — I10 Essential (primary) hypertension: Secondary | ICD-10-CM

## 2017-06-03 MED ORDER — SPIRONOLACTONE 25 MG PO TABS: 25.0000 mg | ORAL_TABLET | Freq: Every day | ORAL | 3 refills | Status: DC

## 2017-06-03 MED ORDER — SIMVASTATIN 20 MG PO TABS: 20.0000 mg | ORAL_TABLET | Freq: Every day | ORAL | 3 refills | Status: DC

## 2017-06-03 NOTE — Telephone Encounter (Signed)
Next appt scheduled  6/25 with PCP.

## 2017-06-04 ENCOUNTER — Other Ambulatory Visit: Payer: Self-pay | Admitting: General Surgery

## 2017-06-04 ENCOUNTER — Ambulatory Visit
Admission: RE | Admit: 2017-06-04 | Discharge: 2017-06-04 | Disposition: A | Discharge status: Home / Self Care | Payer: Medicare HMO | Source: Ambulatory Visit | Attending: General Surgery | Admitting: General Surgery

## 2017-06-04 DIAGNOSIS — R0781 Pleurodynia: Secondary | ICD-10-CM | POA: Diagnosis not present

## 2017-06-04 DIAGNOSIS — C50912 Malignant neoplasm of unspecified site of left female breast: Secondary | ICD-10-CM

## 2017-06-07 NOTE — Telephone Encounter (Signed)
Sending per Dr Fredrik Cove instructions

## 2017-06-07 NOTE — Telephone Encounter (Signed)
Received faxed request from [facility] for refills on simvastatin and spironolactone. These meds were sent to [facility] [address] on 06/03/2017. Left message on patient's VM requesting return call to clarify which pharmacy she wanted these meds sent to. Hubbard Hartshorn, RN, BSN

## 2017-06-09 ENCOUNTER — Other Ambulatory Visit: Payer: Self-pay | Admitting: *Deleted

## 2017-06-09 DIAGNOSIS — M1711 Unilateral primary osteoarthritis, right knee: Secondary | ICD-10-CM

## 2017-06-09 DIAGNOSIS — J302 Other seasonal allergic rhinitis: Secondary | ICD-10-CM

## 2017-06-09 DIAGNOSIS — E785 Hyperlipidemia, unspecified: Secondary | ICD-10-CM

## 2017-06-09 DIAGNOSIS — I10 Essential (primary) hypertension: Secondary | ICD-10-CM

## 2017-06-11 ENCOUNTER — Telehealth: Payer: Self-pay | Admitting: Internal Medicine

## 2017-06-11 MED ORDER — FLUTICASONE PROPIONATE 50 MCG/ACT NA SUSP: 2.0000 | Freq: Every day | NASAL | 3 refills | Status: DC

## 2017-06-11 MED ORDER — LORATADINE 10 MG PO TABS: 10.0000 mg | ORAL_TABLET | Freq: Every day | ORAL | 3 refills | Status: DC

## 2017-06-11 MED ORDER — TRAMADOL HCL 50 MG PO TABS: ORAL_TABLET | ORAL | 0 refills | Status: DC

## 2017-06-11 MED ORDER — HYDRALAZINE HCL 25 MG PO TABS: 25.0000 mg | ORAL_TABLET | Freq: Two times a day (BID) | ORAL | 3 refills | Status: DC

## 2017-06-11 MED ORDER — SIMVASTATIN 20 MG PO TABS: 20.0000 mg | ORAL_TABLET | Freq: Every day | ORAL | 3 refills | Status: DC

## 2017-06-11 MED ORDER — SPIRONOLACTONE 25 MG PO TABS
25.0000 mg | ORAL_TABLET | Freq: Every day | ORAL | 3 refills | Status: DC
Start: 2017-06-11 — End: 2018-01-17

## 2017-06-11 NOTE — Telephone Encounter (Signed)
Called pt and informed her script at [facility]

## 2017-06-11 NOTE — Telephone Encounter (Signed)
Patient is requesting that Tramadol be sent to to [facility] [address]

## 2017-06-26 ENCOUNTER — Other Ambulatory Visit: Payer: Self-pay | Admitting: Internal Medicine

## 2017-06-26 DIAGNOSIS — J302 Other seasonal allergic rhinitis: Secondary | ICD-10-CM

## 2017-07-26 ENCOUNTER — Telehealth: Payer: Self-pay | Admitting: *Deleted

## 2017-08-03 ENCOUNTER — Inpatient Hospital Stay: Payer: Medicare HMO | Attending: Adult Health | Admitting: Adult Health

## 2017-08-03 ENCOUNTER — Telehealth: Payer: Self-pay | Admitting: Adult Health

## 2017-08-03 ENCOUNTER — Encounter: Payer: Self-pay | Admitting: Adult Health

## 2017-08-03 ENCOUNTER — Inpatient Hospital Stay: Payer: Medicare HMO

## 2017-08-03 VITALS — BP 151/87 | HR 77 | Temp 98.8°F | Resp 18 | Ht 68.0 in | Wt 238.7 lb

## 2017-08-03 DIAGNOSIS — Z853 Personal history of malignant neoplasm of breast: Secondary | ICD-10-CM | POA: Insufficient documentation

## 2017-08-03 DIAGNOSIS — Z7984 Long term (current) use of oral hypoglycemic drugs: Secondary | ICD-10-CM | POA: Diagnosis not present

## 2017-08-03 DIAGNOSIS — Z170 Estrogen receptor positive status [ER+]: Secondary | ICD-10-CM | POA: Diagnosis not present

## 2017-08-03 DIAGNOSIS — Z923 Personal history of irradiation: Secondary | ICD-10-CM | POA: Diagnosis not present

## 2017-08-03 DIAGNOSIS — Z9221 Personal history of antineoplastic chemotherapy: Secondary | ICD-10-CM | POA: Diagnosis not present

## 2017-08-03 DIAGNOSIS — Z9013 Acquired absence of bilateral breasts and nipples: Secondary | ICD-10-CM | POA: Diagnosis not present

## 2017-08-03 DIAGNOSIS — K219 Gastro-esophageal reflux disease without esophagitis: Secondary | ICD-10-CM | POA: Diagnosis not present

## 2017-08-03 DIAGNOSIS — Z9223 Personal history of estrogen therapy: Secondary | ICD-10-CM | POA: Diagnosis not present

## 2017-08-03 DIAGNOSIS — Z1501 Genetic susceptibility to malignant neoplasm of breast: Secondary | ICD-10-CM | POA: Diagnosis not present

## 2017-08-03 DIAGNOSIS — Z7982 Long term (current) use of aspirin: Secondary | ICD-10-CM | POA: Diagnosis not present

## 2017-08-03 DIAGNOSIS — E785 Hyperlipidemia, unspecified: Secondary | ICD-10-CM | POA: Diagnosis not present

## 2017-08-03 DIAGNOSIS — C50412 Malignant neoplasm of upper-outer quadrant of left female breast: Secondary | ICD-10-CM

## 2017-08-03 DIAGNOSIS — Z79899 Other long term (current) drug therapy: Secondary | ICD-10-CM | POA: Diagnosis not present

## 2017-08-03 DIAGNOSIS — R198 Other specified symptoms and signs involving the digestive system and abdomen: Secondary | ICD-10-CM

## 2017-08-03 DIAGNOSIS — I10 Essential (primary) hypertension: Secondary | ICD-10-CM | POA: Insufficient documentation

## 2017-08-03 DIAGNOSIS — R140 Abdominal distension (gaseous): Secondary | ICD-10-CM | POA: Diagnosis not present

## 2017-08-03 DIAGNOSIS — E119 Type 2 diabetes mellitus without complications: Secondary | ICD-10-CM | POA: Diagnosis not present

## 2017-08-03 DIAGNOSIS — K30 Functional dyspepsia: Secondary | ICD-10-CM | POA: Insufficient documentation

## 2017-08-03 LAB — CBC WITH DIFFERENTIAL (CANCER CENTER ONLY)
Basophils Absolute: 0.1 10*3/uL (ref 0.0–0.1)
Basophils Relative: 1 %
Eosinophils Absolute: 0.4 10*3/uL (ref 0.0–0.5)
Eosinophils Relative: 7 %
HCT: 32.4 % — ABNORMAL LOW (ref 34.8–46.6)
Hemoglobin: 10.6 g/dL — ABNORMAL LOW (ref 11.6–15.9)
Lymphocytes Relative: 45 %
Lymphs Abs: 2.7 10*3/uL (ref 0.9–3.3)
MCH: 29.4 pg (ref 25.1–34.0)
MCHC: 32.7 g/dL (ref 31.5–36.0)
MCV: 89.8 fL (ref 79.5–101.0)
Monocytes Absolute: 0.4 10*3/uL (ref 0.1–0.9)
Monocytes Relative: 6 %
Neutro Abs: 2.4 10*3/uL (ref 1.5–6.5)
Neutrophils Relative %: 41 %
Platelet Count: 202 10*3/uL (ref 145–400)
RBC: 3.61 MIL/uL — ABNORMAL LOW (ref 3.70–5.45)
RDW: 13 % (ref 11.2–14.5)
WBC Count: 5.9 10*3/uL (ref 3.9–10.3)

## 2017-08-03 LAB — CMP (CANCER CENTER ONLY)
ALT: 40 U/L (ref 0–55)
AST: 33 U/L (ref 5–34)
Albumin: 4.3 g/dL (ref 3.5–5.0)
Alkaline Phosphatase: 69 U/L (ref 40–150)
Anion gap: 7 (ref 3–11)
BUN: 19 mg/dL (ref 7–26)
CO2: 26 mmol/L (ref 22–29)
Calcium: 9.9 mg/dL (ref 8.4–10.4)
Chloride: 109 mmol/L (ref 98–109)
Creatinine: 1.72 mg/dL — ABNORMAL HIGH (ref 0.60–1.10)
GFR, Est AFR Am: 34 mL/min — ABNORMAL LOW (ref >60)
GFR, Estimated: 29 mL/min — ABNORMAL LOW (ref >60)
Glucose, Bld: 125 mg/dL (ref 70–140)
Potassium: 4.7 mmol/L (ref 3.5–5.1)
Sodium: 142 mmol/L (ref 136–145)
Total Bilirubin: 0.5 mg/dL (ref 0.2–1.2)
Total Protein: 7.7 g/dL (ref 6.4–8.3)

## 2017-08-03 NOTE — Telephone Encounter (Signed)
Gave patient AVs and calendar of upcoming may 2020 appointments. Patient sch for lab addon per 5/14 los.

## 2017-08-03 NOTE — Progress Notes (Signed)
CLINIC:  Survivorship   REASON FOR VISIT:  Routine follow-up for history of breast cancer.   BRIEF ONCOLOGIC HISTORY:    Breast cancer of upper-outer quadrant of left female breast ([place])   01/15/2010 Surgery     bilateral mastectomies: left breast IDC , ER/PR positive HER-2 positive  stage II a;  right breast benign      04/17/2010 - 04/17/2011 Chemotherapy    weekly Taxotere carboplatinum and Herceptin x 6 cycles  followed by Herceptin maintenance      09/01/2010 - 09/02/2015 Anti-estrogen oral therapy     letrozole 2.5 mg daily (BCI revealed 10%, high risk of recurrence but low probability of benefit from extended adjuvant therapy)       Radiation Therapy     The patient saw No care team member to display for radiation treatment. This is the current list of radiation treatment: Radiation Treatments    No radiation treatments to show. (Treatments may have been administered in another system.)            INTERVAL HISTORY:  [name] presents to the [facility] today for routine follow-up for her history of breast cancer.  Overall, she reports feeling quite well. She sees her PCP regularly.  She is not exercising, though she and her daughter have a gym membership and she vows to do better.  She did note some GI upset recently with some bloating and abdominal fullness for about 1 year.  She is unsure the cause.     REVIEW OF SYSTEMS:  Review of Systems  Constitutional: Negative for appetite change, chills, fatigue, fever and unexpected weight change.  HENT:   Negative for hearing loss, lump/mass and trouble swallowing.   Eyes: Negative for eye problems and icterus.  Respiratory: Negative for chest tightness, cough and shortness of breath.   Cardiovascular: Negative for chest pain, leg swelling and palpitations.  Gastrointestinal: Negative for abdominal distention, abdominal pain, constipation, diarrhea, nausea and vomiting.  Endocrine: Negative for hot flashes.    Skin: Negative for itching and rash.  Neurological: Negative for dizziness, extremity weakness, headaches and numbness.  Hematological: Negative for adenopathy.  Psychiatric/Behavioral: Negative for depression. The patient is not nervous/anxious.   Breast: Denies any new nodularity, masses, tenderness, nipple changes, or nipple discharge.       PAST MEDICAL/SURGICAL HISTORY:  Past Medical History:  Diagnosis Date  . Arthritis    DJD, lumbar spondylosis  . Asthma   . Back pain   . Bilateral renal cysts   . Breast cancer ([place])    left breast invasive ductal ca in remission as of 10/07/11 s/p double mastectomy  . Bronchitis 03/10/2012  . Cataract   . Chemotherapy follow-up examination 03/06/2011  . Colon polyps   . Cough 03/06/2011  . Depression    seasonal melancholia  . E. coli UTI   . Edema extremities   . GERD (gastroesophageal reflux disease)   . Hyperlipidemia   . Hypertension   . Ileitis   . Kidney stones    non obstructive   . Myopathy    not statin related, cause unknown    Past Surgical History:  Procedure Laterality Date  . ABDOMINAL HYSTERECTOMY     for fibroids   . APPENDECTOMY    . BREAST SURGERY  2011   Bilateral mastectomy  . INCONTINENCE SURGERY    . LAMINECTOMY     C5-6 Dr. Louanne Skye 2005   . MASTECTOMY     bilateral   .  OOPHORECTOMY    . TONSILLECTOMY       ALLERGIES:  Allergies  Allergen Reactions  . Atorvastatin     REACTION: Nausea, abdominal pain, and reflux. Pt has used the medication in the past and experienced the same reaction before the medication was discontinued.  Marland Kitchen Hctz [Hydrochlorothiazide]     Blurry vision  . Norvasc [Amlodipine Besylate]     Blurry vision  . Rosuvastatin     REACTION: Itches     CURRENT MEDICATIONS:  Outpatient Encounter Medications as of 08/03/2017  Medication Sig  . ACCU-CHEK FASTCLIX LANCETS MISC Check blood sugar daily  . albuterol (PROVENTIL HFA;VENTOLIN HFA) 108 (90 Base) MCG/ACT inhaler INHALE  1 TO 2 PUFFS INTO THE LUNGS EVERY 6 HOURS AS NEEDED FOR WHEEZING OR SHORTNESS OF BREATH.  Marland Kitchen aspirin 81 MG tablet Take 81 mg by mouth daily.  . Blood Glucose Calibration (PRODIGY CONTROL SOLUTION) Low SOLN 1 each by In Vitro route every morning.  . Blood Glucose Monitoring Suppl (ACCU-CHEK AVIVA PLUS) w/Device KIT Check blood sugar daily as intructed  . carvedilol (COREG) 25 MG tablet TAKE 1/2 TABLET TWICE DAILY WITH MEALS  . cholecalciferol (VITAMIN D) 1000 UNITS tablet Take 1,000 Units by mouth daily.  . fluticasone (FLONASE) 50 MCG/ACT nasal spray Place 2 sprays into both nostrils daily.  Marland Kitchen glucose blood (ACCU-CHEK AVIVA PLUS) test strip Check blood sugar one time a day as instructed  . hydrALAZINE (APRESOLINE) 25 MG tablet Take 1 tablet (25 mg total) by mouth 2 (two) times daily.  Marland Kitchen loratadine (CLARITIN) 10 MG tablet Take 1 tablet (10 mg total) by mouth daily.  Marland Kitchen LORazepam (ATIVAN) 1 MG tablet Take 1 tablet (1 mg total) by mouth as needed. This is a 30 day supply  . metFORMIN (GLUCOPHAGE) 500 MG tablet TAKE 1 TABLET EVERY DAY WITH BREAKFAST  . Multiple Vitamin (MULTIVITAMIN) tablet Take 1 tablet by mouth daily.   Marland Kitchen olmesartan (BENICAR) 20 MG tablet TAKE 1 TABLET (20 MG TOTAL) BY MOUTH DAILY.  . simvastatin (ZOCOR) 20 MG tablet Take 1 tablet (20 mg total) by mouth at bedtime.  Marland Kitchen spironolactone (ALDACTONE) 25 MG tablet Take 1 tablet (25 mg total) by mouth daily.  . traMADol (ULTRAM) 50 MG tablet TAKE 1 TABLET EVERY DAY AS NEEDED FOR PAIN  . triamcinolone (KENALOG) 0.1 % paste Use as directed 1 application in the mouth or throat 2 (two) times daily.   No facility-administered encounter medications on file as of 08/03/2017.      ONCOLOGIC FAMILY HISTORY:  Family History  Problem Relation Age of Onset  . Heart disease Mother 56       CAD  . Breast cancer Mother   . Cancer Mother 48       Breast cancer, mother  . Cancer Father 52       colon cancer dx'ed age 42  . Diabetes Father   .  Coronary artery disease Other   . Multiple sclerosis Daughter   . Diabetes Brother   . Diabetes Brother     SOCIAL HISTORY:  Social History   Socioeconomic History  . Marital status: Single    Spouse name: Not on file  . Number of children: Not on file  . Years of education: Not on file  . Highest education level: Not on file  Occupational History  . Not on file  Social Needs  . Financial resource strain: Not on file  . Food insecurity:    Worry: Not on file  Inability: Not on file  . Transportation needs:    Medical: Not on file    Non-medical: Not on file  Tobacco Use  . Smoking status: Never Smoker  . Smokeless tobacco: Never Used  Substance and Sexual Activity  . Alcohol use: No    Alcohol/week: 0.0 oz  . Drug use: No  . Sexual activity: Not on file  Lifestyle  . Physical activity:    Days per week: Not on file    Minutes per session: Not on file  . Stress: Not on file  Relationships  . Social connections:    Talks on phone: Not on file    Gets together: Not on file    Attends religious service: Not on file    Active member of club or organization: Not on file    Attends meetings of clubs or organizations: Not on file    Relationship status: Not on file  . Intimate partner violence:    Fear of current or ex partner: Not on file    Emotionally abused: Not on file    Physically abused: Not on file    Forced sexual activity: Not on file  Other Topics Concern  . Not on file  Social History Narrative   Single, 2 kids   Never smoked   No alcohol use   No drugs   Laid off from [employer] after working there for 14 years.       PHYSICAL EXAMINATION:  Vital Signs: Vitals:   08/03/17 1037  BP: (!) 151/87  Pulse: 77  Resp: 18  Temp: 98.8 F (37.1 C)  SpO2: 100%   Filed Weights   08/03/17 1037  Weight: 238 lb 11.2 oz (108.3 kg)   General: Well-nourished, well-appearing female in no acute distress.  Accompanied by her daughter today.     HEENT: Head is normocephalic.  Pupils equal and reactive to light. Conjunctivae clear without exudate.  Sclerae anicteric. Oral mucosa is pink, moist.  Oropharynx is pink without lesions or erythema.  Lymph: No cervical, supraclavicular, or infraclavicular lymphadenopathy noted on palpation.  Cardiovascular: Regular rate and rhythm.Marland Kitchen Respiratory: Clear to auscultation bilaterally. Chest expansion symmetric; breathing non-labored.  Breast Exam:  -Left breast: surgically absent, no masses or nodularity noted -Right breast: surgically absent, no nodularity noted -Axilla: No axillary adenopathy bilaterally.  GI: Abdomen soft and round; non-tender, non-distended. Bowel sounds normoactive. No hepatosplenomegaly.   GU: Deferred.  Neuro: No focal deficits. Steady gait.  Psych: Mood and affect normal and appropriate for situation.  MSK: No focal spinal tenderness to palpation, full range of motion in bilateral upper extremities Extremities: No edema. Skin: Warm and dry.  LABORATORY DATA:  None for this visit   DIAGNOSTIC IMAGING:  Most recent mammogram:  N/a s/p bilateral mastectomies    ASSESSMENT AND PLAN:  Ms.. Leach is a pleasant 67 y.o. female with history of Stage IIA left breast invasive ductal carcinoma, ER+/PR+/HER2+, diagnosed in 12/2009, treated with bilateral mastectomies, adjuvant chemotherapy, Herceptin maintenance, and anti-estrogen therapy with Letrozole x 5 years completing therapy in 08/2015.  She [facility] for surveillance and routine follow-up.   1. History of breast cancer:  [name] is currently clinically and radiographically without evidence of disease or recurrence of breast cancer.  I encouraged her to call me with any questions or concerns before her next visit at the cancer center, and I would be happy to see her sooner, if needed.    2.  Abdominal  fullness: We will get a CMET today and a RUQ ultrasound to fully evaluate.    3. Bone  health:  Given [name]'s age, history of breast cancer, and her previous anti-estrogen therapy with Letrozole, she is at risk for bone demineralization. Her last DEXA scan was in 2015 and was normal.  She can repeat this in one year (as she is no longer taking Letrozole).  She was given education on specific food and activities to promote bone health.  4. Cancer screening:  Due to [name]'s history and her age, she should receive screening for skin cancers, colon cancer. She was encouraged to follow-up with her PCP for appropriate cancer screenings.   5. Health maintenance and wellness promotion: [name] was encouraged to consume 5-7 servings of fruits and vegetables per day. She was also encouraged to engage in moderate to vigorous exercise for 30 minutes per day most days of the week. She was instructed to limit her alcohol consumption and continue to abstain from tobacco use.    Dispo:  -Return to cancer center in one year for LTS follow up   A total of (30) minutes of face-to-face time was spent with this patient with greater than 50% of that time in counseling and care-coordination.   Gardenia Phlegm, [place] [phone]   Note: PRIMARY CARE PROVIDER [name], [place] [phone]

## 2017-08-03 NOTE — Patient Instructions (Signed)
Bone Health Bones protect organs, store calcium, and anchor muscles. Good health habits, such as eating nutritious foods and exercising regularly, are important for maintaining healthy bones. They can also help to prevent a condition that causes bones to lose density and become weak and brittle (osteoporosis). Why is bone mass important? Bone mass refers to the amount of bone tissue that you have. The higher your bone mass, the stronger your bones. An important step toward having healthy bones throughout life is to have strong and dense bones during childhood. A young adult who has a high bone mass is more likely to have a high bone mass later in life. Bone mass at its greatest it is called peak bone mass. A large decline in bone mass occurs in older adults. In women, it occurs about the time of menopause. During this time, it is important to practice good health habits, because if more bone is lost than what is replaced, the bones will become less healthy and more likely to break (fracture). If you find that you have a low bone mass, you may be able to prevent osteoporosis or further bone loss by changing your diet and lifestyle. How can I find out if my bone mass is low? Bone mass can be measured with an X-ray test that is called a bone mineral density (BMD) test. This test is recommended for all women who are age 65 or older. It may also be recommended for men who are age 70 or older, or for people who are more likely to develop osteoporosis due to:  Having bones that break easily.  Having a long-term disease that weakens bones, such as kidney disease or rheumatoid arthritis.  Having menopause earlier than normal.  Taking medicine that weakens bones, such as steroids, thyroid hormones, or hormone treatment for breast cancer or prostate cancer.  Smoking.  Drinking three or more alcoholic drinks each day.  What are the nutritional recommendations for healthy bones? To have healthy bones, you  need to get enough of the right minerals and vitamins. Most nutrition experts recommend getting these nutrients from the foods that you eat. Nutritional recommendations vary from person to person. Ask your health care provider what is healthy for you. Here are some general guidelines. Calcium Recommendations Calcium is the most important (essential) mineral for bone health. Most people can get enough calcium from their diet, but supplements may be recommended for people who are at risk for osteoporosis. Good sources of calcium include:  Dairy products, such as low-fat or nonfat milk, cheese, and yogurt.  Dark green leafy vegetables, such as bok choy and broccoli.  Calcium-fortified foods, such as orange juice, cereal, bread, soy beverages, and tofu products.  Nuts, such as almonds.  Follow these recommended amounts for daily calcium intake:  Children, age 1?3: 700 mg.  Children, age 4?8: 1,000 mg.  Children, age 9?13: 1,300 mg.  Teens, age 14?18: 1,300 mg.  Adults, age 19?50: 1,000 mg.  Adults, age 51?70: ? Men: 1,000 mg. ? Women: 1,200 mg.  Adults, age 71 or older: 1,200 mg.  Pregnant and breastfeeding females: ? Teens: 1,300 mg. ? Adults: 1,000 mg.  Vitamin D Recommendations Vitamin D is the most essential vitamin for bone health. It helps the body to absorb calcium. Sunlight stimulates the skin to make vitamin D, so be sure to get enough sunlight. If you live in a cold climate or you do not get outside often, your health care provider may recommend that you take vitamin   D supplements. Good sources of vitamin D in your diet include:  Egg yolks.  Saltwater fish.  Milk and cereal fortified with vitamin D.  Follow these recommended amounts for daily vitamin D intake:  Children and teens, age 1?18: 600 international units.  Adults, age 50 or younger: 400-800 international units.  Adults, age 51 or older: 800-1,000 international units.  Other Nutrients Other nutrients  for bone health include:  Phosphorus. This mineral is found in meat, poultry, dairy foods, nuts, and legumes. The recommended daily intake for adult men and adult women is 700 mg.  Magnesium. This mineral is found in seeds, nuts, dark green vegetables, and legumes. The recommended daily intake for adult men is 400?420 mg. For adult women, it is 310?320 mg.  Vitamin K. This vitamin is found in green leafy vegetables. The recommended daily intake is 120 mg for adult men and 90 mg for adult women.  What type of physical activity is best for building and maintaining healthy bones? Weight-bearing and strength-building activities are important for building and maintaining peak bone mass. Weight-bearing activities cause muscles and bones to work against gravity. Strength-building activities increases muscle strength that supports bones. Weight-bearing and muscle-building activities include:  Walking and hiking.  Jogging and running.  Dancing.  Gym exercises.  Lifting weights.  Tennis and racquetball.  Climbing stairs.  Aerobics.  Adults should get at least 30 minutes of moderate physical activity on most days. Children should get at least 60 minutes of moderate physical activity on most days. Ask your health care provide what type of exercise is best for you. Where can I find more information? For more information, check out the following websites:  National Osteoporosis Foundation: http://nof.org/learn/basics  National Institutes of Health: http://www.niams.nih.gov/Health_Info/Bone/Bone_Health/bone_health_for_life.asp  This information is not intended to replace advice given to you by your health care provider. Make sure you discuss any questions you have with your health care provider. Document Released: 05/30/2003 Document Revised: 09/27/2015 Document Reviewed: 03/14/2014 Elsevier Interactive Patient Education  2018 Elsevier Inc.  

## 2017-08-27 ENCOUNTER — Ambulatory Visit (HOSPITAL_COMMUNITY)
Admission: RE | Admit: 2017-08-27 | Discharge: 2017-08-27 | Disposition: A | Discharge status: Home / Self Care | Payer: Medicare HMO | Source: Ambulatory Visit | Attending: Adult Health | Admitting: Adult Health

## 2017-08-27 ENCOUNTER — Ambulatory Visit (HOSPITAL_COMMUNITY): Payer: Medicare HMO

## 2017-08-27 ENCOUNTER — Telehealth: Payer: Self-pay

## 2017-08-27 DIAGNOSIS — C50412 Malignant neoplasm of upper-outer quadrant of left female breast: Secondary | ICD-10-CM | POA: Diagnosis not present

## 2017-08-27 DIAGNOSIS — K760 Fatty (change of) liver, not elsewhere classified: Secondary | ICD-10-CM | POA: Insufficient documentation

## 2017-08-27 DIAGNOSIS — R198 Other specified symptoms and signs involving the digestive system and abdomen: Secondary | ICD-10-CM | POA: Insufficient documentation

## 2017-08-27 NOTE — Telephone Encounter (Signed)
-----   Message from Gardenia Phlegm, NP sent at 08/27/2017  2:39 PM EDT ----- Please let her know that her ultrasound showed fatty liver.   ----- Message ----- From: Interface, Rad Results In Sent: 08/27/2017   1:35 PM To: Gardenia Phlegm, NP

## 2017-08-27 NOTE — Telephone Encounter (Signed)
Called and left below message. Instructed to call the office if she has any questions.

## 2017-09-08 ENCOUNTER — Other Ambulatory Visit: Payer: Self-pay | Admitting: *Deleted

## 2017-09-08 MED ORDER — FLUCONAZOLE 150 MG PO TABS: 150.0000 mg | ORAL_TABLET | Freq: Every day | ORAL | 0 refills | Status: DC

## 2017-09-08 NOTE — Telephone Encounter (Signed)
Pt calls and states she started using personal wipes about 3 weeks ago after voiding and having bowel movements now she has a yeast infection. She would like diflucan sent in, she has stopped using wipes. She has appt next week

## 2017-09-14 ENCOUNTER — Other Ambulatory Visit: Payer: Self-pay

## 2017-09-14 ENCOUNTER — Encounter: Payer: Self-pay | Admitting: Dietician

## 2017-09-14 ENCOUNTER — Other Ambulatory Visit: Payer: Self-pay | Admitting: Internal Medicine

## 2017-09-14 ENCOUNTER — Ambulatory Visit (INDEPENDENT_AMBULATORY_CARE_PROVIDER_SITE_OTHER): Payer: 59 | Admitting: Internal Medicine

## 2017-09-14 VITALS — BP 145/84 | HR 77 | Temp 98.2°F | Wt 243.3 lb

## 2017-09-14 DIAGNOSIS — D638 Anemia in other chronic diseases classified elsewhere: Secondary | ICD-10-CM

## 2017-09-14 DIAGNOSIS — M549 Dorsalgia, unspecified: Secondary | ICD-10-CM

## 2017-09-14 DIAGNOSIS — M5136 Other intervertebral disc degeneration, lumbar region: Secondary | ICD-10-CM | POA: Diagnosis not present

## 2017-09-14 DIAGNOSIS — N183 Chronic kidney disease, stage 3 (moderate): Secondary | ICD-10-CM

## 2017-09-14 DIAGNOSIS — Z9013 Acquired absence of bilateral breasts and nipples: Secondary | ICD-10-CM

## 2017-09-14 DIAGNOSIS — M1711 Unilateral primary osteoarthritis, right knee: Secondary | ICD-10-CM

## 2017-09-14 DIAGNOSIS — E119 Type 2 diabetes mellitus without complications: Secondary | ICD-10-CM

## 2017-09-14 DIAGNOSIS — R350 Frequency of micturition: Secondary | ICD-10-CM

## 2017-09-14 DIAGNOSIS — N3000 Acute cystitis without hematuria: Secondary | ICD-10-CM | POA: Diagnosis not present

## 2017-09-14 DIAGNOSIS — Z79891 Long term (current) use of opiate analgesic: Secondary | ICD-10-CM

## 2017-09-14 DIAGNOSIS — J302 Other seasonal allergic rhinitis: Secondary | ICD-10-CM

## 2017-09-14 DIAGNOSIS — B9620 Unspecified Escherichia coli [E. coli] as the cause of diseases classified elsewhere: Secondary | ICD-10-CM | POA: Diagnosis not present

## 2017-09-14 DIAGNOSIS — I5030 Unspecified diastolic (congestive) heart failure: Secondary | ICD-10-CM | POA: Diagnosis not present

## 2017-09-14 DIAGNOSIS — I130 Hypertensive heart and chronic kidney disease with heart failure and stage 1 through stage 4 chronic kidney disease, or unspecified chronic kidney disease: Secondary | ICD-10-CM

## 2017-09-14 DIAGNOSIS — I10 Essential (primary) hypertension: Secondary | ICD-10-CM

## 2017-09-14 DIAGNOSIS — Z853 Personal history of malignant neoplasm of breast: Secondary | ICD-10-CM

## 2017-09-14 DIAGNOSIS — E1122 Type 2 diabetes mellitus with diabetic chronic kidney disease: Secondary | ICD-10-CM

## 2017-09-14 DIAGNOSIS — L298 Other pruritus: Secondary | ICD-10-CM | POA: Diagnosis not present

## 2017-09-14 DIAGNOSIS — K760 Fatty (change of) liver, not elsewhere classified: Secondary | ICD-10-CM | POA: Diagnosis not present

## 2017-09-14 DIAGNOSIS — Z9221 Personal history of antineoplastic chemotherapy: Secondary | ICD-10-CM

## 2017-09-14 DIAGNOSIS — Z6836 Body mass index (BMI) 36.0-36.9, adult: Secondary | ICD-10-CM

## 2017-09-14 DIAGNOSIS — M5127 Other intervertebral disc displacement, lumbosacral region: Secondary | ICD-10-CM

## 2017-09-14 DIAGNOSIS — Z7984 Long term (current) use of oral hypoglycemic drugs: Secondary | ICD-10-CM

## 2017-09-14 DIAGNOSIS — N3001 Acute cystitis with hematuria: Secondary | ICD-10-CM

## 2017-09-14 DIAGNOSIS — G8929 Other chronic pain: Secondary | ICD-10-CM

## 2017-09-14 DIAGNOSIS — G4733 Obstructive sleep apnea (adult) (pediatric): Secondary | ICD-10-CM

## 2017-09-14 DIAGNOSIS — Z8719 Personal history of other diseases of the digestive system: Secondary | ICD-10-CM

## 2017-09-14 DIAGNOSIS — K635 Polyp of colon: Secondary | ICD-10-CM

## 2017-09-14 DIAGNOSIS — M1990 Unspecified osteoarthritis, unspecified site: Secondary | ICD-10-CM

## 2017-09-14 DIAGNOSIS — Z79899 Other long term (current) drug therapy: Secondary | ICD-10-CM

## 2017-09-14 LAB — POCT URINALYSIS DIPSTICK
Bilirubin, UA: NEGATIVE
Glucose, UA: NEGATIVE
Ketones, UA: NEGATIVE
Nitrite, UA: NEGATIVE
Protein, UA: NEGATIVE
Spec Grav, UA: 1.025 (ref 1.010–1.025)
Urobilinogen, UA: 0.2 E.U./dL
pH, UA: 5.5 (ref 5.0–8.0)

## 2017-09-14 LAB — POCT GLYCOSYLATED HEMOGLOBIN (HGB A1C): Hemoglobin A1C: 7.1 % — AB (ref 4.0–5.6)

## 2017-09-14 LAB — GLUCOSE, CAPILLARY: Glucose-Capillary: 158 mg/dL — ABNORMAL HIGH (ref 70–99)

## 2017-09-14 NOTE — Progress Notes (Signed)
CC: follow up of hypertension   HPI:  [name] is a 67 y.o. with PMH  HTN, OSA, T2DM, OA, anemia of chronic disease, diastolic heart failure, CKD 3, breast cancer (s/p bl mastectomies followed by chemotherapy with TCH and letroxole)  who presents for follow up of hypertension and diabetes. Please see the assessment and plans for the status of the patient chronic medical problems.    Past Medical History:  Diagnosis Date  . Arthritis    DJD, lumbar spondylosis  . Asthma   . Back pain   . Bilateral renal cysts   . Breast cancer ([place])    left breast invasive ductal ca in remission as of 10/07/11 s/p double mastectomy  . Bronchitis 03/10/2012  . Cataract   . Chemotherapy follow-up examination 03/06/2011  . Colon polyps   . Cough 03/06/2011  . Depression    seasonal melancholia  . E. coli UTI   . Edema extremities   . GERD (gastroesophageal reflux disease)   . Hyperlipidemia   . Hypertension   . Ileitis   . Kidney stones    non obstructive   . Myopathy    not statin related, cause unknown    Review of Systems:  Refer to history of present illness and assessment and plans for pertinent review of systems, all others reviewed and negative  Physical Exam:  Vitals:   09/14/17 1405 09/14/17 1528  BP: (!) 161/72 (!) 145/84  Pulse: 77 77  Temp: 98.2 F (36.8 C)   TempSrc: Oral   SpO2: 100%   Weight: 243 lb 4.8 oz (110.4 kg)    General: well appearing, no acute distress  Cardiac: regular rate and rhythm, normal S1 and S2, no murmur appreciated, no peripheral edema  Pulm: lungs clear to auscultation, normal work of breathing, no rhonchi or wheezes  GI: BS+. Soft, non tender, non distended  Skin: the feet are well groomed without skin changes or ulcers   Assessment & Plan:   Acute Cystitis  Describes increased urinary frequency for the last few days. This is associated with vaginal pruritis. She denies dysuria or vaginal discharge. She denies any changes skin  changes such as thinning of the labia. She is not sexually active.  On urine dipstick today she has a small amount of leukocytes, nitrite negative. Urine cultures showed > 10,000 colonies of E.coli.  - tmp smx one double strength tablet once daily for 3 days, this is dosing for her current GFR   Fatty liver  Noted on RUQ ultrasound during workup for abdominal pain and bloating. She does not have  know of exposure history to hepatitis such as high risk sexual activity, IVDU, or previous blood transfusion. She does not drink etoh in excess, in fact she has only had alcohol once in her life. Based on weight she is morbidly obese.  - encouraged weight loss, she says that she will begin exercising with her daughter at the gym  - testing for hepatitis B and C > labs are not consistent with exposure to hep a,b, or c, will proceed with hep A and B vaccination, Mr. Authier will arrange for a time to complete the series which works with her schedule  Hep b vaccine 0, 1, 6 months [minimum intervals: 4 weeks between doses 1 and 2, 8 weeks between doses 2 and 3, 16 weeks between doses 1 and 3] Hep a vaccine 2-dose series HepA (Havrix 6-12 months apart or Vaqta 6-18 months apart  Hypertension  Initial blood pressure 161/72, repeat blood pressure 145/82. Home medications include hydralazine 25 mg BID, spironolactone 25 mg daily, olmesartan 20 mg daily, and carvedilol 12.5 mg qd. She will try to obtain some blood pressure readings outside of the office  - continue current blood pressure medications  - BMP two months ago with CKD III, no electrolyte abnormalities   Type 2 Diabetes  - this has been controlled with metformin 500 mg daily, when she has tried taking 1000 mg daily in the past it caused her significant GI discomfort  - A1c 7.1 today, consistent with prior 7.0 03/30/2017 - increase metformin to 1000 mg daily and change to extended release formulation  - no side effects of statin therapy, continue  simvastatin 20 mg daily  - foot exam today without ulcers or skin breakdown  - last eye exam 4 months ago with no retinopathy  Chronic back pain  Continues to experience chronic lower back pain which is not associated with radiculopathy and worsened with poor posture. On exam she has reproducible tenderness over the lumbosacral facet joint. Last MRI was in 2008 showed disk herniation L5-S1 and degenerative disk disease of L3-4 and L4-5. She hasn't tried tylenol or physical therapy yet. She finds that tramadol helps for very bad days and she only needs to use this about two to three times weekly.  - start tylenol  - she plans to start going to the gym with her daughter and opts to defer PT for now  - refilled tramadol 50 mg #30 for bad days when pain is not relieved with tylenol, she expects this to last 30 days  - the database was reviewed and is appropriate, she will need UDS and pain contract at next office visit   Tubular adenomas/ Hyperplastic colon polyps Last colonoscopy 8/28/ 2018: 9 sessile polyps, 2 to 4 mm in size  Pathology : tubular adenomas without high grade dysplasia or malignancy  Follow up: repeat colonoscopy 3-5 years    See Encounters Tab for problem based charting.  Patient discussed with Dr. Angelia Mould

## 2017-09-14 NOTE — Patient Instructions (Addendum)
Thank you for coming to the clinic today. It was a pleasure to see you.   For your increased urinary frequency, I have ordered a urine test today and will call you with the results   For your back pain - try using tylenol and a menthol cream, also try heat/ ice, massage, and stretching   You are due for your yearly eye exam with your eye doctor, please ask them to send [place] the results   FOLLOW-UP INSTRUCTIONS When: 6 months with Dr. Hetty Ely  For: follow up of your diabetes  What to bring: all of your medication bottles   Please call our clinic if you have any questions or concerns, we may be able to help and keep you from a long and expensive emergency room wait. Our clinic and after hours phone number is [phone], the best time to call is Monday through Friday 9 am to 4 pm but there is always someone available 24/7 if you have an emergency. If you need medication refills please notify your pharmacy one week in advance and they will send [place] a request.

## 2017-09-15 LAB — URINALYSIS, COMPLETE
Bilirubin, UA: NEGATIVE
Glucose, UA: NEGATIVE
Ketones, UA: NEGATIVE
Nitrite, UA: POSITIVE — AB
Protein, UA: NEGATIVE
Specific Gravity, UA: 1.012 (ref 1.005–1.030)
Urobilinogen, Ur: 0.2 mg/dL (ref 0.2–1.0)
pH, UA: 5 (ref 5.0–7.5)

## 2017-09-15 LAB — MICROSCOPIC EXAMINATION: Casts: NONE SEEN /lpf

## 2017-09-15 LAB — HEPATITIS C ANTIBODY: Hep C Virus Ab: <0.1 s/co ratio (ref 0.0–0.9)

## 2017-09-15 LAB — HEPATITIS B SURFACE ANTIGEN: Hepatitis B Surface Ag: NEGATIVE

## 2017-09-15 LAB — HEPATITIS B CORE AB W/REFLEX: Hep B Core Total Ab: NEGATIVE

## 2017-09-15 LAB — HEPATITIS B SURFACE ANTIBODY,QUALITATIVE: Hep B Surface Ab, Qual: NONREACTIVE

## 2017-09-15 LAB — HEPATITIS A ANTIBODY, TOTAL: Hep A Total Ab: NEGATIVE

## 2017-09-18 LAB — URINE CULTURE

## 2017-09-22 ENCOUNTER — Encounter: Payer: Self-pay | Admitting: Internal Medicine

## 2017-09-22 DIAGNOSIS — R350 Frequency of micturition: Secondary | ICD-10-CM | POA: Insufficient documentation

## 2017-09-22 DIAGNOSIS — N3000 Acute cystitis without hematuria: Secondary | ICD-10-CM | POA: Insufficient documentation

## 2017-09-22 DIAGNOSIS — K760 Fatty (change of) liver, not elsewhere classified: Secondary | ICD-10-CM | POA: Insufficient documentation

## 2017-09-22 MED ORDER — SULFAMETHOXAZOLE-TRIMETHOPRIM 800-160 MG PO TABS: 1.0000 | ORAL_TABLET | Freq: Two times a day (BID) | ORAL | 0 refills | Status: DC

## 2017-09-22 MED ORDER — SULFAMETHOXAZOLE-TRIMETHOPRIM 800-160 MG PO TABS: 1.0000 | ORAL_TABLET | Freq: Every day | ORAL | 0 refills | Status: DC

## 2017-09-22 MED ORDER — METFORMIN HCL ER 500 MG PO TB24: 1000.0000 mg | ORAL_TABLET | Freq: Every day | ORAL | 11 refills | Status: DC

## 2017-09-22 MED ORDER — METFORMIN HCL 1000 MG PO TABS: 1500.0000 mg | ORAL_TABLET | Freq: Every day | ORAL | 11 refills | Status: DC

## 2017-09-22 MED ORDER — FLUTICASONE PROPIONATE 50 MCG/ACT NA SUSP: 2.0000 | Freq: Every day | NASAL | 3 refills | Status: DC

## 2017-09-22 NOTE — Assessment & Plan Note (Signed)
Last colonoscopy 8/28/ 2018: 9 sessile polyps, 2 to 4 mm in size  Pathology : tubular adenomas without high grade dysplasia or malignancy  Follow up: repeat colonoscopy 3-5 years

## 2017-09-22 NOTE — Assessment & Plan Note (Signed)
Continues to experience chronic lower back pain which is not associated with radiculopathy and worsened with poor posture. On exam she has reproducible tenderness over the lumbosacral facet joint. Last MRI was in 2008 showed disk herniation L5-S1 and degenerative disk disease of L3-4 and L4-5. She hasn't tried tylenol or physical therapy yet. She finds that tramadol helps for very bad days and she only needs to use this about two to three times weekly.  - start tylenol  - she plans to start going to the gym with her daughter and opts to defer PT for now  - refilled tramadol 50 mg #30 for bad days when pain is not relieved with tylenol, she expects this to last 30 days  - the database was reviewed and is appropriate, she will need UDS and pain contract at next office visit

## 2017-09-22 NOTE — Progress Notes (Signed)
Internal Medicine Clinic Attending  Case discussed with Dr. Blum at the time of the visit.  We reviewed the resident's history and exam and pertinent patient test results.  I agree with the assessment, diagnosis, and plan of care documented in the resident's note. 

## 2017-09-22 NOTE — Assessment & Plan Note (Signed)
Noted on RUQ ultrasound during workup for abdominal pain and bloating. She does not know of exposure history to hepatitis such as high risk sexual activity, IVDU, or previous blood transfusion. She does not drink etoh in excess, in fact she has only had alcohol once in her life. Based on weight she is morbidly obese.  - encouraged weight loss, she says that she will begin exercising with her daughter at the gym  - testing for hepatitis B and C > labs are not consistent with exposure to hep a,b, or c, will proceed with hep A and B vaccination, Mr. Pingleton will arrange for a time to complete the series which works with her schedule  Hep b vaccine 0, 1, 6 months [minimum intervals: 4 weeks between doses 1 and 2, 8 weeks between doses 2 and 3, 16 weeks between doses 1 and 3] Hep a vaccine 2-dose series HepA (Havrix 6-12 months apart or Vaqta 6-18 months apart

## 2017-09-22 NOTE — Assessment & Plan Note (Signed)
Describes increased urinary frequency for the last few days. This is associated with vaginal pruritis. She denies dysuria or vaginal discharge. She denies any changes skin changes such as thinning of the labia. She is not sexually active.  On urine dipstick today she has a small amount of leukocytes, nitrite negative. Urine cultures showed > 10,000 colonies of E.coli.  - tmp smx one double strength tablet once daily for 3 days, this is dosing for her current GFR

## 2017-09-22 NOTE — Assessment & Plan Note (Signed)
-   this has been controlled with metformin 500 mg daily, when she has tried taking 1000 mg daily in the past it caused her significant GI discomfort  - A1c 7.1 today, consistent with prior 7.0 03/30/2017 - increase metformin to 1000 mg daily and change to extended release formulation  - no side effects of statin therapy, continue simvastatin 20 mg daily  - foot exam today without ulcers or skin breakdown  - last eye exam 4 months ago with no retinopathy

## 2017-09-22 NOTE — Assessment & Plan Note (Signed)
Initial blood pressure 161/72, repeat blood pressure 145/82. Home medications include hydralazine 25 mg BID, spironolactone 25 mg daily, olmesartan 20 mg daily, and carvedilol 12.5 mg qd. She will try to obtain some blood pressure readings outside of the office  - continue current blood pressure medications  - BMP two months ago with CKD III, no electrolyte abnormalities

## 2017-09-27 ENCOUNTER — Ambulatory Visit (INDEPENDENT_AMBULATORY_CARE_PROVIDER_SITE_OTHER): Payer: 59 | Admitting: *Deleted

## 2017-09-27 ENCOUNTER — Telehealth: Payer: Self-pay | Admitting: *Deleted

## 2017-09-27 DIAGNOSIS — Z23 Encounter for immunization: Secondary | ICD-10-CM | POA: Diagnosis not present

## 2017-09-27 NOTE — Progress Notes (Signed)
     Patient presents for Hep A and Hep V vaccines. Tolerated injections well. VIS statements given.  Aware that 2nd dose of Hep B due in 4 weeks and final doses of Hep A and Hep B due Mar 30, 2018. Hubbard Hartshorn, RN, BSN

## 2017-09-27 NOTE — Telephone Encounter (Signed)
Zadie Rhine Tech with [employer] called re Tramadol Rx sent 09/15/2017. States [place] does not allow them to bill for a resident Physician. Requesting Attending DEA. Today's Attending given. Will route to Engineer, building services for assistance with Resident DEA. Per Luiz Ochoa, Pharmacist at [facility], best number to contact [facility] for this is [phone]. Hubbard Hartshorn, RN, BSN

## 2017-09-27 NOTE — Telephone Encounter (Signed)
Thank you for letting me know. Thank you to the attending that helped with this.

## 2017-10-05 ENCOUNTER — Other Ambulatory Visit: Payer: Self-pay

## 2017-10-05 MED ORDER — PRODIGY SAFETY LANCETS 26G MISC: 1.0000 [IU]/d | Freq: Every day | 11 refills | Status: DC

## 2017-10-05 MED ORDER — GLUCOSE BLOOD VI STRP: ORAL_STRIP | 11 refills | Status: DC

## 2017-10-05 NOTE — Telephone Encounter (Signed)
Requesting Prodigy test strips and lancets to be filled @ [place].

## 2017-10-28 ENCOUNTER — Ambulatory Visit: Payer: 59

## 2017-11-01 ENCOUNTER — Ambulatory Visit (INDEPENDENT_AMBULATORY_CARE_PROVIDER_SITE_OTHER): Payer: Medicare HMO | Admitting: *Deleted

## 2017-11-01 DIAGNOSIS — Z23 Encounter for immunization: Secondary | ICD-10-CM | POA: Diagnosis not present

## 2017-11-01 NOTE — Progress Notes (Signed)
     Patient presents for Hep B vaccine. Tolerated injection well. VIS statement given. Hubbard Hartshorn, RN, BSN

## 2017-12-09 ENCOUNTER — Ambulatory Visit: Payer: Medicare HMO

## 2018-01-15 ENCOUNTER — Other Ambulatory Visit: Payer: Self-pay | Admitting: Internal Medicine

## 2018-01-15 DIAGNOSIS — M1711 Unilateral primary osteoarthritis, right knee: Secondary | ICD-10-CM

## 2018-01-17 ENCOUNTER — Encounter (INDEPENDENT_AMBULATORY_CARE_PROVIDER_SITE_OTHER): Payer: Self-pay

## 2018-01-17 ENCOUNTER — Ambulatory Visit (INDEPENDENT_AMBULATORY_CARE_PROVIDER_SITE_OTHER): Payer: Medicare HMO | Admitting: Internal Medicine

## 2018-01-17 ENCOUNTER — Encounter: Payer: Self-pay | Admitting: Internal Medicine

## 2018-01-17 VITALS — BP 144/70 | HR 75 | Temp 98.0°F | Ht 68.0 in | Wt 239.7 lb

## 2018-01-17 DIAGNOSIS — N189 Chronic kidney disease, unspecified: Secondary | ICD-10-CM

## 2018-01-17 DIAGNOSIS — I5032 Chronic diastolic (congestive) heart failure: Secondary | ICD-10-CM | POA: Diagnosis not present

## 2018-01-17 DIAGNOSIS — I130 Hypertensive heart and chronic kidney disease with heart failure and stage 1 through stage 4 chronic kidney disease, or unspecified chronic kidney disease: Secondary | ICD-10-CM | POA: Diagnosis not present

## 2018-01-17 DIAGNOSIS — I10 Essential (primary) hypertension: Secondary | ICD-10-CM

## 2018-01-17 DIAGNOSIS — Z79899 Other long term (current) drug therapy: Secondary | ICD-10-CM | POA: Diagnosis not present

## 2018-01-17 DIAGNOSIS — G8929 Other chronic pain: Secondary | ICD-10-CM | POA: Diagnosis not present

## 2018-01-17 DIAGNOSIS — M1711 Unilateral primary osteoarthritis, right knee: Secondary | ICD-10-CM | POA: Diagnosis not present

## 2018-01-17 DIAGNOSIS — R0789 Other chest pain: Secondary | ICD-10-CM

## 2018-01-17 LAB — SYNOVIAL CELL COUNT + DIFF, W/ CRYSTALS
Crystals, Fluid: NONE SEEN
Eosinophils-Synovial: 5 % — ABNORMAL HIGH (ref 0–1)
Lymphocytes-Synovial Fld: 51 % — ABNORMAL HIGH (ref 0–20)
Monocyte-Macrophage-Synovial Fluid: 31 % — ABNORMAL LOW (ref 50–90)
Neutrophil, Synovial: 13 % (ref 0–25)
WBC, Synovial: 53 /mm3 (ref 0–200)

## 2018-01-17 MED ORDER — CARVEDILOL 25 MG PO TABS: ORAL_TABLET | ORAL | 3 refills | Status: DC

## 2018-01-17 MED ORDER — OLMESARTAN MEDOXOMIL 20 MG PO TABS
20.0000 mg | ORAL_TABLET | Freq: Every day | ORAL | 3 refills | Status: DC
Start: 2018-01-17 — End: 2018-02-15

## 2018-01-17 MED ORDER — SPIRONOLACTONE 25 MG PO TABS: 25.0000 mg | ORAL_TABLET | Freq: Every day | ORAL | 3 refills | Status: DC

## 2018-01-17 NOTE — Patient Instructions (Signed)
Thank you for allowing [place] to provide your care today. Today we discussed your right knee osteoarthritis and performed a arthrocentesis and steroid injection of your right knee.   You may experience some swelling and pain throughout the day, which is normal. You can ice the area as needed or take tylenol or your tramadol as you normally would.  Please avoid NSAIDs (Ibuprofen, aleve, etc.) due to your chronic kidney disease.    I have ordered labs of the synovial fluid we collected today. I will call if any are abnormal.      Please follow-up as needed with your PCP.    Should you have any questions or concerns please call the internal medicine clinic at [phone].

## 2018-01-17 NOTE — Progress Notes (Signed)
After consent was obtained, using sterile technique the right lateral knee was prepped and 2cc Lidocaine 1% without epinephrine was used as local anesthetic and to anesthetize the joint space. The joint was entered and 13 ml's of serosanguinous colored synovial fluid was withdrawn and sent for cell count with differential and analysis for crystals. Triamcinolone 40 mg was then injected and the needle withdrawn. The procedure was well tolerated. The patient is asked to continue to rest the joint for a few more days before resuming regular activities. It may be more painful for the first 1-2 days. Watch for fever, or increased swelling or persistent pain in the joint. Call or return to clinic prn if such symptoms occur or there is failure to improve as anticipated.  Marty Heck, DO 01/17/2018, 4:52 PM Pager: [phone]

## 2018-01-17 NOTE — Progress Notes (Signed)
   CC: right knee pain  HPI:  Ms.Keyry C Craghead is a 67 y.o. with PMH as below presenting today with acute on chronic right knee pain.   Please see A&P for assessment of the patient's chronic medical conditions.   Past Medical History:  Diagnosis Date  . Arthritis    DJD, lumbar spondylosis  . Asthma   . Back pain   . Bilateral renal cysts   . Breast cancer ([place])    left breast invasive ductal ca in remission as of 10/07/11 s/p double mastectomy  . Bronchitis 03/10/2012  . Cataract   . Chemotherapy follow-up examination 03/06/2011  . Colon polyps   . Cough 03/06/2011  . Depression    seasonal melancholia  . E. coli UTI   . Edema extremities   . GERD (gastroesophageal reflux disease)   . Hyperlipidemia   . Hypertension   . Ileitis   . Kidney stones    non obstructive   . Myopathy    not statin related, cause unknown    Review of Systems:   Review of Systems  Constitutional: Negative for chills and fever.  Gastrointestinal: Negative for nausea and vomiting.  Musculoskeletal: Positive for joint pain. Negative for falls.  Neurological: Negative for tingling, sensory change and focal weakness.   Physical Exam:  Constitution: NAD, obese, pleasant Cardio: no JVD, trace edema LE Respiratory: non-labored breathing MSK: Full active and passive ROM LE bilaterally, no crepitus, strength equal bilaterally, sensations intact, negative medial, lateral stress test, negative lachman's test, gait wnl, knee mildly TTP Neuro: a&o, cooperative, normal affect Skin: c/d/i    Vitals:   01/17/18 1328  BP: (!) 144/70  Pulse: 75  Temp: 98 F (36.7 C)  TempSrc: Oral  SpO2: 100%  Weight: 239 lb 11.2 oz (108.7 kg)  Height: 5\' 8"  (1.727 m)     Assessment & Plan:   See Encounters Tab for problem based charting.  Patient seen with Dr. Evette Doffing

## 2018-01-17 NOTE — Assessment & Plan Note (Signed)
Chronic right knee pain due to osteoarthritis has recently gotten worse in the past two weeks, causing her to have limited mobility and interferring with ADLs. She takes tramadol intermittently for her right knee pain but states it has not been working the past few days. She states when she first stands her knee usually pops and she feels slightly unstable and stiff. It takes several steps for her to feel normal but continues to hurt, at worst an 8/10. She has had previous steroid injections, the last one being about 2.5 years ago, that have helped this pain, which she feels is similar to last time she needed a steroid injection. She denies recent illness or fevers. On PE the ligaments are stable, no crepitus present. There is mild swelling with small effusion seen on bedside US.   - Triamcinolone injection 40 mg - arthrocentesis with cell count, diff, and crystals  - f/u with PCP  In one month or if symptoms worsen  - discussed icing area or tylenol or her tramadol for pain - avoid NSAIDs with CKD

## 2018-01-17 NOTE — Assessment & Plan Note (Addendum)
BP Readings from Last 3 Encounters:  01/17/18 (!) 144/70  09/14/17 (!) 145/84  08/03/17 (!) 151/87   Blood pressure mildly increased. She will f/u with her PCP in one month and may need optimization of HTN treatment at this time as we were unable to address at this visit. She is currently on coreg 12.5 mg, benicar 20 mg qd, and spironolactone 25 mg qd  - refilled BP medications

## 2018-01-17 NOTE — Assessment & Plan Note (Signed)
She denies SOB, chest pain, or increased edema from usual. On PE, she has trace pitting edema and no JVD. She has been trying to exercise but has been limited by recent knee pain and will continue after steroid injection.   - refill coreg 12.5 mg qd and spironolactone 25 mg

## 2018-01-18 MED ORDER — TRAMADOL HCL 50 MG PO TABS: 50.0000 mg | ORAL_TABLET | ORAL | 0 refills | Status: DC | PRN

## 2018-01-18 NOTE — Addendum Note (Signed)
Addended by: Lalla Brothers T on: 01/18/2018 10:25 AM   Modules accepted: Level of Service

## 2018-01-18 NOTE — Progress Notes (Signed)
Internal Medicine Clinic Attending  I saw and evaluated the patient.  I personally confirmed the key portions of the history and exam documented by Dr. Sharon Seller and I reviewed pertinent patient test results.  The assessment, diagnosis, and plan were formulated together and I agree with the documentation in the resident's note. I was present for the entirety of the procedure.

## 2018-01-18 NOTE — Addendum Note (Signed)
Addended by: Meryl Dare on: 01/18/2018 01:04 PM   Modules accepted: Orders

## 2018-01-19 ENCOUNTER — Telehealth: Payer: Self-pay | Admitting: Internal Medicine

## 2018-01-19 NOTE — Telephone Encounter (Signed)
Spoke with patient s/p steroid injection and arthrocentesis of right knee. She states she is feeling much better and has no swelling, pain or erythema of the injection site.   Marty Heck, DO 01/19/2018, 8:47 AM Pager: [phone]

## 2018-01-20 ENCOUNTER — Telehealth: Payer: Self-pay

## 2018-01-20 ENCOUNTER — Telehealth: Payer: Self-pay | Admitting: *Deleted

## 2018-01-20 NOTE — Telephone Encounter (Addendum)
Information for PA for Podigy Testing Strips was sent through CoverMyMeds.  Awaiting decision.  Sander Nephew, RN  01/20/2018 4:32 PM.  Patient stated that a PA was needed for Tramadol 50 mg.  Call to [facility] no PA was needed for a 30 day supply.  Call to [place].  Needed directions for use.  Spoke with Dr Evette Doffing to clarify prescription.  Patient to take every 8 hours prn for severe pain.  Sander Nephew, RN 01/20/2018 4:53 PM. Fax from [place] .  Patient's strips to be covered under patient's Part B Medicare.  Sander Nephew, RN 01/24/2018 11:26 AM.

## 2018-01-24 NOTE — Telephone Encounter (Signed)
Opened in error.  [place], RN

## 2018-02-15 ENCOUNTER — Other Ambulatory Visit: Payer: Self-pay

## 2018-02-15 ENCOUNTER — Ambulatory Visit (INDEPENDENT_AMBULATORY_CARE_PROVIDER_SITE_OTHER): Payer: Medicare HMO | Admitting: Internal Medicine

## 2018-02-15 ENCOUNTER — Encounter: Payer: Self-pay | Admitting: Internal Medicine

## 2018-02-15 VITALS — BP 141/81 | HR 76 | Temp 98.4°F | Ht 68.0 in | Wt 237.4 lb

## 2018-02-15 DIAGNOSIS — E119 Type 2 diabetes mellitus without complications: Secondary | ICD-10-CM | POA: Diagnosis not present

## 2018-02-15 DIAGNOSIS — M545 Low back pain, unspecified: Secondary | ICD-10-CM

## 2018-02-15 DIAGNOSIS — Z6836 Body mass index (BMI) 36.0-36.9, adult: Secondary | ICD-10-CM

## 2018-02-15 DIAGNOSIS — E1122 Type 2 diabetes mellitus with diabetic chronic kidney disease: Secondary | ICD-10-CM | POA: Diagnosis not present

## 2018-02-15 DIAGNOSIS — M1990 Unspecified osteoarthritis, unspecified site: Secondary | ICD-10-CM | POA: Diagnosis not present

## 2018-02-15 DIAGNOSIS — K760 Fatty (change of) liver, not elsewhere classified: Secondary | ICD-10-CM | POA: Diagnosis not present

## 2018-02-15 DIAGNOSIS — Z853 Personal history of malignant neoplasm of breast: Secondary | ICD-10-CM

## 2018-02-15 DIAGNOSIS — R0789 Other chest pain: Secondary | ICD-10-CM

## 2018-02-15 DIAGNOSIS — N183 Chronic kidney disease, stage 3 unspecified: Secondary | ICD-10-CM

## 2018-02-15 DIAGNOSIS — I130 Hypertensive heart and chronic kidney disease with heart failure and stage 1 through stage 4 chronic kidney disease, or unspecified chronic kidney disease: Secondary | ICD-10-CM | POA: Diagnosis not present

## 2018-02-15 DIAGNOSIS — Z7984 Long term (current) use of oral hypoglycemic drugs: Secondary | ICD-10-CM

## 2018-02-15 DIAGNOSIS — G4733 Obstructive sleep apnea (adult) (pediatric): Secondary | ICD-10-CM

## 2018-02-15 DIAGNOSIS — I10 Essential (primary) hypertension: Secondary | ICD-10-CM | POA: Diagnosis not present

## 2018-02-15 DIAGNOSIS — D631 Anemia in chronic kidney disease: Secondary | ICD-10-CM | POA: Diagnosis not present

## 2018-02-15 DIAGNOSIS — E1159 Type 2 diabetes mellitus with other circulatory complications: Secondary | ICD-10-CM

## 2018-02-15 DIAGNOSIS — Z79899 Other long term (current) drug therapy: Secondary | ICD-10-CM

## 2018-02-15 DIAGNOSIS — Z9013 Acquired absence of bilateral breasts and nipples: Secondary | ICD-10-CM

## 2018-02-15 DIAGNOSIS — Z9221 Personal history of antineoplastic chemotherapy: Secondary | ICD-10-CM

## 2018-02-15 DIAGNOSIS — E785 Hyperlipidemia, unspecified: Secondary | ICD-10-CM | POA: Diagnosis not present

## 2018-02-15 DIAGNOSIS — I152 Hypertension secondary to endocrine disorders: Secondary | ICD-10-CM

## 2018-02-15 DIAGNOSIS — Z79891 Long term (current) use of opiate analgesic: Secondary | ICD-10-CM

## 2018-02-15 DIAGNOSIS — I5030 Unspecified diastolic (congestive) heart failure: Secondary | ICD-10-CM | POA: Diagnosis not present

## 2018-02-15 LAB — POCT GLYCOSYLATED HEMOGLOBIN (HGB A1C): Hemoglobin A1C: 6.7 % — AB (ref 4.0–5.6)

## 2018-02-15 LAB — GLUCOSE, CAPILLARY
Glucose-Capillary: 72 mg/dL (ref 70–99)
Glucose-Capillary: 81 mg/dL (ref 70–99)

## 2018-02-15 MED ORDER — OLMESARTAN MEDOXOMIL 40 MG PO TABS: 40.0000 mg | ORAL_TABLET | Freq: Every day | ORAL | 3 refills | Status: DC

## 2018-02-15 NOTE — Progress Notes (Signed)
CC: follow up of diabetes   HPI:  [name] is a 67 y.o. [name] is a 67 y.o. with PMH HTN, OSA, T2DM, OA, anemia of chronic disease, diastolic heart failure, CKD 3, breast cancer (s/p bl mastectomies followed by chemotherapy with TCH and letroxole) who presents for follow up of hypertension and diabetes.   Please see the assessment and plans for the status of the patient chronic medical problems.   Past Medical History:  Diagnosis Date  . Arthritis    DJD, lumbar spondylosis  . Asthma   . Back pain   . Bilateral renal cysts   . Breast cancer ([name])    left breast invasive ductal ca in remission as of 10/07/11 s/p double mastectomy  . Bronchitis 03/10/2012  . Cataract   . Chemotherapy follow-up examination 03/06/2011  . Colon polyps   . Cough 03/06/2011  . Depression    seasonal melancholia  . E. coli UTI   . Edema extremities   . GERD (gastroesophageal reflux disease)   . Hyperlipidemia   . Hypertension   . Ileitis   . Kidney stones    non obstructive   . Myopathy    not statin related, cause unknown    Review of Systems:  Refer to history of present illness and assessment and plans for pertinent review of systems, all others reviewed and negative  Physical Exam:  Vitals:   02/15/18 1352  BP: (!) 141/81  Pulse: 76  Temp: 98.4 F (36.9 C)  TempSrc: Oral  SpO2: 100%  Weight: 237 lb 6.4 oz (107.7 kg)  Height: 5\' 8"  (1.727 m)   General: well appearing, no acute distress  Cardiac: regular rate and rhythm, no murmur, no peripheral edema   Pulm: lungs are clear to auscultation  GI: the abdomen is soft, no tender, no palpable masses or organomegaly  MSK: normal gain, tenderness over bilateral lumbar facet joints, faber + on the left, normal strength of the lower extremities  Skin: the feet are well groomed without concerning features   Assessment & Plan:   Hypertension  Blood pressure is not at goal of SBP < 130. Currently she is  prescribed carvedilol 12.5 BID, hydralazine 25 mg BID, spironolactone 25 mg daily, and olmesartan 20 mg daily. In review of risk factors, she does not exercise but has been trying to start. She lives with her daughter and they mostly cook at home, her daughter rarely adds salt to food but they do eat fast food on occasion and have fried fish from outside of the home once weekly. She has had a sleep study in the past and was diagnosed with Mild OSA she was not able to tolerate CPAP and could not afford an oral appliance. She does not use NSAIDs. She does have slowly progressive CKD.  - increase olmesartan 20 to 40 mg daily - continue carvedilol 12.5 BID, hydralazine 25 mg BID, spironolactone 25 mg daily  CKD III  Anemia of chronic kidney disease  Patient has CKD III 2/2 hypertension and diabetes. We are focused on minimizing her risk factors while monitoring for complications and progression of renal disease. Today BMP revealed stable CKD, she has no hypocalcemia, hyperphosphatemia or low bicarb.  - obtain Vitamin D and PTH at follow up  - monitor serum calcium and phos every 6-12 months  - monitor vitamin D and PTH yearly   T2DM  Patient is here for diabetes follow up, her diabetes remains very well controlled with  hemoglobin A1c today 6.7. She is taking metformin 1000 mg daily. GFR of 45 limits our ability to start other oral medications.  - continue metformin 1000 mg daily  - ASCVD 28% currently prescribed moderate intensity simvastatin 20 mg daily and LDL last year was 48, unfortunately she has been unable to tolerate higher intensity statin such as crestor or lipitor in the past  - foot exam performed today   Long term use of opioid analgesics  Patient has been on tramadol with as needed dosing for lumbar back pain. The refills request for tramadol have been requested between appointments but today we had a chance to speak about the lower back pain. She describes a lumbar back pain which is  fluctuating and bothers her on a daily basis. The pain is in the lumbar sacral area, it is not worse in the morning or at a particular time of the day and it does not keep her at night. She has no associated radiation of the pain or lower extremity weakness. She has not tried tylenol, topical analgesics or physical therapy for the pain. She does not have a particular reason for being averse to trying these other interventions. Some days the pain limits her ability to move around the house and go to the grocery store but when she takes tramadol she is able to go about her day.  Ms. Baysinger just got a new refill of tramadol prior to our appointment. Today we created a pain contract, I reviewed the database and it was appropriate, and I collected a urine drug screen, she had no substances to claim. I strongly encouraged Ms. Khamis to try tylenol prior to taking the tylenol in the upcoming weeks and explained that we need to try the physical therapy referral at her follow up visit. We will also obtain lumbar xrays.  Morbid obesity  BMI > 35 with multiple comorbidity including non alcoholic hepatic steatosis, hypertension, diabetes, hyperlipidemia, and lumbar back pain. I have stressed the importance of weight loss.  See Encounters Tab for problem based charting.  Patient discussed with Dr. Rebeca Alert

## 2018-02-15 NOTE — Patient Instructions (Addendum)
Thank you for coming to the clinic today. It was a pleasure to see you.   For the hypertension  Increase your olmesartan to 40 mg daily   Please go for lower back xrays  Please start using tylenol 1000 mg up to four times daily, see if this helps for your back pain  At next office visit we should send you for physical therapy   FOLLOW-UP INSTRUCTIONS When: february with Dr. Hetty Leach  For: follow up of your back pain  What to bring: all of your medication bottles   Please call the internal medicine center clinic if you have any questions or concerns, we may be able to help and keep you from a long and expensive emergency room wait. Our clinic and after hours phone number is [phone], the best time to call is Monday through Friday 9 am to 4 pm but there is always someone available 24/7 if you have an emergency. If you need medication refills please notify your pharmacy one week in advance and they will send [place] a request.

## 2018-02-16 ENCOUNTER — Encounter: Payer: Self-pay | Admitting: Internal Medicine

## 2018-02-16 LAB — BMP8+ANION GAP
Anion Gap: 18 mmol/L (ref 10.0–18.0)
BUN/Creatinine Ratio: 13 (ref 12–28)
BUN: 18 mg/dL (ref 8–27)
CO2: 21 mmol/L (ref 20–29)
Calcium: 9.7 mg/dL (ref 8.7–10.3)
Chloride: 104 mmol/L (ref 96–106)
Creatinine, Ser: 1.38 mg/dL — ABNORMAL HIGH (ref 0.57–1.00)
GFR calc Af Amer: 45 mL/min/{1.73_m2} — ABNORMAL LOW (ref >59)
GFR calc non Af Amer: 39 mL/min/{1.73_m2} — ABNORMAL LOW (ref >59)
Glucose: 84 mg/dL (ref 65–99)
Potassium: 4.7 mmol/L (ref 3.5–5.2)
Sodium: 143 mmol/L (ref 134–144)

## 2018-02-16 LAB — SPECIMEN STATUS REPORT

## 2018-02-16 LAB — PHOSPHORUS: Phosphorus: 3.4 mg/dL (ref 2.5–4.5)

## 2018-02-16 NOTE — Assessment & Plan Note (Signed)
Patient has been on tramadol with as needed dosing for lumbar back pain. The refills request for tramadol have been requested between appointments but today we had a chance to speak about the lower back pain. She describes a lumbar back pain which is fluctuating and bothers her on a daily basis. The pain is in the lumbar sacral area, it is not worse in the morning or at a particular time of the day and it does not keep her at night. She has no associated radiation of the pain or lower extremity weakness. She has not tried tylenol, topical analgesics or physical therapy for the pain. She does not have a particular reason for being averse to trying these other interventions. Some days the pain limits her ability to move around the house and go to the grocery store but when she takes tramadol she is able to go about her day.  [name] just got a new refill of tramadol prior to our appointment. Today we created a pain contract, I reviewed the database and it was appropriate, and I collected a urine drug screen, she had no substances to claim. I strongly encouraged [name] to try tylenol prior to taking the tylenol in the upcoming weeks and explained that we need to try the physical therapy referral at her follow up visit. We will also obtain lumbar xrays.

## 2018-02-16 NOTE — Assessment & Plan Note (Signed)
Patient is here for diabetes follow up, her diabetes remains very well controlled with hemoglobin A1c today 6.7. She is taking metformin 1000 mg daily. GFR of 45 limits our ability to start other oral medications.  - continue metformin 1000 mg daily  - ASCVD 28% currently prescribed moderate intensity simvastatin 20 mg daily and LDL last year was 58, unfortunately she has been unable to tolerate higher intensity statin such as crestor or lipitor in the past  - foot exam performed today

## 2018-02-16 NOTE — Assessment & Plan Note (Signed)
Blood pressure is not at goal of SBP < 130. Currently she is prescribed carvedilol 12.5 BID, hydralazine 25 mg BID, spironolactone 25 mg daily, and olmesartan 20 mg daily. In review of risk factors, she does not exercise but has been trying to start. She lives with her daughter and they mostly cook at home, her daughter rarely adds salt to food but they do eat fast food on occasion and have fried fish from outside of the home once weekly. She has had a sleep study in the past and was diagnosed with Mild OSA she was not able to tolerate CPAP and could not afford an oral appliance. She does not use NSAIDs. She does have slowly progressive CKD.  - increase olmesartan 20 to 40 mg daily - continue carvedilol 12.5 BID, hydralazine 25 mg BID, spironolactone 25 mg daily

## 2018-02-16 NOTE — Assessment & Plan Note (Signed)
Patient has CKD III 2/2 hypertension and diabetes. We are focused on minimizing her risk factors while monitoring for complications and progression of renal disease. Today BMP revealed stable CKD, she has no hypocalcemia, hyperphosphatemia or low bicarb.  - obtain Vitamin D and PTH at follow up  - monitor serum calcium and phos every 6-12 months  - monitor vitamin D and PTH yearly

## 2018-02-16 NOTE — Progress Notes (Signed)
Internal Medicine Clinic Attending  Case discussed with Dr. Blum at the time of the visit.  We reviewed the resident's history and exam and pertinent patient test results.  I agree with the assessment, diagnosis, and plan of care documented in the resident's note.  Alexander Raines, M.D., Ph.D.  

## 2018-02-21 LAB — TOXASSURE SELECT,+ANTIDEPR,UR

## 2018-02-24 ENCOUNTER — Telehealth: Payer: Self-pay | Admitting: Internal Medicine

## 2018-02-25 ENCOUNTER — Other Ambulatory Visit: Payer: Self-pay

## 2018-02-25 ENCOUNTER — Ambulatory Visit (INDEPENDENT_AMBULATORY_CARE_PROVIDER_SITE_OTHER): Payer: Medicare HMO | Admitting: Internal Medicine

## 2018-02-25 ENCOUNTER — Encounter: Payer: Self-pay | Admitting: Internal Medicine

## 2018-02-25 VITALS — BP 137/71 | HR 82 | Temp 97.9°F | Ht 68.0 in | Wt 233.8 lb

## 2018-02-25 DIAGNOSIS — Z79899 Other long term (current) drug therapy: Secondary | ICD-10-CM | POA: Diagnosis not present

## 2018-02-25 DIAGNOSIS — J45909 Unspecified asthma, uncomplicated: Secondary | ICD-10-CM | POA: Diagnosis not present

## 2018-02-25 DIAGNOSIS — N189 Chronic kidney disease, unspecified: Secondary | ICD-10-CM | POA: Diagnosis not present

## 2018-02-25 DIAGNOSIS — J069 Acute upper respiratory infection, unspecified: Secondary | ICD-10-CM | POA: Insufficient documentation

## 2018-02-25 DIAGNOSIS — B9789 Other viral agents as the cause of diseases classified elsewhere: Principal | ICD-10-CM

## 2018-02-25 MED ORDER — DICLOFENAC SODIUM 1 % TD GEL: 2.0000 g | Freq: Four times a day (QID) | TRANSDERMAL | 0 refills | Status: DC | PRN

## 2018-02-25 NOTE — Assessment & Plan Note (Signed)
Patient present with 6 days of cough symptoms. Her cough is productive of clear to yellow/green sputum. She denies fevers, congestion, watery eyes, sore throat. She endorses mild SOB and wheezing in the evening, which is relieved by her albuterol inhaler (prescribed for asthma), she has only had to use her nebulizer 2 times this week.  She endorses associated rib pain worse with deep inspiration and cough. This is likely costochondritis from repeated cough. Will provide topical NSAID as opposed to systemic given patient's CKD. - Voltaren Gel PRN - OTC cough and Cold medication PRN - Tea with honey for cough - Continue with Albuterol inhaler and nebulizer PRN - Return Precautions given

## 2018-02-25 NOTE — Progress Notes (Signed)
   CC: Viral URI with Cough   HPI:  [name] is a 67 y.o. F with PMHx listed below presenting for Viral URI with Cough. Please see the A&P for the status of the patient's chronic medical problems.   Past Medical History:  Diagnosis Date  . Arthritis    DJD, lumbar spondylosis  . Asthma   . Back pain   . Bilateral renal cysts   . Breast cancer ([place])    left breast invasive ductal ca in remission as of 10/07/11 s/p double mastectomy  . Bronchitis 03/10/2012  . Cataract   . Chemotherapy follow-up examination 03/06/2011  . Colon polyps   . Cough 03/06/2011  . Depression    seasonal melancholia  . E. coli UTI   . Edema extremities   . GERD (gastroesophageal reflux disease)   . Hyperlipidemia   . Hypertension   . Ileitis   . Kidney stones    non obstructive   . Myopathy    not statin related, cause unknown    Review of Systems:  Performed and all others negative.  Physical Exam:  Vitals:   02/25/18 1340  BP: 137/71  Pulse: 82  Temp: 97.9 F (36.6 C)  TempSrc: Oral  SpO2: 100%  Weight: 233 lb 12.8 oz (106.1 kg)  Height: 5\' 8"  (1.727 m)   Physical Exam  Constitutional: She appears well-developed and well-nourished. No distress.  Cardiovascular: Normal rate, regular rhythm, normal heart sounds and intact distal pulses.  Pulmonary/Chest: Effort normal. No respiratory distress.  Mild end expiratory wheezes  Abdominal: Soft. Bowel sounds are normal. She exhibits no distension. There is no tenderness.  Musculoskeletal: She exhibits no edema or deformity.  Skin: Skin is warm and dry.    Assessment & Plan:   See Encounters Tab for problem based charting.  Patient discussed with Dr. Lynnae January

## 2018-02-25 NOTE — Progress Notes (Signed)
Internal Medicine Clinic Attending  Case discussed with Dr. Melvin  at the time of the visit.  We reviewed the resident's history and exam and pertinent patient test results.  I agree with the assessment, diagnosis, and plan of care documented in the resident's note.  

## 2018-02-25 NOTE — Patient Instructions (Signed)
Thank you for allowing [place] to care for you  You symptoms are consistent with a viral illness  - Continue to use albuterol inhaler as needed and nebulizer if this does not provid erelief - We have prescribed Voltaren gel to apply to your ribs for pain relief  If you develop worsening fevers or sever symptoms please seek medical care

## 2018-03-08 ENCOUNTER — Other Ambulatory Visit: Payer: Self-pay

## 2018-03-08 DIAGNOSIS — J302 Other seasonal allergic rhinitis: Secondary | ICD-10-CM

## 2018-03-08 NOTE — Telephone Encounter (Signed)
fluticasone (FLONASE) 50 MCG/ACT nasal spray, refill request @  [place], [place] [phone] (Phone) [phone] (Fax)

## 2018-03-09 MED ORDER — FLUTICASONE PROPIONATE 50 MCG/ACT NA SUSP: 2.0000 | Freq: Every day | NASAL | 12 refills | Status: DC

## 2018-03-31 ENCOUNTER — Ambulatory Visit (INDEPENDENT_AMBULATORY_CARE_PROVIDER_SITE_OTHER): Payer: Medicare HMO | Admitting: *Deleted

## 2018-03-31 DIAGNOSIS — Z23 Encounter for immunization: Secondary | ICD-10-CM

## 2018-03-31 DIAGNOSIS — K760 Fatty (change of) liver, not elsewhere classified: Secondary | ICD-10-CM

## 2018-04-04 ENCOUNTER — Telehealth: Payer: Self-pay | Admitting: Internal Medicine

## 2018-04-04 NOTE — Telephone Encounter (Signed)
Pt missed called from [place], pls return call [phone]

## 2018-04-06 ENCOUNTER — Ambulatory Visit: Payer: Medicare HMO | Admitting: Adult Health

## 2018-04-08 ENCOUNTER — Ambulatory Visit (INDEPENDENT_AMBULATORY_CARE_PROVIDER_SITE_OTHER): Payer: Medicare HMO | Admitting: Adult Health

## 2018-04-08 ENCOUNTER — Encounter: Payer: Self-pay | Admitting: Adult Health

## 2018-04-08 VITALS — BP 134/76 | HR 83 | Ht 68.0 in | Wt 231.8 lb

## 2018-04-08 DIAGNOSIS — J310 Chronic rhinitis: Secondary | ICD-10-CM

## 2018-04-08 DIAGNOSIS — K219 Gastro-esophageal reflux disease without esophagitis: Secondary | ICD-10-CM | POA: Diagnosis not present

## 2018-04-08 DIAGNOSIS — J45991 Cough variant asthma: Secondary | ICD-10-CM | POA: Diagnosis not present

## 2018-04-08 LAB — NITRIC OXIDE: Nitric Oxide: 73

## 2018-04-08 MED ORDER — BECLOMETHASONE DIPROPIONATE 80 MCG/ACT IN AERS: 2.0000 | INHALATION_SPRAY | Freq: Two times a day (BID) | RESPIRATORY_TRACT | 3 refills | Status: DC

## 2018-04-08 MED ORDER — AZELASTINE HCL 0.1 % NA SOLN: 2.0000 | Freq: Every evening | NASAL | 5 refills | Status: DC | PRN

## 2018-04-08 MED ORDER — BECLOMETHASONE DIPROPIONATE 80 MCG/ACT IN AERS: 2.0000 | INHALATION_SPRAY | Freq: Two times a day (BID) | RESPIRATORY_TRACT | 0 refills | Status: DC

## 2018-04-08 NOTE — Addendum Note (Signed)
Addended by: Annie Paras D on: 04/08/2018 02:15 PM   Modules accepted: Orders

## 2018-04-08 NOTE — Progress Notes (Signed)
Patient seen in the office today and instructed on use of QVAR 80.  Patient expressed understanding and demonstrated technique.  Parke Poisson, [facility] 04/08/18

## 2018-04-08 NOTE — Patient Instructions (Addendum)
Change Claritin to Zyrtec 10mg  At bedtime  .  Saline nasal spray/rinse  Twice daily  .  Continue on Flonase 2 puffs daily  Add Astelin Nasal 2 puffs At bedtime  As needed   Begin QVAR 80 2 puffs Twice daily  , rinse after use.  Begin Pepcid 20mg  At bedtime  .  Follow up with Dr. Elsworth Soho  Or Slayde Brault NP in 4-6 weeks and As needed   Please contact office for sooner follow up if symptoms do not improve or worsen or seek emergency care

## 2018-04-08 NOTE — Addendum Note (Signed)
Addended by: Annie Paras D on: 04/08/2018 12:57 PM   Modules accepted: Orders

## 2018-04-08 NOTE — Assessment & Plan Note (Signed)
Possible silent GERD w/ nocturnal wheezing  GERD diet  Add pepcid At bedtime

## 2018-04-08 NOTE — Addendum Note (Signed)
Addended by: Parke Poisson E on: 04/08/2018 12:44 PM   Modules accepted: Orders

## 2018-04-08 NOTE — Addendum Note (Signed)
Addended by: Annie Paras D on: 04/08/2018 12:42 PM   Modules accepted: Orders

## 2018-04-08 NOTE — Progress Notes (Signed)
_0  ID: [name], female    DOB: [birth year], 68 y.o.   MRN: [id number]  Chief Complaint  Patient presents with  . Follow-up    Asthma     Referring provider: [name], MD  HPI: 68 year old female never smoker followed for cough variant asthma, chronic rhinitis and GERD Patient has a history of breast cancer status post chemo History of grade 1 diastolic dysfunction History of obstructive sleep apnea   Significant tests/ events  Methacholine challenge 11/2007 drop in smaller airways >>added singulair  spirometry 11/2007 wnl &11/2010 -no obstruction.  Head CT 9/12 Ethmoid sinus mucosal thickening and bubbly opacity in the left sphenoid  PSG 06/2012 showed mild OSA - she felt very claustrophobic,she could not afford the CPAP - hence abandoned   04/08/2018 Follow up : Asthma  Patient presents for a one-year follow-up.  Patient has cough variant asthma and chronic rhinitis . Says since last visit she has been doing okay except she has intermittent wheezing on/off. Worse at night .  She has rare albuterol use.  She remains on Flonase and Claritin daily. Still has nasal drainage, drippy nose.  No chest pain, orthopnea or increased edema.  Spirometry today shows normal lung function with FEV1 at 102%, ratio 79, FVC 101% mid flows normal. FENO today is elevated at 73ppb.   Allergies  Allergen Reactions  . Atorvastatin     REACTION: Nausea, abdominal pain, and reflux. Pt has used the medication in the past and experienced the same reaction before the medication was discontinued.  Marland Kitchen Hctz [Hydrochlorothiazide]     Blurry vision  . Norvasc [Amlodipine Besylate]     Blurry vision  . Rosuvastatin     REACTION: Itches    Immunization History  Administered Date(s) Administered  . Hepatitis A, Adult 09/27/2017  . Hepatitis B, adult 09/27/2017, 11/01/2017  . Hepb-cpg 03/31/2018  . Influenza Split 12/07/2017  . Influenza Whole 02/01/2006, 01/21/2007, 12/30/2007,  02/20/2009, 12/30/2009, 12/22/2010, 11/27/2011  . Influenza,inj,Quad PF,6+ Mos 12/07/2012, 11/29/2013, 12/17/2014, 11/11/2015, 01/04/2017  . Pneumococcal Conjugate-13 01/18/2014  . Pneumococcal Polysaccharide-23 02/21/2004, 03/23/2010, 05/29/2015  . Td 02/20/2002  . Tdap 06/14/2012    Past Medical History:  Diagnosis Date  . Arthritis    DJD, lumbar spondylosis  . Asthma   . Back pain   . Bilateral renal cysts   . Breast cancer ([place])    left breast invasive ductal ca in remission as of 10/07/11 s/p double mastectomy  . Bronchitis 03/10/2012  . Cataract   . Chemotherapy follow-up examination 03/06/2011  . Colon polyps   . Cough 03/06/2011  . Depression    seasonal melancholia  . E. coli UTI   . Edema extremities   . GERD (gastroesophageal reflux disease)   . Hyperlipidemia   . Hypertension   . Ileitis   . Kidney stones    non obstructive   . Myopathy    not statin related, cause unknown     Tobacco History: Social History   Tobacco Use  Smoking Status Never Smoker  Smokeless Tobacco Never Used   Counseling given: Not Answered   Outpatient Medications Prior to Visit  Medication Sig Dispense Refill  . ACCU-CHEK FASTCLIX LANCETS MISC Check blood sugar daily 102 each 3  . albuterol (PROVENTIL HFA;VENTOLIN HFA) 108 (90 Base) MCG/ACT inhaler INHALE 1 TO 2 PUFFS INTO THE LUNGS EVERY 6 HOURS AS NEEDED FOR WHEEZING OR SHORTNESS OF BREATH. 18 g 4  . aspirin 81 MG tablet Take 81 mg  by mouth daily.    . Blood Glucose Calibration (PRODIGY CONTROL SOLUTION) Low SOLN 1 each by In Vitro route every morning. 1 each 0  . Blood Glucose Monitoring Suppl (ACCU-CHEK AVIVA PLUS) w/Device KIT Check blood sugar daily as intructed 1 kit 0  . carvedilol (COREG) 25 MG tablet TAKE 1/2 TABLET TWICE DAILY WITH MEALS 90 tablet 3  . cholecalciferol (VITAMIN D) 1000 UNITS tablet Take 1,000 Units by mouth daily.    . diclofenac sodium (VOLTAREN) 1 % GEL Apply 2 g topically 4 (four) times daily as  needed. 1 Tube 0  . fluconazole (DIFLUCAN) 150 MG tablet Take 1 tablet (150 mg total) by mouth daily. 2 tablet 0  . fluticasone (FLONASE) 50 MCG/ACT nasal spray Place 2 sprays into both nostrils daily. 48 g 12  . glucose blood (ACCU-CHEK AVIVA PLUS) test strip Check blood sugar one time a day as instructed 100 each 3  . glucose blood test strip Use to check blood sugar 1 time daily diag code E11.9. Non insulin dependent 100 each 11  . hydrALAZINE (APRESOLINE) 25 MG tablet Take 1 tablet (25 mg total) by mouth 2 (two) times daily. 180 tablet 3  . loratadine (CLARITIN) 10 MG tablet Take 1 tablet (10 mg total) by mouth daily. 90 tablet 3  . LORazepam (ATIVAN) 1 MG tablet Take 1 tablet (1 mg total) by mouth as needed. This is a 30 day supply 30 tablet 5  . metFORMIN (GLUCOPHAGE-XR) 500 MG 24 hr tablet Take 2 tablets (1,000 mg total) by mouth daily with breakfast. 120 tablet 11  . Multiple Vitamin (MULTIVITAMIN) tablet Take 1 tablet by mouth daily.     Marland Kitchen olmesartan (BENICAR) 40 MG tablet Take 1 tablet (40 mg total) by mouth daily. 90 tablet 3  . PRODIGY SAFETY LANCETS 26G MISC 1 Units/day by Does not apply route daily. Use to check blood sugar 1 time daily. diag code E11.9. Non insulin dependent 100 each 11  . simvastatin (ZOCOR) 20 MG tablet Take 1 tablet (20 mg total) by mouth at bedtime. 90 tablet 3  . spironolactone (ALDACTONE) 25 MG tablet Take 1 tablet (25 mg total) by mouth daily. 90 tablet 3  . traMADol (ULTRAM) 50 MG tablet Take 1 tablet (50 mg total) by mouth as needed for severe pain. 30 tablet 0   No facility-administered medications prior to visit.      Review of Systems:   Constitutional:   No  weight loss, night sweats,  Fevers, chills, fatigue, or  lassitude.  HEENT:   No headaches,  Difficulty swallowing,  Tooth/dental problems, or  Sore throat,                No sneezing, itching, ear ache,  +nasal congestion, post nasal drip,   CV:  No chest pain,  Orthopnea, PND, swelling in  lower extremities, anasarca, dizziness, palpitations, syncope.   GI  No heartburn, indigestion, abdominal pain, nausea, vomiting, diarrhea, change in bowel habits, loss of appetite, bloody stools.   Resp:   No chest wall deformity  Skin: no rash or lesions.  GU: no dysuria, change in color of urine, no urgency or frequency.  No flank pain, no hematuria   MS:  No joint pain or swelling.  No decreased range of motion.  No back pain.    Physical Exam  BP 134/76 (BP Location: Right Arm, Cuff Size: Normal)   Pulse 83   Ht _0  (1.727 m)   Wt 231 lb  12.8 oz (105.1 kg)   LMP 12/31/1968   SpO2 98%   BMI 35.25 kg/m   GEN: A/Ox3; pleasant , NAD,    HEENT:  [place]/AT,  EACs-clear, TMs-wnl, NOSE-clear drainage , THROAT-clear, no lesions, no postnasal drip or exudate noted.   NECK:  Supple w/ fair ROM; no JVD; normal carotid impulses w/o bruits; no thyromegaly or nodules palpated; no lymphadenopathy.    RESP  Clear  P & A; w/o, wheezes/ rales/ or rhonchi. no accessory muscle use, no dullness to percussion  CARD:  RRR, no m/r/g, no peripheral edema, pulses intact, no cyanosis or clubbing.  GI:   Soft & nt; nml bowel sounds; no organomegaly or masses detected.   Musco: Warm bil, no deformities or joint swelling noted.   Neuro: alert, no focal deficits noted.    Skin: Warm, no lesions or rashes    Lab Results:  CBC  No results found.    No flowsheet data found.       Assessment & Plan:   Cough variant asthma Mild flare with rhinitis and ?GERD (nocturnal sx)  Elevated FENo c/w allergic component   Plan  Patient Instructions  Change Claritin to Zyrtec 11m At bedtime  .  Saline nasal spray/rinse  Twice daily  .  Continue on Flonase 2 puffs daily  Add Astelin Nasal 2 puffs At bedtime  As needed   Begin QVAR 80 2 puffs Twice daily  , rinse after use.  Begin Pepcid 251mAt bedtime  .  Follow up with Dr. AlElsworth SohoOr  NP in 4-6 weeks and As needed   Please contact  office for sooner follow up if symptoms do not improve or worsen or seek emergency care        Chronic rhinitis Flare   Plan Patient Instructions  Change Claritin to Zyrtec 1074mt bedtime  .  Saline nasal spray/rinse  Twice daily  .  Continue on Flonase 2 puffs daily  Add Astelin Nasal 2 puffs At bedtime  As needed   Begin QVAR 80 2 puffs Twice daily  , rinse after use.  Begin Pepcid 53m36m bedtime  .  Follow up with Dr. AlvaElsworth Soho  NP in 4-6 weeks and As needed   Please contact office for sooner follow up if symptoms do not improve or worsen or seek emergency care        GERD (gastroesophageal reflux disease) Possible silent GERD w/ nocturnal wheezing  GERD diet  Add pepcid At bedtime      TammRexene Edison 04/08/2018

## 2018-04-08 NOTE — Assessment & Plan Note (Signed)
Flare   Plan Patient Instructions  Change Claritin to Zyrtec 10mg  At bedtime  .  Saline nasal spray/rinse  Twice daily  .  Continue on Flonase 2 puffs daily  Add Astelin Nasal 2 puffs At bedtime  As needed   Begin QVAR 80 2 puffs Twice daily  , rinse after use.  Begin Pepcid 20mg  At bedtime  .  Follow up with Dr. Elsworth Soho  Or Terena Bohan NP in 4-6 weeks and As needed   Please contact office for sooner follow up if symptoms do not improve or worsen or seek emergency care

## 2018-04-08 NOTE — Assessment & Plan Note (Signed)
Mild flare with rhinitis and ?GERD (nocturnal sx)  Elevated FENo c/w allergic component   Plan  Patient Instructions  Change Claritin to Zyrtec 10mg  At bedtime  .  Saline nasal spray/rinse  Twice daily  .  Continue on Flonase 2 puffs daily  Add Astelin Nasal 2 puffs At bedtime  As needed   Begin QVAR 80 2 puffs Twice daily  , rinse after use.  Begin Pepcid 20mg  At bedtime  .  Follow up with Dr. Elsworth Soho  Or Parrett NP in 4-6 weeks and As needed   Please contact office for sooner follow up if symptoms do not improve or worsen or seek emergency care

## 2018-04-13 ENCOUNTER — Other Ambulatory Visit: Payer: Self-pay

## 2018-04-13 NOTE — Telephone Encounter (Signed)
Pt calls and states more nasal stuffiness recently, using her script flonase, bought some afrin and wants to know if she can use it also. She states loratadine is not helping, using flonase 2x daily

## 2018-04-13 NOTE — Telephone Encounter (Signed)
Pt would like to know if it's okay to use nasal spray. Please call pt back.

## 2018-04-14 NOTE — Telephone Encounter (Addendum)
Afrin is safe to use for about three days at a time but chronic use can lead to rebound worsening of the congestion.  Ive reviewed the AVS from her recent pulmonology office visit and they recommended switching Loratidine (Claritin) to zyrtec 10 mg at bedtime. She should also try using twice daily sinus irrigation or saline nasal spray. They also recommended as needed Astelin Nasal 2 puffs at bedtime. Please let [name] know that these changes that they recommended would be preferred over afrin.

## 2018-04-18 NOTE — Telephone Encounter (Signed)
Spoke to pt and gave her message, she is agreeable

## 2018-04-26 ENCOUNTER — Ambulatory Visit (INDEPENDENT_AMBULATORY_CARE_PROVIDER_SITE_OTHER): Payer: Medicare HMO | Admitting: Internal Medicine

## 2018-04-26 ENCOUNTER — Other Ambulatory Visit: Payer: Self-pay

## 2018-04-26 VITALS — BP 156/88 | HR 78 | Temp 98.6°F | Ht 68.0 in | Wt 237.2 lb

## 2018-04-26 DIAGNOSIS — M545 Low back pain: Secondary | ICD-10-CM

## 2018-04-26 DIAGNOSIS — C50412 Malignant neoplasm of upper-outer quadrant of left female breast: Secondary | ICD-10-CM

## 2018-04-26 DIAGNOSIS — E1122 Type 2 diabetes mellitus with diabetic chronic kidney disease: Secondary | ICD-10-CM

## 2018-04-26 DIAGNOSIS — Z6836 Body mass index (BMI) 36.0-36.9, adult: Secondary | ICD-10-CM

## 2018-04-26 DIAGNOSIS — E785 Hyperlipidemia, unspecified: Secondary | ICD-10-CM | POA: Diagnosis not present

## 2018-04-26 DIAGNOSIS — E1169 Type 2 diabetes mellitus with other specified complication: Secondary | ICD-10-CM

## 2018-04-26 DIAGNOSIS — Z79891 Long term (current) use of opiate analgesic: Secondary | ICD-10-CM

## 2018-04-26 DIAGNOSIS — Z170 Estrogen receptor positive status [ER+]: Secondary | ICD-10-CM

## 2018-04-26 DIAGNOSIS — I152 Hypertension secondary to endocrine disorders: Secondary | ICD-10-CM

## 2018-04-26 DIAGNOSIS — Z79899 Other long term (current) drug therapy: Secondary | ICD-10-CM

## 2018-04-26 DIAGNOSIS — E1159 Type 2 diabetes mellitus with other circulatory complications: Secondary | ICD-10-CM

## 2018-04-26 DIAGNOSIS — I110 Hypertensive heart disease with heart failure: Secondary | ICD-10-CM | POA: Diagnosis not present

## 2018-04-26 DIAGNOSIS — I129 Hypertensive chronic kidney disease with stage 1 through stage 4 chronic kidney disease, or unspecified chronic kidney disease: Secondary | ICD-10-CM

## 2018-04-26 DIAGNOSIS — G8929 Other chronic pain: Secondary | ICD-10-CM | POA: Diagnosis not present

## 2018-04-26 DIAGNOSIS — E119 Type 2 diabetes mellitus without complications: Secondary | ICD-10-CM | POA: Diagnosis not present

## 2018-04-26 DIAGNOSIS — G4733 Obstructive sleep apnea (adult) (pediatric): Secondary | ICD-10-CM

## 2018-04-26 DIAGNOSIS — I10 Essential (primary) hypertension: Secondary | ICD-10-CM

## 2018-04-26 DIAGNOSIS — N183 Chronic kidney disease, stage 3 unspecified: Secondary | ICD-10-CM

## 2018-04-26 DIAGNOSIS — Z853 Personal history of malignant neoplasm of breast: Secondary | ICD-10-CM

## 2018-04-26 DIAGNOSIS — Z9013 Acquired absence of bilateral breasts and nipples: Secondary | ICD-10-CM

## 2018-04-26 DIAGNOSIS — K760 Fatty (change of) liver, not elsewhere classified: Secondary | ICD-10-CM

## 2018-04-26 DIAGNOSIS — Z7984 Long term (current) use of oral hypoglycemic drugs: Secondary | ICD-10-CM

## 2018-04-26 DIAGNOSIS — I5032 Chronic diastolic (congestive) heart failure: Secondary | ICD-10-CM | POA: Diagnosis not present

## 2018-04-26 LAB — GLUCOSE, CAPILLARY: Glucose-Capillary: 70 mg/dL (ref 70–99)

## 2018-04-26 MED ORDER — HYDRALAZINE HCL 50 MG PO TABS: 50.0000 mg | ORAL_TABLET | Freq: Two times a day (BID) | ORAL | 2 refills | Status: DC

## 2018-04-26 NOTE — Progress Notes (Signed)
CC: follow up of diabetes   HPI:  MargaretMargaret Leach is a 68 y.o.   Past Medical History:  Diagnosis Date  . Arthritis    DJD, lumbar spondylosis  . Asthma   . Back pain   . Bilateral renal cysts   . Breast cancer ([place])    left breast invasive ductal ca in remission as of 10/07/11 s/p double mastectomy  . Bronchitis 03/10/2012  . Cataract   . Chemotherapy follow-up examination 03/06/2011  . Colon polyps   . Cough 03/06/2011  . Depression    seasonal melancholia  . E. coli UTI   . Edema extremities   . GERD (gastroesophageal reflux disease)   . Hyperlipidemia   . Hypertension   . Ileitis   . Kidney stones    non obstructive   . Myopathy    not statin related, cause unknown    Review of Systems:  Refer to history of present illness and assessment and plans for pertinent review of systems, all others reviewed and negative  Physical Exam:  Vitals:   04/26/18 1453  BP: (!) 171/86  Pulse: 78  Temp: 98.6 F (37 C)  TempSrc: Oral  SpO2: 99%  Weight: 237 lb 3.2 oz (107.6 kg)  Height: 5\' 8"  (1.727 m)   General: well appearing, no acute distress  Cardiac: regular rate and rhythm, no murmur, no peripheral edema  Pulm: normal work of breathing, no wheezes or rhonchi   Assessment & Plan:   Hypertension  CKD III B with anemia of CKD  Morbid obesity  OSA BP Readings from Last 3 Encounters:  04/26/18 (!) 171/86  04/08/18 134/76  02/25/18 137/71  Patient is here for follow up of blood pressure. Blood pressure is elevated above goal even on recheck and has been consistently above goal in the past. Comorbid conditions include DM, HTN, HLD, CKD, morbid obesity and untreated OSA. At last office visit blood pressure was elevated and olmesartan was increased. She is currently prescribed olmesartan 40 mg qd, carvedilol 12.5 BID, hydralazine 25 mg BID, and spironolactone 25 mg qd. Review of the chart shows that HCTZ and amlodipine were both stopped when [name]  experienced vision changes thought to be related to these. Has been on lasix in the past but this was stopped when it was found to have worsened her renal function. At last visit BMP revealed stable CKD III, she had no hypocalcemia, hyperphosphatemia, hyperkalemia or low bicarbonate.   - monitoring renal function every 3 months, if GFR falls below 30 we will have to stop using metformin, could consider transition to oral semaglutide ( oral, added benefit of weight loss) or actos ( oral, added benefit of helping with fatty liver but can cause weight loss) or a long acting sulfonylurea  - monitor serum calcium and phos every 6-12 months  - monitor Vitamin D and PTH yearly  - hemoglobin A1c has been consistently at goal, will monitor every 6 months  - continue to encourage exercise  - increase hydralazine 50 mg BID  - will ask Dr. Maudie Leach to complete a med rec for these antihypertensives   Hyperlipidemia  Denies any side effects related to statin therapy.  - continue simvastatin 20 mg daily   Breast cancer post bilateral mastectomy  History of invasive ductal carcinoma stage II of the left breast post bilateral mastectomies followed by 6 cycles of TCH followed by herceptin and letrozole. She undergoes yearly breast exam through her oncologist Dr. Lindi Leach. [name]  is due for follow up with Dr. Lindi Leach.  - encouraged to schedule follow up   Obstructive sleep apnea  Treated with CPAP in the past but she could non tolerate the mask and found the oral appliance to be too loud. I showed her pictures of the nasal pillow, she says that this would be something shes interested in trying. She mentions that at her last office visit with her pulmonologist they mentioned that her sleep apnea was mild and it would be okay to hold off on CPAP given all of her difficulties with the mask. Her daughter [name] confirms that the snoring has been improved and [name] denies daytime fatigue or headaches. I am concerned  the OSA may be driving some of her hypertension refractory to four medications but mentions the significant cost of renting the CPAP and would prefer to hold off on this therapy until it is absolutely necessary.   Long term use of opiate analgesic  [name] says she has not used any of the tramadol prescribed at her last office visit. She has had back pain but it has not been bad enough to necessitate the need of this. She has not yet tried tylenol therapy and declines PT referral today. She declines any refill of the tramadol today, she is aware that refills will only be issued during our continuity office visits in accordance with the pain medication contract.   Morbid obesity  Fatty liver disease  BMI stable at 34 with co morbidities including OSA, hypertension, chronic lower back pain, hld. LFTs and hepatic function remain within the normal limits, reassuring that this is not NASH.  - continue to encourage exercise  - hep a and b non immune, I will coordinate with the front desk to have [name] scheduled for nursing visits for these.   See Encounters Tab for problem based charting.  Patient discussed with Dr. Angelia Leach

## 2018-04-26 NOTE — Patient Instructions (Addendum)
Thank you for coming to the clinic today. It was a pleasure to see you.   Please stop by the lab on your way out   Increase the hydralazine dose to 50 mg twice daily   Dr. Maudie Mercury will call you to check over the blood pressure medications   FOLLOW-UP INSTRUCTIONS When: 3 months with Dr. Hetty Ely For: Follow up of your hypertension and chronic pain  What to bring: all of your medication bottles   Please call the internal medicine center clinic if you have any questions or concerns, we may be able to help and keep you from a long and expensive emergency room wait. Our clinic and after hours phone number is [phone], the best time to call is Monday through Friday 9 am to 4 pm but there is always someone available 24/7 if you have an emergency. If you need medication refills please notify your pharmacy one week in advance and they will send [place] a request.    This is what the pulmonologist recommended at your last visit with them:   Saline nasal spray/rinse  Twice daily (neti pot) Continue on Flonase 2 puffs daily  Change Claritin/Loratadine to Zyrtec 10mg  At bedtime  .  Add Astelin Nasal 2 puffs At bedtime  As needed    Begin QVAR 80 2 puffs Twice daily  , rinse after use.  Begin Pepcid 20mg  At bedtime  Be sure to start using these now so that you can tell them if it is working at your follow up visit.    BUFFERED ISOTONIC SALINE NASAL IRRIGATION  The Benefits:  1. When you irrigate, the isotonic saline (salt water) acts as a solvent and washes the mucus crusts and other debris from your nose.  2. This decongests and improves the airflow into your nose. The sinus passages begin to open.   Over the counter:  I recommend a Squirt Bottle form of saline to provide an adequate volume of irrigation to clear your sinuses.   Home Recipe:  1. Choose a 1-quart glass jar that is thoroughly cleansed.  2. Fill with sterile or distilled water, or you can boil water from the tap.  3. Add  1 to 2 heaping teaspoons of "pickling/canning/sea" salt (NOT table salt as it contains a large number of additives). This salt is available at the grocery store in the food canning section.  4. Mix ingredients together and store at room temperature. Discard after one week. If you find this solution too strong, you may decrease the amount of salt added to 1 to 1  teaspoons. With children it is often best to start with a milder solution and advance slowly. Irrigate with 240 ml (8 oz) twice daily.  The Instructions:  You should plan to irrigate your nose with buffered isotonic saline 2 times per day. Many people prefer to warm the solution slightly in the microwave - but be sure that the solution is NOT HOT. Stand over the sink (some do this in the shower) and squirt the solution into each side of your nose, keeping your mouth open. This allows you to spit the saltwater out of your mouth. It will not harm you if you swallow a little.  If you have been told to use a nasal steroid such as Flonase, Nasonex, or Nasacort, you should always use isortonic saline solution first, then use your nasal steroid product. The nasal steroid is much more effective when sprayed onto clean nasal membranes and the steroid medicine will  reach deeper into the nose.  Most people experience a little burning sensation the first few times they use a isotonic saline solution, but this usually goes away within a few days.

## 2018-04-27 LAB — BMP8+ANION GAP
Anion Gap: 19 mmol/L — ABNORMAL HIGH (ref 10.0–18.0)
BUN/Creatinine Ratio: 8 — ABNORMAL LOW (ref 12–28)
BUN: 10 mg/dL (ref 8–27)
CO2: 20 mmol/L (ref 20–29)
Calcium: 9.4 mg/dL (ref 8.7–10.3)
Chloride: 103 mmol/L (ref 96–106)
Creatinine, Ser: 1.27 mg/dL — ABNORMAL HIGH (ref 0.57–1.00)
GFR calc Af Amer: 50 mL/min/{1.73_m2} — ABNORMAL LOW (ref >59)
GFR calc non Af Amer: 43 mL/min/{1.73_m2} — ABNORMAL LOW (ref >59)
Glucose: 121 mg/dL — ABNORMAL HIGH (ref 65–99)
Potassium: 3.8 mmol/L (ref 3.5–5.2)
Sodium: 142 mmol/L (ref 134–144)

## 2018-04-28 ENCOUNTER — Telehealth: Payer: Self-pay | Admitting: Pulmonary Disease

## 2018-04-28 ENCOUNTER — Encounter: Payer: Self-pay | Admitting: Internal Medicine

## 2018-04-28 NOTE — Assessment & Plan Note (Signed)
Treated with CPAP in the past but she could non tolerate the mask and found the oral appliance to be too loud. I showed her pictures of the nasal pillow, she says that this would be something shes interested in trying. She mentions that at her last office visit with her pulmonologist they mentioned that her sleep apnea was mild and it would be okay to hold off on CPAP given all of her difficulties with the mask. Her daughter Tressia Miners confirms that the snoring has been improved and Ms. Sanson denies daytime fatigue or headaches. I am concerned the OSA may be driving some of her hypertension refractory to four medications but mentions the significant cost of renting the CPAP and would prefer to hold off on this therapy until it is absolutely necessary.

## 2018-04-28 NOTE — Assessment & Plan Note (Signed)
BP Readings from Last 3 Encounters:  04/26/18 (!) 171/86  04/08/18 134/76  02/25/18 137/71  Patient is here for follow up of blood pressure. Blood pressure is elevated above goal even on recheck and has been consistently above goal in the past. Comorbid conditions include DM, HTN, HLD, CKD, morbid obesity and untreated OSA. At last office visit blood pressure was elevated and olmesartan was increased. She is currently prescribed olmesartan 40 mg qd, carvedilol 12.5 BID, hydralazine 25 mg BID, and spironolactone 25 mg qd. Review of the chart shows that HCTZ and amlodipine were both stopped when Ms. Turley experienced vision changes thought to be related to these. Has been on lasix in the past but this was stopped when it was found to have worsened her renal function. At last visit BMP revealed stable CKD III, she had no hypocalcemia, hyperphosphatemia, hyperkalemia or low bicarbonate.   - monitoring renal function every 3 months, if GFR falls below 30 we will have to stop using metformin  - monitor serum calcium and phos every 6-12 months  - monitor Vitamin D and PTH yearly  - hemoglobin A1c has been consistently at goal, will monitor every 6 months  - continue to encourage exercise  - increase hydralazine 50 mg BID  - will ask Dr. Maudie Mercury to complete a med rec for these antihypertensives

## 2018-04-28 NOTE — Telephone Encounter (Signed)
May have sinus infection , have her return for OV , can see her tomorrow for exam  Please contact office for sooner follow up if symptoms do not improve or worsen or seek emergency care

## 2018-04-28 NOTE — Assessment & Plan Note (Signed)
BMI stable at 36 with co morbidities including OSA, hypertension, chronic lower back pain, hld.  - continue to encourage exercise

## 2018-04-28 NOTE — Telephone Encounter (Signed)
Called pt and advised message from the provider. Pt understood and verbalized understanding. Nothing further is needed.   Scheduled appt for tomorrow 04/29/2018  for 10:15am with TP

## 2018-04-28 NOTE — Assessment & Plan Note (Signed)
History of invasive ductal carcinoma stage II of the left breast post bilateral mastectomies followed by 6 cycles of TCH followed by herceptin and letrozole. She undergoes yearly breast exam through her oncologist Dr. Lindi Adie. [name] is due for follow up with Dr. Lindi Adie.  - encouraged to schedule follow up

## 2018-04-28 NOTE — Assessment & Plan Note (Signed)
[name] says she has not used any of the tramadol prescribed at her last office visit. She has had back pain but it has not been bad enough to necessitate the need of this. She has not yet tried tylenol therapy and declines PT referral today. She declines any refill of the tramadol today, she is aware that refills will only be issued during our continuity office visits in accordance with the pain medication contract.

## 2018-04-28 NOTE — Telephone Encounter (Signed)
Primary Pulmonologist: Elsworth Soho Last office visit and with whom: TP, 04/08/18 What do we see them for (pulmonary problems): asthma Last OV assessment/plan:   Instructions      Return in about 4 weeks (around 05/06/2018) for Follow up with Dr. Elsworth Soho.  Change Claritin to Zyrtec 10mg  At bedtime  .  Saline nasal spray/rinse  Twice daily  .  Continue on Flonase 2 puffs daily  Add Astelin Nasal 2 puffs At bedtime  As needed   Begin QVAR 80 2 puffs Twice daily  , rinse after use.  Begin Pepcid 20mg  At bedtime  .  Follow up with Dr. Elsworth Soho  Or Parrett NP in 4-6 weeks and As needed   Please contact office for sooner follow up if symptoms do not improve or worsen or seek emergency care         Was appointment offered to patient (explain)?  Yes, declined due to weather   Reason for call:  Called and spoke with patient. She is having increase head congestion.  She has had some SHOB, but she thinks that is because her head is so stopped up. She has taken Claritin, flonase, astelin, has used her albuterol, and Qvar inhaler, with no relief.  She stated that she has no taste since taking nose sprays. She denies any fever.  Tammy P, NP, Please advise

## 2018-04-28 NOTE — Assessment & Plan Note (Addendum)
At last visit BMP revealed stable CKD III, she had no hypocalcemia, hyperphosphatemia, hyperkalemia or low bicarbonate.   - monitoring renal function every 3 months, if GFR falls below 30 we will have to stop using metformin  - monitor serum calcium and phos every 6-12 months  - monitor Vitamin D and PTH yearly  - hemoglobin A1c has been consistently at goal, will monitor every 6 months  - increase hydralazine 50 mg BID, continue other antihypertensives as mentioned above

## 2018-04-28 NOTE — Assessment & Plan Note (Signed)
Denies any side effects related to statin therapy.  - continue simvastatin 20 mg daily

## 2018-04-28 NOTE — Assessment & Plan Note (Addendum)
BMI stable at 36 with co morbidities including OSA, hypertension, chronic lower back pain, hld. LFTs and hepatic function remain within the normal limits, reassuring that this is not NASH.  - continue to encourage exercise  - hep a and b non immune, I will coordinate with the front desk to have [name] scheduled for nursing visits for these.

## 2018-04-28 NOTE — Assessment & Plan Note (Signed)
-   monitoring renal function every 3 months, if GFR falls below 30 we will have to stop using metformin, could consider transition to oral semaglutide ( oral, added benefit of weight loss) or actos ( oral, added benefit of helping with fatty liver but can cause weight loss) or a long acting sulfonylurea

## 2018-04-29 ENCOUNTER — Encounter: Payer: Self-pay | Admitting: Adult Health

## 2018-04-29 ENCOUNTER — Ambulatory Visit (INDEPENDENT_AMBULATORY_CARE_PROVIDER_SITE_OTHER): Payer: Medicare HMO | Admitting: Adult Health

## 2018-04-29 ENCOUNTER — Ambulatory Visit (INDEPENDENT_AMBULATORY_CARE_PROVIDER_SITE_OTHER)
Admission: RE | Admit: 2018-04-29 | Discharge: 2018-04-29 | Disposition: A | Discharge status: Home / Self Care | Payer: Medicare HMO | Source: Ambulatory Visit | Attending: Adult Health | Admitting: Adult Health

## 2018-04-29 DIAGNOSIS — J45991 Cough variant asthma: Secondary | ICD-10-CM | POA: Diagnosis not present

## 2018-04-29 DIAGNOSIS — R05 Cough: Secondary | ICD-10-CM | POA: Diagnosis not present

## 2018-04-29 DIAGNOSIS — R0602 Shortness of breath: Secondary | ICD-10-CM | POA: Diagnosis not present

## 2018-04-29 NOTE — Assessment & Plan Note (Addendum)
Recent mild flare , no perceived benefit to changes made with QVAR , Zyrtec and Astelin  She is clinically improved will resume previous regimen  Check CXR today .   Plan  Patient Instructions  May take Claritin at  Bedtime As needed  Drainage  Saline nasal spray/rinse  Twice daily  .  Continue on Flonase 2 puffs daily As needed nasal congestion .  May stop QVAR   May use  Pepcid 20mg  At bedtime  As needed  For heartburn or nighttime cough .  Chest xray today .  Follow up with Dr. Elsworth Soho  Or Malyia Moro NP in 2- 3 months and As needed   Please contact office for sooner follow up if symptoms do not improve or worsen or seek emergency care

## 2018-04-29 NOTE — Progress Notes (Signed)
Internal Medicine Clinic Attending  Case discussed with Dr. Blum at the time of the visit.  We reviewed the resident's history and exam and pertinent patient test results.  I agree with the assessment, diagnosis, and plan of care documented in the resident's note. 

## 2018-04-29 NOTE — Patient Instructions (Addendum)
May take Claritin at  Bedtime As needed  Drainage  Saline nasal spray/rinse  Twice daily  .  Continue on Flonase 2 puffs daily As needed nasal congestion .  May stop QVAR   May use  Pepcid 20mg  At bedtime  As needed  For heartburn or nighttime cough .  Chest xray today .  Follow up with Dr. Elsworth Soho  Or Kynan Peasley NP in 2- 3 months and As needed   Please contact office for sooner follow up if symptoms do not improve or worsen or seek emergency care

## 2018-04-29 NOTE — Progress Notes (Signed)
$'@Patient'X$  ID: [name], female    DOB: [birth year], 68 y.o.   MRN: [id number]  Chief Complaint  Patient presents with  . Acute Visit    Asthma    Referring provider: [name], MD  HPI: 68 year old female never smoker followed for cough variant asthma, chronic rhinitis and GERD Patient has a history of breast cancer status post chemo History of grade 1 diastolic dysfunction History of obstructive sleep apnea   Significant tests/ events  Methacholine challenge 11/2007 drop in smaller airways >>added singulair  spirometry 11/2007 wnl &11/2010 -no obstruction.  Head CT 9/12 Ethmoid sinus mucosal thickening and bubbly opacity in the left sphenoid  PSG 06/2012 showed mild OSA - she felt very claustrophobic,she could not afford the CPAP - hence abandoned  Spirometry today shows normal lung function with FEV1 at 102%, ratio 79, FVC 101% mid flows normal. FENO today is elevated at 73ppb.   04/29/2018 Acute OV : Asthma  Patient returns for persistent symptoms.  Was seen 2 weeks ago  for a asthma follow-up.  Patient was doing well except for some intermittent wheezing.  Exhaled nitric oxide testing was elevated at 73.  Spirometry showed normal lung function with no airflow obstruction.  She was felt to have a possible rhinitis trigger.  She was changed from Claritin to Zyrtec, Astelin was added and Qvar and Pepcid was added.  Patient says after starting this that she felt worse.  Felt like that it made her sinus congestion and breathing worse.  Patient stopped these a few days ago and is feeling much better.  Patient says she does have some nasal stuffiness especially on the right side.  Has no discolored mucus no sinus pain pressure or gum or teeth pain.  Says that her wheezing has resolved totally. She denies any chest pain orthopnea PND or increased leg swelling.  Allergies  Allergen Reactions  . Atorvastatin     REACTION: Nausea, abdominal pain, and reflux. Pt has used the  medication in the past and experienced the same reaction before the medication was discontinued.  Marland Kitchen Hctz [Hydrochlorothiazide]     Blurry vision  . Norvasc [Amlodipine Besylate]     Blurry vision  . Rosuvastatin     REACTION: Itches    Immunization History  Administered Date(s) Administered  . Hepatitis A, Adult 09/27/2017  . Hepatitis B, adult 09/27/2017, 11/01/2017  . Hepb-cpg 03/31/2018  . Influenza Split 12/07/2017  . Influenza Whole 02/01/2006, 01/21/2007, 12/30/2007, 02/20/2009, 12/30/2009, 12/22/2010, 11/27/2011  . Influenza,inj,Quad PF,6+ Mos 12/07/2012, 11/29/2013, 12/17/2014, 11/11/2015, 01/04/2017  . Pneumococcal Conjugate-13 01/18/2014  . Pneumococcal Polysaccharide-23 02/21/2004, 03/23/2010, 05/29/2015  . Td 02/20/2002  . Tdap 06/14/2012    Past Medical History:  Diagnosis Date  . Arthritis    DJD, lumbar spondylosis  . Asthma   . Back pain   . Bilateral renal cysts   . Breast cancer ([place])    left breast invasive ductal ca in remission as of 10/07/11 s/p double mastectomy  . Bronchitis 03/10/2012  . Cataract   . Chemotherapy follow-up examination 03/06/2011  . Colon polyps   . Cough 03/06/2011  . Depression    seasonal melancholia  . E. coli UTI   . Edema extremities   . GERD (gastroesophageal reflux disease)   . Hyperlipidemia   . Hypertension   . Ileitis   . Kidney stones    non obstructive   . Myopathy    not statin related, cause unknown     Tobacco  History: Social History   Tobacco Use  Smoking Status Never Smoker  Smokeless Tobacco Never Used   Counseling given: Not Answered   Outpatient Medications Prior to Visit  Medication Sig Dispense Refill  . ACCU-CHEK FASTCLIX LANCETS MISC Check blood sugar daily 102 each 3  . albuterol (PROVENTIL HFA;VENTOLIN HFA) 108 (90 Base) MCG/ACT inhaler INHALE 1 TO 2 PUFFS INTO THE LUNGS EVERY 6 HOURS AS NEEDED FOR WHEEZING OR SHORTNESS OF BREATH. 18 g 4  . aspirin 81 MG tablet Take 81 mg by mouth  daily.    Marland Kitchen azelastine (ASTELIN) 0.1 % nasal spray Place 2 sprays into both nostrils at bedtime as needed for rhinitis. 30 mL 5  . beclomethasone (QVAR) 80 MCG/ACT inhaler Inhale 2 puffs into the lungs 2 (two) times daily. 1 Inhaler 0  . Blood Glucose Calibration (PRODIGY CONTROL SOLUTION) Low SOLN 1 each by In Vitro route every morning. 1 each 0  . Blood Glucose Monitoring Suppl (ACCU-CHEK AVIVA PLUS) w/Device KIT Check blood sugar daily as intructed 1 kit 0  . carvedilol (COREG) 25 MG tablet TAKE 1/2 TABLET TWICE DAILY WITH MEALS 90 tablet 3  . cholecalciferol (VITAMIN D) 1000 UNITS tablet Take 1,000 Units by mouth daily.    . diclofenac sodium (VOLTAREN) 1 % GEL Apply 2 g topically 4 (four) times daily as needed. 1 Tube 0  . fluticasone (FLONASE) 50 MCG/ACT nasal spray Place 2 sprays into both nostrils daily. 48 g 12  . glucose blood (ACCU-CHEK AVIVA PLUS) test strip Check blood sugar one time a day as instructed 100 each 3  . glucose blood test strip Use to check blood sugar 1 time daily diag code E11.9. Non insulin dependent 100 each 11  . hydrALAZINE (APRESOLINE) 50 MG tablet Take 1 tablet (50 mg total) by mouth 2 (two) times daily. 60 tablet 2  . loratadine (CLARITIN) 10 MG tablet Take 1 tablet (10 mg total) by mouth daily. 90 tablet 3  . LORazepam (ATIVAN) 1 MG tablet Take 1 tablet (1 mg total) by mouth as needed. This is a 30 day supply 30 tablet 5  . metFORMIN (GLUCOPHAGE-XR) 500 MG 24 hr tablet Take 2 tablets (1,000 mg total) by mouth daily with breakfast. 120 tablet 11  . Multiple Vitamin (MULTIVITAMIN) tablet Take 1 tablet by mouth daily.     Marland Kitchen olmesartan (BENICAR) 40 MG tablet Take 1 tablet (40 mg total) by mouth daily. 90 tablet 3  . PRODIGY SAFETY LANCETS 26G MISC 1 Units/day by Does not apply route daily. Use to check blood sugar 1 time daily. diag code E11.9. Non insulin dependent 100 each 11  . simvastatin (ZOCOR) 20 MG tablet Take 1 tablet (20 mg total) by mouth at bedtime. 90  tablet 3  . spironolactone (ALDACTONE) 25 MG tablet Take 1 tablet (25 mg total) by mouth daily. 90 tablet 3  . traMADol (ULTRAM) 50 MG tablet Take 1 tablet (50 mg total) by mouth as needed for severe pain. 30 tablet 0  . beclomethasone (QVAR) 80 MCG/ACT inhaler Inhale 2 puffs into the lungs 2 (two) times daily. 1 Inhaler 3  . fluconazole (DIFLUCAN) 150 MG tablet Take 1 tablet (150 mg total) by mouth daily. 2 tablet 0   No facility-administered medications prior to visit.      Review of Systems:   Constitutional:   No  weight loss, night sweats,  Fevers, chills, fatigue, or  lassitude.  HEENT:   No headaches,  Difficulty swallowing,  Tooth/dental problems,  or  Sore throat,                No sneezing, itching, ear ache,  +nasal congestion, post nasal drip,   CV:  No chest pain,  Orthopnea, PND, swelling in lower extremities, anasarca, dizziness, palpitations, syncope.   GI  No heartburn, indigestion, abdominal pain, nausea, vomiting, diarrhea, change in bowel habits, loss of appetite, bloody stools.   Resp:  No excess mucus, no productive cough,  No non-productive cough,  No coughing up of blood.  No change in color of mucus.  No wheezing.  No chest wall deformity  Skin: no rash or lesions.  GU: no dysuria, change in color of urine, no urgency or frequency.  No flank pain, no hematuria   MS:  No joint pain or swelling.  No decreased range of motion.  No back pain.    Physical Exam  BP 122/84 (BP Location: Left Arm, Cuff Size: Normal)   Pulse 73   Temp 98.5 F (36.9 C) (Oral)   Ht '5\' 8"'$  (1.727 m)   Wt 234 lb 9.6 oz (106.4 kg)   LMP 12/31/1968   SpO2 98%   BMI 35.67 kg/m   GEN: A/Ox3; pleasant , NAD,  obese   HEENT:  [place]/AT,  EACs-clear, TMs-wnl, NOSE-clear, THROAT-clear, no lesions, no postnasal drip or exudate noted.   NECK:  Supple w/ fair ROM; no JVD; normal carotid impulses w/o bruits; no thyromegaly or nodules palpated; no lymphadenopathy.    RESP  Clear  P & A;  w/o, wheezes/ rales/ or rhonchi. no accessory muscle use, no dullness to percussion  CARD:  RRR, no m/r/g, no peripheral edema, pulses intact, no cyanosis or clubbing.  GI:   Soft & nt; nml bowel sounds; no organomegaly or masses detected.   Musco: Warm bil, no deformities or joint swelling noted.   Neuro: alert, no focal deficits noted.    Skin: Warm, no lesions or rashes    Lab Results:  CBC  BMET  BNP  ProBNP   Imaging: Dg Chest 2 View  Result Date: 04/29/2018 CLINICAL DATA:  Shortness of breath with intermittent cough. History of asthma and hypertension. EXAM: CHEST - 2 VIEW COMPARISON:  06/04/2017 FINDINGS: The heart size and mediastinal contours are within normal limits. Both lungs are clear. No pleural effusion or pneumothorax. The visualized skeletal structures are unremarkable. IMPRESSION: No active cardiopulmonary disease. Electronically Signed   By: Lajean Manes M.D.   On: 04/29/2018 15:25      No flowsheet data found.       Assessment & Plan:   Cough variant asthma Recent mild flare , no perceived benefit to changes made with QVAR , Zyrtec and Astelin  She is clinically improved will resume previous regimen  Check CXR today .   Plan  Patient Instructions  May take Claritin at  Bedtime As needed  Drainage  Saline nasal spray/rinse  Twice daily  .  Continue on Flonase 2 puffs daily As needed nasal congestion .  May stop QVAR   May use  Pepcid '20mg'$  At bedtime  As needed  For heartburn or nighttime cough .  Chest xray today .  Follow up with Dr. Elsworth Soho  Or Camille Dragan NP in 2- 3 months and As needed   Please contact office for sooner follow up if symptoms do not improve or worsen or seek emergency care           Rexene Edison, NP 04/29/2018

## 2018-05-02 ENCOUNTER — Telehealth: Payer: Self-pay | Admitting: Adult Health

## 2018-05-02 MED ORDER — AMOXICILLIN-POT CLAVULANATE 875-125 MG PO TABS: 1.0000 | ORAL_TABLET | Freq: Two times a day (BID) | ORAL | 0 refills | Status: DC

## 2018-05-02 NOTE — Telephone Encounter (Signed)
Patient complains over the last 3 days that she has had increased sinus pressure blowing out yellow to green mucus teeth pain.  Patient is requesting antibiotic.  She denies any wheezing chest pain orthopnea leg swelling nausea vomiting.  Says she has a good appetite and is drinking plenty of fluids.  She has been using saline nasal spray and Flonase but seems that it is not helping at all.  She denies any fever or body aches.  Reviewed all her allergies.  She is to begin Augmentin 875 mg twice daily for 10 days.  Prescription will be sent to her pharmacy.  Patient is advised that if symptoms are not improving she is to contact our office immediately.  Please contact office for sooner follow up if symptoms do not improve or worsen or seek emergency care

## 2018-05-02 NOTE — Telephone Encounter (Signed)
Primary Pulmonologist: Dr. Elsworth Soho Last office visit and with whom: 04/29/2018 with Tammy for an acute visit What do we see them for (pulmonary problems): Cough and OSA Last OV assessment/plan:  Patient Instructions  May take Claritin at  Bedtime As needed  Drainage  Saline nasal spray/rinse  Twice daily  .  Continue on Flonase 2 puffs daily As needed nasal congestion .  May stop QVAR   May use  Pepcid 20mg  At bedtime  As needed  For heartburn or nighttime cough .  Chest xray today .  Follow up with Dr. Elsworth Soho  Or Parrett NP in 2- 3 months and As needed   Please contact office for sooner follow up if symptoms do not improve or worsen or seek emergency care   Was appointment offered to patient (explain)?  Yes, but declined due to having been seen on 04/29/2018.   Reason for call:  Spoke with pt. States that she is not feeling any better since her OV with Tammy on Friday. Reports headache, teeth pain, sinus drainage, sinus pressure, sinus congestion, shortness of breath, wheezing and coughing. Cough is non productive at this time. Denies chest tightness or fever/chills/sweats. Pt has not taken any OTC medications to help with symptoms. She is taking her medications as prescribed. She would like something sent in, "Please tell Tammy that I need relief now."  Tammy - please advise as you were the last one to recently see the pt. Thanks!

## 2018-05-05 ENCOUNTER — Ambulatory Visit (INDEPENDENT_AMBULATORY_CARE_PROVIDER_SITE_OTHER): Payer: Medicare HMO | Admitting: Nurse Practitioner

## 2018-05-05 ENCOUNTER — Encounter: Payer: Self-pay | Admitting: Nurse Practitioner

## 2018-05-05 VITALS — BP 160/90 | HR 112 | Temp 98.3°F | Ht 68.0 in | Wt 231.9 lb

## 2018-05-05 DIAGNOSIS — J019 Acute sinusitis, unspecified: Secondary | ICD-10-CM | POA: Insufficient documentation

## 2018-05-05 MED ORDER — PREDNISONE 10 MG PO TABS: ORAL_TABLET | ORAL | 0 refills | Status: DC

## 2018-05-05 NOTE — Patient Instructions (Addendum)
Please finish Augmentin as prescribed by PCP Will order prednisone taper for severe sinus pressure Please use astelin 2 sprays daily Neo synephrine nasal neb given in office today  General instructions: Hydrate  Drink enough water to keep your pee (urine) clear or pale yellow. Rest  Rest as much as possible.  Sleep with your head raised (elevated).  Make sure to get enough sleep each night. General instructions  Put a warm, moist washcloth on your face 3-4 times a day or as told by your doctor. This will help with discomfort.  Wash your hands often with soap and water. If there is no soap and water, use hand sanitizer.  Do not smoke. Avoid being around people who are smoking (secondhand smoke). Call the office if:  You have a fever.  Your symptoms get worse.  Your symptoms do not get better within 10 days.  Follow up with Dr. Elsworth Soho as already scheduled or sooner if needed

## 2018-05-05 NOTE — Assessment & Plan Note (Addendum)
Patient presents today with sinus congestion pressure and pain.  Complains of more sinus congestion on the right side.  She was already started on Augmentin this week by PCP.  She was given Neo-Synephrine nasal neb in office today and stated significant relief from neb treatment.  We will treat for acute sinusitis today.  Patient Instructions  Please finish Augmentin as prescribed by PCP Will order prednisone taper for severe sinus pressure Please use astelin 2 sprays daily Neo synephrine nasal neb given in office today  General instructions: Hydrate  Drink enough water to keep your pee (urine) clear or pale yellow. Rest  Rest as much as possible.  Sleep with your head raised (elevated).  Make sure to get enough sleep each night. General instructions  Put a warm, moist washcloth on your face 3-4 times a day or as told by your doctor. This will help with discomfort.  Wash your hands often with soap and water. If there is no soap and water, use hand sanitizer.  Do not smoke. Avoid being around people who are smoking (secondhand smoke). Call the office if:  You have a fever.  Your symptoms get worse.  Your symptoms do not get better within 10 days.  Follow up with Dr. Elsworth Soho as already scheduled or sooner if needed

## 2018-05-05 NOTE — Progress Notes (Signed)
_0  ID: [name], female    DOB: [birth year], 68 y.o.   MRN: [id number]  Chief Complaint  Patient presents with  . Acute Visit    nasal congestion    Referring provider: [name], MD  HPI 68 year old female never smoker followed for cough variant asthma, chronic rhinitis and GERD by Dr. Elsworth Soho.  Patient has a history of breast cancer status post chemo, history of grade 1 diastolic dysfunction, and history of obstructive sleep apnea.   Tests/Significant events  Methacholine challenge 11/2007 drop in smaller airways >>added singulair   Spirometry 11/2007 wnl &11/2010 -no obstruction.  Head CT 9/12 Ethmoid sinus mucosal thickening and bubbly opacity in the left sphenoid  PSG 06/2012 showed mild OSA - she felt very claustrophobic,she could not afford the CPAP - hence abandoned  Spirometry 04/29/18 -  shownormal lung function with FEV1 at 102%, ratio 79, FVC 101% mid flows normal.  Lab Results  Component Value Date   NITRICOXIDE 73 04/08/2018   04/29/18 - Last OV with Tammy Parrett Plan: Cough variant asthma Recent mild flare , no perceived benefit to changes made with QVAR , Zyrtec and Astelin  She is clinically improved will resume previous regimen  Check CXR today  Patient Instructions  May take Claritin at  Bedtime As needed  Drainage  Saline nasal spray/rinse  Twice daily  .  Continue on Flonase 2 puffs daily As needed nasal congestion .  May stop QVAR   May use  Pepcid 68m At bedtime  As needed  For heartburn or nighttime cough .  Chest xray today .  Follow up with Dr. AElsworth Soho Or Parrett NP in 2- 3 months and As needed   Please contact office for sooner follow up if symptoms do not improve or worsen or seek emergency care    OV 05/05/18 - Acute nasal congestion Patient presents today with sinus congestion pressure and pain.  She states that the nasal congestion is much worse on her right side.  She was seen by her PCP on Monday (05/02/18) of this week and  was started on Augmentin.  She states that the Augmentin has not helped yet.  She states that she has tried Flonase, Astelin, and Claritin.  She states that none of these have given her any relief.  She denies any recent fever.  She denies any shortness of breath, chest pain, or edema.    Allergies  Allergen Reactions  . Atorvastatin     REACTION: Nausea, abdominal pain, and reflux. Pt has used the medication in the past and experienced the same reaction before the medication was discontinued.  .Marland KitchenHctz [Hydrochlorothiazide]     Blurry vision  . Norvasc [Amlodipine Besylate]     Blurry vision  . Rosuvastatin     REACTION: Itches    Immunization History  Administered Date(s) Administered  . Hepatitis A, Adult 09/27/2017  . Hepatitis B, adult 09/27/2017, 11/01/2017  . Hepb-cpg 03/31/2018  . Influenza Split 12/07/2017  . Influenza Whole 02/01/2006, 01/21/2007, 12/30/2007, 02/20/2009, 12/30/2009, 12/22/2010, 11/27/2011  . Influenza,inj,Quad PF,6+ Mos 12/07/2012, 11/29/2013, 12/17/2014, 11/11/2015, 01/04/2017  . Pneumococcal Conjugate-13 01/18/2014  . Pneumococcal Polysaccharide-23 02/21/2004, 03/23/2010, 05/29/2015  . Td 02/20/2002  . Tdap 06/14/2012    Past Medical History:  Diagnosis Date  . Arthritis    DJD, lumbar spondylosis  . Asthma   . Back pain   . Bilateral renal cysts   . [place]    left breast invasive ductal ca  in remission as of 10/07/11 s/p double mastectomy  . Bronchitis 03/10/2012  . Cataract   . Chemotherapy follow-up examination 03/06/2011  . Colon polyps   . Cough 03/06/2011  . Depression    seasonal melancholia  . E. coli UTI   . Edema extremities   . GERD (gastroesophageal reflux disease)   . Hyperlipidemia   . Hypertension   . Ileitis   . Kidney stones    non obstructive   . Myopathy    not statin related, cause unknown     Tobacco History: Social History   Tobacco Use  Smoking Status Never Smoker  Smokeless Tobacco Never Used    Counseling given: Yes   Outpatient Encounter Medications as of 05/05/2018  Medication Sig  . ACCU-CHEK FASTCLIX LANCETS MISC Check blood sugar daily  . albuterol (PROVENTIL HFA;VENTOLIN HFA) 108 (90 Base) MCG/ACT inhaler INHALE 1 TO 2 PUFFS INTO THE LUNGS EVERY 6 HOURS AS NEEDED FOR WHEEZING OR SHORTNESS OF BREATH.  Marland Kitchen amoxicillin-clavulanate (AUGMENTIN) 875-125 MG tablet Take 1 tablet by mouth 2 (two) times daily for 10 days.  Marland Kitchen aspirin 81 MG tablet Take 81 mg by mouth daily.  Marland Kitchen azelastine (ASTELIN) 0.1 % nasal spray Place 2 sprays into both nostrils at bedtime as needed for rhinitis.  Marland Kitchen beclomethasone (QVAR) 80 MCG/ACT inhaler Inhale 2 puffs into the lungs 2 (two) times daily.  . Blood Glucose Calibration (PRODIGY CONTROL SOLUTION) Low SOLN 1 each by In Vitro route every morning.  . Blood Glucose Monitoring Suppl (ACCU-CHEK AVIVA PLUS) w/Device KIT Check blood sugar daily as intructed  . carvedilol (COREG) 25 MG tablet TAKE 1/2 TABLET TWICE DAILY WITH MEALS  . cholecalciferol (VITAMIN D) 1000 UNITS tablet Take 1,000 Units by mouth daily.  . diclofenac sodium (VOLTAREN) 1 % GEL Apply 2 g topically 4 (four) times daily as needed.  . fluticasone (FLONASE) 50 MCG/ACT nasal spray Place 2 sprays into both nostrils daily.  Marland Kitchen glucose blood (ACCU-CHEK AVIVA PLUS) test strip Check blood sugar one time a day as instructed  . glucose blood test strip Use to check blood sugar 1 time daily diag code E11.9. Non insulin dependent  . hydrALAZINE (APRESOLINE) 50 MG tablet Take 1 tablet (50 mg total) by mouth 2 (two) times daily.  Marland Kitchen loratadine (CLARITIN) 10 MG tablet Take 1 tablet (10 mg total) by mouth daily.  Marland Kitchen LORazepam (ATIVAN) 1 MG tablet Take 1 tablet (1 mg total) by mouth as needed. This is a 30 day supply  . metFORMIN (GLUCOPHAGE-XR) 500 MG 24 hr tablet Take 2 tablets (1,000 mg total) by mouth daily with breakfast.  . Multiple Vitamin (MULTIVITAMIN) tablet Take 1 tablet by mouth daily.   Marland Kitchen  olmesartan (BENICAR) 40 MG tablet Take 1 tablet (40 mg total) by mouth daily.  Marland Kitchen PRODIGY SAFETY LANCETS 26G MISC 1 Units/day by Does not apply route daily. Use to check blood sugar 1 time daily. diag code E11.9. Non insulin dependent  . simvastatin (ZOCOR) 20 MG tablet Take 1 tablet (20 mg total) by mouth at bedtime.  Marland Kitchen spironolactone (ALDACTONE) 25 MG tablet Take 1 tablet (25 mg total) by mouth daily.  . traMADol (ULTRAM) 50 MG tablet Take 1 tablet (50 mg total) by mouth as needed for severe pain.  . predniSONE (DELTASONE) 10 MG tablet Take 4 tabs for 2 days, then 3 tabs for 2 days, then 2 tabs for 2 days, then 1 tab for 2 days, then stop   No facility-administered encounter medications  on file as of 05/05/2018.      Review of Systems  Review of Systems  Constitutional: Negative.   HENT: Positive for congestion, postnasal drip, sinus pressure and sinus pain.   Respiratory: Negative for cough, shortness of breath and wheezing.   Cardiovascular: Negative.  Negative for chest pain, palpitations and leg swelling.  Gastrointestinal: Negative.   Allergic/Immunologic: Negative.   Neurological: Negative.   Psychiatric/Behavioral: Negative.        Physical Exam  BP (!) 160/90   Pulse (!) 112   Temp 98.3 F (36.8 C) (Oral)   Ht _0  (1.727 m)   Wt 231 lb 13.9 oz (105.2 kg)   LMP 12/31/1968   SpO2 100%   BMI 35.26 kg/m   Wt Readings from Last 5 Encounters:  05/05/18 231 lb 13.9 oz (105.2 kg)  04/29/18 234 lb 9.6 oz (106.4 kg)  04/26/18 237 lb 3.2 oz (107.6 kg)  04/08/18 231 lb 12.8 oz (105.1 kg)  02/25/18 233 lb 12.8 oz (106.1 kg)     Physical Exam Vitals signs and nursing note reviewed.  Constitutional:      General: She is not in acute distress.    Appearance: She is well-developed.  HENT:     Nose: Congestion and rhinorrhea present.     Right Turbinates: Swollen.     Right Sinus: Maxillary sinus tenderness and frontal sinus tenderness present.     Left Sinus:  Maxillary sinus tenderness and frontal sinus tenderness present.  Cardiovascular:     Rate and Rhythm: Normal rate and regular rhythm.  Pulmonary:     Effort: Pulmonary effort is normal.     Breath sounds: Normal breath sounds.  Neurological:     Mental Status: She is alert and oriented to person, place, and time.      Imaging: Dg Chest 2 View  Result Date: 04/29/2018 CLINICAL DATA:  Shortness of breath with intermittent cough. History of asthma and hypertension. EXAM: CHEST - 2 VIEW COMPARISON:  06/04/2017 FINDINGS: The heart size and mediastinal contours are within normal limits. Both lungs are clear. No pleural effusion or pneumothorax. The visualized skeletal structures are unremarkable. IMPRESSION: No active cardiopulmonary disease. Electronically Signed   By: Lajean Manes M.D.   On: 04/29/2018 15:25     Assessment & Plan:   Acute non-recurrent sinusitis Patient presents today with sinus congestion pressure and pain.  Complains of more sinus congestion on the right side.  She was already started on Augmentin this week by PCP.  She was given Neo-Synephrine nasal neb in office today and stated significant relief from neb treatment.  We will treat for acute sinusitis today.  Patient Instructions  Please finish Augmentin as prescribed by PCP Will order prednisone taper for severe sinus pressure Please use astelin 2 sprays daily Neo synephrine nasal neb given in office today  General instructions: Hydrate  Drink enough water to keep your pee (urine) clear or pale yellow. Rest  Rest as much as possible.  Sleep with your head raised (elevated).  Make sure to get enough sleep each night. General instructions  Put a warm, moist washcloth on your face 3-4 times a day or as told by your doctor. This will help with discomfort.  Wash your hands often with soap and water. If there is no soap and water, use hand sanitizer.  Do not smoke. Avoid being around people who are smoking  (secondhand smoke). Call the office if:  You have a fever.  Your symptoms get  worse.  Your symptoms do not get better within 10 days.  Follow up with Dr. Elsworth Soho as already scheduled or sooner if needed       Fenton Foy, NP 05/05/2018

## 2018-05-09 ENCOUNTER — Ambulatory Visit (INDEPENDENT_AMBULATORY_CARE_PROVIDER_SITE_OTHER): Payer: Medicare HMO | Admitting: Pharmacist

## 2018-05-09 ENCOUNTER — Ambulatory Visit: Payer: Medicare HMO

## 2018-05-09 ENCOUNTER — Other Ambulatory Visit: Payer: Self-pay | Admitting: *Deleted

## 2018-05-09 ENCOUNTER — Other Ambulatory Visit (INDEPENDENT_AMBULATORY_CARE_PROVIDER_SITE_OTHER): Payer: Medicare HMO

## 2018-05-09 VITALS — BP 170/100

## 2018-05-09 DIAGNOSIS — I10 Essential (primary) hypertension: Secondary | ICD-10-CM

## 2018-05-09 DIAGNOSIS — N183 Chronic kidney disease, stage 3 unspecified: Secondary | ICD-10-CM

## 2018-05-09 DIAGNOSIS — B373 Candidiasis of vulva and vagina: Secondary | ICD-10-CM

## 2018-05-09 DIAGNOSIS — B3731 Acute candidiasis of vulva and vagina: Secondary | ICD-10-CM

## 2018-05-09 DIAGNOSIS — R0789 Other chest pain: Secondary | ICD-10-CM

## 2018-05-09 MED ORDER — FLUCONAZOLE 150 MG PO TABS: 150.0000 mg | ORAL_TABLET | Freq: Once | ORAL | 0 refills | Status: DC

## 2018-05-10 ENCOUNTER — Ambulatory Visit (INDEPENDENT_AMBULATORY_CARE_PROVIDER_SITE_OTHER): Payer: Medicare HMO | Admitting: Internal Medicine

## 2018-05-10 ENCOUNTER — Other Ambulatory Visit: Payer: Self-pay

## 2018-05-10 VITALS — BP 141/79 | HR 98 | Temp 99.1°F | Ht 68.0 in | Wt 230.1 lb

## 2018-05-10 DIAGNOSIS — M79672 Pain in left foot: Secondary | ICD-10-CM

## 2018-05-10 DIAGNOSIS — G589 Mononeuropathy, unspecified: Secondary | ICD-10-CM

## 2018-05-10 LAB — PTH, INTACT AND CALCIUM
Calcium: 9.6 mg/dL (ref 8.7–10.3)
PTH: 35 pg/mL (ref 15–65)

## 2018-05-10 LAB — VITAMIN D 25 HYDROXY (VIT D DEFICIENCY, FRACTURES): Vit D, 25-Hydroxy: 35 ng/mL (ref 30.0–100.0)

## 2018-05-10 MED ORDER — OLMESARTAN MEDOXOMIL 40 MG PO TABS: 40.0000 mg | ORAL_TABLET | Freq: Every evening | ORAL | 3 refills | Status: DC

## 2018-05-10 NOTE — Addendum Note (Signed)
Addended by: Forde Dandy on: 05/10/2018 06:15 PM   Modules accepted: Level of Service

## 2018-05-10 NOTE — Progress Notes (Addendum)
S: [name] is a 68 y.o. female reports to clinical pharmacist appointment for hypertension management.  Allergies  Allergen Reactions  . Atorvastatin     REACTION: Nausea, abdominal pain, and reflux. Pt has used the medication in the past and experienced the same reaction before the medication was discontinued.  Marland Kitchen Hctz [Hydrochlorothiazide]     Blurry vision  . Norvasc [Amlodipine Besylate]     Blurry vision  . Rosuvastatin     REACTION: Itches   Medication Sig  ACCU-CHEK FASTCLIX LANCETS MISC Check blood sugar daily  albuterol (PROVENTIL HFA;VENTOLIN HFA) 108 (90 Base) MCG/ACT inhaler INHALE 1 TO 2 PUFFS INTO THE LUNGS EVERY 6 HOURS AS NEEDED FOR WHEEZING OR SHORTNESS OF BREATH.  amoxicillin-clavulanate (AUGMENTIN) 875-125 MG tablet Take 1 tablet by mouth 2 (two) times daily for 10 days.  aspirin 81 MG tablet Take 81 mg by mouth daily.  azelastine (ASTELIN) 0.1 % nasal spray Place 2 sprays into both nostrils at bedtime as needed for rhinitis.  beclomethasone (QVAR) 80 MCG/ACT inhaler Inhale 2 puffs into the lungs 2 (two) times daily.  Blood Glucose Calibration (PRODIGY CONTROL SOLUTION) Low SOLN 1 each by In Vitro route every morning.  Blood Glucose Monitoring Suppl (ACCU-CHEK AVIVA PLUS) w/Device KIT Check blood sugar daily as intructed  carvedilol (COREG) 25 MG tablet TAKE 1/2 TABLET TWICE DAILY WITH MEALS  cholecalciferol (VITAMIN D) 1000 UNITS tablet Take 1,000 Units by mouth daily.  diclofenac sodium (VOLTAREN) 1 % GEL Apply 2 g topically 4 (four) times daily as needed.  fluticasone (FLONASE) 50 MCG/ACT nasal spray Place 2 sprays into both nostrils daily.  glucose blood (ACCU-CHEK AVIVA PLUS) test strip Check blood sugar one time a day as instructed  glucose blood test strip Use to check blood sugar 1 time daily diag code E11.9. Non insulin dependent  hydrALAZINE (APRESOLINE) 50 MG tablet Take 1 tablet (50 mg total) by mouth 2 (two) times daily.  loratadine (CLARITIN) 10  MG tablet Take 1 tablet (10 mg total) by mouth daily.  LORazepam (ATIVAN) 1 MG tablet Take 1 tablet (1 mg total) by mouth as needed. This is a 30 day supply  metFORMIN (GLUCOPHAGE-XR) 500 MG 24 hr tablet Take 2 tablets (1,000 mg total) by mouth daily with breakfast.  Multiple Vitamin (MULTIVITAMIN) tablet Take 1 tablet by mouth daily.   olmesartan (BENICAR) 40 MG tablet Take 1 tablet (40 mg total) by mouth daily.  predniSONE (DELTASONE) 10 MG tablet Take 4 tabs for 2 days, then 3 tabs for 2 days, then 2 tabs for 2 days, then 1 tab for 2 days, then stop  PRODIGY SAFETY LANCETS 26G MISC 1 Units/day by Does not apply route daily. Use to check blood sugar 1 time daily. diag code E11.9. Non insulin dependent  simvastatin (ZOCOR) 20 MG tablet Take 1 tablet (20 mg total) by mouth at bedtime.  spironolactone (ALDACTONE) 25 MG tablet Take 1 tablet (25 mg total) by mouth daily.  traMADol (ULTRAM) 50 MG tablet Take 1 tablet (50 mg total) by mouth as needed for severe pain.   Past Medical History:  Diagnosis Date  . Arthritis    DJD, lumbar spondylosis  . Asthma   . Back pain   . Bilateral renal cysts   . Breast cancer ([place])    left breast invasive ductal ca in remission as of 10/07/11 s/p double mastectomy  . Bronchitis 03/10/2012  . Cataract   . Chemotherapy follow-up examination 03/06/2011  . Colon polyps   .  Cough 03/06/2011  . Depression    seasonal melancholia  . E. coli UTI   . Edema extremities   . GERD (gastroesophageal reflux disease)   . Hyperlipidemia   . Hypertension   . Ileitis   . Kidney stones    non obstructive   . Myopathy    not statin related, cause unknown    Social History   Socioeconomic History  . Marital status: Single    Spouse name: Not on file  . Number of children: Not on file  . Years of education: Not on file  . Highest education level: Not on file  Occupational History  . Not on file  Social Needs  . Financial resource strain: Not on file  . Food  insecurity:    Worry: Not on file    Inability: Not on file  . Transportation needs:    Medical: Not on file    Non-medical: Not on file  Tobacco Use  . Smoking status: Never Smoker  . Smokeless tobacco: Never Used  Substance and Sexual Activity  . Alcohol use: No    Alcohol/week: 0.0 standard drinks  . Drug use: No  . Sexual activity: Not on file  Lifestyle  . Physical activity:    Days per week: Not on file    Minutes per session: Not on file  . Stress: Not on file  Relationships  . Social connections:    Talks on phone: Not on file    Gets together: Not on file    Attends religious service: Not on file    Active member of club or organization: Not on file    Attends meetings of clubs or organizations: Not on file    Relationship status: Not on file  Other Topics Concern  . Not on file  Social History Narrative   Single, 2 kids   Never smoked   No alcohol use   No drugs   Laid off from [employer] after working there for 14 years.    Family History  Problem Relation Age of Onset  . Heart disease Mother 14       CAD  . Breast cancer Mother   . Cancer Mother 55       Breast cancer, mother  . Cancer Father 83       colon cancer dx'ed age 64  . Diabetes Father   . Coronary artery disease Other   . Multiple sclerosis Daughter   . Diabetes Brother   . Diabetes Brother    O:    Component Value Date/Time   CHOL 193 08/05/2016 1059   HDL 49 08/05/2016 1059   LDLCALC 78 08/05/2016 1059   TRIG 329 (H) 08/05/2016 1059   GLUCOSE 121 (H) 04/26/2018 1631   GLUCOSE 125 08/03/2017 1138   GLUCOSE 143 (H) 07/31/2014 1006   GLUCOSE 134 (H) 04/25/2012 1045   HGBA1C 6.7 (A) 02/15/2018 1607   HGBA1C 6.2 (H) 12/22/2010 0545   NA 142 04/26/2018 1631   NA 144 07/31/2014 1006   K 3.8 04/26/2018 1631   K 4.7 07/31/2014 1006   CL 103 04/26/2018 1631   CL 103 04/25/2012 1045   CO2 20 04/26/2018 1631   CO2 23 07/31/2014 1006   BUN 10 04/26/2018 1631   BUN 39.1  (H) 07/31/2014 1006   CREATININE 1.27 (H) 04/26/2018 1631   CREATININE 1.72 (H) 08/03/2017 1138   CREATININE 1.10 09/26/2014 1420   CREATININE 2.2 (H) 07/31/2014 1006  CALCIUM 9.6 05/09/2018 1515   CALCIUM 9.6 07/31/2014 1006   GFRNONAA 43 (L) 04/26/2018 1631   GFRNONAA 29 (L) 08/03/2017 1138   GFRNONAA 53 (L) 09/26/2014 1420   GFRAA 50 (L) 04/26/2018 1631   GFRAA 34 (L) 08/03/2017 1138   GFRAA 61 09/26/2014 1420   AST 33 08/03/2017 1138   AST 17 07/31/2014 1006   ALT 40 08/03/2017 1138   ALT 20 07/31/2014 1006   WBC 5.9 08/03/2017 1138   WBC 6.2 07/31/2014 1006   WBC 6.5 10/12/2013 1204   HGB 10.6 (L) 08/03/2017 1138   HGB 10.9 (L) 03/30/2017 1523   HGB 10.2 (L) 07/31/2014 1006   HCT 32.4 (L) 08/03/2017 1138   HCT 33.3 (L) 03/30/2017 1523   HCT 30.6 (L) 07/31/2014 1006   PLT 202 08/03/2017 1138   PLT 231 03/30/2017 1523   TSH 2.217 02/24/2010 1946   Ht Readings from Last 2 Encounters:  05/10/18 '5\' 8"'$  (1.727 m)  05/05/18 '5\' 8"'$  (1.727 m)   Wt Readings from Last 2 Encounters:  05/10/18 230 lb 1.6 oz (104.4 kg)  05/05/18 231 lb 13.9 oz (105.2 kg)   There is no height or weight on file to calculate BMI. BP Readings from Last 3 Encounters:  05/10/18 (!) 141/79  05/10/18 (!) 170/100  05/05/18 (!) 160/90   A/P: Patient reports for HTN management support per PCP consult. She was treated last week for sinusitis and is still currently taking prednisone (3 more days of therapy remaining). She states that the most prominent symptom was congestion, which has significantly improved with prednisone. She was also prescribed amoxicillin/clavulanate, but only completed 6 days of therapy due to developing a vaginal yeast infection (she reports trying over-the-counter treatment including miconazole but is still experiencing symptoms similar to occurrence in June 2019). Discussed with attending physician and prescribed fluconazole 150 mg x 1 dose.  No changes to HTN medications today due  to acute symptoms/prednisone potentially effecting BP. We did review general BP reduction strategies (minimizing caffeine and salt intake, etc) and home BP monitoring. Pharmacy records do indicate consistent medication refill history and patient correctly describes how she takes each medication. Patient was advised to keep a log of BP results and bring with her to clinic follow up. Also advised patient to take olmesartan at night.   Of note, patient does qualify for [facility] DM steroid initiative, but reports home BG are in the 100s, only 3 days of prednisone remaining, and she would prefer to contact clinic if any issues/if home BG > 200s.  An after visit summary was provided and patient advised to follow up if any changes in condition or questions regarding medications arise. The patient verbalized understanding of information provided by repeating back concepts discussed. Patient does have a PCP appointment in May and would prefer to follow up with PCP for further HTN management.

## 2018-05-10 NOTE — Patient Instructions (Addendum)
[name],   I believe the pain in your left foot is being because by compression of the nerve by the flip-flop shoes you wear.  I recommend you change this type of shoe to a more comfortable one.  I expect the pain to improve after a few days.  You can use Tylenol for the pain at night to help you sleep.  Call us if you have any questions or concerns.  -Dr. Frederico Hamman

## 2018-05-11 ENCOUNTER — Encounter: Payer: Self-pay | Admitting: Internal Medicine

## 2018-05-11 DIAGNOSIS — G589 Mononeuropathy, unspecified: Secondary | ICD-10-CM | POA: Insufficient documentation

## 2018-05-11 NOTE — Assessment & Plan Note (Signed)
[name] presents with a 3-day history of intermittent shooting pain over the dorsum of the left foot between the first and second toes.  This pain is worse at night and wakes her up from sleep at times.  She has not tried anything for it.  Denies joint pain, fevers, chills, and tenderness.  She does use flip-flops constantly at home and reports these feel tight towards the end of the day due to feet swelling.  She has trace edema on her left foot but otherwise it appears grossly normal.  No tenderness to palpation over affected area or over any of the joints.  No signs of infections noted and no sensory or motor deficits.  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  I recommended wearing wider shoes to decrease pressure in the area. Will continue to monitor.

## 2018-05-11 NOTE — Progress Notes (Signed)
   CC: L foot pain   HPI:  [name] is a 68 y.o. year-old female with PMH listed below who presents to clinic for L foot pain. Please see problem based assessment and plan for further details.   Past Medical History:  Diagnosis Date  . Arthritis    DJD, lumbar spondylosis  . Asthma   . Back pain   . Bilateral renal cysts   . Breast cancer ([place])    left breast invasive ductal ca in remission as of 10/07/11 s/p double mastectomy  . Bronchitis 03/10/2012  . Cataract   . Chemotherapy follow-up examination 03/06/2011  . Colon polyps   . Cough 03/06/2011  . Depression    seasonal melancholia  . E. coli UTI   . Edema extremities   . GERD (gastroesophageal reflux disease)   . Hyperlipidemia   . Hypertension   . Ileitis   . Kidney stones    non obstructive   . Myopathy    not statin related, cause unknown    Review of Systems:   Review of Systems  Constitutional: Negative for chills and fever.  Musculoskeletal:       L foot pain     Physical Exam: Vitals:   05/10/18 1556  BP: (!) 141/79  Pulse: 98  Temp: 99.1 F (37.3 C)  TempSrc: Oral  SpO2: 98%  Weight: 230 lb 1.6 oz (104.4 kg)  Height: 5\' 8"  (1.727 m)    General: pleasant female, well-appearing, in NAD  L foot: trace edema noted. Otherwise foot appears grossly normal without rashes, lesions, warmth, or erythema. No TTP over affected area. No sensory or motor deficits noted.   Assessment & Plan:   See Encounters Tab for problem based charting.  Patient discussed with Dr. Angelia Mould

## 2018-05-12 NOTE — Progress Notes (Signed)
Internal Medicine Clinic Attending  Case discussed with Dr. Santos-Sanchez at the time of the visit.  We reviewed the resident's history and exam and pertinent patient test results.  I agree with the assessment, diagnosis, and plan of care documented in the resident's note.    

## 2018-05-16 ENCOUNTER — Ambulatory Visit: Payer: Medicare HMO | Admitting: Pulmonary Disease

## 2018-05-17 ENCOUNTER — Encounter: Payer: Medicare HMO | Admitting: Internal Medicine

## 2018-06-17 ENCOUNTER — Telehealth: Payer: Self-pay | Admitting: Pulmonary Disease

## 2018-06-17 MED ORDER — PREDNISONE 10 MG PO TABS: ORAL_TABLET | ORAL | 0 refills | Status: DC

## 2018-06-17 NOTE — Telephone Encounter (Signed)
Spoke with pt and advised of Dr Bari Mantis recommendations. Pt verbalized understanding.  Rx sent to pharmacy.  Nothing further needed.

## 2018-06-17 NOTE — Telephone Encounter (Signed)
Called patient to cancel her appt on 07/28/18 but she stated that she was not feeling well. She has the same symptoms that she had when she saw Tonya on 05/05/18. Her sinus infection is back. Increased facial pressure and swelling. She is not able to sleep unless she is sitting up. Denies any fever. This has been going on for the past 3 days.   She is requesting another prednisone taper.   Pharmacy is [facility] on [address].   RA, please advise. Thanks!

## 2018-06-17 NOTE — Telephone Encounter (Signed)
Prednisone 10 mg tabs  Take 2 tabs daily with food x 5ds, then 1 tab daily with food x 5ds then STOP   Tele visit if no better by then

## 2018-07-11 ENCOUNTER — Telehealth: Payer: Self-pay | Admitting: Adult Health

## 2018-07-11 NOTE — Telephone Encounter (Signed)
Rescheduled 5/19 to 5/18 and changed to webex per sch msg. Called patient, no answer. Left msg explaining changes.

## 2018-07-20 DIAGNOSIS — C50911 Malignant neoplasm of unspecified site of right female breast: Secondary | ICD-10-CM | POA: Diagnosis not present

## 2018-07-20 DIAGNOSIS — C50912 Malignant neoplasm of unspecified site of left female breast: Secondary | ICD-10-CM | POA: Diagnosis not present

## 2018-07-21 ENCOUNTER — Telehealth: Payer: Self-pay | Admitting: Internal Medicine

## 2018-07-21 NOTE — Telephone Encounter (Signed)
Called pt - c/o stiffness and swelling of knee. Explained tele health visit; pt agreeable.  [facility] tele health appt schedule for tomorrow @ 1834 PM.

## 2018-07-21 NOTE — Telephone Encounter (Signed)
Please schedule telehealth appointment for her.

## 2018-07-21 NOTE — Telephone Encounter (Signed)
Pt with right swelling in knee with pain.  Medication not working and patient would like to know what else she can use. Please call back.

## 2018-07-22 ENCOUNTER — Ambulatory Visit (INDEPENDENT_AMBULATORY_CARE_PROVIDER_SITE_OTHER): Payer: Medicare HMO | Admitting: Internal Medicine

## 2018-07-22 ENCOUNTER — Other Ambulatory Visit: Payer: Self-pay

## 2018-07-22 DIAGNOSIS — Z79899 Other long term (current) drug therapy: Secondary | ICD-10-CM | POA: Diagnosis not present

## 2018-07-22 DIAGNOSIS — M1711 Unilateral primary osteoarthritis, right knee: Secondary | ICD-10-CM

## 2018-07-22 DIAGNOSIS — Z79891 Long term (current) use of opiate analgesic: Secondary | ICD-10-CM

## 2018-07-22 DIAGNOSIS — M25561 Pain in right knee: Secondary | ICD-10-CM

## 2018-07-22 MED ORDER — DICLOFENAC SODIUM 1 % TD GEL: 4.0000 g | Freq: Four times a day (QID) | TRANSDERMAL | 2 refills | Status: DC | PRN

## 2018-07-22 NOTE — Progress Notes (Signed)
   This is a telephone encounter between [name] and [name] on 07/22/2018 for right knee pain. The visit was conducted with the patient located at home and [place] at [facility]. The patient's identity was confirmed using their DOB and current address. The patient has consented to being evaluated through a telephone encounter and understands the associated risks (an examination cannot be done and the patient may need to come in for an appointment) / benefits (allows the patient to remain at home, decreasing exposure to coronavirus). I personally spent 10 minutes on medical discussion.   CC: Right knee pain and swelling for 1 week.  HPI:  [name] is a 68 y.o. with past medical history as listed below was given a call due to her complaint of right knee pain and swelling for 1 week. Patient has developed similar symptoms in the past which were treated with steroid injection.  Last injection was in October 2019.  Per patient her injection normally last more than a year but this time her pain came back earlier.  She was also complaining of intermittent tingling and numbness of right foot.  She denies any history of gout.  She denies any erythema or hyperthermia of right knee.  She denies any fever or chills.  She was using tramadol for pain which do help.  Past Medical History:  Diagnosis Date  . Arthritis    DJD, lumbar spondylosis  . Asthma   . Back pain   . Bilateral renal cysts   . Breast cancer ([place])    left breast invasive ductal ca in remission as of 10/07/11 s/p double mastectomy  . Bronchitis 03/10/2012  . Cataract   . Chemotherapy follow-up examination 03/06/2011  . Colon polyps   . Cough 03/06/2011  . Depression    seasonal melancholia  . E. coli UTI   . Edema extremities   . GERD (gastroesophageal reflux disease)   . Hyperlipidemia   . Hypertension   . Ileitis   . Kidney stones    non obstructive   . Myopathy    not statin related, cause unknown     Review of Systems: Negative except mentioned in HPI  Physical Exam:  There were no vitals filed for this visit. Physical exam was not performed at it was a tele-visit.  Assessment & Plan:   See Encounters Tab for problem based charting.  Patient discussed with Dr. Dareen Piano.

## 2018-07-22 NOTE — Assessment & Plan Note (Signed)
Most likely another acute exacerbation of her chronic arthritis.  Gave her a prescription of Voltaren gel. Advised to continue using tramadol. Can also try using knee brace.  If her symptoms does not improve she will come on Tuesday afternoon for knee injection.

## 2018-07-25 NOTE — Progress Notes (Signed)
Internal Medicine Clinic Attending  Case discussed with Dr. Amin at the time of the visit.  We reviewed the resident's history and exam and pertinent patient test results.  I agree with the assessment, diagnosis, and plan of care documented in the resident's note.    

## 2018-07-26 ENCOUNTER — Other Ambulatory Visit: Payer: Self-pay

## 2018-07-26 ENCOUNTER — Ambulatory Visit (INDEPENDENT_AMBULATORY_CARE_PROVIDER_SITE_OTHER): Payer: Medicare HMO | Admitting: Internal Medicine

## 2018-07-26 ENCOUNTER — Other Ambulatory Visit: Payer: Self-pay | Admitting: *Deleted

## 2018-07-26 ENCOUNTER — Other Ambulatory Visit: Payer: Self-pay | Admitting: Internal Medicine

## 2018-07-26 DIAGNOSIS — I10 Essential (primary) hypertension: Principal | ICD-10-CM

## 2018-07-26 DIAGNOSIS — I129 Hypertensive chronic kidney disease with stage 1 through stage 4 chronic kidney disease, or unspecified chronic kidney disease: Secondary | ICD-10-CM

## 2018-07-26 DIAGNOSIS — M25461 Effusion, right knee: Secondary | ICD-10-CM

## 2018-07-26 DIAGNOSIS — G4733 Obstructive sleep apnea (adult) (pediatric): Secondary | ICD-10-CM

## 2018-07-26 DIAGNOSIS — M25561 Pain in right knee: Secondary | ICD-10-CM

## 2018-07-26 DIAGNOSIS — I152 Hypertension secondary to endocrine disorders: Secondary | ICD-10-CM

## 2018-07-26 DIAGNOSIS — M1711 Unilateral primary osteoarthritis, right knee: Secondary | ICD-10-CM

## 2018-07-26 DIAGNOSIS — M1990 Unspecified osteoarthritis, unspecified site: Secondary | ICD-10-CM

## 2018-07-26 DIAGNOSIS — E1159 Type 2 diabetes mellitus with other circulatory complications: Secondary | ICD-10-CM

## 2018-07-26 DIAGNOSIS — E1122 Type 2 diabetes mellitus with diabetic chronic kidney disease: Secondary | ICD-10-CM

## 2018-07-26 DIAGNOSIS — N183 Chronic kidney disease, stage 3 (moderate): Secondary | ICD-10-CM

## 2018-07-26 DIAGNOSIS — Z79899 Other long term (current) drug therapy: Secondary | ICD-10-CM

## 2018-07-26 DIAGNOSIS — E119 Type 2 diabetes mellitus without complications: Secondary | ICD-10-CM

## 2018-07-26 DIAGNOSIS — E785 Hyperlipidemia, unspecified: Secondary | ICD-10-CM

## 2018-07-26 DIAGNOSIS — Z7984 Long term (current) use of oral hypoglycemic drugs: Secondary | ICD-10-CM

## 2018-07-26 MED ORDER — SPIRONOLACTONE 25 MG PO TABS: 25.0000 mg | ORAL_TABLET | Freq: Every day | ORAL | 1 refills | Status: DC

## 2018-07-26 NOTE — Progress Notes (Signed)
   CC: follow up of hypertension   This is a telephone encounter between [name] and [name] on 07/26/2018 for follow up of hypertension. The visit was conducted with the patient located at home and [name] at [facility]. The patient's identity was confirmed using their DOB and current address. The patient has consented to being evaluated through a telephone encounter and understands the associated risks (an examination cannot be done and the patient may need to come in for an appointment) / benefits (allows the patient to remain at home, decreasing exposure to coronavirus). I personally spent 19 minutes on medical discussion.   HPI:  MargaretMargaret Leach is a 68 y.o. with PMH as below.   Please see A&P for assessment of the patient's acute and chronic medical conditions.   Past Medical History:  Diagnosis Date  . Arthritis    DJD, lumbar spondylosis  . Asthma   . Back pain   . Bilateral renal cysts   . Breast cancer ([place])    left breast invasive ductal ca in remission as of 10/07/11 s/p double mastectomy  . Bronchitis 03/10/2012  . Cataract   . Chemotherapy follow-up examination 03/06/2011  . Colon polyps   . Cough 03/06/2011  . Depression    seasonal melancholia  . E. coli UTI   . Edema extremities   . GERD (gastroesophageal reflux disease)   . Hyperlipidemia   . Hypertension   . Ileitis   . Kidney stones    non obstructive   . Myopathy    not statin related, cause unknown    Review of Systems:  Refer to history of present illness and assessment and plans for pertinent review of systems, all others reviewed and negative    Assessment & Plan:    Hypertension  At last office visit blood pressure was uncontrolled so hydralazine was increased from 25 mg to 50 mg BID. Today [name] checked her blood pressure while we were on the phone and found that it was 165/89 with HR 80 BPM. She reads out a list of prior readings from march that range from average systolic  [id number] with only one reading above 150 in the prior month. Currently she is prescribed Hydralazine, olmesartan 40 mg daily, carvedilol 12.5 mg BID, and spironolactone 25 mg. Last serum chemistry was in February and showed stable CKD III without electrolyte abnormalities. Uncontrolled hypertension is likely being driven by untreated OSA. Her blood pressure does fluctuate but on review of the prior month of readings it seems controlled overall.  - continue current medications  - bring blood pressure log to follow up office visit   Right knee pain and swelling  Patient was evaluated on via telehealth for right knee pain and swelling which was thought to be related to an acute exacerbation of her chronic osteoarthritis. She was prescribed diclofenac which she has been using in combination with a heating pad and these have helped to improve her discomfort.   Diabetes  Diabetes is well controlled on metformin.  - follow up A1c  - continue metformin   See Encounters Tab for problem based charting.  Patient discussed with Dr. Dareen Piano

## 2018-07-27 ENCOUNTER — Encounter: Payer: Self-pay | Admitting: Internal Medicine

## 2018-07-27 NOTE — Assessment & Plan Note (Signed)
Diabetes is well controlled on metformin.  - follow up A1c  - continue metformin

## 2018-07-27 NOTE — Assessment & Plan Note (Signed)
At last office visit blood pressure was uncontrolled so hydralazine was increased from 25 mg to 50 mg BID. Today [name] checked her blood pressure while we were on the phone and found that it was 165/89 with HR 80 BPM. She reads out a list of prior readings from march that range from average systolic 414E-395V with only one reading above 150 in the prior month. Currently she is prescribed Hydralazine, olmesartan 40 mg daily, carvedilol 12.5 mg BID, and spironolactone 25 mg. Last serum chemistry was in February and showed stable CKD III without electrolyte abnormalities. Uncontrolled hypertension is likely being driven by untreated OSA. Her blood pressure does fluctuate but on review of the prior month of readings it seems controlled overall.  - continue current medications  - bring blood pressure log to follow up office visit

## 2018-07-27 NOTE — Progress Notes (Signed)
Internal Medicine Clinic Attending  Case discussed with Dr. Blum at the time of the visit.  We reviewed the resident's history and exam and pertinent patient test results.  I agree with the assessment, diagnosis, and plan of care documented in the resident's note. 

## 2018-07-27 NOTE — Assessment & Plan Note (Signed)
Patient was evaluated on via telehealth for right knee pain and swelling which was thought to be related to an acute exacerbation of her chronic osteoarthritis. She was prescribed diclofenac which she has been using in combination with a heating pad and these have helped to improve her discomfort.

## 2018-07-28 ENCOUNTER — Ambulatory Visit: Payer: Medicare HMO | Admitting: Pulmonary Disease

## 2018-08-02 ENCOUNTER — Telehealth: Payer: Self-pay | Admitting: Adult Health

## 2018-08-02 NOTE — Telephone Encounter (Signed)
Spoke with pt and prefers a phone visit in place of office visit for 08/08/18.

## 2018-08-04 ENCOUNTER — Telehealth: Payer: Self-pay | Admitting: Speech-Language Pathologist

## 2018-08-04 MED ORDER — PANTOPRAZOLE SODIUM 20 MG PO TBEC: 20.0000 mg | DELAYED_RELEASE_TABLET | Freq: Every day | ORAL | 3 refills | Status: DC

## 2018-08-04 MED ORDER — LORATADINE 10 MG PO TABS: 10.0000 mg | ORAL_TABLET | Freq: Every day | ORAL | 3 refills | Status: DC

## 2018-08-04 NOTE — Telephone Encounter (Signed)
Refill Request  Pantoprazole   loratadine (CLARITIN) 10 MG tablet(Expired)   [facility] - [place], [place] - [address]

## 2018-08-04 NOTE — Telephone Encounter (Signed)
Thank you for letting me know. Ive sent in an Rx for the loratadine and protonix. There is the option to take the protonix on an as needed basis instead of daily and see if this helps to control her symptoms.

## 2018-08-04 NOTE — Telephone Encounter (Signed)
Called pt about Pantoprazole which was discontinued last year. Pt stated she starting to have acid reflux x 1 week. Thanks

## 2018-08-05 ENCOUNTER — Telehealth: Payer: Self-pay | Admitting: Internal Medicine

## 2018-08-05 NOTE — Telephone Encounter (Signed)
Pt is returning Lauren call pls call back [phone]

## 2018-08-05 NOTE — Telephone Encounter (Signed)
This has been addressed in phone encounter from 08/04/2018. Hubbard Hartshorn, RN, BSN

## 2018-08-05 NOTE — Telephone Encounter (Signed)
Patient notified that both meds were sent to [facility] yesterday and that protonix may be taken prn. Patient is very [employer]. Hubbard Hartshorn, RN, BSN

## 2018-08-05 NOTE — Telephone Encounter (Signed)
Returned call to patient. No answer. Left message on VM requesting return call. L. [place], RN, BSN     

## 2018-08-08 ENCOUNTER — Inpatient Hospital Stay: Payer: Medicare HMO | Attending: Adult Health | Admitting: Adult Health

## 2018-08-08 ENCOUNTER — Encounter: Payer: Self-pay | Admitting: Adult Health

## 2018-08-08 DIAGNOSIS — Z90710 Acquired absence of both cervix and uterus: Secondary | ICD-10-CM

## 2018-08-08 DIAGNOSIS — Z79899 Other long term (current) drug therapy: Secondary | ICD-10-CM

## 2018-08-08 DIAGNOSIS — C50412 Malignant neoplasm of upper-outer quadrant of left female breast: Secondary | ICD-10-CM

## 2018-08-08 DIAGNOSIS — I10 Essential (primary) hypertension: Secondary | ICD-10-CM

## 2018-08-08 DIAGNOSIS — Z79811 Long term (current) use of aromatase inhibitors: Secondary | ICD-10-CM | POA: Diagnosis not present

## 2018-08-08 DIAGNOSIS — Z9013 Acquired absence of bilateral breasts and nipples: Secondary | ICD-10-CM

## 2018-08-08 DIAGNOSIS — Z9221 Personal history of antineoplastic chemotherapy: Secondary | ICD-10-CM

## 2018-08-08 DIAGNOSIS — Z7982 Long term (current) use of aspirin: Secondary | ICD-10-CM | POA: Diagnosis not present

## 2018-08-08 DIAGNOSIS — Z170 Estrogen receptor positive status [ER+]: Secondary | ICD-10-CM | POA: Diagnosis not present

## 2018-08-08 NOTE — Progress Notes (Signed)
SURVIVORSHIP VIRTUAL VISIT:  I connected with [name] on 08/08/18 at 10:30 AM EDT by telephone and verified that I am speaking with the correct person using two identifiers.   I discussed the limitations, risks, security and privacy concerns of performing an evaluation and management service by telephone and the availability of in person appointments. I also discussed with the patient that there may be a patient responsible charge related to this service. The patient expressed understanding and agreed to proceed.     REASON FOR VISIT:  Routine follow-up for history of breast cancer.   BRIEF ONCOLOGIC HISTORY:    Breast cancer of upper-outer quadrant of left female breast ([place])   01/15/2010 Surgery     bilateral mastectomies: left breast IDC , ER/PR positive HER-2 positive  stage II a;  right breast benign    04/17/2010 - 04/17/2011 Chemotherapy    weekly Taxotere carboplatinum and Herceptin x 6 cycles  followed by Herceptin maintenance    09/01/2010 - 09/02/2015 Anti-estrogen oral therapy     letrozole 2.5 mg daily (BCI revealed 10%, high risk of recurrence but low probability of benefit from extended adjuvant therapy)      INTERVAL HISTORY:  [name] presents to the [facility] today for routine follow-up for her history of breast cancer.  Overall, she reports feeling quite well. She notes that she has not had any health changes since her last visit.  We did evaluate her for some abdominal aching with ultrasound, and it was negative for cancer, but did show a fatty liver.  The bloating has largely resolved, though sometimes she does have some intermittent discomfort.  [name] sees her PCP regularly.  She sees Dr. Hetty Ely about every 3 months.  She notes that she has diabetes, and is walking more than she was walking at this time last year, and had even started going to the gym some before the COVID 19 restrictions occurred.  She notes that she is up to date with skin and  colon cancer screening, and is in search for gynecologist.  Therasa notes that she does her best to follow diabetic diet, though she doesn't always adhere to the guidelines.  She denies any concerns or symptoms for recurrence today.      REVIEW OF SYSTEMS:  Review of Systems  Constitutional: Negative for appetite change, chills, fatigue, fever and unexpected weight change.  HENT:   Negative for hearing loss, lump/mass, mouth sores and trouble swallowing.   Eyes: Negative for eye problems and icterus.  Respiratory: Negative for chest tightness, cough and shortness of breath.   Cardiovascular: Negative for chest pain, leg swelling and palpitations.  Gastrointestinal: Negative for abdominal distention, abdominal pain, constipation, diarrhea, nausea and vomiting.  Endocrine: Negative for hot flashes.  Genitourinary: Negative for difficulty urinating.   Musculoskeletal: Positive for back pain (chronic ).  Skin: Negative for itching and rash.  Neurological: Negative for dizziness, extremity weakness, headaches and numbness.  Hematological: Negative for adenopathy. Does not bruise/bleed easily.  Psychiatric/Behavioral: Negative for depression. The patient is not nervous/anxious.         PAST MEDICAL/SURGICAL HISTORY:  Past Medical History:  Diagnosis Date  . Arthritis    DJD, lumbar spondylosis  . Asthma   . Back pain   . Bilateral renal cysts   . Breast cancer ([place])    left breast invasive ductal ca in remission as of 10/07/11 s/p double mastectomy  . Bronchitis 03/10/2012  . Cataract   . Chemotherapy follow-up examination 03/06/2011  .  Colon polyps   . Cough 03/06/2011  . Depression    seasonal melancholia  . E. coli UTI   . Edema extremities   . GERD (gastroesophageal reflux disease)   . Hyperlipidemia   . Hypertension   . Ileitis   . Kidney stones    non obstructive   . Myopathy    not statin related, cause unknown    Past Surgical History:  Procedure Laterality Date   . ABDOMINAL HYSTERECTOMY     for fibroids   . APPENDECTOMY    . BREAST SURGERY  2011   Bilateral mastectomy  . INCONTINENCE SURGERY    . LAMINECTOMY     C5-6 Dr. Louanne Skye 2005   . MASTECTOMY     bilateral   . OOPHORECTOMY    . TONSILLECTOMY       ALLERGIES:  Allergies  Allergen Reactions  . Atorvastatin     REACTION: Nausea, abdominal pain, and reflux. Pt has used the medication in the past and experienced the same reaction before the medication was discontinued.  Marland Kitchen Hctz [Hydrochlorothiazide]     Blurry vision  . Norvasc [Amlodipine Besylate]     Blurry vision  . Rosuvastatin     REACTION: Itches     CURRENT MEDICATIONS:  Outpatient Encounter Medications as of 08/08/2018  Medication Sig  . ACCU-CHEK FASTCLIX LANCETS MISC Check blood sugar daily  . albuterol (PROVENTIL HFA;VENTOLIN HFA) 108 (90 Base) MCG/ACT inhaler INHALE 1 TO 2 PUFFS INTO THE LUNGS EVERY 6 HOURS AS NEEDED FOR WHEEZING OR SHORTNESS OF BREATH.  Marland Kitchen aspirin 81 MG tablet Take 81 mg by mouth daily.  . beclomethasone (QVAR) 80 MCG/ACT inhaler Inhale 2 puffs into the lungs 2 (two) times daily.  . Blood Glucose Calibration (PRODIGY CONTROL SOLUTION) Low SOLN 1 each by In Vitro route every morning.  . Blood Glucose Monitoring Suppl (ACCU-CHEK AVIVA PLUS) w/Device KIT Check blood sugar daily as intructed  . carvedilol (COREG) 25 MG tablet TAKE 1/2 TABLET TWICE DAILY WITH MEALS  . cholecalciferol (VITAMIN D) 1000 UNITS tablet Take 1,000 Units by mouth daily.  . diclofenac sodium (VOLTAREN) 1 % GEL Apply 4 g topically 4 (four) times daily as needed.  . fluticasone (FLONASE) 50 MCG/ACT nasal spray Place 2 sprays into both nostrils daily.  Marland Kitchen glucose blood (ACCU-CHEK AVIVA PLUS) test strip Check blood sugar one time a day as instructed  . glucose blood test strip Use to check blood sugar 1 time daily diag code E11.9. Non insulin dependent  . hydrALAZINE (APRESOLINE) 50 MG tablet TAKE 1 TABLET TWICE DAILY  . loratadine  (CLARITIN) 10 MG tablet Take 1 tablet (10 mg total) by mouth daily.  Marland Kitchen LORazepam (ATIVAN) 1 MG tablet Take 1 tablet (1 mg total) by mouth as needed. This is a 30 day supply  . metFORMIN (GLUCOPHAGE-XR) 500 MG 24 hr tablet Take 2 tablets (1,000 mg total) by mouth daily with breakfast.  . Multiple Vitamin (MULTIVITAMIN) tablet Take 1 tablet by mouth daily.   Marland Kitchen olmesartan (BENICAR) 40 MG tablet Take 1 tablet (40 mg total) by mouth every evening.  . pantoprazole (PROTONIX) 20 MG tablet Take 1 tablet (20 mg total) by mouth daily.  . predniSONE (DELTASONE) 10 MG tablet Take 2 tabs for 5 days, then 1 tab for 5 days then stop.  Marland Kitchen PRODIGY SAFETY LANCETS 26G MISC 1 Units/day by Does not apply route daily. Use to check blood sugar 1 time daily. diag code E11.9. Non insulin dependent  .  simvastatin (ZOCOR) 20 MG tablet TAKE 1 TABLET AT BEDTIME.  Marland Kitchen spironolactone (ALDACTONE) 25 MG tablet Take 1 tablet (25 mg total) by mouth daily.  . traMADol (ULTRAM) 50 MG tablet Take 1 tablet (50 mg total) by mouth as needed for severe pain.   No facility-administered encounter medications on file as of 08/08/2018.      ONCOLOGIC FAMILY HISTORY:  Family History  Problem Relation Age of Onset  . Heart disease Mother 62       CAD  . Breast cancer Mother   . Cancer Mother 11       Breast cancer, mother  . Cancer Father 69       colon cancer dx'ed age 44  . Diabetes Father   . Coronary artery disease Other   . Multiple sclerosis Daughter   . Diabetes Brother   . Diabetes Brother     GENETIC COUNSELING/TESTING: Not at this time  SOCIAL HISTORY:  Social History   Socioeconomic History  . Marital status: Single    Spouse name: Not on file  . Number of children: Not on file  . Years of education: Not on file  . Highest education level: Not on file  Occupational History  . Not on file  Social Needs  . Financial resource strain: Not on file  . Food insecurity:    Worry: Not on file    Inability: Not on  file  . Transportation needs:    Medical: Not on file    Non-medical: Not on file  Tobacco Use  . Smoking status: Never Smoker  . Smokeless tobacco: Never Used  Substance and Sexual Activity  . Alcohol use: No    Alcohol/week: 0.0 standard drinks  . Drug use: No  . Sexual activity: Not on file  Lifestyle  . Physical activity:    Days per week: Not on file    Minutes per session: Not on file  . Stress: Not on file  Relationships  . Social connections:    Talks on phone: Not on file    Gets together: Not on file    Attends religious service: Not on file    Active member of club or organization: Not on file    Attends meetings of clubs or organizations: Not on file    Relationship status: Not on file  . Intimate partner violence:    Fear of current or ex partner: Not on file    Emotionally abused: Not on file    Physically abused: Not on file    Forced sexual activity: Not on file  Other Topics Concern  . Not on file  Social History Narrative   Single, 2 kids   Never smoked   No alcohol use   No drugs   Laid off from [employer] after working there for 14 years.       OBJECTIVE:  Patient is in no apparent distress.  Speech is non pressured.  Breathing is non labored.  Mood and behavior are normal.    LABORATORY DATA:  None for this visit   DIAGNOSTIC IMAGING:  Most recent mammogram:  S/p bilateral mastectomies    ASSESSMENT AND PLAN:  Ms.. Stenglein is a pleasant 68 y.o. female with history of Stage IIA left breast invasive ductal carcinoma, ER+/PR+/HER2+, diagnosed in 12/2009, treated with bilateral mastectomies, adjuvant chemotherapy, Herceptin maintenance x 1 year, and anti estrogen therapy with Letrozole completing 5 years of therapy in June, 2017.  She [facility] for surveillance and routine follow-up.   1. History of breast cancer:  Ms. Delisi is currently clinically and radiographically without evidence of disease or  recurrence of breast cancer. She will return to see [place] in 6 months, since our visit wasn't in person.  She will delay the visit to one year if she sees Dr. [name] before our visit.  I encouraged her to call me with any questions or concerns before her next visit at the cancer center, and I would be happy to see her sooner, if needed.    2. Bone health:  Given Ms. Kosman's age, history of breast cancer, and her previous anti-estrogen therapy with Letrozole, she is at risk for bone demineralization. Her last bone density testing was completed in 08/2013 and was normal with her lowest t score being 0.6.  Since she has completed Letrozole, and her bone density was so good, and in light of COVID 19 concerns, she will undergo repeat bone density testing in 08/2019 instead of 08/2018.  She is agreeable with this delay considering all of the factors.  In the meantime, she was encouraged to increase her consumption of foods rich in calcium, as well as increase her weight-bearing activities.  She was given education on specific food and activities to promote bone health.  3. Cancer screening:  Due to Ms. Filter's history and her age, she should receive screening for skin cancers, colon cancer, and gynecologic cancers. She was encouraged to follow-up with her PCP for appropriate cancer screenings.   4. Health maintenance and wellness promotion: Ms. Givhan was encouraged to consume 5-7 servings of fruits and vegetables per day. She was also encouraged to engage in moderate to vigorous exercise for 30 minutes per day most days of the week. She was instructed to limit her alcohol consumption and continue to abstain from tobacco use.    Follow up instructions:    -Return to cancer center in 6 months to one year  -Bone density in one year -Follow up with surgery per Dr. [name]   The patient was provided an opportunity to ask questions and all were answered. The patient agreed with the plan and demonstrated an  understanding of the instructions.   The patient was advised to call back or seek an in-person evaluation if the symptoms worsen or if the condition fails to improve as anticipated.   I provided 16 minutes of non face-to-face telephone visit time during this encounter, and > 50% was spent counseling as documented under my assessment & plan.   Gardenia Phlegm, [place] [phone]   Note: PRIMARY CARE PROVIDER [name], [place] [phone]

## 2018-08-09 ENCOUNTER — Encounter: Payer: Medicare HMO | Admitting: Adult Health

## 2018-08-09 ENCOUNTER — Other Ambulatory Visit: Payer: Medicare HMO

## 2018-08-17 ENCOUNTER — Other Ambulatory Visit: Payer: Medicare HMO

## 2018-08-18 DIAGNOSIS — H43813 Vitreous degeneration, bilateral: Secondary | ICD-10-CM | POA: Diagnosis not present

## 2018-08-18 DIAGNOSIS — E119 Type 2 diabetes mellitus without complications: Secondary | ICD-10-CM | POA: Diagnosis not present

## 2018-08-18 DIAGNOSIS — Z7984 Long term (current) use of oral hypoglycemic drugs: Secondary | ICD-10-CM | POA: Diagnosis not present

## 2018-08-18 DIAGNOSIS — H524 Presbyopia: Secondary | ICD-10-CM | POA: Diagnosis not present

## 2018-08-18 DIAGNOSIS — H25013 Cortical age-related cataract, bilateral: Secondary | ICD-10-CM | POA: Diagnosis not present

## 2018-08-18 DIAGNOSIS — H04123 Dry eye syndrome of bilateral lacrimal glands: Secondary | ICD-10-CM | POA: Diagnosis not present

## 2018-08-18 DIAGNOSIS — H5203 Hypermetropia, bilateral: Secondary | ICD-10-CM | POA: Diagnosis not present

## 2018-08-18 DIAGNOSIS — H2513 Age-related nuclear cataract, bilateral: Secondary | ICD-10-CM | POA: Diagnosis not present

## 2018-08-18 DIAGNOSIS — H43393 Other vitreous opacities, bilateral: Secondary | ICD-10-CM | POA: Diagnosis not present

## 2018-08-18 LAB — HM DIABETES EYE EXAM

## 2018-08-23 ENCOUNTER — Other Ambulatory Visit: Payer: Self-pay

## 2018-08-23 ENCOUNTER — Ambulatory Visit (INDEPENDENT_AMBULATORY_CARE_PROVIDER_SITE_OTHER): Payer: Medicare HMO | Admitting: Internal Medicine

## 2018-08-23 VITALS — BP 163/100 | HR 97 | Temp 98.9°F | Ht 68.0 in | Wt 233.7 lb

## 2018-08-23 DIAGNOSIS — M1711 Unilateral primary osteoarthritis, right knee: Secondary | ICD-10-CM

## 2018-08-23 NOTE — Progress Notes (Addendum)
    CC: Right Knee Pain  HPI:  [name] is a 68 y.o. F with PMHx listed below presenting for Right Knee Pain. Please see the A&P for the status of the patient's chronic medical problems.  Past Medical History:  Diagnosis Date  . Arthritis    DJD, lumbar spondylosis  . Asthma   . Back pain   . Bilateral renal cysts   . Breast cancer ([place])    left breast invasive ductal ca in remission as of 10/07/11 s/p double mastectomy  . Bronchitis 03/10/2012  . Cataract   . Chemotherapy follow-up examination 03/06/2011  . Colon polyps   . Cough 03/06/2011  . Depression    seasonal melancholia  . E. coli UTI   . Edema extremities   . GERD (gastroesophageal reflux disease)   . Hyperlipidemia   . Hypertension   . Ileitis   . Kidney stones    non obstructive   . Myopathy    not statin related, cause unknown    Review of Systems:  Performed and all others negative.  Physical Exam:  Vitals:   08/23/18 1531  BP: (!) 136/100  Pulse: 97  Temp: 98.9 F (37.2 C)  TempSrc: Oral  SpO2: 100%  Weight: 233 lb 11.2 oz (106 kg)  Height: 5\' 8"  (1.727 m)   Physical Exam Constitutional:      General: She is not in acute distress.    Appearance: Normal appearance.  Cardiovascular:     Rate and Rhythm: Normal rate and regular rhythm.     Pulses: Normal pulses.     Heart sounds: Normal heart sounds.  Pulmonary:     Effort: Pulmonary effort is normal. No respiratory distress.     Breath sounds: Normal breath sounds.  Abdominal:     General: Bowel sounds are normal. There is no distension.     Palpations: Abdomen is soft.     Tenderness: There is no abdominal tenderness.  Musculoskeletal:        General: No deformity.     Comments: Minimal R knee joint effusion No laxity of medial or lateral ligaments, negative lachman's Trace bilateral pedal edema  Skin:    General: Skin is warm and dry.  Neurological:     General: No focal deficit present.     Mental Status: Mental  status is at baseline.     Knee Injection Procedure Note  Diagnosis: right osteoarthritis  Indications: Symptom relief from osteoarthritis  Anesthesia: Lidocaine 2% without epinephrine  Procedure Details   Point of care ultrasound was used to identify the joint effusion and plan needle trajectory and depth. Consent was obtained for the procedure. The joint was prepped with Betadine. A 21 gauge needle was inserted into the superior aspect of the joint from a medial approach to access the suprapatellar pouch. 2 ml 1% lidocaine and 40 mg of Triamcinolone was then injected into the joint through the same needle. The needle was removed and the area cleansed and dressed.   Complications:  None; patient tolerated the procedure well.   Assessment & Plan:   See Encounters Tab for problem based charting.  Patient seen with Dr. Evette Doffing

## 2018-08-23 NOTE — Patient Instructions (Addendum)
Thank you for allowing [place] to care for you  For your knee pain - This is due to suspected osteoarthritis - You received a knee injection today for your pain  For your blood pressure - Elevated today, though you have been having pain - Follow up with PCP in the next month

## 2018-08-23 NOTE — Assessment & Plan Note (Addendum)
Patient presents due to increased pain of her right knee due to OA. She is requesting a repeat joint injection as the relief from her previous injection in October wore off in January and Voltaren gel has not been effective for her. She has not had xray's of her knee, but has clinical OA. Knee injection was performed, see procedure note. - Limit activity for the next couple of days - Continue with PRN pain relief medications

## 2018-08-24 ENCOUNTER — Encounter: Payer: Self-pay | Admitting: Internal Medicine

## 2018-08-24 NOTE — Progress Notes (Signed)
Internal Medicine Clinic Attending  I saw and evaluated the patient.  I personally confirmed the key portions of the history and exam documented by Dr. Trilby Drummer and I reviewed pertinent patient test results.  The assessment, diagnosis, and plan were formulated together and I agree with the documentation in the resident's note. I was present for the entirety of the procedure.

## 2018-09-05 ENCOUNTER — Telehealth: Payer: Self-pay | Admitting: *Deleted

## 2018-09-05 ENCOUNTER — Encounter: Payer: Self-pay | Admitting: Dietician

## 2018-09-05 NOTE — Telephone Encounter (Signed)
Asked to call [name] by triage nurse about : Blood sugars going up a little.  She reports her suspicion as to why they are increasing is " Snacking more on potato chips,  Crackers. She is thinking about walking. We discussed snacking on nuts, humus and carrots, peanut butter and celery, avocado, veggies, fruit cup.   Blood sugars: 174/181/156 in the morning 12-1028 am (after applesauce with meds at 6 AM); 12 pm- boiled egg x2, toast 154 3 hours afterwards; usual blood sugars- 119/132 5-18/146 5-21/129 5-22/123 5-25/132 6-1/119 6-7/132 Lab Results  Component Value Date   HGBA1C 6.7 (A) 02/15/2018   Her plan is to make some changes to her snacking and start walking.  Advised her to follow up with her doctor or me in the next 3-4 weeks to reassess how her plan is working to lower her blood sugars.  Debera Lat, RD 09/05/2018 4:56 PM.

## 2018-09-05 NOTE — Telephone Encounter (Signed)
Pt calls and states her cbg readings have been a little elevated recently, she had meter read back values for 1 week from 6/7 til 6/15 132 to 181 States she has been eating some thinks that may have caused part of problem and also not exercising as much as she should. Spoke to donnap., she will call pt and advise

## 2018-09-15 ENCOUNTER — Encounter: Payer: Self-pay | Admitting: *Deleted

## 2018-10-12 ENCOUNTER — Encounter: Payer: Self-pay | Admitting: Internal Medicine

## 2018-10-12 ENCOUNTER — Other Ambulatory Visit: Payer: Self-pay

## 2018-10-12 ENCOUNTER — Ambulatory Visit (INDEPENDENT_AMBULATORY_CARE_PROVIDER_SITE_OTHER): Payer: Medicare HMO | Admitting: Internal Medicine

## 2018-10-12 VITALS — BP 152/94 | HR 85 | Ht 68.0 in | Wt 237.0 lb

## 2018-10-12 DIAGNOSIS — E1159 Type 2 diabetes mellitus with other circulatory complications: Secondary | ICD-10-CM

## 2018-10-12 DIAGNOSIS — K760 Fatty (change of) liver, not elsewhere classified: Secondary | ICD-10-CM

## 2018-10-12 DIAGNOSIS — G4733 Obstructive sleep apnea (adult) (pediatric): Secondary | ICD-10-CM

## 2018-10-12 DIAGNOSIS — E119 Type 2 diabetes mellitus without complications: Secondary | ICD-10-CM | POA: Diagnosis not present

## 2018-10-12 DIAGNOSIS — K219 Gastro-esophageal reflux disease without esophagitis: Secondary | ICD-10-CM

## 2018-10-12 DIAGNOSIS — I10 Essential (primary) hypertension: Secondary | ICD-10-CM | POA: Diagnosis not present

## 2018-10-12 DIAGNOSIS — Z86010 Personal history of colonic polyps: Secondary | ICD-10-CM

## 2018-10-12 DIAGNOSIS — D638 Anemia in other chronic diseases classified elsewhere: Secondary | ICD-10-CM | POA: Diagnosis not present

## 2018-10-12 DIAGNOSIS — I152 Hypertension secondary to endocrine disorders: Secondary | ICD-10-CM

## 2018-10-12 DIAGNOSIS — Z79899 Other long term (current) drug therapy: Secondary | ICD-10-CM

## 2018-10-12 DIAGNOSIS — J45991 Cough variant asthma: Secondary | ICD-10-CM | POA: Diagnosis not present

## 2018-10-12 DIAGNOSIS — R1013 Epigastric pain: Secondary | ICD-10-CM | POA: Diagnosis not present

## 2018-10-12 DIAGNOSIS — Z7982 Long term (current) use of aspirin: Secondary | ICD-10-CM | POA: Diagnosis not present

## 2018-10-12 DIAGNOSIS — Z7984 Long term (current) use of oral hypoglycemic drugs: Secondary | ICD-10-CM

## 2018-10-12 DIAGNOSIS — Z7951 Long term (current) use of inhaled steroids: Secondary | ICD-10-CM

## 2018-10-12 LAB — POCT GLYCOSYLATED HEMOGLOBIN (HGB A1C): Hemoglobin A1C: 6.3 % — AB (ref 4.0–5.6)

## 2018-10-12 LAB — GLUCOSE, CAPILLARY: Glucose-Capillary: 116 mg/dL — ABNORMAL HIGH (ref 70–99)

## 2018-10-12 MED ORDER — FAMOTIDINE 20 MG PO TABS: 10.0000 mg | ORAL_TABLET | Freq: Every day | ORAL | 1 refills | Status: DC | PRN

## 2018-10-12 NOTE — Progress Notes (Signed)
   CC: blood pressure check  HPI:  [name] is a 68 y.o. with PMH as below. She has abdominal pain once per week that lasts about an hour or so, which has been going on for the past few months. When it goes away she suddenly feels very hungry. Occurs randomly, not at night or after or before eating. Pain is localized to the epigastric region and is intense. No diarrhea, constipation, or melena. She does sometimes have reflux and heart burn sometimes. She does not drink alcohol at all or smoke. She takes aspirin 81 mg daily and denies NSAID use. Last colonoscopy in 2019 with multiple polyps removed and she will have f/u in 2021. She is concerned because her brother recently passed away from a rupture ulcer, and this has made her worried.  She denies SOB or cough and recently saw her pulmonologist. She denies dysuria, change in frequency. She denies fatigue, weight loss, or night sweats.   Please see A&P for assessment of the patient's acute and chronic medical conditions.    Past Medical History:  Diagnosis Date  . Arthritis    DJD, lumbar spondylosis  . Asthma   . Back pain   . Bilateral renal cysts   . Breast cancer ([place])    left breast invasive ductal ca in remission as of 10/07/11 s/p double mastectomy  . Bronchitis 03/10/2012  . Cataract   . Chemotherapy follow-up examination 03/06/2011  . Colon polyps   . Cough 03/06/2011  . Depression    seasonal melancholia  . E. coli UTI   . Edema extremities   . GERD (gastroesophageal reflux disease)   . Hyperlipidemia   . Hypertension   . Ileitis   . Kidney stones    non obstructive   . Myopathy    not statin related, cause unknown    Review of Systems:   ROS negative except as noted in HPI   Physical Exam:  Constitution: NAD, obese  Cardio: RRR, no m/r/g; no LE edema  Respiratory: CTA, no wheezing rhonchi or rales Abdominal: soft, non-distended, NTTP, +BS  Neuro: a&o, normal affect  Skin: c/d/i    Vitals:   10/12/18 1030 10/12/18 1039  BP: (!) 160/90 (!) 152/94  Pulse: 89 85  SpO2: 100%   Weight: 237 lb (107.5 kg)   Height: 5\' 8"  (1.727 m)      Assessment & Plan:   See Encounters Tab for problem based charting.  Patient discussed with Dr. Rebeca Alert

## 2018-10-12 NOTE — Patient Instructions (Signed)
Thank you for allowing [place] to provide your care today. Today we discussed your type II diabetes,     I have ordered the following labs for you:  Complete metabolic panel, complete blood count, iron level, vitamin B12.    I will call if any are abnormal.    Today we made the following changes to your medications:   Please START taking  Famotidine (pepcid) 10 mg tablet for symptoms of gastroesophageal reflux  Please follow-up in three months for hemoglobin a1c check.    Should you have any questions or concerns please call the internal medicine clinic at [phone].

## 2018-10-13 ENCOUNTER — Other Ambulatory Visit: Payer: Self-pay | Admitting: Adult Health

## 2018-10-14 ENCOUNTER — Other Ambulatory Visit: Payer: Self-pay | Admitting: Adult Health

## 2018-10-15 LAB — CBC
Hematocrit: 29 % — ABNORMAL LOW (ref 34.0–46.6)
Hemoglobin: 9.5 g/dL — ABNORMAL LOW (ref 11.1–15.9)
MCH: 29.1 pg (ref 26.6–33.0)
MCHC: 32.8 g/dL (ref 31.5–35.7)
MCV: 89 fL (ref 79–97)
Platelets: 199 10*3/uL (ref 150–450)
RBC: 3.26 x10E6/uL — ABNORMAL LOW (ref 3.77–5.28)
RDW: 13.2 % (ref 11.7–15.4)
WBC: 6.1 10*3/uL (ref 3.4–10.8)

## 2018-10-15 LAB — IRON AND TIBC
Iron Saturation: 23 % (ref 15–55)
Iron: 62 ug/dL (ref 27–139)
Total Iron Binding Capacity: 265 ug/dL (ref 250–450)
UIBC: 203 ug/dL (ref 118–369)

## 2018-10-15 LAB — CMP14 + ANION GAP
ALT: 36 IU/L — ABNORMAL HIGH (ref 0–32)
AST: 34 IU/L (ref 0–40)
Albumin/Globulin Ratio: 2.2 (ref 1.2–2.2)
Albumin: 4.7 g/dL (ref 3.8–4.8)
Alkaline Phosphatase: 71 IU/L (ref 39–117)
Anion Gap: 20 mmol/L — ABNORMAL HIGH (ref 10.0–18.0)
BUN/Creatinine Ratio: 13 (ref 12–28)
BUN: 22 mg/dL (ref 8–27)
Bilirubin Total: 0.3 mg/dL (ref 0.0–1.2)
CO2: 16 mmol/L — ABNORMAL LOW (ref 20–29)
Calcium: 9.4 mg/dL (ref 8.7–10.3)
Chloride: 107 mmol/L — ABNORMAL HIGH (ref 96–106)
Creatinine, Ser: 1.63 mg/dL — ABNORMAL HIGH (ref 0.57–1.00)
GFR calc Af Amer: 37 mL/min/{1.73_m2} — ABNORMAL LOW (ref >59)
GFR calc non Af Amer: 32 mL/min/{1.73_m2} — ABNORMAL LOW (ref >59)
Globulin, Total: 2.1 g/dL (ref 1.5–4.5)
Glucose: 120 mg/dL — ABNORMAL HIGH (ref 65–99)
Potassium: 5 mmol/L (ref 3.5–5.2)
Sodium: 143 mmol/L (ref 134–144)
Total Protein: 6.8 g/dL (ref 6.0–8.5)

## 2018-10-15 LAB — VITAMIN B12: Vitamin B-12: 1092 pg/mL (ref 232–1245)

## 2018-10-15 LAB — FERRITIN: Ferritin: 884 ng/mL — ABNORMAL HIGH (ref 15–150)

## 2018-10-15 LAB — METHYLMALONIC ACID, SERUM: Methylmalonic Acid: 225 nmol/L (ref 0–378)

## 2018-10-16 NOTE — Assessment & Plan Note (Signed)
BP Readings from Last 3 Encounters:  10/12/18 (!) 152/94  08/23/18 (!) 163/100  05/10/18 (!) 141/79   Current medications include hydralazine 50 mg, olmesartan 40 mg qd, carvedilol 12.5 mg BID, and spironolactone 25 mg. She records her blood pressure and brought her log today. She states she was feeling stressed walking here and got tired. She has diligent recordings of her blood pressure at home, which range from 114/62 - 137/75. Blood pressure consistently elevated in clinic may be secondary to exertion or white coat hypertension.   - continue current medications

## 2018-10-16 NOTE — Assessment & Plan Note (Signed)
Last hemoglobin a1c 6.7. Today 6.3. She is taking metformin xr 1000 mg qd. She had an eye exam 08/18/18 which showed no retinopathy.   - continue current medications  - continue simvastatin 20 mg qd

## 2018-10-16 NOTE — Assessment & Plan Note (Signed)
This condition is stable and controlled. She follows with pulmonology.   - continue beclomethasone 80 mcg/act 2 puffs bid and albuterol q6h prn

## 2018-10-16 NOTE — Assessment & Plan Note (Addendum)
Symptoms possibly secondary to reflux/gastritis. She is no longer taking protonix and has taken this daily in the past. She also has a history of NAFLD seen incidentally on RUQ [place] in 2019 for gas and bloating with stable hepatic function and normal LFTs. She received Hep A & B immunizations after this. History of anemia with diagnosis of anemia of chronic disease. Iron studies have showed elevated ferritin in the past with normal iron and TSAT.   - CMP - CBC, iron studies with history of anemia - famotidine qd prn

## 2018-10-28 NOTE — Progress Notes (Signed)
Internal Medicine Clinic Attending  Case discussed with Dr. Seawell at the time of the visit.  We reviewed the resident's history and exam and pertinent patient test results.  I agree with the assessment, diagnosis, and plan of care documented in the resident's note.  Alexander Raines, M.D., Ph.D.  

## 2018-10-31 ENCOUNTER — Other Ambulatory Visit: Payer: Self-pay

## 2018-10-31 MED ORDER — METFORMIN HCL ER 500 MG PO TB24: 1000.0000 mg | ORAL_TABLET | Freq: Every day | ORAL | 3 refills | Status: DC

## 2018-10-31 NOTE — Telephone Encounter (Signed)
metFORMIN (GLUCOPHAGE-XR) 500 MG 24 hr tablet, REFILL REQUEST @  [facility], [place] [phone] (Phone) [phone] (Fax)

## 2018-11-07 ENCOUNTER — Telehealth: Payer: Self-pay | Admitting: *Deleted

## 2018-11-07 ENCOUNTER — Other Ambulatory Visit: Payer: Self-pay | Admitting: Internal Medicine

## 2018-11-07 DIAGNOSIS — E119 Type 2 diabetes mellitus without complications: Secondary | ICD-10-CM

## 2018-11-07 MED ORDER — TRUE METRIX AIR GLUCOSE METER DEVI: 1.0000 | Freq: Three times a day (TID) | 0 refills | Status: DC | PRN

## 2018-11-07 MED ORDER — TRUE METRIX BLOOD GLUCOSE TEST VI STRP: ORAL_STRIP | 12 refills | Status: DC

## 2018-11-07 MED ORDER — TRUEPLUS LANCETS 33G MISC: 12 refills | Status: DC

## 2018-11-07 NOTE — Telephone Encounter (Signed)
Supplies ordered.

## 2018-11-07 NOTE — Telephone Encounter (Signed)
Next appt scheduled  8/19 with PCP.  Fax from [employer] - requesting rxs for [employer], [employer] True Matrix test strips and Truepkus 33 G lancets. Thanks

## 2018-11-08 ENCOUNTER — Other Ambulatory Visit: Payer: Self-pay | Admitting: *Deleted

## 2018-11-08 NOTE — Telephone Encounter (Signed)
Removed old meters and testing supplies

## 2018-11-09 ENCOUNTER — Other Ambulatory Visit: Payer: Self-pay | Admitting: *Deleted

## 2018-11-09 ENCOUNTER — Other Ambulatory Visit: Payer: Self-pay

## 2018-11-09 ENCOUNTER — Encounter: Payer: Self-pay | Admitting: Internal Medicine

## 2018-11-09 ENCOUNTER — Ambulatory Visit (INDEPENDENT_AMBULATORY_CARE_PROVIDER_SITE_OTHER): Payer: Medicare HMO | Admitting: Internal Medicine

## 2018-11-09 VITALS — BP 159/86 | HR 88 | Wt 234.4 lb

## 2018-11-09 DIAGNOSIS — J45991 Cough variant asthma: Secondary | ICD-10-CM

## 2018-11-09 DIAGNOSIS — E538 Deficiency of other specified B group vitamins: Secondary | ICD-10-CM | POA: Diagnosis not present

## 2018-11-09 DIAGNOSIS — I129 Hypertensive chronic kidney disease with stage 1 through stage 4 chronic kidney disease, or unspecified chronic kidney disease: Secondary | ICD-10-CM | POA: Diagnosis not present

## 2018-11-09 DIAGNOSIS — I152 Hypertension secondary to endocrine disorders: Secondary | ICD-10-CM

## 2018-11-09 DIAGNOSIS — R1013 Epigastric pain: Secondary | ICD-10-CM

## 2018-11-09 DIAGNOSIS — N189 Chronic kidney disease, unspecified: Secondary | ICD-10-CM

## 2018-11-09 DIAGNOSIS — E1159 Type 2 diabetes mellitus with other circulatory complications: Secondary | ICD-10-CM

## 2018-11-09 DIAGNOSIS — E119 Type 2 diabetes mellitus without complications: Secondary | ICD-10-CM

## 2018-11-09 DIAGNOSIS — K760 Fatty (change of) liver, not elsewhere classified: Secondary | ICD-10-CM

## 2018-11-09 DIAGNOSIS — N183 Chronic kidney disease, stage 3 unspecified: Secondary | ICD-10-CM

## 2018-11-09 DIAGNOSIS — E1122 Type 2 diabetes mellitus with diabetic chronic kidney disease: Secondary | ICD-10-CM | POA: Diagnosis not present

## 2018-11-09 DIAGNOSIS — K219 Gastro-esophageal reflux disease without esophagitis: Secondary | ICD-10-CM | POA: Diagnosis not present

## 2018-11-09 DIAGNOSIS — Z9013 Acquired absence of bilateral breasts and nipples: Secondary | ICD-10-CM

## 2018-11-09 DIAGNOSIS — D638 Anemia in other chronic diseases classified elsewhere: Secondary | ICD-10-CM | POA: Diagnosis not present

## 2018-11-09 DIAGNOSIS — Z79899 Other long term (current) drug therapy: Secondary | ICD-10-CM

## 2018-11-09 DIAGNOSIS — Z853 Personal history of malignant neoplasm of breast: Secondary | ICD-10-CM

## 2018-11-09 MED ORDER — ALBUTEROL SULFATE HFA 108 (90 BASE) MCG/ACT IN AERS: INHALATION_SPRAY | RESPIRATORY_TRACT | 4 refills | Status: DC

## 2018-11-09 MED ORDER — TRUE METRIX LEVEL 1 LOW VI SOLN: 0 refills | Status: DC

## 2018-11-09 NOTE — Telephone Encounter (Signed)
Have sent this in another encounter

## 2018-11-09 NOTE — Assessment & Plan Note (Signed)
Hemoglobin below baseline at last visit. She takes vitamin B12 for B12 deficiency and this was stable at last visit. Previously she was on iron supplementation but with elevated ferritin and low retic count was determined to have anemia of chronic disease, which may be secondary to her CKD.   - repeat CBC today

## 2018-11-09 NOTE — Patient Instructions (Addendum)
Thank you for allowing [place] to provide your care today. Today we discussed your hypertension, anemia, and type II diabetes.   Please make sure to bring your blood pressure cuff at follow-up.   I have ordered the following labs for you:  Basic metabolic panel, complete blood panel, and immunology labs.    I will call if any are abnormal.    Today we made the following changes to your medications:   Please follow-up in two months for hemoglobin a1c check and to check your blood pressure cuff.   Please call the internal medicine center clinic if you have any questions or concerns, we may be able to help and keep you from a long and expensive emergency room wait. Our clinic and after hours phone number is [phone], the best time to call is Monday through Friday 9 am to 4 pm but there is always someone available 24/7 if you have an emergency. If you need medication refills please notify your pharmacy one week in advance and they will send [place] a request.

## 2018-11-09 NOTE — Assessment & Plan Note (Addendum)
Incidental on imaging. During last visit ALT slightly elevated. Prior to this LFTs have been intermittently elevated. She does not drink, hepatitis ruled out and she has received Hep A& B vaccinations. She does have consistently elevated ferritin and chronic anemia, thought to be secondary to chronic disease. Her B12 deficiency is well treated. She has a history of breast cancer in 2011 with bilateral mastectomy. I am concerned for possible chronic inflammatory state and will order labs to rule out immunological sources of her fatty liver as well.   - GGT, ANA, Anti-microsomal antibody, anti-smooth muscle antibody

## 2018-11-09 NOTE — Assessment & Plan Note (Signed)
She has had a couple of episodes of abdominal pain since her last visit but states her symptoms are relieved by pepcid started at last visit. She has only had to take this 2-3 times.

## 2018-11-09 NOTE — Assessment & Plan Note (Signed)
Previous intermittent abdominal pain is relieved when she takes pepcid prn. She has required this 2-3 times since her last visit one month ago.   - continue famotidine for GERD.

## 2018-11-09 NOTE — Progress Notes (Signed)
   CC: normocytic anemia  HPI:  [name] is a 68 y.o. with PMH as below.   Please see A&P for assessment of the patient's acute and chronic medical conditions.    Past Medical History:  Diagnosis Date  . Arthritis    DJD, lumbar spondylosis  . Asthma   . Back pain   . Bilateral renal cysts   . Breast cancer ([place])    left breast invasive ductal ca in remission as of 10/07/11 s/p double mastectomy  . Bronchitis 03/10/2012  . Cataract   . Chemotherapy follow-up examination 03/06/2011  . Colon polyps   . Cough 03/06/2011  . Depression    seasonal melancholia  . E. coli UTI   . Edema extremities   . GERD (gastroesophageal reflux disease)   . Hyperlipidemia   . Hypertension   . Ileitis   . Kidney stones    non obstructive   . Myopathy    not statin related, cause unknown    Review of Systems:   Review of Systems  Respiratory: Negative for shortness of breath and wheezing.   Cardiovascular: Negative for chest pain and leg swelling.  Gastrointestinal: Negative for abdominal pain, constipation, diarrhea, heartburn and vomiting.  Genitourinary: Negative for dysuria, frequency and urgency.   Physical Exam:  Constitution: NAD, pleasant  Cardio: rrr, no m/r/g; no LE edema  Respiratory: CTA, w/r/r  Abdominal: NTTP, soft, non-distneded Skin: c/d/i    Vitals:   11/09/18 1033  BP: (!) 159/86  Pulse: 88  SpO2: 100%  Weight: 234 lb 6.4 oz (106.3 kg)     Assessment & Plan:   See Encounters Tab for problem based charting.  Patient discussed with Dr. Lynnae January

## 2018-11-09 NOTE — Assessment & Plan Note (Addendum)
BP Readings from Last 3 Encounters:  11/09/18 (!) 159/86  10/12/18 (!) 152/94  08/23/18 (!) 163/100   Blood pressure consistently elevated in clinic although she checks this at home and brings in readings, which are consistently at or below 130/80. She continues to take her coreg 25 mg qd, olmesartan 40 mg qd, aldactone 25mg  qd, hydralazine 50 mg bid.   ADDENDUM: Worsening Cr results discussed with patient. She will stop aldactone and has clinic appointment scheduled for tomorrow for UA, CMP, Mg. Will consider switching olmesartan as well, and she should be bringing her home blood pressure cuff as previously planned, which has been showing normal bp readings while clinic bp has been consistently elevated.   - bring blood pressure cuff at follow-up to compare readings - BMP today

## 2018-11-09 NOTE — Assessment & Plan Note (Signed)
Glucose readings brought today and well controlled ranging from 110-140. No symptoms of hypoglycemia.   - f/u two months for A1c  - continue current medications  - new glucometer ordered per insurance request

## 2018-11-09 NOTE — Telephone Encounter (Signed)
[place] with [facility] requesting a refill on True metrix Level 1 solution. Please call back.

## 2018-11-10 ENCOUNTER — Other Ambulatory Visit: Payer: Self-pay | Admitting: Internal Medicine

## 2018-11-10 DIAGNOSIS — N183 Chronic kidney disease, stage 3 unspecified: Secondary | ICD-10-CM

## 2018-11-10 LAB — CBC
Hematocrit: 31.8 % — ABNORMAL LOW (ref 34.0–46.6)
Hemoglobin: 10.2 g/dL — ABNORMAL LOW (ref 11.1–15.9)
MCH: 28.7 pg (ref 26.6–33.0)
MCHC: 32.1 g/dL (ref 31.5–35.7)
MCV: 90 fL (ref 79–97)
Platelets: 222 10*3/uL (ref 150–450)
RBC: 3.55 x10E6/uL — ABNORMAL LOW (ref 3.77–5.28)
RDW: 13.4 % (ref 11.7–15.4)
WBC: 6 10*3/uL (ref 3.4–10.8)

## 2018-11-10 LAB — BMP8+ANION GAP
Anion Gap: 17 mmol/L (ref 10.0–18.0)
BUN/Creatinine Ratio: 13 (ref 12–28)
BUN: 24 mg/dL (ref 8–27)
CO2: 21 mmol/L (ref 20–29)
Calcium: 10.4 mg/dL — ABNORMAL HIGH (ref 8.7–10.3)
Chloride: 104 mmol/L (ref 96–106)
Creatinine, Ser: 1.83 mg/dL — ABNORMAL HIGH (ref 0.57–1.00)
GFR calc Af Amer: 32 mL/min/{1.73_m2} — ABNORMAL LOW (ref >59)
GFR calc non Af Amer: 28 mL/min/{1.73_m2} — ABNORMAL LOW (ref >59)
Glucose: 120 mg/dL — ABNORMAL HIGH (ref 65–99)
Potassium: 5.4 mmol/L — ABNORMAL HIGH (ref 3.5–5.2)
Sodium: 142 mmol/L (ref 134–144)

## 2018-11-10 LAB — ANTINUCLEAR ANTIBODIES, IFA: ANA Titer 1: NEGATIVE

## 2018-11-10 LAB — ANTI-SMOOTH MUSCLE ANTIBODY, IGG: Smooth Muscle Ab: 9 Units (ref 0–19)

## 2018-11-10 LAB — GAMMA GT: GGT: 32 IU/L (ref 0–60)

## 2018-11-10 LAB — ANTI-MICROSOMAL ANTIBODY LIVER / KIDNEY: LKM1 Ab: 0.8 Units (ref 0.0–20.0)

## 2018-11-10 NOTE — Progress Notes (Signed)
results discussed with patient. She will stop aldactone with hyperkalemia and has clinic appointment scheduled for tomorrow for UA, CMP, Mg. Will consider switching olmesartan as well, and she should be bringing her home blood pressure cuff as previously planned, which has been showing normal bp readings while clinic bp has been consistently

## 2018-11-11 ENCOUNTER — Other Ambulatory Visit: Payer: Self-pay

## 2018-11-11 ENCOUNTER — Ambulatory Visit (INDEPENDENT_AMBULATORY_CARE_PROVIDER_SITE_OTHER): Payer: Medicare HMO | Admitting: Internal Medicine

## 2018-11-11 VITALS — BP 167/93 | HR 79 | Temp 98.7°F | Ht 68.0 in | Wt 235.2 lb

## 2018-11-11 DIAGNOSIS — I129 Hypertensive chronic kidney disease with stage 1 through stage 4 chronic kidney disease, or unspecified chronic kidney disease: Secondary | ICD-10-CM

## 2018-11-11 DIAGNOSIS — E1122 Type 2 diabetes mellitus with diabetic chronic kidney disease: Secondary | ICD-10-CM | POA: Diagnosis not present

## 2018-11-11 DIAGNOSIS — N183 Chronic kidney disease, stage 3 unspecified: Secondary | ICD-10-CM

## 2018-11-11 DIAGNOSIS — Z79899 Other long term (current) drug therapy: Secondary | ICD-10-CM

## 2018-11-11 DIAGNOSIS — I10 Essential (primary) hypertension: Secondary | ICD-10-CM

## 2018-11-11 DIAGNOSIS — I152 Hypertension secondary to endocrine disorders: Secondary | ICD-10-CM

## 2018-11-11 DIAGNOSIS — E1159 Type 2 diabetes mellitus with other circulatory complications: Secondary | ICD-10-CM

## 2018-11-11 DIAGNOSIS — Z7984 Long term (current) use of oral hypoglycemic drugs: Secondary | ICD-10-CM | POA: Diagnosis not present

## 2018-11-11 LAB — POCT URINALYSIS DIPSTICK
Bilirubin, UA: NEGATIVE
Glucose, UA: NEGATIVE
Ketones, UA: NEGATIVE
Nitrite, UA: NEGATIVE
Protein, UA: NEGATIVE
Spec Grav, UA: <=1.005 — AB (ref 1.010–1.025)
Urobilinogen, UA: 0.2 E.U./dL
pH, UA: 5 (ref 5.0–8.0)

## 2018-11-11 MED ORDER — AMLODIPINE BESYLATE 5 MG PO TABS: 5.0000 mg | ORAL_TABLET | Freq: Every day | ORAL | 11 refills | Status: DC

## 2018-11-11 NOTE — Patient Instructions (Signed)
Thank you, Ms.Edythe Clarity for allowing [place] to provide your care today. Today we discussed chronic kidney disease.    I have ordered CMP and urinalysis labs for you. I will call if any are abnormal.    I have place a referrals to Nephrology for Chronic kidney disease management   I have ordered the following tests: none   We changes the following medications: Stop taking spironolactone.  Sitting your blood pressure remains elevated at this visit of 163/94, I will be prescribing amlodipine 5 mg daily.  Please call us if you have any signs or symptoms of low blood pressure like fatigue or dizziness.  Or if you are having a reaction to the medication.  Please follow-up in 1 week..    Should you have any questions or concerns please call the internal medicine clinic at [phone].    Marianna Payment, D.O. [facility]

## 2018-11-11 NOTE — Progress Notes (Signed)
NoteRutherford Nail, RN

## 2018-11-11 NOTE — Progress Notes (Signed)
Internal Medicine Clinic Attending  Case discussed with Dr. Seawell at the time of the visit.  We reviewed the resident's history and exam and pertinent patient test results.  I agree with the assessment, diagnosis, and plan of care documented in the resident's note.    

## 2018-11-12 LAB — CMP14 + ANION GAP
ALT: 23 IU/L (ref 0–32)
AST: 24 IU/L (ref 0–40)
Albumin/Globulin Ratio: 1.9 (ref 1.2–2.2)
Albumin: 4.6 g/dL (ref 3.8–4.8)
Alkaline Phosphatase: 54 IU/L (ref 39–117)
Anion Gap: 16 mmol/L (ref 10.0–18.0)
BUN/Creatinine Ratio: 13 (ref 12–28)
BUN: 22 mg/dL (ref 8–27)
Bilirubin Total: 0.3 mg/dL (ref 0.0–1.2)
CO2: 20 mmol/L (ref 20–29)
Calcium: 10 mg/dL (ref 8.7–10.3)
Chloride: 103 mmol/L (ref 96–106)
Creatinine, Ser: 1.64 mg/dL — ABNORMAL HIGH (ref 0.57–1.00)
GFR calc Af Amer: 37 mL/min/{1.73_m2} — ABNORMAL LOW (ref >59)
GFR calc non Af Amer: 32 mL/min/{1.73_m2} — ABNORMAL LOW (ref >59)
Globulin, Total: 2.4 g/dL (ref 1.5–4.5)
Glucose: 131 mg/dL — ABNORMAL HIGH (ref 65–99)
Potassium: 5.4 mmol/L — ABNORMAL HIGH (ref 3.5–5.2)
Sodium: 139 mmol/L (ref 134–144)
Total Protein: 7 g/dL (ref 6.0–8.5)

## 2018-11-13 NOTE — Progress Notes (Signed)
   CC:  Chronic Kidney disease  HPI:  [name] is a 68 y.o. female with a past medical history stated below and presents today Further evaluation and management of her chronic kidney disease and resistant hypertension. Please see problem based assessment and plan for additional details.   Past Medical History:  Diagnosis Date  . Arthritis    DJD, lumbar spondylosis  . Asthma   . Back pain   . Bilateral renal cysts   . Breast cancer ([place])    left breast invasive ductal ca in remission as of 10/07/11 s/p double mastectomy  . Bronchitis 03/10/2012  . Cataract   . Chemotherapy follow-up examination 03/06/2011  . Colon polyps   . Cough 03/06/2011  . Depression    seasonal melancholia  . E. coli UTI   . Edema extremities   . GERD (gastroesophageal reflux disease)   . Hyperlipidemia   . Hypertension   . Ileitis   . Kidney stones    non obstructive   . Myopathy    not statin related, cause unknown      Review of Systems: Review of Systems  Constitutional: Negative for chills, fever and malaise/fatigue.  Eyes: Negative for blurred vision.  Cardiovascular: Negative for chest pain, palpitations, orthopnea, claudication and leg swelling.  Genitourinary: Negative for dysuria and urgency.  Musculoskeletal: Negative for myalgias.     Vitals:   11/11/18 1044 11/11/18 1137  BP: (!) 163/94 (!) 167/93  Pulse: 88 79  Temp: 98.7 F (37.1 C)   TempSrc: Oral   SpO2: 100%   Weight: 235 lb 3.2 oz (106.7 kg)   Height: 5\' 8"  (1.727 m)      Physical Exam: Physical Exam  Constitutional: She is oriented to person, place, and time and well-developed, well-nourished, and in no distress.  Cardiovascular: Normal rate, regular rhythm, normal heart sounds and intact distal pulses. Exam reveals no gallop and no friction rub.  No murmur heard. Pulmonary/Chest: Effort normal and breath sounds normal.  Neurological: She is alert and oriented to person, place, and time.  Skin:  Skin is warm and dry.     Assessment & Plan:   See Encounters Tab for problem based charting.  Patient seen with Dr. Dareen Piano

## 2018-11-14 NOTE — Assessment & Plan Note (Signed)
Dr. Sharon Seller asked the patient to bring in her blood pressure cuff today in order to compare to our blood pressure cuff.  She did not.  Her blood pressureremains elevated at 167/93  : -I will prescribe her amlodipine 5 mg p.o. daily

## 2018-11-14 NOTE — Assessment & Plan Note (Signed)
Following up from her recent appointment yesterday.  Due to her chronic kidney disease spironolactone was discontinued.  Consider changing metformin if GFR is below 30.  Patient was advised to continue taking her ACE inhibitor.    Plan: -Dr. Aurelio Jew request I will put in a referral for nephrology today and repeat her metabolic panel. -Patient was advised to remain off of her Spironolactone at this time.

## 2018-11-16 LAB — PTH, INTACT AND CALCIUM

## 2018-11-16 NOTE — Addendum Note (Signed)
Addended by: Aldine Contes on: 11/16/2018 11:21 AM   Modules accepted: Level of Service

## 2018-11-16 NOTE — Progress Notes (Signed)
Internal Medicine Clinic Attending  I saw and evaluated the patient.  I personally confirmed the key portions of the history and exam documented by Dr. Coe and I reviewed pertinent patient test results.  The assessment, diagnosis, and plan were formulated together and I agree with the documentation in the resident's note.    

## 2018-11-17 ENCOUNTER — Encounter: Payer: Self-pay | Admitting: Internal Medicine

## 2018-11-17 ENCOUNTER — Other Ambulatory Visit: Payer: Self-pay

## 2018-11-17 ENCOUNTER — Ambulatory Visit (INDEPENDENT_AMBULATORY_CARE_PROVIDER_SITE_OTHER): Payer: Medicare HMO | Admitting: Internal Medicine

## 2018-11-17 VITALS — BP 158/92 | HR 90 | Temp 98.5°F | Ht 68.0 in | Wt 234.8 lb

## 2018-11-17 DIAGNOSIS — K219 Gastro-esophageal reflux disease without esophagitis: Secondary | ICD-10-CM

## 2018-11-17 DIAGNOSIS — N183 Chronic kidney disease, stage 3 unspecified: Secondary | ICD-10-CM

## 2018-11-17 DIAGNOSIS — I152 Hypertension secondary to endocrine disorders: Secondary | ICD-10-CM

## 2018-11-17 DIAGNOSIS — Z79899 Other long term (current) drug therapy: Secondary | ICD-10-CM | POA: Diagnosis not present

## 2018-11-17 DIAGNOSIS — E1159 Type 2 diabetes mellitus with other circulatory complications: Secondary | ICD-10-CM | POA: Diagnosis not present

## 2018-11-17 DIAGNOSIS — I129 Hypertensive chronic kidney disease with stage 1 through stage 4 chronic kidney disease, or unspecified chronic kidney disease: Secondary | ICD-10-CM | POA: Diagnosis not present

## 2018-11-17 DIAGNOSIS — E1122 Type 2 diabetes mellitus with diabetic chronic kidney disease: Secondary | ICD-10-CM | POA: Diagnosis not present

## 2018-11-17 MED ORDER — FAMOTIDINE 20 MG PO TABS: 10.0000 mg | ORAL_TABLET | Freq: Two times a day (BID) | ORAL | 1 refills | Status: DC

## 2018-11-17 NOTE — Patient Instructions (Addendum)
[name], It was a pleasure seeing you today!   Today we discussed your abdominal pain: I believe your symptoms may be due to acid reflux. You can continue taking the Tums as needed. I'd also like you to try Famotidine (Pepcid) 10 mg twice daily. If symptoms do not improve after 2 weeks, you can increase to 20 mg twice daily.   For your blood pressure,  I will not make any changes today since you have not started taking the Amlodipine yet.   For your kidney disease, you should hopefully be hearing about an appointment with [facility] soon.   I am checking your labs again today and will let you know if we need to make any changes to your medications.   Take care! Dr. Koleen Distance  Gastroesophageal Reflux Disease, Adult Gastroesophageal reflux (GER) happens when acid from the stomach flows up into the tube that connects the mouth and the stomach (esophagus). Normally, food travels down the esophagus and stays in the stomach to be digested. However, when a person has GER, food and stomach acid sometimes move back up into the esophagus. If this becomes a more serious problem, the person may be diagnosed with a disease called gastroesophageal reflux disease (GERD). GERD occurs when the reflux:  Happens often.  Causes frequent or severe symptoms.  Causes problems such as damage to the esophagus. When stomach acid comes in contact with the esophagus, the acid may cause soreness (inflammation) in the esophagus. Over time, GERD may create small holes (ulcers) in the lining of the esophagus. What are the causes? This condition is caused by a problem with the muscle between the esophagus and the stomach (lower esophageal sphincter, or LES). Normally, the LES muscle closes after food passes through the esophagus to the stomach. When the LES is weakened or abnormal, it does not close properly, and that allows food and stomach acid to go back up into the esophagus. The LES can be weakened by  certain dietary substances, medicines, and medical conditions, including:  Tobacco use.  Pregnancy.  Having a hiatal hernia.  Alcohol use.  Certain foods and beverages, such as coffee, chocolate, onions, and peppermint. What increases the risk? You are more likely to develop this condition if you:  Have an increased body weight.  Have a connective tissue disorder.  Use NSAID medicines. What are the signs or symptoms? Symptoms of this condition include:  Heartburn.  Difficult or painful swallowing.  The feeling of having a lump in the throat.  Abitter taste in the mouth.  Bad breath.  Having a large amount of saliva.  Having an upset or bloated stomach.  Belching.  Chest pain. Different conditions can cause chest pain. Make sure you see your health care provider if you experience chest pain.  Shortness of breath or wheezing.  Ongoing (chronic) cough or a night-time cough.  Wearing away of tooth enamel.  Weight loss. How is this diagnosed? Your health care provider will take a medical history and perform a physical exam. To determine if you have mild or severe GERD, your health care provider may also monitor how you respond to treatment. You may also have tests, including:  A test to examine your stomach and esophagus with a small camera (endoscopy).  A test thatmeasures the acidity level in your esophagus.  A test thatmeasures how much pressure is on your esophagus.  A barium swallow or modified barium swallow test to show the shape, size, and functioning of your esophagus. How  is this treated? The goal of treatment is to help relieve your symptoms and to prevent complications. Treatment for this condition may vary depending on how severe your symptoms are. Your health care provider may recommend:  Changes to your diet.  Medicine.  Surgery. Follow these instructions at home: Eating and drinking   Follow a diet as recommended by your health care  provider. This may involve avoiding foods and drinks such as: ? Coffee and tea (with or without caffeine). ? Drinks that containalcohol. ? Energy drinks and sports drinks. ? Carbonated drinks or sodas. ? Chocolate and cocoa. ? Peppermint and mint flavorings. ? Garlic and onions. ? Horseradish. ? Spicy and acidic foods, including peppers, chili powder, curry powder, vinegar, hot sauces, and barbecue sauce. ? Citrus fruit juices and citrus fruits, such as oranges, lemons, and limes. ? Tomato-based foods, such as red sauce, chili, salsa, and pizza with red sauce. ? Fried and fatty foods, such as donuts, french fries, potato chips, and high-fat dressings. ? High-fat meats, such as hot dogs and fatty cuts of red and white meats, such as rib eye steak, sausage, ham, and bacon. ? High-fat dairy items, such as whole milk, butter, and cream cheese.  Eat small, frequent meals instead of large meals.  Avoid drinking large amounts of liquid with your meals.  Avoid eating meals during the 2-3 hours before bedtime.  Avoid lying down right after you eat.  Do not exercise right after you eat. Lifestyle   Do not use any products that contain nicotine or tobacco, such as cigarettes, e-cigarettes, and chewing tobacco. If you need help quitting, ask your health care provider.  Try to reduce your stress by using methods such as yoga or meditation. If you need help reducing stress, ask your health care provider.  If you are overweight, reduce your weight to an amount that is healthy for you. Ask your health care provider for guidance about a safe weight loss goal. General instructions  Pay attention to any changes in your symptoms.  Take over-the-counter and prescription medicines only as told by your health care provider. Do not take aspirin, ibuprofen, or other NSAIDs unless your health care provider told you to do so.  Wear loose-fitting clothing. Do not wear anything tight around your waist  that causes pressure on your abdomen.  Raise (elevate) the head of your bed about 6 inches (15 cm).  Avoid bending over if this makes your symptoms worse.  Keep all follow-up visits as told by your health care provider. This is important. Contact a health care provider if:  You have: ? New symptoms. ? Unexplained weight loss. ? Difficulty swallowing or it hurts to swallow. ? Wheezing or a persistent cough. ? A hoarse voice.  Your symptoms do not improve with treatment. Get help right away if you:  Have pain in your arms, neck, jaw, teeth, or back.  Feel sweaty, dizzy, or light-headed.  Have chest pain or shortness of breath.  Vomit and your vomit looks like blood or coffee grounds.  Faint.  Have stool that is bloody or black.  Cannot swallow, drink, or eat. Summary  Gastroesophageal reflux happens when acid from the stomach flows up into the esophagus. GERD is a disease in which the reflux happens often, causes frequent or severe symptoms, or causes problems such as damage to the esophagus.  Treatment for this condition may vary depending on how severe your symptoms are. Your health care provider may recommend diet and lifestyle changes,  medicine, or surgery.  Contact a health care provider if you have new or worsening symptoms.  Take over-the-counter and prescription medicines only as told by your health care provider. Do not take aspirin, ibuprofen, or other NSAIDs unless your health care provider told you to do so.  Keep all follow-up visits as told by your health care provider. This is important. This information is not intended to replace advice given to you by your health care provider. Make sure you discuss any questions you have with your health care provider. Document Released: 12/17/2004 Document Revised: 09/15/2017 Document Reviewed: 09/15/2017 Elsevier Patient Education  2020 [employer].

## 2018-11-17 NOTE — Progress Notes (Signed)
   CC: HTN, abdominal pain   HPI:  [name] is a 68 y.o. female with PMHx listed below who presents for follow-up on HTN and abdominal pain. Please see problem based charting for further details.   Past Medical History:  Diagnosis Date  . Arthritis    DJD, lumbar spondylosis  . Asthma   . Back pain   . Bilateral renal cysts   . Breast cancer ([place])    left breast invasive ductal ca in remission as of 10/07/11 s/p double mastectomy  . Bronchitis 03/10/2012  . Cataract   . Chemotherapy follow-up examination 03/06/2011  . Colon polyps   . Cough 03/06/2011  . Depression    seasonal melancholia  . E. coli UTI   . Edema extremities   . GERD (gastroesophageal reflux disease)   . Hyperlipidemia   . Hypertension   . Ileitis   . Kidney stones    non obstructive   . Myopathy    not statin related, cause unknown    Review of Systems:   Review of Systems  Constitutional: Negative for chills, fever and weight loss.  Eyes: Negative for blurred vision and double vision.  Respiratory: Negative for shortness of breath.   Cardiovascular: Negative for chest pain and palpitations.  Gastrointestinal: Positive for abdominal pain and heartburn. Negative for constipation, diarrhea, melena and vomiting.  Genitourinary: Negative for dysuria and hematuria.  Neurological: Negative for dizziness and headaches.    Physical Exam:  Vitals:   11/17/18 1040  BP: (!) 158/92  Pulse: 90  Temp: 98.5 F (36.9 C)  TempSrc: Oral  SpO2: 100%  Weight: 234 lb 12.8 oz (106.5 kg)  Height: 5\' 8"  (7.017 m)   General: alert, well-appearing, pleasant female in NAD CV: RRR; no m/r/g Pulm: normal work of breathing; lungs CTAB  Abd: BS+; abdomen is soft, non-distended, mild epigastric tenderness on palpation Psych: normal mood and affect   Assessment & Plan:   See Encounters Tab for problem based charting.  Patient discussed with Dr. Dareen Piano

## 2018-11-18 LAB — BMP8+ANION GAP
Anion Gap: 18 mmol/L (ref 10.0–18.0)
BUN/Creatinine Ratio: 17 (ref 12–28)
BUN: 29 mg/dL — ABNORMAL HIGH (ref 8–27)
CO2: 20 mmol/L (ref 20–29)
Calcium: 10 mg/dL (ref 8.7–10.3)
Chloride: 104 mmol/L (ref 96–106)
Creatinine, Ser: 1.68 mg/dL — ABNORMAL HIGH (ref 0.57–1.00)
GFR calc Af Amer: 35 mL/min/{1.73_m2} — ABNORMAL LOW (ref >59)
GFR calc non Af Amer: 31 mL/min/{1.73_m2} — ABNORMAL LOW (ref >59)
Glucose: 125 mg/dL — ABNORMAL HIGH (ref 65–99)
Potassium: 4.6 mmol/L (ref 3.5–5.2)
Sodium: 142 mmol/L (ref 134–144)

## 2018-11-20 ENCOUNTER — Encounter: Payer: Self-pay | Admitting: Internal Medicine

## 2018-11-20 NOTE — Assessment & Plan Note (Signed)
Creatinine remains stable since last visit. Hyperkalemia resolved as above.  Referral being processed through [facility] and should hopefully be scheduled for an appointment in the next week or 2.

## 2018-11-20 NOTE — Assessment & Plan Note (Signed)
Patient presents for 1 week follow-up for blood pressure check. She has not been able to start the Amlodipine yet due to mail order pharmacy. Blood pressure persistently elevated, but she remains asymptomatic. She brought her home BP cuff in for comparison which was consistent when compared to cuff in clinic.  Will not make any medication changes today.  Repeat BMP shows resolution of hyperkalemia since discontinuing aldactone.

## 2018-11-20 NOTE — Progress Notes (Signed)
Internal Medicine Clinic Attending  Case discussed with Dr. Bloomfield at the time of the visit.  We reviewed the resident's history and exam and pertinent patient test results.  I agree with the assessment, diagnosis, and plan of care documented in the resident's note.  

## 2018-11-20 NOTE — Assessment & Plan Note (Signed)
Patient endorses increased symptoms the last few days. She gets relief with Tums. No red flag symptoms such as weight loss, dysphagia or change in stools.  She has not been taking Famotidine that has been prescribed prn. Will try daily Famotidine 10 mg BID. Advised that she can increase to 20 mg BID if symptoms are not improved after 2 weeks.

## 2018-11-21 NOTE — Addendum Note (Signed)
Addended by: Modena Nunnery D on: 11/21/2018 08:29 AM   Modules accepted: Level of Service

## 2018-12-01 ENCOUNTER — Ambulatory Visit (INDEPENDENT_AMBULATORY_CARE_PROVIDER_SITE_OTHER): Payer: Medicare Other | Admitting: *Deleted

## 2018-12-01 ENCOUNTER — Other Ambulatory Visit: Payer: Self-pay

## 2018-12-01 ENCOUNTER — Ambulatory Visit (HOSPITAL_COMMUNITY)
Admission: RE | Admit: 2018-12-01 | Discharge: 2018-12-01 | Disposition: A | Discharge status: Home / Self Care | Payer: Medicare Other | Source: Ambulatory Visit | Attending: Internal Medicine | Admitting: Internal Medicine

## 2018-12-01 DIAGNOSIS — N183 Chronic kidney disease, stage 3 unspecified: Secondary | ICD-10-CM

## 2018-12-01 DIAGNOSIS — Z23 Encounter for immunization: Secondary | ICD-10-CM

## 2018-12-07 ENCOUNTER — Other Ambulatory Visit: Payer: Self-pay

## 2018-12-07 ENCOUNTER — Encounter: Payer: Self-pay | Admitting: Internal Medicine

## 2018-12-07 ENCOUNTER — Ambulatory Visit (INDEPENDENT_AMBULATORY_CARE_PROVIDER_SITE_OTHER): Payer: Medicare Other | Admitting: Internal Medicine

## 2018-12-07 VITALS — BP 154/93 | HR 86 | Temp 99.3°F | Ht 68.0 in | Wt 235.0 lb

## 2018-12-07 DIAGNOSIS — G629 Polyneuropathy, unspecified: Secondary | ICD-10-CM | POA: Insufficient documentation

## 2018-12-07 DIAGNOSIS — E1142 Type 2 diabetes mellitus with diabetic polyneuropathy: Secondary | ICD-10-CM

## 2018-12-07 DIAGNOSIS — Z79899 Other long term (current) drug therapy: Secondary | ICD-10-CM

## 2018-12-07 DIAGNOSIS — R600 Localized edema: Secondary | ICD-10-CM

## 2018-12-07 DIAGNOSIS — Z6835 Body mass index (BMI) 35.0-35.9, adult: Secondary | ICD-10-CM

## 2018-12-07 DIAGNOSIS — Z23 Encounter for immunization: Secondary | ICD-10-CM

## 2018-12-07 DIAGNOSIS — Z9221 Personal history of antineoplastic chemotherapy: Secondary | ICD-10-CM

## 2018-12-07 DIAGNOSIS — I5030 Unspecified diastolic (congestive) heart failure: Secondary | ICD-10-CM | POA: Diagnosis not present

## 2018-12-07 IMAGING — US US ABDOMEN LIMITED
1 series · 14 of 25 positions shown · non-contrast
Comparison: None.

CLINICAL DATA: RIGHT upper quadrant pain and bloating. History of
breast cancer, hypertension.

EXAM:
ULTRASOUND ABDOMEN LIMITED RIGHT UPPER QUADRANT

[Series 1: us abdomen limited · 14 of 66 slices shown]
[im 1/66]
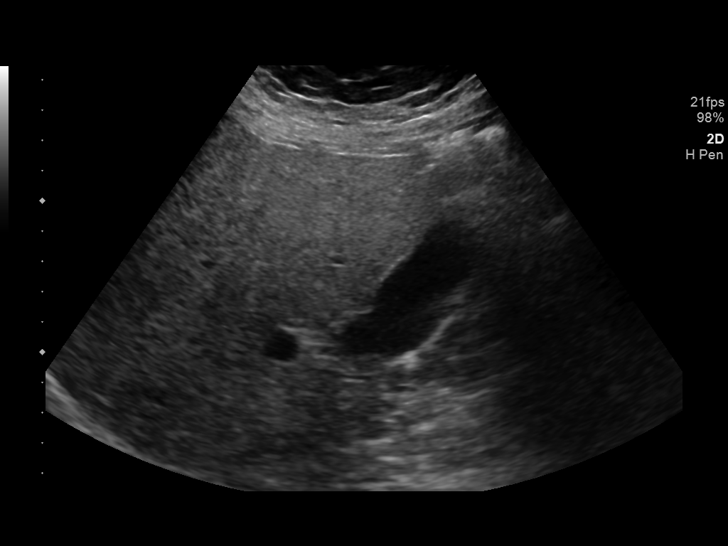
[im 6/66]
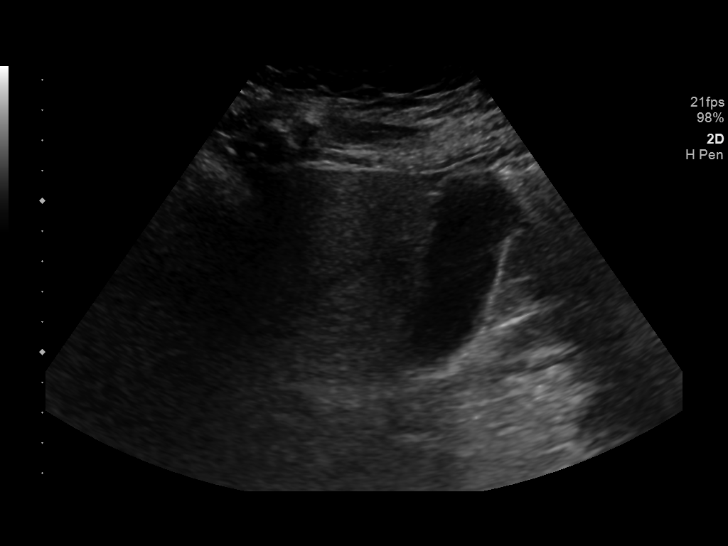
[im 11/66]
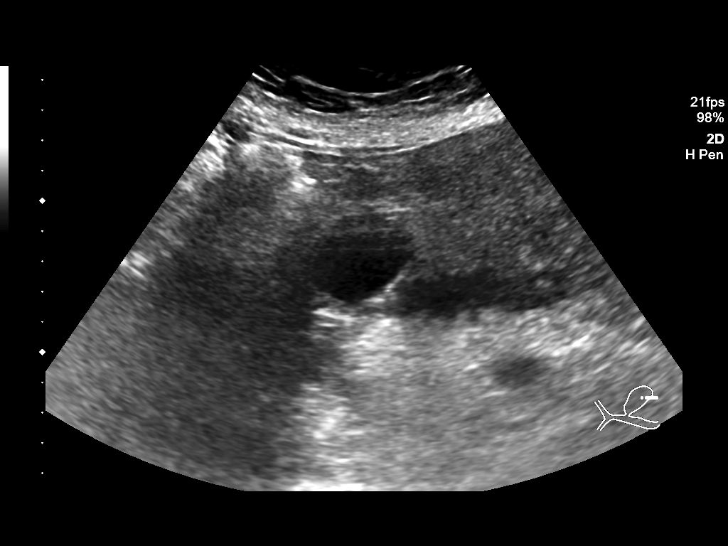
[im 17/66]
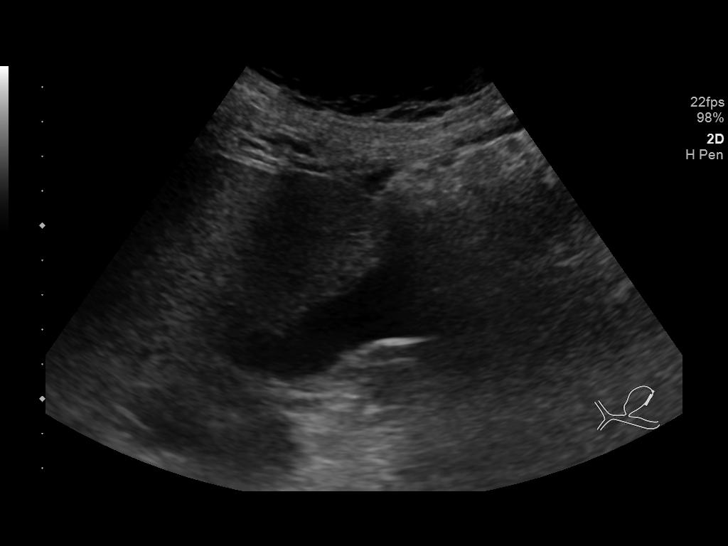
[im 22/66]
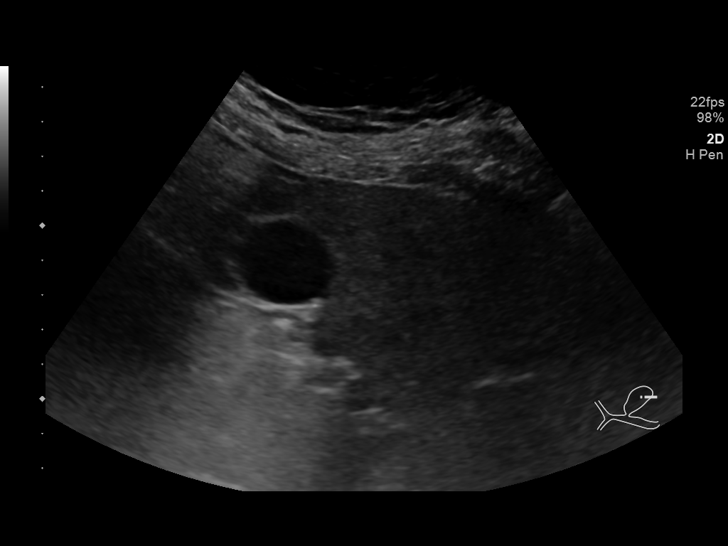
[im 25/66]
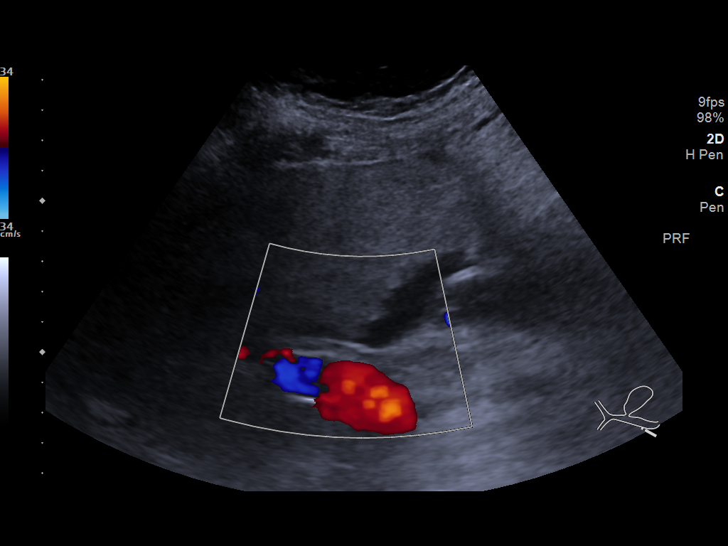
[im 30/66]
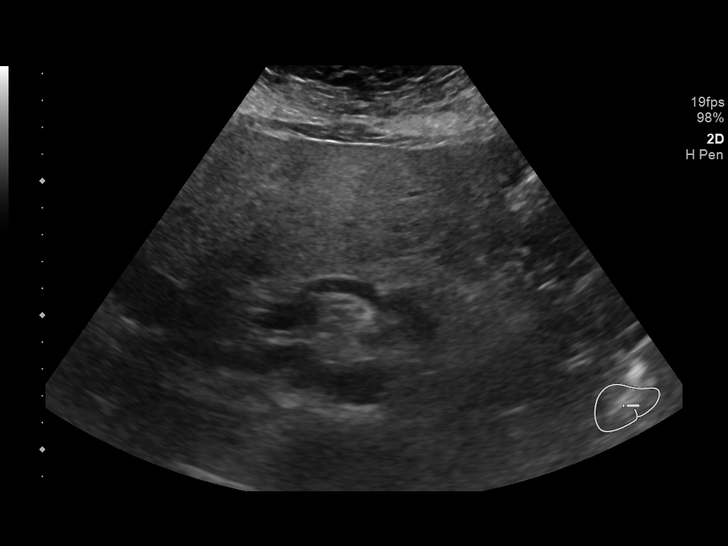
[im 36/66]
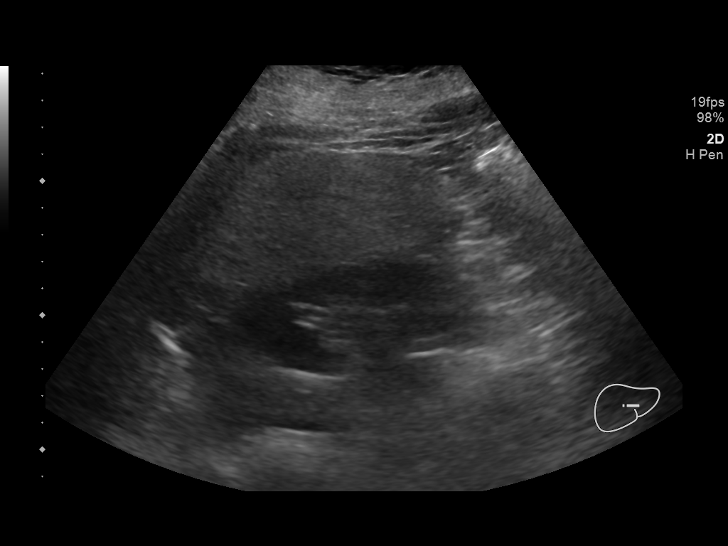
[im 41/66]
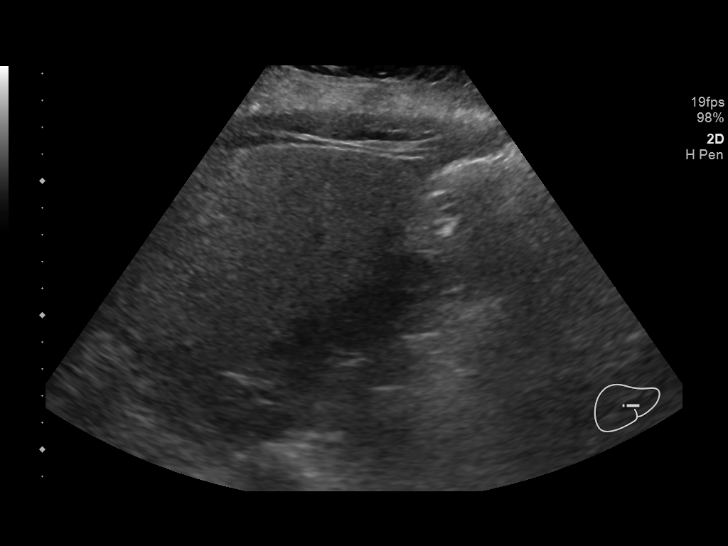
[im 44/66]
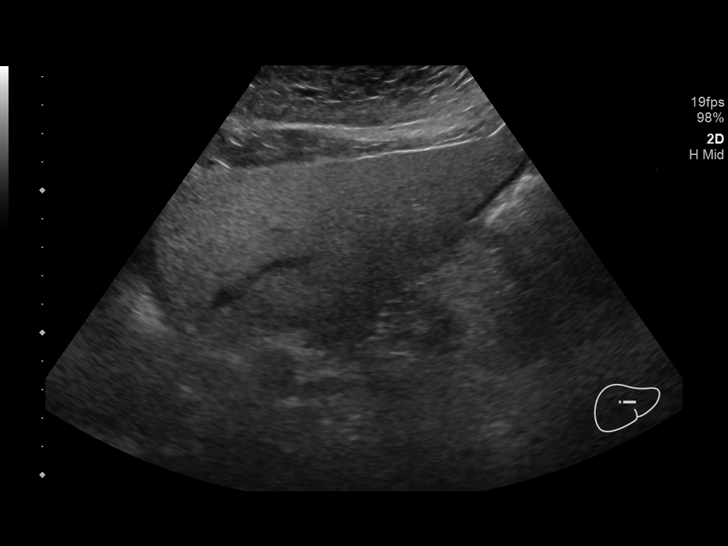
[im 49/66]
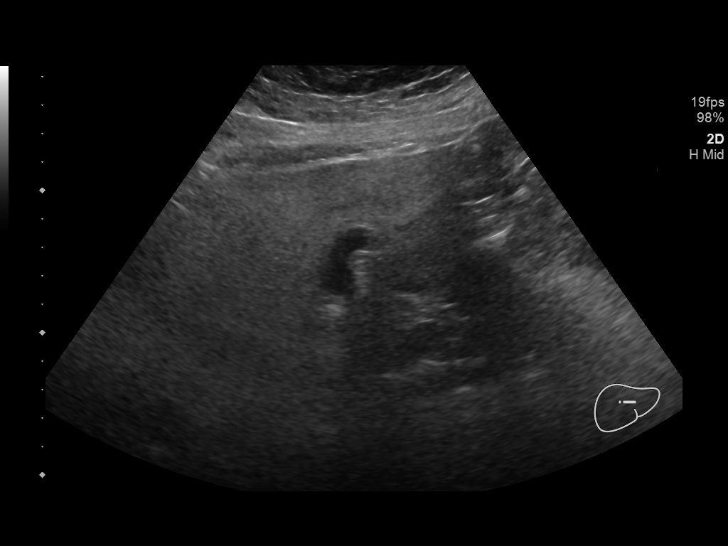
[im 55/66]
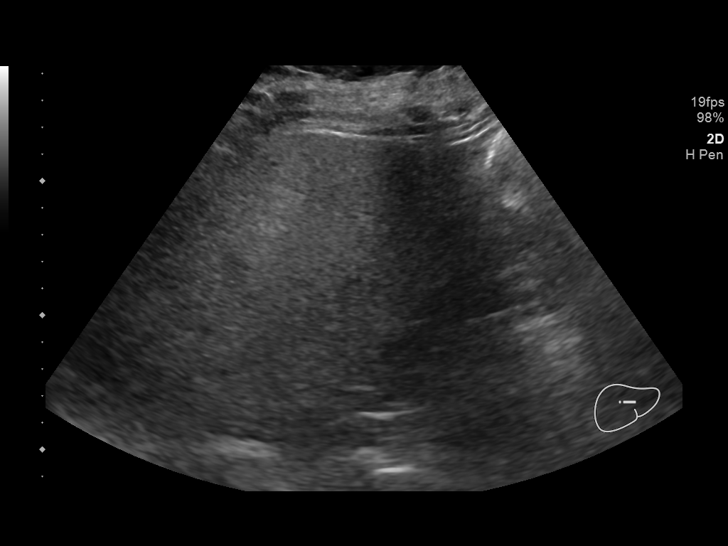
[im 60/66]
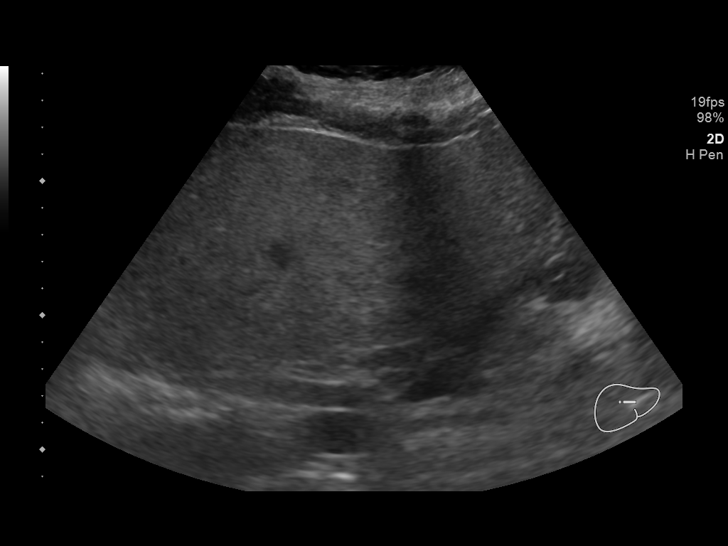
[im 66/66]
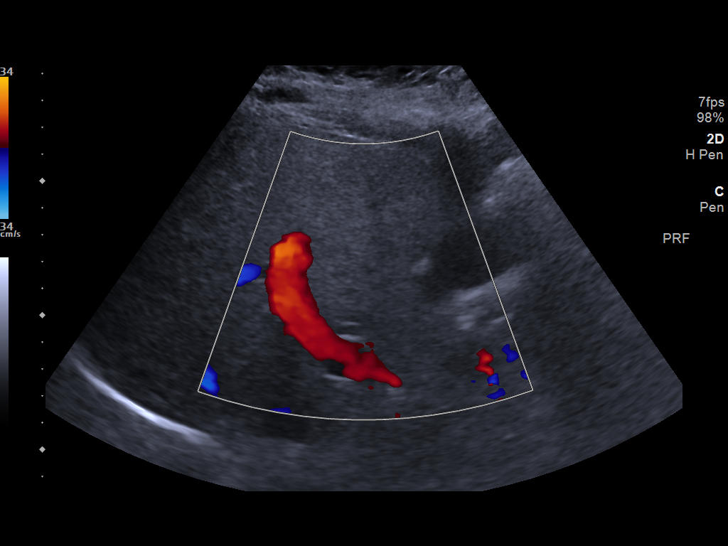

[14 of 25 positions shown; findings below may reference images not displayed]

FINDINGS: Gallbladder:

No gallstones or wall thickening visualized. No sonographic Murphy
sign noted by sonographer.

Common bile duct:

Diameter: 4 mm

Liver:

Liver is diffusely echogenic indicating fatty infiltration. No focal
lesion identified. Portal vein is patent on color Doppler imaging
with normal direction of blood flow towards the liver.
IMPRESSION: 1. No acute findings. Gallbladder appears normal. No bile duct
dilatation.
2. Fatty infiltration of the liver.

## 2018-12-07 MED ORDER — GABAPENTIN 100 MG PO CAPS: 100.0000 mg | ORAL_CAPSULE | Freq: Every day | ORAL | 0 refills | Status: DC

## 2018-12-07 MED ORDER — ZOSTER VAC RECOMB ADJUVANTED 50 MCG/0.5ML IM SUSR: 0.5000 mL | Freq: Once | INTRAMUSCULAR | 0 refills | Status: AC

## 2018-12-07 NOTE — Progress Notes (Signed)
CC: LE edema, Bilateral foot neuropathy  HPI:  [name] is a 68 y.o. female with PMH below.  Today we will address LE edema, Bilateral foot neuropathy  Please see A&P for status of the patient's chronic medical conditions  Past Medical History:  Diagnosis Date  . Arthritis    DJD, lumbar spondylosis  . Asthma   . Back pain   . Bilateral renal cysts   . Breast cancer ([place])    left breast invasive ductal ca in remission as of 10/07/11 s/p double mastectomy  . Bronchitis 03/10/2012  . Cataract   . Chemotherapy follow-up examination 03/06/2011  . Colon polyps   . Cough 03/06/2011  . Depression    seasonal melancholia  . E. coli UTI   . Edema extremities   . GERD (gastroesophageal reflux disease)   . Hyperlipidemia   . Hypertension   . Ileitis   . Kidney stones    non obstructive   . Myopathy    not statin related, cause unknown    Review of Systems:  ROS: Pulmonary: pt denies increased work of breathing, shortness of breath,  Cardiac: pt denies palpitations, chest pain,  Abdominal: pt denies abdominal pain, nausea, vomiting, or diarrhea  Physical Exam:  Vitals:   12/07/18 1328  BP: (!) 154/93  Pulse: 86  Temp: 99.3 F (37.4 C)  TempSrc: Oral  SpO2: 100%  Weight: 235 lb (106.6 kg)  Height: 5\' 8"  (1.727 m)   Cardiac: JVD flat, normal rate and rhythm, clear s1 and s2, no murmurs, rubs or gallops, trace LE edema bilateral anterior tibia trace-1+ ankle and foot edema bilaterally, 2+ DP/ PT pulses Pulmonary: CTAB, not in distress Abdominal: non distended abdomen, soft and nontender Psych: Alert, conversant, in good spirits Neuro: Her sensation to light touch, sharp, vibratory sensation all intact today in bilateral lower extremities and feet   Social History   Socioeconomic History  . Marital status: Single    Spouse name: Not on file  . Number of children: Not on file  . Years of education: Not on file  . Highest education level: Not on file   Occupational History  . Not on file  Social Needs  . Financial resource strain: Not on file  . Food insecurity    Worry: Not on file    Inability: Not on file  . Transportation needs    Medical: Not on file    Non-medical: Not on file  Tobacco Use  . Smoking status: Never Smoker  . Smokeless tobacco: Never Used  Substance and Sexual Activity  . Alcohol use: No    Alcohol/week: 0.0 standard drinks  . Drug use: No  . Sexual activity: Not on file  Lifestyle  . Physical activity    Days per week: Not on file    Minutes per session: Not on file  . Stress: Not on file  Relationships  . Social Herbalist on phone: Not on file    Gets together: Not on file    Attends religious service: Not on file    Active member of club or organization: Not on file    Attends meetings of clubs or organizations: Not on file    Relationship status: Not on file  . Intimate partner violence    Fear of current or ex partner: Not on file    Emotionally abused: Not on file    Physically abused: Not on file    Forced sexual activity:  Not on file  Other Topics Concern  . Not on file  Social History Narrative   Single, 2 kids   Never smoked   No alcohol use   No drugs   Laid off from [employer] after working there for 14 years.     Family History  Problem Relation Age of Onset  . Heart disease Mother 31       CAD  . Breast cancer Mother   . Cancer Mother 63       Breast cancer, mother  . Cancer Father 42       colon cancer dx'ed age 28  . Diabetes Father   . Coronary artery disease Other   . Multiple sclerosis Daughter   . Diabetes Brother   . Diabetes Brother     Assessment & Plan:   See Encounters Tab for problem based charting.  Patient discussed with Dr. Evette Doffing

## 2018-12-07 NOTE — Assessment & Plan Note (Signed)
Possibly multifactorial has history of diastolic heart failure seems to been worse when undergoing chemotherapy used to be followed by Dr. Kae Heller in the heart failure clinic has not been followed for some time.  Was on furosemide in the past years ago can't remember why they stopped it.  Does have sleep apnea but was told it wasn't bad enough to need cpap machine.  She is morbidly obese, there may be an element of venous stasis.  At one time they thought it may have been related to her amlodipine use however this was decreased to 5 mg sometime ago with little change.  I gave her the option of compression stockings versus PRN Lasix. she is very adamant today that she wishes not to add another medication like furosemide even on an as needed basis.    -We will get her set up with compression hose and follow-up on her progress

## 2018-12-07 NOTE — Patient Instructions (Signed)
Ms. Reinecke we will start with compression stockings and a medicine for the numbness in your feet called gabapentin.  We will also get some more labwork to help determine the cause of your neuropathy.

## 2018-12-07 NOTE — Assessment & Plan Note (Addendum)
Patient with development of neuropathy-like sensation in bilateral plantar surfaces of her feet seems to be present for a few months but more intermittent however has worsened in the last few weeks.  She is a well-controlled diabetic.  Her peripheral pulses, sensation to light touch, sharp, vibratory sensation all intact today.  She has recent labs with normal B12, negative ANA. If workup negative this may represent diabetic neuropathy vs chemotherapy induced neuropathy.  -To complete the work-up I will check a TSH, SPEP/IFE/light chains with her B12 is high as it was I will defer B6 -We will trial her on low-dose gabapentin she will start at night she can begin taking it during the day after a week or so if no side effects

## 2018-12-08 NOTE — Progress Notes (Signed)
Internal Medicine Clinic Attending  Case discussed with Dr. Winfrey  at the time of the visit.  We reviewed the resident's history and exam and pertinent patient test results.  I agree with the assessment, diagnosis, and plan of care documented in the resident's note.  

## 2018-12-13 LAB — MULTIPLE MYELOMA PANEL, SERUM
Albumin SerPl Elph-Mcnc: 3.9 g/dL (ref 2.9–4.4)
Albumin/Glob SerPl: 1.3 (ref 0.7–1.7)
Alpha 1: 0.2 g/dL (ref 0.0–0.4)
Alpha2 Glob SerPl Elph-Mcnc: 0.8 g/dL (ref 0.4–1.0)
B-Globulin SerPl Elph-Mcnc: 1.1 g/dL (ref 0.7–1.3)
Gamma Glob SerPl Elph-Mcnc: 1 g/dL (ref 0.4–1.8)
Globulin, Total: 3.1 g/dL (ref 2.2–3.9)
IgA/Immunoglobulin A, Serum: 184 mg/dL (ref 87–352)
IgG (Immunoglobin G), Serum: 1187 mg/dL (ref 586–1602)
IgM (Immunoglobulin M), Srm: 55 mg/dL (ref 26–217)
Total Protein: 7 g/dL (ref 6.0–8.5)

## 2018-12-13 LAB — KAPPA/LAMBDA LIGHT CHAINS
Ig Kappa Free Light Chain: 27.8 mg/L — ABNORMAL HIGH (ref 3.3–19.4)
Ig Lambda Free Light Chain: 13.5 mg/L (ref 5.7–26.3)
Kappa/Lambda FluidC Ratio: 2.06 — ABNORMAL HIGH (ref 0.26–1.65)

## 2018-12-13 LAB — TSH: TSH: 2.02 u[IU]/mL (ref 0.450–4.500)

## 2018-12-14 ENCOUNTER — Other Ambulatory Visit: Payer: Self-pay | Admitting: Internal Medicine

## 2018-12-14 DIAGNOSIS — N183 Chronic kidney disease, stage 3 unspecified: Secondary | ICD-10-CM

## 2018-12-14 NOTE — Addendum Note (Signed)
Addended by: Yvonna Alanis E on: 12/14/2018 06:00 PM   Modules accepted: Orders

## 2018-12-14 NOTE — Progress Notes (Signed)
Discussed recent renal US results with her and recommended following up with nephrology. Recent referral was to be placed but may not have gone through and will place this again. She will f/u in one month in clinic.   Molli Hazard A, DO 12/14/2018, 8:59 AM Pager: [phone]

## 2018-12-19 ENCOUNTER — Telehealth: Payer: Self-pay | Admitting: Internal Medicine

## 2018-12-19 DIAGNOSIS — G629 Polyneuropathy, unspecified: Secondary | ICD-10-CM

## 2018-12-19 MED ORDER — GABAPENTIN 100 MG PO CAPS: ORAL_CAPSULE | ORAL | 0 refills | Status: DC

## 2018-12-19 NOTE — Telephone Encounter (Signed)
Went over neuropathy workup with patient.  We will treat this as diabetic neuropathy.  Unfortunately mail order pharmacy has not yet delivered her gabapentin so I will send this to her local [facility] pharmacy.

## 2018-12-22 ENCOUNTER — Telehealth: Payer: Self-pay | Admitting: Internal Medicine

## 2018-12-22 ENCOUNTER — Encounter: Payer: Self-pay | Admitting: Internal Medicine

## 2018-12-22 DIAGNOSIS — R768 Other specified abnormal immunological findings in serum: Secondary | ICD-10-CM | POA: Insufficient documentation

## 2018-12-22 NOTE — Progress Notes (Signed)
After reviewing and further reading I think it would be prudent to follow up this result with urine studies for free light chains as well, upep and uife. I do not think this is the cause of her neuropathy and it is reassuring that the ratio is only 2.06.  She may need periodic monitoring of her light chains.  Will route to her pcp to collect urine studies at next pcp visit.  I will update the problem list

## 2018-12-22 NOTE — Telephone Encounter (Signed)
Opened telephone encounter by mistake.  Used result note instead to route info on light chains monitoring to pcp

## 2018-12-26 ENCOUNTER — Telehealth: Payer: Self-pay | Admitting: Internal Medicine

## 2018-12-26 NOTE — Telephone Encounter (Signed)
Checking on referral; pls contact [place]

## 2018-12-26 NOTE — Telephone Encounter (Signed)
Appt Has been sch with [facility] for 02/08/2019 per the patient with Dr. Lorrene Reid

## 2018-12-26 NOTE — Telephone Encounter (Signed)
Called [facility].  A message has been left for the patient to call and schedule an appointment.  Spoke with the patient this afternoon.  Pt to call our office back and let us know when she is sch.

## 2019-01-11 ENCOUNTER — Ambulatory Visit (INDEPENDENT_AMBULATORY_CARE_PROVIDER_SITE_OTHER): Payer: Medicare Other | Admitting: Internal Medicine

## 2019-01-11 ENCOUNTER — Other Ambulatory Visit: Payer: Self-pay

## 2019-01-11 ENCOUNTER — Encounter: Payer: Self-pay | Admitting: Internal Medicine

## 2019-01-11 VITALS — BP 146/89 | HR 89 | Temp 98.4°F | Wt 234.7 lb

## 2019-01-11 DIAGNOSIS — J45991 Cough variant asthma: Secondary | ICD-10-CM

## 2019-01-11 DIAGNOSIS — G629 Polyneuropathy, unspecified: Secondary | ICD-10-CM

## 2019-01-11 DIAGNOSIS — Z7984 Long term (current) use of oral hypoglycemic drugs: Secondary | ICD-10-CM

## 2019-01-11 DIAGNOSIS — I152 Hypertension secondary to endocrine disorders: Secondary | ICD-10-CM

## 2019-01-11 DIAGNOSIS — I129 Hypertensive chronic kidney disease with stage 1 through stage 4 chronic kidney disease, or unspecified chronic kidney disease: Secondary | ICD-10-CM

## 2019-01-11 DIAGNOSIS — E1142 Type 2 diabetes mellitus with diabetic polyneuropathy: Secondary | ICD-10-CM | POA: Diagnosis not present

## 2019-01-11 DIAGNOSIS — Z9189 Other specified personal risk factors, not elsewhere classified: Secondary | ICD-10-CM

## 2019-01-11 DIAGNOSIS — I10 Essential (primary) hypertension: Secondary | ICD-10-CM

## 2019-01-11 DIAGNOSIS — E1122 Type 2 diabetes mellitus with diabetic chronic kidney disease: Secondary | ICD-10-CM | POA: Diagnosis not present

## 2019-01-11 DIAGNOSIS — M7989 Other specified soft tissue disorders: Secondary | ICD-10-CM

## 2019-01-11 DIAGNOSIS — Z0000 Encounter for general adult medical examination without abnormal findings: Secondary | ICD-10-CM

## 2019-01-11 DIAGNOSIS — R768 Other specified abnormal immunological findings in serum: Secondary | ICD-10-CM

## 2019-01-11 DIAGNOSIS — N1832 Chronic kidney disease, stage 3b: Secondary | ICD-10-CM

## 2019-01-11 DIAGNOSIS — E1159 Type 2 diabetes mellitus with other circulatory complications: Secondary | ICD-10-CM

## 2019-01-11 DIAGNOSIS — Z79899 Other long term (current) drug therapy: Secondary | ICD-10-CM

## 2019-01-11 DIAGNOSIS — E119 Type 2 diabetes mellitus without complications: Secondary | ICD-10-CM

## 2019-01-11 DIAGNOSIS — N183 Chronic kidney disease, stage 3 unspecified: Secondary | ICD-10-CM

## 2019-01-11 DIAGNOSIS — K219 Gastro-esophageal reflux disease without esophagitis: Secondary | ICD-10-CM

## 2019-01-11 LAB — POCT GLYCOSYLATED HEMOGLOBIN (HGB A1C): Hemoglobin A1C: 6.7 % — AB (ref 4.0–5.6)

## 2019-01-11 LAB — GLUCOSE, CAPILLARY: Glucose-Capillary: 136 mg/dL — ABNORMAL HIGH (ref 70–99)

## 2019-01-11 MED ORDER — HYDRALAZINE HCL 25 MG PO TABS: 75.0000 mg | ORAL_TABLET | Freq: Two times a day (BID) | ORAL | 2 refills | Status: DC

## 2019-01-11 NOTE — Progress Notes (Signed)
   CC: neuropathy  HPI:  [name] is a 68 y.o. with PMH as below.   Please see A&P for assessment of the patient's acute and chronic medical conditions.    Past Medical History:  Diagnosis Date  . Arthritis    DJD, lumbar spondylosis  . Asthma   . Back pain   . Bilateral renal cysts   . Breast cancer ([place])    left breast invasive ductal ca in remission as of 10/07/11 s/p double mastectomy  . Bronchitis 03/10/2012  . Cataract   . Chemotherapy follow-up examination 03/06/2011  . Colon polyps   . Cough 03/06/2011  . Depression    seasonal melancholia  . E. coli UTI   . Edema extremities   . GERD (gastroesophageal reflux disease)   . Hyperlipidemia   . Hypertension   . Ileitis   . Kidney stones    non obstructive   . Myopathy    not statin related, cause unknown    Review of Systems:   Review of Systems  Respiratory: Positive for wheezing (sometimes at night). Negative for cough and shortness of breath.   Cardiovascular: Positive for leg swelling. Negative for chest pain, palpitations and orthopnea.  Gastrointestinal: Negative for abdominal pain, constipation, diarrhea, heartburn, nausea and vomiting.  Genitourinary: Negative for dysuria, frequency and urgency.  Neurological: Negative for dizziness, tingling, tremors and sensory change.    Physical Exam:  Constitution: NAD, appears stated age  63: RRR, no m/r/g, trace edema, non-pitting  Respiratory: CTA, no wheezing rales or rhonchi Abdominal: soft, non-distended, NTTP  Skin: c/d/i    Vitals:   01/11/19 1321 01/11/19 1353  BP: (!) 152/88 (!) 146/89  Pulse: 99 89  Temp: 98.4 F (36.9 C)   TempSrc: Oral   SpO2: 100%   Weight: 234 lb 11.2 oz (106.5 kg)      Assessment & Plan:   See Encounters Tab for problem based charting.  Patient discussed with Dr. Lynnae January

## 2019-01-11 NOTE — Patient Instructions (Addendum)
Thank you for allowing [place] to provide your care today. Today we discussed your high blood pressure, type II diabetes, and chronic kidney disease.     I have ordered the following labs for you:  Basic metabolic panel,    I will call if any are abnormal.    Today we made the following changes to your medications:   Please START taking  Hydralazine 75 mg - take 3 tablets every 12 hours.    Please follow-up in one month.    I have ordered a dexa scan to measure bone density. They will contact you to schedule an appointment time.   Please call the internal medicine center clinic if you have any questions or concerns, we may be able to help and keep you from a long and expensive emergency room wait. Our clinic and after hours phone number is [phone], the best time to call is Monday through Friday 9 am to 4 pm but there is always someone available 24/7 if you have an emergency. If you need medication refills please notify your pharmacy one week in advance and they will send [place] a request.

## 2019-01-12 LAB — RENAL FUNCTION PANEL
Albumin: 4.6 g/dL (ref 3.8–4.8)
BUN/Creatinine Ratio: 16 (ref 12–28)
BUN: 21 mg/dL (ref 8–27)
CO2: 24 mmol/L (ref 20–29)
Calcium: 10.3 mg/dL (ref 8.7–10.3)
Chloride: 104 mmol/L (ref 96–106)
Creatinine, Ser: 1.34 mg/dL — ABNORMAL HIGH (ref 0.57–1.00)
GFR calc Af Amer: 46 mL/min/{1.73_m2} — ABNORMAL LOW (ref >59)
GFR calc non Af Amer: 40 mL/min/{1.73_m2} — ABNORMAL LOW (ref >59)
Glucose: 120 mg/dL — ABNORMAL HIGH (ref 65–99)
Phosphorus: 3.1 mg/dL (ref 3.0–4.3)
Potassium: 4.2 mmol/L (ref 3.5–5.2)
Sodium: 142 mmol/L (ref 134–144)

## 2019-01-15 NOTE — Assessment & Plan Note (Signed)
She has an appointment with [facility] 11/18.   - RFP today  - f/u after nephrology appointment

## 2019-01-15 NOTE — Assessment & Plan Note (Signed)
Recently started on gabapentin for LE neuropathy. She states the neuropathy seems to have improved on its own and is not bothering her much recently. The gabapentin helped slightly and she is only taking it whenever she needs it.    - cont. Gabapentin 100 mg qhs prn

## 2019-01-15 NOTE — Assessment & Plan Note (Signed)
BP Readings from Last 3 Encounters:  01/11/19 (!) 146/89  12/07/18 (!) 154/93  11/17/18 (!) 158/92   Blood pressure ranges from normal up to 150s sometimes at home. Repeat today still >150/90. Medications include coreg 12.5 mg bid, hydral 50 mg bid, olmesartan 40 mg qd. And norvasc 5 mg. She has a history of LE edema with higher norvasc dose.    - increase hydalazine to 75 mg bid  - BMP today  - f/u one month for BP check

## 2019-01-15 NOTE — Assessment & Plan Note (Signed)
Glucose has ranged from as high as 144, she checks TID before meals. Hemoglobin a1c 6.7 from 6.3 three months ago. Discussed glucose control and dietary management in addition to medications.   - f/u 3 months a1c  - cont. Metformin 1000 mg qam

## 2019-01-15 NOTE — Assessment & Plan Note (Signed)
Previous abdominal pain has resolved with taking famotidine more consistently. Continue.

## 2019-01-15 NOTE — Assessment & Plan Note (Signed)
She has been having some increased wheezing at night although no SOB. Her inhalers do continue to help. She plans to schedule a follow-up appointment with her pulmonologist.

## 2019-01-15 NOTE — Assessment & Plan Note (Signed)
Ordered dexa scan.  

## 2019-01-17 NOTE — Assessment & Plan Note (Signed)
  Kappa light chains mildly elevated with ratio 2.06, ordered during last visit as she has new onset neuropathy. Additionally has been having worsening CKD. She will see nephrology on November 18th.  SPEP was done which was unremarkable. Although kappa/lambda ratio was low and may be 2/2 CKD, it will be necessary to obtain IFE to rule out monoclonal gammopathy.   - f/u future lab for UPEP/UIFE

## 2019-01-18 NOTE — Addendum Note (Signed)
Addended by: Molli Hazard A on: 01/18/2019 04:43 PM   Modules accepted: Orders

## 2019-01-18 NOTE — Progress Notes (Signed)
Internal Medicine Clinic Attending  Case discussed with Dr. Seawell at the time of the visit.  We reviewed the resident's history and exam and pertinent patient test results.  I agree with the assessment, diagnosis, and plan of care documented in the resident's note.    

## 2019-01-19 ENCOUNTER — Other Ambulatory Visit: Payer: Self-pay | Admitting: *Deleted

## 2019-01-19 DIAGNOSIS — I10 Essential (primary) hypertension: Secondary | ICD-10-CM

## 2019-01-19 DIAGNOSIS — R0789 Other chest pain: Secondary | ICD-10-CM

## 2019-01-19 DIAGNOSIS — M25561 Pain in right knee: Secondary | ICD-10-CM

## 2019-01-19 DIAGNOSIS — J302 Other seasonal allergic rhinitis: Secondary | ICD-10-CM

## 2019-01-19 DIAGNOSIS — I5032 Chronic diastolic (congestive) heart failure: Secondary | ICD-10-CM

## 2019-01-19 DIAGNOSIS — G629 Polyneuropathy, unspecified: Secondary | ICD-10-CM

## 2019-01-19 NOTE — Telephone Encounter (Signed)
Needs refills on all meds to be sent to  [facility]

## 2019-01-23 ENCOUNTER — Other Ambulatory Visit: Payer: Self-pay | Admitting: Internal Medicine

## 2019-01-23 DIAGNOSIS — I10 Essential (primary) hypertension: Secondary | ICD-10-CM

## 2019-01-23 MED ORDER — GABAPENTIN 100 MG PO CAPS: 100.0000 mg | ORAL_CAPSULE | Freq: Two times a day (BID) | ORAL | 2 refills | Status: DC

## 2019-01-23 MED ORDER — FLUTICASONE PROPIONATE 50 MCG/ACT NA SUSP: 2.0000 | Freq: Every day | NASAL | 1 refills | Status: DC

## 2019-01-23 MED ORDER — AMLODIPINE BESYLATE 5 MG PO TABS: 5.0000 mg | ORAL_TABLET | Freq: Every day | ORAL | 3 refills | Status: DC

## 2019-01-23 MED ORDER — CARVEDILOL 25 MG PO TABS: ORAL_TABLET | ORAL | 3 refills | Status: DC

## 2019-01-23 MED ORDER — DICLOFENAC SODIUM 1 % TD GEL: 4.0000 g | Freq: Four times a day (QID) | TRANSDERMAL | 1 refills | Status: DC | PRN

## 2019-01-23 MED ORDER — OLMESARTAN MEDOXOMIL 40 MG PO TABS: 40.0000 mg | ORAL_TABLET | Freq: Every evening | ORAL | 3 refills | Status: DC

## 2019-01-25 ENCOUNTER — Other Ambulatory Visit: Payer: Medicare Other

## 2019-01-25 ENCOUNTER — Other Ambulatory Visit: Payer: Self-pay

## 2019-01-25 DIAGNOSIS — R768 Other specified abnormal immunological findings in serum: Secondary | ICD-10-CM | POA: Diagnosis not present

## 2019-01-26 LAB — PROTEIN ELECTROPHORESIS, URINE REFLEX
Albumin ELP, Urine: 50.6 %
Alpha-1-Globulin, U: 2.2 %
Alpha-2-Globulin, U: 6.6 %
Beta Globulin, U: 24 %
Gamma Globulin, U: 16.7 %
Protein, Ur: 31 mg/dL

## 2019-01-26 LAB — IMMUNOFIXATION, URINE

## 2019-02-01 ENCOUNTER — Ambulatory Visit: Payer: Medicare Other | Admitting: Pulmonary Disease

## 2019-02-06 ENCOUNTER — Other Ambulatory Visit: Payer: Self-pay

## 2019-02-06 DIAGNOSIS — I10 Essential (primary) hypertension: Secondary | ICD-10-CM

## 2019-02-06 DIAGNOSIS — E785 Hyperlipidemia, unspecified: Secondary | ICD-10-CM

## 2019-02-06 DIAGNOSIS — I5032 Chronic diastolic (congestive) heart failure: Secondary | ICD-10-CM

## 2019-02-06 NOTE — Telephone Encounter (Signed)
metFORMIN (GLUCOPHAGE-XR) 500 MG 24 hr tablet  hydrALAZINE (APRESOLINE) 25 MG tablet, Per patient this med need to be 75 mg   simvastatin (ZOCOR) 20 MG tablet    carvedilol (COREG) 25 MG tablet   Refill request. Pt have question about carvedilol. Please call pt back.

## 2019-02-07 MED ORDER — SIMVASTATIN 20 MG PO TABS: 20.0000 mg | ORAL_TABLET | Freq: Every day | ORAL | 1 refills | Status: DC

## 2019-02-07 MED ORDER — METFORMIN HCL ER 500 MG PO TB24: 1000.0000 mg | ORAL_TABLET | Freq: Every day | ORAL | 1 refills | Status: DC

## 2019-02-07 MED ORDER — HYDRALAZINE HCL 25 MG PO TABS: 75.0000 mg | ORAL_TABLET | Freq: Two times a day (BID) | ORAL | 1 refills | Status: DC

## 2019-02-07 MED ORDER — CARVEDILOL 25 MG PO TABS: ORAL_TABLET | ORAL | 1 refills | Status: DC

## 2019-02-08 DIAGNOSIS — I129 Hypertensive chronic kidney disease with stage 1 through stage 4 chronic kidney disease, or unspecified chronic kidney disease: Secondary | ICD-10-CM | POA: Diagnosis not present

## 2019-02-08 DIAGNOSIS — N1832 Chronic kidney disease, stage 3b: Secondary | ICD-10-CM | POA: Diagnosis not present

## 2019-02-08 DIAGNOSIS — Z7689 Persons encountering health services in other specified circumstances: Secondary | ICD-10-CM | POA: Diagnosis not present

## 2019-02-08 DIAGNOSIS — D649 Anemia, unspecified: Secondary | ICD-10-CM | POA: Diagnosis not present

## 2019-02-08 DIAGNOSIS — N1330 Unspecified hydronephrosis: Secondary | ICD-10-CM | POA: Diagnosis not present

## 2019-02-08 DIAGNOSIS — E1122 Type 2 diabetes mellitus with diabetic chronic kidney disease: Secondary | ICD-10-CM | POA: Diagnosis not present

## 2019-02-10 ENCOUNTER — Telehealth: Payer: Self-pay | Admitting: *Deleted

## 2019-02-10 NOTE — Telephone Encounter (Signed)
Patient called in stating she received 90 day supply of carvedilol from [facility], however, the tabs are not scored and it is very difficult to cut the un-scored tabs in half. She is asking if instead of taking 0.5 tab BID if she can take 1 tab daily. Will route to PCP. Hubbard Hartshorn, BSN, RN-BC

## 2019-02-11 NOTE — Telephone Encounter (Signed)
Spoke with her and she was able to find a cutter for the coreg.

## 2019-02-21 ENCOUNTER — Other Ambulatory Visit: Payer: Self-pay | Admitting: Nephrology

## 2019-02-21 DIAGNOSIS — N1832 Chronic kidney disease, stage 3b: Secondary | ICD-10-CM

## 2019-02-22 ENCOUNTER — Encounter: Payer: Medicare Other | Admitting: Internal Medicine

## 2019-02-28 ENCOUNTER — Telehealth: Payer: Self-pay | Admitting: Internal Medicine

## 2019-02-28 ENCOUNTER — Other Ambulatory Visit: Payer: Self-pay | Admitting: Internal Medicine

## 2019-02-28 DIAGNOSIS — E2839 Other primary ovarian failure: Secondary | ICD-10-CM

## 2019-02-28 NOTE — Telephone Encounter (Signed)
Call made to patient-pcp has already placed order for bone density.  Pt given number to [place] [phone] and instructed to call to make her own appt.  Pt agreed. No further action needed, phone call complete.Despina Hidden Cassady12/8/202010:38 AM

## 2019-02-28 NOTE — Telephone Encounter (Signed)
Per physician wanted pt to have a bone destin test; pt unable to find a place, pls contact pt [phone]

## 2019-03-03 ENCOUNTER — Ambulatory Visit
Admission: RE | Admit: 2019-03-03 | Discharge: 2019-03-03 | Disposition: A | Discharge status: Home / Self Care | Payer: Medicare Other | Source: Ambulatory Visit | Attending: Nephrology | Admitting: Nephrology

## 2019-03-03 DIAGNOSIS — N1832 Chronic kidney disease, stage 3b: Secondary | ICD-10-CM

## 2019-03-03 DIAGNOSIS — N183 Chronic kidney disease, stage 3 unspecified: Secondary | ICD-10-CM | POA: Diagnosis not present

## 2019-03-20 DIAGNOSIS — N1330 Unspecified hydronephrosis: Secondary | ICD-10-CM | POA: Diagnosis not present

## 2019-03-20 DIAGNOSIS — I129 Hypertensive chronic kidney disease with stage 1 through stage 4 chronic kidney disease, or unspecified chronic kidney disease: Secondary | ICD-10-CM | POA: Diagnosis not present

## 2019-03-20 DIAGNOSIS — E1122 Type 2 diabetes mellitus with diabetic chronic kidney disease: Secondary | ICD-10-CM | POA: Diagnosis not present

## 2019-03-20 DIAGNOSIS — D649 Anemia, unspecified: Secondary | ICD-10-CM | POA: Diagnosis not present

## 2019-03-20 DIAGNOSIS — N1832 Chronic kidney disease, stage 3b: Secondary | ICD-10-CM | POA: Diagnosis not present

## 2019-03-29 ENCOUNTER — Encounter: Payer: Self-pay | Admitting: Internal Medicine

## 2019-03-29 ENCOUNTER — Ambulatory Visit (INDEPENDENT_AMBULATORY_CARE_PROVIDER_SITE_OTHER): Payer: Medicare Other | Admitting: Internal Medicine

## 2019-03-29 ENCOUNTER — Other Ambulatory Visit: Payer: Self-pay

## 2019-03-29 VITALS — BP 157/88 | HR 92 | Temp 98.6°F | Ht 68.0 in | Wt 236.1 lb

## 2019-03-29 DIAGNOSIS — E1159 Type 2 diabetes mellitus with other circulatory complications: Secondary | ICD-10-CM

## 2019-03-29 DIAGNOSIS — Z9012 Acquired absence of left breast and nipple: Secondary | ICD-10-CM

## 2019-03-29 DIAGNOSIS — I5032 Chronic diastolic (congestive) heart failure: Secondary | ICD-10-CM

## 2019-03-29 DIAGNOSIS — R768 Other specified abnormal immunological findings in serum: Secondary | ICD-10-CM

## 2019-03-29 DIAGNOSIS — E119 Type 2 diabetes mellitus without complications: Secondary | ICD-10-CM

## 2019-03-29 DIAGNOSIS — I152 Hypertension secondary to endocrine disorders: Secondary | ICD-10-CM

## 2019-03-29 DIAGNOSIS — I10 Essential (primary) hypertension: Secondary | ICD-10-CM

## 2019-03-29 DIAGNOSIS — G8918 Other acute postprocedural pain: Secondary | ICD-10-CM | POA: Diagnosis not present

## 2019-03-29 DIAGNOSIS — Z79899 Other long term (current) drug therapy: Secondary | ICD-10-CM

## 2019-03-29 DIAGNOSIS — Z7984 Long term (current) use of oral hypoglycemic drugs: Secondary | ICD-10-CM

## 2019-03-29 LAB — POCT GLYCOSYLATED HEMOGLOBIN (HGB A1C): Hemoglobin A1C: 6.8 % — AB (ref 4.0–5.6)

## 2019-03-29 LAB — GLUCOSE, CAPILLARY: Glucose-Capillary: 103 mg/dL — ABNORMAL HIGH (ref 70–99)

## 2019-03-29 MED ORDER — CARVEDILOL 25 MG PO TABS: ORAL_TABLET | ORAL | 5 refills | Status: DC

## 2019-03-29 NOTE — Progress Notes (Signed)
   CC: blood pressure check  HPI:  Ms.Aralyn C Dziuba is a 69 y.o. with PMH as below.   Please see A&P for assessment of the patient's acute and chronic medical conditions.   Past Medical History:  Diagnosis Date  . Arthritis    DJD, lumbar spondylosis  . Asthma   . Back pain   . Bilateral renal cysts   . Breast cancer ([place])    left breast invasive ductal ca in remission as of 10/07/11 s/p double mastectomy  . Bronchitis 03/10/2012  . Cataract   . Chemotherapy follow-up examination 03/06/2011  . Colon polyps   . Cough 03/06/2011  . Depression    seasonal melancholia  . E. coli UTI   . Edema extremities   . GERD (gastroesophageal reflux disease)   . Hyperlipidemia   . Hypertension   . Ileitis   . Kidney stones    non obstructive   . Myopathy    not statin related, cause unknown    Review of Systems:   Review of Systems  Constitutional: Negative for malaise/fatigue and weight loss.  Eyes: Negative for blurred vision and double vision.  Respiratory: Negative for cough and shortness of breath.   Cardiovascular: Negative for chest pain, orthopnea and leg swelling.  Gastrointestinal: Negative for abdominal pain, constipation, diarrhea and nausea.  Genitourinary: Negative for dysuria, frequency and urgency.  Neurological: Negative for dizziness and tingling.   Physical Exam:   Constitution: NAD, pleasant  Cardio: RRR, no m/r/g, no LE edema  Respiratory: CTA, no w/r/r Abdominal: NTTP, soft, non-distended MSK: moving all extremities, strength symmetrical  Neuro: normal affect, a&ox3 Skin: c/d/i    Vitals:   03/29/19 1348 03/29/19 1356  BP: (!) 171/99 (!) 157/88  Pulse: 91 92  Temp: 98.6 F (37 C)   TempSrc: Oral   SpO2: 100%   Weight: 236 lb 1.6 oz (107.1 kg)   Height: 5\' 8"  (1.727 m)      Assessment & Plan:   See Encounters Tab for problem based charting.  Patient discussed with Dr. Daryll Drown

## 2019-03-29 NOTE — Patient Instructions (Signed)
Thank you for allowing [place] to provide your care today. Today we discussed your high blood pressure.    I have ordered the following labs for you:    I will call if any are abnormal.    Today we made the following changes to your medications:   Please START taking  Coreg (carvedilol) 25 mg tablet - take one tablet two times per day, once in the morning and once in the evening.    Please follow-up in one month for blood pressure check.    Please call the internal medicine center clinic if you have any questions or concerns, we may be able to help and keep you from a long and expensive emergency room wait. Our clinic and after hours phone number is [phone], the best time to call is Monday through Friday 9 am to 4 pm but there is always someone available 24/7 if you have an emergency. If you need medication refills please notify your pharmacy one week in advance and they will send [place] a request.

## 2019-03-30 ENCOUNTER — Encounter: Payer: Self-pay | Admitting: Internal Medicine

## 2019-03-30 DIAGNOSIS — G8918 Other acute postprocedural pain: Secondary | ICD-10-CM | POA: Insufficient documentation

## 2019-03-30 NOTE — Assessment & Plan Note (Addendum)
UPEP/SPEP within normal limits.

## 2019-03-30 NOTE — Assessment & Plan Note (Signed)
BP Readings from Last 3 Encounters:  03/29/19 (!) 157/88  01/11/19 (!) 146/89  12/07/18 (!) 154/93  Hydralazine increased from 50 mg to 75 mg qd three months ago for elevated bp. She states nephrology increased it to 100 mg qd during her last visit with them. She is also taking olmesartan 40 mg qhs, norvasc 5 mg qd and coreg 12.5 mg bid.  Blood pressure continues to be elevated. She recently had renal US for evaluation of RAS which was normal. Unclear if she was also worked up for other etiologies of 2/2 hypertension.   - request nephrology records prior to further workup  - increase coreg to 25 mg bid  - f/u one month blood pressure check.

## 2019-03-30 NOTE — Progress Notes (Signed)
Internal Medicine Clinic Attending  Case discussed with Dr. Seawell at the time of the visit.  We reviewed the resident's history and exam and pertinent patient test results.  I agree with the assessment, diagnosis, and plan of care documented in the resident's note.    

## 2019-03-30 NOTE — Assessment & Plan Note (Signed)
Last a1c 6.7. Today 6.9. Glucose has been stable and controlled on metformin xr 1000 mg qd.   - foot exam  - continue current medications  - cont. Simvastatin 20 mg qd - a1c 6 months

## 2019-03-30 NOTE — Assessment & Plan Note (Signed)
She has been having superficial pain in the area over her left breast where mastectomy was done in addition to some pain in the left axilla with swelling. On PE there is some diffuse swelling without any palpable masses. She is TTP in this region in addition to the superior area of her left breath. The LUE does not have any swelling. She had already scheduled a f/u appointment with her general surgeon.   F/u appointment 1/16

## 2019-04-04 ENCOUNTER — Other Ambulatory Visit: Payer: Self-pay | Admitting: Internal Medicine

## 2019-04-04 DIAGNOSIS — M1711 Unilateral primary osteoarthritis, right knee: Secondary | ICD-10-CM

## 2019-04-04 DIAGNOSIS — J302 Other seasonal allergic rhinitis: Secondary | ICD-10-CM

## 2019-04-04 NOTE — Telephone Encounter (Signed)
Need refill on carvedilol (COREG) 25 MG tablet traMADol (ULTRAM) 50 MG tablet fluticasone (FLONASE) 50 MCG/ACT nasal spray Lorazepam ;pt contact [phone]     carvedilol (COREG) 25 MG tablet pls update new instructions   [place], [place]

## 2019-04-04 NOTE — Telephone Encounter (Signed)
Also refill on Lorazepam which is not on current med list.

## 2019-04-05 ENCOUNTER — Other Ambulatory Visit: Payer: Self-pay | Admitting: *Deleted

## 2019-04-05 ENCOUNTER — Telehealth: Payer: Self-pay | Admitting: *Deleted

## 2019-04-05 DIAGNOSIS — M1711 Unilateral primary osteoarthritis, right knee: Secondary | ICD-10-CM

## 2019-04-05 MED ORDER — FLUTICASONE PROPIONATE 50 MCG/ACT NA SUSP: 2.0000 | Freq: Every day | NASAL | 1 refills | Status: DC

## 2019-04-05 MED ORDER — TRAMADOL HCL 50 MG PO TABS: 50.0000 mg | ORAL_TABLET | ORAL | 0 refills | Status: DC | PRN

## 2019-04-05 MED ORDER — TRAMADOL HCL 50 MG PO TABS: ORAL_TABLET | ORAL | 0 refills | Status: DC

## 2019-04-05 NOTE — Telephone Encounter (Signed)
Tried to "fix" tramadol script to say every day, kept printing, [facility] NEEDS clear directions, can you resend?

## 2019-04-06 ENCOUNTER — Telehealth: Payer: Self-pay

## 2019-04-06 ENCOUNTER — Other Ambulatory Visit: Payer: Self-pay | Admitting: Internal Medicine

## 2019-04-06 DIAGNOSIS — M1711 Unilateral primary osteoarthritis, right knee: Secondary | ICD-10-CM

## 2019-04-06 MED ORDER — TRAMADOL HCL 50 MG PO TABS: 50.0000 mg | ORAL_TABLET | Freq: Every day | ORAL | 0 refills | Status: DC

## 2019-04-06 NOTE — Telephone Encounter (Signed)
Changed it, hopefully that will be sufficient?

## 2019-04-06 NOTE — Telephone Encounter (Signed)
Opened in error. SChaplin, RN,BSN  

## 2019-04-07 ENCOUNTER — Other Ambulatory Visit: Payer: Self-pay | Admitting: Internal Medicine

## 2019-04-07 DIAGNOSIS — E119 Type 2 diabetes mellitus without complications: Secondary | ICD-10-CM

## 2019-04-07 DIAGNOSIS — M1711 Unilateral primary osteoarthritis, right knee: Secondary | ICD-10-CM

## 2019-04-07 MED ORDER — TRAMADOL HCL 50 MG PO TABS: 50.0000 mg | ORAL_TABLET | Freq: Every day | ORAL | 0 refills | Status: DC

## 2019-04-11 ENCOUNTER — Telehealth: Payer: Self-pay | Admitting: *Deleted

## 2019-04-11 NOTE — Telephone Encounter (Signed)
Received fax from [place] with the following message:  "To support safe use of opioid medications, [facility] will no longer dispense more than a 30-day supply at one time.  To align with this effort, the dispensed quantity for Tramadol hcl tab 50mg  dated 04/07/2019 has been reduced from #90 to #30.  The partial quantity remaining is processed as 30 day supply refills for the limit available on the prescription or as restricted by state law. "  Will send to pcp for review.Regenia Skeeter, Carrie Schoonmaker Cassady1/19/20213:45 PM

## 2019-04-12 NOTE — Telephone Encounter (Signed)
I previously filled this as 30 days and [facility] sent me a letter requesting it be a 90 day supply. So I should change back to 30 day supply?

## 2019-04-12 NOTE — Telephone Encounter (Signed)
Per the fax, it's not necessary to send another RX. They will keep the other 60 tabs on hold and will refill as necessary. Thanks!

## 2019-04-14 ENCOUNTER — Other Ambulatory Visit: Payer: Self-pay | Admitting: Surgery

## 2019-04-14 DIAGNOSIS — R0789 Other chest pain: Secondary | ICD-10-CM | POA: Diagnosis not present

## 2019-04-14 DIAGNOSIS — Z853 Personal history of malignant neoplasm of breast: Secondary | ICD-10-CM

## 2019-04-14 DIAGNOSIS — C50912 Malignant neoplasm of unspecified site of left female breast: Secondary | ICD-10-CM | POA: Diagnosis not present

## 2019-04-21 ENCOUNTER — Encounter (HOSPITAL_COMMUNITY)
Admission: RE | Admit: 2019-04-21 | Discharge: 2019-04-21 | Disposition: A | Discharge status: Home / Self Care | Payer: Medicare Other | Source: Ambulatory Visit | Attending: Surgery | Admitting: Surgery

## 2019-04-21 ENCOUNTER — Other Ambulatory Visit: Payer: Self-pay

## 2019-04-21 DIAGNOSIS — Z853 Personal history of malignant neoplasm of breast: Secondary | ICD-10-CM | POA: Diagnosis not present

## 2019-04-21 DIAGNOSIS — C50919 Malignant neoplasm of unspecified site of unspecified female breast: Secondary | ICD-10-CM | POA: Diagnosis not present

## 2019-04-21 MED ORDER — TECHNETIUM TC 99M MEDRONATE IV KIT
21.5000 | PACK | Freq: Once | INTRAVENOUS | Status: AC | PRN
  Administered 2019-04-21: 10:00:00 21.5 via INTRAVENOUS

## 2019-04-24 ENCOUNTER — Other Ambulatory Visit: Payer: Self-pay | Admitting: Hematology and Oncology

## 2019-04-24 ENCOUNTER — Telehealth: Payer: Self-pay | Admitting: Hematology and Oncology

## 2019-04-24 DIAGNOSIS — Z170 Estrogen receptor positive status [ER+]: Secondary | ICD-10-CM

## 2019-04-24 DIAGNOSIS — C50412 Malignant neoplasm of upper-outer quadrant of left female breast: Secondary | ICD-10-CM

## 2019-04-24 NOTE — Telephone Encounter (Signed)
Dr.Tsuei sent me a message that the bone scan that was ordered to evaluate the cause of the chest wall pain came back abnormal uptake in the T11 and left frontal areas. I called the patient and informed her of this result and recommended that we first start off with an MRI of her back for further evaluation. If necessary we will have to biopsy it. I will call her with the results of the MRI.

## 2019-04-25 ENCOUNTER — Telehealth: Payer: Self-pay | Admitting: *Deleted

## 2019-04-25 NOTE — Telephone Encounter (Signed)
Apt scheduled for MRI thoracic spine W and W/O contrast for 05/03/2019 at [facility] with arrival time of 11:30am.  Pt notified of apt date and time and verified understanding.

## 2019-04-26 ENCOUNTER — Encounter: Payer: Medicare Other | Admitting: Internal Medicine

## 2019-05-03 ENCOUNTER — Other Ambulatory Visit: Payer: Self-pay

## 2019-05-03 ENCOUNTER — Ambulatory Visit (INDEPENDENT_AMBULATORY_CARE_PROVIDER_SITE_OTHER): Payer: Medicare Other | Admitting: Internal Medicine

## 2019-05-03 ENCOUNTER — Encounter: Payer: Self-pay | Admitting: Internal Medicine

## 2019-05-03 ENCOUNTER — Ambulatory Visit (HOSPITAL_COMMUNITY)
Admission: RE | Admit: 2019-05-03 | Discharge: 2019-05-03 | Disposition: A | Discharge status: Home / Self Care | Payer: Medicare Other | Source: Ambulatory Visit | Attending: Hematology and Oncology | Admitting: Hematology and Oncology

## 2019-05-03 ENCOUNTER — Encounter: Payer: Medicare Other | Admitting: Internal Medicine

## 2019-05-03 VITALS — BP 146/88 | HR 84 | Temp 98.7°F | Ht 68.0 in | Wt 230.3 lb

## 2019-05-03 DIAGNOSIS — C50412 Malignant neoplasm of upper-outer quadrant of left female breast: Secondary | ICD-10-CM | POA: Diagnosis not present

## 2019-05-03 DIAGNOSIS — E119 Type 2 diabetes mellitus without complications: Secondary | ICD-10-CM | POA: Diagnosis not present

## 2019-05-03 DIAGNOSIS — I10 Essential (primary) hypertension: Secondary | ICD-10-CM | POA: Diagnosis not present

## 2019-05-03 DIAGNOSIS — Z170 Estrogen receptor positive status [ER+]: Secondary | ICD-10-CM | POA: Insufficient documentation

## 2019-05-03 DIAGNOSIS — N390 Urinary tract infection, site not specified: Secondary | ICD-10-CM | POA: Diagnosis not present

## 2019-05-03 DIAGNOSIS — Z87440 Personal history of urinary (tract) infections: Secondary | ICD-10-CM

## 2019-05-03 DIAGNOSIS — R300 Dysuria: Secondary | ICD-10-CM | POA: Diagnosis not present

## 2019-05-03 DIAGNOSIS — M5134 Other intervertebral disc degeneration, thoracic region: Secondary | ICD-10-CM | POA: Diagnosis not present

## 2019-05-03 LAB — POCT URINALYSIS DIPSTICK
Bilirubin, UA: NEGATIVE
Blood, UA: NEGATIVE
Glucose, UA: NEGATIVE
Ketones, UA: NEGATIVE
Nitrite, UA: NEGATIVE
Protein, UA: NEGATIVE
Spec Grav, UA: 1.02 (ref 1.010–1.025)
Urobilinogen, UA: 0.2 E.U./dL
pH, UA: 5.5 (ref 5.0–8.0)

## 2019-05-03 LAB — POCT I-STAT CREATININE: Creatinine, Ser: 1.5 mg/dL — ABNORMAL HIGH (ref 0.44–1.00)

## 2019-05-03 MED ORDER — GADOBUTROL 1 MMOL/ML IV SOLN
10.0000 mL | Freq: Once | INTRAVENOUS | Status: AC | PRN
  Administered 2019-05-03: 10 mL via INTRAVENOUS

## 2019-05-03 MED ORDER — NITROFURANTOIN MONOHYD MACRO 100 MG PO CAPS: 100.0000 mg | ORAL_CAPSULE | Freq: Two times a day (BID) | ORAL | 0 refills | Status: AC

## 2019-05-03 MED ORDER — NITROFURANTOIN MONOHYD MACRO 100 MG PO CAPS: 100.0000 mg | ORAL_CAPSULE | Freq: Two times a day (BID) | ORAL | 0 refills | Status: DC

## 2019-05-03 NOTE — Patient Instructions (Signed)
Ms. Bentivegna,   Thanks for seeing [place] today. It looks like you have a UTI and I will prescribe antibiotics for 5 days.   Take care! Dr. Eileen Stanford  Please call the internal medicine center clinic if you have any questions or concerns, we may be able to help and keep you from a long and expensive emergency room wait. Our clinic and after hours phone number is [phone], the best time to call is Monday through Friday 9 am to 4 pm but there is always someone available 24/7 if you have an emergency. If you need medication refills please notify your pharmacy one week in advance and they will send [place] a request.

## 2019-05-03 NOTE — Progress Notes (Signed)
   CC: Urinary frequency  HPI:  [name] is a 69 y.o. with medical history significant for well-controlled diabetes mellitus, hypertension presenting for evaluation of urinary frequency.  Please see problem based charting for further details.  Past Medical History:  Diagnosis Date  . Arthritis    DJD, lumbar spondylosis  . Asthma   . Back pain   . Bilateral renal cysts   . Breast cancer ([place])    left breast invasive ductal ca in remission as of 10/07/11 s/p double mastectomy  . Breast cancer of upper-outer quadrant of left female breast ([place]) 01/15/2010   Per Dr. Laurelyn Sickle last clinic note 04/2012:  stage II a invasive ductal carcinoma of the left breast diagnosed in October 2011. She was found to have a ER/PR positive HER-2/neu positive breast cancer.She underwent bilateral mastectomies. Her right mastectomy was an elective prophylactic mastectomy due to her on comfort. Patient subsequently underwent adjuvant chemotherapy consisting of Taxotere carbop  . Bronchitis 03/10/2012  . Cataract   . Chemotherapy follow-up examination 03/06/2011  . Colon polyps   . Cough 03/06/2011  . Depression    seasonal melancholia  . E. coli UTI   . Edema extremities   . GERD (gastroesophageal reflux disease)   . Hyperlipidemia   . Hypertension   . Ileitis   . Kidney stones    non obstructive   . Myopathy    not statin related, cause unknown    Review of Systems:  As per HPI  Physical Exam:  Vitals:   05/03/19 1448  BP: (!) 146/88  Pulse: 84  Temp: 98.7 F (37.1 C)  TempSrc: Oral  SpO2: 99%  Weight: 230 lb 4.8 oz (104.5 kg)  Height: '5\' 8"'$  (1.727 m)   Physical Exam  Constitutional: She is well-developed, well-nourished, and in no distress.  Abdominal: Soft. Bowel sounds are normal. She exhibits no distension. There is no abdominal tenderness.    Assessment & Plan:   See Encounters Tab for problem based charting.  Patient discussed with Dr. Heber Towner

## 2019-05-03 NOTE — Assessment & Plan Note (Signed)
Urinary frequency/UTI: [name] reports that for the past day she has been experiencing urinary frequency and urgency.  She denies dysuria, fevers, chills or suprapubic pain.  Of note, she had a prior history of E. coli UTI in 2015 that was pansensitive.  Physical exams, she did not endorse suprapubic tenderness on palpation.  A urine dipstick showed moderate leukocytes.  Plan: -Start 5-day course of Macrobid 100 mg twice daily

## 2019-05-04 NOTE — Progress Notes (Signed)
Internal Medicine Clinic Attending  Case discussed with Dr. Agyei at the time of the visit.  We reviewed the resident's history and exam and pertinent patient test results.  I agree with the assessment, diagnosis, and plan of care documented in the resident's note.    

## 2019-05-09 ENCOUNTER — Telehealth: Payer: Self-pay | Admitting: Hematology and Oncology

## 2019-05-09 DIAGNOSIS — R0789 Other chest pain: Secondary | ICD-10-CM | POA: Diagnosis not present

## 2019-05-09 DIAGNOSIS — C50912 Malignant neoplasm of unspecified site of left female breast: Secondary | ICD-10-CM | POA: Diagnosis not present

## 2019-05-09 NOTE — Telephone Encounter (Signed)
I informed the patient that the MRI of the back did not show any evidence of metastatic breast cancer.  There was extensive degenerative disc disease.

## 2019-05-24 ENCOUNTER — Ambulatory Visit
Admission: RE | Admit: 2019-05-24 | Discharge: 2019-05-24 | Disposition: A | Discharge status: Home / Self Care | Payer: Medicare Other | Source: Ambulatory Visit | Attending: Internal Medicine | Admitting: Internal Medicine

## 2019-05-24 ENCOUNTER — Other Ambulatory Visit: Payer: Self-pay

## 2019-05-24 DIAGNOSIS — Z780 Asymptomatic menopausal state: Secondary | ICD-10-CM | POA: Diagnosis not present

## 2019-05-24 DIAGNOSIS — E2839 Other primary ovarian failure: Secondary | ICD-10-CM

## 2019-05-27 ENCOUNTER — Encounter: Payer: Self-pay | Admitting: Internal Medicine

## 2019-05-31 ENCOUNTER — Ambulatory Visit (INDEPENDENT_AMBULATORY_CARE_PROVIDER_SITE_OTHER): Payer: Medicare Other | Admitting: Internal Medicine

## 2019-05-31 VITALS — BP 138/85 | HR 89 | Wt 231.1 lb

## 2019-05-31 DIAGNOSIS — G8918 Other acute postprocedural pain: Secondary | ICD-10-CM | POA: Diagnosis not present

## 2019-05-31 DIAGNOSIS — Z9012 Acquired absence of left breast and nipple: Secondary | ICD-10-CM | POA: Diagnosis not present

## 2019-05-31 DIAGNOSIS — I10 Essential (primary) hypertension: Secondary | ICD-10-CM

## 2019-05-31 DIAGNOSIS — E1159 Type 2 diabetes mellitus with other circulatory complications: Secondary | ICD-10-CM

## 2019-05-31 DIAGNOSIS — Z79899 Other long term (current) drug therapy: Secondary | ICD-10-CM

## 2019-05-31 DIAGNOSIS — Z6835 Body mass index (BMI) 35.0-35.9, adult: Secondary | ICD-10-CM

## 2019-05-31 DIAGNOSIS — Z7984 Long term (current) use of oral hypoglycemic drugs: Secondary | ICD-10-CM

## 2019-05-31 DIAGNOSIS — N1832 Chronic kidney disease, stage 3b: Secondary | ICD-10-CM

## 2019-05-31 DIAGNOSIS — E119 Type 2 diabetes mellitus without complications: Secondary | ICD-10-CM

## 2019-05-31 DIAGNOSIS — I152 Hypertension secondary to endocrine disorders: Secondary | ICD-10-CM

## 2019-05-31 NOTE — Progress Notes (Signed)
   CC: blood pressure check   HPI:  [name] is a 69 y.o. with PMH as below.   Please see A&P for assessment of the patient's acute and chronic medical conditions.   Past Medical History:  Diagnosis Date  . Arthritis    DJD, lumbar spondylosis  . Asthma   . Back pain   . Bilateral renal cysts   . Breast cancer ([place])    left breast invasive ductal ca in remission as of 10/07/11 s/p double mastectomy  . Breast cancer of upper-outer quadrant of left female breast ([place]) 01/15/2010   Per Dr. Laurelyn Sickle last clinic note 04/2012:  stage II a invasive ductal carcinoma of the left breast diagnosed in October 2011. She was found to have a ER/PR positive HER-2/neu positive breast cancer.She underwent bilateral mastectomies. Her right mastectomy was an elective prophylactic mastectomy due to her on comfort. Patient subsequently underwent adjuvant chemotherapy consisting of Taxotere carbop  . Bronchitis 03/10/2012  . Cataract   . Chemotherapy follow-up examination 03/06/2011  . Colon polyps   . Cough 03/06/2011  . Depression    seasonal melancholia  . E. coli UTI   . Edema extremities   . GERD (gastroesophageal reflux disease)   . Hyperlipidemia   . Hypertension   . Ileitis   . Kidney stones    non obstructive   . Myopathy    not statin related, cause unknown    Review of Systems:  Review of Systems  Constitutional: Negative for chills, fever and weight loss.  HENT: Negative for congestion and sore throat.   Respiratory: Negative for cough and shortness of breath.   Cardiovascular: Negative for chest pain, palpitations and leg swelling.  Gastrointestinal: Negative for abdominal pain, constipation, heartburn and nausea.  Genitourinary: Negative for dysuria and frequency.   Physical Exam:   Constitution: NAD, appears stated age Cardio: RRR, no m/r/g, no LE edema  Respiratory: CTA, no w/r/r Abdominal: NTTP, soft, non-distended MSK: moving all extremities Neuro: normal  affect, a&ox3 Skin: c/d/i    Vitals:   05/31/19 1353  BP: 138/85  Pulse: 89  Weight: 231 lb 1.6 oz (104.8 kg)     Assessment & Plan:   See Encounters Tab for problem based charting.  Patient discussed with Dr. Daryll Drown

## 2019-05-31 NOTE — Patient Instructions (Signed)
Thank you for allowing [place] to provide your care today. Today we discussed your blood pressure and type II diabetes.    I have ordered the following labs for you:    I will call if any are abnormal.    Today we made the following changes to your medications:   Please follow-up in two months for hemoglobin a1c labs. Please follow-up in six months for appointment.    Please call the internal medicine center clinic if you have any questions or concerns, we may be able to help and keep you from a long and expensive emergency room wait. Our clinic and after hours phone number is [phone], the best time to call is Monday through Friday 9 am to 4 pm but there is always someone available 24/7 if you have an emergency. If you need medication refills please notify your pharmacy one week in advance and they will send [place] a request.

## 2019-06-01 LAB — BMP8+ANION GAP
Anion Gap: 15 mmol/L (ref 10.0–18.0)
BUN/Creatinine Ratio: 11 — ABNORMAL LOW (ref 12–28)
BUN: 16 mg/dL (ref 8–27)
CO2: 24 mmol/L (ref 20–29)
Calcium: 10 mg/dL (ref 8.7–10.3)
Chloride: 104 mmol/L (ref 96–106)
Creatinine, Ser: 1.47 mg/dL — ABNORMAL HIGH (ref 0.57–1.00)
GFR calc Af Amer: 41 mL/min/{1.73_m2} — ABNORMAL LOW (ref >59)
GFR calc non Af Amer: 36 mL/min/{1.73_m2} — ABNORMAL LOW (ref >59)
Glucose: 84 mg/dL (ref 65–99)
Potassium: 4 mmol/L (ref 3.5–5.2)
Sodium: 143 mmol/L (ref 134–144)

## 2019-06-02 NOTE — Assessment & Plan Note (Signed)
Followed up with oncology for pain. Whole body scan was done with questionable lesions at T11 and left frontal calvarium. Thoracic MRI was done for follow-up and was negative for mets. Her left sided pain has resolved. She states she is not sure about follow-up for calvarium imaging and no further findings in notes.   - message sent to Dr. Lindi Adie to determine if further work-up necessary for frontal calvarium findings

## 2019-06-02 NOTE — Assessment & Plan Note (Signed)
BP Readings from Last 3 Encounters:  05/31/19 138/85  05/03/19 (!) 146/88  03/29/19 (!) 157/88  Blood pressure previously chronically elevated. Previous work-up for secondary causes have been negative. Medications include olmesartan 40 mg qhs, norvasc 5 mg qd, hydral 75 mg. Coreg was increased one month ago from 12.5 mg bid to 25 mg bid and blood pressure today much improved.   - cont. Current medications  - BMP today  - f/u six months

## 2019-06-02 NOTE — Assessment & Plan Note (Signed)
She has been exercising and walking more and trying to eat healthier. She is feeling better than she has in a long time. Encouraged continuing healthier diet and exercise.

## 2019-06-02 NOTE — Assessment & Plan Note (Signed)
Glucose at home running between 110-145. Continue metformin xr 1000 mg qd.   - f/u two months for a1c labs  - cont. Current medications

## 2019-06-07 NOTE — Progress Notes (Signed)
Internal Medicine Clinic Attending  Case discussed with Dr. Seawell at the time of the visit.  We reviewed the resident's history and exam and pertinent patient test results.  I agree with the assessment, diagnosis, and plan of care documented in the resident's note.    

## 2019-06-09 NOTE — Addendum Note (Signed)
Addended by: Molli Hazard A on: 06/09/2019 12:34 PM   Modules accepted: Orders

## 2019-06-19 ENCOUNTER — Other Ambulatory Visit: Payer: Self-pay | Admitting: Internal Medicine

## 2019-06-19 DIAGNOSIS — M25561 Pain in right knee: Secondary | ICD-10-CM

## 2019-06-29 ENCOUNTER — Other Ambulatory Visit: Payer: Self-pay

## 2019-06-29 ENCOUNTER — Telehealth: Payer: Self-pay | Admitting: Adult Health

## 2019-06-29 ENCOUNTER — Encounter: Payer: Medicare Other | Admitting: Adult Health

## 2019-06-29 ENCOUNTER — Encounter: Payer: Self-pay | Admitting: Adult Health

## 2019-06-29 NOTE — Telephone Encounter (Signed)
Had scheduled telemedicine visit on 06/29/19 at 11:30 . Attempted to call x 3 from 11:30 to 11:45 with no answer. Will need to reschedule for follow up visit at later date.   Please call her to reschedule .   Haylea Schlichting NP-C

## 2019-06-29 NOTE — Telephone Encounter (Signed)
Called and spoke with pt in regards to message from TP. Pt stated that she didn't know what happened as she has been at home waiting for the phone to ring. Asked pt if I could reschedule televisit and she stated that would be fine. Pt's visit has been rescheduled for Monday, 4/12 with Sarah. Nothing further needed.

## 2019-07-03 ENCOUNTER — Ambulatory Visit (INDEPENDENT_AMBULATORY_CARE_PROVIDER_SITE_OTHER): Payer: Medicare Other | Admitting: Acute Care

## 2019-07-03 ENCOUNTER — Other Ambulatory Visit: Payer: Self-pay

## 2019-07-03 ENCOUNTER — Encounter: Payer: Self-pay | Admitting: Acute Care

## 2019-07-03 ENCOUNTER — Other Ambulatory Visit: Payer: Self-pay | Admitting: Internal Medicine

## 2019-07-03 DIAGNOSIS — Z0000 Encounter for general adult medical examination without abnormal findings: Secondary | ICD-10-CM

## 2019-07-03 DIAGNOSIS — J45991 Cough variant asthma: Secondary | ICD-10-CM

## 2019-07-03 DIAGNOSIS — E785 Hyperlipidemia, unspecified: Secondary | ICD-10-CM

## 2019-07-03 NOTE — Progress Notes (Signed)
Virtual Visit via Telephone Note  I connected with [name] on 07/03/19 at  9:30 AM EDT by telephone and verified that I am speaking with the correct person using two identifiers.  Location: Patient: At home Provider: Working Virtually from home   I discussed the limitations, risks, security and privacy concerns of performing an evaluation and management service by telephone and the availability of in person appointments. I also discussed with the patient that there may be a patient responsible charge related to this service. The patient expressed understanding and agreed to proceed.  Synopsis 69 year old female never smoker followed for cough variant asthma, chronic rhinitis and GERD by Dr. Elsworth Soho.  Patient has a history of breast cancer status post chemo, history of grade 1 diastolic dysfunction, and history of obstructive sleep apnea No perceived benefit  with QVAR , Zyrtec and Astelin    History of Present Illness: Pt. Was a no show for visit 06/29/2019 with Tammy Parrett.. Last OV was acute visit with Lazaro Arms NP 05/05/2018. Currently maintained on Clatitin, Slaine spray/rinse, Flonase, and pepcid for reflux Endorses no benefit from maintenance inhaler therapeitic Trials She  presents today by phone. She states she has been doing well. She has been using her claritin daily. She has stopped using her saline as it makes her stuffy.She is using her Flonase 2 puffs once daily.She is not using the pepcid at bedtime. She states she is using her rescue inhaler twice daily in the am and the pm. She is using this as a maintenance inhaler. She feels this regimen is working well for her. We did discuss showering after being outside to wash off pollen while the pollen counts are so high.She denies any issues or needs. She knows to call us for an appointment any time she needs [place] for flare. Pt has received 2 doses of the Moderna vaccine. She is fully vaccinated for Covid 19    Observations/Objective: Methacholine challenge 11/2007 drop in smaller airways >>added singulair   Spirometry 11/2007 wnl &11/2010 -no obstruction.  Head CT 9/12 Ethmoid sinus mucosal thickening and bubbly opacity in the left sphenoid  PSG 06/2012 showed mild OSA - she felt very claustrophobic,she could not afford the CPAP - hence abandoned  Spirometry 04/29/18 -  shownormal lung function with FEV1 at 102%, ratio 79, FVC 101% mid flows normal.   Assessment and Plan: Cough Variant Asthma Plan Continue Albuterol as needed for shortness of breath or wheezing as you are doing. Continue Claritin at Bedtime As needed Drainage  Continue on Flonase 2 puffs daily As needed nasal congestion .  Marland Kitchen  Let us know if you want to try a maintenance inhaler. Remember to shower after being outside in the pollen. Remember to remove your shoes before coming into the house if you think they may have pollen on them.  Congratulations on receiving your Covid Vaccine. Call us at any time for an appointment for flare or if you need to be seen. Please contact office for sooner follow up if symptoms do not improve or worsen or seek emergency care   Follow Up Instructions: Follow up with Dr. Elsworth Soho or Rexene Edison NP in 12 months or sooner as needed   I discussed the assessment and treatment plan with the patient. The patient was provided an opportunity to ask questions and all were answered. The patient agreed with the plan and demonstrated an understanding of the instructions.   The patient was advised to call back or seek an  in-person evaluation if the symptoms worsen or if the condition fails to improve as anticipated.  I provided 20 minutes of non-face-to-face time during this encounter.   Magdalen Spatz, NP  07/03/2019 9:46 AM

## 2019-07-03 NOTE — Patient Instructions (Addendum)
It was great to speak with you today. I am glad you are doing well.  Continue Albuterol as needed for shortness of breath or wheezing as you are doing. Continue Claritin at Bedtime As needed Drainage  Continue on Flonase 2 puffs daily As needed nasal congestion .  Marland Kitchen  Follow up with Dr. Elsworth Soho or Rexene Edison, NP in 12 months Call us at any time for an appointment for flare or if you need to be seen.  Remember to shower after being outside in the pollen. Remember to remove your shoes before coming into the house if you think they may have pollen on them.  Congratulations on receiving your Covid Vaccine. Enjoy your spring and summer.  Please contact office for sooner follow up if symptoms do not improve or worsen or seek emergency care

## 2019-07-19 ENCOUNTER — Ambulatory Visit (INDEPENDENT_AMBULATORY_CARE_PROVIDER_SITE_OTHER): Payer: Medicare Other | Admitting: Internal Medicine

## 2019-07-19 ENCOUNTER — Encounter: Payer: Self-pay | Admitting: Internal Medicine

## 2019-07-19 VITALS — BP 149/84 | HR 84 | Ht 68.0 in | Wt 231.9 lb

## 2019-07-19 DIAGNOSIS — H9012 Conductive hearing loss, unilateral, left ear, with unrestricted hearing on the contralateral side: Secondary | ICD-10-CM | POA: Insufficient documentation

## 2019-07-19 DIAGNOSIS — H9192 Unspecified hearing loss, left ear: Secondary | ICD-10-CM | POA: Insufficient documentation

## 2019-07-19 NOTE — Progress Notes (Signed)
CC: ear fullness  HPI:  Ms.Elleni C Bubel is a 69 y.o. F with significant PMH as outlined below, who presents for 2 week history of left ear fullness/wooshing sound. Please see problem-based charting for additional information.  Past Medical History:  Diagnosis Date  . Arthritis    DJD, lumbar spondylosis  . Asthma   . Back pain   . Bilateral renal cysts   . Breast cancer ([place])    left breast invasive ductal ca in remission as of 10/07/11 s/p double mastectomy  . Breast cancer of upper-outer quadrant of left female breast ([place]) 01/15/2010   Per Dr. Laurelyn Sickle last clinic note 04/2012:  stage II a invasive ductal carcinoma of the left breast diagnosed in October 2011. She was found to have a ER/PR positive HER-2/neu positive breast cancer.She underwent bilateral mastectomies. Her right mastectomy was an elective prophylactic mastectomy due to her on comfort. Patient subsequently underwent adjuvant chemotherapy consisting of Taxotere carbop  . Bronchitis 03/10/2012  . Cataract   . Chemotherapy follow-up examination 03/06/2011  . Colon polyps   . Cough 03/06/2011  . Depression    seasonal melancholia  . E. coli UTI   . Edema extremities   . GERD (gastroesophageal reflux disease)   . Hyperlipidemia   . Hypertension   . Ileitis   . Kidney stones    non obstructive   . Myopathy    not statin related, cause unknown    Review of Systems:   Review of Systems  Constitutional: Negative for chills, fever and malaise/fatigue.  HENT: Positive for congestion, hearing loss (L side) and tinnitus ("ocean sound"). Negative for ear discharge, ear pain, sinus pain and sore throat.   Eyes: Negative for blurred vision, discharge and redness.  Respiratory: Negative for shortness of breath.   Cardiovascular: Negative for chest pain and palpitations.  Gastrointestinal: Negative for abdominal pain, nausea and vomiting.  Musculoskeletal: Negative for neck pain.  Skin: Negative.   Neurological:  Negative for dizziness, tingling, sensory change and headaches.   Physical Exam:  Vitals:   07/19/19 1404  BP: (!) 177/93  Pulse: 84  SpO2: 100%  Weight: 231 lb 14.4 oz (105.2 kg)  Height: '5\' 8"'$  (1.727 m)   Physical Exam Vitals and nursing note reviewed.  Constitutional:      General: She is not in acute distress.    Appearance: Normal appearance. She is not ill-appearing.  HENT:     Head: Normocephalic and atraumatic.     Comments: No tenderness to palpation of frontal or maxillary sinuses    Right Ear: Hearing, ear canal and external ear normal. No mastoid tenderness. Tympanic membrane is not injected, perforated, erythematous, retracted or bulging.     Left Ear: Ear canal and external ear normal. Decreased hearing noted. No mastoid tenderness. Tympanic membrane is not injected, perforated, erythematous, retracted or bulging.     Ears:     Comments: Minimal effusion behind TM's bilaterally.     Nose: Rhinorrhea present. No congestion.     Mouth/Throat:     Mouth: Mucous membranes are moist.     Pharynx: Oropharynx is clear. No oropharyngeal exudate or posterior oropharyngeal erythema.  Eyes:     Extraocular Movements: Extraocular movements intact.     Conjunctiva/sclera: Conjunctivae normal.  Musculoskeletal:     Cervical back: Normal range of motion.  Lymphadenopathy:     Cervical: No cervical adenopathy.  Neurological:     Mental Status: She is alert.    Assessment & Plan:  See Encounters Tab for problem based charting.  Patient discussed with Dr. Dareen Piano

## 2019-07-19 NOTE — Patient Instructions (Signed)
Ms. Balinski,  It was nice meeting you today! I am sorry you are having trouble with your left ear. I have placed a referral to the ear, nose, and throat doctors for you to be evaluated further. Their office will call you to schedule an appointment.  Thank you for letting us be a part of your care!

## 2019-07-27 NOTE — Progress Notes (Signed)
Internal Medicine Clinic Attending  Case discussed with Dr. Jones at the time of the visit.  We reviewed the resident's history and exam and pertinent patient test results.  I agree with the assessment, diagnosis, and plan of care documented in the resident's note.  

## 2019-07-27 NOTE — Assessment & Plan Note (Signed)
Pt presents for 2 week history of left ear fullness. States it came on gradually and is now constant. Pt finds it difficult describe, but when prompted agrees it may be like an ocean or whooshing sound. Pt says she occasionally cannot hear from her left side. Denies ear pain, ear drainage, or neck pain. Uses Q-tips at home and has been seeing some wax on that side. Has seasonal allergies for which she takes daily Flonase and Claritin. Endorses rhinorrhea and some sinus congestion. Denies headaches, sinus pain, sore throat, trouble swallowing, or recent illness. No fevers or chills at home. Pt requests referral to ENT. 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 Otoscopic examination reveals small effusions behind the TM's bilaterally, mild cerumen on the left side, and was otherwise unremarkable. No mastoid or sinus tenderness. No cervical lymphadenopathy. Pt with decreased hearing of the left ear on finger rub.   - will refer to ENT for elevation of unilateral hearing loss

## 2019-07-31 ENCOUNTER — Other Ambulatory Visit (INDEPENDENT_AMBULATORY_CARE_PROVIDER_SITE_OTHER): Payer: Medicare Other

## 2019-07-31 DIAGNOSIS — G8918 Other acute postprocedural pain: Secondary | ICD-10-CM | POA: Diagnosis not present

## 2019-07-31 DIAGNOSIS — J342 Deviated nasal septum: Secondary | ICD-10-CM | POA: Insufficient documentation

## 2019-07-31 DIAGNOSIS — H9193 Unspecified hearing loss, bilateral: Secondary | ICD-10-CM | POA: Insufficient documentation

## 2019-07-31 DIAGNOSIS — N1832 Chronic kidney disease, stage 3b: Secondary | ICD-10-CM

## 2019-07-31 DIAGNOSIS — H6983 Other specified disorders of Eustachian tube, bilateral: Secondary | ICD-10-CM | POA: Diagnosis not present

## 2019-07-31 DIAGNOSIS — J343 Hypertrophy of nasal turbinates: Secondary | ICD-10-CM | POA: Diagnosis not present

## 2019-07-31 DIAGNOSIS — J302 Other seasonal allergic rhinitis: Secondary | ICD-10-CM | POA: Diagnosis not present

## 2019-08-01 DIAGNOSIS — C50911 Malignant neoplasm of unspecified site of right female breast: Secondary | ICD-10-CM | POA: Diagnosis not present

## 2019-08-01 DIAGNOSIS — C50912 Malignant neoplasm of unspecified site of left female breast: Secondary | ICD-10-CM | POA: Diagnosis not present

## 2019-08-02 LAB — MULTIPLE MYELOMA PANEL, SERUM
Albumin SerPl Elph-Mcnc: 3.9 g/dL (ref 2.9–4.4)
Albumin/Glob SerPl: 1.3 (ref 0.7–1.7)
Alpha 1: 0.2 g/dL (ref 0.0–0.4)
Alpha2 Glob SerPl Elph-Mcnc: 0.6 g/dL (ref 0.4–1.0)
B-Globulin SerPl Elph-Mcnc: 1.2 g/dL (ref 0.7–1.3)
Gamma Glob SerPl Elph-Mcnc: 1.1 g/dL (ref 0.4–1.8)
Globulin, Total: 3.2 g/dL (ref 2.2–3.9)
IgA/Immunoglobulin A, Serum: 186 mg/dL (ref 87–352)
IgG (Immunoglobin G), Serum: 1089 mg/dL (ref 586–1602)
IgM (Immunoglobulin M), Srm: 48 mg/dL (ref 26–217)
Total Protein: 7.1 g/dL (ref 6.0–8.5)

## 2019-08-02 LAB — KAPPA/LAMBDA LIGHT CHAINS
Ig Kappa Free Light Chain: 27.2 mg/L — ABNORMAL HIGH (ref 3.3–19.4)
Ig Lambda Free Light Chain: 13.7 mg/L (ref 5.7–26.3)
KAPPA/LAMBDA RATIO: 1.99 — ABNORMAL HIGH (ref 0.26–1.65)

## 2019-08-26 ENCOUNTER — Other Ambulatory Visit: Payer: Self-pay | Admitting: Internal Medicine

## 2019-08-26 DIAGNOSIS — J302 Other seasonal allergic rhinitis: Secondary | ICD-10-CM

## 2019-09-15 DIAGNOSIS — R809 Proteinuria, unspecified: Secondary | ICD-10-CM | POA: Diagnosis not present

## 2019-09-15 DIAGNOSIS — D649 Anemia, unspecified: Secondary | ICD-10-CM | POA: Diagnosis not present

## 2019-09-15 DIAGNOSIS — E1122 Type 2 diabetes mellitus with diabetic chronic kidney disease: Secondary | ICD-10-CM | POA: Diagnosis not present

## 2019-09-15 DIAGNOSIS — I129 Hypertensive chronic kidney disease with stage 1 through stage 4 chronic kidney disease, or unspecified chronic kidney disease: Secondary | ICD-10-CM | POA: Diagnosis not present

## 2019-09-15 DIAGNOSIS — N1832 Chronic kidney disease, stage 3b: Secondary | ICD-10-CM | POA: Diagnosis not present

## 2019-09-20 DIAGNOSIS — H04123 Dry eye syndrome of bilateral lacrimal glands: Secondary | ICD-10-CM | POA: Diagnosis not present

## 2019-09-20 DIAGNOSIS — H524 Presbyopia: Secondary | ICD-10-CM | POA: Diagnosis not present

## 2019-09-20 DIAGNOSIS — Z7984 Long term (current) use of oral hypoglycemic drugs: Secondary | ICD-10-CM | POA: Diagnosis not present

## 2019-09-20 DIAGNOSIS — H5203 Hypermetropia, bilateral: Secondary | ICD-10-CM | POA: Diagnosis not present

## 2019-09-20 DIAGNOSIS — E119 Type 2 diabetes mellitus without complications: Secondary | ICD-10-CM | POA: Diagnosis not present

## 2019-09-20 LAB — HM DIABETES EYE EXAM

## 2019-10-06 DIAGNOSIS — H906 Mixed conductive and sensorineural hearing loss, bilateral: Secondary | ICD-10-CM | POA: Insufficient documentation

## 2019-11-06 ENCOUNTER — Other Ambulatory Visit: Payer: Self-pay | Admitting: Internal Medicine

## 2019-11-06 DIAGNOSIS — I10 Essential (primary) hypertension: Secondary | ICD-10-CM

## 2019-11-07 NOTE — Telephone Encounter (Signed)
Please make appointment with Dr. Sharon Seller in 1-2 months per Dr Keith Rake

## 2019-11-07 NOTE — Telephone Encounter (Signed)
Pt appt 11/28/2019 @ 2:45.

## 2019-11-09 ENCOUNTER — Telehealth: Payer: Self-pay | Admitting: *Deleted

## 2019-11-09 NOTE — Telephone Encounter (Signed)
That sounds reasonable, I agree with the plan. Thank you.

## 2019-11-09 NOTE — Telephone Encounter (Signed)
Pt calls and states her BP has changed a little today, it was 99/53. Blood sugar was 166 She denies weakness, dizziness, h/a, N&V, short of breath, chest pain, vision changes, states she is just having a quiet uneventful morning. She is advised to make sure she is well hydrated, if any of the above happens call 911. Suggested keeping a log and if bp stays low to call mon for appt.

## 2019-11-23 ENCOUNTER — Other Ambulatory Visit: Payer: Self-pay | Admitting: Internal Medicine

## 2019-11-23 DIAGNOSIS — M25561 Pain in right knee: Secondary | ICD-10-CM

## 2019-11-23 DIAGNOSIS — G629 Polyneuropathy, unspecified: Secondary | ICD-10-CM

## 2019-11-23 DIAGNOSIS — I5032 Chronic diastolic (congestive) heart failure: Secondary | ICD-10-CM

## 2019-11-28 ENCOUNTER — Encounter: Payer: Medicare Other | Admitting: Internal Medicine

## 2019-11-28 ENCOUNTER — Encounter: Payer: Self-pay | Admitting: Dietician

## 2019-11-29 ENCOUNTER — Encounter: Payer: Self-pay | Admitting: Internal Medicine

## 2019-11-29 ENCOUNTER — Ambulatory Visit (INDEPENDENT_AMBULATORY_CARE_PROVIDER_SITE_OTHER): Payer: Medicare Other | Admitting: Internal Medicine

## 2019-11-29 ENCOUNTER — Other Ambulatory Visit: Payer: Self-pay

## 2019-11-29 VITALS — BP 156/75 | HR 73 | Wt 231.0 lb

## 2019-11-29 DIAGNOSIS — E119 Type 2 diabetes mellitus without complications: Secondary | ICD-10-CM

## 2019-11-29 DIAGNOSIS — L811 Chloasma: Secondary | ICD-10-CM | POA: Diagnosis not present

## 2019-11-29 DIAGNOSIS — M21622 Bunionette of left foot: Secondary | ICD-10-CM

## 2019-11-29 DIAGNOSIS — I10 Essential (primary) hypertension: Secondary | ICD-10-CM

## 2019-11-29 DIAGNOSIS — E1159 Type 2 diabetes mellitus with other circulatory complications: Secondary | ICD-10-CM | POA: Diagnosis not present

## 2019-11-29 DIAGNOSIS — E785 Hyperlipidemia, unspecified: Secondary | ICD-10-CM

## 2019-11-29 DIAGNOSIS — M21621 Bunionette of right foot: Secondary | ICD-10-CM | POA: Diagnosis not present

## 2019-11-29 DIAGNOSIS — E1169 Type 2 diabetes mellitus with other specified complication: Secondary | ICD-10-CM

## 2019-11-29 DIAGNOSIS — I152 Hypertension secondary to endocrine disorders: Secondary | ICD-10-CM

## 2019-11-29 LAB — POCT GLYCOSYLATED HEMOGLOBIN (HGB A1C): Hemoglobin A1C: 6.3 % — AB (ref 4.0–5.6)

## 2019-11-29 LAB — GLUCOSE, CAPILLARY: Glucose-Capillary: 82 mg/dL (ref 70–99)

## 2019-11-29 MED ORDER — HYDROQUINONE 4 % EX CREA: TOPICAL_CREAM | Freq: Two times a day (BID) | CUTANEOUS | 0 refills | Status: DC

## 2019-11-29 NOTE — Patient Instructions (Signed)
Thank you for allowing [place] to provide your care today. Today we discussed your diabetes, tailor's bunion, and melasma.   I have ordered the following labs for you:  Basic metabolic panel, lipid panel    I will call if any are abnormal.    Today we made the following changes to your medications:   Please START taking  Hydroquinone 2% - apply to area of skin darkening twice per day for two months. Avoid sun while using.   Please follow-up in six months.    Please call the internal medicine center clinic if you have any questions or concerns, we may be able to help and keep you from a long and expensive emergency room wait. Our clinic and after hours phone number is [phone], the best time to call is Monday through Friday 9 am to 4 pm but there is always someone available 24/7 if you have an emergency. If you need medication refills please notify your pharmacy one week in advance and they will send [place] a request.

## 2019-11-29 NOTE — Progress Notes (Signed)
   CC: TIIDM  HPI:  [name] is a 69 y.o. with PMH as below.   Please see A&P for assessment of the patient's acute and chronic medical conditions.   Past Medical History:  Diagnosis Date  . Arthritis    DJD, lumbar spondylosis  . Asthma   . Back pain   . Bilateral renal cysts   . Breast cancer ([place])    left breast invasive ductal ca in remission as of 10/07/11 s/p double mastectomy  . Breast cancer of upper-outer quadrant of left female breast ([place]) 01/15/2010   Per Dr. Laurelyn Sickle last clinic note 04/2012:  stage II a invasive ductal carcinoma of the left breast diagnosed in October 2011. She was found to have a ER/PR positive HER-2/neu positive breast cancer.She underwent bilateral mastectomies. Her right mastectomy was an elective prophylactic mastectomy due to her on comfort. Patient subsequently underwent adjuvant chemotherapy consisting of Taxotere carbop  . Bronchitis 03/10/2012  . Cataract   . Chemotherapy follow-up examination 03/06/2011  . Colon polyps   . Cough 03/06/2011  . Depression    seasonal melancholia  . E. coli UTI   . Edema extremities   . GERD (gastroesophageal reflux disease)   . Hyperlipidemia   . Hypertension   . Ileitis   . Kidney stones    non obstructive   . Myopathy    not statin related, cause unknown    Review of Systems:   Review of Systems  Respiratory: Negative for cough, shortness of breath and wheezing.   Cardiovascular: Positive for leg swelling. Negative for chest pain, palpitations and orthopnea.  Gastrointestinal: Negative for abdominal pain, constipation, diarrhea and vomiting.  Genitourinary: Negative for dysuria, frequency and urgency.  Musculoskeletal:       Pain lateral aspect of 5th toe and distal foot  Neurological: Negative for dizziness.     Physical Exam:  Constitution: NAD, appears stated age  13: RRR, no m/r/g, no LE edema  Respiratory: CTA, no w/r/r Abdominal: NTTP, soft, non-distended MSK: moving  all extremities, TTP over left lateral 5th toe and foot, right and left foot with mild lateral bony prominence 5th toe MTP joint.  Neuro: normal affect, a&ox3 Skin: hyperpigmentation below both eyes bilaterally, R>L; otherwise c/d/i       Vitals:   11/29/19 1333  BP: (!) 156/75  Pulse: 73  SpO2: 100%  Weight: 231 lb (104.8 kg)     Assessment & Plan:   See Encounters Tab for problem based charting.  Patient discussed with Dr. Dareen Piano

## 2019-11-30 LAB — LIPID PANEL
Chol/HDL Ratio: 3.5 ratio (ref 0.0–4.4)
Cholesterol, Total: 181 mg/dL (ref 100–199)
HDL: 51 mg/dL (ref >39)
LDL Chol Calc (NIH): 89 mg/dL (ref 0–99)
Triglycerides: 243 mg/dL — ABNORMAL HIGH (ref 0–149)
VLDL Cholesterol Cal: 41 mg/dL — ABNORMAL HIGH (ref 5–40)

## 2019-11-30 LAB — BMP8+ANION GAP
Anion Gap: 15 mmol/L (ref 10.0–18.0)
BUN/Creatinine Ratio: 15 (ref 12–28)
BUN: 19 mg/dL (ref 8–27)
CO2: 23 mmol/L (ref 20–29)
Calcium: 9.9 mg/dL (ref 8.7–10.3)
Chloride: 103 mmol/L (ref 96–106)
Creatinine, Ser: 1.31 mg/dL — ABNORMAL HIGH (ref 0.57–1.00)
GFR calc Af Amer: 48 mL/min/{1.73_m2} — ABNORMAL LOW (ref >59)
GFR calc non Af Amer: 41 mL/min/{1.73_m2} — ABNORMAL LOW (ref >59)
Glucose: 75 mg/dL (ref 65–99)
Potassium: 4.3 mmol/L (ref 3.5–5.2)
Sodium: 141 mmol/L (ref 134–144)

## 2019-12-01 ENCOUNTER — Other Ambulatory Visit: Payer: Self-pay | Admitting: Internal Medicine

## 2019-12-01 DIAGNOSIS — L811 Chloasma: Secondary | ICD-10-CM

## 2019-12-01 MED ORDER — HYDROQUINONE 4 % EX CREA: TOPICAL_CREAM | Freq: Two times a day (BID) | CUTANEOUS | 0 refills | Status: DC

## 2019-12-01 NOTE — Telephone Encounter (Signed)
Need refill on hydroquinone 4 % cream  ;pt contact [phone]   [facility] [address] [place]

## 2019-12-01 NOTE — Telephone Encounter (Signed)
Rx was sent to [facility] on 11/29/2019-CMA spoke with pt and she wishes to have rx transferred to [facility]. Call made to [facility] to cancel rx and CMA was informed that rx was not covered by pt's insurance. Will still have MD [facility] [address])-will check with pt to see if she is willing to pay out of pocket.Despina Hidden Cassady9/10/20211:54 PM

## 2019-12-02 DIAGNOSIS — M21621 Bunionette of right foot: Secondary | ICD-10-CM | POA: Insufficient documentation

## 2019-12-02 DIAGNOSIS — L811 Chloasma: Secondary | ICD-10-CM | POA: Insufficient documentation

## 2019-12-02 NOTE — Assessment & Plan Note (Signed)
She has developed darkening over the skin under both eyes over the past few months. She has not been out in the sun much. This looks like melasma, picture in note. A1c is currently well controlled, no sign of other hyperpigmentation to posterior neck. She has had bilateral oophorectomy when she had breast cancer several years ago and continues to have menopause like symptoms although I would have expected this present earlier with menopause. Possibly secondary to photoaging.   - hydroquinone 4% to areas of hyperpigmentation for one month - referral placed to dermatology

## 2019-12-02 NOTE — Assessment & Plan Note (Signed)
BP Readings from Last 3 Encounters:  11/29/19 (!) 156/75  07/19/19 (!) 149/84  05/31/19 138/85   BP readings brought with her today. These are mostly lower than her reading today, around 784/12 systolic and occasionally reach 820 systolic.   - cont. norvasc 5 mg, hydralazine 75 mg tid, coreg 25 mg bid, and olmesartan 40 mg qhs  - bmp today  - f/u six months

## 2019-12-02 NOTE — Assessment & Plan Note (Addendum)
A1c 6.3 from 6.8 in January. Medication includes metformin 1000 24hr tablet.   - cont. metformin

## 2019-12-02 NOTE — Assessment & Plan Note (Signed)
She is having pain along the lateral aspect of her distal foot and 5th toe in both feet, worse on the left. The area is TTP and appears to be early Tailor's bunion on both feet. She has been wearing harder sandals that squeeze and rub up against this area. No skin breakdown present.   - avoid shoes that irritate area, wear wide toed shoes - f/u with podiatry

## 2019-12-04 ENCOUNTER — Telehealth: Payer: Self-pay | Admitting: Internal Medicine

## 2019-12-04 DIAGNOSIS — L811 Chloasma: Secondary | ICD-10-CM

## 2019-12-04 NOTE — Telephone Encounter (Signed)
I think this is the percentage we need for her treatment, the other option I saw was too low. Preferable to get the auth through if possible.

## 2019-12-04 NOTE — Telephone Encounter (Signed)
Pls contact pt [phone]; pt cream isn't covered with her insurance

## 2019-12-04 NOTE — Telephone Encounter (Signed)
TC to patient, RN spoke to patient and her daughter who states hydroquinone cream is not covered.  Pt's daughter states the insurance company gave them a 1-800 number for "MD to call and discuss".  Will forward to [facility] to reach out and see if this is a prior authorization which is needed.  Will also forward to Dr. Sharon Seller to see if she would suggest another cream. Thank you, SChaplin, RN,BSN

## 2019-12-05 ENCOUNTER — Telehealth: Payer: Self-pay | Admitting: *Deleted

## 2019-12-05 ENCOUNTER — Encounter: Payer: Self-pay | Admitting: Internal Medicine

## 2019-12-05 NOTE — Assessment & Plan Note (Signed)
Called patient with most recent results. Would recommend increase in statin therapy but previously had reaction to atorva and rosuva. Discussed optimizing diet, weight loss, and control of diabetes.

## 2019-12-05 NOTE — Telephone Encounter (Signed)
[facility] was called at [phone]. Given information for PA.  Awaiting determination within 72 hours.  [place] [phone]

## 2019-12-05 NOTE — Telephone Encounter (Signed)
Call to patient to get 1-800 # for insurance.  [facility] was called at [phone].  Given information for PA.  [place] [phone].  Determination within 72 hours.  Sander Nephew, RN 12/05/2019 10:22 AM.

## 2019-12-06 MED ORDER — HYDROQUINONE 2 % EX CREA: TOPICAL_CREAM | Freq: Two times a day (BID) | CUTANEOUS | 0 refills | Status: DC

## 2019-12-06 NOTE — Assessment & Plan Note (Signed)
Insurance will not cover 4% Hydroquinone cream. Will send 2% and have her follow-up with dermatology.

## 2019-12-07 NOTE — Progress Notes (Signed)
Internal Medicine Clinic Attending  Case discussed with Dr. Seawell  At the time of the visit.  We reviewed the resident's history and exam and pertinent patient test results.  I agree with the assessment, diagnosis, and plan of care documented in the resident's note.  

## 2019-12-13 ENCOUNTER — Other Ambulatory Visit: Payer: Self-pay | Admitting: Urology

## 2019-12-14 NOTE — Progress Notes (Addendum)
COVID Vaccine Completed:  x2  Booster (one month ago) Date COVID Vaccine completed:  June 02, 2019 COVID vaccine manufacturer: [place]   PCP - Molli Hazard, DO Cardiologist - Dr. Haroldine Laws, MD.  Last OV 01-25-17  Chest x-ray -  EKG - 12-18-19 in Epic Stress Test -  ECHO - 02-16-17 in Epic Cardiac Cath -  Pacemaker/ICD device last checked: Holter Monitor - 02-06-17 in Epic  Sleep Study - 06-21-2012 in Epic CPAP -  No  Fasting Blood Sugar - 110-150 Checks Blood Sugar - checks every other day  Blood Thinner Instructions: Aspirin Instructions:   Last Dose:  Anesthesia review: OSA, diastolic heart failure, HTN, DM  Patient denies shortness of breath, fever, cough and chest pain at PAT appointment   Patient verbalized understanding of instructions that were given to them at the PAT appointment. Patient was also instructed that they will need to review over the PAT instructions again at home before surgery.

## 2019-12-14 NOTE — Patient Instructions (Addendum)
DUE TO COVID-19 ONLY ONE VISITOR IS ALLOWED TO COME WITH YOU AND STAY IN THE WAITING ROOM ONLY  DURING PRE OP AND PROCEDURE.   IF YOU WILL BE ADMITTED INTO THE HOSPITAL YOU ARE ALLOWED ONE SUPPORT PERSON DURING  VISITATION HOURS ONLY (10AM -8PM)   . The support person may change daily. . The support person must pass our screening, gel in and out, and wear a mask at all times, including in the patient's room. . Patients must also wear a mask when staff or their support person are in the room.   COVID SWAB TESTING MUST BE COMPLETED ON:  Friday, 12-22-19 @ 10:00 AM   [address] [place], [place] [address]  (Must self quarantine after testing. Follow instructions on handout.)        Your procedure is scheduled on: Tuesday, 12-26-19   Report to [facility] Main  Entrance   Report to admitting at 9:15 AM   Call this number if you have problems the morning of surgery [phone]   Do not eat food :After Midnight.   May have liquids until 8:15 AM   day of surgery  CLEAR LIQUID DIET  Foods Allowed                                                                     Foods Excluded  Water, Black Coffee and tea, regular and decaf           liquids that you cannot  Plain Jell-O in any flavor  (No red)                                  see through such as: Fruit ices (not with fruit pulp)                                      milk, soups, orange juice              Iced Popsicles (No red)                                      All solid food                                   Apple juices Sports drinks like Gatorade (No red) Lightly seasoned clear broth or consume(fat free) Sugar, honey syrup  Oral Hygiene is also important to reduce your risk of infection.                                    Remember - BRUSH YOUR TEETH THE MORNING OF SURGERY WITH YOUR REGULAR TOOTHPASTE   Do NOT smoke after Midnight   Take these medicines the morning of surgery with A SIP OF WATER: Amlodipine,  Carvedilol,  Loratadine, Lorazepam if  needed  How to Manage Your Diabetes Before and After Surgery  Why is it important to control my blood sugar before and after surgery? . Improving blood sugar levels before and after surgery helps healing and can limit problems. . A way of improving blood sugar control is eating a healthy diet by: o  Eating less sugar and carbohydrates o  Increasing activity/exercise o  Talking with your doctor about reaching your blood sugar goals . High blood sugars (greater than 180 mg/dL) can raise your risk of infections and slow your recovery, so you will need to focus on controlling your diabetes during the weeks before surgery. . Make sure that the doctor who takes care of your diabetes knows about your planned surgery including the date and location.  How do I manage my blood sugar before surgery? . Check your blood sugar at least 4 times a day, starting 2 days before surgery, to make sure that the level is not too high or low. o Check your blood sugar the morning of your surgery when you wake up and every 2 hours until you get to the Short Stay unit. . If your blood sugar is less than 70 mg/dL, you will need to treat for low blood sugar: o Do not take insulin. o Treat a low blood sugar (less than 70 mg/dL) with  cup of clear juice (cranberry or apple), 4 glucose tablets, OR glucose gel. o Recheck blood sugar in 15 minutes after treatment (to make sure it is greater than 70 mg/dL). If your blood sugar is not greater than 70 mg/dL on recheck, call [phone] for further instructions. . Report your blood sugar to the short stay nurse when you get to Short Stay.  . If you are admitted to the hospital after surgery: o Your blood sugar will be checked by the staff and you will probably be given insulin after surgery (instead of oral diabetes medicines) to make sure you have good blood sugar levels. o The goal for blood sugar control after surgery is  80-180 mg/dL.   WHAT DO I DO ABOUT MY DIABETES MEDICATION?  Marland Kitchen Do not take oral diabetes medicines (pills) the morning of surgery.  . THE DAY BEFORE SURGERY:  Take Metformin as prescribed.       . THE MORNING OF SURGERY:  Do Not Take Metformin.  Reviewed and Endorsed by [facility] Patient Education Committee, August 2015                 You may not have any metal on your body including hair pins, jewelry, and body piercings             Do not wear make-up, lotions, powders, perfumes/cologne, or deodorant             Do not wear nail polish.  Do not shave  48 hours prior to surgery.             Do not bring valuables to the hospital. [address].   Contacts, dentures or bridgework may not be worn into surgery.     Patients discharged the day of surgery will not be allowed to drive home.                Please read over the following fact sheets you were given: IF [place] [phone]   [place] - Preparing for Surgery Before surgery, you can  play an important role.  Because skin is not sterile, your skin needs to be as free of germs as possible.  You can reduce the number of germs on your skin by washing with CHG (chlorahexidine gluconate) soap before surgery.  CHG is an antiseptic cleaner which kills germs and bonds with the skin to continue killing germs even after washing. Please DO NOT use if you have an allergy to CHG or antibacterial soaps.  If your skin becomes reddened/irritated stop using the CHG and inform your nurse when you arrive at Short Stay. Do not shave (including legs and underarms) for at least 48 hours prior to the first CHG shower.  You may shave your face/neck.  Please follow these instructions carefully:  1.  Shower with CHG Soap the night before surgery and the  morning of surgery.  2.  If you choose to wash your hair, wash your hair first as usual with your normal   shampoo.  3.  After you shampoo, rinse your hair and body thoroughly to remove the shampoo.                             4.  Use CHG as you would any other liquid soap.  You can apply chg directly to the skin and wash.  Gently with a scrungie or clean washcloth.  5.  Apply the CHG Soap to your body ONLY FROM THE NECK DOWN.   Do   not use on face/ open                           Wound or open sores. Avoid contact with eyes, ears mouth and   genitals (private parts).                       Wash face,  Genitals (private parts) with your normal soap.             6.  Wash thoroughly, paying special attention to the area where your    surgery  will be performed.  7.  Thoroughly rinse your body with warm water from the neck down.  8.  DO NOT shower/wash with your normal soap after using and rinsing off the CHG Soap.                9.  Pat yourself dry with a clean towel.            10.  Wear clean pajamas.            11.  Place clean sheets on your bed the night of your first shower and do not  sleep with pets. Day of Surgery : Do not apply any lotions/deodorants the morning of surgery.  Please wear clean clothes to the hospital/surgery center.  FAILURE TO FOLLOW THESE INSTRUCTIONS MAY RESULT IN THE CANCELLATION OF YOUR SURGERY  PATIENT SIGNATURE_________________________________  NURSE SIGNATURE__________________________________  ________________________________________________________________________

## 2019-12-18 ENCOUNTER — Encounter (HOSPITAL_COMMUNITY)
Admission: RE | Admit: 2019-12-18 | Discharge: 2019-12-18 | Disposition: A | Discharge status: Home / Self Care | Payer: Medicare Other | Source: Ambulatory Visit | Attending: Urology | Admitting: Urology

## 2019-12-18 ENCOUNTER — Encounter (HOSPITAL_COMMUNITY): Payer: Self-pay

## 2019-12-18 ENCOUNTER — Other Ambulatory Visit: Payer: Self-pay

## 2019-12-18 DIAGNOSIS — I10 Essential (primary) hypertension: Secondary | ICD-10-CM | POA: Diagnosis not present

## 2019-12-18 DIAGNOSIS — E119 Type 2 diabetes mellitus without complications: Secondary | ICD-10-CM | POA: Diagnosis not present

## 2019-12-18 DIAGNOSIS — Z01818 Encounter for other preprocedural examination: Secondary | ICD-10-CM | POA: Diagnosis present

## 2019-12-18 HISTORY — DX: Personal history of other diseases of the nervous system and sense organs: Z86.69

## 2019-12-18 HISTORY — DX: Anemia, unspecified: D64.9

## 2019-12-18 HISTORY — DX: Anxiety disorder, unspecified: F41.9

## 2019-12-18 LAB — BASIC METABOLIC PANEL
Anion gap: 8 (ref 5–15)
BUN: 26 mg/dL — ABNORMAL HIGH (ref 8–23)
CO2: 25 mmol/L (ref 22–32)
Calcium: 9.6 mg/dL (ref 8.9–10.3)
Chloride: 107 mmol/L (ref 98–111)
Creatinine, Ser: 1.6 mg/dL — ABNORMAL HIGH (ref 0.44–1.00)
GFR calc Af Amer: 37 mL/min — ABNORMAL LOW (ref >60)
GFR calc non Af Amer: 32 mL/min — ABNORMAL LOW (ref >60)
Glucose, Bld: 105 mg/dL — ABNORMAL HIGH (ref 70–99)
Potassium: 4.3 mmol/L (ref 3.5–5.1)
Sodium: 140 mmol/L (ref 135–145)

## 2019-12-18 LAB — CBC
HCT: 34.7 % — ABNORMAL LOW (ref 36.0–46.0)
Hemoglobin: 11.4 g/dL — ABNORMAL LOW (ref 12.0–15.0)
MCH: 28.2 pg (ref 26.0–34.0)
MCHC: 32.9 g/dL (ref 30.0–36.0)
MCV: 85.9 fL (ref 80.0–100.0)
Platelets: 264 10*3/uL (ref 150–400)
RBC: 4.04 MIL/uL (ref 3.87–5.11)
RDW: 13 % (ref 11.5–15.5)
WBC: 6.4 10*3/uL (ref 4.0–10.5)
nRBC: 0 % (ref 0.0–0.2)

## 2019-12-18 LAB — GLUCOSE, CAPILLARY: Glucose-Capillary: 77 mg/dL (ref 70–99)

## 2019-12-19 ENCOUNTER — Ambulatory Visit: Payer: Medicare Other | Admitting: Podiatry

## 2019-12-19 ENCOUNTER — Ambulatory Visit (INDEPENDENT_AMBULATORY_CARE_PROVIDER_SITE_OTHER): Payer: Medicare Other

## 2019-12-19 ENCOUNTER — Encounter: Payer: Self-pay | Admitting: Podiatry

## 2019-12-19 DIAGNOSIS — M21612 Bunion of left foot: Secondary | ICD-10-CM | POA: Diagnosis not present

## 2019-12-19 DIAGNOSIS — M21611 Bunion of right foot: Secondary | ICD-10-CM

## 2019-12-19 DIAGNOSIS — M779 Enthesopathy, unspecified: Secondary | ICD-10-CM

## 2019-12-19 DIAGNOSIS — M21622 Bunionette of left foot: Secondary | ICD-10-CM

## 2019-12-19 DIAGNOSIS — R200 Anesthesia of skin: Secondary | ICD-10-CM

## 2019-12-19 DIAGNOSIS — M21621 Bunionette of right foot: Secondary | ICD-10-CM

## 2019-12-19 DIAGNOSIS — E119 Type 2 diabetes mellitus without complications: Secondary | ICD-10-CM

## 2019-12-19 NOTE — Patient Instructions (Signed)
I would start a B-Complex Vitamin Look at a different shoe to wear. I like a slip on called "oofos" You can use asper cream with lidocaine to the feet as needed

## 2019-12-21 NOTE — Progress Notes (Signed)
Subjective:   Patient ID: [name], female   DOB: 69 y.o.   MRN: [id number]   HPI 69 year old female presents the office with her daughter for concerns of numbness in the balls of both of her feet as well as with pain in the outside of her feet pointing the fifth metatarsal head.  She also had some discomfort previously right third toe but seems to be doing better.  She states that this has been ongoing for about 6 months.  She states that the numbness of both feet but the same but pain to the lateral MPJ that she points to is worse on the left side than the right.  She said no recent treatment.  She is diabetic her last A1c was 6.3.  No rating pain or weakness.  No recent falls.   Review of Systems  All other systems reviewed and are negative.  Past Medical History:  Diagnosis Date  . Anemia   . Anxiety   . Arthritis    DJD, lumbar spondylosis  . Asthma   . Back pain   . Bilateral renal cysts   . Breast cancer ([place])    left breast invasive ductal ca in remission as of 10/07/11 s/p double mastectomy  . Breast cancer of upper-outer quadrant of left female breast ([place]) 01/15/2010   Per Dr. Laurelyn Sickle last clinic note 04/2012:  stage II a invasive ductal carcinoma of the left breast diagnosed in October 2011. She was found to have a ER/PR positive HER-2/neu positive breast cancer.She underwent bilateral mastectomies. Her right mastectomy was an elective prophylactic mastectomy due to her on comfort. Patient subsequently underwent adjuvant chemotherapy consisting of Taxotere carbop  . Bronchitis 03/10/2012  . Cataract   . Chemotherapy follow-up examination 03/06/2011  . Colon polyps   . Cough 03/06/2011  . Depression    seasonal melancholia  . E. coli UTI   . Edema extremities   . GERD (gastroesophageal reflux disease)   . History of migraine headaches   . Hyperlipidemia   . Hypertension   . Ileitis   . Kidney stones    non obstructive   . Myopathy    not statin related, cause  unknown     Past Surgical History:  Procedure Laterality Date  . ABDOMINAL HYSTERECTOMY     for fibroids   . APPENDECTOMY    . BREAST SURGERY  2011   Bilateral mastectomy  . INCONTINENCE SURGERY    . LAMINECTOMY     C5-6 Dr. Louanne Skye 2005   . MASTECTOMY     bilateral   . OOPHORECTOMY    . TONSILLECTOMY       Current Outpatient Medications:  .  albuterol (VENTOLIN HFA) 108 (90 Base) MCG/ACT inhaler, INHALE 1 TO 2 PUFFS INTO THE LUNGS EVERY 6 HOURS AS NEEDED FOR WHEEZING OR SHORTNESS OF BREATH. (Patient taking differently: Inhale 1-2 puffs into the lungs every 6 (six) hours as needed for wheezing or shortness of breath. ), Disp: 18 g, Rfl: 4 .  amLODipine (NORVASC) 5 MG tablet, TAKE 1 TABLET BY MOUTH  DAILY, Disp: 90 tablet, Rfl: 3 .  azelastine (ASTELIN) 0.1 % nasal spray, PLACE 2 SPRAYS INTO BOTH NOSTRILS AT BEDTIME AS NEEDED FOR RHINITIS., Disp: 30 mL, Rfl: 1 .  beclomethasone (QVAR) 80 MCG/ACT inhaler, Inhale 2 puffs into the lungs 2 (two) times daily., Disp: 1 Inhaler, Rfl: 0 .  Blood Glucose Calibration (TRUE METRIX LEVEL 1) Low SOLN, Please use as directed, Disp: 3 each,  Rfl: 0 .  Blood Glucose Monitoring Suppl (TRUE METRIX AIR GLUCOSE METER) DEVI, 1 Device by Does not apply route 3 (three) times daily as needed. Check glucose up to three times per day as needed, Disp: 1 Device, Rfl: 0 .  carvedilol (COREG) 25 MG tablet, TAKE 1 TABLET BY MOUTH  TWICE DAILY WITH MEALS (Patient taking differently: Take 25 mg by mouth 2 (two) times daily with a meal. ), Disp: 180 tablet, Rfl: 3 .  cefadroxil (DURICEF) 500 MG capsule, Take 500 mg by mouth 2 (two) times daily., Disp: , Rfl:  .  cholecalciferol (VITAMIN D) 1000 UNITS tablet, Take 1,000 Units by mouth daily., Disp: , Rfl:  .  diclofenac Sodium (VOLTAREN) 1 % GEL, APPLY 4 GRAMS TOPICALLY 4  TIMES DAILY AS NEEDED (Patient taking differently: Apply 4 g topically 4 (four) times daily as needed (pain). ), Disp: 100 g, Rfl: 0 .  fluticasone  (FLONASE) 50 MCG/ACT nasal spray, USE 2 SPRAYS IN BOTH  NOSTRILS DAILY (Patient taking differently: Place 2 sprays into both nostrils daily as needed for allergies. ), Disp: 48 g, Rfl: 1 .  gabapentin (NEURONTIN) 100 MG capsule, TAKE 1 CAPSULE BY MOUTH  TWICE DAILY, Disp: 180 capsule, Rfl: 3 .  glucose blood (TRUE METRIX BLOOD GLUCOSE TEST) test strip, The patient is insulin requiring, ICD 10 code E11.9. The patient tests 3 times per day., Disp: 100 each, Rfl: 12 .  hydrALAZINE (APRESOLINE) 100 MG tablet, Take 100 mg by mouth 2 (two) times daily., Disp: , Rfl:  .  hydrALAZINE (APRESOLINE) 25 MG tablet, Take 3 tablets (75 mg total) by mouth 2 (two) times daily., Disp: 90 tablet, Rfl: 1 .  hydroquinone 2 % cream, Apply topically 2 (two) times daily., Disp: 28.35 g, Rfl: 0 .  LORazepam (ATIVAN) 1 MG tablet, Take 1 mg by mouth daily as needed for anxiety., Disp: , Rfl:  .  metFORMIN (GLUCOPHAGE-XR) 500 MG 24 hr tablet, TAKE 2 TABLETS BY MOUTH  DAILY WITH BREAKFAST (Patient taking differently: Take 1,000 mg by mouth daily with breakfast. ), Disp: 180 tablet, Rfl: 3 .  Multiple Vitamin (MULTIVITAMIN) tablet, Take 1 tablet by mouth daily. , Disp: , Rfl:  .  olmesartan (BENICAR) 40 MG tablet, Take 1 tablet (40 mg total) by mouth every evening., Disp: 90 tablet, Rfl: 3 .  simvastatin (ZOCOR) 20 MG tablet, TAKE 1 TABLET BY MOUTH AT  BEDTIME (Patient taking differently: Take 20 mg by mouth at bedtime. ), Disp: 90 tablet, Rfl: 3 .  traMADol (ULTRAM) 50 MG tablet, Take 1 tablet (50 mg total) by mouth daily., Disp: 90 tablet, Rfl: 0 .  TRUEplus Lancets 33G MISC, The patient is insulin requiring, ICD 10 code E11.9. The patient tests 3 times per day., Disp: 100 each, Rfl: 12 .  famotidine (PEPCID) 20 MG tablet, Take 0.5 tablets (10 mg total) by mouth 2 (two) times daily. (Patient not taking: Reported on 12/15/2019), Disp: 30 tablet, Rfl: 1 .  loratadine (CLARITIN) 10 MG tablet, Take 1 tablet (10 mg total) by mouth  daily., Disp: 90 tablet, Rfl: 3  Allergies  Allergen Reactions  . Atorvastatin Nausea Only    REACTION: Nausea, abdominal pain, and reflux. Pt has used the medication in the past and experienced the same reaction before the medication was discontinued.  Marland Kitchen Hctz [Hydrochlorothiazide]     Blurry vision  . Norvasc [Amlodipine Besylate]     Blurry vision  . Rosuvastatin Itching  Objective:  Physical Exam  General: AAO x3, NAD  Dermatological: Skin is warm, dry and supple bilateral. There are no open sores, no preulcerative lesions, no rash or signs of infection present.  Vascular: Dorsalis Pedis artery and Posterior Tibial artery pedal pulses are 2/4 bilateral with immedate capillary fill time.  There is no pain with calf compression, swelling, warmth, erythema.   Neruologic: Grossly intact via light touch bilateral.  Sensation intact with Semmes Weinstein monofilament.  Vibratory sensation As well.  Musculoskeletal: No area of discomfort to the fifth metatarsal head laterally on both sides with left side worse than the right.  Small tailor's bunion is evident.  There is chronic edema present to bilateral lower extremities which is been ongoing for quite some time and not new.  Subjectively she having numbness to the balls of her feet and into the toes.  There is no palpable neuroma.  Muscular strength 5/5 in all groups tested bilateral.  Clinically she appears to have adequate arteries.  Gait: Unassisted, Nonantalgic.       Assessment:   69 year old female with type 2 diabetes with tailor's bunion, numbness     Plan:  -Treatment options discussed including all alternatives, risks, and complications -Etiology of symptoms were discussed -X-rays were obtained and reviewed with the patient.  No evidence of acute fracture or stress fracture.  Decreased calcaneal pitch angle. -I discussed the numbness could be resulted in either biomechanical changes of the foot versus  neuropathy or systemic issues.  Predominantly discussed if it is a foot issue.  We discussed changing shoes and she is better arch supports.  She will use metatarsal offloading pad as well.  We discussed vitamin B complex.  She has no pain associated numbness on hold off any other medications for now. -Regards to tailor's bunion discussed shoe modifications.  She can use Voltaren gel as well.  [name] DPM

## 2019-12-22 ENCOUNTER — Other Ambulatory Visit (HOSPITAL_COMMUNITY)
Admission: RE | Admit: 2019-12-22 | Discharge: 2019-12-22 | Disposition: A | Discharge status: Home / Self Care | Payer: Medicare Other | Source: Ambulatory Visit | Attending: Urology | Admitting: Urology

## 2019-12-22 DIAGNOSIS — Z20822 Contact with and (suspected) exposure to covid-19: Secondary | ICD-10-CM | POA: Insufficient documentation

## 2019-12-22 DIAGNOSIS — Z01812 Encounter for preprocedural laboratory examination: Secondary | ICD-10-CM | POA: Diagnosis present

## 2019-12-22 LAB — SARS CORONAVIRUS 2 (TAT 6-24 HRS): SARS Coronavirus 2: NEGATIVE

## 2019-12-24 ENCOUNTER — Other Ambulatory Visit: Payer: Self-pay | Admitting: Internal Medicine

## 2019-12-24 DIAGNOSIS — R0789 Other chest pain: Secondary | ICD-10-CM

## 2019-12-25 NOTE — Anesthesia Preprocedure Evaluation (Addendum)
Anesthesia Evaluation  Patient identified by MRN, date of birth, ID band Patient awake    Reviewed: Allergy & Precautions, NPO status , Patient's Chart, lab work & pertinent test results, reviewed documented beta blocker date and time   Airway Mallampati: II  TM Distance: >3 FB Neck ROM: Full    Dental no notable dental hx. (+) Teeth Intact, Dental Advisory Given   Pulmonary asthma , sleep apnea ,  hasnt used CPAP in 3-4years   Pulmonary exam normal breath sounds clear to auscultation       Cardiovascular hypertension, Pt. on medications and Pt. on home beta blockers Normal cardiovascular exam Rhythm:Regular Rate:Normal     Neuro/Psych  Headaches, PSYCHIATRIC DISORDERS Anxiety Depression    GI/Hepatic Neg liver ROS, GERD  Controlled,  Endo/Other  diabetes, Well Controlled, Type 2, Oral Hypoglycemic AgentsObesity BMI 34 a1c 6.3  Renal/GU Renal InsufficiencyRenal diseaseUrethral stricture  Cr 1.6  negative genitourinary   Musculoskeletal  (+) Arthritis , Osteoarthritis,    Abdominal (+) + obese,   Peds  Hematology  (+) Blood dyscrasia, anemia , hct 34.7, plt 264   Anesthesia Other Findings Hx breast ca L side s/p B/L mastectomy and chemo 2011  Reproductive/Obstetrics negative OB ROS                            Anesthesia Physical Anesthesia Plan  ASA: III  Anesthesia Plan: General   Post-op Pain Management:    Induction: Intravenous  PONV Risk Score and Plan: Ondansetron, Dexamethasone and Treatment may vary due to age or medical condition  Airway Management Planned: LMA  Additional Equipment: None  Intra-op Plan:   Post-operative Plan: Extubation in OR  Informed Consent: I have reviewed the patients History and Physical, chart, labs and discussed the procedure including the risks, benefits and alternatives for the proposed anesthesia with the patient or authorized representative  who has indicated his/her understanding and acceptance.     Dental advisory given  Plan Discussed with: CRNA  Anesthesia Plan Comments:        Anesthesia Quick Evaluation

## 2019-12-26 ENCOUNTER — Encounter (HOSPITAL_COMMUNITY)
Admission: RE | Disposition: A | Discharge status: Home / Self Care | Payer: Self-pay | Source: Home / Self Care | Attending: Urology

## 2019-12-26 ENCOUNTER — Ambulatory Visit (HOSPITAL_COMMUNITY)
Admission: RE | Admit: 2019-12-26 | Discharge: 2019-12-26 | Disposition: A | Discharge status: Home / Self Care | Payer: Medicare Other | Attending: Urology | Admitting: Urology

## 2019-12-26 ENCOUNTER — Encounter (HOSPITAL_COMMUNITY): Payer: Self-pay | Admitting: Urology

## 2019-12-26 ENCOUNTER — Ambulatory Visit (HOSPITAL_COMMUNITY): Payer: Medicare Other | Admitting: Anesthesiology

## 2019-12-26 ENCOUNTER — Ambulatory Visit (HOSPITAL_COMMUNITY): Payer: Medicare Other | Admitting: Physician Assistant

## 2019-12-26 DIAGNOSIS — N3592 Unspecified urethral stricture, female: Secondary | ICD-10-CM | POA: Insufficient documentation

## 2019-12-26 DIAGNOSIS — E1122 Type 2 diabetes mellitus with diabetic chronic kidney disease: Secondary | ICD-10-CM | POA: Insufficient documentation

## 2019-12-26 DIAGNOSIS — Z9013 Acquired absence of bilateral breasts and nipples: Secondary | ICD-10-CM | POA: Diagnosis not present

## 2019-12-26 DIAGNOSIS — Z79899 Other long term (current) drug therapy: Secondary | ICD-10-CM | POA: Diagnosis not present

## 2019-12-26 DIAGNOSIS — Z6834 Body mass index (BMI) 34.0-34.9, adult: Secondary | ICD-10-CM | POA: Diagnosis not present

## 2019-12-26 DIAGNOSIS — N183 Chronic kidney disease, stage 3 unspecified: Secondary | ICD-10-CM | POA: Diagnosis not present

## 2019-12-26 DIAGNOSIS — E669 Obesity, unspecified: Secondary | ICD-10-CM | POA: Insufficient documentation

## 2019-12-26 DIAGNOSIS — Z833 Family history of diabetes mellitus: Secondary | ICD-10-CM | POA: Diagnosis not present

## 2019-12-26 DIAGNOSIS — K219 Gastro-esophageal reflux disease without esophagitis: Secondary | ICD-10-CM | POA: Diagnosis not present

## 2019-12-26 DIAGNOSIS — Z8042 Family history of malignant neoplasm of prostate: Secondary | ICD-10-CM | POA: Insufficient documentation

## 2019-12-26 DIAGNOSIS — R519 Headache, unspecified: Secondary | ICD-10-CM | POA: Diagnosis not present

## 2019-12-26 DIAGNOSIS — E7800 Pure hypercholesterolemia, unspecified: Secondary | ICD-10-CM | POA: Insufficient documentation

## 2019-12-26 DIAGNOSIS — I129 Hypertensive chronic kidney disease with stage 1 through stage 4 chronic kidney disease, or unspecified chronic kidney disease: Secondary | ICD-10-CM | POA: Insufficient documentation

## 2019-12-26 DIAGNOSIS — Z8249 Family history of ischemic heart disease and other diseases of the circulatory system: Secondary | ICD-10-CM | POA: Diagnosis not present

## 2019-12-26 DIAGNOSIS — J45909 Unspecified asthma, uncomplicated: Secondary | ICD-10-CM | POA: Diagnosis not present

## 2019-12-26 DIAGNOSIS — Z853 Personal history of malignant neoplasm of breast: Secondary | ICD-10-CM | POA: Insufficient documentation

## 2019-12-26 DIAGNOSIS — Z9221 Personal history of antineoplastic chemotherapy: Secondary | ICD-10-CM | POA: Diagnosis not present

## 2019-12-26 DIAGNOSIS — G4730 Sleep apnea, unspecified: Secondary | ICD-10-CM | POA: Diagnosis not present

## 2019-12-26 DIAGNOSIS — M1990 Unspecified osteoarthritis, unspecified site: Secondary | ICD-10-CM | POA: Insufficient documentation

## 2019-12-26 DIAGNOSIS — N1330 Unspecified hydronephrosis: Secondary | ICD-10-CM | POA: Insufficient documentation

## 2019-12-26 DIAGNOSIS — Z7984 Long term (current) use of oral hypoglycemic drugs: Secondary | ICD-10-CM | POA: Diagnosis not present

## 2019-12-26 DIAGNOSIS — D649 Anemia, unspecified: Secondary | ICD-10-CM | POA: Insufficient documentation

## 2019-12-26 HISTORY — PX: CYSTOSCOPY WITH URETHRAL DILATATION: SHX5125

## 2019-12-26 LAB — GLUCOSE, CAPILLARY
Glucose-Capillary: 126 mg/dL — ABNORMAL HIGH (ref 70–99)
Glucose-Capillary: 130 mg/dL — ABNORMAL HIGH (ref 70–99)

## 2019-12-26 SURGERY — CYSTOSCOPY, WITH URETHRAL DILATION
Anesthesia: General | Site: Urethra

## 2019-12-26 MED ORDER — FENTANYL CITRATE (PF) 100 MCG/2ML IJ SOLN: 25.0000 ug | INTRAMUSCULAR | Status: DC | PRN

## 2019-12-26 MED ORDER — CHLORHEXIDINE GLUCONATE 0.12 % MT SOLN
15.0000 mL | Freq: Once | OROMUCOSAL | Status: AC
  Administered 2019-12-26: 15 mL via OROMUCOSAL

## 2019-12-26 MED ORDER — DEXAMETHASONE SODIUM PHOSPHATE 10 MG/ML IJ SOLN
INTRAMUSCULAR | Status: DC | PRN
  Administered 2019-12-26: 10 mg via INTRAVENOUS

## 2019-12-26 MED ORDER — LACTATED RINGERS IV SOLN: INTRAVENOUS | Status: DC

## 2019-12-26 MED ORDER — ONDANSETRON HCL 4 MG/2ML IJ SOLN: 4.0000 mg | Freq: Once | INTRAMUSCULAR | Status: DC | PRN

## 2019-12-26 MED ORDER — DEXAMETHASONE SODIUM PHOSPHATE 10 MG/ML IJ SOLN
INTRAMUSCULAR | Status: AC
  Filled 2019-12-26: qty 1

## 2019-12-26 MED ORDER — FENTANYL CITRATE (PF) 100 MCG/2ML IJ SOLN
INTRAMUSCULAR | Status: DC | PRN
Start: 2019-12-26 — End: 2019-12-26
  Administered 2019-12-26 (×2): 50 ug via INTRAVENOUS

## 2019-12-26 MED ORDER — PROPOFOL 10 MG/ML IV BOLUS
INTRAVENOUS | Status: DC | PRN
  Administered 2019-12-26: 30 mg via INTRAVENOUS
  Administered 2019-12-26: 150 mg via INTRAVENOUS

## 2019-12-26 MED ORDER — OXYCODONE HCL 5 MG/5ML PO SOLN: 5.0000 mg | Freq: Once | ORAL | Status: DC | PRN

## 2019-12-26 MED ORDER — ACETAMINOPHEN 500 MG PO TABS
1000.0000 mg | ORAL_TABLET | Freq: Once | ORAL | Status: AC
  Administered 2019-12-26: 1000 mg via ORAL
  Filled 2019-12-26: qty 2

## 2019-12-26 MED ORDER — EPHEDRINE SULFATE-NACL 50-0.9 MG/10ML-% IV SOSY
PREFILLED_SYRINGE | INTRAVENOUS | Status: DC | PRN
  Administered 2019-12-26: 10 mg via INTRAVENOUS

## 2019-12-26 MED ORDER — CIPROFLOXACIN IN D5W 400 MG/200ML IV SOLN
400.0000 mg | INTRAVENOUS | Status: AC
  Administered 2019-12-26: 200 mg via INTRAVENOUS
  Filled 2019-12-26: qty 200

## 2019-12-26 MED ORDER — SODIUM CHLORIDE 0.9 % IR SOLN
Status: DC | PRN
  Administered 2019-12-26: 3000 mL via INTRAVESICAL

## 2019-12-26 MED ORDER — PROPOFOL 10 MG/ML IV BOLUS
INTRAVENOUS | Status: AC
  Filled 2019-12-26: qty 20

## 2019-12-26 MED ORDER — EPHEDRINE 5 MG/ML INJ
INTRAVENOUS | Status: AC
  Filled 2019-12-26: qty 10

## 2019-12-26 MED ORDER — LACTATED RINGERS IV SOLN: INTRAVENOUS | Status: DC | PRN

## 2019-12-26 MED ORDER — LIDOCAINE 2% (20 MG/ML) 5 ML SYRINGE
INTRAMUSCULAR | Status: AC
  Filled 2019-12-26: qty 5

## 2019-12-26 MED ORDER — ONDANSETRON HCL 4 MG/2ML IJ SOLN
INTRAMUSCULAR | Status: AC
  Filled 2019-12-26: qty 2

## 2019-12-26 MED ORDER — FENTANYL CITRATE (PF) 100 MCG/2ML IJ SOLN
INTRAMUSCULAR | Status: AC
  Filled 2019-12-26: qty 2

## 2019-12-26 MED ORDER — OXYCODONE HCL 5 MG PO TABS: 5.0000 mg | ORAL_TABLET | Freq: Once | ORAL | Status: DC | PRN

## 2019-12-26 MED ORDER — ONDANSETRON HCL 4 MG/2ML IJ SOLN
INTRAMUSCULAR | Status: DC | PRN
  Administered 2019-12-26: 4 mg via INTRAVENOUS

## 2019-12-26 MED ORDER — LIDOCAINE 2% (20 MG/ML) 5 ML SYRINGE
INTRAMUSCULAR | Status: DC | PRN
  Administered 2019-12-26: 100 mg via INTRAVENOUS

## 2019-12-26 MED ORDER — ORAL CARE MOUTH RINSE: 15.0000 mL | Freq: Once | OROMUCOSAL | Status: AC

## 2019-12-26 SURGICAL SUPPLY — 19 items
BAG DRN RND TRDRP ANRFLXCHMBR (UROLOGICAL SUPPLIES) ×1
BAG URINE DRAIN 2000ML AR STRL (UROLOGICAL SUPPLIES) ×2 IMPLANT
BALLN NEPHROSTOMY (BALLOONS)
BALLOON NEPHROSTOMY (BALLOONS) IMPLANT
CATH FOLEY 2W COUNCIL 20FR 5CC (CATHETERS) IMPLANT
CATH INTERMIT  6FR 70CM (CATHETERS) IMPLANT
CATH ROBINSON RED A/P 14FR (CATHETERS) ×1 IMPLANT
CATH URET 5FR 28IN CONE TIP (BALLOONS)
CATH URET 5FR 70CM CONE TIP (BALLOONS) IMPLANT
CLOTH BEACON ORANGE TIMEOUT ST (SAFETY) ×2 IMPLANT
GLOVE BIO SURGEON STRL SZ 6.5 (GLOVE) ×2 IMPLANT
GOWN STRL REUS W/TWL LRG LVL3 (GOWN DISPOSABLE) ×2 IMPLANT
GUIDEWIRE ANG ZIPWIRE 038X150 (WIRE) IMPLANT
GUIDEWIRE STR DUAL SENSOR (WIRE) ×2 IMPLANT
KIT TURNOVER KIT A (KITS) IMPLANT
MANIFOLD NEPTUNE II (INSTRUMENTS) IMPLANT
NS IRRIG 1000ML POUR BTL (IV SOLUTION) IMPLANT
PACK CYSTO (CUSTOM PROCEDURE TRAY) ×2 IMPLANT
WATER STERILE IRR 3000ML UROMA (IV SOLUTION) ×2 IMPLANT

## 2019-12-26 NOTE — Anesthesia Procedure Notes (Addendum)
Procedure Name: LMA Insertion Performed by: Mitzie Na, CRNA Pre-anesthesia Checklist: Patient identified Patient Re-evaluated:Patient Re-evaluated prior to induction Oxygen Delivery Method: Circle system utilized Preoxygenation: Pre-oxygenation with 100% oxygen Induction Type: IV induction LMA: LMA inserted LMA Size: 4.0 Placement Confirmation: positive ETCO2 and breath sounds checked- equal and bilateral Tube secured with: Tape Dental Injury: Teeth and Oropharynx as per pre-operative assessment  Comments: perfomed by Jacqualine Code

## 2019-12-26 NOTE — Interval H&P Note (Signed)
History and Physical Interval Note:  12/26/2019 9:34 AM  [name]  has presented today for surgery, with the diagnosis of URETHRAL STRICTURE.  The various methods of treatment have been discussed with the patient and family. After consideration of risks, benefits and other options for treatment, the patient has consented to  Procedure(s) with comments: CYSTOSCOPY WITH URETHRAL DILATATION (N/A) - 30 MINS as a surgical intervention.  The patient's history has been reviewed, patient examined, no change in status, stable for surgery.  I have reviewed the patient's chart and labs.  Questions were answered to the patient's satisfaction.     Conner Neiss D Melvin Whiteford

## 2019-12-26 NOTE — H&P (Signed)
CC/HPI: cc: Hydronephrosis   12/07/2019: 68-year-old woman with a history of CKD stage 3 referred by Nephrology. Patient states when she is urinating she feels like she has a very narrow stream it is coming out of a pinhole. She underwent urethral dilation around age 38. Her most recent renal ultrasound per nephrology no showed mild hydronephrosis. There was a renal ultrasound December 2020 that did not show any evidence of urinary obstruction. PVR in the office 39 mL. She is not getting urinary tract infections. She denies gross hematuria.     ALLERGIES: No Known Drug Allergies    MEDICATIONS: Simvastatin 20 mg tablet  Albuterol Sulfate Hfa 90 mcg hfa aerosol with adapter  Amlodipine Besylate 5 mg tablet  Azelastine Hcl 137 mcg (0.1 %) aerosol, spray with pump  Carvedilol 25 mg tablet  Cholecalciferol  Diclofenac Sodium 1 % gel  Fluticasone Propionate 50 mcg/actuation spray, suspension  Hydralazine Hcl 25 mg tablet  Hydroquinone  Loratadine 10 mg tablet  Lorazepam  Metformin Er Gastric 500 mg tablet, er gastric retention 24 hr  Multivitamin  Olmesartan Medoxomil 40 mg tablet  Tramadol Hcl 50 mg tablet     GU PSH: Hysterectomy       PSH Notes: mastectomy   NON-GU PSH: None   GU PMH: None   NON-GU PMH: Anxiety Arthritis Asthma Diabetes Type 2 GERD Hypercholesterolemia Hypertension Sleep Apnea    FAMILY HISTORY: Diabetes - Brother, Sister Hypertension - Sister, Brother Prostate Cancer - Father   SOCIAL HISTORY: Marital Status: Single Preferred Language: English; Race: Black or African American Current Smoking Status: Patient has never smoked.   Tobacco Use Assessment Completed: Used Tobacco in last 30 days? Does not drink anymore.  Drinks 2 caffeinated drinks per day.    REVIEW OF SYSTEMS:    GU Review Female:   Patient reports get up at night to urinate, leakage of urine, and stream starts and stops. Patient denies frequent urination, hard to postpone  urination, burning /pain with urination, trouble starting your stream, have to strain to urinate, and being pregnant.  Gastrointestinal (Upper):   Patient denies nausea, vomiting, and indigestion/ heartburn.  Gastrointestinal (Lower):   Patient denies diarrhea and constipation.  Constitutional:   Patient denies fever, night sweats, weight loss, and fatigue.  Skin:   Patient denies skin rash/ lesion and itching.  Eyes:   Patient denies blurred vision and double vision.  Ears/ Nose/ Throat:   Patient denies sore throat and sinus problems.  Hematologic/Lymphatic:   Patient denies swollen glands and easy bruising.  Cardiovascular:   Patient denies leg swelling and chest pains.  Respiratory:   Patient denies cough and shortness of breath.  Endocrine:   Patient denies excessive thirst.  Musculoskeletal:   Patient reports back pain. Patient denies joint pain.  Neurological:   Patient denies headaches and dizziness.  Psychologic:   Patient denies depression and anxiety.   VITAL SIGNS:      12/07/2019 10:58 AM  Weight 231 lb / 104.78 kg  Height 68 in / 172.72 cm  BP 160/85 mmHg  Pulse 80 /min  Temperature 98.0 F / 36.6 C  BMI 35.1 kg/m   GU PHYSICAL EXAMINATION:    External Genitalia: No hirsutism, no rash, no scarring, no cyst, no erythematous lesion, no papular lesion, no blanched lesion, no warty lesion. No edema.  Urethral Meatus: Urethral meatus normal location and appears to have normal size however when probed with Q-tip did not feel open  Cervix: S/P Hysterectomy  Uterus:   S/P Hysterectomy   MULTI-SYSTEM PHYSICAL EXAMINATION:    Constitutional: Well-nourished. No physical deformities. Normally developed. Good grooming.  Neck: Neck symmetrical, not swollen. Normal tracheal position.  Respiratory: No labored breathing, no use of accessory muscles.   Cardiovascular: Normal temperature  Skin: No paleness, no jaundice, no cyanosis. No lesion, no ulcer, no rash.  Neurologic /  Psychiatric: Oriented to time, oriented to place, oriented to person. No depression, no anxiety, no agitation.  Gastrointestinal: No rigidity, non obese abdomen.   Eyes: Normal conjunctivae. Normal eyelids.  Ears, Nose, Mouth, and Throat: Left ear no scars, no lesions, no masses. Right ear no scars, no lesions, no masses. Nose no scars, no lesions, no masses. Normal hearing. Normal lips.  Musculoskeletal: Normal gait and station of head and neck.     Complexity of Data:  Records Review:   POC Tool  Urine Test Review:   Urinalysis  Urodynamics Review:   Review Bladder Scan  Notes:                     11/29/2019: BUN 19, creatinine 1.83, eGFR 41   PROCEDURES:         PVR Ultrasound - 21975  Scanned Volume: 39 cc         Urinalysis w/Scope Dipstick Dipstick Cont'd Micro  Color: Yellow Bilirubin: Neg mg/dL WBC/hpf: 20 - 40/hpf  Appearance: Cloudy Ketones: Neg mg/dL RBC/hpf: 3 - 10/hpf  Specific Gravity: 1.020 Blood: Trace ery/uL Bacteria: Few (10-25/hpf)  pH: 6.0 Protein: Trace mg/dL Cystals: NS (Not Seen)  Glucose: Neg mg/dL Urobilinogen: 0.2 mg/dL Casts: NS (Not Seen)    Nitrites: Neg Trichomonas: Not Present    Leukocyte Esterase: 3+ leu/uL Mucous: Not Present      Epithelial Cells: 0 - 5/hpf      Yeast: NS (Not Seen)      Sperm: Not Present    ASSESSMENT:      ICD-10 Details  1 GU:   Weak Urinary Stream - R39.12 Undiagnosed New Problem - Attempts that probing urethral meatus were unsuccessful in the office and quite painful to the patient. We discussed proceeding with diagnostic cystourethroscopy and possible urethral dilation. Based on these findings we may repeat upper tract imaging to see if hydronephrosis has resolved. We discussed the risks and benefits of the procedure including but not limited to dysuria, bleeding, infection, damage to surrounding structures, need for future intervention.  2   Incomplete bladder emptying - R39.14 Undiagnosed New Problem   PLAN:            Document Letter(s):  Created for Patient: Clinical Summary

## 2019-12-26 NOTE — Discharge Instructions (Signed)

## 2019-12-26 NOTE — Transfer of Care (Signed)
Immediate Anesthesia Transfer of Care Note  Patient: [name]  Procedure(s) Performed: CYSTOSCOPY WITH URETHRAL DILATATION (N/A Urethra)  Patient Location: PACU  Anesthesia Type:General  Level of Consciousness: awake, alert , oriented and patient cooperative  Airway & Oxygen Therapy: Patient Spontanous Breathing and Patient connected to face mask oxygen  Post-op Assessment: Report given to RN, Post -op Vital signs reviewed and stable and Patient moving all extremities  Post vital signs: Reviewed and stable  Last Vitals:  Vitals Value Taken Time  BP 141/75 12/26/19 1138  Temp    Pulse 84 12/26/19 1140  Resp 14 12/26/19 1138  SpO2 100 % 12/26/19 1140  Vitals shown include unvalidated device data.  Last Pain:  Vitals:   12/26/19 0959  TempSrc: Oral  PainSc:          Complications: No complications documented.

## 2019-12-26 NOTE — Anesthesia Postprocedure Evaluation (Signed)
Anesthesia Post Note  Patient: [name]  Procedure(s) Performed: CYSTOSCOPY WITH URETHRAL DILATATION (N/A Urethra)     Patient location during evaluation: PACU Anesthesia Type: General Level of consciousness: awake and alert, oriented and patient cooperative Pain management: pain level controlled Vital Signs Assessment: post-procedure vital signs reviewed and stable Respiratory status: spontaneous breathing, nonlabored ventilation and respiratory function stable Cardiovascular status: blood pressure returned to baseline and stable Postop Assessment: no apparent nausea or vomiting Anesthetic complications: no   No complications documented.  Last Vitals:  Vitals:   12/26/19 1138 12/26/19 1145  BP: (!) 141/75 (!) 142/72  Pulse: 84 79  Resp: 14   Temp: 36.9 C   SpO2: 100% 100%    Last Pain:  Vitals:   12/26/19 1138  TempSrc:   PainSc: 0-No pain                 Pervis Hocking

## 2019-12-26 NOTE — Op Note (Signed)
Operative Note  Preoperative diagnosis:  1.  Urethral stricture  Postoperative diagnosis: 1.  Same  Procedure(s): 1.  Cystoscopy with urethral dilation  Surgeon: Jacalyn Lefevre, MD  Assistants:  None  Anesthesia:  General  Complications:  None  EBL:  0 mL  Specimens: 1. none  Drains/Catheters: 1.  none  Intraoperative findings:   1. Urethra with mild stenosis dilated from 16Fr to 26Fr without difficulty 2. Cystoscopy revealed bilateral orthotopic ureteral orifices  3. Bladder mucosa with evidence of cystitis cystica and scattered bullous edema; no bladder masses seen   Indication:  [name] is a 69 y.o. female  with a history of CKD stage 3 referred by Nephrology. Patient states when she is urinating she feels like she has a very narrow stream it is coming out of a pinhole. She underwent urethral dilation around age 89.   Description of procedure: After risks and benefits of procedure were discussed, informed consent was obtained.  The patient was taken to the operating room and administered general anesthesia. They were then placed on the table and moved to the dorsal lithotomy position after which the genitalia was sterilely prepped and draped. An official timeout was then performed.  The 17 Fr rigid cystoscope was placed in the urethral meatus and gentle pressure was met.  With mild resistance, the scope was navigated into the bladder.  Cystoscopy took place and revealed evidence of cystitis cystica/chronic cystitis.  The cystoscope was removed and the urethra was then dilated from 16Fr to 26Fr.  The bladder was decompressed.    The patient emerged from anesthesia and was taken to PACU in stable condition.    Plan:  She will be discharge home and have follow up PVR in of the office in 2 weeks.  She will then be scheduled for repeat renal US in 6 weeks.

## 2019-12-27 ENCOUNTER — Encounter (HOSPITAL_COMMUNITY): Payer: Self-pay | Admitting: Urology

## 2020-02-22 ENCOUNTER — Other Ambulatory Visit: Payer: Self-pay | Admitting: *Deleted

## 2020-02-22 DIAGNOSIS — J302 Other seasonal allergic rhinitis: Secondary | ICD-10-CM

## 2020-02-22 MED ORDER — FLUTICASONE PROPIONATE 50 MCG/ACT NA SUSP: NASAL | 1 refills | Status: DC

## 2020-02-26 ENCOUNTER — Other Ambulatory Visit: Payer: Self-pay

## 2020-02-26 DIAGNOSIS — J45991 Cough variant asthma: Secondary | ICD-10-CM

## 2020-02-26 DIAGNOSIS — J302 Other seasonal allergic rhinitis: Secondary | ICD-10-CM

## 2020-02-26 NOTE — Telephone Encounter (Signed)
Need refill on albuterol (VENTOLIN HFA) 108 (90 Base) MCG/ACT inhaler LORazepam (ATIVAN) 1 MG tablet  fluticasone (FLONASE) 50 MCG/ACT nasal spray ;pt contact [facility], [address] [place], [address]

## 2020-02-28 MED ORDER — ALBUTEROL SULFATE HFA 108 (90 BASE) MCG/ACT IN AERS: INHALATION_SPRAY | RESPIRATORY_TRACT | 4 refills | Status: DC

## 2020-03-08 ENCOUNTER — Telehealth: Payer: Self-pay | Admitting: Hematology and Oncology

## 2020-03-08 DIAGNOSIS — L819 Disorder of pigmentation, unspecified: Secondary | ICD-10-CM | POA: Diagnosis not present

## 2020-03-08 NOTE — Telephone Encounter (Signed)
Scheduled appt per 12/17 incoming call from pt requesting appt with Dr. Lindi Adie. Pt confirmed appt date and time.

## 2020-03-11 DIAGNOSIS — N1832 Chronic kidney disease, stage 3b: Secondary | ICD-10-CM | POA: Diagnosis not present

## 2020-03-12 IMAGING — US US RENAL
1 series · 13 of 25 positions shown · non-contrast
Comparison: 05/28/2014

CLINICAL DATA: Stage III chronic kidney disease, worsening renal
function, history hypertension, kidney stones

EXAM:
RENAL / URINARY TRACT ULTRASOUND COMPLETE

[Series 1: us renal · 13 of 47 slices shown]
[im 1/47]
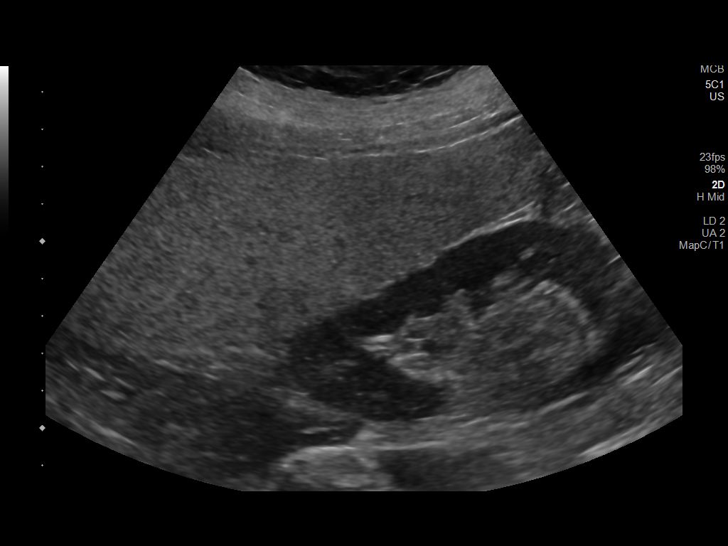
[im 4/47]
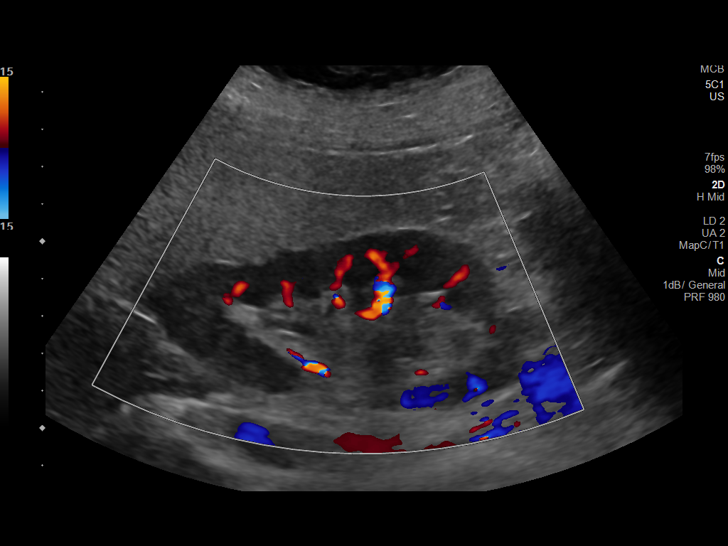
[im 8/47]
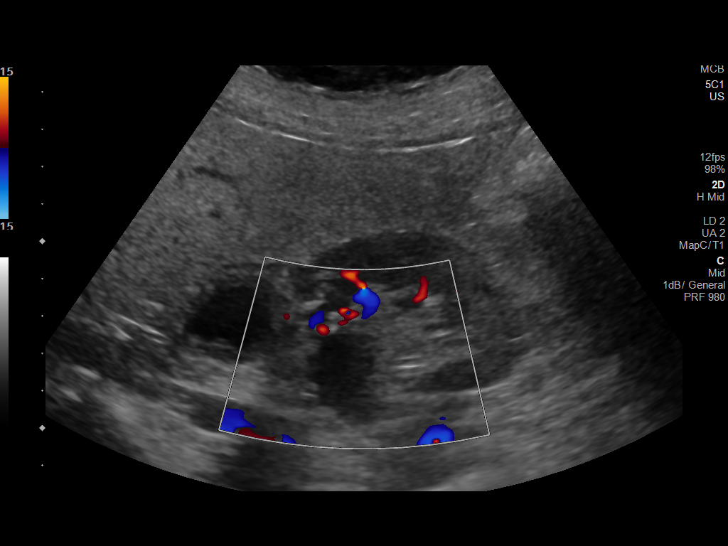
[im 12/47]
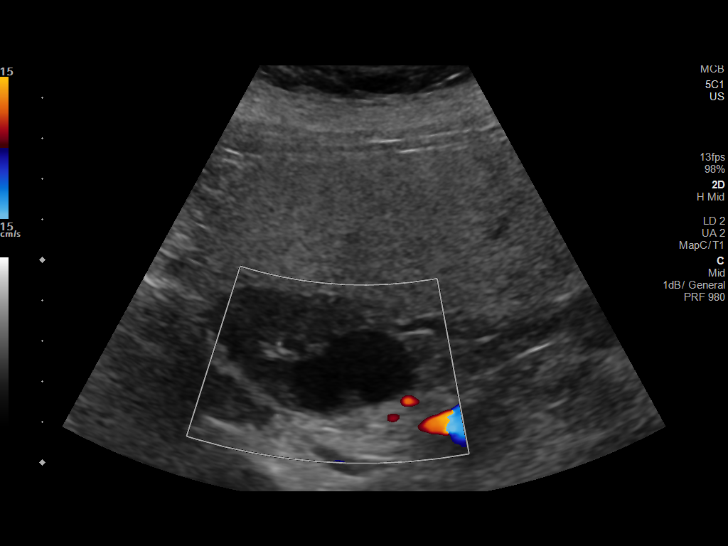
[im 16/47]
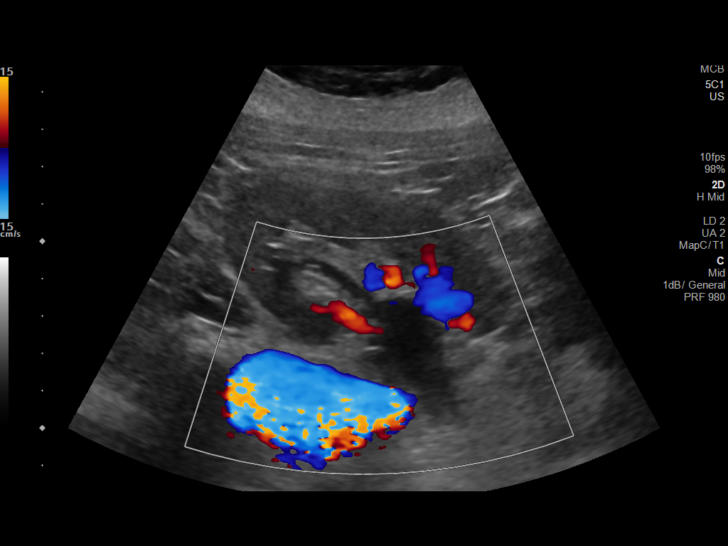
[im 20/47]
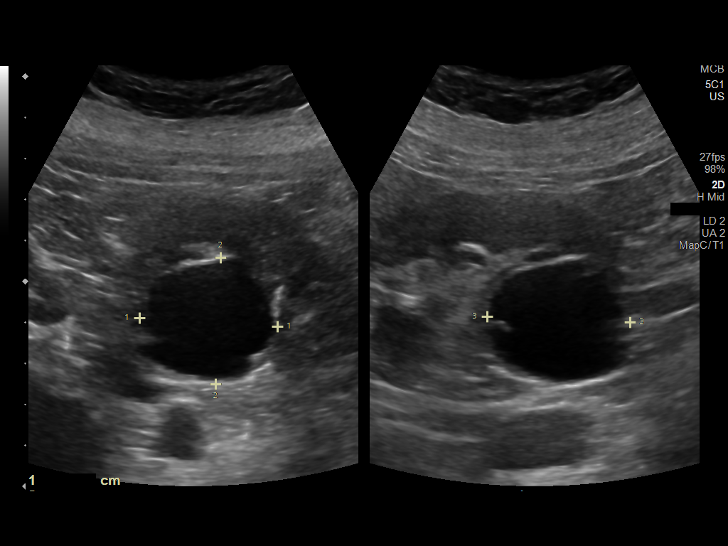
[im 24/47]
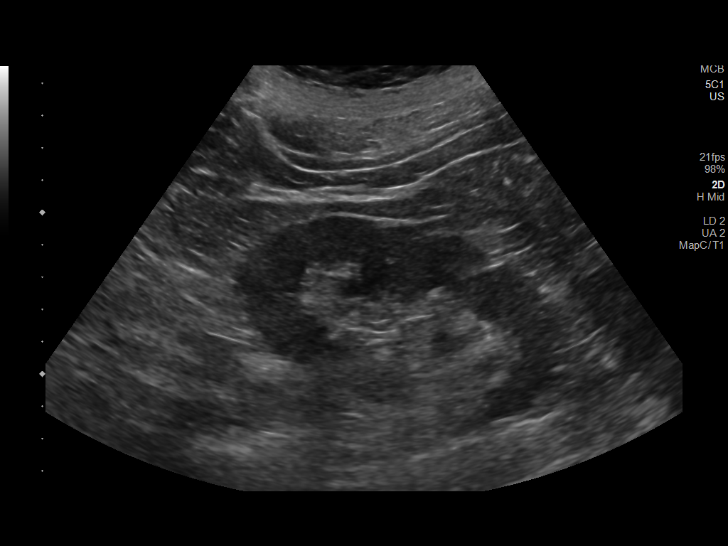
[im 27/47]
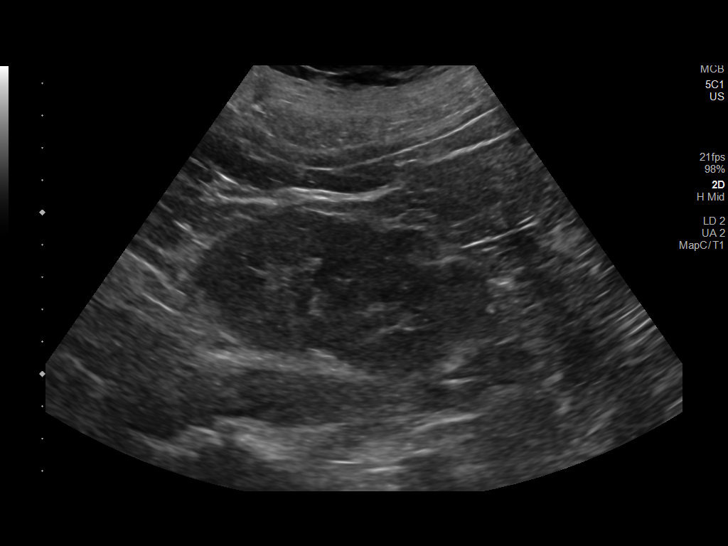
[im 31/47]
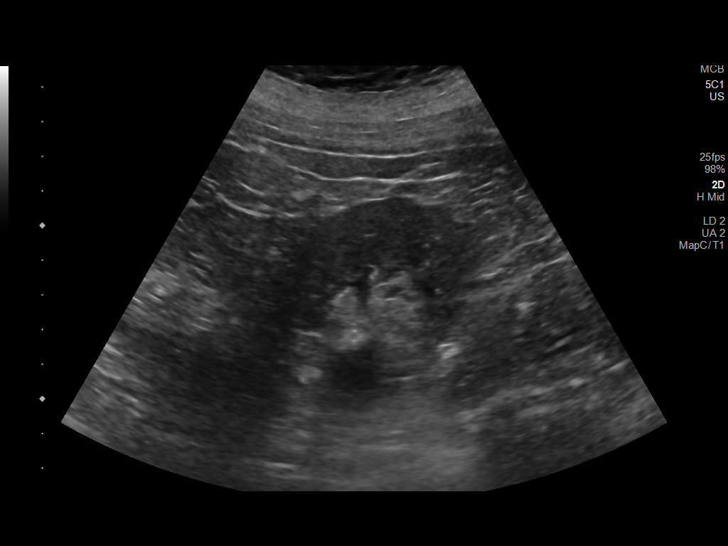
[im 35/47]
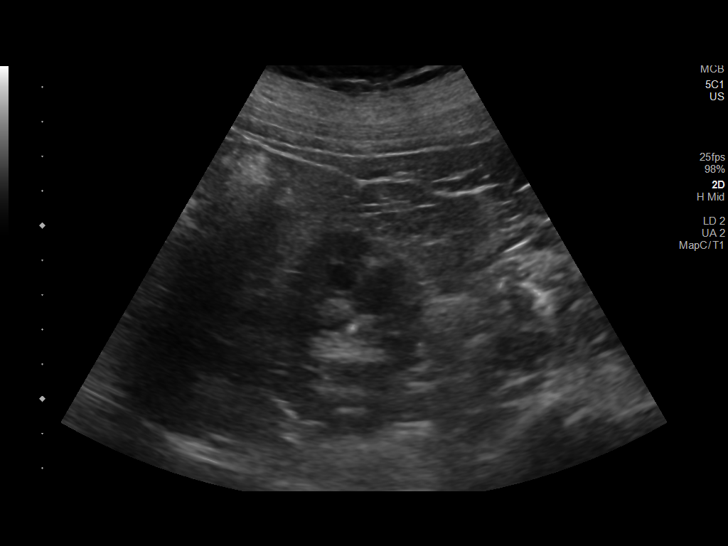
[im 39/47]
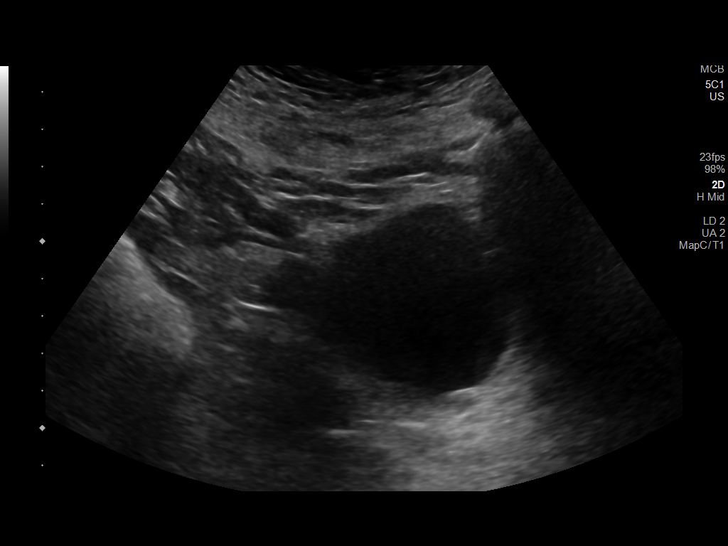
[im 43/47]
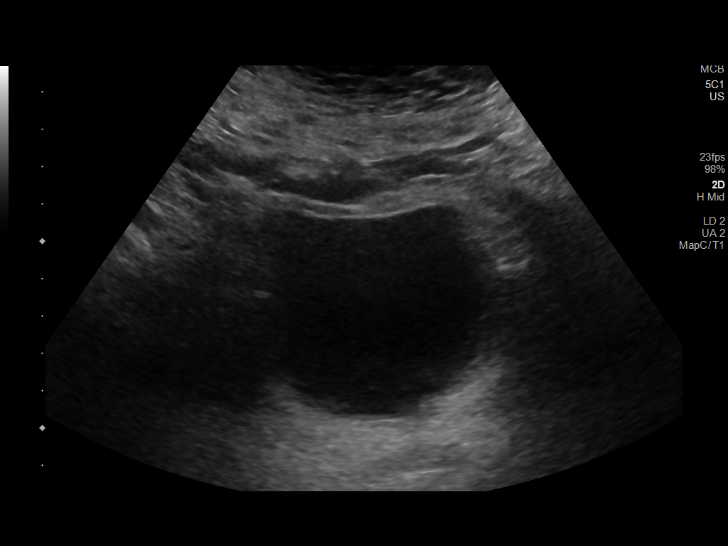
[im 47/47]
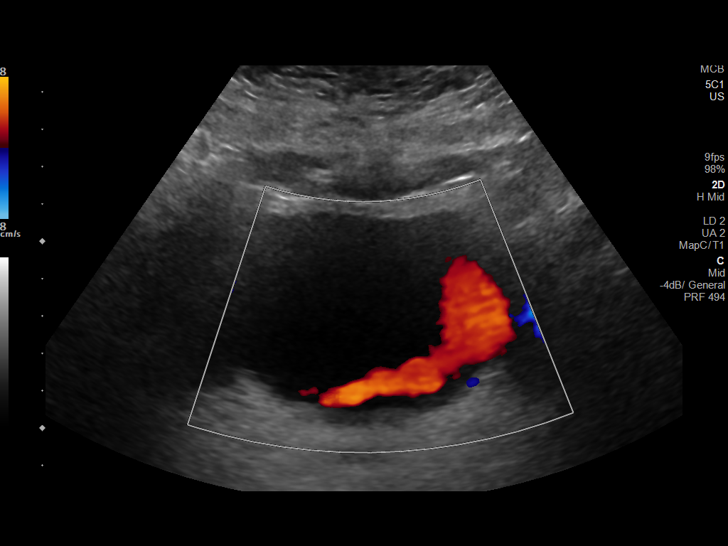

[13 of 25 positions shown; findings below may reference images not displayed]

FINDINGS: Right Kidney:

Renal measurements: 10.9 x 5.0 x 5.4 cm = volume: 156 mL. Normal
cortical thickness and echogenicity. Small cyst at upper pole,
slightly bilobed appearance, 3.3 x 2.1 x 2.3 cm.

Additional cyst at lower pole 3.5 x 3.4 x 3.1 cm, with a partial
septation. Mild RIGHT hydronephrosis present new since previous
exam. No additional mass or shadowing calculi.

Left Kidney:

Renal measurements: 10.6 x 5.9 x 5.6 cm = volume: 184 mL. Normal
cortical thickness and echogenicity. No mass or shadowing
calcification. Mild hydronephrosis, new since prior exam. Small cyst
seen previously at the inferior pole the LEFT kidney is not
definitely visualized.

Bladder:

Normal appearance.  Ureteral jets are seen bilaterally.

Incidentally noted increased echogenicity of the liver, most
commonly seen with fatty infiltration of this can be seen with
cirrhosis and certain infiltrative disorders.
IMPRESSION: Mild BILATERAL renal collecting system dilatation/hydronephrosis new
since previous exam.

This is of uncertain etiology, with bladder not appearing overly
distended and BILATERAL ureteral jets visualized at the bladder.

RIGHT renal cysts mildly increased in sizes since the previous exam.

## 2020-03-13 ENCOUNTER — Telehealth: Payer: Self-pay

## 2020-03-13 ENCOUNTER — Other Ambulatory Visit: Payer: Self-pay | Admitting: Student

## 2020-03-13 ENCOUNTER — Encounter: Payer: Medicare Other | Admitting: Internal Medicine

## 2020-03-13 ENCOUNTER — Telehealth: Payer: Self-pay | Admitting: *Deleted

## 2020-03-13 DIAGNOSIS — N390 Urinary tract infection, site not specified: Secondary | ICD-10-CM | POA: Diagnosis not present

## 2020-03-13 MED ORDER — ALBUTEROL SULFATE HFA 108 (90 BASE) MCG/ACT IN AERS
1.0000 | INHALATION_SPRAY | Freq: Four times a day (QID) | RESPIRATORY_TRACT | 11 refills | Status: DC | PRN
Start: 2020-03-13 — End: 2020-04-30

## 2020-03-13 MED ORDER — HYDROCORTISONE (PERIANAL) 2.5 % EX CREA: 1.0000 "application " | TOPICAL_CREAM | Freq: Two times a day (BID) | CUTANEOUS | 0 refills | Status: DC

## 2020-03-13 NOTE — Telephone Encounter (Signed)
Patient called in requesting refill on proctozone-HC 2.5 % for hemorrhoid relief. No longer on current med list. Hubbard Hartshorn, BSN, RN-BC

## 2020-03-13 NOTE — Telephone Encounter (Signed)
error 

## 2020-03-13 NOTE — Telephone Encounter (Signed)
Patient notified and is very appreciative. L. Regenia Erck, BSN, RN-BC  ° °

## 2020-03-13 NOTE — Telephone Encounter (Signed)
Received following faxed message from pharmacy:  Medical Clarification Request  "Ventolin HFA is not covered under your patient's insurance.   Covered Alternatives: Albuterol HFA (generic ProAir) ($$) Proair Respiclick; Proair HFA ($$$)  At this time, this prescription will not be filled for your patient unless we receive clarification."  Will forward to red team. Thank you! SChaplin, RN,BSN c

## 2020-03-13 NOTE — Telephone Encounter (Signed)
Albuterol HFA ordered.

## 2020-03-13 NOTE — Telephone Encounter (Signed)
I have sent a prescription for proctozone to patient's pharmacy.

## 2020-03-19 ENCOUNTER — Ambulatory Visit: Payer: Medicare Other | Admitting: Podiatry

## 2020-03-19 DIAGNOSIS — C50911 Malignant neoplasm of unspecified site of right female breast: Secondary | ICD-10-CM | POA: Diagnosis not present

## 2020-03-19 DIAGNOSIS — C50912 Malignant neoplasm of unspecified site of left female breast: Secondary | ICD-10-CM | POA: Diagnosis not present

## 2020-03-20 DIAGNOSIS — E1122 Type 2 diabetes mellitus with diabetic chronic kidney disease: Secondary | ICD-10-CM | POA: Diagnosis not present

## 2020-03-20 DIAGNOSIS — R809 Proteinuria, unspecified: Secondary | ICD-10-CM | POA: Diagnosis not present

## 2020-03-20 DIAGNOSIS — D649 Anemia, unspecified: Secondary | ICD-10-CM | POA: Diagnosis not present

## 2020-03-20 DIAGNOSIS — N1832 Chronic kidney disease, stage 3b: Secondary | ICD-10-CM | POA: Diagnosis not present

## 2020-03-20 DIAGNOSIS — I129 Hypertensive chronic kidney disease with stage 1 through stage 4 chronic kidney disease, or unspecified chronic kidney disease: Secondary | ICD-10-CM | POA: Diagnosis not present

## 2020-03-26 ENCOUNTER — Other Ambulatory Visit: Payer: Self-pay

## 2020-03-26 ENCOUNTER — Encounter: Payer: Self-pay | Admitting: Internal Medicine

## 2020-03-26 ENCOUNTER — Ambulatory Visit (INDEPENDENT_AMBULATORY_CARE_PROVIDER_SITE_OTHER): Payer: Medicare Other | Admitting: Internal Medicine

## 2020-03-26 VITALS — BP 156/88 | HR 96 | Temp 98.5°F | Ht 68.0 in | Wt 234.3 lb

## 2020-03-26 DIAGNOSIS — M25561 Pain in right knee: Secondary | ICD-10-CM | POA: Diagnosis not present

## 2020-03-26 DIAGNOSIS — M1711 Unilateral primary osteoarthritis, right knee: Secondary | ICD-10-CM

## 2020-03-26 DIAGNOSIS — K219 Gastro-esophageal reflux disease without esophagitis: Secondary | ICD-10-CM | POA: Diagnosis not present

## 2020-03-26 DIAGNOSIS — L811 Chloasma: Secondary | ICD-10-CM | POA: Diagnosis not present

## 2020-03-26 DIAGNOSIS — M533 Sacrococcygeal disorders, not elsewhere classified: Secondary | ICD-10-CM

## 2020-03-26 DIAGNOSIS — S8991XA Unspecified injury of right lower leg, initial encounter: Secondary | ICD-10-CM | POA: Diagnosis not present

## 2020-03-26 MED ORDER — DICLOFENAC SODIUM 1 % EX GEL: CUTANEOUS | 0 refills | Status: DC

## 2020-03-26 MED ORDER — HYDROQUINONE 2 % EX CREA: TOPICAL_CREAM | Freq: Two times a day (BID) | CUTANEOUS | 0 refills | Status: DC

## 2020-03-26 MED ORDER — FAMOTIDINE 20 MG PO TABS: 20.0000 mg | ORAL_TABLET | Freq: Every day | ORAL | 0 refills | Status: DC

## 2020-03-26 NOTE — Patient Instructions (Signed)
Thank you, [name] for allowing [place] to provide your care today. Today we discussed knee pain, lower back pain, and reflux.    I have ordered the following labs for you:  Lab Orders  No laboratory test(s) ordered today     Tests ordered today:  none  Referrals ordered today:   Referral Orders  No referral(s) requested today     I have ordered the following medication/changed the following medications:   Stop the following medications:  None  Start the following medications: Meds ordered this encounter  Medications  . diclofenac Sodium (VOLTAREN) 1 % GEL    Sig: APPLY 4 GRAMS TOPICALLY 4  TIMES DAILY AS NEEDED    Dispense:  100 g    Refill:  0  . famotidine (PEPCID) 20 MG tablet    Sig: Take 1 tablet (20 mg total) by mouth daily.    Dispense:  30 tablet    Refill:  0     Follow up: 2-3 weeks    Remember: Please apply the voltaren gel to your knees and lower back as needed up to 4 time daily. If you knee remains swollen please contact the clinic so we can reexamine the knee.  Should you have any questions or concerns please call the internal medicine clinic at [phone].     [name], D.O. [place]

## 2020-03-26 NOTE — Progress Notes (Signed)
CC: Sacral back pain   HPI:  [name] is a 70 y.o. female with a past medical history stated below and presents today for Sacral back pain. Please see problem based assessment and plan for additional details.  Past Medical History:  Diagnosis Date  . Anemia   . Anxiety   . Arthritis    DJD, lumbar spondylosis  . Asthma   . Back pain   . Bilateral renal cysts   . Breast cancer ([place])    left breast invasive ductal ca in remission as of 10/07/11 s/p double mastectomy  . Breast cancer of upper-outer quadrant of left female breast ([place]) 01/15/2010   Per Dr. Laurelyn Sickle last clinic note 04/2012:  stage II a invasive ductal carcinoma of the left breast diagnosed in October 2011. She was found to have a ER/PR positive HER-2/neu positive breast cancer.She underwent bilateral mastectomies. Her right mastectomy was an elective prophylactic mastectomy due to her on comfort. Patient subsequently underwent adjuvant chemotherapy consisting of Taxotere carbop  . Bronchitis 03/10/2012  . Cataract   . Chemotherapy follow-up examination 03/06/2011  . Colon polyps   . Cough 03/06/2011  . Depression    seasonal melancholia  . E. coli UTI   . Edema extremities   . GERD (gastroesophageal reflux disease)   . History of migraine headaches   . Hyperlipidemia   . Hypertension   . Ileitis   . Kidney stones    non obstructive   . Myopathy    not statin related, cause unknown     Current Outpatient Medications on File Prior to Visit  Medication Sig Dispense Refill  . albuterol (PROAIR HFA) 108 (90 Base) MCG/ACT inhaler Inhale 1-2 puffs into the lungs every 6 (six) hours as needed for wheezing or shortness of breath. 6.7 g 11  . amLODipine (NORVASC) 5 MG tablet TAKE 1 TABLET BY MOUTH  DAILY 90 tablet 3  . azelastine (ASTELIN) 0.1 % nasal spray PLACE 2 SPRAYS INTO BOTH NOSTRILS AT BEDTIME AS NEEDED FOR RHINITIS. 30 mL 1  . beclomethasone (QVAR) 80 MCG/ACT inhaler Inhale 2 puffs into the lungs 2  (two) times daily. 1 Inhaler 0  . Blood Glucose Calibration (TRUE METRIX LEVEL 1) Low SOLN Please use as directed 3 each 0  . Blood Glucose Monitoring Suppl (TRUE METRIX AIR GLUCOSE METER) DEVI 1 Device by Does not apply route 3 (three) times daily as needed. Check glucose up to three times per day as needed 1 Device 0  . carvedilol (COREG) 25 MG tablet TAKE 1 TABLET BY MOUTH  TWICE DAILY WITH MEALS (Patient taking differently: Take 25 mg by mouth 2 (two) times daily with a meal. ) 180 tablet 3  . cefadroxil (DURICEF) 500 MG capsule Take 500 mg by mouth 2 (two) times daily.    . cholecalciferol (VITAMIN D) 1000 UNITS tablet Take 1,000 Units by mouth daily.    . fluticasone (FLONASE) 50 MCG/ACT nasal spray USE 2 SPRAYS IN BOTH&amp;amp;nbsp;&amp;amp;nbsp;NOSTRILS DAILY 48 g 1  . gabapentin (NEURONTIN) 100 MG capsule TAKE 1 CAPSULE BY MOUTH  TWICE DAILY 180 capsule 3  . glucose blood (TRUE METRIX BLOOD GLUCOSE TEST) test strip The patient is insulin requiring, ICD 10 code E11.9. The patient tests 3 times per day. 100 each 12  . hydrALAZINE (APRESOLINE) 100 MG tablet Take 100 mg by mouth 2 (two) times daily.    . hydrALAZINE (APRESOLINE) 25 MG tablet Take 3 tablets (75 mg total) by mouth 2 (two) times  daily. 90 tablet 1  . hydrocortisone (PROCTOZONE-HC) 2.5 % rectal cream Place 1 application rectally 2 (two) times daily. 30 g 0  . loratadine (CLARITIN) 10 MG tablet Take 1 tablet (10 mg total) by mouth daily. 90 tablet 3  . LORazepam (ATIVAN) 1 MG tablet Take 1 mg by mouth daily as needed for anxiety.    . metFORMIN (GLUCOPHAGE-XR) 500 MG 24 hr tablet TAKE 2 TABLETS BY MOUTH  DAILY WITH BREAKFAST (Patient taking differently: Take 1,000 mg by mouth daily with breakfast. ) 180 tablet 3  . Multiple Vitamin (MULTIVITAMIN) tablet Take 1 tablet by mouth daily.     Marland Kitchen olmesartan (BENICAR) 40 MG tablet TAKE 1 TABLET BY MOUTH IN  THE EVENING 90 tablet 3  . simvastatin (ZOCOR) 20 MG tablet TAKE 1 TABLET BY MOUTH AT   BEDTIME (Patient taking differently: Take 20 mg by mouth at bedtime. ) 90 tablet 3  . traMADol (ULTRAM) 50 MG tablet Take 1 tablet (50 mg total) by mouth daily. 90 tablet 0  . TRUEplus Lancets 33G MISC The patient is insulin requiring, ICD 10 code E11.9. The patient tests 3 times per day. 100 each 12   No current facility-administered medications on file prior to visit.    Family History  Problem Relation Age of Onset  . Heart disease Mother 56       CAD  . Breast cancer Mother   . Cancer Mother 71       Breast cancer, mother  . Cancer Father 81       colon cancer dx'ed age 13  . Diabetes Father   . Coronary artery disease Other   . Multiple sclerosis Daughter   . Diabetes Brother   . Diabetes Brother     Social History   Socioeconomic History  . Marital status: Single    Spouse name: Not on file  . Number of children: Not on file  . Years of education: Not on file  . Highest education level: Not on file  Occupational History  . Not on file  Tobacco Use  . Smoking status: Never Smoker  . Smokeless tobacco: Never Used  Vaping Use  . Vaping Use: Never used  Substance and Sexual Activity  . Alcohol use: No    Alcohol/week: 0.0 standard drinks  . Drug use: No  . Sexual activity: Not on file    Comment: Hysterectomy  Other Topics Concern  . Not on file  Social History Narrative   Single, 2 kids   Never smoked   No alcohol use   No drugs   Laid off from [employer] after working there for 14 years.    Social Determinants of Health   Financial Resource Strain: Not on file  Food Insecurity: Not on file  Transportation Needs: Not on file  Physical Activity: Not on file  Stress: Not on file  Social Connections: Not on file  Intimate Partner Violence: Not on file    Review of Systems: ROS negative except for what is noted on the assessment and plan.  Vitals:   03/26/20 1430  BP: (!) 156/88  Pulse: 96  Temp: 98.5 F (36.9 C)  TempSrc: Oral   SpO2: 100%  Weight: 234 lb 4.8 oz (106.3 kg)  Height: $Remove'5\' 8"'wlRzCMW$  (1.727 m)     Physical Exam: Physical Exam Exam conducted with a chaperone present.  Constitutional:      Appearance: Normal appearance.  HENT:     Head: Normocephalic and  atraumatic.  Eyes:     Extraocular Movements: Extraocular movements intact.  Cardiovascular:     Rate and Rhythm: Normal rate.     Pulses: Normal pulses.     Heart sounds: Normal heart sounds.  Pulmonary:     Effort: Pulmonary effort is normal.     Breath sounds: Normal breath sounds.  Abdominal:     General: Bowel sounds are normal. There is no distension.     Palpations: Abdomen is soft.     Tenderness: There is no abdominal tenderness.     Hernia: There is no hernia in the left inguinal area or right inguinal area.  Musculoskeletal:        General: Normal range of motion.     Cervical back: Normal range of motion.     Right hip: Bony tenderness (TTP of the right edge of sacrum ) present. No crepitus. Normal range of motion. Normal strength.     Right knee: Effusion present. No erythema or ecchymosis. Normal range of motion (pain with passive extension to antomomic barrier). Tenderness (TTP over the patela ) present.     Right lower leg: No edema.     Left lower leg: No edema.  Skin:    General: Skin is warm and dry.  Neurological:     Mental Status: She is alert and oriented to person, place, and time. Mental status is at baseline.  Psychiatric:        Mood and Affect: Mood normal.      Assessment & Plan:   See Encounters Tab for problem based charting.  Patient discussed with Dr. Kelle Darting, D.O. [facility], PGY-2 Pager: [phone], Phone: [phone] Date 03/27/2020 Time 6:46 AM

## 2020-03-27 ENCOUNTER — Encounter: Payer: Self-pay | Admitting: Internal Medicine

## 2020-03-27 DIAGNOSIS — M533 Sacrococcygeal disorders, not elsewhere classified: Secondary | ICD-10-CM | POA: Insufficient documentation

## 2020-03-27 DIAGNOSIS — S8990XA Unspecified injury of unspecified lower leg, initial encounter: Secondary | ICD-10-CM | POA: Insufficient documentation

## 2020-03-27 NOTE — Assessment & Plan Note (Addendum)
Patient presents today after injuring her right knee.  She states that she hit it on the side table.  She states that it is sore but does not affect her functionality.  She states that she walks with a cane regularly and does not feel as though her injury increases her risk of falling.  Furthermore she denies any recent falls.  On evaluation of the knee, there is no erythema, but there is evidence of knee effusion.  Patient states that she has a history of osteoarthritis with previous arthrocentesis of this right knee due to knee effusions in the past.  Patient has full active and passive range of motion with pain reproduced during flexion at her anatomic barrier to passive range of motion.  I discussed the possibility of traumatic effusion versus recurrent osteoarthritis effusion.  I counseled her to try activity modification and topical NSAID for pain relief.  If pain and effusion persist, then we will likely need to perform therapeutic arthrocentesis with possible steroid injection in a few weeks.  Patient was in agreement with this plan.    Plan: -Conservative management with rest/activity modification and topical NSAIDs. -Follow-up in a few weeks for possible arthrocentesis.

## 2020-03-27 NOTE — Assessment & Plan Note (Signed)
Patient has a history of gastroesophageal reflux.  She states that she has been on gastric acid suppressing medication in the past.  Specifically she states that she was taking omeprazole but has not been taking it for some time now.  He states that her symptoms are intermittent and do not occur daily.  I counseled her regarding avoidance of medications and foods that could exacerbate her gastroesophageal reflux.  Furthermore, I offered her a prescription for famotidine to take as needed for her reflux.  If her reflux continues she may need another course of PPI.  Plan: -Famotidine 20 mg as needed for gastroesophageal reflux -Counseled on dietary changes.

## 2020-03-28 ENCOUNTER — Other Ambulatory Visit: Payer: Self-pay

## 2020-03-28 ENCOUNTER — Other Ambulatory Visit: Payer: Self-pay | Admitting: Internal Medicine

## 2020-03-28 NOTE — Telephone Encounter (Signed)
I cannot see any record of our clinic prescribing this medication either. She will need to be reevaluated prior to starting this medication.

## 2020-03-28 NOTE — Telephone Encounter (Signed)
Pt is requesting her  LORazepam (ATIVAN) 1 MG tablet sent to  [facility], [address] [place], [address] Phone:  [phone]  Fax:  [phone]

## 2020-03-31 NOTE — Progress Notes (Signed)
Patient Care Team: Marty Heck, DO as PCP - General (Internal Medicine) Bensimhon, Shaune Pascal, MD as Referring Physician (Cardiology) Rutherford Guys, MD as Consulting Physician (Ophthalmology)  DIAGNOSIS:    ICD-10-CM   1. Malignant neoplasm of upper-outer quadrant of left breast in female, estrogen receptor positive ([place])  C50.412    Z17.0     SUMMARY OF ONCOLOGIC HISTORY: Oncology History  Breast cancer of upper-outer quadrant of left female breast ([place]) (Resolved)  01/15/2010 Surgery    bilateral mastectomies: left breast IDC , ER/PR positive HER-2 positive  stage II a;  right breast benign   04/17/2010 - 04/17/2011 Chemotherapy   weekly Taxotere carboplatinum and Herceptin x 6 cycles  followed by Herceptin maintenance   09/01/2010 - 09/02/2015 Anti-estrogen oral therapy    letrozole 2.5 mg daily (BCI revealed 10%, high risk of recurrence but low probability of benefit from extended adjuvant therapy)     CHIEF COMPLIANT: Surveillance of breast cancer  INTERVAL HISTORY: [name] is a 70 y.o. with above-mentioned history of left breast cancer treated with bilateral mastectomies, adjuvant chemotherapy, and antiestrogen therapy with letrozole completed in 2017. She presents to the clinic today for follow-up.  She denies any lumps or nodules in the chest wall or axilla.  She has continued to gain weight because of her inactivity.  ALLERGIES:  is allergic to atorvastatin, hctz [hydrochlorothiazide], norvasc [amlodipine besylate], and rosuvastatin.  MEDICATIONS:  Current Outpatient Medications  Medication Sig Dispense Refill  . albuterol (PROAIR HFA) 108 (90 Base) MCG/ACT inhaler Inhale 1-2 puffs into the lungs every 6 (six) hours as needed for wheezing or shortness of breath. 6.7 g 11  . amLODipine (NORVASC) 5 MG tablet TAKE 1 TABLET BY MOUTH  DAILY 90 tablet 3  . azelastine (ASTELIN) 0.1 % nasal spray PLACE 2 SPRAYS INTO BOTH NOSTRILS AT BEDTIME AS NEEDED FOR RHINITIS.  30 mL 1  . beclomethasone (QVAR) 80 MCG/ACT inhaler Inhale 2 puffs into the lungs 2 (two) times daily. 1 Inhaler 0  . Blood Glucose Calibration (TRUE METRIX LEVEL 1) Low SOLN Please use as directed 3 each 0  . Blood Glucose Monitoring Suppl (TRUE METRIX AIR GLUCOSE METER) DEVI 1 Device by Does not apply route 3 (three) times daily as needed. Check glucose up to three times per day as needed 1 Device 0  . carvedilol (COREG) 25 MG tablet TAKE 1 TABLET BY MOUTH  TWICE DAILY WITH MEALS (Patient taking differently: Take 25 mg by mouth 2 (two) times daily with a meal. ) 180 tablet 3  . cefadroxil (DURICEF) 500 MG capsule Take 500 mg by mouth 2 (two) times daily.    . cholecalciferol (VITAMIN D) 1000 UNITS tablet Take 1,000 Units by mouth daily.    . diclofenac Sodium (VOLTAREN) 1 % GEL APPLY 4 GRAMS TOPICALLY 4  TIMES DAILY AS NEEDED 100 g 0  . famotidine (PEPCID) 20 MG tablet Take 1 tablet (20 mg total) by mouth daily. 30 tablet 0  . fluticasone (FLONASE) 50 MCG/ACT nasal spray USE 2 SPRAYS IN BOTH&amp;nbsp;&amp;nbsp;NOSTRILS DAILY 48 g 1  . gabapentin (NEURONTIN) 100 MG capsule TAKE 1 CAPSULE BY MOUTH  TWICE DAILY 180 capsule 3  . glucose blood (TRUE METRIX BLOOD GLUCOSE TEST) test strip The patient is insulin requiring, ICD 10 code E11.9. The patient tests 3 times per day. 100 each 12  . hydrALAZINE (APRESOLINE) 100 MG tablet Take 100 mg by mouth 2 (two) times daily.    . hydrALAZINE (APRESOLINE) 25  MG tablet Take 3 tablets (75 mg total) by mouth 2 (two) times daily. 90 tablet 1  . hydrocortisone (PROCTOZONE-HC) 2.5 % rectal cream Place 1 application rectally 2 (two) times daily. 30 g 0  . loratadine (CLARITIN) 10 MG tablet Take 1 tablet (10 mg total) by mouth daily. 90 tablet 3  . LORazepam (ATIVAN) 1 MG tablet Take 1 mg by mouth daily as needed for anxiety.    . metFORMIN (GLUCOPHAGE-XR) 500 MG 24 hr tablet TAKE 2 TABLETS BY MOUTH  DAILY WITH BREAKFAST (Patient taking differently: Take 1,000 mg by  mouth daily with breakfast. ) 180 tablet 3  . Multiple Vitamin (MULTIVITAMIN) tablet Take 1 tablet by mouth daily.     Marland Kitchen olmesartan (BENICAR) 40 MG tablet TAKE 1 TABLET BY MOUTH IN  THE EVENING 90 tablet 3  . simvastatin (ZOCOR) 20 MG tablet TAKE 1 TABLET BY MOUTH AT  BEDTIME (Patient taking differently: Take 20 mg by mouth at bedtime. ) 90 tablet 3  . traMADol (ULTRAM) 50 MG tablet Take 1 tablet (50 mg total) by mouth daily. 90 tablet 0  . TRUEplus Lancets 33G MISC The patient is insulin requiring, ICD 10 code E11.9. The patient tests 3 times per day. 100 each 12   No current facility-administered medications for this visit.    PHYSICAL EXAMINATION: ECOG PERFORMANCE STATUS: 1 - Symptomatic but completely ambulatory    LABORATORY DATA:  I have reviewed the data as listed CMP Latest Ref Rng & Units 12/18/2019 11/29/2019 07/31/2019  Glucose 70 - 99 mg/dL 105(H) 75 -  BUN 8 - 23 mg/dL 26(H) 19 -  Creatinine 0.44 - 1.00 mg/dL 1.60(H) 1.31(H) -  Sodium 135 - 145 mmol/L 140 141 -  Potassium 3.5 - 5.1 mmol/L 4.3 4.3 -  Chloride 98 - 111 mmol/L 107 103 -  CO2 22 - 32 mmol/L 25 23 -  Calcium 8.9 - 10.3 mg/dL 9.6 9.9 -  Total Protein 6.0 - 8.5 g/dL - - 7.1  Total Bilirubin 0.0 - 1.2 mg/dL - - -  Alkaline Phos 39 - 117 IU/L - - -  AST 0 - 40 IU/L - - -  ALT 0 - 32 IU/L - - -    Lab Results  Component Value Date   WBC 6.4 12/18/2019   HGB 11.4 (L) 12/18/2019   HCT 34.7 (L) 12/18/2019   MCV 85.9 12/18/2019   PLT 264 12/18/2019   NEUTROABS 2.4 08/03/2017    ASSESSMENT & PLAN:  Breast cancer of upper-outer quadrant of left female breast ([place]) Left breast invasive ductal carcinoma stage II a ER/PR positive HER-2 positive status post bilateral mastectomies (Right CVA, Rt IJ DVT) Followed by 6 cycles of TCH followed by Herceptin maintenance completed 04/17/2011  Letrozole June 2012-June 2017 BCI: High risk: 10% risk of late distant recurrence, no probability of benefit from extended adjuvant  therapy.  Breast Cancer Surveillance: 1. Breast exam1/12/2020: Benign 2. No role of imaging studies since she had bilateral mastectomies 3. Bone density 05/24/2019: normal T score 0.1  4.  Bone scan 04/21/2019: Questionable metastatic lesions at T11 and left frontal calvarium 5. MRI thoracic spine: No evidence of metastatic disease. Therefore no additional work-up was performed.  Survivorship: Encouraged her to do 30 minutes of walking every day.  She does me that she has been lazy and has not been doing any activity.  Return to clinic in 1 year for follow-up    No orders of the defined types were placed in  this encounter.  The patient has a good understanding of the overall plan. she agrees with it. she will call with any problems that may develop before the next visit here.  Total time spent: 20 mins including face to face time and time spent for planning, charting and coordination of care  Nicholas Lose, MD 04/01/2020  I, Cloyde Reams Dorshimer, am acting as scribe for Dr. Nicholas Lose.  I have reviewed the above documentation for accuracy and completeness, and I agree with the above.

## 2020-04-01 ENCOUNTER — Ambulatory Visit: Payer: Medicare Other | Admitting: Podiatry

## 2020-04-01 ENCOUNTER — Inpatient Hospital Stay: Payer: Medicare Other | Attending: Hematology and Oncology | Admitting: Hematology and Oncology

## 2020-04-01 ENCOUNTER — Other Ambulatory Visit: Payer: Self-pay

## 2020-04-01 DIAGNOSIS — Z86718 Personal history of other venous thrombosis and embolism: Secondary | ICD-10-CM | POA: Diagnosis not present

## 2020-04-01 DIAGNOSIS — Z9221 Personal history of antineoplastic chemotherapy: Secondary | ICD-10-CM | POA: Insufficient documentation

## 2020-04-01 DIAGNOSIS — C50412 Malignant neoplasm of upper-outer quadrant of left female breast: Secondary | ICD-10-CM | POA: Diagnosis not present

## 2020-04-01 DIAGNOSIS — Z79811 Long term (current) use of aromatase inhibitors: Secondary | ICD-10-CM | POA: Diagnosis not present

## 2020-04-01 DIAGNOSIS — Z79899 Other long term (current) drug therapy: Secondary | ICD-10-CM | POA: Diagnosis not present

## 2020-04-01 DIAGNOSIS — Z9013 Acquired absence of bilateral breasts and nipples: Secondary | ICD-10-CM | POA: Diagnosis not present

## 2020-04-01 DIAGNOSIS — Z7951 Long term (current) use of inhaled steroids: Secondary | ICD-10-CM | POA: Insufficient documentation

## 2020-04-01 DIAGNOSIS — Z8673 Personal history of transient ischemic attack (TIA), and cerebral infarction without residual deficits: Secondary | ICD-10-CM | POA: Insufficient documentation

## 2020-04-01 DIAGNOSIS — Z170 Estrogen receptor positive status [ER+]: Secondary | ICD-10-CM | POA: Diagnosis not present

## 2020-04-01 NOTE — Assessment & Plan Note (Signed)
Left breast invasive ductal carcinoma stage II a ER/PR positive HER-2 positive status post bilateral mastectomies (Right CVA, Rt IJ DVT) Followed by 6 cycles of [place] followed by Herceptin maintenance completed 04/17/2011  Letrozole June 2012-June 2017 BCI: High risk: 10% risk of late distant recurrence, no probability of benefit from extended adjuvant therapy.  Breast Cancer Surveillance: 1. Breast exam1/12/2020: Benign 2. No role of imaging studies since she had bilateral mastectomies 3. Bone density 05/24/2019: normal T score 0.1  4.  Bone scan 04/21/2019: Questionable metastatic lesions at T11 and left frontal calvarium  Recommendation: Restaging scans with CT and bone scans. Return to clinic after that to discuss results.

## 2020-04-02 NOTE — Progress Notes (Addendum)
Internal Medicine Clinic Attending  Case discussed with Dr. Coe  At the time of the visit.  We reviewed the resident's history and exam and pertinent patient test results.  I agree with the assessment, diagnosis, and plan of care documented in the resident's note.  

## 2020-04-04 ENCOUNTER — Ambulatory Visit (INDEPENDENT_AMBULATORY_CARE_PROVIDER_SITE_OTHER): Payer: Medicare Other | Admitting: Internal Medicine

## 2020-04-04 ENCOUNTER — Other Ambulatory Visit: Payer: Self-pay

## 2020-04-04 ENCOUNTER — Encounter: Payer: Self-pay | Admitting: Internal Medicine

## 2020-04-04 VITALS — BP 146/76 | HR 89 | Temp 98.4°F | Ht 68.0 in | Wt 233.5 lb

## 2020-04-04 DIAGNOSIS — F410 Panic disorder [episodic paroxysmal anxiety] without agoraphobia: Secondary | ICD-10-CM | POA: Diagnosis not present

## 2020-04-04 DIAGNOSIS — M1711 Unilateral primary osteoarthritis, right knee: Secondary | ICD-10-CM | POA: Diagnosis not present

## 2020-04-04 MED ORDER — LORAZEPAM 1 MG PO TABS: 1.0000 mg | ORAL_TABLET | Freq: Every day | ORAL | 0 refills | Status: DC | PRN

## 2020-04-04 NOTE — Patient Instructions (Addendum)
Thank you, Ms.Edythe Clarity for allowing [place] to provide your care today. Today we discussed Anxiety and Knee pain.    I have ordered the following labs for you:  Lab Orders  No laboratory test(s) ordered today     Tests ordered today:  Right Knee Arthrocentesis   Referrals ordered today:   Referral Orders  No referral(s) requested today     I have ordered the following medication/changed the following medications:   Stop the following medications: There are no discontinued medications.   Start the following medications: Lorazepam 1 mg for anxiety  Sertraline 25 mg daily  Follow up: with primary care physician.  Should you have any questions or concerns please call the internal medicine clinic at [phone].     Marianna Payment, D.O. [place]

## 2020-04-04 NOTE — Progress Notes (Signed)
CC: Panic Attacks  HPI:  [name] is a 70 y.o. female with a past medical history stated below and presents today for Panic attacks. Please see problem based assessment and plan for additional details.  Past Medical History:  Diagnosis Date  . Anemia   . Anxiety   . Arthritis    DJD, lumbar spondylosis  . Asthma   . Back pain   . Bilateral renal cysts   . Breast cancer ([place])    left breast invasive ductal ca in remission as of 10/07/11 s/p double mastectomy  . Breast cancer of upper-outer quadrant of left female breast ([place]) 01/15/2010   Per Dr. Laurelyn Sickle last clinic note 04/2012:  stage II a invasive ductal carcinoma of the left breast diagnosed in October 2011. She was found to have a ER/PR positive HER-2/neu positive breast cancer.She underwent bilateral mastectomies. Her right mastectomy was an elective prophylactic mastectomy due to her on comfort. Patient subsequently underwent adjuvant chemotherapy consisting of Taxotere carbop  . Bronchitis 03/10/2012  . Cataract   . Chemotherapy follow-up examination 03/06/2011  . Colon polyps   . Cough 03/06/2011  . Depression    seasonal melancholia  . E. coli UTI   . Edema extremities   . GERD (gastroesophageal reflux disease)   . History of migraine headaches   . Hyperlipidemia   . Hypertension   . Ileitis   . Kidney stones    non obstructive   . Myopathy    not statin related, cause unknown     Current Outpatient Medications on File Prior to Visit  Medication Sig Dispense Refill  . albuterol (PROAIR HFA) 108 (90 Base) MCG/ACT inhaler Inhale 1-2 puffs into the lungs every 6 (six) hours as needed for wheezing or shortness of breath. 6.7 g 11  . amLODipine (NORVASC) 5 MG tablet TAKE 1 TABLET BY MOUTH  DAILY 90 tablet 3  . Blood Glucose Calibration (TRUE METRIX LEVEL 1) Low SOLN Please use as directed 3 each 0  . Blood Glucose Monitoring Suppl (TRUE METRIX AIR GLUCOSE METER) DEVI 1 Device by Does not apply route 3  (three) times daily as needed. Check glucose up to three times per day as needed 1 Device 0  . carvedilol (COREG) 25 MG tablet TAKE 1 TABLET BY MOUTH  TWICE DAILY WITH MEALS (Patient taking differently: Take 25 mg by mouth 2 (two) times daily with a meal. ) 180 tablet 3  . cefadroxil (DURICEF) 500 MG capsule Take 500 mg by mouth 2 (two) times daily.    . cholecalciferol (VITAMIN D) 1000 UNITS tablet Take 1,000 Units by mouth daily.    . diclofenac Sodium (VOLTAREN) 1 % GEL APPLY 4 GRAMS TOPICALLY 4&nbsp;&nbsp;TIMES DAILY AS NEEDED 100 g 0  . famotidine (PEPCID) 20 MG tablet Take 1 tablet (20 mg total) by mouth daily. 30 tablet 0  . fluticasone (FLONASE) 50 MCG/ACT nasal spray USE 2 SPRAYS IN BOTH&amp;amp;nbsp;&amp;amp;nbsp;NOSTRILS DAILY 48 g 1  . glucose blood (TRUE METRIX BLOOD GLUCOSE TEST) test strip The patient is insulin requiring, ICD 10 code E11.9. The patient tests 3 times per day. 100 each 12  . hydrALAZINE (APRESOLINE) 100 MG tablet Take 100 mg by mouth 2 (two) times daily.    . hydrALAZINE (APRESOLINE) 25 MG tablet Take 3 tablets (75 mg total) by mouth 2 (two) times daily. 90 tablet 1  . hydrocortisone (PROCTOZONE-HC) 2.5 % rectal cream Place 1 application rectally 2 (two) times daily. 30 g 0  . loratadine (  CLARITIN) 10 MG tablet Take 1 tablet (10 mg total) by mouth daily. 90 tablet 3  . metFORMIN (GLUCOPHAGE-XR) 500 MG 24 hr tablet TAKE 2 TABLETS BY MOUTH  DAILY WITH BREAKFAST (Patient taking differently: Take 1,000 mg by mouth daily with breakfast. ) 180 tablet 3  . Multiple Vitamin (MULTIVITAMIN) tablet Take 1 tablet by mouth daily.     Marland Kitchen olmesartan (BENICAR) 40 MG tablet TAKE 1 TABLET BY MOUTH IN  THE EVENING 90 tablet 3  . simvastatin (ZOCOR) 20 MG tablet TAKE 1 TABLET BY MOUTH AT  BEDTIME (Patient taking differently: Take 20 mg by mouth at bedtime. ) 90 tablet 3  . traMADol (ULTRAM) 50 MG tablet Take 1 tablet (50 mg total) by mouth daily. 90 tablet 0  . TRUEplus Lancets 33G MISC  The patient is insulin requiring, ICD 10 code E11.9. The patient tests 3 times per day. 100 each 12   No current facility-administered medications on file prior to visit.    Family History  Problem Relation Age of Onset  . Heart disease Mother 59       CAD  . Breast cancer Mother   . Cancer Mother 93       Breast cancer, mother  . Cancer Father 36       colon cancer dx'ed age 63  . Diabetes Father   . Coronary artery disease Other   . Multiple sclerosis Daughter   . Diabetes Brother   . Diabetes Brother     Social History   Socioeconomic History  . Marital status: Single    Spouse name: Not on file  . Number of children: Not on file  . Years of education: Not on file  . Highest education level: Not on file  Occupational History  . Not on file  Tobacco Use  . Smoking status: Never Smoker  . Smokeless tobacco: Never Used  Vaping Use  . Vaping Use: Never used  Substance and Sexual Activity  . Alcohol use: No    Alcohol/week: 0.0 standard drinks  . Drug use: No  . Sexual activity: Not on file    Comment: Hysterectomy  Other Topics Concern  . Not on file  Social History Narrative   Single, 2 kids   Never smoked   No alcohol use   No drugs   Laid off from [employer] after working there for 14 years.    Social Determinants of Health   Financial Resource Strain: Not on file  Food Insecurity: Not on file  Transportation Needs: Not on file  Physical Activity: Not on file  Stress: Not on file  Social Connections: Not on file  Intimate Partner Violence: Not on file    Review of Systems: ROS negative except for what is noted on the assessment and plan.  Vitals:   04/04/20 1500 04/04/20 1506  BP: (!) 152/80 (!) 146/76  Pulse: 89   Temp: 98.4 F (36.9 C)   TempSrc: Oral   SpO2: 99%   Weight: 233 lb 8 oz (105.9 kg)   Height: 5' 8" (1.727 m)      Physical Exam: Physical Exam Constitutional:      Appearance: Normal appearance.  HENT:      Head: Normocephalic and atraumatic.  Eyes:     Extraocular Movements: Extraocular movements intact.  Cardiovascular:     Rate and Rhythm: Normal rate.     Pulses: Normal pulses.     Heart sounds: Normal heart sounds.  Pulmonary:  Effort: Pulmonary effort is normal.     Breath sounds: Normal breath sounds.  Abdominal:     General: Bowel sounds are normal.     Palpations: Abdomen is soft.     Tenderness: There is no abdominal tenderness.  Musculoskeletal:        General: Swelling (right knee with effusion and tenderness to palpation ) present. Normal range of motion.     Cervical back: Normal range of motion.  Skin:    General: Skin is warm and dry.  Neurological:     Mental Status: She is alert and oriented to person, place, and time. Mental status is at baseline.  Psychiatric:        Mood and Affect: Mood normal.      Assessment & Plan:   See Encounters Tab for problem based charting.  Patient discussed with Dr. Kelle Darting, D.O. [facility], PGY-2 Pager: [phone], Phone: [phone] Date 04/07/2020 Time 7:46 AM

## 2020-04-05 ENCOUNTER — Ambulatory Visit: Payer: Medicare Other | Admitting: Podiatry

## 2020-04-07 ENCOUNTER — Encounter: Payer: Self-pay | Admitting: Internal Medicine

## 2020-04-07 DIAGNOSIS — F410 Panic disorder [episodic paroxysmal anxiety] without agoraphobia: Secondary | ICD-10-CM | POA: Insufficient documentation

## 2020-04-07 NOTE — Assessment & Plan Note (Signed)
Patient presents for follow-up appointment for medication management for panic attacks.  Patient states that she was prescribed Ativan 1 mg for panic attacks and ED visit several years ago.  She states that she seldomly gets panic attacks and uses roughly 1 tablet every several weeks.  She states that her anxiety has gotten worse over the last week or 2 secondary to loss of a personal family friend.  Patient denies ongoing general anxiety symptoms on a daily basis.  She states that she gets these symptoms episodically with anxiety, fear of impending doom, sweating and palpitations.  She states that she will take one of the Ativan and her symptoms improved significantly.  I tried to look the patient's prescription history up in PDMP, but there is no record of Ativan.  I discussed this with the patient and expressed my concern that the medical database does not provide proof of her past medication history.  Although I am not convinced that the patient is diverting medication, I would like to verify her previous medication history.  I will reach out to the pharmacy to get a better understanding of her prescription history.  In the meantime, I will give her a short course of Ativan.  Furthermore, I discussed starting an SSRI medication to help prevent future anxiety attacks.  Patient states that she does not want to take medication daily.  Ultimately I would like to transition this patient off of Ativan onto a more stable medication like sertraline or BuSpar.  I will have the patient follow-up with her primary care physician to discuss these medications further in the near future.  Plan: -Prescribe Ativan 1 mg, 10 pills as needed for panic attack -Consider switching to sertraline or BuSpar in the near future - Follow up with PCP

## 2020-04-07 NOTE — Assessment & Plan Note (Signed)
Patient presents for reevaluation of her right knee pain.  Patient has has longstanding osteoarthritis of the right knee with previous arthrocentesis and steroid injections.  Patient continues to have a small right knee effusion with tenderness to palpation and movement.  Discussed the risk and benefits of arthrocentesis and steroid injection.  Patient agreed to have the procedure today.  Knee Arthrocentesis with Injection Procedure Note  Diagnosis: right osteoarthritis with knee effusion   Indications: Symptom relief from osteoarthritis  Anesthesia: Lidocaine 2% without epinephrine  Procedure Details   Point of care ultrasound was used to identify the joint effusion and plan needle trajectory and depth. Consent was obtained for the procedure. The joint was prepped with Betadine. A 22 gauge needle was inserted into the Inferior aspect of the joint from a medial approach to access the suprapatellar pouch. 0 ml of  fluid was removed from the joint. 2 ml 1% lidocaine and 1 ml of Triamcinolone was then injected into the joint through the same needle. The needle was removed and the area cleansed and dressed.  Complications:  None; patient tolerated the procedure well.

## 2020-04-09 ENCOUNTER — Other Ambulatory Visit: Payer: Self-pay | Admitting: Internal Medicine

## 2020-04-09 DIAGNOSIS — K219 Gastro-esophageal reflux disease without esophagitis: Secondary | ICD-10-CM

## 2020-04-10 NOTE — Progress Notes (Signed)
Internal Medicine Clinic Attending  I saw and evaluated the patient.  I personally confirmed the key portions of the history and exam documented by Dr. Marianna Payment and I reviewed pertinent patient test results.  The assessment, diagnosis, and plan were formulated together and I agree with the documentation in the resident's note with the following correction.  I supervised the resident during the procedure. Right knee arthrocentesis was attempted from the inferior lateral (not medial) approach but no fluid was aspirated. The joint was then injected with 2 cc of 1% lidocaine and 40 mg kenalog. The knee space was entered on the first attempt and the patient tolerated the procedure without complication.

## 2020-04-17 ENCOUNTER — Encounter: Payer: Medicare Other | Admitting: Internal Medicine

## 2020-04-29 ENCOUNTER — Telehealth: Payer: Self-pay | Admitting: Pulmonary Disease

## 2020-04-29 DIAGNOSIS — C50912 Malignant neoplasm of unspecified site of left female breast: Secondary | ICD-10-CM | POA: Diagnosis not present

## 2020-04-29 NOTE — Telephone Encounter (Signed)
Called and spoke with patient. She stated that she has been experiencing more SOB since last Thursday 04/25/20. She has a history of asthma and wonders if this is a flare up. She denied any wheezing or coughing. Also denied any fevers, body aches or chills. She is fully vaccinated. She is still using the albuterol HFA as needed for SOB. This helps some but does not last for long.   She was seen back in April 2021 and was advised to follow up in a year. She is ok with coming in for an appt if it is necessary.   Pharmacy is [facility] on [address], can you please advise? Thanks!

## 2020-04-29 NOTE — Telephone Encounter (Signed)
Called and spoke with patient to let her know of recs from [facility] R[facility]. She expressed understanding and was ok to schedule an appointment. She has been scheduled for tomorrow at 79 with Beth. Nothing further needed at this time.

## 2020-04-29 NOTE — Telephone Encounter (Signed)
She should be seen with a CXR.

## 2020-04-29 NOTE — Telephone Encounter (Signed)
Patient called into pulmonary to discuss her increased SOB. While on the phone, she mentioned that she had recently followed up with her surgeon last week and the surgeon had advised her that Dr. Lindi Adie had recommended a follow up CT scan and bone density scan based on the office notes. She was upset because she stated that this was not mentioned during her office visit and no one has called her to get this scheduled.   She wanted to see if our office could get the CT scan scheduled. I advised her that Dr. Lindi Adie works for a different department but I would send a message to his office. Patient verbalized understanding.   Routing to Dr. Geralyn Flash office for follow up.

## 2020-04-30 ENCOUNTER — Encounter: Payer: Self-pay | Admitting: *Deleted

## 2020-04-30 ENCOUNTER — Ambulatory Visit (INDEPENDENT_AMBULATORY_CARE_PROVIDER_SITE_OTHER): Payer: Medicare Other

## 2020-04-30 ENCOUNTER — Other Ambulatory Visit (INDEPENDENT_AMBULATORY_CARE_PROVIDER_SITE_OTHER): Payer: Medicare Other

## 2020-04-30 ENCOUNTER — Ambulatory Visit: Payer: Medicare Other | Admitting: Primary Care

## 2020-04-30 ENCOUNTER — Encounter: Payer: Self-pay | Admitting: Primary Care

## 2020-04-30 ENCOUNTER — Other Ambulatory Visit: Payer: Self-pay

## 2020-04-30 ENCOUNTER — Other Ambulatory Visit: Payer: Self-pay | Admitting: *Deleted

## 2020-04-30 VITALS — BP 118/68 | HR 87 | Temp 97.9°F | Ht 68.0 in | Wt 233.0 lb

## 2020-04-30 DIAGNOSIS — R0609 Other forms of dyspnea: Secondary | ICD-10-CM | POA: Insufficient documentation

## 2020-04-30 DIAGNOSIS — C50412 Malignant neoplasm of upper-outer quadrant of left female breast: Secondary | ICD-10-CM

## 2020-04-30 DIAGNOSIS — R0602 Shortness of breath: Secondary | ICD-10-CM

## 2020-04-30 DIAGNOSIS — J45991 Cough variant asthma: Secondary | ICD-10-CM

## 2020-04-30 DIAGNOSIS — R0600 Dyspnea, unspecified: Secondary | ICD-10-CM | POA: Insufficient documentation

## 2020-04-30 DIAGNOSIS — Z170 Estrogen receptor positive status [ER+]: Secondary | ICD-10-CM

## 2020-04-30 LAB — BASIC METABOLIC PANEL
BUN: 16 mg/dL (ref 6–23)
CO2: 30 mEq/L (ref 19–32)
Calcium: 9.5 mg/dL (ref 8.4–10.5)
Chloride: 106 mEq/L (ref 96–112)
Creatinine, Ser: 1.37 mg/dL — ABNORMAL HIGH (ref 0.40–1.20)
GFR: 38.89 mL/min — ABNORMAL LOW (ref >60.00)
Glucose, Bld: 104 mg/dL — ABNORMAL HIGH (ref 70–99)
Potassium: 4 mEq/L (ref 3.5–5.1)
Sodium: 143 mEq/L (ref 135–145)

## 2020-04-30 LAB — BRAIN NATRIURETIC PEPTIDE: Pro B Natriuretic peptide (BNP): 21 pg/mL (ref 0.0–100.0)

## 2020-04-30 MED ORDER — ALBUTEROL SULFATE HFA 108 (90 BASE) MCG/ACT IN AERS: 1.0000 | INHALATION_SPRAY | Freq: Four times a day (QID) | RESPIRATORY_TRACT | 11 refills | Status: DC | PRN

## 2020-04-30 NOTE — Progress Notes (Signed)
Please let patient know CXR was normal. Lungs were clear. No evidence of pneumonia or pleural effusion. Cardiac shadow was within normal limits

## 2020-04-30 NOTE — Patient Instructions (Signed)
Your symptoms are not consistent with an asthmatic flare. Depending on CXR and labs we may consider adding a diuretic for a short period of time to help with leg swelling and shortness of breath.   Recommendations: Continue to use albuterol 2 puffs every 6 hours for shortness of breath Take ativan as prescribed as needed for anxiety   Orders: CXR today Labs at [facility]  Rx: Refill Albuterol  Follow-up: If symptoms do not improve recommend scheduling a visit with Dr. Elsworth Soho     Shortness of Breath, Adult Shortness of breath means you have trouble breathing. Shortness of breath could be a sign of a medical problem. Follow these instructions at home:  Watch for any changes in your symptoms.  Do not use any products that contain nicotine or tobacco, such as cigarettes, e-cigarettes, and chewing tobacco.  Do not smoke. Smoking can cause shortness of breath. If you need help to quit smoking, ask your doctor.  Avoid things that can make it harder to breathe, such as: ? Mold. ? Dust. ? Air pollution. ? Chemical smells. ? Things that can cause allergy symptoms (allergens), if you have allergies.  Keep your living space clean. Use products that help remove mold and dust.  Rest as needed. Slowly return to your normal activities.  Take over-the-counter and prescription medicines only as told by your doctor. This includes oxygen therapy and inhaled medicines.  Keep all follow-up visits as told by your doctor. This is important.   Contact a doctor if:  Your condition does not get better as soon as expected.  You have a hard time doing your normal activities, even after you rest.  You have new symptoms. Get help right away if:  Your shortness of breath gets worse.  You have trouble breathing when you are resting.  You feel light-headed or you pass out (faint).  You have a cough that is not helped by medicines.  You cough up blood.  You have pain with breathing.  You have  pain in your chest, arms, shoulders, or belly (abdomen).  You have a fever.  You cannot walk up stairs.  You cannot exercise the way you normally do. These symptoms may represent a serious problem that is an emergency. Do not wait to see if the symptoms will go away. Get medical help right away. Call your local emergency services (911 in the [place] Do not drive yourself to the hospital. Summary  Shortness of breath is when you have trouble breathing enough air. It can be a sign of a medical problem.  Avoid things that make it hard for you to breathe, such as smoking, pollution, mold, and dust.  Watch for any changes in your symptoms. Contact your doctor if you do not get better or you get worse. This information is not intended to replace advice given to you by your health care provider. Make sure you discuss any questions you have with your health care provider. Document Revised: 08/09/2017 Document Reviewed: 08/09/2017 Elsevier Patient Education  2021 [employer].

## 2020-04-30 NOTE — Progress Notes (Signed)
Per MD request orders placed for NM whole body bone scan to be preformed within the next week.  Once apt is scheduled, f/u MD apt will be scheduled.

## 2020-04-30 NOTE — Progress Notes (Signed)
CXR normal. BNP normal. Creatinine slightly elevated, which is her baseline. If no improvement or if symptoms worsen would seek cardiology work up for dyspnea.

## 2020-04-30 NOTE — Progress Notes (Signed)
_0  ID: Edythe Clarity, female    DOB: 1950/08/14, 70 y.o.   MRN: [id number]  Chief Complaint  Patient presents with  . Acute Visit    3 weeks of worsening SOB    Referring provider: Marty Heck, DO  HPI: 70 year old female, never smoked. PMH significant for OSA, cough variant asthma, chronic diastolic heart failure, hypertension, type 2 DM, GERD. Patient of Dr. Elsworth Soho, last seen by pulmonary NP on 07/03/19.  04/30/2020  Patient presents today for acute visit. She reports increased shortness of breath x 3 weeks. This started after she was working out with 5 lbs weights and noticed some chest soreness. This also happens when she lifts heavy objects. Due to mastectomy she is fairly restricted with the activity that she can do. Nothing particularly brings on her shortness of breath. She has been using Albuterol  every 6 hours without improvement. She has occasional dry cough. Her weight has not changed recently. She has an upcoming CT chest but this has not been scheduled. She takes ativan as needed for anxiety, this does help for a very short time.   Denies f/c/s, chest pain, chest tightness of wheezing.    Observations/Objective: Methacholine challenge 11/2007 drop in smaller airways >>added singulair  Spirometry 11/2007 wnl &11/2010 -no obstruction.  Head CT 9/12 Ethmoid sinus mucosal thickening and bubbly opacity in the left sphenoid  PSG 06/2012 showed mild OSA - she felt very claustrophobic,she could not afford the CPAP - hence abandoned  Spirometry2/7/20 -shownormal lung function with FEV1 at 102%, ratio 79, FVC 101% mid flows normal.  Allergies  Allergen Reactions  . Atorvastatin Nausea Only    REACTION: Nausea, abdominal pain, and reflux. Pt has used the medication in the past and experienced the same reaction before the medication was discontinued.  Marland Kitchen Hctz [Hydrochlorothiazide]     Blurry vision  . Norvasc [Amlodipine Besylate]     Blurry vision  .  Other Other (See Comments)  . Rosuvastatin Itching    Immunization History  Administered Date(s) Administered  . Hepatitis A, Adult 09/27/2017  . Hepatitis B, adult 09/27/2017, 11/01/2017  . Hepb-cpg 03/31/2018  . Influenza Split 12/07/2017  . Influenza Whole 02/01/2006, 01/21/2007, 12/30/2007, 02/20/2009, 12/30/2009, 12/22/2010, 11/27/2011  . Influenza,inj,Quad PF,6+ Mos 12/07/2012, 11/29/2013, 12/17/2014, 11/11/2015, 01/04/2017, 12/01/2018  . Pneumococcal Conjugate-13 01/18/2014  . Pneumococcal Polysaccharide-23 02/21/2004, 03/23/2010, 05/29/2015  . Td 02/20/2002  . Tdap 06/14/2012  . Zoster Recombinat (Shingrix) 12/08/2018    Past Medical History:  Diagnosis Date  . Anemia   . Anxiety   . Arthritis    DJD, lumbar spondylosis  . Asthma   . Back pain   . Bilateral renal cysts   . Breast cancer ([place])    left breast invasive ductal ca in remission as of 10/07/11 s/p double mastectomy  . Breast cancer of upper-outer quadrant of left female breast ([place]) 01/15/2010   Per Dr. Laurelyn Sickle last clinic note 04/2012:  stage II a invasive ductal carcinoma of the left breast diagnosed in October 2011. She was found to have a ER/PR positive HER-2/neu positive breast cancer.She underwent bilateral mastectomies. Her right mastectomy was an elective prophylactic mastectomy due to her on comfort. Patient subsequently underwent adjuvant chemotherapy consisting of Taxotere carbop  . Bronchitis 03/10/2012  . Cataract   . Chemotherapy follow-up examination 03/06/2011  . Colon polyps   . Cough 03/06/2011  . Depression    seasonal melancholia  . E. coli UTI   . Edema extremities   .  GERD (gastroesophageal reflux disease)   . History of migraine headaches   . Hyperlipidemia   . Hypertension   . Ileitis   . Kidney stones    non obstructive   . Myopathy    not statin related, cause unknown     Tobacco History: Social History   Tobacco Use  Smoking Status Never Smoker  Smokeless Tobacco Never  Used   Counseling given: Not Answered   Outpatient Medications Prior to Visit  Medication Sig Dispense Refill  . amLODipine (NORVASC) 5 MG tablet TAKE 1 TABLET BY MOUTH  DAILY 90 tablet 3  . Blood Glucose Calibration (TRUE METRIX LEVEL 1) Low SOLN Please use as directed 3 each 0  . Blood Glucose Monitoring Suppl (TRUE METRIX AIR GLUCOSE METER) DEVI 1 Device by Does not apply route 3 (three) times daily as needed. Check glucose up to three times per day as needed 1 Device 0  . carvedilol (COREG) 25 MG tablet TAKE 1 TABLET BY MOUTH  TWICE DAILY WITH MEALS (Patient taking differently: Take 25 mg by mouth 2 (two) times daily with a meal.) 180 tablet 3  . cefadroxil (DURICEF) 500 MG capsule Take 500 mg by mouth 2 (two) times daily.    . cholecalciferol (VITAMIN D) 1000 UNITS tablet Take 1,000 Units by mouth daily.    . diclofenac Sodium (VOLTAREN) 1 % GEL APPLY 4 GRAMS TOPICALLY 4  TIMES DAILY AS NEEDED 100 g 0  . famotidine (PEPCID) 20 MG tablet TAKE 1 TABLET BY MOUTH EVERY DAY 90 tablet 1  . fluticasone (FLONASE) 50 MCG/ACT nasal spray USE 2 SPRAYS IN BOTH&amp;nbsp;&amp;nbsp;NOSTRILS DAILY 48 g 1  . glucose blood (TRUE METRIX BLOOD GLUCOSE TEST) test strip The patient is insulin requiring, ICD 10 code E11.9. The patient tests 3 times per day. 100 each 12  . hydrALAZINE (APRESOLINE) 100 MG tablet Take 100 mg by mouth 2 (two) times daily.    . hydrALAZINE (APRESOLINE) 25 MG tablet Take 3 tablets (75 mg total) by mouth 2 (two) times daily. 90 tablet 1  . hydrocortisone (PROCTOZONE-HC) 2.5 % rectal cream Place 1 application rectally 2 (two) times daily. 30 g 0  . LORazepam (ATIVAN) 1 MG tablet Take 1 tablet (1 mg total) by mouth daily as needed for anxiety (Panic Attacks). 10 tablet 0  . metFORMIN (GLUCOPHAGE-XR) 500 MG 24 hr tablet TAKE 2 TABLETS BY MOUTH  DAILY WITH BREAKFAST (Patient taking differently: Take 1,000 mg by mouth daily with breakfast.) 180 tablet 3  . Multiple Vitamin (MULTIVITAMIN)  tablet Take 1 tablet by mouth daily.    Marland Kitchen olmesartan (BENICAR) 40 MG tablet TAKE 1 TABLET BY MOUTH IN  THE EVENING 90 tablet 3  . simvastatin (ZOCOR) 20 MG tablet TAKE 1 TABLET BY MOUTH AT  BEDTIME (Patient taking differently: Take 20 mg by mouth at bedtime.) 90 tablet 3  . traMADol (ULTRAM) 50 MG tablet Take 1 tablet (50 mg total) by mouth daily. 90 tablet 0  . TRUEplus Lancets 33G MISC The patient is insulin requiring, ICD 10 code E11.9. The patient tests 3 times per day. 100 each 12  . albuterol (PROAIR HFA) 108 (90 Base) MCG/ACT inhaler Inhale 1-2 puffs into the lungs every 6 (six) hours as needed for wheezing or shortness of breath. 6.7 g 11  . loratadine (CLARITIN) 10 MG tablet Take 1 tablet (10 mg total) by mouth daily. 90 tablet 3   No facility-administered medications prior to visit.    Review of Systems  Review of Systems  Constitutional: Negative.   Respiratory: Positive for shortness of breath. Negative for chest tightness and wheezing.        Rare cough  Cardiovascular: Positive for leg swelling.   Physical Exam  BP 118/68 (BP Location: Right Arm, Cuff Size: Normal)   Pulse 87   Temp 97.9 F (36.6 C) (Oral)   Ht _0  (1.727 m)   Wt 233 lb (105.7 kg)   LMP 12/31/1968   SpO2 100%   BMI 35.43 kg/m  Physical Exam Constitutional:      Appearance: Normal appearance.  HENT:     Head: Normocephalic and atraumatic.     Mouth/Throat:     Mouth: Mucous membranes are moist.     Pharynx: Oropharynx is clear.  Cardiovascular:     Rate and Rhythm: Normal rate and regular rhythm.     Heart sounds: No murmur heard.     Comments: RRR; +2 BLE edema non-pitting Pulmonary:     Effort: Pulmonary effort is normal.     Breath sounds: Normal breath sounds.     Comments: Faint rhonchi left base; O2 100% RA Musculoskeletal:        General: Normal range of motion.  Skin:    General: Skin is warm and dry.  Neurological:     Mental Status: She is alert.  Psychiatric:         Mood and Affect: Mood normal.        Behavior: Behavior normal.        Thought Content: Thought content normal.        Judgment: Judgment normal.      Lab Results:  CBC    Component Value Date/Time   WBC 6.4 12/18/2019 1411   RBC 4.04 12/18/2019 1411   HGB 11.4 (L) 12/18/2019 1411   HGB 10.2 (L) 11/09/2018 1122   HGB 10.2 (L) 07/31/2014 1006   HCT 34.7 (L) 12/18/2019 1411   HCT 31.8 (L) 11/09/2018 1122   HCT 30.6 (L) 07/31/2014 1006   PLT 264 12/18/2019 1411   PLT 222 11/09/2018 1122   MCV 85.9 12/18/2019 1411   MCV 90 11/09/2018 1122   MCV 87.4 07/31/2014 1006   MCH 28.2 12/18/2019 1411   MCHC 32.9 12/18/2019 1411   RDW 13.0 12/18/2019 1411   RDW 13.4 11/09/2018 1122   RDW 13.3 07/31/2014 1006   LYMPHSABS 2.7 08/03/2017 1138   LYMPHSABS 2.2 07/31/2014 1006   MONOABS 0.4 08/03/2017 1138   MONOABS 0.4 07/31/2014 1006   EOSABS 0.4 08/03/2017 1138   EOSABS 0.4 07/31/2014 1006   BASOSABS 0.1 08/03/2017 1138   BASOSABS 0.1 07/31/2014 1006    BMET    Component Value Date/Time   NA 140 12/18/2019 1411   NA 141 11/29/2019 1447   NA 144 07/31/2014 1006   K 4.3 12/18/2019 1411   K 4.7 07/31/2014 1006   CL 107 12/18/2019 1411   CL 103 04/25/2012 1045   CO2 25 12/18/2019 1411   CO2 23 07/31/2014 1006   GLUCOSE 105 (H) 12/18/2019 1411   GLUCOSE 143 (H) 07/31/2014 1006   GLUCOSE 134 (H) 04/25/2012 1045   BUN 26 (H) 12/18/2019 1411   BUN 19 11/29/2019 1447   BUN 39.1 (H) 07/31/2014 1006   CREATININE 1.60 (H) 12/18/2019 1411   CREATININE 1.72 (H) 08/03/2017 1138   CREATININE 1.10 09/26/2014 1420   CREATININE 2.2 (H) 07/31/2014 1006   CALCIUM 9.6 12/18/2019 1411   CALCIUM 9.6 07/31/2014 1006  GFRNONAA 32 (L) 12/18/2019 1411   GFRNONAA 29 (L) 08/03/2017 1138   GFRNONAA 53 (L) 09/26/2014 1420   GFRAA 37 (L) 12/18/2019 1411   GFRAA 34 (L) 08/03/2017 1138   GFRAA 61 09/26/2014 1420    BNP    Component Value Date/Time   BNP 14.8 09/18/2010 0941    ProBNP     Component Value Date/Time   PROBNP 37.1 05/09/2011 2021    Imaging: DG Chest 2 View  Result Date: 04/30/2020 CLINICAL DATA:  Shortness of breath for 3 weeks EXAM: CHEST - 2 VIEW COMPARISON:  04/29/2018 FINDINGS: Cardiac shadow is within normal limits. The lungs are well aerated bilaterally. No focal infiltrate or sizable effusion is seen. No bony abnormality is noted. IMPRESSION: No acute abnormality seen. Electronically Signed   By: Inez Catalina M.D.   On: 04/30/2020 12:13     Assessment & Plan:   Dyspnea - Unclear etiology. Shortness of breath started 3 weeks ago, no other associated respiratory symptoms.  She had a normal Spirometery in 2020. Working out/ [employer] may have triggered this. She reports no improvement with SABA. PRN ativan helps for a short period. Recommend checking CXR and labs today (cbc, bmet, bnp). Low clinical suspicion for PE, HR and O2 are normal. She has no chest pain or hemoptysis.       Martyn Ehrich, NP 04/30/2020

## 2020-04-30 NOTE — Assessment & Plan Note (Addendum)
-   Unclear etiology. Shortness of breath started 3 weeks ago, no other associated respiratory symptoms.  She had a normal Spirometery in 2020. Working out/ [employer] may have triggered this. She reports no improvement with SABA. PRN ativan helps for a short period. Recommend checking CXR and labs today (cbc, bmet, bnp). Low clinical suspicion for PE, HR and O2 are normal. She has no chest pain or hemoptysis.

## 2020-05-01 ENCOUNTER — Telehealth: Payer: Self-pay | Admitting: Hematology and Oncology

## 2020-05-01 ENCOUNTER — Telehealth: Payer: Self-pay | Admitting: Internal Medicine

## 2020-05-01 NOTE — Telephone Encounter (Signed)
Scheduled appt per 2/9 sch msg - pt is aware of appt date and time   

## 2020-05-01 NOTE — Telephone Encounter (Signed)
Pt had moderna vaccine 1st dose on 05-05-2019, 2nd dose on 06-02-2019 and moderna booster on 11-06-2019

## 2020-05-10 ENCOUNTER — Encounter (HOSPITAL_COMMUNITY)
Admission: RE | Admit: 2020-05-10 | Discharge: 2020-05-10 | Disposition: A | Discharge status: Home / Self Care | Payer: Medicare Other | Source: Ambulatory Visit | Attending: Hematology and Oncology | Admitting: Hematology and Oncology

## 2020-05-10 ENCOUNTER — Other Ambulatory Visit: Payer: Self-pay

## 2020-05-10 DIAGNOSIS — Z170 Estrogen receptor positive status [ER+]: Secondary | ICD-10-CM | POA: Insufficient documentation

## 2020-05-10 DIAGNOSIS — C50919 Malignant neoplasm of unspecified site of unspecified female breast: Secondary | ICD-10-CM | POA: Diagnosis not present

## 2020-05-10 DIAGNOSIS — C50412 Malignant neoplasm of upper-outer quadrant of left female breast: Secondary | ICD-10-CM | POA: Diagnosis not present

## 2020-05-10 MED ORDER — TECHNETIUM TC 99M MEDRONATE IV KIT
20.8000 | PACK | Freq: Once | INTRAVENOUS | Status: AC
  Administered 2020-05-10: 20.8 via INTRAVENOUS

## 2020-05-22 ENCOUNTER — Other Ambulatory Visit: Payer: Self-pay

## 2020-05-22 DIAGNOSIS — J302 Other seasonal allergic rhinitis: Secondary | ICD-10-CM

## 2020-05-22 DIAGNOSIS — E785 Hyperlipidemia, unspecified: Secondary | ICD-10-CM

## 2020-05-22 NOTE — Telephone Encounter (Signed)
Pt is requesting her   simvastatin (ZOCOR) 20 MG tablet and fluticasone (FLONASE) 50 MCG/ACT nasal spray sent to  [facility], [address] [place], [address] Phone:  [phone]  Fax:  [phone]

## 2020-05-22 NOTE — Telephone Encounter (Signed)
Next appt scheduled 06/07/20 with PCP. 

## 2020-05-23 MED ORDER — FLUTICASONE PROPIONATE 50 MCG/ACT NA SUSP: NASAL | 1 refills | Status: DC

## 2020-05-23 MED ORDER — SIMVASTATIN 20 MG PO TABS: 20.0000 mg | ORAL_TABLET | Freq: Every day | ORAL | 3 refills | Status: DC

## 2020-05-26 NOTE — Assessment & Plan Note (Signed)
Left breast invasive ductal carcinoma stage II a ER/PR positive HER-2 positive status post bilateral mastectomies (Right CVA, Rt IJ DVT) Followed by 6 cycles of [place] followed by Herceptin maintenance completed 04/17/2011  Letrozole June 2012-June 2017 BCI: High risk: 10% risk of late distant recurrence, no probability of benefit from extended adjuvant therapy.  Breast Cancer Surveillance: 1. Breast exam1/12/2020: Benign 2. No role of imaging studies since she had bilateral mastectomies 3. Bone density 05/24/2019: normal T score 0.1  4.  Bone scan 04/21/2019: Questionable metastatic lesions at T11 and left frontal calvarium 5. MRI thoracic spine: No evidence of metastatic disease. Therefore no additional work-up was performed.  Return to clinic in 1 year for follow-up

## 2020-05-27 ENCOUNTER — Other Ambulatory Visit: Payer: Self-pay

## 2020-05-27 ENCOUNTER — Telehealth: Payer: Self-pay

## 2020-05-27 ENCOUNTER — Ambulatory Visit (HOSPITAL_COMMUNITY)
Admission: RE | Admit: 2020-05-27 | Discharge: 2020-05-27 | Disposition: A | Discharge status: Home / Self Care | Payer: Medicare Other | Source: Ambulatory Visit | Attending: Hematology and Oncology | Admitting: Hematology and Oncology

## 2020-05-27 ENCOUNTER — Inpatient Hospital Stay: Payer: Medicare Other

## 2020-05-27 ENCOUNTER — Inpatient Hospital Stay: Payer: Medicare Other | Attending: Hematology and Oncology | Admitting: Hematology and Oncology

## 2020-05-27 VITALS — BP 160/73 | HR 87 | Temp 98.1°F | Resp 18 | Ht 68.0 in | Wt 232.5 lb

## 2020-05-27 DIAGNOSIS — Z9013 Acquired absence of bilateral breasts and nipples: Secondary | ICD-10-CM | POA: Diagnosis not present

## 2020-05-27 DIAGNOSIS — Z170 Estrogen receptor positive status [ER+]: Secondary | ICD-10-CM

## 2020-05-27 DIAGNOSIS — C50412 Malignant neoplasm of upper-outer quadrant of left female breast: Secondary | ICD-10-CM | POA: Diagnosis not present

## 2020-05-27 DIAGNOSIS — Z8673 Personal history of transient ischemic attack (TIA), and cerebral infarction without residual deficits: Secondary | ICD-10-CM | POA: Diagnosis not present

## 2020-05-27 DIAGNOSIS — R071 Chest pain on breathing: Secondary | ICD-10-CM | POA: Diagnosis not present

## 2020-05-27 DIAGNOSIS — Z79899 Other long term (current) drug therapy: Secondary | ICD-10-CM | POA: Insufficient documentation

## 2020-05-27 DIAGNOSIS — Z79811 Long term (current) use of aromatase inhibitors: Secondary | ICD-10-CM | POA: Insufficient documentation

## 2020-05-27 DIAGNOSIS — R079 Chest pain, unspecified: Secondary | ICD-10-CM | POA: Diagnosis not present

## 2020-05-27 DIAGNOSIS — Z853 Personal history of malignant neoplasm of breast: Secondary | ICD-10-CM | POA: Diagnosis not present

## 2020-05-27 DIAGNOSIS — Z86718 Personal history of other venous thrombosis and embolism: Secondary | ICD-10-CM | POA: Diagnosis not present

## 2020-05-27 DIAGNOSIS — R0602 Shortness of breath: Secondary | ICD-10-CM | POA: Diagnosis not present

## 2020-05-27 LAB — CMP (CANCER CENTER ONLY)
ALT: 28 U/L (ref 0–44)
AST: 23 U/L (ref 15–41)
Albumin: 4.3 g/dL (ref 3.5–5.0)
Alkaline Phosphatase: 64 U/L (ref 38–126)
Anion gap: 6 (ref 5–15)
BUN: 17 mg/dL (ref 8–23)
CO2: 27 mmol/L (ref 22–32)
Calcium: 9.9 mg/dL (ref 8.9–10.3)
Chloride: 108 mmol/L (ref 98–111)
Creatinine: 1.52 mg/dL — ABNORMAL HIGH (ref 0.44–1.00)
GFR, Estimated: 36 mL/min — ABNORMAL LOW (ref >60)
Glucose, Bld: 116 mg/dL — ABNORMAL HIGH (ref 70–99)
Potassium: 4.1 mmol/L (ref 3.5–5.1)
Sodium: 141 mmol/L (ref 135–145)
Total Bilirubin: 0.5 mg/dL (ref 0.3–1.2)
Total Protein: 7.5 g/dL (ref 6.5–8.1)

## 2020-05-27 MED ORDER — IOHEXOL 350 MG/ML SOLN
75.0000 mL | Freq: Once | INTRAVENOUS | Status: AC | PRN
  Administered 2020-05-27: 75 mL via INTRAVENOUS

## 2020-05-27 NOTE — Telephone Encounter (Signed)
Pt aware of CT Angio results.  Per MD - Dr. Clayborne Dana office notified to set future appointment up.  Pt aware, verbalized understanding and agreement.

## 2020-05-27 NOTE — Progress Notes (Signed)
Patient Care Team: Marty Heck, DO as PCP - General (Internal Medicine) Bensimhon, Shaune Pascal, MD as Referring Physician (Cardiology) Rutherford Guys, MD as Consulting Physician (Ophthalmology)  DIAGNOSIS:    ICD-10-CM   1. Malignant neoplasm of upper-outer quadrant of left breast in female, estrogen receptor positive ([place])  C50.412 CMP ([facility] only)   Z17.0   2. Chest pain on breathing  R07.1 CT ANGIO CHEST PE W OR WO CONTRAST    CMP (Cancer Center only)    SUMMARY OF ONCOLOGIC HISTORY: Oncology History  Breast cancer of upper-outer quadrant of left female breast ([place])  01/15/2010 Surgery    bilateral mastectomies: left breast IDC , ER/PR positive HER-2 positive  stage II a;  right breast benign   04/17/2010 - 04/17/2011 Chemotherapy   weekly Taxotere carboplatinum and Herceptin x 6 cycles  followed by Herceptin maintenance   09/01/2010 - 09/02/2015 Anti-estrogen oral therapy    letrozole 2.5 mg daily (BCI revealed 10%, high risk of recurrence but low probability of benefit from extended adjuvant therapy)     CHIEF COMPLIANT: Surveillance of breast cancer  INTERVAL HISTORY: [name] is a 70 y.o. with above-mentioned history of left breast cancer treated with bilateral mastectomies, adjuvant chemotherapy, and antiestrogen therapy with letrozole completed in 2017. Bone scan on 05/10/20 showed no evidence of osseous metastatic disease. She presents to the clinic today for follow-up.   Her major complaint today is shortness of breath to minimal exertion.  ALLERGIES:  is allergic to atorvastatin, hctz [hydrochlorothiazide], norvasc [amlodipine besylate], other, and rosuvastatin.  MEDICATIONS:  Current Outpatient Medications  Medication Sig Dispense Refill  . albuterol (PROAIR HFA) 108 (90 Base) MCG/ACT inhaler Inhale 1-2 puffs into the lungs every 6 (six) hours as needed for wheezing or shortness of breath. 6.7 g 11  . amLODipine (NORVASC) 5 MG tablet TAKE 1 TABLET  BY MOUTH  DAILY 90 tablet 3  . Blood Glucose Calibration (TRUE METRIX LEVEL 1) Low SOLN Please use as directed 3 each 0  . Blood Glucose Monitoring Suppl (TRUE METRIX AIR GLUCOSE METER) DEVI 1 Device by Does not apply route 3 (three) times daily as needed. Check glucose up to three times per day as needed 1 Device 0  . carvedilol (COREG) 25 MG tablet TAKE 1 TABLET BY MOUTH  TWICE DAILY WITH MEALS (Patient taking differently: Take 25 mg by mouth 2 (two) times daily with a meal.) 180 tablet 3  . cefadroxil (DURICEF) 500 MG capsule Take 500 mg by mouth 2 (two) times daily.    . cholecalciferol (VITAMIN D) 1000 UNITS tablet Take 1,000 Units by mouth daily.    . diclofenac Sodium (VOLTAREN) 1 % GEL APPLY 4 GRAMS TOPICALLY 4  TIMES DAILY AS NEEDED 100 g 0  . famotidine (PEPCID) 20 MG tablet TAKE 1 TABLET BY MOUTH EVERY DAY 90 tablet 1  . fluticasone (FLONASE) 50 MCG/ACT nasal spray USE 2 SPRAYS IN BOTH&amp;nbsp;&amp;nbsp;NOSTRILS DAILY 48 g 1  . glucose blood (TRUE METRIX BLOOD GLUCOSE TEST) test strip The patient is insulin requiring, ICD 10 code E11.9. The patient tests 3 times per day. 100 each 12  . hydrALAZINE (APRESOLINE) 100 MG tablet Take 100 mg by mouth 2 (two) times daily.    . hydrALAZINE (APRESOLINE) 25 MG tablet Take 3 tablets (75 mg total) by mouth 2 (two) times daily. 90 tablet 1  . hydrocortisone (PROCTOZONE-HC) 2.5 % rectal cream Place 1 application rectally 2 (two) times daily. 30 g 0  .  loratadine (CLARITIN) 10 MG tablet Take 1 tablet (10 mg total) by mouth daily. 90 tablet 3  . LORazepam (ATIVAN) 1 MG tablet Take 1 tablet (1 mg total) by mouth daily as needed for anxiety (Panic Attacks). 10 tablet 0  . metFORMIN (GLUCOPHAGE-XR) 500 MG 24 hr tablet TAKE 2 TABLETS BY MOUTH  DAILY WITH BREAKFAST (Patient taking differently: Take 1,000 mg by mouth daily with breakfast.) 180 tablet 3  . Multiple Vitamin (MULTIVITAMIN) tablet Take 1 tablet by mouth daily.    Marland Kitchen olmesartan (BENICAR) 40 MG  tablet TAKE 1 TABLET BY MOUTH IN  THE EVENING 90 tablet 3  . simvastatin (ZOCOR) 20 MG tablet Take 1 tablet (20 mg total) by mouth at bedtime. 90 tablet 3  . traMADol (ULTRAM) 50 MG tablet Take 1 tablet (50 mg total) by mouth daily. 90 tablet 0  . TRUEplus Lancets 33G MISC The patient is insulin requiring, ICD 10 code E11.9. The patient tests 3 times per day. 100 each 12   No current facility-administered medications for this visit.    PHYSICAL EXAMINATION: ECOG PERFORMANCE STATUS: 1 - Symptomatic but completely ambulatory  Vitals:   05/27/20 0947  BP: (!) 160/73  Pulse: 87  Resp: 18  Temp: 98.1 F (36.7 C)  SpO2: 100%   Filed Weights   05/27/20 0947  Weight: 232 lb 8 oz (105.5 kg)    BREAST: No palpable masses or nodules in either right or left breasts. No palpable axillary supraclavicular or infraclavicular adenopathy no breast tenderness or nipple discharge. (exam performed in the presence of a chaperone)  LABORATORY DATA:  I have reviewed the data as listed CMP Latest Ref Rng & Units 05/27/2020 04/30/2020 12/18/2019  Glucose 70 - 99 mg/dL 116(H) 104(H) 105(H)  BUN 8 - 23 mg/dL 17 16 26(H)  Creatinine 0.44 - 1.00 mg/dL 1.52(H) 1.37(H) 1.60(H)  Sodium 135 - 145 mmol/L 141 143 140  Potassium 3.5 - 5.1 mmol/L 4.1 4.0 4.3  Chloride 98 - 111 mmol/L 108 106 107  CO2 22 - 32 mmol/L $RemoveB'27 30 25  'tDZazlNJ$ Calcium 8.9 - 10.3 mg/dL 9.9 9.5 9.6  Total Protein 6.5 - 8.1 g/dL 7.5 - -  Total Bilirubin 0.3 - 1.2 mg/dL 0.5 - -  Alkaline Phos 38 - 126 U/L 64 - -  AST 15 - 41 U/L 23 - -  ALT 0 - 44 U/L 28 - -    Lab Results  Component Value Date   WBC 6.4 12/18/2019   HGB 11.4 (L) 12/18/2019   HCT 34.7 (L) 12/18/2019   MCV 85.9 12/18/2019   PLT 264 12/18/2019   NEUTROABS 2.4 08/03/2017    ASSESSMENT & PLAN:  Breast cancer of upper-outer quadrant of left female breast ([place]) Left breast invasive ductal carcinoma stage II a ER/PR positive HER-2 positive status post bilateral mastectomies (Right  CVA, Rt IJ DVT) Followed by 6 cycles of [place] followed by Herceptin maintenance completed 04/17/2011  Letrozole June 2012-June 2017 BCI: High risk: 10% risk of late distant recurrence, no probability of benefit from extended adjuvant therapy.  Breast Cancer Surveillance: 1. Breast exam1/12/2020: Benign 2. No role of imaging studies since she had bilateral mastectomies 3. Bone density 05/24/2019: normal T score 0.1  4.  Bone scan 04/21/2019: Questionable metastatic lesions at T11 and left frontal calvarium Repeat bone scan 05/12/2020: No evidence of metastatic disease to the bone. 5. MRI thoracic spine 05/03/2019: No evidence of metastatic disease.   Shortness of breath to minimal exertion, chest pain,  fatigue: We will obtain a CT with PE protocol today.  I will refer her to cardiology and I contacted Dr. Haroldine Laws.  She might need stress test or additional cardiac evaluations. The pain is slightly reproducible and therefore musculoskeletal causes are also possible.  Return to clinic in 1 year for follow-up    Orders Placed This Encounter  Procedures  . CT ANGIO CHEST PE W OR WO CONTRAST    Standing Status:   Future    Standing Expiration Date:   05/27/2021    Order Specific Question:   If indicated for the ordered procedure, I authorize the administration of contrast media per Radiology protocol    Answer:   Yes    Order Specific Question:   Preferred imaging location?    Answer:   [facility]    Order Specific Question:   Release to patient    Answer:   Immediate    Order Specific Question:   Call Results- Best Contact Number?    Answer:   [phone]- pt can leave after the exam is complete   . CMP ([facility] only)    Standing Status:   Future    Number of Occurrences:   1    Standing Expiration Date:   05/27/2021   The patient has a good understanding of the overall plan. she agrees with it. she will call with any problems that may develop before the next visit  here.  Total time spent: 20 mins including face to face time and time spent for planning, charting and coordination of care  Rulon Eisenmenger, MD, MPH 05/27/2020  I, Cloyde Reams Dorshimer, am acting as scribe for Dr. Nicholas Lose.  I have reviewed the above documentation for accuracy and completeness, and I agree with the above.

## 2020-05-28 ENCOUNTER — Telehealth (HOSPITAL_COMMUNITY): Payer: Self-pay | Admitting: *Deleted

## 2020-05-28 ENCOUNTER — Telehealth: Payer: Self-pay | Admitting: Hematology and Oncology

## 2020-05-28 DIAGNOSIS — I5032 Chronic diastolic (congestive) heart failure: Secondary | ICD-10-CM

## 2020-05-28 DIAGNOSIS — R0600 Dyspnea, unspecified: Secondary | ICD-10-CM

## 2020-05-28 NOTE — Telephone Encounter (Signed)
Per 3/7 los, no changes made to pt schedule

## 2020-05-28 NOTE — Telephone Encounter (Signed)
Echo order placed, appts scheduled

## 2020-05-28 NOTE — Telephone Encounter (Signed)
-----   Message from Jolaine Artist, MD sent at 05/27/2020 10:46 AM EST ----- Regarding: RE: CP, SOB and palpitations Matina Rodier - can we work her in with an echo this week.    ----- Message ----- From: Nicholas Lose, MD Sent: 05/27/2020   9:58 AM EST To: Jolaine Artist, MD Subject: CP, SOB and palpitations                       Dan Ms Bouchillon is a cancer survivor (who saw you before) and shes experiencing SOB to min exertion and dull chest discomfort for 2 months. I am getting a CT PE protocol. Can you see her this week and plan any tests needed? Thanks [employer]

## 2020-05-30 ENCOUNTER — Encounter (HOSPITAL_COMMUNITY): Payer: Self-pay | Admitting: Internal Medicine

## 2020-05-30 ENCOUNTER — Other Ambulatory Visit: Payer: Self-pay

## 2020-05-30 ENCOUNTER — Ambulatory Visit (HOSPITAL_COMMUNITY)
Admission: RE | Admit: 2020-05-30 | Discharge: 2020-05-30 | Disposition: A | Discharge status: Home / Self Care | Payer: Medicare Other | Source: Ambulatory Visit | Attending: Internal Medicine | Admitting: Internal Medicine

## 2020-05-30 ENCOUNTER — Ambulatory Visit (HOSPITAL_BASED_OUTPATIENT_CLINIC_OR_DEPARTMENT_OTHER)
Admission: RE | Admit: 2020-05-30 | Discharge: 2020-05-30 | Disposition: A | Discharge status: Home / Self Care | Payer: Medicare Other | Source: Ambulatory Visit | Attending: Hematology and Oncology | Admitting: Hematology and Oncology

## 2020-05-30 VITALS — BP 152/80 | HR 78 | Wt 231.6 lb

## 2020-05-30 DIAGNOSIS — I130 Hypertensive heart and chronic kidney disease with heart failure and stage 1 through stage 4 chronic kidney disease, or unspecified chronic kidney disease: Secondary | ICD-10-CM | POA: Insufficient documentation

## 2020-05-30 DIAGNOSIS — R079 Chest pain, unspecified: Secondary | ICD-10-CM | POA: Diagnosis not present

## 2020-05-30 DIAGNOSIS — Z9013 Acquired absence of bilateral breasts and nipples: Secondary | ICD-10-CM | POA: Insufficient documentation

## 2020-05-30 DIAGNOSIS — Z803 Family history of malignant neoplasm of breast: Secondary | ICD-10-CM | POA: Insufficient documentation

## 2020-05-30 DIAGNOSIS — I10 Essential (primary) hypertension: Secondary | ICD-10-CM

## 2020-05-30 DIAGNOSIS — Z170 Estrogen receptor positive status [ER+]: Secondary | ICD-10-CM | POA: Diagnosis not present

## 2020-05-30 DIAGNOSIS — Z853 Personal history of malignant neoplasm of breast: Secondary | ICD-10-CM | POA: Diagnosis not present

## 2020-05-30 DIAGNOSIS — Z8249 Family history of ischemic heart disease and other diseases of the circulatory system: Secondary | ICD-10-CM | POA: Insufficient documentation

## 2020-05-30 DIAGNOSIS — R0600 Dyspnea, unspecified: Secondary | ICD-10-CM | POA: Insufficient documentation

## 2020-05-30 DIAGNOSIS — I5032 Chronic diastolic (congestive) heart failure: Secondary | ICD-10-CM | POA: Diagnosis not present

## 2020-05-30 DIAGNOSIS — Z794 Long term (current) use of insulin: Secondary | ICD-10-CM | POA: Diagnosis not present

## 2020-05-30 DIAGNOSIS — Z79899 Other long term (current) drug therapy: Secondary | ICD-10-CM | POA: Diagnosis not present

## 2020-05-30 LAB — ECHOCARDIOGRAM COMPLETE
AR max vel: 2.44 cm2
AV Area VTI: 2.25 cm2
AV Area mean vel: 2.33 cm2
AV Mean grad: 3 mmHg
AV Peak grad: 5.3 mmHg
Ao pk vel: 1.15 m/s
Area-P 1/2: 3.17 cm2
MV VTI: 1.98 cm2
S' Lateral: 2.4 cm

## 2020-05-30 MED ORDER — AMLODIPINE BESYLATE 10 MG PO TABS: 10.0000 mg | ORAL_TABLET | Freq: Every day | ORAL | 3 refills | Status: DC

## 2020-05-30 NOTE — Progress Notes (Signed)
  Echocardiogram 2D Echocardiogram has been performed.  [name] 05/30/2020, 11:52 AM

## 2020-05-30 NOTE — Progress Notes (Signed)
[facility] NOTE  Patient ID: [name], female   DOB: [birth year], 70 y.o.   MRN: [id number]    HPI:  [name] is a 70y/o women with h/o obesity, HTN, asthma, breast CA and diastolic HF referred back to by Dr. Lindi Adie for furthe evaluation of recurrent CP and SOB.   Found to have L ductal breast CA. ER/PR +. HER 2 neu was equivocal. s/p bilateral mastectomies in 01/2010. Lymph nodes negative.   She completed herceptin therapy Jan. 2013.  PSG 06/2012 showed mild OSA - she felt very claustrophobic,she could not afford the CPAP - hence abandoned  Echo 05/30/2012: EF 73-53% Grade 1 diastolic dysfunction Myoview 11/14 LV Ejection Fraction: 68%. LV Wall Motion: NL LV Function; NL Wall Motion  We have not seen her since 2018. She returns for further evaluation of CP. Says a year ago she was lifting something heavy and felt pain in her left chest and shoulder. It got mostly better but never completely went away. Still hurts if she pushes on it. Doesn't hurt when walking but if she gets excited she has pain. Takes tramadol and goes away. Still gets SOB easily.   Echo today 05/30/20 EF 60-65% grade 2 DD Personally reviewed  Review of Systems: [y] = yes, [ ]  = no    General: Weight gain [] ; Weight loss [ ] ; Anorexia [ ] ; Fatigue [y]; Fever [ ] ; Chills [ ] ; Weakness [ ]    Cardiac: Chest pain/pressure Blue.Reese ]; Resting SOB [ ] ; Exertional SOB [y]; Orthopnea [ ] ; Pedal Edema [y]; Palpitations [ ] ; Syncope [ ] ; Presyncope [ ] ; Paroxysmal nocturnal dyspnea[ ]    Pulmonary: Cough [ ] ; Wheezing[ ] ; Hemoptysis[ ] ; Sputum [ ] ; Snoring [ ]    GI: Vomiting[ ] ; Dysphagia[ ] ; Melena[ ] ; Hematochezia [ ] ; Heartburn[ ] ; Abdominal pain [ ] ; Constipation [ ] ; Diarrhea [ ] ; BRBPR [ ]    GU: Hematuria[ ] ; Dysuria [ ] ; Nocturia[ ]   Vascular: Pain in legs with walking [ ] ; Pain in feet with lying flat [ ] ; Non-healing sores [ ] ; Stroke [ ] ; TIA [ ] ; Slurred speech [ ] ;   Neuro: Headaches[y  ]; Vertigo[ ] ; Seizures[ ] ; Paresthesias[ ] ;Blurred vision [ ] ; Diplopia [ ] ; Vision changes [ ]    Ortho/Skin: Arthritis [y]; Joint pain [y]; Muscle pain [ ] ; Joint swelling [ ] ; Back Pain Blue.Reese ]; Rash [ ]    Psych: Depression[y ]; Anxiety[y ]   Heme: Bleeding problems [ ] ; Clotting disorders [ ] ; Anemia [ ]    Endocrine: Diabetes [ ] ; Thyroid dysfunction[ ]    Past Medical History:  Diagnosis Date  . Anemia   . Anxiety   . Arthritis    DJD, lumbar spondylosis  . Asthma   . Back pain   . Bilateral renal cysts   . Breast cancer ([place])    left breast invasive ductal ca in remission as of 10/07/11 s/p double mastectomy  . Breast cancer of upper-outer quadrant of left female breast ([place]) 01/15/2010   Per Dr. Laurelyn Sickle last clinic note 04/2012:  stage II a invasive ductal carcinoma of the left breast diagnosed in October 2011. She was found to have a ER/PR positive HER-2/neu positive breast cancer.She underwent bilateral mastectomies. Her right mastectomy was an elective prophylactic mastectomy due to her on comfort. Patient subsequently underwent adjuvant chemotherapy consisting of Taxotere carbop  . Bronchitis 03/10/2012  . Cataract   . Chemotherapy follow-up examination 03/06/2011  . Colon polyps   . Cough 03/06/2011  .  Depression    seasonal melancholia  . E. coli UTI   . Edema extremities   . GERD (gastroesophageal reflux disease)   . History of migraine headaches   . Hyperlipidemia   . Hypertension   . Ileitis   . Kidney stones    non obstructive   . Myopathy    not statin related, cause unknown    Current Outpatient Medications on File Prior to Encounter  Medication Sig Dispense Refill  . albuterol (PROAIR HFA) 108 (90 Base) MCG/ACT inhaler Inhale 1-2 puffs into the lungs every 6 (six) hours as needed for wheezing or shortness of breath. 6.7 g 11  . amLODipine (NORVASC) 5 MG tablet TAKE 1 TABLET BY MOUTH  DAILY 90 tablet 3  . Blood Glucose Calibration (TRUE METRIX LEVEL 1)  Low SOLN Please use as directed 3 each 0  . Blood Glucose Monitoring Suppl (TRUE METRIX AIR GLUCOSE METER) DEVI 1 Device by Does not apply route 3 (three) times daily as needed. Check glucose up to three times per day as needed 1 Device 0  . carvedilol (COREG) 25 MG tablet TAKE 1 TABLET BY MOUTH  TWICE DAILY WITH MEALS (Patient taking differently: TAKE 1 TABLET BY MOUTH  TWICE DAILY WITH MEALS) 180 tablet 3  . cholecalciferol (VITAMIN D) 1000 UNITS tablet Take 1,000 Units by mouth daily.    . diclofenac Sodium (VOLTAREN) 1 % GEL APPLY 4 GRAMS TOPICALLY 4  TIMES DAILY AS NEEDED 100 g 0  . fluticasone (FLONASE) 50 MCG/ACT nasal spray USE 2 SPRAYS IN BOTH&amp;nbsp;&amp;nbsp;NOSTRILS DAILY 48 g 1  . glucose blood (TRUE METRIX BLOOD GLUCOSE TEST) test strip The patient is insulin requiring, ICD 10 code E11.9. The patient tests 3 times per day. 100 each 12  . hydrALAZINE (APRESOLINE) 100 MG tablet Take 100 mg by mouth 2 (two) times daily.    . hydrALAZINE (APRESOLINE) 100 MG tablet Take 100 mg by mouth 2 (two) times daily.    . hydrocortisone (PROCTOZONE-HC) 2.5 % rectal cream Place 1 application rectally 2 (two) times daily. 30 g 0  . loratadine (CLARITIN) 10 MG tablet Take 1 tablet (10 mg total) by mouth daily. 90 tablet 3  . LORazepam (ATIVAN) 1 MG tablet Take 1 tablet (1 mg total) by mouth daily as needed for anxiety (Panic Attacks). 10 tablet 0  . metFORMIN (GLUCOPHAGE-XR) 500 MG 24 hr tablet TAKE 2 TABLETS BY MOUTH  DAILY WITH BREAKFAST 180 tablet 3  . Multiple Vitamin (MULTIVITAMIN) tablet Take 1 tablet by mouth daily.    Marland Kitchen olmesartan (BENICAR) 40 MG tablet TAKE 1 TABLET BY MOUTH IN  THE EVENING 90 tablet 3  . simvastatin (ZOCOR) 20 MG tablet Take 1 tablet (20 mg total) by mouth at bedtime. 90 tablet 3  . traMADol (ULTRAM) 50 MG tablet Take 50 mg by mouth as needed.    . TRUEplus Lancets 33G MISC The patient is insulin requiring, ICD 10 code E11.9. The patient tests 3 times per day. 100 each 12    No current facility-administered medications on file prior to encounter.   Social History   Socioeconomic History  . Marital status: Single    Spouse name: Not on file  . Number of children: Not on file  . Years of education: Not on file  . Highest education level: Not on file  Occupational History  . Not on file  Tobacco Use  . Smoking status: Never Smoker  . Smokeless tobacco: Never Used  Vaping Use  .  Vaping Use: Never used  Substance and Sexual Activity  . Alcohol use: No    Alcohol/week: 0.0 standard drinks  . Drug use: No  . Sexual activity: Not on file    Comment: Hysterectomy  Other Topics Concern  . Not on file  Social History Narrative   Single, 2 kids   Never smoked   No alcohol use   No drugs   Laid off from [employer] after working there for 14 years.    Social Determinants of Health   Financial Resource Strain: Not on file  Food Insecurity: Not on file  Transportation Needs: Not on file  Physical Activity: Not on file  Stress: Not on file  Social Connections: Not on file   Family History  Problem Relation Age of Onset  . Heart disease Mother 85       CAD  . Breast cancer Mother   . Cancer Mother 29       Breast cancer, mother  . Cancer Father 54       colon cancer dx'ed age 71  . Diabetes Father   . Coronary artery disease Other   . Multiple sclerosis Daughter   . Diabetes Brother   . Diabetes Brother     Vitals:   05/30/20 1217  BP: (!) 152/80  Pulse: 78  SpO2: 96%  Weight: 105.1 kg (231 lb 9.6 oz)   PHYSICAL EXAM: General:  Well appearing. No resp difficulty HEENT: normal Neck: supple. no JVD. Carotids 2+ bilat; no bruits. No lymphadenopathy or thryomegaly appreciated. Cor: PMI nondisplaced. Regular rate & rhythm. No rubs, gallops or murmurs. Lungs: clear Abdomen: obese soft, nontender, nondistended. No hepatosplenomegaly. No bruits or masses. Good bowel sounds. Extremities: no cyanosis, clubbing, rash,  edema Neuro: alert & orientedx3, cranial nerves grossly intact. moves all 4 extremities w/o difficulty. Affect pleasant  ECG: NSR 73 + LVH .No ST-T abnormalities. Personally reviewed   ASSESSMENT & PLAN:  1) CP - Mostly atypical .Previous LexiScan Myoview 11/14 was negative. - Will repeat Myoview.  2) Chronic diastolic HF - Echo today 07/25/37 EF 60-65% grade 2 DD Personally reviewed - volume status looks good  3) Breast cancer  - finished chemo. Remains in remission. No change  4) HTN  - Blood pressure mildly elevated at home 130-150 range  - increase amlodipine to 10 daily.   Daniel Bensimhon,MD 12:42 PM

## 2020-05-30 NOTE — Patient Instructions (Addendum)
EKG done today.  No Labs done today.   INCREASE Amlodipine to 10mg  (1 tablet) by mouth daily.  No other medication changes were made. Please continue all current medications as prescribed.  Your physician recommends that you schedule a follow-up appointment in: 1 year. Please contact our office in February 2023 for a March 2023 appointment.  Your physician has requested that you have a lexiscan myoview. For further information please visit [email or web address]. Please follow instruction sheet, as given. This has to be approved through your insurance company prior to scheduling, once approved we will contact you to schedule an appointment.   If you have any questions or concerns before your next appointment please send [place] a message through [facility] or call our office at [phone].    TO LEAVE A MESSAGE FOR THE NURSE SELECT OPTION 2, PLEASE LEAVE A MESSAGE INCLUDING: . YOUR NAME . DATE OF BIRTH . CALL BACK NUMBER . REASON FOR CALL**this is important as we prioritize the call backs  YOU WILL RECEIVE A CALL BACK THE SAME DAY AS LONG AS YOU CALL BEFORE 4:00 PM   Do the following things EVERYDAY: 1) Weigh yourself in the morning before breakfast. Write it down and keep it in a log. 2) Take your medicines as prescribed 3) Eat low salt foods--Limit salt (sodium) to 2000 mg per day.  4) Stay as active as you can everyday 5) Limit all fluids for the day to less than 2 liters   At the [facility], you and your health needs are our priority. As part of our continuing mission to provide you with exceptional heart care, we have created designated Provider Care Teams. These Care Teams include your primary Cardiologist (physician) and Advanced Practice Providers (APPs- Physician Assistants and Nurse Practitioners) who all work together to provide you with the care you need, when you need it.   You may see any of the following providers on your designated Care Team at your next  follow up: Marland Kitchen Dr [name] . Dr Loralie Champagne . Darrick Grinder, NP . Lyda Jester, PA . Audry Riles, PharmD   Please be sure to bring in all your medications bottles to every appointment.

## 2020-06-04 ENCOUNTER — Other Ambulatory Visit (HOSPITAL_COMMUNITY): Payer: Self-pay | Admitting: *Deleted

## 2020-06-04 DIAGNOSIS — I10 Essential (primary) hypertension: Secondary | ICD-10-CM

## 2020-06-04 MED ORDER — AMLODIPINE BESYLATE 10 MG PO TABS: 10.0000 mg | ORAL_TABLET | Freq: Every day | ORAL | 3 refills | Status: DC

## 2020-06-05 ENCOUNTER — Telehealth (HOSPITAL_COMMUNITY): Payer: Self-pay | Admitting: Cardiology

## 2020-06-05 DIAGNOSIS — I10 Essential (primary) hypertension: Secondary | ICD-10-CM

## 2020-06-05 MED ORDER — AMLODIPINE BESYLATE 10 MG PO TABS: 10.0000 mg | ORAL_TABLET | Freq: Every day | ORAL | 3 refills | Status: DC

## 2020-06-05 NOTE — Telephone Encounter (Signed)
Request medication (Amlodipine) to be sent to mail order pharmacy

## 2020-06-06 ENCOUNTER — Other Ambulatory Visit: Payer: Self-pay

## 2020-06-06 DIAGNOSIS — J302 Other seasonal allergic rhinitis: Secondary | ICD-10-CM

## 2020-06-06 MED ORDER — FLUTICASONE PROPIONATE 50 MCG/ACT NA SUSP: 2.0000 | Freq: Every day | NASAL | 3 refills | Status: DC

## 2020-06-07 ENCOUNTER — Ambulatory Visit (INDEPENDENT_AMBULATORY_CARE_PROVIDER_SITE_OTHER): Payer: Medicare Other | Admitting: Internal Medicine

## 2020-06-07 ENCOUNTER — Encounter: Payer: Self-pay | Admitting: Internal Medicine

## 2020-06-07 ENCOUNTER — Other Ambulatory Visit: Payer: Self-pay

## 2020-06-07 VITALS — BP 124/71 | HR 72 | Temp 98.2°F | Ht 68.0 in | Wt 235.4 lb

## 2020-06-07 DIAGNOSIS — I152 Hypertension secondary to endocrine disorders: Secondary | ICD-10-CM | POA: Diagnosis not present

## 2020-06-07 DIAGNOSIS — E1159 Type 2 diabetes mellitus with other circulatory complications: Secondary | ICD-10-CM | POA: Diagnosis not present

## 2020-06-07 DIAGNOSIS — E119 Type 2 diabetes mellitus without complications: Secondary | ICD-10-CM

## 2020-06-07 DIAGNOSIS — G8918 Other acute postprocedural pain: Secondary | ICD-10-CM

## 2020-06-07 DIAGNOSIS — R0789 Other chest pain: Secondary | ICD-10-CM | POA: Diagnosis not present

## 2020-06-07 DIAGNOSIS — Z7984 Long term (current) use of oral hypoglycemic drugs: Secondary | ICD-10-CM

## 2020-06-07 LAB — POCT GLYCOSYLATED HEMOGLOBIN (HGB A1C): Hemoglobin A1C: 6.3 % — AB (ref 4.0–5.6)

## 2020-06-07 LAB — GLUCOSE, CAPILLARY: Glucose-Capillary: 113 mg/dL — ABNORMAL HIGH (ref 70–99)

## 2020-06-07 MED ORDER — HYDRALAZINE HCL 50 MG PO TABS: 75.0000 mg | ORAL_TABLET | Freq: Three times a day (TID) | ORAL | 2 refills | Status: DC

## 2020-06-07 MED ORDER — DULOXETINE HCL 30 MG PO CPEP: ORAL_CAPSULE | ORAL | 0 refills | Status: DC

## 2020-06-07 MED ORDER — DULOXETINE HCL 60 MG PO CPEP: 60.0000 mg | ORAL_CAPSULE | Freq: Every day | ORAL | 5 refills | Status: DC

## 2020-06-07 NOTE — Assessment & Plan Note (Signed)
For the last six months she has been having some anterior chest pain with associated shortness of breath. It occurs randomly and feels like almost like a flutter or pulsing on the front of her chest and is reproducible when she pushes on the area. The pain does sometimes get worse when she gets excited or anxious. She does endorse some increased anxiety recently. She has a history of panic attacks for which she takes xanax very rarely. She has a history of asthma and breast cancer s/p mastectomy and has seen her pulmonologist and oncologist and heart failure for the pain recently.  CXR with pulmonology normal and recommended cardiac work-up. Breast exam 04/01/20 with oncology was benign, bone scan 05/12/2020 normal, CT angio was normal. Echo 05/30/20 showed EF of 60-65% with grade 2 diastolic dysfunction. Heart failure is planning lexiscan myoview.  On physical exam her pain is very reproducible. She was symptomatic during our visit and rate and rhythm were regular. GAD-7 done was only 4. 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 Important to rule out more urgent medical conditions.   - start cymbalta 30 mg - increase to 60 mg in one week - this will help target both symptoms of anxiety and post-mastectomy pain  - f/u myoview  - f/u in one month to reassess symptoms - if no improvement can consider gabapentin  - discussed continuing to take minimal amount of tramadol and potential interactions

## 2020-06-07 NOTE — Assessment & Plan Note (Signed)
A1c 6.3 today from 6.3 six months ago. She normally checks glucose but forgot to bring in her monitor today.   - continue metformin xr 1000 mg qd - f/u a1c in 3-6 months

## 2020-06-07 NOTE — Patient Instructions (Addendum)
Thank you for allowing [place] to provide your care today. Today we discussed your chest pain.   I have ordered the following labs for you:    I will call if any are abnormal.    Today we made the following changes to your medications:   Please START taking  Cymbalta 30 mg - take one tablet for 7 days  THEN increase to two tablets per day  After 30 days: THEN start cymbalta 60 mg - take one tablet per day   Take Hydralazine 75 mg - take 1.5 tablets three times per day (morning, afternoon, evening)  Please minimize how much tramadol you take with this. A small amount is ok.   Please follow-up in one month assess improvement.    Please call the internal medicine center clinic if you have any questions or concerns, we may be able to help and keep you from a long and expensive emergency room wait. Our clinic and after hours phone number is [phone], the best time to call is Monday through Friday 9 am to 4 pm but there is always someone available 24/7 if you have an emergency. If you need medication refills please notify your pharmacy one week in advance and they will send [place] a request.

## 2020-06-07 NOTE — Assessment & Plan Note (Signed)
  BP Readings from Last 3 Encounters:  06/07/20 124/71  05/30/20 (!) 152/80  05/27/20 (!) 160/73   BP currently well controlled. Medications include olmesartan 40 mg qd, hydralazine, and norvasc, which was increased one week ago to 10 mg due to elevated blood pressure. Her hydralazine has been 75 mg tid previously but appears to have changed to 100 mg bid somehow.   - cont. olmesartan 40 mg qd and norvasc 10 mg qd  - change hydralazine to 75 mg tid  - f/u in one month for blood pressure check

## 2020-06-07 NOTE — Progress Notes (Signed)
[facility]  CC: type II diabetes   HPI:  [name] is a 70 y.o. with PMH as below.   Please see A&P for assessment of the patient's acute and chronic medical conditions.   TIIDM  A1c 6.3 today. She normally checks glucose but forgot to bring in her monitor today.   BP currently well controlled. Medications include olmesartan 40 mg qd, hydralazine, and norvasc, which was increased one week ago to 10 mg due to elevated blood pressure. Her hydralazine has been 75 mg tid previously but appears to have changed to 100 mg bid somehow.   For the last six months she has been having some anterior chest pain with associated shortness of breath. It occurs randomly and feels like almost like a flutter or pulsing on the front of her chest and is reproducible when she pushes on the area. The pain does sometimes get worse when she gets excited or anxious. She does endorse some increased anxiety recently. She has a history of panic attacks for which she takes xanax very rarely. She has a history of asthma and breast cancer s/p mastectomy and has seen her pulmonologist and oncologist and heart failure for the pain recently.  CXR with pulmonology normal and recommended cardiac work-up. Breast exam 04/01/20 with oncology was benign, bone scan 05/12/2020 normal, CT angio was normal. Echo 05/30/20 showed EF of 60-65% with grade 2 diastolic dysfunction. Heart failure is planning lexiscan myoview.  On physical exam her pain is very reproducible. She was symptomatic during our visit and rate and rhythm were regular. GAD-7 done was only 4. 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 Important to rule out more urgent medical conditions.   Past Medical History:  Diagnosis Date  . Anemia   . Anxiety   . Arthritis    DJD, lumbar spondylosis  . Asthma   . Back pain   . Bilateral renal cysts   . Breast cancer ([place])    left breast invasive  ductal ca in remission as of 10/07/11 s/p double mastectomy  . Breast cancer of upper-outer quadrant of left female breast ([place]) 01/15/2010   Per Dr. Laurelyn Sickle last clinic note 04/2012:  stage II a invasive ductal carcinoma of the left breast diagnosed in October 2011. She was found to have a ER/PR positive HER-2/neu positive breast cancer.She underwent bilateral mastectomies. Her right mastectomy was an elective prophylactic mastectomy due to her on comfort. Patient subsequently underwent adjuvant chemotherapy consisting of Taxotere carbop  . Bronchitis 03/10/2012  . Cataract   . Chemotherapy follow-up examination 03/06/2011  . Colon polyps   . Cough 03/06/2011  . Depression    seasonal melancholia  . E. coli UTI   . Edema extremities   . GERD (gastroesophageal reflux disease)   . History of migraine headaches   . Hyperlipidemia   . Hypertension   . Ileitis   . Kidney stones    non obstructive   . Myopathy    not statin related, cause unknown    Review of Systems:   Review of Systems  Constitutional: Negative for chills, fever and weight loss.  Eyes: Negative for blurred vision and double vision.  Respiratory: Negative for cough, shortness of breath and wheezing.   Cardiovascular: Positive for chest pain and palpitations. Negative for orthopnea, claudication and leg swelling.  Gastrointestinal: Negative for abdominal pain, constipation, diarrhea, heartburn and nausea.  Musculoskeletal: Negative for falls.  Neurological: Negative for dizziness, tremors,  focal weakness, weakness and headaches.  Psychiatric/Behavioral: Negative for depression. The patient is nervous/anxious.      Physical Exam:   Constitution: NAD, appears stated age HENT: Eyes:  Cardio: RRR, no m/r/g, no LE edema  Respiratory: CTA, no w/r/r Abdominal: NTTP, soft, non-distended MSK: moving all extremities Neuro: normal affect, a&ox3 GU: Skin: c/d/i    Vitals:   06/07/20 1031 06/07/20 1117 06/07/20 1118  06/07/20 1119  BP: (!) 158/78 131/74 120/78 124/71  Pulse: 72 72    Temp: 98.2 F (36.8 C)     TempSrc: Oral     SpO2: 100%     Weight: 235 lb 6.4 oz (106.8 kg)     Height: $Remove'5\' 8"'vchGHhT$  (1.727 m)        Assessment & Plan:   See Encounters Tab for problem based charting.  Patient discussed with Dr. Rebeca Alert

## 2020-06-09 NOTE — Progress Notes (Signed)
Internal Medicine Clinic Attending  Case discussed with Dr. Seawell at the time of the visit.  We reviewed the resident's history and exam and pertinent patient test results.  I agree with the assessment, diagnosis, and plan of care documented in the resident's note.  Alexander Raines, M.D., Ph.D.  

## 2020-06-11 ENCOUNTER — Telehealth: Payer: Self-pay

## 2020-06-11 DIAGNOSIS — E1159 Type 2 diabetes mellitus with other circulatory complications: Secondary | ICD-10-CM

## 2020-06-11 MED ORDER — HYDRALAZINE HCL 50 MG PO TABS: 75.0000 mg | ORAL_TABLET | Freq: Three times a day (TID) | ORAL | 2 refills | Status: DC

## 2020-06-11 NOTE — Telephone Encounter (Signed)
Pt called and states she has 100mg  of hydralazine at home and she doesn't know how she is supposed to cut it effectively to get 75mg  for her new dosage of "75mg  TID from [place] [facility] on 06/07/20).  Pt also states she has hydralazine 25mg  tabs.  RN informed patient that hydralazine 50mg  tablets were sent to [facility] and she is to take 1.5 tablets three imes daily of the new hydralazine RX only and she verbalized understanding.  She is requesting that this RX be sent to her mail off pharmacy, [facility].  She states she will take #3 25mg  hydralazine three times daily until she receives her new RX.  RN placed TC to [facility]/ [facility]e Belts and cancelled [employer] .  Will send request to resend RX for Hydralazine 50mg  to [facility] to PCP, please change quantity to #135 Thanks, [employer]

## 2020-06-11 NOTE — Telephone Encounter (Signed)
Pls contact pt regarding [phone] medicine dosage

## 2020-06-12 ENCOUNTER — Telehealth (HOSPITAL_COMMUNITY): Payer: Self-pay | Admitting: *Deleted

## 2020-06-12 DIAGNOSIS — R079 Chest pain, unspecified: Secondary | ICD-10-CM

## 2020-06-12 NOTE — Telephone Encounter (Signed)
myoview attestation placed for Dr Haroldine Laws to document

## 2020-06-13 ENCOUNTER — Other Ambulatory Visit (HOSPITAL_COMMUNITY): Payer: Self-pay | Admitting: *Deleted

## 2020-06-13 DIAGNOSIS — I10 Essential (primary) hypertension: Secondary | ICD-10-CM

## 2020-06-13 DIAGNOSIS — E1159 Type 2 diabetes mellitus with other circulatory complications: Secondary | ICD-10-CM

## 2020-06-13 MED ORDER — AMLODIPINE BESYLATE 10 MG PO TABS: 10.0000 mg | ORAL_TABLET | Freq: Every day | ORAL | 3 refills | Status: DC

## 2020-06-13 MED ORDER — HYDRALAZINE HCL 50 MG PO TABS: 75.0000 mg | ORAL_TABLET | Freq: Three times a day (TID) | ORAL | 3 refills | Status: DC

## 2020-06-21 ENCOUNTER — Other Ambulatory Visit: Payer: Self-pay | Admitting: Internal Medicine

## 2020-06-21 DIAGNOSIS — G8918 Other acute postprocedural pain: Secondary | ICD-10-CM

## 2020-06-25 NOTE — Telephone Encounter (Signed)
It looks like this was ordered 2 weeks ago.

## 2020-07-08 ENCOUNTER — Encounter: Payer: Self-pay | Admitting: Internal Medicine

## 2020-07-08 ENCOUNTER — Ambulatory Visit (INDEPENDENT_AMBULATORY_CARE_PROVIDER_SITE_OTHER): Payer: Medicare Other | Admitting: Internal Medicine

## 2020-07-08 VITALS — BP 142/82 | HR 87 | Wt 237.5 lb

## 2020-07-08 DIAGNOSIS — E1159 Type 2 diabetes mellitus with other circulatory complications: Secondary | ICD-10-CM

## 2020-07-08 DIAGNOSIS — R300 Dysuria: Secondary | ICD-10-CM | POA: Diagnosis not present

## 2020-07-08 DIAGNOSIS — I152 Hypertension secondary to endocrine disorders: Secondary | ICD-10-CM | POA: Diagnosis not present

## 2020-07-08 DIAGNOSIS — E119 Type 2 diabetes mellitus without complications: Secondary | ICD-10-CM | POA: Diagnosis not present

## 2020-07-08 DIAGNOSIS — R0789 Other chest pain: Secondary | ICD-10-CM

## 2020-07-08 DIAGNOSIS — M722 Plantar fascial fibromatosis: Secondary | ICD-10-CM | POA: Diagnosis not present

## 2020-07-08 LAB — POCT URINALYSIS DIPSTICK
Bilirubin, UA: NEGATIVE
Blood, UA: NEGATIVE
Glucose, UA: NEGATIVE
Ketones, UA: NEGATIVE
Nitrite, UA: NEGATIVE
Protein, UA: NEGATIVE
Spec Grav, UA: 1.02 (ref 1.010–1.025)
Urobilinogen, UA: 0.2 E.U./dL
pH, UA: 5 (ref 5.0–8.0)

## 2020-07-08 NOTE — Assessment & Plan Note (Signed)
[name] is a 70 yo F w/ PMh of HTN, DM presenting to [facility] for follow up evaluation for chest pain. She was seen by Dr.Seawell 1 month prior and was advised to start duloxetine for treatment for possible post-mastectomy pain. She mentions that after starting duloxetine, she has been having improvement in her symptoms and it has mostly resolved. She is currently taking cymbalta 60mg  daily without significant side effects. She mentions taking her tramadol very infrequently. She states she has upcoming appt with cardiology for Myoview  - F/u myoview

## 2020-07-08 NOTE — Assessment & Plan Note (Signed)
Ms.Fredell is a 70 yo F w/ PMH of T2DM, HTN presenting with dysuria. She was in her usual state of health until last week when she started using a new soap. She began to noticed irritation of her groin as well as pain with urination and frequency. She mentions sxs feel similar to prior UTI. Denies any fevers, chills, nausea, vomiting, diarrhea. She denies any flank pain, hematuria.  A/P poc UA showing only trace leuks, discussed possibly pain being caused by skin irritation after new exposure to soap. Advised to change soaps and monitor for improvement. Advised on monitoring for red-flag sxs of infection and RTC if worsening sxs. Ms.Burrill expressed understanding. - Monitor

## 2020-07-08 NOTE — Assessment & Plan Note (Signed)
Presents w/ complaint of pain on plantar surface of L foot. Mentions that she was advised to use an insert for her shoe per podiatry. Physical exam show evidence of plantar fasciitis. Provided education on stretches she can perform at home  - Instructed on stretching, rest, elevation

## 2020-07-08 NOTE — Progress Notes (Signed)
CC: Dysuria  HPI: [name] is a 70 y.o. with PMH listed below presenting with complaint of dysuria. Please see problem based assessment and plan for further details.  Past Medical History:  Diagnosis Date  . Anemia   . Anxiety   . Arthritis    DJD, lumbar spondylosis  . Asthma   . Back pain   . Bilateral renal cysts   . Breast cancer ([place])    left breast invasive ductal ca in remission as of 10/07/11 s/p double mastectomy  . Breast cancer of upper-outer quadrant of left female breast ([place]) 01/15/2010   Per Dr. Laurelyn Sickle last clinic note 04/2012:  stage II a invasive ductal carcinoma of the left breast diagnosed in October 2011. She was found to have a ER/PR positive HER-2/neu positive breast cancer.She underwent bilateral mastectomies. Her right mastectomy was an elective prophylactic mastectomy due to her on comfort. Patient subsequently underwent adjuvant chemotherapy consisting of Taxotere carbop  . Bronchitis 03/10/2012  . Cataract   . Chemotherapy follow-up examination 03/06/2011  . Colon polyps   . Cough 03/06/2011  . Depression    seasonal melancholia  . E. coli UTI   . Edema extremities   . GERD (gastroesophageal reflux disease)   . History of migraine headaches   . Hyperlipidemia   . Hypertension   . Ileitis   . Kidney stones    non obstructive   . Myopathy    not statin related, cause unknown    Review of Systems: Review of Systems  Constitutional: Negative for chills, fever and malaise/fatigue.  Eyes: Negative for blurred vision.  Respiratory: Negative for shortness of breath.   Cardiovascular: Negative for chest pain, palpitations and leg swelling.  Gastrointestinal: Negative for constipation, diarrhea, nausea and vomiting.  Genitourinary: Positive for dysuria and urgency. Negative for frequency.  Skin: Positive for itching.    Physical Exam: Vitals:   07/08/20 1046  BP: (!) 142/82  Pulse: 87  SpO2: 100%  Weight: 237 lb 8 oz (107.7 kg)    Gen: Well-developed, well nourished, NAD HEENT: NCAT head, hearing intact CV: RRR, S1, S2 normal Pulm: CTAB, No rales, no wheezes Extm: ROM intact, Peripheral pulses intact, trace ankle pitting edema bilaterally, Tenderness on plantar surface of L foot with palpation, no warmth, mild erythema Skin: Dry, Warm, normal turgor, no wounds, no rashes, no lesions  Assessment & Plan:   DM type 2 (diabetes mellitus, type 2) (HCC) Lab Results  Component Value Date   HGBA1C 6.3 (A) 06/07/2020   [name] expresses concern regarding metformin after reading about its contraindication with renal disease. She denies any significant GI side effects and mentions she has been on metformin for year. Had discussion regarding risks and benefit of continued treatment for diabetes and discussed her current renal fx and safety vs risk of metformin when eGFR >30. [name] expressed understanding and agrees with continuing treatment.  - C/w metformin ER 1000mg  daily  Hypertension associated with diabetes ([place]) BP Readings from Last 3 Encounters:  07/08/20 (!) 142/82  06/07/20 124/71  05/30/20 (!) 152/80   [name] presents for f/u management of htn. Previously noted to be well controlled on current regimen of olmesartan 40mg  daily, amlodipine 10mg  daily, hydralazine 75mg  TID. She was able to bring her personal blood pressure cuff, which was able to be reviewed showing averaged blood pressure around 720N systolic, 47S diastolic. She denies any chest pain, palpitations, dyspnea.  A/P Above goal based on clinic readings but home bp records shows  at goal of less than 120/80. Elevated bp today likely in part due to white coat due to seeing new provider. C/w current therapy.  - Advised on DASH diet - C/w olmesartan 40mg  dailiy, amlodipine 10mg  daily, hydralazine 75mg  TID  Chest pain, musculoskeletal [name] is a 70 yo F w/ PMh of HTN, DM presenting to [facility] for follow up evaluation for chest pain. She  was seen by Dr.Seawell 1 month prior and was advised to start duloxetine for treatment for possible post-mastectomy pain. She mentions that after starting duloxetine, she has been having improvement in her symptoms and it has mostly resolved. She is currently taking cymbalta 60mg  daily without significant side effects. She mentions taking her tramadol very infrequently. She states she has upcoming appt with cardiology for Myoview  - F/u myoview  Plantar fasciitis of left foot Presents w/ complaint of pain on plantar surface of L foot. Mentions that she was advised to use an insert for her shoe per podiatry. Physical exam show evidence of plantar fasciitis. Provided education on stretches she can perform at home  - Instructed on stretching, rest, elevation  Dysuria [name] is a 70 yo F w/ PMH of T2DM, HTN presenting with dysuria. She was in her usual state of health until last week when she started using a new soap. She began to noticed irritation of her groin as well as pain with urination and frequency. She mentions sxs feel similar to prior UTI. Denies any fevers, chills, nausea, vomiting, diarrhea. She denies any flank pain, hematuria.  A/P poc UA showing only trace leuks, discussed possibly pain being caused by skin irritation after new exposure to soap. Advised to change soaps and monitor for improvement. Advised on monitoring for red-flag sxs of infection and RTC if worsening sxs. [name] expressed understanding. - Monitor   Patient discussed with Dr.Williams  -Gilberto Better, [facility] Pager: [phone]

## 2020-07-08 NOTE — Patient Instructions (Addendum)
Thank you for allowing [place] to provide your care today. Today we discussed your urinary symptoms    I have ordered  labs for you. I will call if any are abnormal.    Today we made no changes to your medications.    Please follow-up in 2 months.    Should you have any questions or concerns please call the internal medicine clinic at [phone].     Dysuria Dysuria is pain or discomfort during urination. The pain or discomfort may be felt in the part of the body that drains urine from the bladder (urethra) or in the surrounding tissue of the genitals. The pain may also be felt in the groin area, lower abdomen, or lower back. You may have to urinate frequently or have the sudden feeling that you have to urinate (urgency). Dysuria can affect anyone, but it is more common in females. Dysuria can be caused by many different things, including:  Urinary tract infection.  Kidney stones or bladder stones.  Certain STIs (sexually transmitted infections), such as chlamydia.  Dehydration.  Inflammation of the tissues of the vagina.  Use of certain medicines.  Use of certain soaps or scented products that cause irritation. Follow these instructions at home: Medicines  Take over-the-counter and prescription medicines only as told by your health care provider.  If you were prescribed an antibiotic medicine, take it as told by your health care provider. Do not stop taking the antibiotic even if you start to feel better. Eating and drinking  Drink enough fluid to keep your urine pale yellow.  Avoid caffeinated beverages, tea, and alcohol. These beverages can irritate the bladder and make dysuria worse. In males, alcohol may irritate the prostate.   General instructions  Watch your condition for any changes.  Urinate often. Avoid holding urine for long periods of time.  If you are female, you should wipe from front to back after urinating or having a bowel movement. Use each piece of toilet  paper only once.  Empty your bladder after sex.  Keep all follow-up visits. This is important.  If you had any tests done to find the cause of dysuria, it is up to you to get your test results. Ask your health care provider, or the department that is doing the test, when your results will be ready. Contact a health care provider if:  You have a fever.  You develop pain in your back or sides.  You have nausea or vomiting.  You have blood in your urine.  You are not urinating as often as you usually do. Get help right away if:  Your pain is severe and not relieved with medicines.  You cannot eat or drink without vomiting.  You are confused.  You have a rapid heartbeat while resting.  You have shaking or chills.  You feel extremely weak. Summary  Dysuria is pain or discomfort while urinating. Many different conditions can lead to dysuria.  If you have dysuria, you may have to urinate frequently or have the sudden feeling that you have to urinate (urgency).  Watch your condition for any changes. Keep all follow-up visits.  Make sure that you urinate often and drink enough fluid to keep your urine pale yellow. This information is not intended to replace advice given to you by your health care provider. Make sure you discuss any questions you have with your health care provider. Document Revised: 10/20/2019 Document Reviewed: 10/20/2019 Elsevier Patient Education  [place].

## 2020-07-08 NOTE — Assessment & Plan Note (Signed)
Lab Results  Component Value Date   HGBA1C 6.3 (A) 06/07/2020   Ms.Carrell expresses concern regarding metformin after reading about its contraindication with renal disease. She denies any significant GI side effects and mentions she has been on metformin for year. Had discussion regarding risks and benefit of continued treatment for diabetes and discussed her current renal fx and safety vs risk of metformin when eGFR >30. Ms.Titsworth expressed understanding and agrees with continuing treatment.  - C/w metformin ER $RemoveBefo'1000mg'[id number]$  daily

## 2020-07-08 NOTE — Assessment & Plan Note (Addendum)
BP Readings from Last 3 Encounters:  07/08/20 (!) 142/82  06/07/20 124/71  05/30/20 (!) 152/80   [name] presents for f/u management of htn. Previously noted to be well controlled on current regimen of olmesartan 40mg  daily, amlodipine 10mg  daily, hydralazine 75mg  TID. She was able to bring her personal blood pressure cuff, which was able to be reviewed showing averaged blood pressure around 151I systolic, 34P diastolic. She denies any chest pain, palpitations, dyspnea.  A/P Above goal based on clinic readings but home bp records shows at goal of less than 120/80. Elevated bp today likely in part due to white coat due to seeing new provider. C/w current therapy.  - Advised on DASH diet - C/w olmesartan 40mg  dailiy, amlodipine 10mg  daily, hydralazine 75mg  TID

## 2020-07-10 ENCOUNTER — Telehealth (HOSPITAL_COMMUNITY): Payer: Self-pay | Admitting: *Deleted

## 2020-07-10 NOTE — Progress Notes (Signed)
Internal Medicine Clinic Attending  Case discussed with Dr. Lee  At the time of the visit.  We reviewed the resident's history and exam and pertinent patient test results.  I agree with the assessment, diagnosis, and plan of care documented in the resident's note.    

## 2020-07-10 NOTE — Telephone Encounter (Signed)
Patient given detailed instructions per Myocardial Perfusion Study Information Sheet for the test on 07/15/20 at 1:00. Patient notified to arrive 15 minutes early and that it is imperative to arrive on time for appointment to keep from having the test rescheduled.  If you need to cancel or reschedule your appointment, please call the office within 24 hours of your appointment. . Patient verbalized understanding.[name]

## 2020-07-15 ENCOUNTER — Ambulatory Visit (HOSPITAL_COMMUNITY): Payer: Medicare Other | Attending: Cardiology

## 2020-07-15 ENCOUNTER — Other Ambulatory Visit: Payer: Self-pay

## 2020-07-15 DIAGNOSIS — R079 Chest pain, unspecified: Secondary | ICD-10-CM | POA: Diagnosis not present

## 2020-07-15 MED ORDER — TECHNETIUM TC 99M TETROFOSMIN IV KIT
31.9000 | PACK | Freq: Once | INTRAVENOUS | Status: AC | PRN
  Administered 2020-07-15: 31.9 via INTRAVENOUS
  Filled 2020-07-15: qty 32

## 2020-07-15 MED ORDER — REGADENOSON 0.4 MG/5ML IV SOLN
0.4000 mg | Freq: Once | INTRAVENOUS | Status: AC
  Administered 2020-07-15: 0.4 mg via INTRAVENOUS

## 2020-07-16 ENCOUNTER — Ambulatory Visit (HOSPITAL_COMMUNITY): Payer: Medicare Other | Attending: Cardiology

## 2020-07-16 LAB — MYOCARDIAL PERFUSION IMAGING
LV dias vol: 91 mL (ref 46–106)
LV sys vol: 25 mL
Peak HR: 103 {beats}/min
Rest HR: 80 {beats}/min
SDS: 1
SRS: 1
SSS: 2
TID: 0.94

## 2020-07-16 MED ORDER — TECHNETIUM TC 99M TETROFOSMIN IV KIT
31.6000 | PACK | Freq: Once | INTRAVENOUS | Status: AC | PRN
  Administered 2020-07-16: 31.6 via INTRAVENOUS
  Filled 2020-07-16: qty 32

## 2020-07-21 ENCOUNTER — Other Ambulatory Visit: Payer: Self-pay | Admitting: Internal Medicine

## 2020-08-14 ENCOUNTER — Other Ambulatory Visit: Payer: Self-pay

## 2020-08-14 DIAGNOSIS — M25561 Pain in right knee: Secondary | ICD-10-CM

## 2020-08-14 NOTE — Telephone Encounter (Signed)
hydrALAZINE (APRESOLINE) 50 MG tablet   diclofenac Sodium (VOLTAREN) 1 % GEL, REFILL REQUEST @  [facility], [address] [place], [address] Phone:  [phone]  Fax:  [phone]     Pt would like to speak with a nurse, please call back.

## 2020-08-14 NOTE — Telephone Encounter (Signed)
Hydralazine was refilled 06/13/20 Qty# 405 x 3 RF's per Dr Haroldine Laws. I called the pt who stated the pharmacy told her they need a new rx. I called [facility] , talked to [place] - stated rx sent by Dr Haroldine Laws was being held as a duplicate but will activate it and get it ready for the pt. The pt wanted to know if it can be changed to a 50 mg rx and 25 mg rx so she does not have to cut a pill in half - I told her I will ask the doctor.

## 2020-08-16 MED ORDER — DICLOFENAC SODIUM 1 % EX GEL: CUTANEOUS | 0 refills | Status: DC

## 2020-09-02 DIAGNOSIS — N1832 Chronic kidney disease, stage 3b: Secondary | ICD-10-CM | POA: Diagnosis not present

## 2020-09-04 ENCOUNTER — Other Ambulatory Visit: Payer: Self-pay

## 2020-09-04 ENCOUNTER — Emergency Department (HOSPITAL_COMMUNITY): Payer: Medicare Other

## 2020-09-04 ENCOUNTER — Emergency Department (HOSPITAL_COMMUNITY)
Admission: EM | Admit: 2020-09-04 | Discharge: 2020-09-05 | Disposition: A | Discharge status: Home / Self Care | Payer: Medicare Other | Attending: Emergency Medicine | Admitting: Emergency Medicine

## 2020-09-04 ENCOUNTER — Encounter (HOSPITAL_COMMUNITY): Payer: Self-pay | Admitting: Emergency Medicine

## 2020-09-04 DIAGNOSIS — Z79899 Other long term (current) drug therapy: Secondary | ICD-10-CM | POA: Diagnosis not present

## 2020-09-04 DIAGNOSIS — E1122 Type 2 diabetes mellitus with diabetic chronic kidney disease: Secondary | ICD-10-CM | POA: Insufficient documentation

## 2020-09-04 DIAGNOSIS — Z7984 Long term (current) use of oral hypoglycemic drugs: Secondary | ICD-10-CM | POA: Diagnosis not present

## 2020-09-04 DIAGNOSIS — Z7952 Long term (current) use of systemic steroids: Secondary | ICD-10-CM | POA: Insufficient documentation

## 2020-09-04 DIAGNOSIS — I130 Hypertensive heart and chronic kidney disease with heart failure and stage 1 through stage 4 chronic kidney disease, or unspecified chronic kidney disease: Secondary | ICD-10-CM | POA: Diagnosis not present

## 2020-09-04 DIAGNOSIS — Z853 Personal history of malignant neoplasm of breast: Secondary | ICD-10-CM | POA: Insufficient documentation

## 2020-09-04 DIAGNOSIS — M79675 Pain in left toe(s): Secondary | ICD-10-CM | POA: Insufficient documentation

## 2020-09-04 DIAGNOSIS — N183 Chronic kidney disease, stage 3 unspecified: Secondary | ICD-10-CM | POA: Diagnosis not present

## 2020-09-04 DIAGNOSIS — J45909 Unspecified asthma, uncomplicated: Secondary | ICD-10-CM | POA: Diagnosis not present

## 2020-09-04 DIAGNOSIS — I5032 Chronic diastolic (congestive) heart failure: Secondary | ICD-10-CM | POA: Insufficient documentation

## 2020-09-04 LAB — CBC WITH DIFFERENTIAL/PLATELET
Abs Immature Granulocytes: 0.01 10*3/uL (ref 0.00–0.07)
Basophils Absolute: 0.1 10*3/uL (ref 0.0–0.1)
Basophils Relative: 1 %
Eosinophils Absolute: 0.5 10*3/uL (ref 0.0–0.5)
Eosinophils Relative: 10 %
HCT: 33.2 % — ABNORMAL LOW (ref 36.0–46.0)
Hemoglobin: 10.8 g/dL — ABNORMAL LOW (ref 12.0–15.0)
Immature Granulocytes: 0 %
Lymphocytes Relative: 44 %
Lymphs Abs: 2.4 10*3/uL (ref 0.7–4.0)
MCH: 28.6 pg (ref 26.0–34.0)
MCHC: 32.5 g/dL (ref 30.0–36.0)
MCV: 87.8 fL (ref 80.0–100.0)
Monocytes Absolute: 0.4 10*3/uL (ref 0.1–1.0)
Monocytes Relative: 7 %
Neutro Abs: 2 10*3/uL (ref 1.7–7.7)
Neutrophils Relative %: 38 %
Platelets: 220 10*3/uL (ref 150–400)
RBC: 3.78 MIL/uL — ABNORMAL LOW (ref 3.87–5.11)
RDW: 12.9 % (ref 11.5–15.5)
WBC: 5.4 10*3/uL (ref 4.0–10.5)
nRBC: 0 % (ref 0.0–0.2)

## 2020-09-04 LAB — BASIC METABOLIC PANEL
Anion gap: 10 (ref 5–15)
BUN: 12 mg/dL (ref 8–23)
CO2: 26 mmol/L (ref 22–32)
Calcium: 9 mg/dL (ref 8.9–10.3)
Chloride: 106 mmol/L (ref 98–111)
Creatinine, Ser: 1.33 mg/dL — ABNORMAL HIGH (ref 0.44–1.00)
GFR, Estimated: 43 mL/min — ABNORMAL LOW (ref >60)
Glucose, Bld: 177 mg/dL — ABNORMAL HIGH (ref 70–99)
Potassium: 4 mmol/L (ref 3.5–5.1)
Sodium: 142 mmol/L (ref 135–145)

## 2020-09-04 NOTE — ED Provider Notes (Signed)
Emergency Medicine Provider Triage Evaluation Note  [name] , a 70 y.o. female  was evaluated in triage.  Pt complains of left great toe pain for the past day. Admits to intermittent bilateral neuropathy; however, notes her left great toe on the distal portion has been extremely painful. Pain is worse with palpation and with ambulation. No known trauma. She has a history of diabetes. No known sores. Denies fever and chills.  Review of Systems  Positive: arthralgia Negative: fever  Physical Exam  LMP 12/31/1968  Gen:   Awake, no distress   Resp:  Normal effort  MSK:   Moves extremities without difficulty  Other:    Medical Decision Making  Medically screening exam initiated at 8:56 PM.  Appropriate orders placed.  ALONDRIA MOUSSEAU was informed that the remainder of the evaluation will be completed by another provider, this initial triage assessment does not replace that evaluation, and the importance of remaining in the ED until their evaluation is complete.  X-ray to rule out bony fractures. Labs ordered.   Karie Kirks 09/04/20 2058    Dorie Rank, MD 09/05/20 [phone]

## 2020-09-04 NOTE — ED Triage Notes (Signed)
Pt c/o left foot/great toe pain, denies injury. Hx diabetes. Bilateral ankle swelling, pt reports that is her normal.

## 2020-09-05 ENCOUNTER — Other Ambulatory Visit: Payer: Self-pay

## 2020-09-05 ENCOUNTER — Ambulatory Visit: Payer: Medicare Other | Admitting: Podiatry

## 2020-09-05 DIAGNOSIS — E119 Type 2 diabetes mellitus without complications: Secondary | ICD-10-CM

## 2020-09-05 DIAGNOSIS — G629 Polyneuropathy, unspecified: Secondary | ICD-10-CM | POA: Diagnosis not present

## 2020-09-05 DIAGNOSIS — M722 Plantar fascial fibromatosis: Secondary | ICD-10-CM

## 2020-09-05 MED ORDER — GABAPENTIN 100 MG PO CAPS: 100.0000 mg | ORAL_CAPSULE | Freq: Every day | ORAL | 0 refills | Status: AC

## 2020-09-05 NOTE — Patient Instructions (Addendum)
Plantar Fasciitis (Heel Spur Syndrome) with Rehab The plantar fascia is a fibrous, ligament-like, soft-tissue structure that spans the bottom of the foot. Plantar fasciitis is a condition that causes pain in the foot due to inflammation of the tissue. SYMPTOMS   Pain and tenderness on the underneath side of the foot.  Pain that worsens with standing or walking. CAUSES  Plantar fasciitis is caused by irritation and injury to the plantar fascia on the underneath side of the foot. Common mechanisms of injury include:  Direct trauma to bottom of the foot.  Damage to a small nerve that runs under the foot where the main fascia attaches to the heel bone.  Stress placed on the plantar fascia due to bone spurs. RISK INCREASES WITH:   Activities that place stress on the plantar fascia (running, jumping, pivoting, or cutting).  Poor strength and flexibility.  Improperly fitted shoes.  Tight calf muscles.  Flat feet.  Failure to warm-up properly before activity.  Obesity. PREVENTION  Warm up and stretch properly before activity.  Allow for adequate recovery between workouts.  Maintain physical fitness:  Strength, flexibility, and endurance.  Cardiovascular fitness.  Maintain a health body weight.  Avoid stress on the plantar fascia.  Wear properly fitted shoes, including arch supports for individuals who have flat feet.  PROGNOSIS  If treated properly, then the symptoms of plantar fasciitis usually resolve without surgery. However, occasionally surgery is necessary.  RELATED COMPLICATIONS   Recurrent symptoms that may result in a chronic condition.  Problems of the lower back that are caused by compensating for the injury, such as limping.  Pain or weakness of the foot during push-off following surgery.  Chronic inflammation, scarring, and partial or complete fascia tear, occurring more often from repeated injections.  TREATMENT  Treatment initially involves the  use of ice and medication to help reduce pain and inflammation. The use of strengthening and stretching exercises may help reduce pain with activity, especially stretches of the Achilles tendon. These exercises may be performed at home or with a therapist. Your caregiver may recommend that you use heel cups of arch supports to help reduce stress on the plantar fascia. Occasionally, corticosteroid injections are given to reduce inflammation. If symptoms persist for greater than 6 months despite non-surgical (conservative), then surgery may be recommended.   MEDICATION   If pain medication is necessary, then nonsteroidal anti-inflammatory medications, such as aspirin and ibuprofen, or other minor pain relievers, such as acetaminophen, are often recommended.  Do not take pain medication within 7 days before surgery.  Prescription pain relievers may be given if deemed necessary by your caregiver. Use only as directed and only as much as you need.  Corticosteroid injections may be given by your caregiver. These injections should be reserved for the most serious cases, because they may only be given a certain number of times.  HEAT AND COLD  Cold treatment (icing) relieves pain and reduces inflammation. Cold treatment should be applied for 10 to 15 minutes every 2 to 3 hours for inflammation and pain and immediately after any activity that aggravates your symptoms. Use ice packs or massage the area with a piece of ice (ice massage).  Heat treatment may be used prior to performing the stretching and strengthening activities prescribed by your caregiver, physical therapist, or athletic trainer. Use a heat pack or soak the injury in warm water.  SEEK IMMEDIATE MEDICAL CARE IF:  Treatment seems to offer no benefit, or the condition worsens.  Any medications   produce adverse side effects.  EXERCISES- RANGE OF MOTION (ROM) AND STRETCHING EXERCISES - Plantar Fasciitis (Heel Spur Syndrome) These exercises  may help you when beginning to rehabilitate your injury. Your symptoms may resolve with or without further involvement from your physician, physical therapist or athletic trainer. While completing these exercises, remember:   Restoring tissue flexibility helps normal motion to return to the joints. This allows healthier, less painful movement and activity.  An effective stretch should be held for at least 30 seconds.  A stretch should never be painful. You should only feel a gentle lengthening or release in the stretched tissue.  RANGE OF MOTION - Toe Extension, Flexion  Sit with your right / left leg crossed over your opposite knee.  Grasp your toes and gently pull them back toward the top of your foot. You should feel a stretch on the bottom of your toes and/or foot.  Hold this stretch for 10 seconds.  Now, gently pull your toes toward the bottom of your foot. You should feel a stretch on the top of your toes and or foot.  Hold this stretch for 10 seconds. Repeat  times. Complete this stretch 3 times per day.   RANGE OF MOTION - Ankle Dorsiflexion, Active Assisted  Remove shoes and sit on a chair that is preferably not on a carpeted surface.  Place right / left foot under knee. Extend your opposite leg for support.  Keeping your heel down, slide your right / left foot back toward the chair until you feel a stretch at your ankle or calf. If you do not feel a stretch, slide your bottom forward to the edge of the chair, while still keeping your heel down.  Hold this stretch for 10 seconds. Repeat 3 times. Complete this stretch 2 times per day.   STRETCH  Gastroc, Standing  Place hands on wall.  Extend right / left leg, keeping the front knee somewhat bent.  Slightly point your toes inward on your back foot.  Keeping your right / left heel on the floor and your knee straight, shift your weight toward the wall, not allowing your back to arch.  You should feel a gentle stretch  in the right / left calf. Hold this position for 10 seconds. Repeat 3 times. Complete this stretch 2 times per day.  STRETCH  Soleus, Standing  Place hands on wall.  Extend right / left leg, keeping the other knee somewhat bent.  Slightly point your toes inward on your back foot.  Keep your right / left heel on the floor, bend your back knee, and slightly shift your weight over the back leg so that you feel a gentle stretch deep in your back calf.  Hold this position for 10 seconds. Repeat 3 times. Complete this stretch 2 times per day.  STRETCH  Gastrocsoleus, Standing  Note: This exercise can place a lot of stress on your foot and ankle. Please complete this exercise only if specifically instructed by your caregiver.   Place the ball of your right / left foot on a step, keeping your other foot firmly on the same step.  Hold on to the wall or a rail for balance.  Slowly lift your other foot, allowing your body weight to press your heel down over the edge of the step.  You should feel a stretch in your right / left calf.  Hold this position for 10 seconds.  Repeat this exercise with a slight bend in your right /   gain both the endurance and the strength needed for everyday activities through controlled exercises. Complete these exercises as instructed by your physician, physical therapist or athletic trainer. Progress the resistance and repetitions only as guided.  STRENGTH - Towel Curls Sit in a chair positioned on a non-carpeted surface. Place your foot on a towel, keeping your heel on the floor. Pull the towel toward your heel by only curling your toes. Keep your heel on the floor. Repeat 3 times.  Complete this exercise 2 times per day.  STRENGTH - Ankle Inversion Secure one end of a rubber exercise band/tubing to a fixed object (table, pole). Loop the other end around your foot just before your toes. Place your fists between your knees. This will focus your strengthening at your ankle. Slowly, pull your big toe up and in, making sure the band/tubing is positioned to resist the entire motion. Hold this position for 10 seconds. Have your muscles resist the band/tubing as it slowly pulls your foot back to the starting position. Repeat 3 times. Complete this exercises 2 times per day.  Document Released: 03/09/2005 Document Revised: 06/01/2011 Document Reviewed: 06/21/2008 [facility] Patient Information 2014 [place], [place].   Gabapentin Capsules or Tablets What is this medication? GABAPENTIN (GA ba pen tin) treats nerve pain. It may also be used to prevent and control seizures in people with epilepsy. It works by Production assistant, radio in Veterinary surgeon. This medicine may be used for other purposes; ask your health care provider orpharmacist if you have questions. COMMON BRAND NAME(S): Active-PAC with Gabapentin, Orpha Bur, Gralise, Neurontin What should I tell my care team before I take this medication? They need to know if you have any of these conditions: Alcohol or substance use disorder Kidney disease Lung or breathing disease Suicidal thoughts, plans, or attempt; a previous suicide attempt by you or a family member An unusual or allergic reaction to gabapentin, other medications, foods, dyes, or preservatives Pregnant or trying to get pregnant Breast-feeding How should I use this medication? Take this medication by mouth with a glass of water. Follow the directions on the prescription label. You can take it with or without food. If it upsets your stomach, take it with food. Take your medication at regular intervals. Do not take it more often than directed. Do not stop taking except  on your care team'sadvice. If you are directed to break the 600 or 800 mg tablets in half as part of your dose, the extra half tablet should be used for the next dose. If you have notused the extra half tablet within 28 days, it should be thrown away. A special MedGuide will be given to you by the pharmacist with eachprescription and refill. Be sure to read this information carefully each time. Talk to your care team about the use of this medication in children. While this medication may be prescribed for children as young as 3 years for selectedconditions, precautions do apply. Overdosage: If you think you have taken too much of this medicine contact apoison control center or emergency room at once. NOTE: This medicine is only for you. Do not share this medicine with others. What if I miss a dose? If you miss a dose, take it as soon as you can. If it is almost time for yournext dose, take only that dose. Do not take double or extra doses. What may interact with this medication? Alcohol Antihistamines for allergy, cough, and cold Certain medications for anxiety or sleep Certain medications for depression like amitriptyline, fluoxetine,  sertraline Certain medications for seizures like phenobarbital, primidone Certain medications for stomach problems General anesthetics like halothane, isoflurane, methoxyflurane, propofol Local anesthetics like lidocaine, pramoxine, tetracaine Medications that relax muscles for surgery Narcotic medications for pain Phenothiazines like chlorpromazine, mesoridazine, prochlorperazine, thioridazine This list may not describe all possible interactions. Give your health care provider a list of all the medicines, herbs, non-prescription drugs, or dietary supplements you use. Also tell them if you smoke, drink alcohol, or use illegaldrugs. Some items may interact with your medicine. What should I watch for while using this medication? Visit your care team for regular  checks on your progress. You may want to keep a record at home of how you feel your condition is responding to treatment. You may want to share this information with your care team at each visit. You should contact your care team if your seizures get worse or if you have any new types of seizures. Do not stop taking this medication or any of your seizure medications unless instructed by your care team. Stopping your medicationsuddenly can increase your seizures or their severity. This medication may cause serious skin reactions. They can happen weeks to months after starting the medication. Contact your care team right away if you notice fevers or flu-like symptoms with a rash. The rash may be red or purple and then turn into blisters or peeling of the skin. Or, you might notice a red rash with swelling of the face, lips or lymph nodes in your neck or under yourarms. Wear a medical identification bracelet or chain if you are taking thismedication for seizures. Carry a card that lists all your medications. You may get drowsy, dizzy, or have blurred vision. Do not drive, use machinery, or do anything that needs mental alertness until you know how this medication affects you. To reduce dizzy or fainting spells, do not sit or stand up quickly, especially if you are an older patient. Alcohol can increasedrowsiness and dizziness. Your mouth may get dry. Chewing sugarless gum or sucking hard candy, anddrinking plenty of water may help. Watch for new or worsening thoughts of suicide or depression. This includes sudden changes in mood, behaviors, or thoughts. These changes can happen at any time but are more common in the beginning of treatment or after a change in dose. Call your care team right away if you experience these thoughts orworsening depression. If you become pregnant while using this medication, you may enroll in the [place] Pregnancy Registry by calling [phone]. This  registry collects information about the safety of antiepileptic medication useduring pregnancy. What side effects may I notice from receiving this medication? Side effects that you should report to your care team as soon as possible: Allergic reactions or angioedema-skin rash, itching, hives, swelling of the face, eyes, lips, tongue, arms, or legs, trouble swallowing or breathing Rash, fever, and swollen lymph nodes Thoughts of suicide or self harm, worsening mood, feelings of depression Trouble breathing Unusual changes in mood or behavior in children after use such as difficulty concentrating, hostility, or restlessness Side effects that usually do not require medical attention (report to your careteam if they continue or are bothersome): Dizziness Drowsiness Nausea Swelling of ankles, feet, or hands Vomiting This list may not describe all possible side effects. Call your doctor for medical advice about side effects. You may report side effects to FDA at1-800-FDA-1088. Where should I keep my medication? Keep out of reach of children and pets. Store at room temperature between 15 and  30 degrees C (59 and 86 degrees F).Get rid of any unused medication after the expiration date. This medication may cause accidental overdose and death if taken by other adults, children, or pets. Mix any unused medication with a substance like cat litter or coffee grounds. Then throw the medication away in a sealed containerlike a sealed bag or a coffee can with a lid. NOTE: This sheet is a summary. It may not cover all possible information. If you have questions about this medicine, talk to your doctor, pharmacist, orhealth care provider.  2022 [employer] Standard (2020-01-24 12:16:18)

## 2020-09-05 NOTE — Discharge Instructions (Addendum)
Your history and exam today are suspicious for a cramp in your foot and toe causing the severe pain that started quickly and ended quickly.  Your x-ray shows no fracture or dislocation and your exam is not consistent with infection, gout, or other significant abnormality.  Given your resolution of symptoms, we feel you are safe for discharge home.  Please do some stretches of the toes as we discussed and follow-up with your podiatrist.  If any symptoms change or worsen acutely, please return to the nearest emergency department.

## 2020-09-05 NOTE — ED Notes (Signed)
Pt A&OX4, ambulatory at d/c with independent steady gait, NAD. Pt verbalized understanding of d/c instructions and follow up care.

## 2020-09-05 NOTE — ED Provider Notes (Signed)
[facility] EMERGENCY DEPARTMENT Provider Note   CSN: [id number] Arrival date & time: 09/04/20  2038     History Chief Complaint  Patient presents with   Foot Pain    [name] is a 70 y.o. female.  The history is provided by the patient and medical records. No language interpreter was used.  Foot Pain This is a new problem. The current episode started 1 to 2 hours ago. The problem occurs rarely. The problem has been resolved. Pertinent negatives include no chest pain, no abdominal pain, no headaches and no shortness of breath. Nothing aggravates the symptoms. Nothing relieves the symptoms. She has tried nothing for the symptoms. The treatment provided no relief.      Past Medical History:  Diagnosis Date   Anemia    Anxiety    Arthritis    DJD, lumbar spondylosis   Asthma    Back pain    Bilateral renal cysts    Breast cancer ([place])    left breast invasive ductal ca in remission as of 10/07/11 s/p double mastectomy   Breast cancer of upper-outer quadrant of left female breast ([place]) 01/15/2010   Per Dr. Laurelyn Sickle last clinic note 04/2012:  stage II a invasive ductal carcinoma of the left breast diagnosed in October 2011. She was found to have a ER/PR positive HER-2/neu positive breast cancer.She underwent bilateral mastectomies. Her right mastectomy was an elective prophylactic mastectomy due to her on comfort. Patient subsequently underwent adjuvant chemotherapy consisting of Taxotere carbop   Bronchitis 03/10/2012   Cataract    Chemotherapy follow-up examination 03/06/2011   Colon polyps    Cough 03/06/2011   Depression    seasonal melancholia   E. coli UTI    Edema extremities    GERD (gastroesophageal reflux disease)    History of migraine headaches    Hyperlipidemia    Hypertension    Ileitis    Kidney stones    non obstructive    Myopathy    not statin related, cause unknown     Patient Active Problem List   Diagnosis Date Noted    Dyspnea 04/30/2020   Panic attack 04/07/2020   Sacral pain 03/27/2020   Knee injury 03/27/2020   Melasma 12/02/2019   Tailor's bunion of both feet 12/02/2019   Mixed conductive and sensorineural hearing loss of both ears 10/06/2019   Bilateral hearing loss 07/31/2019   Deviated septum 07/31/2019   ETD (Eustachian tube dysfunction), bilateral 07/31/2019   Nasal turbinate hypertrophy 07/31/2019   Hearing loss of left ear 07/19/2019   Post-mastectomy pain 03/30/2019   Peripheral neuropathy 12/07/2018   Chronic rhinitis 46/56/8127   Nonalcoholic fatty liver disease 09/22/2017   Dry eye syndrome of both lacrimal glands 05/11/2017   Hyperplastic colon polyp 10/19/2016   Age-related nuclear cataract, bilateral 04/13/2016   Cortical age-related cataract, bilateral 04/13/2016   Hyperopia with presbyopia, bilateral 04/13/2016   Posterior vitreous detachment of right eye 04/13/2016   Type 2 diabetes mellitus without retinopathy ([place]) 04/13/2016   Chest pain, musculoskeletal 01/21/2016   Anemia of chronic disease 05/30/2015   Osteoarthritis of right knee 05/01/2015   Morbid obesity ([place]) 02/27/2015   Chronic diastolic heart failure ([place]) 12/04/2013   Dysuria 07/08/2013   Plantar fasciitis of left foot 03/03/2013   OSA (obstructive sleep apnea) 06/13/2012   Healthcare maintenance 03/26/2011   DM type 2 (diabetes mellitus, type 2) ([place]) 07/17/2010   Breast cancer of upper-outer quadrant of left female breast ([place]) 01/15/2010  GERD (gastroesophageal reflux disease) 10/15/2009   Cough variant asthma 02/09/2008   CKD (chronic kidney disease) stage 3, GFR 30-59 ml/min (HCC) 08/30/2006   Hyperlipidemia associated with type 2 diabetes mellitus (HCC) 02/22/2006   Hypertension associated with diabetes (HCC) 02/22/2006    Past Surgical History:  Procedure Laterality Date   ABDOMINAL HYSTERECTOMY     for fibroids    APPENDECTOMY     BREAST SURGERY  2011   Bilateral mastectomy   CYSTOSCOPY  WITH URETHRAL DILATATION N/A 12/26/2019   Procedure: CYSTOSCOPY WITH URETHRAL DILATATION;  Surgeon: Noel Christmas, MD;  Location: [facility];  Service: Urology;  Laterality: N/A;  30 MINS   INCONTINENCE SURGERY     LAMINECTOMY     C5-6 Dr. Otelia Sergeant 2005    MASTECTOMY     bilateral    OOPHORECTOMY     TONSILLECTOMY       OB History   No obstetric history on file.     Family History  Problem Relation Age of Onset   Heart disease Mother 79       CAD   Breast cancer Mother    Cancer Mother 54       Breast cancer, mother   Cancer Father 46       colon cancer dx'ed age 3   Diabetes Father    Coronary artery disease Other    Multiple sclerosis Daughter    Diabetes Brother    Diabetes Brother     Social History   Tobacco Use   Smoking status: Never   Smokeless tobacco: Never  Vaping Use   Vaping Use: Never used  Substance Use Topics   Alcohol use: No    Alcohol/week: 0.0 standard drinks   Drug use: No    Home Medications Prior to Admission medications   Medication Sig Start Date End Date Taking? Authorizing Provider  metFORMIN (GLUCOPHAGE-XR) 500 MG 24 hr tablet TAKE 2 TABLETS BY MOUTH  DAILY WITH BREAKFAST 07/22/20   Merrilyn Puma, MD  albuterol (PROAIR HFA) 108 (90 Base) MCG/ACT inhaler Inhale 1-2 puffs into the lungs every 6 (six) hours as needed for wheezing or shortness of breath. 04/30/20   Glenford Bayley, NP  amLODipine (NORVASC) 10 MG tablet Take 1 tablet (10 mg total) by mouth daily. 06/13/20   Bensimhon, Bevelyn Buckles, MD  Blood Glucose Calibration (TRUE METRIX LEVEL 1) Low SOLN Please use as directed 11/09/18   Seawell, Jaimie A, DO  Blood Glucose Monitoring Suppl (TRUE METRIX AIR GLUCOSE METER) DEVI 1 Device by Does not apply route 3 (three) times daily as needed. Check glucose up to three times per day as needed 11/07/18   Seawell, Jaimie A, DO  carvedilol (COREG) 25 MG tablet TAKE 1 TABLET BY MOUTH  TWICE DAILY WITH MEALS Patient taking differently: TAKE 1 TABLET BY  MOUTH  TWICE DAILY WITH MEALS 11/23/19   Roylene Reason, MD  cholecalciferol (VITAMIN D) 1000 UNITS tablet Take 1,000 Units by mouth daily.    [provider]  diclofenac Sodium (VOLTAREN) 1 % GEL APPLY 4 GRAMS TOPICALLY 4  TIMES DAILY AS NEEDED 08/16/20   Claudean Severance, MD  DULoxetine (CYMBALTA) 30 MG capsule Take 1 capsule (30 mg total) by mouth daily for 7 days, THEN 2 capsules (60 mg total) daily for 23 days. 06/07/20 07/07/20  Seawell, Jaimie A, DO  DULoxetine (CYMBALTA) 60 MG capsule Take 1 capsule (60 mg total) by mouth daily. 06/07/20   Seawell, Jaimie A, DO  fluticasone (FLONASE) 50 MCG/ACT nasal spray Place 2 sprays into both nostrils daily. 06/06/20   Maudie Mercury, MD  glucose blood (TRUE METRIX BLOOD GLUCOSE TEST) test strip The patient is insulin requiring, ICD 10 code E11.9. The patient tests 3 times per day. 11/07/18   Seawell, Jaimie A, DO  hydrALAZINE (APRESOLINE) 50 MG tablet Take 1.5 tablets (75 mg total) by mouth 3 (three) times daily. 06/13/20   Bensimhon, Shaune Pascal, MD  hydrocortisone (PROCTOZONE-HC) 2.5 % rectal cream Place 1 application rectally 2 (two) times daily. 03/13/20   Cato Mulligan, MD  loratadine (CLARITIN) 10 MG tablet Take 1 tablet (10 mg total) by mouth daily. 08/04/18 12/15/19  [name], MD  LORazepam (ATIVAN) 1 MG tablet Take 1 tablet (1 mg total) by mouth daily as needed for anxiety (Panic Attacks). 04/04/20   Marianna Payment, MD  Multiple Vitamin (MULTIVITAMIN) tablet Take 1 tablet by mouth daily.    [provider]  olmesartan (BENICAR) 40 MG tablet TAKE 1 TABLET BY MOUTH IN  THE EVENING 12/25/19   Lacinda Axon, MD  simvastatin (ZOCOR) 20 MG tablet Take 1 tablet (20 mg total) by mouth at bedtime. 05/23/20   Maudie Mercury, MD  traMADol (ULTRAM) 50 MG tablet Take 50 mg by mouth as needed.    [provider]  TRUEplus Lancets 33G MISC The patient is insulin requiring, ICD 10 code E11.9. The patient tests 3 times per day. 11/07/18    Seawell, Jaimie A, DO    Allergies    Atorvastatin, Hctz [hydrochlorothiazide], Norvasc [amlodipine besylate], Other, and Rosuvastatin  Review of Systems   Review of Systems  Constitutional:  Negative for chills, diaphoresis and fatigue.  HENT:  Negative for congestion.   Eyes:  Negative for visual disturbance.  Respiratory:  Negative for cough, chest tightness, shortness of breath and wheezing.   Cardiovascular:  Negative for chest pain, palpitations and leg swelling.  Gastrointestinal:  Negative for abdominal pain, constipation, diarrhea, nausea and vomiting.  Genitourinary:  Negative for dysuria, flank pain and frequency.  Musculoskeletal:  Negative for back pain, neck pain and neck stiffness.  Skin:  Negative for rash and wound.  Neurological:  Negative for dizziness, weakness, light-headedness and headaches.  Psychiatric/Behavioral:  Negative for agitation and confusion.   All other systems reviewed and are negative.  Physical Exam Updated Vital Signs BP (!) 169/88   Pulse 78   Temp 97.9 F (36.6 C) (Oral)   Resp 19   LMP 12/31/1968   SpO2 100%   Physical Exam Vitals and nursing note reviewed.  Constitutional:      General: She is not in acute distress.    Appearance: She is well-developed. She is not ill-appearing, toxic-appearing or diaphoretic.  HENT:     Head: Normocephalic and atraumatic.     Nose: No congestion or rhinorrhea.     Mouth/Throat:     Mouth: Mucous membranes are moist.     Pharynx: No oropharyngeal exudate or posterior oropharyngeal erythema.  Eyes:     Conjunctiva/sclera: Conjunctivae normal.  Cardiovascular:     Rate and Rhythm: Normal rate and regular rhythm.     Heart sounds: No murmur heard. Pulmonary:     Effort: Pulmonary effort is normal. No respiratory distress.     Breath sounds: Normal breath sounds. No wheezing, rhonchi or rales.  Chest:     Chest wall: No tenderness.  Abdominal:     Palpations: Abdomen is soft.     Tenderness:  There is no  abdominal tenderness. There is no guarding or rebound.  Musculoskeletal:        General: No tenderness.     Cervical back: Neck supple.     Right lower leg: No edema.     Left lower leg: No edema.  Feet:     Comments: No erythema, crepitance, tenderness, or abnormalities discovered on both feet.  No numbness appreciated on exam today. Skin:    General: Skin is warm and dry.     Capillary Refill: Capillary refill takes less than 2 seconds.     Findings: No erythema or rash.  Neurological:     General: No focal deficit present.     Mental Status: She is alert.     Cranial Nerves: No cranial nerve deficit.     Sensory: No sensory deficit.     Motor: No weakness.  Psychiatric:        Mood and Affect: Mood normal.    ED Results / Procedures / Treatments   Labs (all labs ordered are listed, but only abnormal results are displayed) Labs Reviewed  CBC WITH DIFFERENTIAL/PLATELET - Abnormal; Notable for the following components:      Result Value   RBC 3.78 (*)    Hemoglobin 10.8 (*)    HCT 33.2 (*)    All other components within normal limits  BASIC METABOLIC PANEL - Abnormal; Notable for the following components:   Glucose, Bld 177 (*)    Creatinine, Ser 1.33 (*)    GFR, Estimated 43 (*)    All other components within normal limits    EKG None  Radiology DG Toe Great Left  Result Date: 09/04/2020 CLINICAL DATA:  Toe pain EXAM: LEFT GREAT TOE COMPARISON:  None. FINDINGS: There is no evidence of fracture or dislocation. There is no evidence of arthropathy or other focal bone abnormality. Soft tissues are unremarkable. IMPRESSION: Negative. Electronically Signed   By: Ulyses Jarred M.D.   On: 09/04/2020 21:23    Procedures Procedures   Medications Ordered in ED Medications - No data to display  ED Course  I have reviewed the triage vital signs and the nursing notes.  Pertinent labs & imaging results that were available during my care of the patient were  reviewed by me and considered in my medical decision making (see chart for details).    MDM Rules/Calculators/A&P                          BIVIANA SADDLER is a 70 y.o. female with a past medical history significant for hypertension, asthma, hyperlipidemia, GERD, and migraines who presents with left great toe pain.  She reports that earlier today, she had some itching on her right toe and shortly thereafter started having significant pain in her left toe.  She reports that last around 1 hour and upon arrival to the emergency department completely resolved.  She reports it was a intense and cramping type pain in the toe but completely resolved instantly.  She denies any residual numbness, tingling, weakness.  There is no redness, no skin injury.  No bug bites.  She denies any trauma.  She does not take any medications for it.  No history of gout or any pain in the more proximal foot ankle or knee.  No other complaints reported.  On exam, normal sensation, range of motion, strength in toes and feet.  Normal pulses in lower extremities.  Mild edema which patient reports is unchanged  from baseline.  Exam otherwise unremarkable with clear breath sounds and nontender chest.  Clinically I suspect patient had a toe cramp that was very severe enough to bring her in but then totally resolved.  We discussed the stretches and she will follow-up with her podiatrist.  Based on her complete resolution, low suspicion for infection, gout, and her x-ray showed no fracture.  Labs were also overall reassuring and similar to prior.  Given resolution of symptoms and reassuring work-up, she will be discharged home.  Patient agreed with plan of care and was discharged in good condition.   Final Clinical Impression(s) / ED Diagnoses Final diagnoses:  Great toe pain, left    Rx / DC Orders ED Discharge Orders     None      Clinical Impression: 1. Great toe pain, left     Disposition: Discharge  Condition:  Good  I have discussed the results, Dx and Tx plan with the pt(& family if present). He/she/they expressed understanding and agree(s) with the plan. Discharge instructions discussed at great length. Strict return precautions discussed and pt &/or family have verbalized understanding of the instructions. No further questions at time of discharge.    New Prescriptions   No medications on file    Follow Up: your podiatrist        Avereigh Spainhower, Gwenyth Allegra, MD 09/05/20 [phone]

## 2020-09-06 ENCOUNTER — Encounter: Payer: Self-pay | Admitting: *Deleted

## 2020-09-09 ENCOUNTER — Encounter: Payer: Self-pay | Admitting: Internal Medicine

## 2020-09-09 ENCOUNTER — Ambulatory Visit (INDEPENDENT_AMBULATORY_CARE_PROVIDER_SITE_OTHER): Payer: Medicare Other | Admitting: Internal Medicine

## 2020-09-09 ENCOUNTER — Other Ambulatory Visit: Payer: Self-pay

## 2020-09-09 VITALS — BP 136/72 | HR 80 | Temp 98.7°F | Ht 68.0 in | Wt 237.4 lb

## 2020-09-09 DIAGNOSIS — F410 Panic disorder [episodic paroxysmal anxiety] without agoraphobia: Secondary | ICD-10-CM | POA: Diagnosis not present

## 2020-09-09 DIAGNOSIS — I152 Hypertension secondary to endocrine disorders: Secondary | ICD-10-CM

## 2020-09-09 DIAGNOSIS — E119 Type 2 diabetes mellitus without complications: Secondary | ICD-10-CM | POA: Diagnosis not present

## 2020-09-09 DIAGNOSIS — E1159 Type 2 diabetes mellitus with other circulatory complications: Secondary | ICD-10-CM | POA: Diagnosis not present

## 2020-09-09 LAB — POCT GLYCOSYLATED HEMOGLOBIN (HGB A1C): Hemoglobin A1C: 6.5 % — AB (ref 4.0–5.6)

## 2020-09-09 LAB — GLUCOSE, CAPILLARY: Glucose-Capillary: 122 mg/dL — ABNORMAL HIGH (ref 70–99)

## 2020-09-09 NOTE — Assessment & Plan Note (Signed)
A1c at 6.3 last visit repeat 6.5 today. [name] states that she is taking her metformin 1000 mg daily. She is not experiencing any side effects from the medication.  - Continue Metformin 1000 mg Daily - Recheck A1c in 6 months

## 2020-09-09 NOTE — Assessment & Plan Note (Addendum)
Vitals with BMI 09/09/2020 09/09/2020 09/05/2020  Height - 5\' 8"  -  Weight - 237 lbs 6 oz -  BMI - 31.7 -  Systolic 409 927 800  Diastolic 72 81 95  Pulse 80 79 71   Patient currently on Amlodipine 10 mg daily, Hydralazine 75 mg TID, olmesartan 40 mg QD. Her home meter is showing systolic pressures in the range of 120-151, overall averaging ~135. Will continue current regimen.  - Recheck vitals at visit with new PCP (Dr. Marianna Payment)

## 2020-09-09 NOTE — Patient Instructions (Signed)
To Ms. Dimaria,   It was a pleasure meeting you today! Today we discussed your diabetes medication, blood pressure, and recent foot pain.   For you diabetes, your kidney numbers are stable. We will collect blood to see your sugars today. Please continue using Metformin.   For your blood pressure, your readings are stable as well. Please continue taking your blood pressure medications.   For your foot pain, I am glad it has improved after seeing your Podiatrist. Please continue stretching and taking your gabapentin.   We will see you in months to meet your PCP.   Maudie Mercury, MD

## 2020-09-09 NOTE — Progress Notes (Signed)
   CC: 2 month follow up  HPI:  [name] is a 70 y.o. person, with a PMH noted below, who presents to the clinic for a 2 month follow up. To see the management of their acute and chronic conditions, please see the A&P note under the Encounters tab.   Past Medical History:  Diagnosis Date   Anemia    Anxiety    Arthritis    DJD, lumbar spondylosis   Asthma    Back pain    Bilateral renal cysts    Breast cancer ([place])    left breast invasive ductal ca in remission as of 10/07/11 s/p double mastectomy   Breast cancer of upper-outer quadrant of left female breast ([place]) 01/15/2010   Per Dr. Laurelyn Sickle last clinic note 04/2012:  stage II a invasive ductal carcinoma of the left breast diagnosed in October 2011. She was found to have a ER/PR positive HER-2/neu positive breast cancer.She underwent bilateral mastectomies. Her right mastectomy was an elective prophylactic mastectomy due to her on comfort. Patient subsequently underwent adjuvant chemotherapy consisting of Taxotere carbop   Bronchitis 03/10/2012   Cataract    Chemotherapy follow-up examination 03/06/2011   Colon polyps    Cough 03/06/2011   Depression    seasonal melancholia   E. coli UTI    Edema extremities    GERD (gastroesophageal reflux disease)    History of migraine headaches    Hyperlipidemia    Hypertension    Ileitis    Kidney stones    non obstructive    Myopathy    not statin related, cause unknown    Review of Systems:   Review of Systems  Constitutional:  Negative for chills, fever and weight loss.  Cardiovascular:  Negative for chest pain.  Gastrointestinal:  Negative for abdominal pain, blood in stool, constipation, diarrhea, nausea and vomiting.  Skin:  Negative for itching and rash.  Neurological:  Negative for dizziness and headaches.    Physical Exam:  Vitals:   09/09/20 1049 09/09/20 1056  BP: (!) 146/81 136/72  Pulse: 79 80  Temp: 98.7 F (37.1 C)   TempSrc: Oral   SpO2: 100%    Weight: 237 lb 6.4 oz (107.7 kg)   Height: 5' 8" (1.727 m)    Physical Exam Constitutional:      General: She is not in acute distress.    Appearance: Normal appearance. She is not ill-appearing, toxic-appearing or diaphoretic.  Cardiovascular:     Rate and Rhythm: Normal rate and regular rhythm.     Pulses: Normal pulses.     Heart sounds: Normal heart sounds. No murmur heard.   No friction rub. No gallop.  Pulmonary:     Effort: Pulmonary effort is normal.     Breath sounds: Normal breath sounds. No wheezing, rhonchi or rales.  Abdominal:     General: Abdomen is flat. Bowel sounds are normal.     Palpations: Abdomen is soft.  Neurological:     Mental Status: She is alert.  Psychiatric:        Mood and Affect: Mood normal.        Behavior: Behavior normal.     Assessment & Plan:   See Encounters Tab for problem based charting.  Patient discussed with Dr. Jimmye Norman

## 2020-09-09 NOTE — Assessment & Plan Note (Signed)
At the end of our office visit, Ms. Rhem voiced concern about her Ativan prescription. She states that she has been on Ativan previously for panic attacks. She states that the panic attacks aren't frequent and she requires a dose every 2-3 months. She has 8/10 pills from her prescription on her OV on 04/07/20. She does not need refills at this time, but wanted to bring it up at this visit. Discussed following up with PCP about the management of her anxiety medications.  - F/U in 2 months

## 2020-09-10 NOTE — Progress Notes (Signed)
Subjective:   Patient ID: [name], female   DOB: 70 y.o.   MRN: [id number]   HPI 70 year old female presents the office of present sharp pain going to her big toe and the arch of her foot.  She went to the emergency room last night.  She also has noticed a mass on the bottom of her left foot as well.  She is diabetic but she states her A1c is "under 7".  Denies recent injury.  No rating pain or weakness.   Review of Systems  All other systems reviewed and are negative.  Past Medical History:  Diagnosis Date   Anemia    Anxiety    Arthritis    DJD, lumbar spondylosis   Asthma    Back pain    Bilateral renal cysts    Breast cancer ([place])    left breast invasive ductal ca in remission as of 10/07/11 s/p double mastectomy   Breast cancer of upper-outer quadrant of left female breast ([place]) 01/15/2010   Per Dr. Laurelyn Sickle last clinic note 04/2012:  stage II a invasive ductal carcinoma of the left breast diagnosed in October 2011. She was found to have a ER/PR positive HER-2/neu positive breast cancer.She underwent bilateral mastectomies. Her right mastectomy was an elective prophylactic mastectomy due to her on comfort. Patient subsequently underwent adjuvant chemotherapy consisting of Taxotere carbop   Bronchitis 03/10/2012   Cataract    Chemotherapy follow-up examination 03/06/2011   Colon polyps    Cough 03/06/2011   Depression    seasonal melancholia   E. coli UTI    Edema extremities    GERD (gastroesophageal reflux disease)    History of migraine headaches    Hyperlipidemia    Hypertension    Ileitis    Kidney stones    non obstructive    Myopathy    not statin related, cause unknown     Past Surgical History:  Procedure Laterality Date   ABDOMINAL HYSTERECTOMY     for fibroids    APPENDECTOMY     BREAST SURGERY  2011   Bilateral mastectomy   CYSTOSCOPY WITH URETHRAL DILATATION N/A 12/26/2019   Procedure: CYSTOSCOPY WITH URETHRAL DILATATION;  Surgeon: Robley Fries, MD;  Location: [facility];  Service: Urology;  Laterality: N/A;  [place]     C5-6 Dr. Louanne Skye 2005    MASTECTOMY     bilateral    OOPHORECTOMY     TONSILLECTOMY       Current Outpatient Medications:    gabapentin (NEURONTIN) 100 MG capsule, Take 1 capsule (100 mg total) by mouth at bedtime., Disp: 90 capsule, Rfl: 0   metFORMIN (GLUCOPHAGE-XR) 500 MG 24 hr tablet, TAKE 2 TABLETS BY MOUTH  DAILY WITH BREAKFAST, Disp: 180 tablet, Rfl: 3   albuterol (PROAIR HFA) 108 (90 Base) MCG/ACT inhaler, Inhale 1-2 puffs into the lungs every 6 (six) hours as needed for wheezing or shortness of breath., Disp: 6.7 g, Rfl: 11   amLODipine (NORVASC) 10 MG tablet, Take 1 tablet (10 mg total) by mouth daily., Disp: 90 tablet, Rfl: 3   Blood Glucose Calibration (TRUE METRIX LEVEL 1) Low SOLN, Please use as directed, Disp: 3 each, Rfl: 0   Blood Glucose Monitoring Suppl (TRUE METRIX AIR GLUCOSE METER) DEVI, 1 Device by Does not apply route 3 (three) times daily as needed. Check glucose up to three times per day as needed, Disp: 1 Device, Rfl: 0  carvedilol (COREG) 25 MG tablet, TAKE 1 TABLET BY MOUTH  TWICE DAILY WITH MEALS (Patient taking differently: TAKE 1 TABLET BY MOUTH  TWICE DAILY WITH MEALS), Disp: 180 tablet, Rfl: 3   cholecalciferol (VITAMIN D) 1000 UNITS tablet, Take 1,000 Units by mouth daily., Disp: , Rfl:    diclofenac Sodium (VOLTAREN) 1 % GEL, APPLY 4 GRAMS TOPICALLY 4  TIMES DAILY AS NEEDED, Disp: 100 g, Rfl: 0   DULoxetine (CYMBALTA) 30 MG capsule, Take 1 capsule (30 mg total) by mouth daily for 7 days, THEN 2 capsules (60 mg total) daily for 23 days., Disp: 48 capsule, Rfl: 0   DULoxetine (CYMBALTA) 60 MG capsule, Take 1 capsule (60 mg total) by mouth daily., Disp: 30 capsule, Rfl: 5   fluticasone (FLONASE) 50 MCG/ACT nasal spray, Place 2 sprays into both nostrils daily., Disp: 11.1 mL, Rfl: 3   glucose blood (TRUE METRIX BLOOD GLUCOSE TEST) test  strip, The patient is insulin requiring, ICD 10 code E11.9. The patient tests 3 times per day., Disp: 100 each, Rfl: 12   hydrALAZINE (APRESOLINE) 50 MG tablet, Take 1.5 tablets (75 mg total) by mouth 3 (three) times daily., Disp: 405 tablet, Rfl: 3   hydrocortisone (PROCTOZONE-HC) 2.5 % rectal cream, Place 1 application rectally 2 (two) times daily., Disp: 30 g, Rfl: 0   loratadine (CLARITIN) 10 MG tablet, Take 1 tablet (10 mg total) by mouth daily., Disp: 90 tablet, Rfl: 3   LORazepam (ATIVAN) 1 MG tablet, Take 1 tablet (1 mg total) by mouth daily as needed for anxiety (Panic Attacks)., Disp: 10 tablet, Rfl: 0   Multiple Vitamin (MULTIVITAMIN) tablet, Take 1 tablet by mouth daily., Disp: , Rfl:    olmesartan (BENICAR) 40 MG tablet, TAKE 1 TABLET BY MOUTH IN  THE EVENING, Disp: 90 tablet, Rfl: 3   simvastatin (ZOCOR) 20 MG tablet, Take 1 tablet (20 mg total) by mouth at bedtime., Disp: 90 tablet, Rfl: 3   traMADol (ULTRAM) 50 MG tablet, Take 50 mg by mouth as needed., Disp: , Rfl:    TRUEplus Lancets 33G MISC, The patient is insulin requiring, ICD 10 code E11.9. The patient tests 3 times per day., Disp: 100 each, Rfl: 12  Allergies  Allergen Reactions   Atorvastatin Nausea Only    REACTION: Nausea, abdominal pain, and reflux. Pt has used the medication in the past and experienced the same reaction before the medication was discontinued.   Hctz [Hydrochlorothiazide]     Blurry vision   Norvasc [Amlodipine Besylate]     Blurry vision   Other Other (See Comments)   Rosuvastatin Itching          Objective:  Physical Exam  General: AAO x3, NAD  Dermatological: Skin is warm, dry and supple bilateral.  There are no open sores, no preulcerative lesions, no rash or signs of infection present.  Vascular: Dorsalis Pedis artery and Posterior Tibial artery pedal pulses are 2/4 bilateral with immedate capillary fill time.  There is no pain with calf compression, swelling, warmth, erythema.    Neruologic: Grossly intact via light touch bilateral.  Negative Tinel sign.  Musculoskeletal: On medial band plantar fascial the arch of the foot is a firm nonmobile soft tissue mass to the plantar fibroma.  Mild discomfort of the area.  No edema, erythema.  No other areas of discomfort identified today.  Muscular strength 5/5 in all groups tested bilateral.  Gait: Unassisted, Nonantalgic.       Assessment:   70 year old female with  plantar fibroma, concern for neuropathy     Plan:  -Treatment options discussed including all alternatives, risks, and complications -Etiology of symptoms were discussed -Reviewed the x-rays in the emergency department as well blood work.  I do think she may have some neuropathy and is already started 100 mg of gabapentin at nighttime.  Discussed side effects.  Also for the plantar fibroma we discussed exercises as well as Voltaren gel that she can rub in the area.  Consider steroid injection if needed.  [name] DPM

## 2020-09-12 DIAGNOSIS — N1832 Chronic kidney disease, stage 3b: Secondary | ICD-10-CM | POA: Diagnosis not present

## 2020-09-12 DIAGNOSIS — I129 Hypertensive chronic kidney disease with stage 1 through stage 4 chronic kidney disease, or unspecified chronic kidney disease: Secondary | ICD-10-CM | POA: Diagnosis not present

## 2020-09-12 DIAGNOSIS — D649 Anemia, unspecified: Secondary | ICD-10-CM | POA: Diagnosis not present

## 2020-09-12 DIAGNOSIS — N1330 Unspecified hydronephrosis: Secondary | ICD-10-CM | POA: Diagnosis not present

## 2020-09-12 DIAGNOSIS — R809 Proteinuria, unspecified: Secondary | ICD-10-CM | POA: Diagnosis not present

## 2020-09-12 DIAGNOSIS — R39198 Other difficulties with micturition: Secondary | ICD-10-CM | POA: Diagnosis not present

## 2020-09-12 DIAGNOSIS — E1122 Type 2 diabetes mellitus with diabetic chronic kidney disease: Secondary | ICD-10-CM | POA: Diagnosis not present

## 2020-09-30 DIAGNOSIS — C50912 Malignant neoplasm of unspecified site of left female breast: Secondary | ICD-10-CM | POA: Diagnosis not present

## 2020-09-30 DIAGNOSIS — C50911 Malignant neoplasm of unspecified site of right female breast: Secondary | ICD-10-CM | POA: Diagnosis not present

## 2020-10-02 DIAGNOSIS — Z7984 Long term (current) use of oral hypoglycemic drugs: Secondary | ICD-10-CM | POA: Diagnosis not present

## 2020-10-02 DIAGNOSIS — H43393 Other vitreous opacities, bilateral: Secondary | ICD-10-CM | POA: Diagnosis not present

## 2020-10-02 DIAGNOSIS — E119 Type 2 diabetes mellitus without complications: Secondary | ICD-10-CM | POA: Diagnosis not present

## 2020-10-02 DIAGNOSIS — H43813 Vitreous degeneration, bilateral: Secondary | ICD-10-CM | POA: Diagnosis not present

## 2020-10-02 DIAGNOSIS — H2513 Age-related nuclear cataract, bilateral: Secondary | ICD-10-CM | POA: Diagnosis not present

## 2020-10-02 DIAGNOSIS — H35363 Drusen (degenerative) of macula, bilateral: Secondary | ICD-10-CM | POA: Diagnosis not present

## 2020-10-02 DIAGNOSIS — H04123 Dry eye syndrome of bilateral lacrimal glands: Secondary | ICD-10-CM | POA: Diagnosis not present

## 2020-10-02 DIAGNOSIS — H524 Presbyopia: Secondary | ICD-10-CM | POA: Diagnosis not present

## 2020-10-02 DIAGNOSIS — H25013 Cortical age-related cataract, bilateral: Secondary | ICD-10-CM | POA: Diagnosis not present

## 2020-10-02 DIAGNOSIS — H52202 Unspecified astigmatism, left eye: Secondary | ICD-10-CM | POA: Diagnosis not present

## 2020-10-02 DIAGNOSIS — H5203 Hypermetropia, bilateral: Secondary | ICD-10-CM | POA: Diagnosis not present

## 2020-10-02 LAB — HM DIABETES EYE EXAM

## 2020-10-04 DIAGNOSIS — N1832 Chronic kidney disease, stage 3b: Secondary | ICD-10-CM | POA: Diagnosis not present

## 2020-10-14 ENCOUNTER — Other Ambulatory Visit: Payer: Self-pay | Admitting: Student

## 2020-10-14 DIAGNOSIS — I5032 Chronic diastolic (congestive) heart failure: Secondary | ICD-10-CM

## 2020-10-15 DIAGNOSIS — C50911 Malignant neoplasm of unspecified site of right female breast: Secondary | ICD-10-CM | POA: Diagnosis not present

## 2020-10-15 DIAGNOSIS — C50912 Malignant neoplasm of unspecified site of left female breast: Secondary | ICD-10-CM | POA: Diagnosis not present

## 2020-10-24 DIAGNOSIS — N2581 Secondary hyperparathyroidism of renal origin: Secondary | ICD-10-CM | POA: Diagnosis not present

## 2020-10-24 DIAGNOSIS — R531 Weakness: Secondary | ICD-10-CM | POA: Diagnosis not present

## 2020-10-24 DIAGNOSIS — I120 Hypertensive chronic kidney disease with stage 5 chronic kidney disease or end stage renal disease: Secondary | ICD-10-CM | POA: Diagnosis not present

## 2020-10-24 DIAGNOSIS — Z992 Dependence on renal dialysis: Secondary | ICD-10-CM | POA: Diagnosis not present

## 2020-10-24 DIAGNOSIS — U071 COVID-19: Secondary | ICD-10-CM | POA: Diagnosis not present

## 2020-10-24 DIAGNOSIS — D649 Anemia, unspecified: Secondary | ICD-10-CM | POA: Diagnosis not present

## 2020-10-24 DIAGNOSIS — N186 End stage renal disease: Secondary | ICD-10-CM | POA: Diagnosis not present

## 2020-10-25 DIAGNOSIS — N2581 Secondary hyperparathyroidism of renal origin: Secondary | ICD-10-CM | POA: Diagnosis not present

## 2020-10-25 DIAGNOSIS — D649 Anemia, unspecified: Secondary | ICD-10-CM | POA: Diagnosis not present

## 2020-10-25 DIAGNOSIS — R531 Weakness: Secondary | ICD-10-CM | POA: Diagnosis not present

## 2020-10-25 DIAGNOSIS — I120 Hypertensive chronic kidney disease with stage 5 chronic kidney disease or end stage renal disease: Secondary | ICD-10-CM | POA: Diagnosis not present

## 2020-10-25 DIAGNOSIS — N186 End stage renal disease: Secondary | ICD-10-CM | POA: Diagnosis not present

## 2020-10-25 DIAGNOSIS — Z992 Dependence on renal dialysis: Secondary | ICD-10-CM | POA: Diagnosis not present

## 2020-10-25 DIAGNOSIS — U071 COVID-19: Secondary | ICD-10-CM | POA: Diagnosis not present

## 2020-10-26 ENCOUNTER — Other Ambulatory Visit: Payer: Self-pay

## 2020-10-26 ENCOUNTER — Emergency Department (HOSPITAL_COMMUNITY)
Admission: EM | Admit: 2020-10-26 | Discharge: 2020-10-26 | Disposition: A | Discharge status: Home / Self Care | Payer: Medicare Other | Attending: Emergency Medicine | Admitting: Emergency Medicine

## 2020-10-26 ENCOUNTER — Encounter (HOSPITAL_COMMUNITY): Payer: Self-pay

## 2020-10-26 DIAGNOSIS — I120 Hypertensive chronic kidney disease with stage 5 chronic kidney disease or end stage renal disease: Secondary | ICD-10-CM | POA: Diagnosis not present

## 2020-10-26 DIAGNOSIS — U071 COVID-19: Secondary | ICD-10-CM | POA: Diagnosis not present

## 2020-10-26 DIAGNOSIS — E1122 Type 2 diabetes mellitus with diabetic chronic kidney disease: Secondary | ICD-10-CM | POA: Diagnosis not present

## 2020-10-26 DIAGNOSIS — H6992 Unspecified Eustachian tube disorder, left ear: Secondary | ICD-10-CM | POA: Diagnosis not present

## 2020-10-26 DIAGNOSIS — H9312 Tinnitus, left ear: Secondary | ICD-10-CM | POA: Diagnosis present

## 2020-10-26 DIAGNOSIS — E1140 Type 2 diabetes mellitus with diabetic neuropathy, unspecified: Secondary | ICD-10-CM | POA: Insufficient documentation

## 2020-10-26 DIAGNOSIS — N183 Chronic kidney disease, stage 3 unspecified: Secondary | ICD-10-CM | POA: Insufficient documentation

## 2020-10-26 DIAGNOSIS — Z992 Dependence on renal dialysis: Secondary | ICD-10-CM | POA: Diagnosis not present

## 2020-10-26 DIAGNOSIS — N186 End stage renal disease: Secondary | ICD-10-CM | POA: Diagnosis not present

## 2020-10-26 DIAGNOSIS — H6982 Other specified disorders of Eustachian tube, left ear: Secondary | ICD-10-CM | POA: Diagnosis not present

## 2020-10-26 DIAGNOSIS — I129 Hypertensive chronic kidney disease with stage 1 through stage 4 chronic kidney disease, or unspecified chronic kidney disease: Secondary | ICD-10-CM | POA: Insufficient documentation

## 2020-10-26 DIAGNOSIS — Z7984 Long term (current) use of oral hypoglycemic drugs: Secondary | ICD-10-CM | POA: Insufficient documentation

## 2020-10-26 DIAGNOSIS — Z853 Personal history of malignant neoplasm of breast: Secondary | ICD-10-CM | POA: Diagnosis not present

## 2020-10-26 DIAGNOSIS — Z79899 Other long term (current) drug therapy: Secondary | ICD-10-CM | POA: Diagnosis not present

## 2020-10-26 DIAGNOSIS — J45909 Unspecified asthma, uncomplicated: Secondary | ICD-10-CM | POA: Diagnosis not present

## 2020-10-26 DIAGNOSIS — N2581 Secondary hyperparathyroidism of renal origin: Secondary | ICD-10-CM | POA: Diagnosis not present

## 2020-10-26 MED ORDER — PREDNISONE 20 MG PO TABS: 40.0000 mg | ORAL_TABLET | Freq: Every day | ORAL | 0 refills | Status: AC

## 2020-10-26 NOTE — ED Provider Notes (Signed)
[facility] EMERGENCY DEPARTMENT Provider Note   CSN: [id number] Arrival date & time: 10/26/20  1207     History Chief Complaint  Patient presents with   Neck Pain   Ear Pain    [name] is a 70 y.o. female.  HPI Patient is a 70 year old female with a past medical history of breast cancer, asthma, anxiety that is presenting for a popping sensation in her neck.  She states for the last 2 weeks she has been hearing a popping noise when she looks to the left.  She states that the noise was annoying her so she wanted to be checked out in the ED today.  She denies any neck pain.  She has full range of motion of her neck.  She denies any numbness or tingling or weakness.  Patient is not complaining of ear pain. Patient denies any fevers, chills, headache, neck stiffness, chest pain, shortness of breath, nausea, vomiting, diarrhea, numbness or weakness. She has full range of motion in her neck. She has normal range of motion of all four extremities. She denies any midline spinal tenderness. She denies any urinary or bowel incontinence. She has a history of a bone spur in her neck.    Past Medical History:  Diagnosis Date   Anemia    Anxiety    Arthritis    DJD, lumbar spondylosis   Asthma    Back pain    Bilateral renal cysts    Breast cancer ([place])    left breast invasive ductal ca in remission as of 10/07/11 s/p double mastectomy   Breast cancer of upper-outer quadrant of left female breast ([place]) 01/15/2010   Per Dr. Laurelyn Sickle last clinic note 04/2012:  stage II a invasive ductal carcinoma of the left breast diagnosed in October 2011. She was found to have a ER/PR positive HER-2/neu positive breast cancer.She underwent bilateral mastectomies. Her right mastectomy was an elective prophylactic mastectomy due to her on comfort. Patient subsequently underwent adjuvant chemotherapy consisting of Taxotere carbop   Bronchitis 03/10/2012   Cataract    Chemotherapy follow-up  examination 03/06/2011   Colon polyps    Cough 03/06/2011   Depression    seasonal melancholia   E. coli UTI    Edema extremities    GERD (gastroesophageal reflux disease)    History of migraine headaches    Hyperlipidemia    Hypertension    Ileitis    Kidney stones    non obstructive    Myopathy    not statin related, cause unknown     Patient Active Problem List   Diagnosis Date Noted   Dyspnea 04/30/2020   Panic attack 04/07/2020   Sacral pain 03/27/2020   Knee injury 03/27/2020   Melasma 12/02/2019   Tailor's bunion of both feet 12/02/2019   Mixed conductive and sensorineural hearing loss of both ears 10/06/2019   Bilateral hearing loss 07/31/2019   Deviated septum 07/31/2019   ETD (Eustachian tube dysfunction), bilateral 07/31/2019   Nasal turbinate hypertrophy 07/31/2019   Hearing loss of left ear 07/19/2019   Post-mastectomy pain 03/30/2019   Peripheral neuropathy 12/07/2018   Chronic rhinitis 88/91/6945   Nonalcoholic fatty liver disease 09/22/2017   Dry eye syndrome of both lacrimal glands 05/11/2017   Hyperplastic colon polyp 10/19/2016   Age-related nuclear cataract, bilateral 04/13/2016   Cortical age-related cataract, bilateral 04/13/2016   Hyperopia with presbyopia, bilateral 04/13/2016   Posterior vitreous detachment of right eye 04/13/2016   Type 2 diabetes  mellitus without retinopathy ([place]) 04/13/2016   Chest pain, musculoskeletal 01/21/2016   Anemia of chronic disease 05/30/2015   Osteoarthritis of right knee 05/01/2015   Morbid obesity ([place]) 02/27/2015   Chronic diastolic heart failure ([place]) 12/04/2013   Dysuria 07/08/2013   Plantar fasciitis of left foot 03/03/2013   OSA (obstructive sleep apnea) 06/13/2012   Healthcare maintenance 03/26/2011   DM type 2 (diabetes mellitus, type 2) ([place]) 07/17/2010   Breast cancer of upper-outer quadrant of left female breast ([place]) 01/15/2010   GERD (gastroesophageal reflux disease) 10/15/2009   Cough variant  asthma 02/09/2008   CKD (chronic kidney disease) stage 3, GFR 30-59 ml/min ([place]) 08/30/2006   Hyperlipidemia associated with type 2 diabetes mellitus ([place]) 02/22/2006   Hypertension associated with diabetes ([place]) 02/22/2006    Past Surgical History:  Procedure Laterality Date   ABDOMINAL HYSTERECTOMY     for fibroids    APPENDECTOMY     BREAST SURGERY  2011   Bilateral mastectomy   CYSTOSCOPY WITH URETHRAL DILATATION N/A 12/26/2019   Procedure: CYSTOSCOPY WITH URETHRAL DILATATION;  Surgeon: Robley Fries, MD;  Location: [facility];  Service: Urology;  Laterality: N/A;  [place]     C5-6 Dr. Louanne Skye 2005    MASTECTOMY     bilateral    OOPHORECTOMY     TONSILLECTOMY       OB History   No obstetric history on file.     Family History  Problem Relation Age of Onset   Heart disease Mother 16       CAD   Breast cancer Mother    Cancer Mother 58       Breast cancer, mother   Cancer Father 63       colon cancer dx'ed age 30   Diabetes Father    Coronary artery disease Other    Multiple sclerosis Daughter    Diabetes Brother    Diabetes Brother     Social History   Tobacco Use   Smoking status: Never   Smokeless tobacco: Never  Vaping Use   Vaping Use: Never used  Substance Use Topics   Alcohol use: No    Alcohol/week: 0.0 standard drinks   Drug use: No    Home Medications Prior to Admission medications   Medication Sig Start Date End Date Taking? Authorizing Provider  albuterol (PROAIR HFA) 108 (90 Base) MCG/ACT inhaler Inhale 1-2 puffs into the lungs every 6 (six) hours as needed for wheezing or shortness of breath. 04/30/20  Yes Martyn Ehrich, NP  amLODipine (NORVASC) 5 MG tablet Take 5 mg by mouth daily.   Yes [provider]  Blood Glucose Monitoring Suppl (TRUE METRIX AIR GLUCOSE METER) DEVI 1 Device by Does not apply route 3 (three) times daily as needed. Check glucose up to three times per day as needed  11/07/18  Yes Seawell, Jaimie A, DO  carvedilol (COREG) 25 MG tablet TAKE 1 TABLET BY MOUTH  TWICE DAILY WITH MEALS Patient taking differently: Take 25 mg by mouth 2 (two) times daily with a meal. TAKE 1 TABLET BY MOUTH  TWICE DAILY WITH MEALS 10/14/20  Yes Maudie Mercury, MD  cholecalciferol (VITAMIN D) 1000 UNITS tablet Take 1,000 Units by mouth daily.   Yes [provider]  diclofenac Sodium (VOLTAREN) 1 % GEL APPLY 4 GRAMS TOPICALLY 4  TIMES DAILY AS NEEDED 08/16/20  Yes Asencion Noble, MD  fluticasone [facility]) 50 MCG/ACT  nasal spray Place 2 sprays into both nostrils daily. 06/06/20  Yes Maudie Mercury, MD  furosemide (LASIX) 20 MG tablet Take 20 mg by mouth every Monday, Wednesday, and Friday.   Yes [provider]  gabapentin (NEURONTIN) 100 MG capsule Take 1 capsule (100 mg total) by mouth at bedtime. 09/05/20  Yes [name], DPM  glucose blood (TRUE METRIX BLOOD GLUCOSE TEST) test strip The patient is insulin requiring, ICD 10 code E11.9. The patient tests 3 times per day. 11/07/18  Yes Seawell, Jaimie A, DO  hydrALAZINE (APRESOLINE) 50 MG tablet Take 1.5 tablets (75 mg total) by mouth 3 (three) times daily. 06/13/20  Yes Bensimhon, Shaune Pascal, MD  hydrocortisone (PROCTOZONE-HC) 2.5 % rectal cream Place 1 application rectally 2 (two) times daily. 03/13/20  Yes Cato Mulligan, MD  LORazepam (ATIVAN) 1 MG tablet Take 1 tablet (1 mg total) by mouth daily as needed for anxiety (Panic Attacks). 04/04/20  Yes Marianna Payment, MD  metFORMIN (GLUCOPHAGE-XR) 500 MG 24 hr tablet TAKE 2 TABLETS BY MOUTH  DAILY WITH BREAKFAST 07/22/20  Yes Virl Axe, MD  Multiple Vitamin (MULTIVITAMIN) tablet Take 1 tablet by mouth daily.   Yes [provider]  olmesartan (BENICAR) 40 MG tablet TAKE 1 TABLET BY MOUTH IN  THE EVENING Patient taking differently: Take 40 mg by mouth daily. 12/25/19  Yes Lacinda Axon, MD  predniSONE (DELTASONE) 20 MG tablet Take 2 tablets (40 mg  total) by mouth daily for 5 days. 10/26/20 10/31/20 Yes Doretha Sou, MD  simvastatin (ZOCOR) 20 MG tablet Take 1 tablet (20 mg total) by mouth at bedtime. 05/23/20  Yes Maudie Mercury, MD  traMADol (ULTRAM) 50 MG tablet Take 50 mg by mouth as needed.   Yes [provider]  TRUEplus Lancets 33G MISC The patient is insulin requiring, ICD 10 code E11.9. The patient tests 3 times per day. 11/07/18  Yes Seawell, Jaimie A, DO  DULoxetine (CYMBALTA) 60 MG capsule Take 1 capsule (60 mg total) by mouth daily. Patient not taking: Reported on 10/26/2020 06/07/20   Molli Hazard A, DO  loratadine (CLARITIN) 10 MG tablet Take 1 tablet (10 mg total) by mouth daily. 08/04/18 12/15/19  [name], MD    Allergies    Atorvastatin, Hctz [hydrochlorothiazide], Norvasc [amlodipine besylate], Other, and Rosuvastatin  Review of Systems   Review of Systems  Constitutional:  Negative for chills, diaphoresis and fatigue.  HENT:  Negative for congestion, dental problem, ear discharge, ear pain, facial swelling, hearing loss, nosebleeds, postnasal drip, rhinorrhea, sinus pain, sneezing, sore throat and trouble swallowing.   Eyes:  Negative for pain and visual disturbance.  Respiratory:  Negative for cough, chest tightness, shortness of breath, wheezing and stridor.   Cardiovascular:  Negative for palpitations and leg swelling.  Gastrointestinal:  Negative for abdominal distention, abdominal pain, blood in stool, constipation, diarrhea, nausea and vomiting.  Endocrine: Negative for polydipsia and polyuria.  Skin:  Negative for rash and wound.  Allergic/Immunologic: Negative for environmental allergies and food allergies.  Psychiatric/Behavioral:  Negative for agitation and behavioral problems.    Physical Exam Updated Vital Signs BP (!) 186/87   Pulse 88   Temp 98.1 F (36.7 C) (Oral)   Resp 16   LMP 12/31/1968   SpO2 100%   Physical Exam Vitals and nursing note reviewed.  Constitutional:      General:  She is not in acute distress.    Appearance: Normal appearance. She is normal weight. She is not ill-appearing.  HENT:     Head: Normocephalic and atraumatic.     Right Ear: Tympanic membrane, ear canal and external ear normal.     Left Ear: Tympanic membrane, ear canal and external ear normal.     Nose: Nose normal. No congestion.     Mouth/Throat:     Mouth: Mucous membranes are moist.     Pharynx: Oropharynx is clear. No oropharyngeal exudate or posterior oropharyngeal erythema.  Eyes:     General: No visual field deficit.    Extraocular Movements: Extraocular movements intact.     Conjunctiva/sclera: Conjunctivae normal.     Pupils: Pupils are equal, round, and reactive to light.  Neck:     Vascular: No carotid bruit or JVD.     Trachea: Trachea and phonation normal.  Cardiovascular:     Rate and Rhythm: Normal rate and regular rhythm.     Pulses: Normal pulses.     Heart sounds: Normal heart sounds. No murmur heard. Pulmonary:     Effort: Pulmonary effort is normal. No respiratory distress.     Breath sounds: Normal breath sounds. No stridor. No wheezing, rhonchi or rales.  Chest:     Chest wall: No tenderness.  Abdominal:     General: Bowel sounds are normal. There is no distension.     Palpations: Abdomen is soft.     Tenderness: There is no abdominal tenderness. There is no right CVA tenderness, left CVA tenderness, guarding or rebound.  Musculoskeletal:        General: No swelling or tenderness. Normal range of motion.     Cervical back: Full passive range of motion without pain, normal range of motion and neck supple. No edema, erythema, signs of trauma, rigidity, torticollis, tenderness, bony tenderness or crepitus. No pain with movement, spinous process tenderness or muscular tenderness. Normal range of motion.     Thoracic back: Normal. No tenderness or bony tenderness.     Lumbar back: Normal. No tenderness or bony tenderness.     Right lower leg: No edema.     Left  lower leg: No edema.  Lymphadenopathy:     Cervical: No cervical adenopathy.  Skin:    General: Skin is warm and dry.     Coloration: Skin is not jaundiced.  Neurological:     General: No focal deficit present.     Mental Status: She is alert and oriented to person, place, and time. Mental status is at baseline.     Cranial Nerves: Cranial nerves are intact. No cranial nerve deficit, dysarthria or facial asymmetry.     Sensory: Sensation is intact. No sensory deficit.     Motor: Motor function is intact. No weakness.     Coordination: Coordination is intact. Finger-Nose-Finger Test normal.     Gait: Gait is intact. Gait normal.  Psychiatric:        Mood and Affect: Mood normal.        Behavior: Behavior normal.        Thought Content: Thought content normal.        Judgment: Judgment normal.    ED Results / Procedures / Treatments   Labs (all labs ordered are listed, but only abnormal results are displayed) Labs Reviewed - No data to display  EKG None  Radiology No results found.  Procedures Procedures   Medications Ordered in ED Medications - No data to display  ED Course  I have reviewed the triage vital signs and the nursing notes.  Pertinent labs &  imaging results that were available during my care of the patient were reviewed by me and considered in my medical decision making (see chart for details).    MDM Rules/Calculators/A&P                         [name] is a 70 y.o. female ith past medical history of breast cancer, asthma, anxiety that is presenting for a popping sensation in her neck.  Patient is hemodynamically stable and in no acute distress.  Patient has full range of motion of her neck.  She has no carotid bruit, no JVD.  She has no midline spinal tenderness.  She has no signs of infection in her left ear.  Patient denies any numbness or weakness.  Patient has no fever, urinary incontinence, bowel incontinence.  Patient is able to ambulate  without complications. Her neurological exam is notable for no focal neuro deficits. Patient symptoms are most consistent with a eustachian tube dysfunction. Patient does not require any emergent labs or imaging.   Please take prednisone 40 mg daily for the next 5 days.  Patient is following up with her ENT physician on 8/16. Patient is going to follow up with PCP.  Patient states compliance and understanding of the plan. I explained labs and imaging to the patient. No further questions at this time from the patient.  The patient is safe and stable for discharge at this time with return precautions provided and a plan for follow-up care in place as needed  The plan for this patient was discussed with Dr. Darl Householder, who voiced agreement and who oversaw evaluation and treatment of this patient.   Final Clinical Impression(s) / ED Diagnoses Final diagnoses:  Dysfunction of left eustachian tube    Rx / DC Orders ED Discharge Orders          Ordered    predniSONE (DELTASONE) 20 MG tablet  Daily        10/26/20 1705             Doretha Sou, MD 10/27/20 0121    Drenda Freeze, MD 10/27/20 1452

## 2020-10-26 NOTE — Discharge Instructions (Addendum)
Please take prednisone 40 mg daily for the next 5 days.  Please follow-up with your ENT and PCP.

## 2020-10-26 NOTE — ED Notes (Signed)
Pt denies numbness and tingling. Pt able to move all extremities. Pt has full ROM of neck, but states she hears popping in her neck every time she turns it. Pt states the neck popping has been a month, but has gotten worse in the last 2 wks.

## 2020-10-26 NOTE — ED Triage Notes (Signed)
Patient states that she has this popping in the back of her head and neck when she turns her head for a while patient also thinks she has hurt her left ear drum from blowing her nose too much. Alert and oriented, aNAD

## 2020-11-01 DIAGNOSIS — I120 Hypertensive chronic kidney disease with stage 5 chronic kidney disease or end stage renal disease: Secondary | ICD-10-CM | POA: Diagnosis not present

## 2020-11-01 DIAGNOSIS — D649 Anemia, unspecified: Secondary | ICD-10-CM | POA: Diagnosis not present

## 2020-11-01 DIAGNOSIS — N2581 Secondary hyperparathyroidism of renal origin: Secondary | ICD-10-CM | POA: Diagnosis not present

## 2020-11-01 DIAGNOSIS — Z992 Dependence on renal dialysis: Secondary | ICD-10-CM | POA: Diagnosis not present

## 2020-11-01 DIAGNOSIS — R627 Adult failure to thrive: Secondary | ICD-10-CM | POA: Diagnosis not present

## 2020-11-01 DIAGNOSIS — N186 End stage renal disease: Secondary | ICD-10-CM | POA: Diagnosis not present

## 2020-11-04 DIAGNOSIS — H9312 Tinnitus, left ear: Secondary | ICD-10-CM | POA: Diagnosis not present

## 2020-11-04 DIAGNOSIS — H6983 Other specified disorders of Eustachian tube, bilateral: Secondary | ICD-10-CM | POA: Diagnosis not present

## 2020-11-04 DIAGNOSIS — J302 Other seasonal allergic rhinitis: Secondary | ICD-10-CM | POA: Diagnosis not present

## 2020-11-04 DIAGNOSIS — H906 Mixed conductive and sensorineural hearing loss, bilateral: Secondary | ICD-10-CM | POA: Diagnosis not present

## 2020-11-04 DIAGNOSIS — J342 Deviated nasal septum: Secondary | ICD-10-CM | POA: Diagnosis not present

## 2020-11-05 ENCOUNTER — Ambulatory Visit: Payer: Medicare Other | Admitting: Podiatry

## 2020-11-11 ENCOUNTER — Other Ambulatory Visit: Payer: Self-pay | Admitting: Internal Medicine

## 2020-11-11 DIAGNOSIS — J302 Other seasonal allergic rhinitis: Secondary | ICD-10-CM

## 2020-11-12 ENCOUNTER — Ambulatory Visit: Payer: Medicare Other | Admitting: Podiatry

## 2020-11-13 ENCOUNTER — Other Ambulatory Visit: Payer: Self-pay | Admitting: Internal Medicine

## 2020-11-13 DIAGNOSIS — J302 Other seasonal allergic rhinitis: Secondary | ICD-10-CM

## 2020-11-21 ENCOUNTER — Encounter: Payer: Self-pay | Admitting: Dietician

## 2020-12-03 ENCOUNTER — Ambulatory Visit: Payer: Medicare Other | Admitting: Podiatry

## 2020-12-04 ENCOUNTER — Other Ambulatory Visit: Payer: Self-pay | Admitting: *Deleted

## 2020-12-04 DIAGNOSIS — M25561 Pain in right knee: Secondary | ICD-10-CM

## 2020-12-04 MED ORDER — DICLOFENAC SODIUM 1 % EX GEL
CUTANEOUS | 0 refills | Status: DC
Start: 2020-12-04 — End: 2020-12-10

## 2020-12-05 ENCOUNTER — Encounter: Payer: Medicare Other | Admitting: Internal Medicine

## 2020-12-10 ENCOUNTER — Ambulatory Visit (INDEPENDENT_AMBULATORY_CARE_PROVIDER_SITE_OTHER): Payer: Medicare Other | Admitting: Internal Medicine

## 2020-12-10 ENCOUNTER — Encounter: Payer: Self-pay | Admitting: Internal Medicine

## 2020-12-10 ENCOUNTER — Other Ambulatory Visit: Payer: Self-pay

## 2020-12-10 VITALS — BP 128/76 | HR 74 | Temp 98.4°F | Resp 24 | Ht 68.0 in | Wt 232.9 lb

## 2020-12-10 DIAGNOSIS — E1159 Type 2 diabetes mellitus with other circulatory complications: Secondary | ICD-10-CM

## 2020-12-10 DIAGNOSIS — E119 Type 2 diabetes mellitus without complications: Secondary | ICD-10-CM

## 2020-12-10 DIAGNOSIS — I152 Hypertension secondary to endocrine disorders: Secondary | ICD-10-CM

## 2020-12-10 DIAGNOSIS — M25561 Pain in right knee: Secondary | ICD-10-CM

## 2020-12-10 DIAGNOSIS — F410 Panic disorder [episodic paroxysmal anxiety] without agoraphobia: Secondary | ICD-10-CM | POA: Diagnosis not present

## 2020-12-10 DIAGNOSIS — J310 Chronic rhinitis: Secondary | ICD-10-CM | POA: Diagnosis not present

## 2020-12-10 MED ORDER — LORAZEPAM 1 MG PO TABS: 1.0000 mg | ORAL_TABLET | Freq: Every day | ORAL | 0 refills | Status: DC | PRN

## 2020-12-10 MED ORDER — FUROSEMIDE 20 MG PO TABS: 20.0000 mg | ORAL_TABLET | ORAL | 3 refills | Status: DC

## 2020-12-10 MED ORDER — HYDRALAZINE HCL 25 MG PO TABS: 25.0000 mg | ORAL_TABLET | Freq: Three times a day (TID) | ORAL | 11 refills | Status: DC

## 2020-12-10 MED ORDER — DICLOFENAC SODIUM 1 % EX GEL: CUTANEOUS | 0 refills | Status: DC

## 2020-12-10 MED ORDER — HYDRALAZINE HCL 50 MG PO TABS: 75.0000 mg | ORAL_TABLET | Freq: Three times a day (TID) | ORAL | 3 refills | Status: DC

## 2020-12-10 NOTE — Assessment & Plan Note (Signed)
Ms. Tuller notes daily episodes of dizziness that often occur after she blows her nose hard and lasts for a few minutes. Orthostatic vitals today were normal. The dizziness is most likely a vaso-vagal response to increased backwards pressure from blowing her nose. We instructed her to try to be more gentle when she is blowing, and to consider using a neti pot with saline for nasal irrigation. She has a follow-up with Dr. Wilburn Cornelia, her ENT, on November 10.   Plan:  -Blow nose more gently and try nasal irrigation  -Continue Zyrtec and Fluticasone  -Follow up with Dr. Wilburn Cornelia on Nov 10

## 2020-12-10 NOTE — Assessment & Plan Note (Addendum)
Blood pressure today on recheck was 128/76, at goal. Will continue current regimen of amlodipine 10mg  daily, hydralazine 75mg  TID, and olmesartan 40mg  qd. She requests a change to her hydralazine prescription because she is currently given 50mg  tablets and is having trouble cutting them. We will write 2 separate prescriptions, 1 for 25mg  and 1 for 50mg , so she will no longer have to cut pills.   Plan:  Continue current regimen, changed hydralazine 75mg  TID to 2 Rxs so she will not have to cut pills  Follow-up with PCP Dr. Gilford Rile in January  Addendum: Unfortunately, insurance would not cover for 2 separate prescriptions for [name]'s hypertension.  We will have to order hydralazine 25 mg 3 tablets 3 times daily which will increase her pill burden, instead of one 50 mg tablet and 25 mg tablet 3 times daily. - 75 mg hydralazine (3 25 mg tablets) 3 times daily ordered

## 2020-12-10 NOTE — Progress Notes (Signed)
   HPI:  [name] is a 70 y.o. female with a history of hypertension, diabetes, breast cancer, chronic sinusitis, eustachian tube dysfunction, and anxiety who presents for follow-up. She states that overall she is doing well. She has not had to use lorazepam recently for panic attacks. She reports daily episodes of dizzy spells that last for a few minutes then resolve. When they occur, she feels light-headed and will have to hold on to something to maintain her balance. She notices that the dizziness tends to occur after she blows her nose hard. She has a follow-up with her ENT, Dr. Wilburn Leach, on November 10.   Physical Exam:  Vitals:   12/10/20 0957 12/10/20 1004  BP: (!) 148/79 128/76  Pulse: 77 74  Resp: (!) 24   Temp: 98.4 F (36.9 C)   TempSrc: Oral   SpO2: 100%   Weight: 232 lb 14.4 oz (105.6 kg)   Height: 5\' 8"  (1.727 m)    Gen: Pleasant, well-appearing.  HEENT: Bilateral turbinates swollen  CV: RRR, no murmurs heard  Pulm: Normal WOB, lungs CTAB   Assessment & Plan:   See Encounters Tab for problem based charting.  Patient discussed with Dr. Heber Leach  Problem List Items Addressed This Visit       Cardiovascular and Mediastinum   Hypertension associated with diabetes ([place])    Blood pressure today on recheck was 128/76, at goal. Will continue current regimen of amlodipine 10mg  daily, hydralazine 75mg  TID, and olmesartan 40mg  qd. She requests a change to her hydralazine prescription because she is currently given 50mg  tablets and is having trouble cutting them. We will write 2 separate prescriptions, 1 for 25mg  and 1 for 50mg , so she will no longer have to cut pills.   Plan:  Continue current regimen, changed hydralazine 75mg  TID to 2 Rxs so she will not have to cut pills  Follow-up with PCP Dr. Gilford Leach in January       Relevant Medications   furosemide (LASIX) 20 MG tablet (Start on 12/11/2020)   hydrALAZINE (APRESOLINE) 50 MG tablet   hydrALAZINE  (APRESOLINE) 25 MG tablet     Respiratory   Chronic rhinitis - Primary    [name] notes daily episodes of dizziness that often occur after she blows her nose hard and lasts for a few minutes. Orthostatic vitals today were normal. The dizziness is most likely a vaso-vagal response to increased backwards pressure from blowing her nose. We instructed her to try to be more gentle when she is blowing, and to consider using a neti pot with saline for nasal irrigation. She has a follow-up with Dr. Wilburn Leach, her ENT, on November 10.   Plan:  -Blow nose more gently and try nasal irrigation  -Continue Zyrtec and Fluticasone  -Follow up with Dr. Wilburn Leach on Nov 10       Other Visit Diagnoses     Right knee pain, unspecified chronicity       Relevant Medications   diclofenac Sodium (VOLTAREN) 1 % GEL   Panic attacks       Relevant Medications   LORazepam (ATIVAN) 1 MG tablet       Return in about 4 months (around 04/11/2021) for appointment with PCP, Dr. Gilford Leach .

## 2020-12-10 NOTE — Patient Instructions (Addendum)
Thank you, [name] for allowing [place] to provide your care today. Today we discussed your dizziness and refilled your medications.  Dizziness - We checked orthostatic vitals (blood pressure when lying down, sitting, and standing) to see if your medications may have been causing your dizziness, but your vitals were within the normal range. Most likely the dizziness that seems to come on when you blow your nose is due to back pressure through your nose and throat that goes down to your neck, triggering certain receptors that then make your heart think it needs to slow down. Try to not blow your nose so hard. You can also try using a neti pot and doing a nasal rinse to get rid of mucus. If you chose to do that, make sure you use saline water, not regular water, because regular water may contain bacteria that can harm you if it is squirted up your nose.   Medication refills - We sent in refills for the medications you requested. We changed your hydralazine to two separate prescriptions, one for 50 mg and the other for 25 mg, so you will still take 75 mg three times a day and not have to cut any pills.   I have ordered the following medication/changed the following medications:   Stop the following medications: Medications Discontinued During This Encounter  Medication Reason   LORazepam (ATIVAN) 1 MG tablet Reorder   hydrALAZINE (APRESOLINE) 50 MG tablet Reorder   furosemide (LASIX) 20 MG tablet Reorder   diclofenac Sodium (VOLTAREN) 1 % GEL Reorder     Start the following medications: Meds ordered this encounter  Medications   diclofenac Sodium (VOLTAREN) 1 % GEL    Sig: APPLY 4 GRAMS TOPICALLY 4  TIMES DAILY AS NEEDED    Dispense:  100 g    Refill:  0   furosemide (LASIX) 20 MG tablet    Sig: Take 1 tablet (20 mg total) by mouth every Monday, Wednesday, and Friday.    Dispense:  30 tablet    Refill:  3   hydrALAZINE (APRESOLINE) 50 MG tablet    Sig: Take 1.5 tablets (75 mg total)  by mouth 3 (three) times daily.    Dispense:  405 tablet    Refill:  3   hydrALAZINE (APRESOLINE) 25 MG tablet    Sig: Take 1 tablet (25 mg total) by mouth 3 (three) times daily.    Dispense:  120 tablet    Refill:  11   LORazepam (ATIVAN) 1 MG tablet    Sig: Take 1 tablet (1 mg total) by mouth daily as needed for anxiety (Panic Attacks).    Dispense:  10 tablet    Refill:  0      Follow up: 3-4 months    Remember: Try to be gentle when blowing your nose.   Should you have any questions or concerns please call the internal medicine clinic at [phone].    Maudie Mercury, MD  [place]

## 2020-12-11 ENCOUNTER — Telehealth: Payer: Self-pay | Admitting: *Deleted

## 2020-12-11 NOTE — Telephone Encounter (Signed)
Note from 12/10/2020 Office Visit below:   Blood pressure today on recheck was 128/76, at goal. Will continue current regimen of amlodipine 10mg  daily, hydralazine 75mg  TID, and olmesartan 40mg  qd. She requests a change to her hydralazine prescription because she is currently given 50mg  tablets and is having trouble cutting them. We will write 2 separate prescriptions, 1 for 25mg  and 1 for 50mg , so she will no longer have to cut pills.    Plan:  Continue current regimen, changed hydralazine 75mg  TID to 2 Rxs so she will not have to cut pills  Follow-up with PCP Dr. Gilford Rile in   Pharmacy called and requested rx be changed to Hydralazine 25mg  tabs -take 3 tabs TID  #810 (90 day supply) D/W pcp Patient in agreement  Will have pcp reflect dose change in the chart

## 2020-12-12 MED ORDER — HYDRALAZINE HCL 25 MG PO TABS: 75.0000 mg | ORAL_TABLET | Freq: Three times a day (TID) | ORAL | 3 refills | Status: DC

## 2020-12-12 NOTE — Addendum Note (Signed)
Addended by: Maudie Mercury C on: 12/12/2020 03:56 PM   Modules accepted: Orders

## 2020-12-13 NOTE — Progress Notes (Signed)
Internal Medicine Clinic Attending ? ?Case discussed with Dr. Winters  At the time of the visit.  We reviewed the resident?s history and exam and pertinent patient test results.  I agree with the assessment, diagnosis, and plan of care documented in the resident?s note.  ?

## 2020-12-30 ENCOUNTER — Ambulatory Visit: Payer: Medicare Other | Admitting: Podiatry

## 2020-12-30 ENCOUNTER — Other Ambulatory Visit: Payer: Self-pay

## 2020-12-30 DIAGNOSIS — E119 Type 2 diabetes mellitus without complications: Secondary | ICD-10-CM | POA: Diagnosis not present

## 2020-12-30 DIAGNOSIS — M722 Plantar fascial fibromatosis: Secondary | ICD-10-CM

## 2021-01-02 NOTE — Progress Notes (Signed)
Subjective: 70 year old female presents the office today for follow evaluation of pain to her feet, neuropathy.  She says overall she is been feeling well.  She does get some discomfort to the arch of the foot but otherwise she feels that she has been improving.  She stopped taking the gabapentin as she feel that she did not need it.  No injury or changes otherwise since I last saw her.  Objective: AAO x3, NAD DP/PT pulses palpable bilaterally, CRT less than 3 seconds There is still tenderness palpation along medial band plantar fashion the arch of the foot on the area of the plantar fibroma.  There is no other areas of discomfort.  There is no edema, erythema.  No area of pinpoint tenderness.  MMT 5/5. No pain with calf compression, swelling, warmth, erythema  Assessment: 70 year old female with plantar fibroma  Plan: -All treatment options discussed with the patient including all alternatives, risks, complications.  -She has not been using the Voltaren gel or any medication on the lesion, arch of the foot.  Recommend she can start with this and massage into it daily.  Discussed stretching exercises and shoes with arch support.  If needed we can do a steroid injection. -Patient encouraged to call the office with any questions, concerns, change in symptoms.   [name] DPM

## 2021-01-06 ENCOUNTER — Other Ambulatory Visit: Payer: Self-pay | Admitting: Internal Medicine

## 2021-01-06 DIAGNOSIS — E119 Type 2 diabetes mellitus without complications: Secondary | ICD-10-CM

## 2021-01-06 MED ORDER — ACCU-CHEK GUIDE W/DEVICE KIT: PACK | 1 refills | Status: DC

## 2021-01-06 MED ORDER — ACCU-CHEK SOFTCLIX LANCETS MISC: 11 refills | Status: DC

## 2021-01-06 MED ORDER — ACCU-CHEK GUIDE VI STRP: ORAL_STRIP | 12 refills | Status: AC

## 2021-01-06 NOTE — Telephone Encounter (Signed)
Butch Penny, there are 3 different options for the Prodigy meter. Can you set up the correct one for PCP to sign? Thank you!

## 2021-01-06 NOTE — Telephone Encounter (Signed)
Called Ms Lady because I am unsure if her insurance covers the Prodigy voice meter that she says she likes and used to use. She is Okay with a different meter. Will enter for doctors to sign.   She also asks about compression stockings and says Regino Schultze is working on it and she is willing to come in if need be. off

## 2021-01-06 NOTE — Telephone Encounter (Signed)
Refill Request- Pt states she was seen on 12/11/2020 and rec'd all of her medications but her Meter which is broken and she would like to have another one..   Prodigy-Meter   [id number] [address], [place] (Ph: [phone])

## 2021-01-12 ENCOUNTER — Other Ambulatory Visit: Payer: Self-pay | Admitting: Student

## 2021-01-12 DIAGNOSIS — R0789 Other chest pain: Secondary | ICD-10-CM

## 2021-02-15 ENCOUNTER — Other Ambulatory Visit: Payer: Self-pay | Admitting: Internal Medicine

## 2021-02-15 DIAGNOSIS — M25561 Pain in right knee: Secondary | ICD-10-CM

## 2021-02-15 DIAGNOSIS — F410 Panic disorder [episodic paroxysmal anxiety] without agoraphobia: Secondary | ICD-10-CM

## 2021-02-17 NOTE — Telephone Encounter (Signed)
Lorazepam was refilled as "Print". Please re-send rx electronically. Thanks

## 2021-02-17 NOTE — Addendum Note (Signed)
Addended by: Ebbie Latus on: 02/17/2021 03:56 PM   Modules accepted: Orders

## 2021-02-18 ENCOUNTER — Other Ambulatory Visit: Payer: Self-pay

## 2021-02-18 MED ORDER — ALBUTEROL SULFATE HFA 108 (90 BASE) MCG/ACT IN AERS: 1.0000 | INHALATION_SPRAY | Freq: Four times a day (QID) | RESPIRATORY_TRACT | 11 refills | Status: DC | PRN

## 2021-02-19 ENCOUNTER — Encounter: Payer: Self-pay | Admitting: Internal Medicine

## 2021-02-19 ENCOUNTER — Ambulatory Visit (INDEPENDENT_AMBULATORY_CARE_PROVIDER_SITE_OTHER): Payer: Medicare Other | Admitting: Internal Medicine

## 2021-02-19 ENCOUNTER — Other Ambulatory Visit: Payer: Self-pay

## 2021-02-19 VITALS — BP 139/73 | HR 85 | Temp 98.8°F | Ht 68.0 in | Wt 234.7 lb

## 2021-02-19 DIAGNOSIS — R42 Dizziness and giddiness: Secondary | ICD-10-CM | POA: Diagnosis not present

## 2021-02-19 DIAGNOSIS — H903 Sensorineural hearing loss, bilateral: Secondary | ICD-10-CM

## 2021-02-19 DIAGNOSIS — H9012 Conductive hearing loss, unilateral, left ear, with unrestricted hearing on the contralateral side: Secondary | ICD-10-CM | POA: Diagnosis not present

## 2021-02-19 DIAGNOSIS — H9312 Tinnitus, left ear: Secondary | ICD-10-CM | POA: Diagnosis not present

## 2021-02-19 MED ORDER — MECLIZINE HCL 25 MG PO TABS: 25.0000 mg | ORAL_TABLET | Freq: Three times a day (TID) | ORAL | 0 refills | Status: DC | PRN

## 2021-02-19 MED ORDER — LORAZEPAM 1 MG PO TABS: ORAL_TABLET | ORAL | 0 refills | Status: AC

## 2021-02-19 NOTE — Progress Notes (Signed)
Subjective:  CC: tinnitus and episodes of dizziness  HPI:  [name] is a 70 y.o. female with a past medical history stated below and presents today for tinnitus and episodes of dizziness. Please see problem based assessment and plan for additional details.  Past Medical History:  Diagnosis Date   Anemia    Anxiety    Arthritis    DJD, lumbar spondylosis   Asthma    Back pain    Bilateral renal cysts    Breast cancer ([place])    left breast invasive ductal ca in remission as of 10/07/11 s/p double mastectomy   Breast cancer of upper-outer quadrant of left female breast ([facility]) 01/15/2010   Per Dr. Laurelyn Sickle last clinic note 04/2012:  stage II a invasive ductal carcinoma of the left breast diagnosed in October 2011. She was found to have a ER/PR positive HER-2/neu positive breast cancer.She underwent bilateral mastectomies. Her right mastectomy was an elective prophylactic mastectomy due to her on comfort. Patient subsequently underwent adjuvant chemotherapy consisting of Taxotere carbop   Bronchitis 03/10/2012   Cataract    Chemotherapy follow-up examination 03/06/2011   Colon polyps    Cortical age-related cataract, bilateral 04/13/2016   Cough 03/06/2011   Depression    seasonal melancholia   E. coli UTI    Edema extremities    GERD (gastroesophageal reflux disease)    History of migraine headaches    Hyperlipidemia    Hypertension    Ileitis    Kidney stones    non obstructive    Myopathy    not statin related, cause unknown    Posterior vitreous detachment of right eye 04/13/2016    Current Outpatient Medications on File Prior to Visit  Medication Sig Dispense Refill   Accu-Chek Softclix Lancets lancets Check blood sugar up to 3 times per day. 100 each 11   albuterol (PROAIR HFA) 108 (90 Base) MCG/ACT inhaler Inhale 1-2 puffs into the lungs every 6 (six) hours as needed for wheezing or shortness of breath. 6.7 g 11   amLODipine (NORVASC) 5 MG tablet Take 5 mg by  mouth daily.     Blood Glucose Monitoring Suppl (ACCU-CHEK GUIDE) w/Device KIT Check blood sugar up to 3 times a day 1 kit 1   carvedilol (COREG) 25 MG tablet TAKE 1 TABLET BY MOUTH  TWICE DAILY WITH MEALS (Patient taking differently: Take 25 mg by mouth 2 (two) times daily with a meal. TAKE 1 TABLET BY MOUTH  TWICE DAILY WITH MEALS) 180 tablet 3   cholecalciferol (VITAMIN D) 1000 UNITS tablet Take 1,000 Units by mouth daily.     diclofenac Sodium (VOLTAREN) 1 % GEL APPLY 4 GRAMS TOPICALLY 4  TIMES DAILY AS NEEDED 100 g 0   DULoxetine (CYMBALTA) 60 MG capsule Take 1 capsule (60 mg total) by mouth daily. (Patient not taking: Reported on 10/26/2020) 30 capsule 5   fluticasone (FLONASE) 50 MCG/ACT nasal spray USE 2 SPRAYS IN BOTH  NOSTRILS DAILY 16 g 3   furosemide (LASIX) 20 MG tablet Take 1 tablet (20 mg total) by mouth every Monday, Wednesday, and Friday. 30 tablet 3   gabapentin (NEURONTIN) 100 MG capsule Take 1 capsule (100 mg total) by mouth at bedtime. 90 capsule 0   glucose blood (ACCU-CHEK GUIDE) test strip Check blood sugar 3 times per day 100 each 12   hydrALAZINE (APRESOLINE) 25 MG tablet Take 3 tablets (75 mg total) by mouth 3 (three) times daily. 810 tablet 3  hydrocortisone (PROCTOZONE-HC) 2.5 % rectal cream Place 1 application rectally 2 (two) times daily. 30 g 0   loratadine (CLARITIN) 10 MG tablet Take 1 tablet (10 mg total) by mouth daily. 90 tablet 3   LORazepam (ATIVAN) 1 MG tablet TAKE 1 TABLET BY MOUTH DAILY AS NEEDED FOR ANXIETY /PANIC ATTACKS 10 tablet 0   metFORMIN (GLUCOPHAGE-XR) 500 MG 24 hr tablet TAKE 2 TABLETS BY MOUTH  DAILY WITH BREAKFAST 180 tablet 3   Multiple Vitamin (MULTIVITAMIN) tablet Take 1 tablet by mouth daily.     olmesartan (BENICAR) 40 MG tablet TAKE 1 TABLET BY MOUTH IN  THE EVENING 90 tablet 3   simvastatin (ZOCOR) 20 MG tablet Take 1 tablet (20 mg total) by mouth at bedtime. 90 tablet 3   traMADol (ULTRAM) 50 MG tablet Take 50 mg by mouth as needed.      No current facility-administered medications on file prior to visit.    Family History  Problem Relation Age of Onset   Heart disease Mother 101       CAD   Breast cancer Mother    Cancer Mother 69       Breast cancer, mother   Cancer Father 24       colon cancer dx'ed age 78   Diabetes Father    Coronary artery disease Other    Multiple sclerosis Daughter    Diabetes Brother    Diabetes Brother     Social History   Socioeconomic History   Marital status: Single    Spouse name: Not on file   Number of children: Not on file   Years of education: Not on file   Highest education level: Not on file  Occupational History   Not on file  Tobacco Use   Smoking status: Never   Smokeless tobacco: Never  Vaping Use   Vaping Use: Never used  Substance and Sexual Activity   Alcohol use: No    Alcohol/week: 0.0 standard drinks   Drug use: No   Sexual activity: Not on file    Comment: Hysterectomy  Other Topics Concern   Not on file  Social History Narrative   Single, 2 kids   Never smoked   No alcohol use   No drugs   Laid off from [employer] after working there for 14 years.    Social Determinants of Health   Financial Resource Strain: Not on file  Food Insecurity: Not on file  Transportation Needs: Not on file  Physical Activity: Not on file  Stress: Not on file  Social Connections: Not on file  Intimate Partner Violence: Not on file    Review of Systems: ROS negative except for what is noted on the assessment and plan.  Objective:   Vitals:   02/19/21 1318  BP: 139/73  Pulse: 85  Temp: 98.8 F (37.1 C)  TempSrc: Oral  SpO2: 100%  Weight: 234 lb 11.2 oz (106.5 kg)  Height: 5' 8" (1.727 m)   Negative orthostatic vital signs  Physical Exam: Gen: A&O x3 and in no apparent distress, well appearing and nourished. HEENT:    Head - normocephalic, atraumatic.    Eye - visual acuity grossly intact, conjunctiva clear, sclera non-icteric, EOM  intact, no nystagmus with having patient keep head straight and follow finger with eyes.   Mouth - No obvious caries or periodontal disease. Neck: no masses or nodules, AROM intact. CV: RRR, no murmurs, S1/S2 presents  Resp: Clear to auscultation bilaterally  Abd: BS (+) x4, soft, non-tender abdomen, without hepatosplenomegaly or masses MSK: Grossly normal AROM and strength x4 extremities. Skin: good skin turgor, no rashes, unusual bruising, or prominent lesions.  Neuro: No focal deficits, grossly normal sensation and coordination.  Psych: Oriented x3 and responding appropriately. Intact memory, normal mood, judgement, affect, and insight.    Assessment & Plan:  See Encounters Tab for problem based charting.  Patient seen with Dr. Hansel Starling Masters, D.O. [facility]  PGY-1 Pager: [phone]  Phone: [phone] Date 02/20/2021  Time 7:53 AM

## 2021-02-19 NOTE — Patient Instructions (Addendum)
[name], it was a pleasure seeing you today!  Today we discussed: Ringing in ear Thank you for answering all of our questions today, I realize it can be challenging to describe what is happening to you.  I have referred you for a second opinion with another ENT and until then I would like you to try using a sound machine at night to help with ringing in ear.  Dizziness Please make sure you are drinking plenty of fluids.  Your blood pressure was normal when we checked it today so I do not think dizziness is related to that.  I have sent in a medication called Meclizine.  You can take this when you are dizzy to see if it helps resolve the spells.    I have ordered the following labs today:  Lab Orders  No laboratory test(s) ordered today     I will call if any are abnormal. All of your labs can be accessed through "My Chart"   My Chart Access: [email or web address]   Referrals ordered today:   Referral Orders         Ambulatory referral to ENT      I have ordered the following medication/changed the following medications:   Stop the following medications: There are no discontinued medications.   Start the following medications: Meds ordered this encounter  Medications   meclizine (ANTIVERT) 25 MG tablet    Sig: Take 1 tablet (25 mg total) by mouth 3 (three) times daily as needed for dizziness.    Dispense:  30 tablet    Refill:  0     Follow-up:  1 month for follow-up on diabetes and symptoms discussed today    Please make sure to arrive 15 minutes prior to your next appointment. If you arrive late, you may be asked to reschedule.   We look forward to seeing you next time. Please call our clinic at [phone] if you have any questions or concerns. The best time to call is Monday-Friday from 9am-4pm, but there is someone available 24/7. If after hours or the weekend, call the main hospital number and ask for the Internal  Medicine Resident On-Call. If you need medication refills, please notify your pharmacy one week in advance and they will send [place] a request.  Thank you for letting us take part in your care. Wishing you the best!  Thank you, Dr. [facility]

## 2021-02-20 DIAGNOSIS — H9312 Tinnitus, left ear: Secondary | ICD-10-CM | POA: Insufficient documentation

## 2021-02-20 NOTE — Assessment & Plan Note (Signed)
Patient presents with tinnitus of her left ear.  She states this is continuous, but most bothersome at night when her surroundings are quiet. When this happens she tries to distract herself with a game on her tablet, has not tried using a white noise machine. She has previously been evaluated by ENT (Dr. Wilburn Cornelia) in 2021 due to tinnitus and audiometric testing showed bilateral sensorineural hearing loss and left sided conductive hearing loss consistent with otosclerosis.  She was seen in 08/22 by Dr. Wilburn Cornelia with plans for repeat audiometric testing in 12/22, however patient became frustrated with continued symptoms and requested a second opinion.    Assessment: Patient presents with non-pulsatile tinnitus that is continuous.  Previous audiometric testing consistent with possible otosclerosis and bilateral sensorineural hearing loss.  Patient does have a history significant for noise exposure in prior workplace which could explain SN hearing loss. She also has symptoms of dizziness. Another differential is Meniere's disease.  She states that she has a nephew who had similar symptoms.  Plan: -referral to ENT for second opinion -encouraged use of white noise machine at night or when tinnitus is bothersome

## 2021-02-20 NOTE — Assessment & Plan Note (Addendum)
Patient presents with episodes of dizziness.  She describes this as a light-headed feeling that most often happens when she is standing up, it occasionally happens when seated as well.  When dizziness happens when she is seated she is unable to describe if it feels like room is spinning or if vision is affected.  She states that she feels like she is going to pass out when this happens.  She sits down and rests for several minutes and symptoms resolve.  This happens about 2 times weekly.    Orthostatic vitals signs negative.  Her physical exam showed no signs of nystagmus with having patient keep head straight ahead and follow finger with eyes.  Assessment: Her description of dizziness sounds most consistent with orthostatic hypotension. Patient states that she does not drink water because she does not like the taste of it.  Patient's description is less consistent with vertigo, but that is also on differential.  Plan: -encouraged patient to try drinking water with flavor packets in them that are sugar free or low in sugar. -trial of meclizine 25 mg prn up to TID

## 2021-03-04 NOTE — Progress Notes (Addendum)
Internal Medicine Clinic Attending  I saw and evaluated the patient.  I personally confirmed the key portions of the history and exam documented by Dr. Howie Ill and I reviewed pertinent patient test results.  The assessment, diagnosis, and plan were formulated together and I agree with the documentation in the residents note. The tinnitus was not pulsatile with heartbeat when she self-checked with her pulse.  No carotid bruits.

## 2021-03-06 ENCOUNTER — Telehealth: Payer: Self-pay

## 2021-03-06 NOTE — Telephone Encounter (Signed)
RTC to patient states has had sore throat since yesterday.  Has Chronic Sinus infections.  States gets every year.  Has been gargling with salt water with no relief. Cough and sinus drainage is yellowish in color.  No fevers.  States it makes her teeth sore as well.  Has gotten Prednisone in the past which helped.

## 2021-03-06 NOTE — Telephone Encounter (Signed)
Pt is requesting a call back ( tomorrow ) she is having a sore throat and drainage  she was given an appt with Dr Humphrey Rolls 12/ 19 tele health visit .. she is wanting to know if there is anything she can buy over the counter

## 2021-03-07 ENCOUNTER — Telehealth: Payer: Self-pay

## 2021-03-10 ENCOUNTER — Ambulatory Visit (INDEPENDENT_AMBULATORY_CARE_PROVIDER_SITE_OTHER): Payer: Medicare Other | Admitting: Internal Medicine

## 2021-03-10 DIAGNOSIS — J019 Acute sinusitis, unspecified: Secondary | ICD-10-CM | POA: Insufficient documentation

## 2021-03-10 DIAGNOSIS — J0191 Acute recurrent sinusitis, unspecified: Secondary | ICD-10-CM

## 2021-03-10 MED ORDER — PREDNISONE 20 MG PO TABS: 40.0000 mg | ORAL_TABLET | Freq: Every day | ORAL | 0 refills | Status: AC

## 2021-03-10 NOTE — Assessment & Plan Note (Addendum)
Assessment/Plan: Patient has relevant pmhx of chronic rhinitis, cough variant asthma, GERD, and deviated septum has URI symptoms consistent with acute sinusitis. DDx was viral vs bacterial sinusitis. Her symptoms especially cough with green sputum was concerning for bacterial sinusitis and abx was considered. Patient stated she gets these symptoms every year and they get cleared by prednisone. Discussed the benefits and risk of the therapies and patient chose to proceed with prednisone as she stated it had always alleviated her symptoms. She stated she would rather proceed with prednisone over the antibiotics.  -Prednisone 40 mg x 5 days -Continue symptomatic management with nasal saline irrigation, flonase, loratadine. -Instructions to reach out to the clinic if no improvement in 2 days for further evaluation -Instruction to discuss recurrent sinusitis episodes with ENT as pt has new new ENT provider

## 2021-03-10 NOTE — Progress Notes (Signed)
[facility] Internal Medicine Residency Telephone Encounter Continuity Care Appointment  HPI:  This telephone encounter was created for [name] on 03/10/2021 for the following purpose/cc URI symptoms.   Past Medical History:  Past Medical History:  Diagnosis Date   Anemia    Anxiety    Arthritis    DJD, lumbar spondylosis   Asthma    Back pain    Bilateral renal cysts    Breast cancer ([place])    left breast invasive ductal ca in remission as of 10/07/11 s/p double mastectomy   Breast cancer of upper-outer quadrant of left female breast ([place]) 01/15/2010   Per Dr. Laurelyn Sickle last clinic note 04/2012:  stage II a invasive ductal carcinoma of the left breast diagnosed in October 2011. She was found to have a ER/PR positive HER-2/neu positive breast cancer.She underwent bilateral mastectomies. Her right mastectomy was an elective prophylactic mastectomy due to her on comfort. Patient subsequently underwent adjuvant chemotherapy consisting of Taxotere carbop   Bronchitis 03/10/2012   Cataract    Chemotherapy follow-up examination 03/06/2011   Colon polyps    Cortical age-related cataract, bilateral 04/13/2016   Cough 03/06/2011   Depression    seasonal melancholia   E. coli UTI    Edema extremities    GERD (gastroesophageal reflux disease)    History of migraine headaches    Hyperlipidemia    Hypertension    Ileitis    Kidney stones    non obstructive    Myopathy    not statin related, cause unknown    Posterior vitreous detachment of right eye 04/13/2016     ROS:  Negative unless stated in the HPI   Assessment / Plan / Recommendations:  Please see A&P under problem oriented charting for assessment of the patient's acute and chronic medical conditions.  As always, pt is advised that if symptoms worsen or new symptoms arise, they should go to an urgent care facility or to to ER for further evaluation.   Consent and Medical Decision Making:  Patient seen with Dr.   This is a telephone encounter between [name] and Idamae Schuller on 03/10/2021 for URI symptoms. The visit was conducted with the patient located at home and Idamae Schuller at [facility]. The patient's identity was confirmed using their DOB and current address. The patient has consented to being evaluated through a telephone encounter and understands the associated risks (an examination cannot be done and the patient may need to come in for an appointment) / benefits (allows the patient to remain at home, decreasing exposure to coronavirus). I personally spent 25 minutes on medical discussion.

## 2021-03-12 NOTE — Progress Notes (Signed)
Internal Medicine Clinic Attending  I spoke with the patient and case discussed with Dr. Humphrey Rolls    At the time of the visit.  We reviewed the residents history and exam and pertinent patient test results.  I agree with the assessment, diagnosis, and plan of care documented in the residents note. Prednisone less ideal as treatment of recurrent sinusitis but she is adamant that she has had great benefit.  She does appear to be doing all of the other supportive measures and has seen ENT before as well as has a new ENT visit upcoming in march I believe.

## 2021-03-25 ENCOUNTER — Encounter: Payer: Medicare Other | Admitting: Internal Medicine

## 2021-04-01 ENCOUNTER — Other Ambulatory Visit: Payer: Self-pay

## 2021-04-01 ENCOUNTER — Inpatient Hospital Stay: Payer: Medicare Other | Attending: Hematology and Oncology | Admitting: Hematology and Oncology

## 2021-04-01 DIAGNOSIS — Z86718 Personal history of other venous thrombosis and embolism: Secondary | ICD-10-CM | POA: Diagnosis not present

## 2021-04-01 DIAGNOSIS — Z853 Personal history of malignant neoplasm of breast: Secondary | ICD-10-CM | POA: Diagnosis not present

## 2021-04-01 DIAGNOSIS — Z79811 Long term (current) use of aromatase inhibitors: Secondary | ICD-10-CM | POA: Diagnosis not present

## 2021-04-01 DIAGNOSIS — C50412 Malignant neoplasm of upper-outer quadrant of left female breast: Secondary | ICD-10-CM | POA: Diagnosis not present

## 2021-04-01 DIAGNOSIS — Z9221 Personal history of antineoplastic chemotherapy: Secondary | ICD-10-CM | POA: Insufficient documentation

## 2021-04-01 DIAGNOSIS — Z79899 Other long term (current) drug therapy: Secondary | ICD-10-CM | POA: Insufficient documentation

## 2021-04-01 DIAGNOSIS — Z8673 Personal history of transient ischemic attack (TIA), and cerebral infarction without residual deficits: Secondary | ICD-10-CM | POA: Insufficient documentation

## 2021-04-01 DIAGNOSIS — Z7984 Long term (current) use of oral hypoglycemic drugs: Secondary | ICD-10-CM | POA: Diagnosis not present

## 2021-04-01 DIAGNOSIS — Z9013 Acquired absence of bilateral breasts and nipples: Secondary | ICD-10-CM | POA: Diagnosis not present

## 2021-04-01 DIAGNOSIS — Z170 Estrogen receptor positive status [ER+]: Secondary | ICD-10-CM | POA: Diagnosis not present

## 2021-04-01 NOTE — Progress Notes (Signed)
Patient Care Team: Maudie Mercury, MD as PCP - General (Internal Medicine) Haroldine Laws, Shaune Pascal, MD as Referring Physician (Cardiology) Rutherford Guys, MD as Consulting Physician (Ophthalmology)  DIAGNOSIS:  Encounter Diagnosis  Name Primary?   Malignant neoplasm of upper-outer quadrant of left breast in female, estrogen receptor positive ([place])     SUMMARY OF ONCOLOGIC HISTORY: Oncology History  Breast cancer of upper-outer quadrant of left female breast ([place])  01/15/2010 Surgery    bilateral mastectomies: left breast IDC , ER/PR positive HER-2 positive  stage II a;  right breast benign   04/17/2010 - 04/17/2011 Chemotherapy   weekly Taxotere carboplatinum and Herceptin x 6 cycles  followed by Herceptin maintenance   09/01/2010 - 09/02/2015 Anti-estrogen oral therapy    letrozole 2.5 mg daily (BCI revealed 10%, high risk of recurrence but low probability of benefit from extended adjuvant therapy)    CHIEF COMPLIANT: Surveillance of breast cancer  INTERVAL HISTORY: [name] is a 71 year old with above-mentioned history laparoscopically with bilateral mastectomies and adjuvant chemotherapy followed by Herceptin maintenance.  She went on antiestrogen therapy and completed 5 years of therapy by 2017.  She is here for routine follow-up and reports no new problems or concerns.  Denies any pain lumps or nodules in the breast.   ALLERGIES:  is allergic to atorvastatin, hctz [hydrochlorothiazide], norvasc [amlodipine besylate], other, and rosuvastatin.  MEDICATIONS:  Current Outpatient Medications  Medication Sig Dispense Refill   Accu-Chek Softclix Lancets lancets Check blood sugar up to 3 times per day. 100 each 11   albuterol (PROAIR HFA) 108 (90 Base) MCG/ACT inhaler Inhale 1-2 puffs into the lungs every 6 (six) hours as needed for wheezing or shortness of breath. 6.7 g 11   amLODipine (NORVASC) 5 MG tablet Take 5 mg by mouth daily.     Blood Glucose Monitoring Suppl  (ACCU-CHEK GUIDE) w/Device KIT Check blood sugar up to 3 times a day 1 kit 1   carvedilol (COREG) 25 MG tablet TAKE 1 TABLET BY MOUTH  TWICE DAILY WITH MEALS (Patient taking differently: Take 25 mg by mouth 2 (two) times daily with a meal. TAKE 1 TABLET BY MOUTH  TWICE DAILY WITH MEALS) 180 tablet 3   cholecalciferol (VITAMIN D) 1000 UNITS tablet Take 1,000 Units by mouth daily.     diclofenac Sodium (VOLTAREN) 1 % GEL APPLY 4 GRAMS TOPICALLY 4  TIMES DAILY AS NEEDED 100 g 0   DULoxetine (CYMBALTA) 60 MG capsule Take 1 capsule (60 mg total) by mouth daily. (Patient not taking: Reported on 10/26/2020) 30 capsule 5   fluticasone (FLONASE) 50 MCG/ACT nasal spray USE 2 SPRAYS IN BOTH  NOSTRILS DAILY 16 g 3   furosemide (LASIX) 20 MG tablet Take 1 tablet (20 mg total) by mouth every Monday, Wednesday, and Friday. 30 tablet 3   gabapentin (NEURONTIN) 100 MG capsule Take 1 capsule (100 mg total) by mouth at bedtime. 90 capsule 0   glucose blood (ACCU-CHEK GUIDE) test strip Check blood sugar 3 times per day 100 each 12   hydrALAZINE (APRESOLINE) 25 MG tablet Take 3 tablets (75 mg total) by mouth 3 (three) times daily. 810 tablet 3   hydrocortisone (PROCTOZONE-HC) 2.5 % rectal cream Place 1 application rectally 2 (two) times daily. 30 g 0   loratadine (CLARITIN) 10 MG tablet Take 1 tablet (10 mg total) by mouth daily. 90 tablet 3   LORazepam (ATIVAN) 1 MG tablet TAKE 1 TABLET BY MOUTH DAILY AS NEEDED FOR ANXIETY /PANIC ATTACKS 10  tablet 0   meclizine (ANTIVERT) 25 MG tablet Take 1 tablet (25 mg total) by mouth 3 (three) times daily as needed for dizziness. 30 tablet 0   metFORMIN (GLUCOPHAGE-XR) 500 MG 24 hr tablet TAKE 2 TABLETS BY MOUTH  DAILY WITH BREAKFAST 180 tablet 3   Multiple Vitamin (MULTIVITAMIN) tablet Take 1 tablet by mouth daily.     olmesartan (BENICAR) 40 MG tablet TAKE 1 TABLET BY MOUTH IN  THE EVENING 90 tablet 3   simvastatin (ZOCOR) 20 MG tablet Take 1 tablet (20 mg total) by mouth at  bedtime. 90 tablet 3   traMADol (ULTRAM) 50 MG tablet Take 50 mg by mouth as needed.     No current facility-administered medications for this visit.    PHYSICAL EXAMINATION: ECOG PERFORMANCE STATUS: 1 - Symptomatic but completely ambulatory  There were no vitals filed for this visit. There were no vitals filed for this visit.  BREAST: No palpable masses or nodules in either right or left breasts. No palpable axillary supraclavicular or infraclavicular adenopathy no breast tenderness or nipple discharge. (exam performed in the presence of a chaperone)  LABORATORY DATA:  I have reviewed the data as listed CMP Latest Ref Rng & Units 09/04/2020 05/27/2020 04/30/2020  Glucose 70 - 99 mg/dL 177(H) 116(H) 104(H)  BUN 8 - 23 mg/dL _0 Creatinine 0.44 - 1.00 mg/dL 1.33(H) 1.52(H) 1.37(H)  Sodium 135 - 145 mmol/L 142 141 143  Potassium 3.5 - 5.1 mmol/L 4.0 4.1 4.0  Chloride 98 - 111 mmol/L 106 108 106  CO2 22 - 32 mmol/L _1 Calcium 8.9 - 10.3 mg/dL 9.0 9.9 9.5  Total Protein 6.5 - 8.1 g/dL - 7.5 -  Total Bilirubin 0.3 - 1.2 mg/dL - 0.5 -  Alkaline Phos 38 - 126 U/L - 64 -  AST 15 - 41 U/L - 23 -  ALT 0 - 44 U/L - 28 -   Lab Results  Component Value Date   WBC 5.4 09/04/2020   HGB 10.8 (L) 09/04/2020   HCT 33.2 (L) 09/04/2020   MCV 87.8 09/04/2020   PLT 220 09/04/2020   NEUTROABS 2.0 09/04/2020   ASSESSMENT & PLAN:  Breast cancer of upper-outer quadrant of left female breast ([place]) Left breast invasive ductal carcinoma stage II a ER/PR positive HER-2 positive status post bilateral mastectomies (Right CVA, Rt IJ DVT)  Followed by 6 cycles of TCH followed by Herceptin maintenance completed 04/17/2011  Letrozole June 2012-June 2017 BCI: High risk: 10% risk of late distant recurrence, no probability of benefit from extended adjuvant therapy.   Breast Cancer Surveillance: 1. Breast exam 04/01/2021: Benign 2.  No role of imaging studies since she had bilateral mastectomies 3.   Bone density 05/24/2019: normal T score 0.1  4.  Bone scan 05/12/2020: no evidence of metastatic disease to the bone. 5. MRI thoracic spine 05/03/2019: No evidence of metastatic disease.   Return to clinic in 1 year for follow-up  No orders of the defined types were placed in this encounter.  The patient has a good understanding of the overall plan. she agrees with it. she will call with any problems that may develop before the next visit here. Total time spent: 30 mins including face to face time and time spent for planning, charting and co-ordination of care   Harriette Ohara, MD 04/01/21

## 2021-04-01 NOTE — Assessment & Plan Note (Signed)
Left breast invasive ductal carcinoma stage II a ER/PR positive HER-2 positive status post bilateral mastectomies (Right CVA, Rt IJ DVT) Followed by 6 cycles of [place] followed by Herceptin maintenance completed 04/17/2011  Letrozole June 2012-June 2017 BCI: High risk: 10% risk of late distant recurrence, no probability of benefit from extended adjuvant therapy.  Breast Cancer Surveillance: 1. Breast exam1/12/2021:Benign 2. No role of imaging studies since she had bilateral mastectomies 3. Bone density 05/24/2019: normal T score0.1 4.Bone scan 05/12/2020: no evidence of metastatic disease to the bone. 5.MRI thoracic spine 05/03/2019: No evidence of metastatic disease.   Shortness of breath to minimal exertion, chest pain, fatigue: sees Dr. Haroldine Laws.  She might need stress test or additional cardiac evaluations.   Return to clinicin 1 year for follow-up

## 2021-04-02 DIAGNOSIS — N189 Chronic kidney disease, unspecified: Secondary | ICD-10-CM | POA: Diagnosis not present

## 2021-04-02 DIAGNOSIS — N1832 Chronic kidney disease, stage 3b: Secondary | ICD-10-CM | POA: Diagnosis not present

## 2021-04-08 ENCOUNTER — Ambulatory Visit (INDEPENDENT_AMBULATORY_CARE_PROVIDER_SITE_OTHER): Payer: Medicare Other | Admitting: Internal Medicine

## 2021-04-08 VITALS — BP 137/71 | HR 74 | Wt 234.4 lb

## 2021-04-08 DIAGNOSIS — E119 Type 2 diabetes mellitus without complications: Secondary | ICD-10-CM

## 2021-04-08 DIAGNOSIS — E785 Hyperlipidemia, unspecified: Secondary | ICD-10-CM | POA: Diagnosis not present

## 2021-04-08 DIAGNOSIS — E1169 Type 2 diabetes mellitus with other specified complication: Secondary | ICD-10-CM

## 2021-04-08 DIAGNOSIS — N183 Chronic kidney disease, stage 3 unspecified: Secondary | ICD-10-CM

## 2021-04-08 DIAGNOSIS — E1159 Type 2 diabetes mellitus with other circulatory complications: Secondary | ICD-10-CM

## 2021-04-08 DIAGNOSIS — I152 Hypertension secondary to endocrine disorders: Secondary | ICD-10-CM

## 2021-04-08 DIAGNOSIS — J310 Chronic rhinitis: Secondary | ICD-10-CM

## 2021-04-08 DIAGNOSIS — Z0000 Encounter for general adult medical examination without abnormal findings: Secondary | ICD-10-CM

## 2021-04-08 LAB — POCT GLYCOSYLATED HEMOGLOBIN (HGB A1C): Hemoglobin A1C: 6 % — AB (ref 4.0–5.6)

## 2021-04-08 LAB — GLUCOSE, CAPILLARY: Glucose-Capillary: 109 mg/dL — ABNORMAL HIGH (ref 70–99)

## 2021-04-08 NOTE — Assessment & Plan Note (Signed)
She will see ENT on Feb 13th for this.

## 2021-04-08 NOTE — Assessment & Plan Note (Addendum)
A1c today 6.0 with daily blood glucose log showing sugars in the 130s. She demonstrates excellent control on metformin and will plan to continue this.  Plan: - Foot exam today with no evidence of sensory defects or lesions. Dorsalis pedis pulses 2+ bilaterally. - A1c today 6.0 - Continue metformin 1000mg  daily

## 2021-04-08 NOTE — Assessment & Plan Note (Signed)
She reports obtaining both doses of Shingrix vaccine. 2nd dose was obtained at [facility].  - Foot exam completed today.

## 2021-04-08 NOTE — Assessment & Plan Note (Signed)
She reports her furosemide was stopped by her nephrologist due to concern for decreased kidney function. She has an appointment with them on Wednesday.  - No BMP today; will be obtained at nephrology appointment.

## 2021-04-08 NOTE — Patient Instructions (Addendum)
Thank you, [name] for allowing [place] to provide your care today. Today we discussed:  Type 2 Diabetes: - Your A1c was 6.0 today. Keep up the outstanding work!  Blood Pressure: - Your blood pressure looks good. Keep checking blood pressure at home  Cholesterol: - We will check a cholesterol level today and call you with the results.  I have ordered the following labs for you:  Lab Orders         Glucose, capillary         Lipid Profile         POC Hbg A1C        Follow up: 4 months   Remember:  Should you have any questions or concerns please call the internal medicine clinic at [phone]

## 2021-04-08 NOTE — Assessment & Plan Note (Signed)
Will check lipid profile at today's visit.  Plan: - Lipid panel

## 2021-04-08 NOTE — Assessment & Plan Note (Addendum)
Blood pressure well-controlled today at 137/71. She maintains a robust daily blood pressure log that shows similar measurements in the 130s.  Plan:  - Continue current regimen, 75mg  hydralazine (25mg  x 3) TID, amlodipine 10mg  daily, and olmesartan 40mg  qd

## 2021-04-08 NOTE — Progress Notes (Signed)
°  Subjective:     Patient ID: [name], female   DOB: 1951-01-18, 71 y.o.   MRN: [id number]  [name] is a 71 y/o with PMHx notable for hypertension, hyperlipidemia, T2DM, peripheral neuropathy, and CKD Stage 3 who presents to clinic for routine follow-up. Today she reports feeling well, and her only concern is an episode on Sat/Sun in which she was urinating very frequently accompanied by drinking lots of water. She says this has since resolved and that "things seem normal" at present. She says this does not feel like a UTI.  Additionally, she reports that she has an appointment on February 13th with her ENT to assess her hearing difficulty/chronic rhinitis, and an appointment Wednesday with her kidney doctor to assess her creatinine after stopping her furosemide.    Review of Systems  Cardiovascular:  Positive for leg swelling.  Endocrine: Positive for polydipsia.  Genitourinary:  Positive for frequency. Negative for dysuria.      Objective:   Physical Exam Constitutional:      Appearance: Normal appearance.  HENT:     Head: Normocephalic and atraumatic.  Cardiovascular:     Rate and Rhythm: Normal rate and regular rhythm.     Pulses:          Radial pulses are 2+ on the right side and 2+ on the left side.       Dorsalis pedis pulses are 2+ on the right side and 2+ on the left side.     Heart sounds: Normal heart sounds.  Pulmonary:     Effort: Pulmonary effort is normal. No respiratory distress.     Breath sounds: Normal breath sounds.  Abdominal:     General: Abdomen is flat. Bowel sounds are normal.     Palpations: Abdomen is soft.     Tenderness: There is no abdominal tenderness.  Musculoskeletal:     Right lower leg: Edema present.     Left lower leg: Edema present.     Right foot: Swelling present. No deformity, laceration or tenderness. Normal pulse.     Left foot: Swelling present. No deformity, laceration or tenderness. Normal pulse.  Skin:     General: Skin is warm and dry.  Neurological:     Mental Status: She is alert.       Assessment & Plan:     See problem-based charting

## 2021-04-09 LAB — LIPID PANEL
Chol/HDL Ratio: 3.1 ratio (ref 0.0–4.4)
Cholesterol, Total: 176 mg/dL (ref 100–199)
HDL: 56 mg/dL (ref >39)
LDL Chol Calc (NIH): 93 mg/dL (ref 0–99)
Triglycerides: 157 mg/dL — ABNORMAL HIGH (ref 0–149)
VLDL Cholesterol Cal: 27 mg/dL (ref 5–40)

## 2021-04-09 NOTE — Progress Notes (Signed)
Internal Medicine Clinic Attending ? ?Case discussed with Dr. Winters  At the time of the visit.  We reviewed the resident?s history and exam and pertinent patient test results.  I agree with the assessment, diagnosis, and plan of care documented in the resident?s note.  ?

## 2021-04-10 DIAGNOSIS — I129 Hypertensive chronic kidney disease with stage 1 through stage 4 chronic kidney disease, or unspecified chronic kidney disease: Secondary | ICD-10-CM | POA: Diagnosis not present

## 2021-04-10 DIAGNOSIS — R39198 Other difficulties with micturition: Secondary | ICD-10-CM | POA: Diagnosis not present

## 2021-04-10 DIAGNOSIS — N1832 Chronic kidney disease, stage 3b: Secondary | ICD-10-CM | POA: Diagnosis not present

## 2021-04-10 DIAGNOSIS — E1122 Type 2 diabetes mellitus with diabetic chronic kidney disease: Secondary | ICD-10-CM | POA: Diagnosis not present

## 2021-04-10 DIAGNOSIS — R809 Proteinuria, unspecified: Secondary | ICD-10-CM | POA: Diagnosis not present

## 2021-04-10 DIAGNOSIS — D631 Anemia in chronic kidney disease: Secondary | ICD-10-CM | POA: Diagnosis not present

## 2021-04-10 DIAGNOSIS — E1129 Type 2 diabetes mellitus with other diabetic kidney complication: Secondary | ICD-10-CM | POA: Diagnosis not present

## 2021-04-10 DIAGNOSIS — E559 Vitamin D deficiency, unspecified: Secondary | ICD-10-CM | POA: Diagnosis not present

## 2021-04-10 DIAGNOSIS — N2581 Secondary hyperparathyroidism of renal origin: Secondary | ICD-10-CM | POA: Diagnosis not present

## 2021-05-02 DIAGNOSIS — J343 Hypertrophy of nasal turbinates: Secondary | ICD-10-CM | POA: Diagnosis not present

## 2021-05-02 DIAGNOSIS — H8002 Otosclerosis involving oval window, nonobliterative, left ear: Secondary | ICD-10-CM | POA: Diagnosis not present

## 2021-05-02 DIAGNOSIS — H9012 Conductive hearing loss, unilateral, left ear, with unrestricted hearing on the contralateral side: Secondary | ICD-10-CM | POA: Diagnosis not present

## 2021-05-02 DIAGNOSIS — H9312 Tinnitus, left ear: Secondary | ICD-10-CM | POA: Diagnosis not present

## 2021-05-02 DIAGNOSIS — J342 Deviated nasal septum: Secondary | ICD-10-CM | POA: Diagnosis not present

## 2021-05-02 DIAGNOSIS — J310 Chronic rhinitis: Secondary | ICD-10-CM | POA: Diagnosis not present

## 2021-05-08 ENCOUNTER — Ambulatory Visit: Payer: Medicare Other | Admitting: Pulmonary Disease

## 2021-05-13 DIAGNOSIS — Z853 Personal history of malignant neoplasm of breast: Secondary | ICD-10-CM | POA: Diagnosis not present

## 2021-05-20 DIAGNOSIS — C50912 Malignant neoplasm of unspecified site of left female breast: Secondary | ICD-10-CM | POA: Diagnosis not present

## 2021-05-22 ENCOUNTER — Ambulatory Visit (INDEPENDENT_AMBULATORY_CARE_PROVIDER_SITE_OTHER): Payer: Medicare Other | Admitting: Internal Medicine

## 2021-05-22 ENCOUNTER — Encounter: Payer: Self-pay | Admitting: Internal Medicine

## 2021-05-22 VITALS — BP 127/80 | HR 85 | Ht 68.0 in | Wt 233.3 lb

## 2021-05-22 DIAGNOSIS — E1159 Type 2 diabetes mellitus with other circulatory complications: Secondary | ICD-10-CM | POA: Diagnosis not present

## 2021-05-22 DIAGNOSIS — R0789 Other chest pain: Secondary | ICD-10-CM | POA: Diagnosis not present

## 2021-05-22 DIAGNOSIS — I152 Hypertension secondary to endocrine disorders: Secondary | ICD-10-CM

## 2021-05-22 DIAGNOSIS — J302 Other seasonal allergic rhinitis: Secondary | ICD-10-CM | POA: Diagnosis not present

## 2021-05-22 DIAGNOSIS — E119 Type 2 diabetes mellitus without complications: Secondary | ICD-10-CM

## 2021-05-22 DIAGNOSIS — H6983 Other specified disorders of Eustachian tube, bilateral: Secondary | ICD-10-CM

## 2021-05-22 DIAGNOSIS — I5032 Chronic diastolic (congestive) heart failure: Secondary | ICD-10-CM

## 2021-05-22 DIAGNOSIS — E1169 Type 2 diabetes mellitus with other specified complication: Secondary | ICD-10-CM

## 2021-05-22 DIAGNOSIS — E785 Hyperlipidemia, unspecified: Secondary | ICD-10-CM

## 2021-05-22 DIAGNOSIS — M79652 Pain in left thigh: Secondary | ICD-10-CM

## 2021-05-22 MED ORDER — SIMVASTATIN 20 MG PO TABS: 20.0000 mg | ORAL_TABLET | Freq: Every day | ORAL | 3 refills | Status: DC

## 2021-05-22 MED ORDER — FLUTICASONE PROPIONATE 50 MCG/ACT NA SUSP: NASAL | 3 refills | Status: DC

## 2021-05-22 MED ORDER — FUROSEMIDE 20 MG PO TABS: 20.0000 mg | ORAL_TABLET | ORAL | 3 refills | Status: DC

## 2021-05-22 MED ORDER — OLMESARTAN MEDOXOMIL 40 MG PO TABS: 40.0000 mg | ORAL_TABLET | Freq: Every evening | ORAL | 3 refills | Status: AC

## 2021-05-22 MED ORDER — METFORMIN HCL ER 500 MG PO TB24: 1000.0000 mg | ORAL_TABLET | Freq: Every day | ORAL | 3 refills | Status: DC

## 2021-05-22 MED ORDER — CARVEDILOL 25 MG PO TABS: 25.0000 mg | ORAL_TABLET | Freq: Two times a day (BID) | ORAL | 3 refills | Status: DC

## 2021-05-22 NOTE — Patient Instructions (Signed)
Ms.Priscille C Roppolo, it was a pleasure seeing you today! ? ?Today we discussed: ?Left thigh pain- ?You likely have a muscle strain in your left leg.  Please try taking tylenol for the next couple day to see if it gets better. This can take up to 4 weeks to heal, if you have pain after this please call the office and we will want to get some additional ? ?I have ordered the following labs today: ? ?Lab Orders  ?No laboratory test(s) ordered today  ?  ? ?I will call if any are abnormal. All of your labs can be accessed through "My Chart" ?  ?My Chart Access: ?[email or web address] ? ?Tests ordered today: ? ?none ? ?Referrals ordered today:  ? ?Referral Orders  ?No referral(s) requested today  ?  ? ?I have ordered the following medication/changed the following medications:  ? ?Stop the following medications: ?There are no discontinued medications.  ? ?Start the following medications: ?No orders of the defined types were placed in this encounter. ?  ? ?Follow-up: 3 months  ? ?Please make sure to arrive 15 minutes prior to your next appointment. If you arrive late, you may be asked to reschedule.  ? ?We look forward to seeing you next time. Please call our clinic at [phone] if you have any questions or concerns. The best time to call is Monday-Friday from 9am-4pm, but there is someone available 24/7. If after hours or the weekend, call the main hospital number and ask for the Internal Medicine Resident On-Call. If you need medication refills, please notify your pharmacy one week in advance and they will send [place] a request. ? ?Thank you for letting us take part in your care. Wishing you the best! ? ?Thank you, ?Dr. Howie Ill ?[place]  ?

## 2021-05-22 NOTE — Progress Notes (Signed)
? ? ?Subjective:  ?CC: thigh pain, medication refills ? ?HPI: ? ?[name] is a 71 y.o. female with a past medical history stated below and presents today for thigh pain and medication refills. Please see problem based assessment and plan for additional details. ? ?Past Medical History:  ?Diagnosis Date  ? Anemia   ? Anxiety   ? Arthritis   ? DJD, lumbar spondylosis  ? Asthma   ? Back pain   ? Bilateral renal cysts   ? Breast cancer ([place])   ? left breast invasive ductal ca in remission as of 10/07/11 s/p double mastectomy  ? Breast cancer of upper-outer quadrant of left female breast ([place]) 01/15/2010  ? Per Dr. Laurelyn Sickle last clinic note 04/2012:  stage II a invasive ductal carcinoma of the left breast diagnosed in October 2011. She was found to have a ER/PR positive HER-2/neu positive breast cancer.She underwent bilateral mastectomies. Her right mastectomy was an elective prophylactic mastectomy due to her on comfort. Patient subsequently underwent adjuvant chemotherapy consisting of Taxotere carbop  ? Bronchitis 03/10/2012  ? Cataract   ? Chemotherapy follow-up examination 03/06/2011  ? Colon polyps   ? Cortical age-related cataract, bilateral 04/13/2016  ? Cough 03/06/2011  ? Depression   ? seasonal melancholia  ? E. coli UTI   ? Edema extremities   ? GERD (gastroesophageal reflux disease)   ? History of migraine headaches   ? Hyperlipidemia   ? Hypertension   ? Ileitis   ? Kidney stones   ? non obstructive   ? Myopathy   ? not statin related, cause unknown   ? Posterior vitreous detachment of right eye 04/13/2016  ? ? ?Current Outpatient Medications on File Prior to Visit  ?Medication Sig Dispense Refill  ? Accu-Chek Softclix Lancets lancets Check blood sugar up to 3 times per day. 100 each 11  ? albuterol (PROAIR HFA) 108 (90 Base) MCG/ACT inhaler Inhale 1-2 puffs into the lungs every 6 (six) hours as needed for wheezing or shortness of breath. 6.7 g 11  ? Blood Glucose Monitoring Suppl (ACCU-CHEK GUIDE)  w/Device KIT Check blood sugar up to 3 times a day 1 kit 1  ? cholecalciferol (VITAMIN D) 1000 UNITS tablet Take 1,000 Units by mouth daily.    ? diclofenac Sodium (VOLTAREN) 1 % GEL APPLY 4 GRAMS TOPICALLY 4  TIMES DAILY AS NEEDED 100 g 0  ? gabapentin (NEURONTIN) 100 MG capsule Take 1 capsule (100 mg total) by mouth at bedtime. 90 capsule 0  ? glucose blood (ACCU-CHEK GUIDE) test strip Check blood sugar 3 times per day 100 each 12  ? hydrALAZINE (APRESOLINE) 25 MG tablet Take 3 tablets (75 mg total) by mouth 3 (three) times daily. 810 tablet 3  ? hydrocortisone (PROCTOZONE-HC) 2.5 % rectal cream Place 1 application rectally 2 (two) times daily. 30 g 0  ? loratadine (CLARITIN) 10 MG tablet Take 1 tablet (10 mg total) by mouth daily. 90 tablet 3  ? LORazepam (ATIVAN) 1 MG tablet TAKE 1 TABLET BY MOUTH DAILY AS NEEDED FOR ANXIETY /PANIC ATTACKS 10 tablet 0  ? meclizine (ANTIVERT) 25 MG tablet Take 1 tablet (25 mg total) by mouth 3 (three) times daily as needed for dizziness. 30 tablet 0  ? Multiple Vitamin (MULTIVITAMIN) tablet Take 1 tablet by mouth daily.    ? traMADol (ULTRAM) 50 MG tablet Take 50 mg by mouth as needed.    ? ?No current facility-administered medications on file prior to visit.  ? ? ?  Family History  ?Problem Relation Age of Onset  ? Heart disease Mother 46  ?     CAD  ? Breast cancer Mother   ? Cancer Mother 27  ?     Breast cancer, mother  ? Cancer Father 46  ?     colon cancer dx'ed age 57  ? Diabetes Father   ? Coronary artery disease Other   ? Multiple sclerosis Daughter   ? Diabetes Brother   ? Diabetes Brother   ? ? ?Social History  ? ?Socioeconomic History  ? Marital status: Single  ?  Spouse name: Not on file  ? Number of children: Not on file  ? Years of education: Not on file  ? Highest education level: Not on file  ?Occupational History  ? Not on file  ?Tobacco Use  ? Smoking status: Never  ? Smokeless tobacco: Never  ?Vaping Use  ? Vaping Use: Never used  ?Substance and Sexual Activity   ? Alcohol use: No  ?  Alcohol/week: 0.0 standard drinks  ? Drug use: No  ? Sexual activity: Not on file  ?  Comment: Hysterectomy  ?Other Topics Concern  ? Not on file  ?Social History Narrative  ? Single, 2 kids  ? Never smoked  ? No alcohol use  ? No drugs  ? Laid off from [employer] after working there for 14 years.   ? ?Social Determinants of Health  ? ?Financial Resource Strain: Not on file  ?Food Insecurity: Not on file  ?Transportation Needs: Not on file  ?Physical Activity: Not on file  ?Stress: Not on file  ?Social Connections: Not on file  ?Intimate Partner Violence: Not on file  ? ? ?Review of Systems: ?ROS negative except for what is noted on the assessment and plan. ? ?Objective:  ? ?Vitals:  ? 05/22/21 1028  ?BP: 127/80  ?Pulse: 85  ?SpO2: 99%  ?Weight: 233 lb 4.8 oz (105.8 kg)  ?Height: $RemoveB'5\' 8"'IbYFxlpQ$  (1.727 m)  ? ? ?Physical Exam: ?Gen: A&O x3 and in no apparent distress, well appearing and nourished. ?HEENT:  ?  Eye - visual acuity grossly intact, conjunctiva clear, sclera non-icteric, EOM intact.  ?  Mouth - No obvious caries or periodontal disease. ?Neck: no masses or nodules, AROM intact. ?CV: RRR, no murmurs, S1/S2 presents  ?Resp: Clear to ascultation bilaterally  ?Abd: BS (+) x4, soft, non-tender abdomen, without hepatosplenomegaly or masses ?MSK: point tenderness over adductus longus and sartorius. 5/5 muscle strength in hip extension/ flexion and knee extension/ flexion. She has soreness in same area with hip adduction and abduction. No erythema or swelling at site.  ?Skin: good skin turgor, no rashes, unusual bruising, or prominent lesions.  ?Neuro: No focal deficits, grossly normal sensation and coordination.  ?Psych: Oriented x3 and responding appropriately. Intact memory, normal mood, judgement, affect, and insight.  ? ? ?Assessment & Plan:  ?See Encounters Tab for problem based charting. ? ?Patient discussed with Dr. Evette Doffing ? ? ?Christiana Fuchs, D.O. ?[facility]   PGY-1 ?Pager: [phone]  Phone: [phone] ?Date 05/23/2021  Time 10:19 AM  ? ?

## 2021-05-23 DIAGNOSIS — M79652 Pain in left thigh: Secondary | ICD-10-CM | POA: Insufficient documentation

## 2021-05-23 MED ORDER — FUROSEMIDE 20 MG PO TABS: 20.0000 mg | ORAL_TABLET | Freq: Every day | ORAL | 3 refills | Status: DC | PRN

## 2021-05-23 NOTE — Assessment & Plan Note (Addendum)
Lipid panel checked 01/23. LDL within goal for primary prevention. ?-refilled simvastatin 20 mg ?

## 2021-05-23 NOTE — Assessment & Plan Note (Signed)
hgb a1c 6 1/23 ?-refilled metformin ?

## 2021-05-23 NOTE — Assessment & Plan Note (Addendum)
Blood pressure well controlled.  Patient request refill of olmesartan 40 mg and coreg 25 mg BID. She states that she has not been taking hydral or amlodipine.  ? ?-refilled olmesartan and coreg ?-updated medication list ?

## 2021-05-23 NOTE — Assessment & Plan Note (Signed)
Echo in 2022 with EF 60 to 65%. ?Patient previously taking lasix 20 mg MWF. Instructed to change to PRN at recent nephology visit. ?-refilled lasix 20 mg PRN ?

## 2021-05-23 NOTE — Assessment & Plan Note (Signed)
Follows with ENT. ?-refilled flonase ?

## 2021-05-23 NOTE — Assessment & Plan Note (Addendum)
hgb a1c 6 1/23 ?-refilled metformin 1000 mg ?

## 2021-05-23 NOTE — Assessment & Plan Note (Signed)
Presents due to pain in left thigh.  She states that started about a week ago.  She describes the pain as tenderness to the medial portion of her thigh.  She noticed this whenever she started a new exercise class.  She has not tried Tylenol (history of CKD, no NSAIDs) is able to ambulate despite pain.  History of travel, she does have a remote history of breast cancer s/p bilateral mastectomies and chemo. No family or personal history of DVT.  ?On exam, patient has point tenderness over adductus longus and sartorius. 5/5 muscle strength in hip extension/ flexion and knee extension/ flexion. She has soreness in same area with hip adduction and abduction. No erythema or swelling at site.  ?A/P: ?She is likely experiencing a muscle strain. Talked with patient that if she continues to have pain in 4 weeks to return to clinic. Wells score is low, DVT unlikely. Patient does have a history of stage 1A breast cancer that was treated and she has been following up with oncology routinely. Will consider imaging of femur if pain continues after 4 weeks. ?

## 2021-05-26 NOTE — Progress Notes (Signed)
Internal Medicine Clinic Attending  Case discussed with Dr. Masters  At the time of the visit.  We reviewed the resident's history and exam and pertinent patient test results.  I agree with the assessment, diagnosis, and plan of care documented in the resident's note.  

## 2021-07-11 ENCOUNTER — Telehealth (HOSPITAL_COMMUNITY): Payer: Self-pay | Admitting: Vascular Surgery

## 2021-07-11 NOTE — Telephone Encounter (Signed)
Lvm to make pt f/u w/ db w/ echo, next ava  ?

## 2021-07-15 ENCOUNTER — Telehealth: Payer: Self-pay | Admitting: *Deleted

## 2021-07-15 NOTE — Chronic Care Management (AMB) (Signed)
?  Care Management  ? ?Outreach Note ? ?07/15/2021 ?Name: [name] MRN: [id number] DOB: 01/15/1951 ? ? ?Reason for outreach : Care Coordination (Initial outreach to schedule with RNCM ) ? ? ?An unsuccessful telephone outreach was attempted today for assistance with care management and care coordination.  ? ?Follow Up Plan:  ?A HIPAA compliant phone message was left for the patient providing contact information and requesting a return call. The care management team will reach out to the patient again over the next 7 days. If patient returns call to provider office, please advise to call [place] at [phone]. ? ?Laverda Sorenson  ?Care Guide, Embedded Care Coordination ?[place]  Care Management  ?Direct Dial: [phone] ? ?

## 2021-07-22 ENCOUNTER — Other Ambulatory Visit (HOSPITAL_COMMUNITY): Payer: Self-pay | Admitting: *Deleted

## 2021-07-22 DIAGNOSIS — I5032 Chronic diastolic (congestive) heart failure: Secondary | ICD-10-CM

## 2021-07-22 NOTE — Chronic Care Management (AMB) (Signed)
?  Care Management  ? ?Outreach Note ? ?07/22/2021 ?Name: LECIA ESPERANZA MRN: [id number] DOB: June 18, 1950 ? ? ?Reason for outreach : Care Coordination (Initial outreach to schedule with RNCM ) ? ? ?A second unsuccessful telephone outreach was attempted today for assistance with care management and care coordination.  ? ?Follow Up Plan:  ?A HIPAA compliant phone message was left for the patient providing contact information and requesting a return call.  ?The care management team will reach out to the patient again over the next 7 days.  ?If patient returns call to provider office, please advise to call [place]* at [phone].* ? ?Laverda Sorenson  ?Care Guide, Embedded Care Coordination ?[place]  Care Management  ?Direct Dial: [phone] ? ?

## 2021-07-24 NOTE — Chronic Care Management (AMB) (Signed)
?  Care Management  ? ?Outreach Note ? ?07/24/2021 ?Name: [name] MRN: [id number] DOB: 11-Jun-1950 ? ? ?Reason for outreach : Care Coordination (Initial outreach to schedule with RNCM ) ? ? ?Third unsuccessful telephone outreach was attempted today for assistance with care management and care coordination.  The care management team is pleased to engage with this patient at any time in the future should he/she be interested in assistance from the care management team.  ? ?Follow Up Plan:  ?We have been unable to make contact with the patient. The care management team is available to follow up with the patient after provider conversation with the patient regarding recommendation for care management engagement and subsequent re-referral to the care management team.  ?Laverda Sorenson  ?Care Guide, Embedded Care Coordination ?[place]  Care Management  ?Direct Dial: [phone] ? ?

## 2021-08-06 ENCOUNTER — Encounter: Payer: Medicare Other | Admitting: Internal Medicine

## 2021-08-06 ENCOUNTER — Encounter: Payer: Self-pay | Admitting: Internal Medicine

## 2021-08-22 ENCOUNTER — Ambulatory Visit: Payer: Medicare Other | Admitting: Pulmonary Disease

## 2021-08-22 ENCOUNTER — Other Ambulatory Visit (INDEPENDENT_AMBULATORY_CARE_PROVIDER_SITE_OTHER): Payer: Medicare Other

## 2021-08-22 ENCOUNTER — Encounter: Payer: Self-pay | Admitting: Pulmonary Disease

## 2021-08-22 VITALS — BP 138/80 | HR 80 | Temp 98.2°F | Ht 68.0 in | Wt 224.6 lb

## 2021-08-22 DIAGNOSIS — R42 Dizziness and giddiness: Secondary | ICD-10-CM

## 2021-08-22 DIAGNOSIS — J45991 Cough variant asthma: Secondary | ICD-10-CM | POA: Diagnosis not present

## 2021-08-22 LAB — CBC WITH DIFFERENTIAL/PLATELET
Basophils Absolute: 0.1 10*3/uL (ref 0.0–0.1)
Basophils Relative: 0.9 % (ref 0.0–3.0)
Eosinophils Absolute: 0.4 10*3/uL (ref 0.0–0.7)
Eosinophils Relative: 7.4 % — ABNORMAL HIGH (ref 0.0–5.0)
HCT: 32.1 % — ABNORMAL LOW (ref 36.0–46.0)
Hemoglobin: 10.8 g/dL — ABNORMAL LOW (ref 12.0–15.0)
Lymphocytes Relative: 40.8 % (ref 12.0–46.0)
Lymphs Abs: 2.3 10*3/uL (ref 0.7–4.0)
MCHC: 33.7 g/dL (ref 30.0–36.0)
MCV: 84.8 fl (ref 78.0–100.0)
Monocytes Absolute: 0.4 10*3/uL (ref 0.1–1.0)
Monocytes Relative: 6.4 % (ref 3.0–12.0)
Neutro Abs: 2.5 10*3/uL (ref 1.4–7.7)
Neutrophils Relative %: 44.5 % (ref 43.0–77.0)
Platelets: 212 10*3/uL (ref 150.0–400.0)
RBC: 3.79 Mil/uL — ABNORMAL LOW (ref 3.87–5.11)
RDW: 13.6 % (ref 11.5–15.5)
WBC: 5.6 10*3/uL (ref 4.0–10.5)

## 2021-08-22 LAB — BASIC METABOLIC PANEL
BUN: 25 mg/dL — ABNORMAL HIGH (ref 6–23)
CO2: 28 mEq/L (ref 19–32)
Calcium: 9.7 mg/dL (ref 8.4–10.5)
Chloride: 106 mEq/L (ref 96–112)
Creatinine, Ser: 1.55 mg/dL — ABNORMAL HIGH (ref 0.40–1.20)
GFR: 33.23 mL/min — ABNORMAL LOW (ref >60.00)
Glucose, Bld: 93 mg/dL (ref 70–99)
Potassium: 4 mEq/L (ref 3.5–5.1)
Sodium: 141 mEq/L (ref 135–145)

## 2021-08-22 LAB — HEMOGLOBIN A1C: Hgb A1c MFr Bld: 6 % (ref 4.6–6.5)

## 2021-08-22 NOTE — Assessment & Plan Note (Addendum)
Her description of dizziness seems to favor orthostatic hypotension however she also describes some dizziness with walking.  Due to this, we will obtain basic blood work to make sure she is not anemic and electrolytes normal orthostatic hypotension could be related to antihypertensives.  Addendum -Labs show mild anemia and slight increase in creatinine from 1.38 baseline to 1.5 which could be related to Lasix

## 2021-08-22 NOTE — Patient Instructions (Addendum)
  X blood work - CBC, BMET, HbA1c

## 2021-08-22 NOTE — Progress Notes (Signed)
   Subjective:    Patient ID: [name], female    DOB: [birth year], 71 y.o.   MRN: [id number]  HPI  71 yo , never smoker, for FU of OSA & recurrent cough & wheezing  Attributed to cough variant asthma , sinuses & GERD  She reported intermittent wheezing especially in spring & fall for 3 yrs , wheezing was noted in the clinic in 01/2011 and she was given a diagnosis of asthma. She takes advair only seasonally. Off daily advair since 1/ 2014.   PMH : Breast cancer s/p chemo/RT .  -Grade 1 diastolic dysfunction noted  on echo.   Annual follow-up visit. Accompanied by her daughter.  Overall she is doing well complains of intermittent dyspnea, uses albuterol infrequently. She developed dyspnea at rest and was able to do breathing exercises and get better with that. She reports intermittent leg edema, she takes Lasix every other day. Weight has decreased from 235 last visit to 224 pounds  She reports some dizziness when she gets up from lying/sitting position and even when walking.  Medication review shows hydralazine and ARB  I reviewed previous visits with PCP, cardiology  Significant tests/ events reviewed  Methacholine challenge 11/2007 drop in smaller airways >> added singulair   spirometry 11/2007 wnl & 11/2010 -no obstruction.   Head CT 9/12  Ethmoid sinus mucosal thickening and bubbly opacity in the left sphenoid  PSG 06/2012  mild OSA -  she felt very claustrophobic, could not afford the CPAP     Review of Systems neg for any significant sore throat, dysphagia, itching, sneezing, nasal congestion or excess/ purulent secretions, fever, chills, sweats, unintended wt loss, pleuritic or exertional cp, hempoptysis, orthopnea pnd or change in chronic leg swelling. Also denies presyncope, palpitations, heartburn, abdominal pain, nausea, vomiting, diarrhea or change in bowel or urinary habits, dysuria,hematuria, rash, arthralgias, visual complaints, headache, numbness weakness or  ataxia.     Objective:   Physical Exam  Gen. Pleasant, obese, in no distress ENT - no lesions, no post nasal drip Neck: No JVD, no thyromegaly, no carotid bruits Lungs: no use of accessory muscles, no dullness to percussion, decreased without rales or rhonchi  Cardiovascular: Rhythm regular, heart sounds  normal, no murmurs or gallops, no peripheral edema Musculoskeletal: No deformities, no cyanosis or clubbing , no tremors        Assessment & Plan:

## 2021-08-22 NOTE — Assessment & Plan Note (Addendum)
Continue albuterol on an as-needed basis. We discussed breathing exercises She does have deviated nasal septum which could be contributing

## 2021-08-25 ENCOUNTER — Telehealth: Payer: Self-pay | Admitting: Pulmonary Disease

## 2021-08-26 NOTE — Telephone Encounter (Signed)
I gave her the labs and documented in the notes section. Closing encounter.

## 2021-09-13 ENCOUNTER — Encounter: Payer: Self-pay | Admitting: *Deleted

## 2021-10-06 ENCOUNTER — Encounter (HOSPITAL_COMMUNITY): Payer: Self-pay | Admitting: Internal Medicine

## 2021-10-06 ENCOUNTER — Ambulatory Visit (HOSPITAL_BASED_OUTPATIENT_CLINIC_OR_DEPARTMENT_OTHER)
Admission: RE | Admit: 2021-10-06 | Discharge: 2021-10-06 | Disposition: A | Discharge status: Home / Self Care | Payer: Medicare Other | Source: Ambulatory Visit | Attending: Internal Medicine | Admitting: Internal Medicine

## 2021-10-06 ENCOUNTER — Ambulatory Visit (HOSPITAL_COMMUNITY)
Admission: RE | Admit: 2021-10-06 | Discharge: 2021-10-06 | Disposition: A | Discharge status: Home / Self Care | Payer: Medicare Other | Source: Ambulatory Visit | Attending: Internal Medicine | Admitting: Internal Medicine

## 2021-10-06 VITALS — BP 140/80 | HR 69 | Wt 229.0 lb

## 2021-10-06 DIAGNOSIS — N1832 Chronic kidney disease, stage 3b: Secondary | ICD-10-CM | POA: Diagnosis present

## 2021-10-06 DIAGNOSIS — I5032 Chronic diastolic (congestive) heart failure: Secondary | ICD-10-CM

## 2021-10-06 DIAGNOSIS — E785 Hyperlipidemia, unspecified: Secondary | ICD-10-CM | POA: Insufficient documentation

## 2021-10-06 DIAGNOSIS — I10 Essential (primary) hypertension: Secondary | ICD-10-CM | POA: Diagnosis not present

## 2021-10-06 DIAGNOSIS — I130 Hypertensive heart and chronic kidney disease with heart failure and stage 1 through stage 4 chronic kidney disease, or unspecified chronic kidney disease: Secondary | ICD-10-CM | POA: Diagnosis not present

## 2021-10-06 LAB — ECHOCARDIOGRAM COMPLETE: Area-P 1/2: 3.77 cm2

## 2021-10-06 MED ORDER — EMPAGLIFLOZIN 10 MG PO TABS: 10.0000 mg | ORAL_TABLET | Freq: Every day | ORAL | 0 refills | Status: DC

## 2021-10-06 NOTE — Patient Instructions (Signed)
Stop Lasix  Start Jardiance '10mg'$  daily.  Your physician recommends that you schedule a follow-up appointment in: 1 year ** please call the office in April 2024 to arrange your follow up appointment **  If you have any questions or concerns before your next appointment please send [place] a message through [place] or call our office at [phone].    TO LEAVE A MESSAGE FOR THE NURSE SELECT OPTION 2, PLEASE LEAVE A MESSAGE INCLUDING: YOUR NAME DATE OF BIRTH CALL BACK NUMBER REASON FOR CALL**this is important as we prioritize the call backs  YOU WILL RECEIVE A CALL BACK THE SAME DAY AS LONG AS YOU CALL BEFORE 4:00 PM  At the [facility], you and your health needs are our priority. As part of our continuing mission to provide you with exceptional heart care, we have created designated Provider Care Teams. These Care Teams include your primary Cardiologist (physician) and Advanced Practice Providers (APPs- Physician Assistants and Nurse Practitioners) who all work together to provide you with the care you need, when you need it.   You may see any of the following providers on your designated Care Team at your next follow up: Dr [name] Dr Haynes Kerns, NP Lyda Jester, [place] [facility] [place], [place] Audry Riles, PharmD   Please be sure to bring in all your medications bottles to every appointment.

## 2021-10-06 NOTE — Progress Notes (Signed)
CARDIO-ONCOLOGY CLINIC CONSULT NOTE  Patient ID: [name], female   DOB: 29-Dec-1950, 71 y.o.   MRN: [id number]    HPI:  [name] is a 71 y/o women with h/o obesity, HTN, diabetes, asthma, breast CA, CKD IIIb and diastolic HF referred back to [facility] by Dr. Pamelia Hoit for furthe evaluation of recurrent CP and SOB.   Found to have L ductal breast CA. ER/PR +. HER 2 neu was equivocal. s/p bilateral mastectomies in 01/2010. Lymph nodes negative.   She completed herceptin therapy Jan. 2013.  PSG 06/2012 showed mild OSA -  she felt very claustrophobic, she could not afford the CPAP - hence abandoned  Echo 05/30/2012: EF 60-65% Grade 1 diastolic dysfunction Myoview 11/14 LV Ejection Fraction: 68%. LV Wall Motion: NL LV Function; NL Wall Motion  Echo 05/30/20 EF 60-65% grade 2 DD  Here today for follow-up. Echo today 10/06/21 EF 60-65%, grade 1 DD. She has been feeling well. Notes occasional shortness of breath which she attributes in part to asthma. Daughter notes she gets winded walking up an incline. Has been doing chair exercises but no regular aerobic exercise. No orthopnea, PND or LE edema. Checks BP at home, tends to average 120s-130s/70s. Follows low sodium diet.   Past Medical History:  Diagnosis Date   Anemia    Anxiety    Arthritis    DJD, lumbar spondylosis   Asthma    Back pain    Bilateral renal cysts    Breast cancer (HCC)    left breast invasive ductal ca in remission as of 10/07/11 s/p double mastectomy   Breast cancer of upper-outer quadrant of left female breast (HCC) 01/15/2010   Per Dr. Milta Deiters last clinic note 04/2012:  stage II a invasive ductal carcinoma of the left breast diagnosed in October 2011. She was found to have a ER/PR positive HER-2/neu positive breast cancer.She underwent bilateral mastectomies. Her right mastectomy was an elective prophylactic mastectomy due to her on comfort. Patient subsequently underwent adjuvant chemotherapy consisting of Taxotere carbop    Bronchitis 03/10/2012   Cataract    Chemotherapy follow-up examination 03/06/2011   Colon polyps    Cortical age-related cataract, bilateral 04/13/2016   Cough 03/06/2011   Depression    seasonal melancholia   E. coli UTI    Edema extremities    GERD (gastroesophageal reflux disease)    History of migraine headaches    Hyperlipidemia    Hypertension    Ileitis    Kidney stones    non obstructive    Myopathy    not statin related, cause unknown    Posterior vitreous detachment of right eye 04/13/2016   Current Outpatient Medications on File Prior to Encounter  Medication Sig Dispense Refill   Accu-Chek Softclix Lancets lancets Check blood sugar up to 3 times per day. 100 each 11   albuterol (PROAIR HFA) 108 (90 Base) MCG/ACT inhaler Inhale 1-2 puffs into the lungs every 6 (six) hours as needed for wheezing or shortness of breath. 6.7 g 11   Blood Glucose Monitoring Suppl (ACCU-CHEK GUIDE) w/Device KIT Check blood sugar up to 3 times a day 1 kit 1   carvedilol (COREG) 25 MG tablet Take 1 tablet (25 mg total) by mouth 2 (two) times daily with a meal. 180 tablet 3   cholecalciferol (VITAMIN D) 1000 UNITS tablet Take 1,000 Units by mouth daily.     diclofenac Sodium (VOLTAREN) 1 % GEL APPLY 4 GRAMS TOPICALLY 4  TIMES DAILY AS NEEDED  100 g 0   fluticasone (FLONASE) 50 MCG/ACT nasal spray USE 2 SPRAYS IN BOTH  NOSTRILS DAILY 16 g 3   furosemide (LASIX) 20 MG tablet Take 20 mg by mouth 2 (two) times a week.     gabapentin (NEURONTIN) 100 MG capsule Take 1 capsule (100 mg total) by mouth at bedtime. 90 capsule 0   glucose blood (ACCU-CHEK GUIDE) test strip Check blood sugar 3 times per day 100 each 12   hydrALAZINE (APRESOLINE) 25 MG tablet Take 75 mg by mouth 2 (two) times daily.     hydrocortisone (PROCTOZONE-HC) 2.5 % rectal cream Place 1 Application rectally as needed for hemorrhoids or anal itching.     LORazepam (ATIVAN) 1 MG tablet TAKE 1 TABLET BY MOUTH DAILY AS NEEDED FOR ANXIETY  /PANIC ATTACKS 10 tablet 0   metFORMIN (GLUCOPHAGE) 500 MG tablet Take 500 mg by mouth daily with breakfast.     Multiple Vitamin (MULTIVITAMIN) tablet Take 1 tablet by mouth daily.     olmesartan (BENICAR) 40 MG tablet Take 1 tablet (40 mg total) by mouth every evening. 90 tablet 3   simvastatin (ZOCOR) 20 MG tablet Take 1 tablet (20 mg total) by mouth at bedtime. 90 tablet 3   traMADol (ULTRAM) 50 MG tablet Take 50 mg by mouth as needed.     No current facility-administered medications on file prior to encounter.   Social History   Socioeconomic History   Marital status: Single    Spouse name: Not on file   Number of children: Not on file   Years of education: Not on file   Highest education level: Not on file  Occupational History   Not on file  Tobacco Use   Smoking status: Never   Smokeless tobacco: Never  Vaping Use   Vaping Use: Never used  Substance and Sexual Activity   Alcohol use: No    Alcohol/week: 0.0 standard drinks of alcohol   Drug use: No   Sexual activity: Not on file    Comment: Hysterectomy  Other Topics Concern   Not on file  Social History Narrative   Single, 2 kids   Never smoked   No alcohol use   No drugs   Laid off from [employer] after working there for 14 years.    Social Determinants of Health   Financial Resource Strain: Not on file  Food Insecurity: Not on file  Transportation Needs: Not on file  Physical Activity: Not on file  Stress: Not on file  Social Connections: Not on file   Family History  Problem Relation Age of Onset   Heart disease Mother 76       CAD   Breast cancer Mother    Cancer Mother 3       Breast cancer, mother   Cancer Father 46       colon cancer dx'ed age 56   Diabetes Father    Coronary artery disease Other    Multiple sclerosis Daughter    Diabetes Brother    Diabetes Brother     Vitals:   10/06/21 1421  BP: 140/80  Pulse: 69  SpO2: 98%  Weight: 103.9 kg (229 lb)    PHYSICAL  EXAM: General:  Well appearing. Ambulated into clinic. Daughter present. HEENT: normal Cor: PMI nondisplaced. Regular rate & rhythm. No rubs, gallops or murmurs. Lungs: clear Abdomen: soft, nontender, nondistended.  Extremities: no cyanosis, clubbing, rash, edema Neuro: alert & orientedx3, cranial nerves grossly intact. moves  all 4 extremities w/o difficulty. Affect pleasant     ASSESSMENT & PLAN:  1) CP - Lexi Scan Myoview 06/2020 normal.  - No recurrent CP.  2) Chronic diastolic HF - Echo 1/61/09 EF 60-65% grade 2 DD  - Echo today 10/06/21 EF 60-65% grade I DD - NYHA II. Volume looks good on exam.  - Recommend jardiance 10 mg daily which will also provide renal protection in setting of CKD. She is willing to try a 30 day trial on medication to see if tolerated. Samples provided. - Stop lasix. Use PRN. - Continue olmesartan. - Encouraged regular exercise.  - Already following low sodium diet  3) Breast cancer  - finished chemo. Remains in remission. No change  4) HTN  - Blood pressure mildly elevated today. Controlled at home. - Current regimen includes coreg 25 BID, olmesartan 40 mg qHS, hydralazine 75 TID  5) DM II - A1c 6.0% 06/23 - On metformin - Jardiance added as above   FINCH, LINDSAY N PA-C  2:36 PM   Patient seen and examined with the above-signed Advanced Practice Provider and/or Housestaff. I personally reviewed laboratory data, imaging studies and relevant notes. I independently examined the patient and formulated the important aspects of the plan. I have edited the note to reflect any of my changes or salient points. I have personally discussed the plan with the patient and/or family.  Doing well. Edema controlled with lasix 2x/week. Mild exertional dyspnea. SBP runs 130s.. No CP  Echo today EF 60-65% G1DD Personally reviewed  General:  Well appearing. No resp difficulty HEENT: normal Neck: supple. no JVD. Carotids 2+ bilat; no bruits. No  lymphadenopathy or thryomegaly appreciated. Cor: PMI nondisplaced. Regular rate & rhythm. No rubs, gallops or murmurs. Lungs: clear Abdomen: soft, nontender, nondistended. No hepatosplenomegaly. No bruits or masses. Good bowel sounds. Extremities: no cyanosis, clubbing, rash, tr edema Neuro: alert & orientedx3, cranial nerves grossly intact. moves all 4 extremities w/o difficulty. Affect pleasant   Overall doing well. Echo looks good. Long discussion of role of SGLT2i in setting of HFpEF. She would like to try. Will stop lasix. Start Jardiance 10 daily. F/u 1 year.   [name], MD  6:58 PM

## 2021-10-16 ENCOUNTER — Telehealth (HOSPITAL_COMMUNITY): Payer: Self-pay | Admitting: Pharmacy Technician

## 2021-10-16 ENCOUNTER — Other Ambulatory Visit (HOSPITAL_COMMUNITY): Payer: Self-pay | Admitting: *Deleted

## 2021-10-16 ENCOUNTER — Other Ambulatory Visit (HOSPITAL_COMMUNITY): Payer: Self-pay

## 2021-10-16 MED ORDER — EMPAGLIFLOZIN 10 MG PO TABS: 10.0000 mg | ORAL_TABLET | Freq: Every day | ORAL | 3 refills | Status: DC

## 2021-10-16 NOTE — Telephone Encounter (Signed)
Advanced Heart Failure Patient Advocate Encounter  Patient left a message requesting assistance with Jardiance. Called and spoke with patient. Current 90 day co-pay, $10.35. Patient is most likely receiving low income subsidy and would benefit from 90 day supplies for the most cost savings. Patient aware. No further action is needed at this time.  Charlann Boxer, CPhT

## 2021-10-17 ENCOUNTER — Telehealth (HOSPITAL_COMMUNITY): Payer: Self-pay | Admitting: *Deleted

## 2021-10-17 ENCOUNTER — Telehealth: Payer: Self-pay | Admitting: Pulmonary Disease

## 2021-10-17 MED ORDER — AMOXICILLIN-POT CLAVULANATE 875-125 MG PO TABS: 1.0000 | ORAL_TABLET | Freq: Two times a day (BID) | ORAL | 0 refills | Status: DC

## 2021-10-17 MED ORDER — PREDNISONE 10 MG PO TABS: 20.0000 mg | ORAL_TABLET | Freq: Every day | ORAL | 0 refills | Status: DC

## 2021-10-17 NOTE — Telephone Encounter (Signed)
Pt called stating jardiance has caused extreme nausea and she has stopped it. Pt asked if she needed to make any other med changes.  Routed to [employer]  *Dr.Bensimhon is on vacation

## 2021-10-17 NOTE — Telephone Encounter (Signed)
Spoke with pt who stated increased pressure in sinuses right below eyes and drainage in the back of throat. Pt denies fever/ chills/ GI upset/ SOB/ cough/ wheezing. Pt states prednisone has knocked out "sinus infection" in past. Dr. Elsworth Soho please advise.

## 2021-10-17 NOTE — Telephone Encounter (Signed)
Augmentin 875-125 BID for 10 days, prednisone 20 mg daily for 5 days for sinus infection. Please arrange f/u in clinic with Dr. Elsworth Soho or NP in next week or two. Thanks.

## 2021-10-17 NOTE — Telephone Encounter (Signed)
Called and spoke with patient. She verbalized understanding. RXs have sent. Nothing further needed.

## 2021-11-02 ENCOUNTER — Other Ambulatory Visit (HOSPITAL_COMMUNITY): Payer: Self-pay | Admitting: Internal Medicine

## 2022-01-01 NOTE — Telephone Encounter (Signed)
Done

## 2022-01-14 ENCOUNTER — Telehealth (HOSPITAL_COMMUNITY): Payer: Self-pay | Admitting: *Deleted

## 2022-01-14 NOTE — Telephone Encounter (Signed)
Pt left vm stating she was having issues with jardiance and stopped taking it. Pt wants to know if she needs to take another medication to replace jardiance. I called pt no answer.

## 2022-01-16 NOTE — Telephone Encounter (Signed)
Called pt no answer °

## 2022-03-17 ENCOUNTER — Telehealth: Payer: Self-pay | Admitting: Hematology and Oncology

## 2022-03-17 NOTE — Telephone Encounter (Signed)
Rescheduled appointment per provider [facility]. Left voicemail. 

## 2022-03-30 ENCOUNTER — Telehealth: Payer: Self-pay | Admitting: Hematology and Oncology

## 2022-03-30 NOTE — Telephone Encounter (Signed)
Scheduled appointment per provider admin time. Left voicemail.

## 2022-04-01 ENCOUNTER — Ambulatory Visit: Payer: Medicare Other | Admitting: Hematology and Oncology

## 2022-04-03 ENCOUNTER — Encounter: Payer: Self-pay | Admitting: Adult Health

## 2022-04-03 ENCOUNTER — Ambulatory Visit: Payer: Medicare Other | Admitting: Nurse Practitioner

## 2022-04-03 ENCOUNTER — Ambulatory Visit: Payer: Medicare Other | Admitting: Adult Health

## 2022-04-03 VITALS — BP 130/80 | HR 90 | Temp 98.2°F | Ht 68.0 in | Wt 225.4 lb

## 2022-04-03 DIAGNOSIS — J0191 Acute recurrent sinusitis, unspecified: Secondary | ICD-10-CM

## 2022-04-03 DIAGNOSIS — J45991 Cough variant asthma: Secondary | ICD-10-CM

## 2022-04-03 MED ORDER — PREDNISONE 10 MG PO TABS: ORAL_TABLET | ORAL | 0 refills | Status: DC

## 2022-04-03 MED ORDER — AMOXICILLIN-POT CLAVULANATE 875-125 MG PO TABS: 1.0000 | ORAL_TABLET | Freq: Two times a day (BID) | ORAL | 0 refills | Status: DC

## 2022-04-03 MED ORDER — BENZONATATE 200 MG PO CAPS: 200.0000 mg | ORAL_CAPSULE | Freq: Three times a day (TID) | ORAL | 1 refills | Status: DC | PRN

## 2022-04-03 MED ORDER — ALBUTEROL SULFATE (2.5 MG/3ML) 0.083% IN NEBU: 2.5000 mg | INHALATION_SOLUTION | Freq: Four times a day (QID) | RESPIRATORY_TRACT | 6 refills | Status: DC | PRN

## 2022-04-03 NOTE — Assessment & Plan Note (Signed)
Acute exacerbation of asthmatic bronchitis.  Plan  Patient Instructions  Augmentin '875mg'$  Twice daily  for 10 days , take with food.  Prednisone taper over next week  Zyrtec '10mg'$  At bedtime  As needed  drainage  Saline nasal spray/rinse  Twice daily  .  Continue on Flonase 2 puffs daily As needed nasal congestion .  Albuterol inhaler or neb .rpn  Neb machine order Delsym 2 tsp Twice daily  As needed  cough  Tessalon Three times a day  As needed   Follow up with Dr. Elsworth Soho  Or Calab Sachse NP in 4-6 weeks and As needed   Please contact office for sooner follow up if symptoms do not improve or worsen or seek emergency care

## 2022-04-03 NOTE — Progress Notes (Signed)
$'@Patient'N$  ID: [name], female    DOB: Jul 04, 1950, 72 y.o.   MRN: [id number]  Chief Complaint  Patient presents with   Acute Visit    Referring provider: Arthur Holms, NP  HPI: 72 year old followed for cough variant asthma History of very mild sleep apnea CPAP intolerant, breast cancer status post chemo and radiation, diastolic heart failure  TEST/EVENTS :   04/03/2022 Acute OV : Cough  Patient presents for an acute office visit.  Patient complains over the last week she has had sinus congestion, sinus pain pressure teeth pain painful cough with thick productive mucus.  She is also has some intermittent wheezing.  She was given a doxycycline prescription but was unable to tolerate.  Patient denies any chest pain, orthopnea, edema.  Appetite is okay with no nausea vomiting diarrhea.  Family member has had similar symptoms. Has tested for COVID x 2 at home and was negative.   Allergies  Allergen Reactions   Atorvastatin Nausea Only    REACTION: Nausea, abdominal pain, and reflux. Pt has used the medication in the past and experienced the same reaction before the medication was discontinued.   Hctz [Hydrochlorothiazide]     Blurry vision   Norvasc [Amlodipine Besylate]     Blurry vision   Other Other (See Comments)   Rosuvastatin Itching    Immunization History  Administered Date(s) Administered   Fluad Quad(high Dose 65+) 12/19/2021   Hepatitis A, Adult 09/27/2017   Hepatitis B, adult 09/27/2017, 11/01/2017   Hepb-cpg 03/31/2018   Influenza Split 12/07/2017   Influenza Whole 02/01/2006, 01/21/2007, 12/30/2007, 02/20/2009, 12/30/2009, 12/22/2010, 11/27/2011   Influenza, High Dose Seasonal PF 12/06/2020   Influenza,inj,Quad PF,6+ Mos 12/07/2012, 11/29/2013, 12/17/2014, 11/11/2015, 01/04/2017, 12/01/2018   Moderna Covid-19 Vaccine Bivalent Booster 45yr & up 12/06/2020   Moderna SARS-COV2 Booster Vaccination 06/16/2019, 07/21/2019, 11/20/2019   PFIZER(Purple  Top)SARS-COV-2 Vaccination 07/14/2021   Pneumococcal Conjugate-13 01/18/2014   Pneumococcal Polysaccharide-23 02/21/2004, 03/23/2010, 05/29/2015, 07/14/2021   Td 02/20/2002   Tdap 06/14/2012   Zoster Recombinat (Shingrix) 12/08/2018    Past Medical History:  Diagnosis Date   Anemia    Anxiety    Arthritis    DJD, lumbar spondylosis   Asthma    Back pain    Bilateral renal cysts    [place]    left breast invasive ductal ca in remission as of 10/07/11 s/p double mastectomy   [place]   Per Dr. KLaurelyn Sicklelast clinic note 04/2012:  stage II a invasive ductal carcinoma of the left breast diagnosed in October 2011. She was found to have a ER/PR positive HER-2/neu positive breast cancer.She underwent bilateral mastectomies. Her right mastectomy was an elective prophylactic mastectomy due to her on comfort. Patient subsequently underwent adjuvant chemotherapy consisting of Taxotere carbop   Bronchitis 03/10/2012   Cataract    Chemotherapy follow-up examination 03/06/2011   Colon polyps    Cortical age-related cataract, bilateral 04/13/2016   Cough 03/06/2011   Depression    seasonal melancholia   E. coli UTI    Edema extremities    GERD (gastroesophageal reflux disease)    History of migraine headaches    Hyperlipidemia    Hypertension    Ileitis    Kidney stones    non obstructive    Myopathy    not statin related, cause unknown    Posterior vitreous detachment of right eye 04/13/2016    Tobacco History: Social History  Tobacco Use  Smoking Status Never  Smokeless Tobacco Never   Counseling given: Not Answered   Outpatient Medications Prior to Visit  Medication Sig Dispense Refill   Accu-Chek Softclix Lancets lancets Check blood sugar up to 3 times per day. 100 each 11   albuterol (PROAIR HFA) 108 (90 Base) MCG/ACT inhaler Inhale 1-2 puffs into the lungs every 6 (six) hours as needed for  wheezing or shortness of breath. 6.7 g 11   Blood Glucose Monitoring Suppl (ACCU-CHEK GUIDE) w/Device KIT Check blood sugar up to 3 times a day 1 kit 1   carvedilol (COREG) 25 MG tablet Take 1 tablet (25 mg total) by mouth 2 (two) times daily with a meal. 180 tablet 3   cholecalciferol (VITAMIN D) 1000 UNITS tablet Take 1,000 Units by mouth daily.     diclofenac Sodium (VOLTAREN) 1 % GEL APPLY 4 GRAMS TOPICALLY 4  TIMES DAILY AS NEEDED 100 g 0   fluticasone (FLONASE) 50 MCG/ACT nasal spray USE 2 SPRAYS IN BOTH  NOSTRILS DAILY 16 g 3   gabapentin (NEURONTIN) 100 MG capsule Take 1 capsule (100 mg total) by mouth at bedtime. 90 capsule 0   glucose blood (ACCU-CHEK GUIDE) test strip Check blood sugar 3 times per day 100 each 12   hydrALAZINE (APRESOLINE) 25 MG tablet Take 75 mg by mouth 2 (two) times daily.     hydrocortisone (PROCTOZONE-HC) 2.5 % rectal cream Place 1 Application rectally as needed for hemorrhoids or anal itching.     JARDIANCE 10 MG TABS tablet TAKE 1 TABLET BY MOUTH DAILY BEFORE BREAKFAST. 90 tablet 3   LORazepam (ATIVAN) 1 MG tablet TAKE 1 TABLET BY MOUTH DAILY AS NEEDED FOR ANXIETY /PANIC ATTACKS 10 tablet 0   metFORMIN (GLUCOPHAGE) 500 MG tablet Take 500 mg by mouth daily with breakfast.     Multiple Vitamin (MULTIVITAMIN) tablet Take 1 tablet by mouth daily.     olmesartan (BENICAR) 40 MG tablet Take 1 tablet (40 mg total) by mouth every evening. 90 tablet 3   simvastatin (ZOCOR) 20 MG tablet Take 1 tablet (20 mg total) by mouth at bedtime. 90 tablet 3   traMADol (ULTRAM) 50 MG tablet Take 50 mg by mouth as needed.     amoxicillin-clavulanate (AUGMENTIN) 875-125 MG tablet Take 1 tablet by mouth 2 (two) times daily. (Patient not taking: Reported on 04/03/2022) 20 tablet 0   doxycycline (MONODOX) 100 MG capsule Take 100 mg by mouth 2 (two) times daily. (Patient not taking: Reported on 04/03/2022)     predniSONE (DELTASONE) 10 MG tablet Take 2 tablets (20 mg total) by mouth daily  with breakfast. (Patient not taking: Reported on 04/03/2022) 10 tablet 0   No facility-administered medications prior to visit.     Review of Systems:   Constitutional:   No  weight loss, night sweats,  Fevers, chills,  +fatigue, or  lassitude.  HEENT:   No headaches,  Difficulty swallowing,  Tooth/dental problems, or  Sore throat,                No sneezing, itching, ear ache,  +nasal congestion, post nasal drip,   CV:  No chest pain,  Orthopnea, PND, swelling in lower extremities, anasarca, dizziness, palpitations, syncope.   GI  No heartburn, indigestion, abdominal pain, nausea, vomiting, diarrhea, change in bowel habits, loss of appetite, bloody stools.   Resp:   No chest wall deformity  Skin: no rash or lesions.  GU: no dysuria, change in color of  urine, no urgency or frequency.  No flank pain, no hematuria   MS:  No joint pain or swelling.  No decreased range of motion.  No back pain.    Physical Exam  BP 130/80 (BP Location: Right Arm, Patient Position: Sitting, Cuff Size: Large)   Pulse 90   Temp 98.2 F (36.8 C) (Oral)   Ht '5\' 8"'$  (1.727 m)   Wt 225 lb 6.4 oz (102.2 kg)   LMP 12/31/1968   SpO2 97%   BMI 34.27 kg/m   GEN: A/Ox3; pleasant , NAD, well nourished    HEENT:  [place]/AT,  EACs-clear, TMs-wnl, NOSE-clear, THROAT-clear, no lesions, no postnasal drip or exudate noted.   NECK:  Supple w/ fair ROM; no JVD; normal carotid impulses w/o bruits; no thyromegaly or nodules palpated; no lymphadenopathy.    RESP  Clear  P & A; w/o, wheezes/ rales/ or rhonchi. no accessory muscle use, no dullness to percussion  CARD:  RRR, no m/r/g, no peripheral edema, pulses intact, no cyanosis or clubbing.  GI:   Soft & nt; nml bowel sounds; no organomegaly or masses detected.   Musco: Warm bil, no deformities or joint swelling noted.   Neuro: alert, no focal deficits noted.    Skin: Warm, no lesions or rashes    Lab Results:   BMET     Imaging: No results  found.        No data to display          Lab Results  Component Value Date   NITRICOXIDE 73 04/08/2018        Assessment & Plan:   Acute sinusitis Sinusitis will treat with a 10-day course of Augmentin  Plan  Patient Instructions  Augmentin '875mg'$  Twice daily  for 10 days , take with food.  Prednisone taper over next week  Zyrtec '10mg'$  At bedtime  As needed  drainage  Saline nasal spray/rinse  Twice daily  .  Continue on Flonase 2 puffs daily As needed nasal congestion .  Albuterol inhaler or neb .rpn  Neb machine order Delsym 2 tsp Twice daily  As needed  cough  Tessalon Three times a day  As needed   Follow up with Dr. Elsworth Soho  Or Nadea Kirkland NP in 4-6 weeks and As needed   Please contact office for sooner follow up if symptoms do not improve or worsen or seek emergency care      Cough variant asthma Acute exacerbation of asthmatic bronchitis.  Plan  Patient Instructions  Augmentin '875mg'$  Twice daily  for 10 days , take with food.  Prednisone taper over next week  Zyrtec '10mg'$  At bedtime  As needed  drainage  Saline nasal spray/rinse  Twice daily  .  Continue on Flonase 2 puffs daily As needed nasal congestion .  Albuterol inhaler or neb .rpn  Neb machine order Delsym 2 tsp Twice daily  As needed  cough  Tessalon Three times a day  As needed   Follow up with Dr. Elsworth Soho  Or Senica Crall NP in 4-6 weeks and As needed   Please contact office for sooner follow up if symptoms do not improve or worsen or seek emergency care        Rexene Edison, NP 04/03/2022

## 2022-04-03 NOTE — Patient Instructions (Addendum)
Augmentin '875mg'$  Twice daily  for 10 days , take with food.  Prednisone taper over next week  Zyrtec '10mg'$  At bedtime  As needed  drainage  Saline nasal spray/rinse  Twice daily  .  Continue on Flonase 2 puffs daily As needed nasal congestion .  Albuterol inhaler or neb .rpn  Neb machine order Delsym 2 tsp Twice daily  As needed  cough  Tessalon Three times a day  As needed   Follow up with Dr. Elsworth Soho  Or Xandria Gallaga NP in 4-6 weeks and As needed   Please contact office for sooner follow up if symptoms do not improve or worsen or seek emergency care

## 2022-04-03 NOTE — Assessment & Plan Note (Signed)
Sinusitis will treat with a 10-day course of Augmentin  Plan  Patient Instructions  Augmentin '875mg'$  Twice daily  for 10 days , take with food.  Prednisone taper over next week  Zyrtec '10mg'$  At bedtime  As needed  drainage  Saline nasal spray/rinse  Twice daily  .  Continue on Flonase 2 puffs daily As needed nasal congestion .  Albuterol inhaler or neb .rpn  Neb machine order Delsym 2 tsp Twice daily  As needed  cough  Tessalon Three times a day  As needed   Follow up with Dr. Elsworth Soho  Or Benard Minturn NP in 4-6 weeks and As needed   Please contact office for sooner follow up if symptoms do not improve or worsen or seek emergency care

## 2022-04-07 ENCOUNTER — Telehealth: Payer: Self-pay | Admitting: Adult Health

## 2022-04-07 DIAGNOSIS — J45991 Cough variant asthma: Secondary | ICD-10-CM

## 2022-04-07 NOTE — Telephone Encounter (Signed)
Cxr ordered already and I spoke with the pt and notified that that this was done. She is aware can work in later this wk if needed.

## 2022-04-07 NOTE — Telephone Encounter (Signed)
Yes  I can put Chest xray in . Tell her to come by .  If not improving will need office visit.  I can work her in this week.  Please contact office for sooner follow up if symptoms do not improve or worsen or seek emergency care

## 2022-04-07 NOTE — Telephone Encounter (Signed)
Called and spoke to patient and she is asking to come in today if possilbe for a chest xray. She states that Tammy told her that if she was still not feeling well after her office visit she can get a chest xray. She states she still has a lot of wheezing and using the ABT and nebs as ordered. But she is just asking for a chest xray to make sure she is ok.  TP is it ok if patient comes in today for just a chest xray.  Please advise and if you agree I will place order.

## 2022-04-08 ENCOUNTER — Ambulatory Visit (INDEPENDENT_AMBULATORY_CARE_PROVIDER_SITE_OTHER): Payer: Medicare Other

## 2022-04-08 DIAGNOSIS — J45991 Cough variant asthma: Secondary | ICD-10-CM

## 2022-04-10 ENCOUNTER — Ambulatory Visit: Payer: Medicare Other | Admitting: Hematology and Oncology

## 2022-04-10 ENCOUNTER — Other Ambulatory Visit: Payer: Self-pay

## 2022-04-10 ENCOUNTER — Inpatient Hospital Stay: Payer: Medicare Other | Attending: Hematology and Oncology | Admitting: Hematology and Oncology

## 2022-04-10 VITALS — BP 159/77 | HR 71 | Temp 97.2°F | Resp 15 | Wt 227.2 lb

## 2022-04-10 DIAGNOSIS — Z79811 Long term (current) use of aromatase inhibitors: Secondary | ICD-10-CM | POA: Insufficient documentation

## 2022-04-10 DIAGNOSIS — Z170 Estrogen receptor positive status [ER+]: Secondary | ICD-10-CM

## 2022-04-10 DIAGNOSIS — C50412 Malignant neoplasm of upper-outer quadrant of left female breast: Secondary | ICD-10-CM | POA: Diagnosis not present

## 2022-04-10 DIAGNOSIS — Z9013 Acquired absence of bilateral breasts and nipples: Secondary | ICD-10-CM | POA: Diagnosis not present

## 2022-04-10 NOTE — Progress Notes (Signed)
Patient Care Team: Arthur Holms, NP as PCP - General (Nurse Practitioner) Haroldine Laws Shaune Pascal, MD as Referring Physician (Cardiology) Rutherford Guys, MD as Consulting Physician (Ophthalmology)  DIAGNOSIS:  Encounter Diagnosis  Name Primary?   Malignant neoplasm of upper-outer quadrant of left breast in female, estrogen receptor positive ([place]) Yes    SUMMARY OF ONCOLOGIC HISTORY: Oncology History  Breast cancer of upper-outer quadrant of left female breast ([place])  01/15/2010 Surgery    bilateral mastectomies: left breast IDC , ER/PR positive HER-2 positive  stage II a;  right breast benign   04/17/2010 - 04/17/2011 Chemotherapy   weekly Taxotere carboplatinum and Herceptin x 6 cycles  followed by Herceptin maintenance   09/01/2010 - 09/02/2015 Anti-estrogen oral therapy    letrozole 2.5 mg daily (BCI revealed 10%, high risk of recurrence but low probability of benefit from extended adjuvant therapy)     CHIEF COMPLIANT: Surveillance of laparoscopically with bilateral mastectomies  breast cancer    INTERVAL HISTORY: [name] is a 72 year old with above-mentioned history laparoscopically with bilateral mastectomies and adjuvant chemotherapy followed by Herceptin maintenance. She reports that she had a sinus infection. She states that she still have a deep cough, but denies any fever. She denies any pain or discomfort in breast.   ALLERGIES:  is allergic to atorvastatin, hctz [hydrochlorothiazide], norvasc [amlodipine besylate], other, and rosuvastatin.  MEDICATIONS:  Current Outpatient Medications  Medication Sig Dispense Refill   Accu-Chek Softclix Lancets lancets Check blood sugar up to 3 times per day. 100 each 11   albuterol (PROAIR HFA) 108 (90 Base) MCG/ACT inhaler Inhale 1-2 puffs into the lungs every 6 (six) hours as needed for wheezing or shortness of breath. 6.7 g 11   albuterol (PROVENTIL) (2.5 MG/3ML) 0.083% nebulizer solution Take 3 mLs (2.5 mg total) by  nebulization every 6 (six) hours as needed for wheezing or shortness of breath. 75 mL 6   amoxicillin-clavulanate (AUGMENTIN) 875-125 MG tablet Take 1 tablet by mouth 2 (two) times daily. 20 tablet 0   benzonatate (TESSALON) 200 MG capsule Take 1 capsule (200 mg total) by mouth 3 (three) times daily as needed. 45 capsule 1   Blood Glucose Monitoring Suppl (ACCU-CHEK GUIDE) w/Device KIT Check blood sugar up to 3 times a day 1 kit 1   carvedilol (COREG) 25 MG tablet Take 1 tablet (25 mg total) by mouth 2 (two) times daily with a meal. 180 tablet 3   cholecalciferol (VITAMIN D) 1000 UNITS tablet Take 1,000 Units by mouth daily.     diclofenac Sodium (VOLTAREN) 1 % GEL APPLY 4 GRAMS TOPICALLY 4  TIMES DAILY AS NEEDED 100 g 0   fluticasone (FLONASE) 50 MCG/ACT nasal spray USE 2 SPRAYS IN BOTH  NOSTRILS DAILY 16 g 3   gabapentin (NEURONTIN) 100 MG capsule Take 1 capsule (100 mg total) by mouth at bedtime. 90 capsule 0   glucose blood (ACCU-CHEK GUIDE) test strip Check blood sugar 3 times per day 100 each 12   hydrALAZINE (APRESOLINE) 25 MG tablet Take 75 mg by mouth 2 (two) times daily.     hydrocortisone (PROCTOZONE-HC) 2.5 % rectal cream Place 1 Application rectally as needed for hemorrhoids or anal itching.     LORazepam (ATIVAN) 1 MG tablet TAKE 1 TABLET BY MOUTH DAILY AS NEEDED FOR ANXIETY /PANIC ATTACKS 10 tablet 0   metFORMIN (GLUCOPHAGE) 500 MG tablet Take 500 mg by mouth daily with breakfast.     Multiple Vitamin (MULTIVITAMIN) tablet Take 1 tablet by mouth  daily.     olmesartan (BENICAR) 40 MG tablet Take 1 tablet (40 mg total) by mouth every evening. 90 tablet 3   simvastatin (ZOCOR) 20 MG tablet Take 1 tablet (20 mg total) by mouth at bedtime. 90 tablet 3   traMADol (ULTRAM) 50 MG tablet Take 50 mg by mouth as needed.     No current facility-administered medications for this visit.    PHYSICAL EXAMINATION: ECOG PERFORMANCE STATUS: 1 - Symptomatic but completely ambulatory  Vitals:    04/10/22 1149  BP: (!) 159/77  Pulse: 71  Resp: 15  Temp: (!) 97.2 F (36.2 C)  SpO2: 99%   Filed Weights   04/10/22 1149  Weight: 227 lb 3.2 oz (103.1 kg)    BREAST:Bil mastectomies. No lumps or nodules. (exam performed in the presence of a chaperone)  LABORATORY DATA:  I have reviewed the data as listed    Latest Ref Rng & Units 08/22/2021   11:29 AM 09/04/2020    8:57 PM 05/27/2020   10:17 AM  CMP  Glucose 70 - 99 mg/dL 93  177  116   BUN 6 - 23 mg/dL '25  12  17   '$ Creatinine 0.40 - 1.20 mg/dL 1.55  1.33  1.52   Sodium 135 - 145 mEq/L 141  142  141   Potassium 3.5 - 5.1 mEq/L 4.0  4.0  4.1   Chloride 96 - 112 mEq/L 106  106  108   CO2 19 - 32 mEq/L '28  26  27   '$ Calcium 8.4 - 10.5 mg/dL 9.7  9.0  9.9   Total Protein 6.5 - 8.1 g/dL   7.5   Total Bilirubin 0.3 - 1.2 mg/dL   0.5   Alkaline Phos 38 - 126 U/L   64   AST 15 - 41 U/L   23   ALT 0 - 44 U/L   28     Lab Results  Component Value Date   WBC 5.6 08/22/2021   HGB 10.8 (L) 08/22/2021   HCT 32.1 (L) 08/22/2021   MCV 84.8 08/22/2021   PLT 212.0 08/22/2021   NEUTROABS 2.5 08/22/2021    ASSESSMENT & PLAN:  Breast cancer of upper-outer quadrant of left female breast ([place]) Left breast invasive ductal carcinoma stage II a ER/PR positive HER-2 positive status post bilateral mastectomies (Right CVA, Rt IJ DVT)  Followed by 6 cycles of [place] followed by Herceptin maintenance completed 04/17/2011  Letrozole June 2012-June 2017 BCI: High risk: 10% risk of late distant recurrence, no probability of benefit from extended adjuvant therapy.   Breast Cancer Surveillance: 1. Breast exam 04/10/2022: Benign 2.  No role of imaging studies since she had bilateral mastectomies 3.  Bone density 05/24/2019: normal T score 0.1  4.  Bone scan 05/12/2020: no evidence of metastatic disease to the bone. 5. MRI thoracic spine 05/03/2019: No evidence of metastatic disease.   Return to clinic in 1 year for follow-up      No orders of the  defined types were placed in this encounter.  The patient has a good understanding of the overall plan. she agrees with it. she will call with any problems that may develop before the next visit here. Total time spent: 30 mins including face to face time and time spent for planning, charting and co-ordination of care   Harriette Ohara, MD 04/10/22    I Gardiner Coins am acting as a Education administrator for Dr.Heman Que  I have reviewed the above  documentation for accuracy and completeness, and I agree with the above.

## 2022-04-10 NOTE — Assessment & Plan Note (Signed)
Left breast invasive ductal carcinoma stage II a ER/PR positive HER-2 positive status post bilateral mastectomies (Right CVA, Rt IJ DVT)  Followed by 6 cycles of [place] followed by Herceptin maintenance completed 04/17/2011  Letrozole June 2012-June 2017 BCI: High risk: 10% risk of late distant recurrence, no probability of benefit from extended adjuvant therapy.   Breast Cancer Surveillance: 1. Breast exam 04/10/2022: Benign 2.  No role of imaging studies since she had bilateral mastectomies 3.  Bone density 05/24/2019: normal T score 0.1  4.  Bone scan 05/12/2020: no evidence of metastatic disease to the bone. 5. MRI thoracic spine 05/03/2019: No evidence of metastatic disease.   Return to clinic in 1 year for follow-up

## 2022-05-06 ENCOUNTER — Ambulatory Visit (HOSPITAL_BASED_OUTPATIENT_CLINIC_OR_DEPARTMENT_OTHER): Payer: Medicare Other | Admitting: Pulmonary Disease

## 2022-07-04 ENCOUNTER — Encounter (HOSPITAL_COMMUNITY): Payer: Self-pay

## 2022-07-04 ENCOUNTER — Ambulatory Visit (HOSPITAL_COMMUNITY)
Admission: EM | Admit: 2022-07-04 | Discharge: 2022-07-04 | Disposition: A | Discharge status: Home / Self Care | Payer: Medicare Other

## 2022-07-04 DIAGNOSIS — H1013 Acute atopic conjunctivitis, bilateral: Secondary | ICD-10-CM

## 2022-07-04 DIAGNOSIS — J4520 Mild intermittent asthma, uncomplicated: Secondary | ICD-10-CM

## 2022-07-04 DIAGNOSIS — N1832 Chronic kidney disease, stage 3b: Secondary | ICD-10-CM

## 2022-07-04 DIAGNOSIS — J309 Allergic rhinitis, unspecified: Secondary | ICD-10-CM | POA: Diagnosis not present

## 2022-07-04 MED ORDER — BENZONATATE 100 MG PO CAPS: 100.0000 mg | ORAL_CAPSULE | Freq: Three times a day (TID) | ORAL | 0 refills | Status: DC

## 2022-07-04 NOTE — ED Triage Notes (Signed)
Nasal congestion, watery eyes, cough, wheezing, and fever onset yesterday. Patient's room mate sick as well. Patient does have a history of seasonal allergies.  Tried dayquil with no relief.

## 2022-07-04 NOTE — Discharge Instructions (Signed)
Your symptoms are likely due to environmental allergies.  Avoid exposure to allergens. Take oral antihistamine Claritin (loratadine) and use Flonase nasal spray daily as directed. You can buy these medications over the counter. These medications can take a few days to fully kick in to your body and start working. Return for any new or worsening symptoms.  If your eyes become itchy, you may purchase olopatadine (Pataday) eyedrops over the counter and use as directed to relieve watery/itchy eyes associated with allergies as well.   Tessalon perles every 8 hours as needed for cough.  If your symptoms are severe, please go to the emergency room for further evaluation. Schedule an appointment with your primary care provider for follow-up and further management of your seasonal allergies as well as ongoing preventive healthcare. I hope you feel better!

## 2022-07-04 NOTE — ED Provider Notes (Signed)
[facility]    CSN: [id number] Arrival date & time: 07/04/22  1002      History   Chief Complaint Chief Complaint  Patient presents with   Sinus Problem    HPI [name] is a 72 y.o. female.   Patient with past medical history significant for CKD stage III, type 2 diabetes, asthma, bronchitis, hypertension, and chronic rhinitis presents to urgent care for evaluation of watery/itchy eyes, sneezing, rhinorrhea, nasal congestion, itchy throat, and dry cough that started yesterday.  She states she was outside for the majority of the day yesterday and symptoms began last night.  No blurry vision, decreased visual acuity, or diplopia reported.  Drainage from the eyes bilaterally is clear.  No eye redness reported or pain behind the eyes.  No headache, chest pain, shortness of breath, heart palpitations, nausea, vomiting, dizziness, or decreased oral intake.  She states she has heard some wheezing to her chest last night and used her albuterol inhaler which helped significantly with symptoms.  Not currently experiencing any wheezing.  No fever, chills, body aches, or night sweats.  No recent antibiotic or steroid use reported.  She does have a history of seasonal allergies but does not currently take any medications for this.  She took Claritin last year and states that it "did not help very much".     Sinus Problem    Past Medical History:  Diagnosis Date   Anemia    Anxiety    Arthritis    DJD, lumbar spondylosis   Asthma    Back pain    Bilateral renal cysts    Breast cancer    left breast invasive ductal ca in remission as of 10/07/11 s/p double mastectomy   Breast cancer of upper-outer quadrant of left female breast 01/15/2010   Per Dr. Milta Deiters last clinic note 04/2012:  stage II a invasive ductal carcinoma of the left breast diagnosed in October 2011. She was found to have a ER/PR positive HER-2/neu positive breast cancer.She underwent bilateral mastectomies. Her  right mastectomy was an elective prophylactic mastectomy due to her on comfort. Patient subsequently underwent adjuvant chemotherapy consisting of Taxotere carbop   Bronchitis 03/10/2012   Cataract    Chemotherapy follow-up examination 03/06/2011   Colon polyps    Cortical age-related cataract, bilateral 04/13/2016   Cough 03/06/2011   Depression    seasonal melancholia   E. coli UTI    Edema extremities    GERD (gastroesophageal reflux disease)    History of migraine headaches    Hyperlipidemia    Hypertension    Ileitis    Kidney stones    non obstructive    Myopathy    not statin related, cause unknown    Posterior vitreous detachment of right eye 04/13/2016    Patient Active Problem List   Diagnosis Date Noted   Left thigh pain 05/23/2021   Acute sinusitis 03/10/2021   Tinnitus, left 02/20/2021   Dyspnea 04/30/2020   Panic attack 04/07/2020   Sacral pain 03/27/2020   Knee injury 03/27/2020   Melasma 12/02/2019   Tailor's bunion of both feet 12/02/2019   Mixed conductive and sensorineural hearing loss of both ears 10/06/2019   Bilateral hearing loss 07/31/2019   Deviated septum 07/31/2019   ETD (Eustachian tube dysfunction), bilateral 07/31/2019   Nasal turbinate hypertrophy 07/31/2019   Hearing loss of left ear 07/19/2019   Post-mastectomy pain 03/30/2019   Peripheral neuropathy 12/07/2018   Chronic rhinitis 04/08/2018   Nonalcoholic  fatty liver disease 09/22/2017   Dry eye syndrome of both lacrimal glands 05/11/2017   Hyperplastic colon polyp 10/19/2016   Age-related nuclear cataract, bilateral 04/13/2016   Hyperopia with presbyopia, bilateral 04/13/2016   Chest pain, musculoskeletal 01/21/2016   Anemia of chronic disease 05/30/2015   Osteoarthritis of right knee 05/01/2015   Morbid obesity 02/27/2015   Chronic diastolic heart failure (HCC) 12/04/2013   Episode of dizziness 08/21/2013   Dysuria 07/08/2013   Plantar fasciitis of left foot 03/03/2013   OSA  (obstructive sleep apnea) 06/13/2012   Healthcare maintenance 03/26/2011   DM type 2 (diabetes mellitus, type 2) 07/17/2010   Breast cancer of upper-outer quadrant of left female breast 01/15/2010   GERD (gastroesophageal reflux disease) 10/15/2009   Cough variant asthma 02/09/2008   CKD (chronic kidney disease) stage 3, GFR 30-59 ml/min (HCC) 08/30/2006   Hyperlipidemia associated with type 2 diabetes mellitus (HCC) 02/22/2006   Hypertension associated with diabetes 02/22/2006    Past Surgical History:  Procedure Laterality Date   ABDOMINAL HYSTERECTOMY     for fibroids    APPENDECTOMY     BREAST SURGERY  2011   Bilateral mastectomy   CYSTOSCOPY WITH URETHRAL DILATATION N/A 12/26/2019   Procedure: CYSTOSCOPY WITH URETHRAL DILATATION;  Surgeon: Noel Christmas, MD;  Location: [facility];  Service: Urology;  Laterality: N/A;  30 MINS   INCONTINENCE SURGERY     LAMINECTOMY     C5-6 Dr. Otelia Sergeant 2005    MASTECTOMY     bilateral    OOPHORECTOMY     TONSILLECTOMY      OB History   No obstetric history on file.      Home Medications    Prior to Admission medications   Medication Sig Start Date End Date Taking? Authorizing Provider  albuterol (PROAIR HFA) 108 (90 Base) MCG/ACT inhaler Inhale 1-2 puffs into the lungs every 6 (six) hours as needed for wheezing or shortness of breath. 02/18/21  Yes Dolan Amen, MD  albuterol (PROVENTIL) (2.5 MG/3ML) 0.083% nebulizer solution Take 3 mLs (2.5 mg total) by nebulization every 6 (six) hours as needed for wheezing or shortness of breath. 04/03/22  Yes Parrett, Tammy S, NP  benzonatate (TESSALON) 100 MG capsule Take 1 capsule (100 mg total) by mouth every 8 (eight) hours. 07/04/22  Yes Carlisle Beers, FNP  carvedilol (COREG) 25 MG tablet Take 1 tablet (25 mg total) by mouth 2 (two) times daily with a meal. 05/22/21  Yes Masters, Katie, DO  cholecalciferol (VITAMIN D) 1000 UNITS tablet Take 1,000 Units by mouth daily.   Yes [provider]  diclofenac Sodium (VOLTAREN) 1 % GEL APPLY 4 GRAMS TOPICALLY 4  TIMES DAILY AS NEEDED 02/17/21  Yes Dolan Amen, MD  fluticasone [facility]) 50 MCG/ACT nasal spray USE 2 SPRAYS IN BOTH  NOSTRILS DAILY 05/22/21  Yes Masters, Katie, DO  gabapentin (NEURONTIN) 100 MG capsule Take 1 capsule (100 mg total) by mouth at bedtime. 09/05/20  Yes Vivi Barrack, DPM  hydrALAZINE (APRESOLINE) 25 MG tablet Take 75 mg by mouth 2 (two) times daily.   Yes [provider]  hydrocortisone (PROCTOZONE-HC) 2.5 % rectal cream Place 1 Application rectally as needed for hemorrhoids or anal itching.   Yes [provider]  LORazepam (ATIVAN) 1 MG tablet TAKE 1 TABLET BY MOUTH DAILY AS NEEDED FOR ANXIETY /PANIC ATTACKS 02/19/21  Yes Katsadouros, Vasilios, MD  MOUNJARO 5 MG/0.5ML Pen Inject 5 mg into the skin once a week. 05/12/22  Yes [provider]  Multiple Vitamin (MULTIVITAMIN) tablet Take 1 tablet by mouth daily.   Yes [provider]  olmesartan (BENICAR) 40 MG tablet Take 1 tablet (40 mg total) by mouth every evening. 05/22/21  Yes Masters, Katie, DO  simvastatin (ZOCOR) 20 MG tablet Take 1 tablet (20 mg total) by mouth at bedtime. 05/22/21  Yes Masters, Katie, DO  traMADol (ULTRAM) 50 MG tablet Take 50 mg by mouth as needed.   Yes [provider]  Accu-Chek Softclix Lancets lancets Check blood sugar up to 3 times per day. 01/06/21   Steffanie Rainwater, MD  amoxicillin-clavulanate (AUGMENTIN) 875-125 MG tablet Take 1 tablet by mouth 2 (two) times daily. 04/03/22   Parrett, Virgel Bouquet, NP  Blood Glucose Monitoring Suppl (ACCU-CHEK GUIDE) w/Device KIT Check blood sugar up to 3 times a day 01/06/21   Steffanie Rainwater, MD  glucose blood (ACCU-CHEK GUIDE) test strip Check blood sugar 3 times per day 01/06/21   Steffanie Rainwater, MD  metFORMIN (GLUCOPHAGE) 500 MG tablet Take 500 mg by mouth daily with breakfast.    [provider]    Family  History Family History  Problem Relation Age of Onset   Heart disease Mother 21       CAD   Breast cancer Mother    Cancer Mother 29       Breast cancer, mother   Cancer Father 86       colon cancer dx'ed age 53   Diabetes Father    Coronary artery disease Other    Multiple sclerosis Daughter    Diabetes Brother    Diabetes Brother     Social History Social History   Tobacco Use   Smoking status: Never   Smokeless tobacco: Never  Vaping Use   Vaping Use: Never used  Substance Use Topics   Alcohol use: No    Alcohol/week: 0.0 standard drinks of alcohol   Drug use: No     Allergies   Atorvastatin, Hctz [hydrochlorothiazide], Norvasc [amlodipine besylate], Other, and Rosuvastatin   Review of Systems Review of Systems Per HPI  Physical Exam Triage Vital Signs ED Triage Vitals  Enc Vitals Group     BP 07/04/22 1017 (!) 150/83     Pulse Rate 07/04/22 1017 84     Resp 07/04/22 1017 18     Temp 07/04/22 1017 98.3 F (36.8 C)     Temp Source 07/04/22 1017 Oral     SpO2 07/04/22 1017 97 %     Weight 07/04/22 1016 215 lb (97.5 kg)     Height 07/04/22 1016 5\' 8"  (1.727 m)     Head Circumference --      Peak Flow --      Pain Score 07/04/22 1013 8     Pain Loc --      Pain Edu? --      Excl. in GC? --    No data found.  Updated Vital Signs BP (!) 150/83 (BP Location: Right Arm)   Pulse 84   Temp 98.3 F (36.8 C) (Oral)   Resp 18   Ht 5\' 8"  (1.727 m)   Wt 215 lb (97.5 kg)   LMP 12/31/1968   SpO2 97%   BMI 32.69 kg/m   Visual Acuity Right Eye Distance:   Left Eye Distance:   Bilateral Distance:    Right Eye Near:   Left Eye Near:    Bilateral Near:     Physical Exam  Vitals and nursing note reviewed.  Constitutional:      Appearance: She is not ill-appearing or toxic-appearing.  HENT:     Head: Normocephalic and atraumatic.     Right Ear: Hearing, tympanic membrane, ear canal and external ear normal.     Left Ear: Hearing, tympanic membrane,  ear canal and external ear normal.     Nose: Congestion and rhinorrhea present. Rhinorrhea is clear.     Right Turbinates: Swollen and pale.     Left Turbinates: Swollen and pale.     Mouth/Throat:     Lips: Pink.     Mouth: Mucous membranes are moist. No injury.     Tongue: No lesions. Tongue does not deviate from midline.     Palate: No mass and lesions.     Pharynx: Oropharynx is clear. Uvula midline. No pharyngeal swelling, oropharyngeal exudate, posterior oropharyngeal erythema or uvula swelling.     Tonsils: No tonsillar exudate or tonsillar abscesses.  Eyes:     General: Lids are normal. Vision grossly intact. Gaze aligned appropriately.        Right eye: Discharge present.        Left eye: Discharge present.    Extraocular Movements: Extraocular movements intact.     Conjunctiva/sclera: Conjunctivae normal.     Pupils: Pupils are equal, round, and reactive to light.     Comments: EOMs intact without pain or dizziness elicited.  Clear discharge present from both eyes.  Cardiovascular:     Rate and Rhythm: Normal rate and regular rhythm.     Heart sounds: Normal heart sounds, S1 normal and S2 normal.  Pulmonary:     Effort: Pulmonary effort is normal. No respiratory distress.     Breath sounds: Normal breath sounds and air entry. No stridor. No wheezing, rhonchi or rales.  Chest:     Chest wall: No tenderness.  Musculoskeletal:     Cervical back: Neck supple.     Right lower leg: No edema.     Left lower leg: No edema.  Lymphadenopathy:     Cervical: No cervical adenopathy.  Skin:    General: Skin is warm and dry.     Capillary Refill: Capillary refill takes less than 2 seconds.     Findings: No rash.  Neurological:     General: No focal deficit present.     Mental Status: She is alert and oriented to person, place, and time. Mental status is at baseline.     Cranial Nerves: No dysarthria or facial asymmetry.     Motor: No weakness.     Coordination: Coordination  normal.     Gait: Gait normal.  Psychiatric:        Mood and Affect: Mood normal.        Speech: Speech normal.        Behavior: Behavior normal.        Thought Content: Thought content normal.        Judgment: Judgment normal.      UC Treatments / Results  Labs (all labs ordered are listed, but only abnormal results are displayed) Labs Reviewed - No data to display  EKG   Radiology No results found.  Procedures Procedures (including critical care time)  Medications Ordered in UC Medications - No data to display  Initial Impression / Assessment and Plan / UC Course  I have reviewed the triage vital signs and the nursing notes.  Pertinent labs & imaging results that were available during my  care of the patient were reviewed by me and considered in my medical decision making (see chart for details).   1.  Allergic conjunctivitis and rhinitis Presentation is consistent with allergic rhinitis and allergic conjunctivitis.  Reviewed previous blood work from June 2023 to calculate creatinine clearance.  Creatinine clearance is 39 based off of blood work from June 2023 indicating patient may have Claritin without dosage adjustment.  Advised her to purchase Claritin, Flonase, and olopatadine eyedrops over-the-counter and use for symptomatic relief.  Tessalon Perles sent to pharmacy to be taken as needed for cough.  Lungs clear, therefore deferred imaging.  No signs of asthma exacerbation currently, therefore deferred prednisone prescription.  May continue using albuterol inhaler as needed every 4-6 hours for cough, shortness of breath, and wheeze.  PCP follow-up recommended should interventions provided today urgent care fail to improve symptoms in the next 1 to 2 weeks.   Discussed physical exam and available lab work findings in clinic with patient.  Counseled patient regarding appropriate use of medications and potential side effects for all medications recommended or prescribed today.  Discussed red flag signs and symptoms of worsening condition,when to call the PCP office, return to urgent care, and when to seek higher level of care in the emergency department. Patient verbalizes understanding and agreement with plan. All questions answered. Patient discharged in stable condition.    Final Clinical Impressions(s) / UC Diagnoses   Final diagnoses:  Allergic conjunctivitis and rhinitis, bilateral     Discharge Instructions      Your symptoms are likely due to environmental allergies.  Avoid exposure to allergens. Take oral antihistamine Claritin (loratadine) and use Flonase nasal spray daily as directed. You can buy these medications over the counter. These medications can take a few days to fully kick in to your body and start working. Return for any new or worsening symptoms.  If your eyes become itchy, you may purchase olopatadine (Pataday) eyedrops over the counter and use as directed to relieve watery/itchy eyes associated with allergies as well.   Tessalon perles every 8 hours as needed for cough.  If your symptoms are severe, please go to the emergency room for further evaluation. Schedule an appointment with your primary care provider for follow-up and further management of your seasonal allergies as well as ongoing preventive healthcare. I hope you feel better!     ED Prescriptions     Medication Sig Dispense Auth. Provider   benzonatate (TESSALON) 100 MG capsule Take 1 capsule (100 mg total) by mouth every 8 (eight) hours. 21 capsule Carlisle Beers, FNP      PDMP not reviewed this encounter.   Carlisle Beers, [place] 07/04/22 1052

## 2022-07-06 ENCOUNTER — Encounter: Payer: Self-pay | Admitting: Adult Health

## 2022-07-06 ENCOUNTER — Ambulatory Visit: Payer: Medicare Other | Admitting: Adult Health

## 2022-07-06 VITALS — BP 130/76 | HR 78 | Temp 98.5°F | Ht 68.0 in | Wt 225.8 lb

## 2022-07-06 DIAGNOSIS — R0602 Shortness of breath: Secondary | ICD-10-CM

## 2022-07-06 DIAGNOSIS — J0191 Acute recurrent sinusitis, unspecified: Secondary | ICD-10-CM

## 2022-07-06 DIAGNOSIS — J310 Chronic rhinitis: Secondary | ICD-10-CM

## 2022-07-06 LAB — POC COVID19 BINAXNOW: SARS Coronavirus 2 Ag: NEGATIVE

## 2022-07-06 MED ORDER — PREDNISONE 20 MG PO TABS
20.0000 mg | ORAL_TABLET | Freq: Every day | ORAL | 0 refills | Status: DC
Start: 2022-07-06 — End: 2023-04-12

## 2022-07-06 MED ORDER — AZITHROMYCIN 250 MG PO TABS: ORAL_TABLET | ORAL | 0 refills | Status: AC

## 2022-07-06 MED ORDER — AZELASTINE HCL 0.1 % NA SOLN: 2.0000 | Freq: Every evening | NASAL | 5 refills | Status: DC | PRN

## 2022-07-06 NOTE — Assessment & Plan Note (Signed)
[facility]   Plan  Patient Instructions  Zpack take as directed  Prednisone 20mg  daily for 5 days.  Begin Zyrtec 10mg  At bedtime for 2 weeks and then As needed   Saline nasal spray/rinse  Twice daily  .  Continue on Flonase 2 puffs daily As needed nasal congestion .  Try Astelin Nasal 2 puffs At bedtime  for 2 weeks and then As needed   Albuterol inhaler or neb As needed   Delsym 2 tsp Twice daily  As needed  cough  Tessalon Three times a day  As needed   Follow up with Dr. Vassie Loll  Or Keisuke Hollabaugh NP in 3 months and As needed   Please contact office for sooner follow up if symptoms do not improve or worsen or seek emergency care

## 2022-07-06 NOTE — Assessment & Plan Note (Signed)
COVID-19 testing today in the office is negative.  Appears to have an acute URI/sinusitis.  Plan  Patient Instructions  Zpack take as directed  Prednisone 20mg  daily for 5 days.  Begin Zyrtec 10mg  At bedtime for 2 weeks and then As needed   Saline nasal spray/rinse  Twice daily  .  Continue on Flonase 2 puffs daily As needed nasal congestion .  Try Astelin Nasal 2 puffs At bedtime  for 2 weeks and then As needed   Albuterol inhaler or neb As needed   Delsym 2 tsp Twice daily  As needed  cough  Tessalon Three times a day  As needed   Follow up with Dr. Vassie Loll  Or Danene Montijo NP in 3 months and As needed   Please contact office for sooner follow up if symptoms do not improve or worsen or seek emergency care

## 2022-07-06 NOTE — Patient Instructions (Addendum)
Zpack take as directed  Prednisone 20mg  daily for 5 days.  Begin Zyrtec 10mg  At bedtime for 2 weeks and then As needed   Saline nasal spray/rinse  Twice daily  .  Continue on Flonase 2 puffs daily As needed nasal congestion .  Try Astelin Nasal 2 puffs At bedtime  for 2 weeks and then As needed   Albuterol inhaler or neb As needed   Delsym 2 tsp Twice daily  As needed  cough  Tessalon Three times a day  As needed   Follow up with Dr. Vassie Loll  Or Blakleigh Straw NP in 3 months and As needed   Please contact office for sooner follow up if symptoms do not improve or worsen or seek emergency care

## 2022-07-06 NOTE — Progress Notes (Signed)
@Patient  ID: [name], female    DOB: [birth year], 72 y.o.   MRN: [id number]  Chief Complaint  Patient presents with   Follow-up    Referring provider: Loura Back, NP  HPI: 72 year old female followed for cough variant asthma Medical history significant for very mild sleep apnea CPAP intolerant, breast cancer status post chemo and radiation, diastolic heart failure  TEST/EVENTS : Methacholine challenge 11/2007 drop in smaller airways >> added singulair    spirometry 11/2007 wnl & 11/2010 -no obstruction.   Head CT 9/12  Ethmoid sinus mucosal thickening and bubbly opacity in the left sphenoid   PSG 06/2012  mild OSA -  she felt very claustrophobic, could not afford the CPAP    Exhaled nitric oxide testing was elevated at 73.   -Tried on QVAR with no perceived benefit. No perceived benefit with Singulair .   07/06/2022 Follow up : Cough variant asthma  Patient presents for a 49-month follow-up.  Patient is followed for cough variant asthma.  She complains over the last 4 days that she has had itchy watery eyes, nasal congestion, nasal drainage, sinus pain and pressure, teeth pain, loss of taste.  She does have some cough with clear mucus.  Patient went to urgent care and was given Tessalon Perles.  Chest x-ray in January showed clear lungs.  She was treated for an asthmatic bronchitic exacerbation with probable sinusitis in January.  She says her symptoms totally resolved.  She has been doing well up into the last.  She says her symptoms totally resolved.  She has been doing well up into the last few days.  She denies any hemoptysis, chest pain, orthopnea. Covid 19 test in office today is negative.   Allergies  Allergen Reactions   Atorvastatin Nausea Only    REACTION: Nausea, abdominal pain, and reflux. Pt has used the medication in the past and experienced the same reaction before the medication was discontinued.   Hctz [Hydrochlorothiazide]     Blurry vision   Norvasc  [Amlodipine Besylate]     Blurry vision   Other Other (See Comments)   Rosuvastatin Itching    Immunization History  Administered Date(s) Administered   Fluad Quad(high Dose 65+) 12/19/2021   Hepatitis A, Adult 09/27/2017   Hepatitis B, ADULT 09/27/2017, 11/01/2017   Hepb-cpg 03/31/2018   Influenza Split 12/07/2017   Influenza Whole 02/01/2006, 01/21/2007, 12/30/2007, 02/20/2009, 12/30/2009, 12/22/2010, 11/27/2011   Influenza, High Dose Seasonal PF 12/06/2020   Influenza,inj,Quad PF,6+ Mos 12/07/2012, 11/29/2013, 12/17/2014, 11/11/2015, 01/04/2017, 12/01/2018   Moderna Covid-19 Vaccine Bivalent Booster 94yrs & up 12/06/2020   Moderna SARS-COV2 Booster Vaccination 06/16/2019, 07/21/2019, 11/20/2019   PFIZER(Purple Top)SARS-COV-2 Vaccination 07/14/2021   Pneumococcal Conjugate-13 01/18/2014   Pneumococcal Polysaccharide-23 02/21/2004, 03/23/2010, 05/29/2015, 07/14/2021   Td 02/20/2002   Tdap 06/14/2012   Zoster Recombinat (Shingrix) 12/08/2018    Past Medical History:  Diagnosis Date   Anemia    Anxiety    Arthritis    DJD, lumbar spondylosis   Asthma    Back pain    Bilateral renal cysts    Breast cancer    left breast invasive ductal ca in remission as of 10/07/11 s/p double mastectomy   Breast cancer of upper-outer quadrant of left female breast 01/15/2010   Per Dr. Milta Deiters last clinic note 04/2012:  stage II a invasive ductal carcinoma of the left breast diagnosed in October 2011. She was found to have a ER/PR positive HER-2/neu positive breast cancer.She underwent bilateral mastectomies. Her right  mastectomy was an elective prophylactic mastectomy due to her on comfort. Patient subsequently underwent adjuvant chemotherapy consisting of Taxotere carbop   Bronchitis 03/10/2012   Cataract    Chemotherapy follow-up examination 03/06/2011   Colon polyps    Cortical age-related cataract, bilateral 04/13/2016   Cough 03/06/2011   Depression    seasonal melancholia   E. coli UTI     Edema extremities    GERD (gastroesophageal reflux disease)    History of migraine headaches    Hyperlipidemia    Hypertension    Ileitis    Kidney stones    non obstructive    Myopathy    not statin related, cause unknown    Posterior vitreous detachment of right eye 04/13/2016    Tobacco History: Social History   Tobacco Use  Smoking Status Never  Smokeless Tobacco Never   Counseling given: Not Answered   Outpatient Medications Prior to Visit  Medication Sig Dispense Refill   Accu-Chek Softclix Lancets lancets Check blood sugar up to 3 times per day. 100 each 11   albuterol (PROAIR HFA) 108 (90 Base) MCG/ACT inhaler Inhale 1-2 puffs into the lungs every 6 (six) hours as needed for wheezing or shortness of breath. 6.7 g 11   albuterol (PROVENTIL) (2.5 MG/3ML) 0.083% nebulizer solution Take 3 mLs (2.5 mg total) by nebulization every 6 (six) hours as needed for wheezing or shortness of breath. 75 mL 6   Blood Glucose Monitoring Suppl (ACCU-CHEK GUIDE) w/Device KIT Check blood sugar up to 3 times a day 1 kit 1   carvedilol (COREG) 25 MG tablet Take 1 tablet (25 mg total) by mouth 2 (two) times daily with a meal. 180 tablet 3   cholecalciferol (VITAMIN D) 1000 UNITS tablet Take 1,000 Units by mouth daily.     dextromethorphan-guaiFENesin (MUCINEX DM) 30-600 MG 12hr tablet Take 1 tablet by mouth 2 (two) times daily as needed for cough.     diclofenac Sodium (VOLTAREN) 1 % GEL APPLY 4 GRAMS TOPICALLY 4  TIMES DAILY AS NEEDED 100 g 0   fluticasone (FLONASE) 50 MCG/ACT nasal spray USE 2 SPRAYS IN BOTH  NOSTRILS DAILY 16 g 3   gabapentin (NEURONTIN) 100 MG capsule Take 1 capsule (100 mg total) by mouth at bedtime. 90 capsule 0   glucose blood (ACCU-CHEK GUIDE) test strip Check blood sugar 3 times per day 100 each 12   hydrALAZINE (APRESOLINE) 25 MG tablet Take 75 mg by mouth 2 (two) times daily.     hydrocortisone (PROCTOZONE-HC) 2.5 % rectal cream Place 1 Application rectally as  needed for hemorrhoids or anal itching.     LORazepam (ATIVAN) 1 MG tablet TAKE 1 TABLET BY MOUTH DAILY AS NEEDED FOR ANXIETY /PANIC ATTACKS 10 tablet 0   metFORMIN (GLUCOPHAGE) 500 MG tablet Take 500 mg by mouth daily with breakfast.     MOUNJARO 5 MG/0.5ML Pen Inject 5 mg into the skin once a week.     Multiple Vitamin (MULTIVITAMIN) tablet Take 1 tablet by mouth daily.     olmesartan (BENICAR) 40 MG tablet Take 1 tablet (40 mg total) by mouth every evening. 90 tablet 3   simvastatin (ZOCOR) 20 MG tablet Take 1 tablet (20 mg total) by mouth at bedtime. 90 tablet 3   traMADol (ULTRAM) 50 MG tablet Take 50 mg by mouth as needed.     amoxicillin-clavulanate (AUGMENTIN) 875-125 MG tablet Take 1 tablet by mouth 2 (two) times daily. (Patient not taking: Reported on 07/06/2022) 20 tablet  0   benzonatate (TESSALON) 100 MG capsule Take 1 capsule (100 mg total) by mouth every 8 (eight) hours. (Patient not taking: Reported on 07/06/2022) 21 capsule 0   No facility-administered medications prior to visit.     Review of Systems:   Constitutional:   No  weight loss, night sweats,  Fevers, chills,  +fatigue, or  lassitude.  HEENT:   No headaches,  Difficulty swallowing,   or  Sore throat,                No sneezing, itching, ear ache, + nasal congestion, post nasal drip,   CV:  No chest pain,  Orthopnea, PND, swelling in lower extremities, anasarca, dizziness, palpitations, syncope.   GI  No heartburn, indigestion, abdominal pain, nausea, vomiting, diarrhea, change in bowel habits, loss of appetite, bloody stools.   Resp:  No chest wall deformity  Skin: no rash or lesions.  GU: no dysuria, change in color of urine, no urgency or frequency.  No flank pain, no hematuria   MS:  No joint pain or swelling.  No decreased range of motion.  No back pain.    Physical Exam  BP 130/76 (BP Location: Right Arm, Patient Position: Sitting, Cuff Size: Large)   Pulse 78   Temp 98.5 F (36.9 C) (Oral)    Ht  (1.727 m)   Wt 225 lb 12.8 oz (102.4 kg)   LMP 12/31/1968   SpO2 99%   BMI 34.33 kg/m   GEN: A/Ox3; pleasant , NAD, well nourished    HEENT:  [place]/AT,  , NOSE-clear, THROAT-clear drainage , no lesions, no postnasal drip or exudate noted. Maxillary sinus tenderness   NECK:  Supple w/ fair ROM; no JVD; normal carotid impulses w/o bruits; no thyromegaly or nodules palpated; no lymphadenopathy.    RESP  Clear  P & A; w/o, wheezes/ rales/ or rhonchi. no accessory muscle use, no dullness to percussion  CARD:  RRR, no m/r/g, no peripheral edema, pulses intact, no cyanosis or clubbing.  GI:   Soft & nt; nml bowel sounds; no organomegaly or masses detected.   Musco: Warm bil, no deformities or joint swelling noted.   Neuro: alert, no focal deficits noted.    Skin: Warm, no lesions or rashes    Lab Results:     BNP    Imaging: No results found.        No data to display          Lab Results  Component Value Date   NITRICOXIDE 73 04/08/2018        Assessment & Plan:   Acute sinusitis COVID-19 testing today in the office is negative.  Appears to have an acute URI/sinusitis.  Plan  Patient Instructions  Zpack take as directed  Prednisone  daily for 5 days.  Begin Zyrtec  At bedtime for 2 weeks and then As needed   Saline nasal spray/rinse  Twice daily  .  Continue on Flonase 2 puffs daily As needed nasal congestion .  Try Astelin Nasal 2 puffs At bedtime  for 2 weeks and then As needed   Albuterol inhaler or neb As needed   Delsym 2 tsp Twice daily  As needed  cough  Tessalon Three times a day  As needed   Follow up with Dr. Vassie Loll  Or Djeneba Barsch NP in 3 months and As needed   Please contact office for sooner follow up if symptoms do not improve or worsen or seek emergency  care     Chronic rhinitis Flare   Plan  Patient Instructions  Zpack take as directed  Prednisone  daily for 5 days.  Begin Zyrtec  At bedtime for 2 weeks and  then As needed   Saline nasal spray/rinse  Twice daily  .  Continue on Flonase 2 puffs daily As needed nasal congestion .  Try Astelin Nasal 2 puffs At bedtime  for 2 weeks and then As needed   Albuterol inhaler or neb As needed   Delsym 2 tsp Twice daily  As needed  cough  Tessalon Three times a day  As needed   Follow up with Dr. Vassie Loll  Or Aleysha Meckler NP in 3 months and As needed   Please contact office for sooner follow up if symptoms do not improve or worsen or seek emergency care       Rubye Oaks, NP 07/06/2022

## 2022-07-25 ENCOUNTER — Encounter (HOSPITAL_COMMUNITY): Payer: Self-pay

## 2022-07-25 ENCOUNTER — Emergency Department (HOSPITAL_COMMUNITY)
Admission: EM | Admit: 2022-07-25 | Discharge: 2022-07-25 | Discharge status: Against Medical Advice | Payer: Medicare Other | Attending: Emergency Medicine | Admitting: Emergency Medicine

## 2022-07-25 DIAGNOSIS — Z5321 Procedure and treatment not carried out due to patient leaving prior to being seen by health care provider: Secondary | ICD-10-CM | POA: Diagnosis not present

## 2022-07-25 DIAGNOSIS — R109 Unspecified abdominal pain: Secondary | ICD-10-CM | POA: Diagnosis present

## 2022-07-25 LAB — URINALYSIS, ROUTINE W REFLEX MICROSCOPIC
Bilirubin Urine: NEGATIVE
Glucose, UA: NEGATIVE mg/dL
Hgb urine dipstick: NEGATIVE
Ketones, ur: NEGATIVE mg/dL
Nitrite: NEGATIVE
Protein, ur: NEGATIVE mg/dL
Specific Gravity, Urine: 1.006 (ref 1.005–1.030)
pH: 6 (ref 5.0–8.0)

## 2022-07-25 LAB — CBC
HCT: 32.7 % — ABNORMAL LOW (ref 36.0–46.0)
Hemoglobin: 11 g/dL — ABNORMAL LOW (ref 12.0–15.0)
MCH: 28.7 pg (ref 26.0–34.0)
MCHC: 33.6 g/dL (ref 30.0–36.0)
MCV: 85.4 fL (ref 80.0–100.0)
Platelets: 214 10*3/uL (ref 150–400)
RBC: 3.83 MIL/uL — ABNORMAL LOW (ref 3.87–5.11)
RDW: 13 % (ref 11.5–15.5)
WBC: 6.1 10*3/uL (ref 4.0–10.5)
nRBC: 0 % (ref 0.0–0.2)

## 2022-07-25 LAB — COMPREHENSIVE METABOLIC PANEL
ALT: 18 U/L (ref 0–44)
AST: 21 U/L (ref 15–41)
Albumin: 3.9 g/dL (ref 3.5–5.0)
Alkaline Phosphatase: 65 U/L (ref 38–126)
Anion gap: 13 (ref 5–15)
BUN: 18 mg/dL (ref 8–23)
CO2: 23 mmol/L (ref 22–32)
Calcium: 8.9 mg/dL (ref 8.9–10.3)
Chloride: 103 mmol/L (ref 98–111)
Creatinine, Ser: 1.84 mg/dL — ABNORMAL HIGH (ref 0.44–1.00)
GFR, Estimated: 29 mL/min — ABNORMAL LOW (ref >60)
Glucose, Bld: 125 mg/dL — ABNORMAL HIGH (ref 70–99)
Potassium: 3.6 mmol/L (ref 3.5–5.1)
Sodium: 139 mmol/L (ref 135–145)
Total Bilirubin: 0.5 mg/dL (ref 0.3–1.2)
Total Protein: 6.8 g/dL (ref 6.5–8.1)

## 2022-07-25 LAB — LIPASE, BLOOD: Lipase: 66 U/L — ABNORMAL HIGH (ref 11–51)

## 2022-07-25 NOTE — ED Notes (Signed)
Patient left on own accord °

## 2022-07-25 NOTE — ED Triage Notes (Signed)
Pt states that she is having abd pain since midnight, feels like she needs to pass gas and cannot, denies n/v/d.

## 2022-09-03 ENCOUNTER — Emergency Department (HOSPITAL_BASED_OUTPATIENT_CLINIC_OR_DEPARTMENT_OTHER): Payer: Medicare Other | Admitting: Radiology

## 2022-09-03 ENCOUNTER — Emergency Department (HOSPITAL_BASED_OUTPATIENT_CLINIC_OR_DEPARTMENT_OTHER)
Admission: EM | Admit: 2022-09-03 | Discharge: 2022-09-04 | Disposition: A | Discharge status: Home / Self Care | Payer: Medicare Other | Attending: Emergency Medicine | Admitting: Emergency Medicine

## 2022-09-03 ENCOUNTER — Other Ambulatory Visit: Payer: Self-pay

## 2022-09-03 ENCOUNTER — Encounter (HOSPITAL_BASED_OUTPATIENT_CLINIC_OR_DEPARTMENT_OTHER): Payer: Self-pay

## 2022-09-03 DIAGNOSIS — Z794 Long term (current) use of insulin: Secondary | ICD-10-CM | POA: Insufficient documentation

## 2022-09-03 DIAGNOSIS — Z79899 Other long term (current) drug therapy: Secondary | ICD-10-CM | POA: Insufficient documentation

## 2022-09-03 DIAGNOSIS — R42 Dizziness and giddiness: Secondary | ICD-10-CM | POA: Diagnosis not present

## 2022-09-03 DIAGNOSIS — Z853 Personal history of malignant neoplasm of breast: Secondary | ICD-10-CM | POA: Insufficient documentation

## 2022-09-03 DIAGNOSIS — E119 Type 2 diabetes mellitus without complications: Secondary | ICD-10-CM | POA: Diagnosis not present

## 2022-09-03 DIAGNOSIS — I509 Heart failure, unspecified: Secondary | ICD-10-CM | POA: Insufficient documentation

## 2022-09-03 DIAGNOSIS — I447 Left bundle-branch block, unspecified: Secondary | ICD-10-CM

## 2022-09-03 DIAGNOSIS — Z7984 Long term (current) use of oral hypoglycemic drugs: Secondary | ICD-10-CM | POA: Insufficient documentation

## 2022-09-03 DIAGNOSIS — I110 Hypertensive heart disease with heart failure: Secondary | ICD-10-CM | POA: Insufficient documentation

## 2022-09-03 DIAGNOSIS — R002 Palpitations: Secondary | ICD-10-CM

## 2022-09-03 LAB — BASIC METABOLIC PANEL
Anion gap: 11 (ref 5–15)
BUN: 21 mg/dL (ref 8–23)
CO2: 26 mmol/L (ref 22–32)
Calcium: 9.5 mg/dL (ref 8.9–10.3)
Chloride: 104 mmol/L (ref 98–111)
Creatinine, Ser: 1.91 mg/dL — ABNORMAL HIGH (ref 0.44–1.00)
GFR, Estimated: 27 mL/min — ABNORMAL LOW (ref >60)
Glucose, Bld: 158 mg/dL — ABNORMAL HIGH (ref 70–99)
Potassium: 3.8 mmol/L (ref 3.5–5.1)
Sodium: 141 mmol/L (ref 135–145)

## 2022-09-03 LAB — TROPONIN I (HIGH SENSITIVITY): Troponin I (High Sensitivity): 4 ng/L (ref <18)

## 2022-09-03 LAB — CBC
HCT: 32.7 % — ABNORMAL LOW (ref 36.0–46.0)
Hemoglobin: 11 g/dL — ABNORMAL LOW (ref 12.0–15.0)
MCH: 28.3 pg (ref 26.0–34.0)
MCHC: 33.6 g/dL (ref 30.0–36.0)
MCV: 84.1 fL (ref 80.0–100.0)
Platelets: 214 10*3/uL (ref 150–400)
RBC: 3.89 MIL/uL (ref 3.87–5.11)
RDW: 13.5 % (ref 11.5–15.5)
WBC: 6 10*3/uL (ref 4.0–10.5)
nRBC: 0 % (ref 0.0–0.2)

## 2022-09-03 LAB — MAGNESIUM: Magnesium: 1.9 mg/dL (ref 1.7–2.4)

## 2022-09-03 MED ORDER — SODIUM CHLORIDE 0.9 % IV BOLUS
500.0000 mL | Freq: Once | INTRAVENOUS | Status: AC
  Administered 2022-09-03: 500 mL via INTRAVENOUS

## 2022-09-03 NOTE — ED Provider Notes (Signed)
[place] EMERGENCY DEPARTMENT AT [facility] Provider Note   CSN: [id number] Arrival date & time: 09/03/22  2116     History  Chief Complaint  Patient presents with   Palpitations    [name] is a 72 y.o. female.  Patient is a 72 year old female with a past medical history of hypertension, diabetes, CHF, prior breast cancer now in remission presenting to the emergency department with heart palpitations.  Patient states around 7:30 PM she was sitting at rest when she suddenly felt lightheaded and dizzy and felt like a bright light was shining in her eyes.  She states that lasted for few seconds and then was followed by heart palpitations.  She states that she felt like her heart pauses and was skipping beats and has been continuing to feel like it skipping beats since then.  She denies any associated chest pain or shortness of breath.  She denies any associated numbness or weakness.  She had some mild nausea but denies any vomiting.  The history is provided by the patient and a relative.  Palpitations      Home Medications Prior to Admission medications   Medication Sig Start Date End Date Taking? Authorizing Provider  Accu-Chek Softclix Lancets lancets Check blood sugar up to 3 times per day. 01/06/21   Steffanie Rainwater, MD  albuterol (PROAIR HFA) 108 (90 Base) MCG/ACT inhaler Inhale 1-2 puffs into the lungs every 6 (six) hours as needed for wheezing or shortness of breath. 02/18/21   Dolan Amen, MD  albuterol (PROVENTIL) (2.5 MG/3ML) 0.083% nebulizer solution Take 3 mLs (2.5 mg total) by nebulization every 6 (six) hours as needed for wheezing or shortness of breath. 04/03/22   Parrett, Virgel Bouquet, NP  amoxicillin-clavulanate (AUGMENTIN) 875-125 MG tablet Take 1 tablet by mouth 2 (two) times daily. Patient not taking: Reported on 07/06/2022 04/03/22   Parrett, Virgel Bouquet, NP  azelastine (ASTELIN) 0.1 % nasal spray Place 2 sprays into both nostrils at bedtime as  needed for rhinitis. Use in each nostril as directed 07/06/22   Parrett, Tammy S, NP  benzonatate (TESSALON) 100 MG capsule Take 1 capsule (100 mg total) by mouth every 8 (eight) hours. Patient not taking: Reported on 07/06/2022 07/04/22   Carlisle Beers, FNP  Blood Glucose Monitoring Suppl (ACCU-CHEK GUIDE) w/Device KIT Check blood sugar up to 3 times a day 01/06/21   Steffanie Rainwater, MD  carvedilol (COREG) 25 MG tablet Take 1 tablet (25 mg total) by mouth 2 (two) times daily with a meal. 05/22/21   Masters, Florentina Addison, DO  cholecalciferol (VITAMIN D) 1000 UNITS tablet Take 1,000 Units by mouth daily.    [provider]  dextromethorphan-guaiFENesin (MUCINEX DM) 30-600 MG 12hr tablet Take 1 tablet by mouth 2 (two) times daily as needed for cough.    [provider]  diclofenac Sodium (VOLTAREN) 1 % GEL APPLY 4 GRAMS TOPICALLY 4  TIMES DAILY AS NEEDED 02/17/21   Dolan Amen, MD  fluticasone [facility]) 50 MCG/ACT nasal spray USE 2 SPRAYS IN BOTH  NOSTRILS DAILY 05/22/21   Masters, Florentina Addison, DO  gabapentin (NEURONTIN) 100 MG capsule Take 1 capsule (100 mg total) by mouth at bedtime. 09/05/20   Vivi Barrack, DPM  glucose blood (ACCU-CHEK GUIDE) test strip Check blood sugar 3 times per day 01/06/21   Steffanie Rainwater, MD  hydrALAZINE (APRESOLINE) 25 MG tablet Take 75 mg by mouth 2 (two) times daily.    [provider]  hydrocortisone (PROCTOZONE-HC)  2.5 % rectal cream Place 1 Application rectally as needed for hemorrhoids or anal itching.    [provider]  LORazepam (ATIVAN) 1 MG tablet TAKE 1 TABLET BY MOUTH DAILY AS NEEDED FOR ANXIETY /PANIC ATTACKS 02/19/21   Katsadouros, Vasilios, MD  metFORMIN (GLUCOPHAGE) 500 MG tablet Take 500 mg by mouth daily with breakfast.    [provider]  MOUNJARO 5 MG/0.5ML Pen Inject 5 mg into the skin once a week. 05/12/22   [provider]  Multiple Vitamin (MULTIVITAMIN) tablet Take 1 tablet by mouth daily.     [provider]  olmesartan (BENICAR) 40 MG tablet Take 1 tablet (40 mg total) by mouth every evening. 05/22/21   Masters, Florentina Addison, DO  predniSONE (DELTASONE) 20 MG tablet Take 1 tablet (20 mg total) by mouth daily with breakfast. 07/06/22   Parrett, Virgel Bouquet, NP  simvastatin (ZOCOR) 20 MG tablet Take 1 tablet (20 mg total) by mouth at bedtime. 05/22/21   Masters, Katie, DO  traMADol (ULTRAM) 50 MG tablet Take 50 mg by mouth as needed.    [provider]      Allergies    Atorvastatin, Hctz [hydrochlorothiazide], Norvasc [amlodipine besylate], Other, and Rosuvastatin    Review of Systems   Review of Systems  Cardiovascular:  Positive for palpitations.    Physical Exam Updated Vital Signs BP (!) 145/78   Pulse 78   Temp 98.4 F (36.9 C) (Oral)   Resp 13   Ht 5\' 8"  (1.727 m)   Wt 98 kg   LMP 12/31/1968   SpO2 96%   BMI 32.84 kg/m  Physical Exam Vitals and nursing note reviewed.  Constitutional:      General: She is not in acute distress.    Appearance: Normal appearance.  HENT:     Head: Normocephalic and atraumatic.     Nose: Nose normal.     Mouth/Throat:     Mouth: Mucous membranes are moist.     Pharynx: Oropharynx is clear.  Eyes:     Extraocular Movements: Extraocular movements intact.     Conjunctiva/sclera: Conjunctivae normal.  Cardiovascular:     Rate and Rhythm: Normal rate and regular rhythm.     Heart sounds: Normal heart sounds.  Pulmonary:     Effort: Pulmonary effort is normal.     Breath sounds: Normal breath sounds.  Abdominal:     General: Abdomen is flat.     Palpations: Abdomen is soft.     Tenderness: There is no abdominal tenderness.  Musculoskeletal:        General: Normal range of motion.     Cervical back: Normal range of motion.     Right lower leg: No edema.     Left lower leg: No edema.  Skin:    General: Skin is warm.  Neurological:     General: No focal deficit present.     Mental Status: She is alert and oriented  to person, place, and time.     Sensory: No sensory deficit.     Motor: No weakness.  Psychiatric:        Mood and Affect: Mood normal.        Behavior: Behavior normal.     ED Results / Procedures / Treatments   Labs (all labs ordered are listed, but only abnormal results are displayed) Labs Reviewed  BASIC METABOLIC PANEL - Abnormal; Notable for the following components:      Result Value   Glucose, Bld 158 (*)  Creatinine, Ser 1.91 (*)    GFR, Estimated 27 (*)    All other components within normal limits  CBC - Abnormal; Notable for the following components:   Hemoglobin 11.0 (*)    HCT 32.7 (*)    All other components within normal limits  MAGNESIUM  TROPONIN I (HIGH SENSITIVITY)  TROPONIN I (HIGH SENSITIVITY)  TROPONIN I (HIGH SENSITIVITY)    EKG None  Radiology DG Chest 2 View  Result Date: 09/03/2022 CLINICAL DATA:  Palpitations.  Lightheadedness. EXAM: CHEST - 2 VIEW COMPARISON:  04/08/2022 FINDINGS: Normal heart size and pulmonary vascularity. No focal airspace disease or consolidation in the lungs. No blunting of costophrenic angles. No pneumothorax. Mediastinal contours appear intact. Postoperative changes in the cervical spine. IMPRESSION: No active cardiopulmonary disease. Electronically Signed   By: Burman Nieves M.D.   On: 09/03/2022 21:50    Procedures Procedures    Medications Ordered in ED Medications  sodium chloride 0.9 % bolus 500 mL (500 mLs Intravenous New Bag/Given 09/03/22 2241)    ED Course/ Medical Decision Making/ A&P Clinical Course as of 09/03/22 2338  Thu Sep 03, 2022  2338 Patient signed out to Dr. Manus Gunning pending repeat trop and reassessment. [VK]    Clinical Course User Index [VK] Rexford Maus, DO                             Medical Decision Making This patient presents to the ED with chief complaint(s) of dizziness, palpitations with pertinent past medical history of pretension, CHF, diabetes which further  complicates the presenting complaint. The complaint involves an extensive differential diagnosis and also carries with it a high risk of complications and morbidity.    The differential diagnosis includes this, arrhythmia, anemia, electrolyte abnormality, presyncope, no neurologic deficits making TIA or CVA unlikely  Additional history obtained: Additional history obtained from family Records reviewed outpatient cardiology records  ED Course and Reassessment: On patient's arrival to the emergency department she was initially hypertensive but otherwise hemodynamically stable in no acute distress.  Blood pressure improved upon my evaluation in the room.  EKG on arrival showed a new left bundle branch block was in sinus rhythm.  Patient had labs including troponin performed by triage and initial troponin was negative.  The patient will additionally have a magnesium level ordered.  Chest x-ray is without acute disease.  The patient has remained on telemetry monitoring and has stayed in sinus rhythm as far.  Patient will require repeat troponin in the setting of her new left bundle and with her symptoms starting shortly prior to arrival.  Independent labs interpretation:  The following labs were independently interpreted: Within normal range  Independent visualization of imaging: - I independently visualized the following imaging with scope of interpretation limited to determining acute life threatening conditions related to emergency care: Chest x-ray, which revealed no acute disease  Consultation: - Consulted or discussed management/test interpretation w/ external professional: N/A      Amount and/or Complexity of Data Reviewed Labs: ordered. Radiology: ordered.          Final Clinical Impression(s) / ED Diagnoses Final diagnoses:  Palpitations  LBBB (left bundle branch block)  Dizziness    Rx / DC Orders ED Discharge Orders     None         Rexford Maus,  DO 09/03/22 2338

## 2022-09-03 NOTE — ED Triage Notes (Signed)
Patient here POV from Home.  Endorses at 1930 she became lightheaded and almost noted a flash or brightness in her bilateral eyes. Some Lightheadedness throughout the day.  Noted Palpitations at approximately the same time.  No SOB. No pain.  NAD Noted during Triage. A&Ox4. GCS 15. Ambulatory.

## 2022-09-03 NOTE — Discharge Instructions (Signed)
You were seen in the emergency department for heart palpitations.  You are in a normal heart rhythm here though you did have a change in your EKG called a left bundle branch block.  You no signs of heart attack or stress on your heart and no signs of abnormal electrolytes.  You can follow-up with your cardiologist to have your symptoms rechecked and to see if you need further workup regarding your left bundle.  You should return to the emergency department if you feel like your heart is racing and does not go back on its own, you have severe chest pain, you pass out or if you have any other new or concerning symptoms.

## 2022-09-04 LAB — TROPONIN I (HIGH SENSITIVITY): Troponin I (High Sensitivity): 5 ng/L (ref <18)

## 2022-09-04 NOTE — ED Notes (Signed)
Reviewed AVS/discharge instruction with patient. Time allotted for and all questions answered. Patient is agreeable for d/c and escorted to ed exit by staff.  

## 2022-09-07 ENCOUNTER — Ambulatory Visit (HOSPITAL_COMMUNITY)
Admission: RE | Admit: 2022-09-07 | Discharge: 2022-09-07 | Disposition: A | Discharge status: Home / Self Care | Payer: Medicare Other | Source: Ambulatory Visit | Attending: Family Medicine | Admitting: Family Medicine

## 2022-09-07 ENCOUNTER — Encounter (HOSPITAL_COMMUNITY): Payer: Self-pay

## 2022-09-07 ENCOUNTER — Inpatient Hospital Stay (HOSPITAL_COMMUNITY)
Admission: RE | Admit: 2022-09-07 | Discharge: 2022-09-07 | Disposition: A | Discharge status: Home / Self Care | Payer: Medicare Other | Source: Ambulatory Visit | Attending: Internal Medicine | Admitting: Internal Medicine

## 2022-09-07 ENCOUNTER — Other Ambulatory Visit (HOSPITAL_COMMUNITY): Payer: Self-pay | Admitting: Internal Medicine

## 2022-09-07 VITALS — BP 142/82 | HR 83 | Wt 227.0 lb

## 2022-09-07 DIAGNOSIS — Z803 Family history of malignant neoplasm of breast: Secondary | ICD-10-CM | POA: Diagnosis not present

## 2022-09-07 DIAGNOSIS — R002 Palpitations: Secondary | ICD-10-CM | POA: Diagnosis not present

## 2022-09-07 DIAGNOSIS — I5032 Chronic diastolic (congestive) heart failure: Secondary | ICD-10-CM

## 2022-09-07 DIAGNOSIS — Z79899 Other long term (current) drug therapy: Secondary | ICD-10-CM | POA: Diagnosis not present

## 2022-09-07 DIAGNOSIS — I447 Left bundle-branch block, unspecified: Secondary | ICD-10-CM | POA: Diagnosis not present

## 2022-09-07 DIAGNOSIS — Z7985 Long-term (current) use of injectable non-insulin antidiabetic drugs: Secondary | ICD-10-CM | POA: Insufficient documentation

## 2022-09-07 DIAGNOSIS — I10 Essential (primary) hypertension: Secondary | ICD-10-CM

## 2022-09-07 DIAGNOSIS — R42 Dizziness and giddiness: Secondary | ICD-10-CM | POA: Diagnosis not present

## 2022-09-07 DIAGNOSIS — C50919 Malignant neoplasm of unspecified site of unspecified female breast: Secondary | ICD-10-CM | POA: Diagnosis not present

## 2022-09-07 DIAGNOSIS — J45909 Unspecified asthma, uncomplicated: Secondary | ICD-10-CM | POA: Insufficient documentation

## 2022-09-07 DIAGNOSIS — Z853 Personal history of malignant neoplasm of breast: Secondary | ICD-10-CM | POA: Insufficient documentation

## 2022-09-07 DIAGNOSIS — Z8249 Family history of ischemic heart disease and other diseases of the circulatory system: Secondary | ICD-10-CM | POA: Diagnosis not present

## 2022-09-07 DIAGNOSIS — Z833 Family history of diabetes mellitus: Secondary | ICD-10-CM | POA: Diagnosis not present

## 2022-09-07 DIAGNOSIS — E1122 Type 2 diabetes mellitus with diabetic chronic kidney disease: Secondary | ICD-10-CM | POA: Diagnosis not present

## 2022-09-07 DIAGNOSIS — G4733 Obstructive sleep apnea (adult) (pediatric): Secondary | ICD-10-CM | POA: Diagnosis not present

## 2022-09-07 DIAGNOSIS — I130 Hypertensive heart and chronic kidney disease with heart failure and stage 1 through stage 4 chronic kidney disease, or unspecified chronic kidney disease: Secondary | ICD-10-CM | POA: Diagnosis not present

## 2022-09-07 DIAGNOSIS — N1832 Chronic kidney disease, stage 3b: Secondary | ICD-10-CM | POA: Diagnosis not present

## 2022-09-07 DIAGNOSIS — R079 Chest pain, unspecified: Secondary | ICD-10-CM

## 2022-09-07 DIAGNOSIS — Z9013 Acquired absence of bilateral breasts and nipples: Secondary | ICD-10-CM | POA: Diagnosis not present

## 2022-09-07 DIAGNOSIS — I959 Hypotension, unspecified: Secondary | ICD-10-CM | POA: Insufficient documentation

## 2022-09-07 DIAGNOSIS — E119 Type 2 diabetes mellitus without complications: Secondary | ICD-10-CM

## 2022-09-07 LAB — BASIC METABOLIC PANEL
Anion gap: 7 (ref 5–15)
BUN: 16 mg/dL (ref 8–23)
CO2: 29 mmol/L (ref 22–32)
Calcium: 9.4 mg/dL (ref 8.9–10.3)
Chloride: 105 mmol/L (ref 98–111)
Creatinine, Ser: 1.43 mg/dL — ABNORMAL HIGH (ref 0.44–1.00)
GFR, Estimated: 39 mL/min — ABNORMAL LOW (ref >60)
Glucose, Bld: 84 mg/dL (ref 70–99)
Potassium: 3.8 mmol/L (ref 3.5–5.1)
Sodium: 141 mmol/L (ref 135–145)

## 2022-09-07 LAB — IRON AND TIBC
Iron: 69 ug/dL (ref 28–170)
Saturation Ratios: 22 % (ref 10.4–31.8)
TIBC: 315 ug/dL (ref 250–450)
UIBC: 246 ug/dL

## 2022-09-07 LAB — CBC
HCT: 33.7 % — ABNORMAL LOW (ref 36.0–46.0)
Hemoglobin: 11.2 g/dL — ABNORMAL LOW (ref 12.0–15.0)
MCH: 27.9 pg (ref 26.0–34.0)
MCHC: 33.2 g/dL (ref 30.0–36.0)
MCV: 83.8 fL (ref 80.0–100.0)
Platelets: 246 10*3/uL (ref 150–400)
RBC: 4.02 MIL/uL (ref 3.87–5.11)
RDW: 13.2 % (ref 11.5–15.5)
WBC: 7.1 10*3/uL (ref 4.0–10.5)
nRBC: 0 % (ref 0.0–0.2)

## 2022-09-07 LAB — FERRITIN: Ferritin: 276 ng/mL (ref 11–307)

## 2022-09-07 LAB — TSH: TSH: 1.297 u[IU]/mL (ref 0.350–4.500)

## 2022-09-07 LAB — MAGNESIUM: Magnesium: 1.9 mg/dL (ref 1.7–2.4)

## 2022-09-07 NOTE — Progress Notes (Signed)
CARDIO-ONCOLOGY CLINIC CONSULT NOTE  Patient ID: [name], female   DOB: Nov 08, 1950, 72 y.o.   MRN: [id number]  PCP: [name] Back, NP Nephrology: Dr. Malen Leach HF Cardiologist: Dr. Gala Leach   HPI: [name] is a 72 y.o.women with h/o obesity, HTN, diabetes, asthma, breast CA, CKD IIIb and diastolic HF referred Leach to [facility] by Dr. Pamelia Leach for further evaluation of recurrent CP and SOB.   Found to have L ductal breast CA. ER/PR +. HER 2 neu was equivocal. s/p bilateral mastectomies in 01/2010. Lymph nodes negative.   She completed herceptin therapy Jan. 2013.  PSG 06/2012 showed mild OSA -  she felt very claustrophobic, she could not afford the CPAP - hence abandoned  Echo 05/30/2012: EF 60-65% Grade 1 diastolic dysfunction  Myoview 84/13 LV Ejection Fraction: 68%. LV Wall Motion: NL LV Function; NL Wall Motion  Echo 05/30/20 EF 60-65% grade 2 DD  Echo 10/06/21 EF 60-65%, grade 1 DD.   Seen in ED 09/03/22 with  palpitations, had dizziness and low BP. Remains NSR on ECG, but had new LBBB.   Today she returns for HF follow up with her daughter. Overall feeling fine. Awoke last night, same sensation of palpitations. She has SOB occasionally, she can walk up steps without issues. Chronically swells in legs. Denies abnormal bleeding, CP, dizziness, or PND/Orthopnea. Appetite ok. No fever or chills. Weight at home 217 pounds. Taking all medications, failed Jardiance 2/2 UTIs. She snores, not on CPAP, last sleep study 2014.  Past Medical History:  Diagnosis Date   Anemia    Anxiety    Arthritis    DJD, lumbar spondylosis   Asthma    Leach pain    Bilateral renal cysts    Breast cancer (HCC)    left breast invasive ductal ca in remission as of 10/07/11 s/p double mastectomy   Breast cancer of upper-outer quadrant of left female breast (HCC) 01/15/2010   Per [name] last clinic note 04/2012:  stage II a invasive ductal carcinoma of the left breast diagnosed in October 2011. She was found to  have a ER/PR positive HER-2/neu positive breast cancer.She underwent bilateral mastectomies. Her right mastectomy was an elective prophylactic mastectomy due to her on comfort. Patient subsequently underwent adjuvant chemotherapy consisting of Taxotere carbop   Bronchitis 03/10/2012   Cataract    Chemotherapy follow-up examination 03/06/2011   Colon polyps    Cortical age-related cataract, bilateral 04/13/2016   Cough 03/06/2011   Depression    seasonal melancholia   E. coli UTI    Edema extremities    GERD (gastroesophageal reflux disease)    History of migraine headaches    Hyperlipidemia    Hypertension    Ileitis    Kidney stones    non obstructive    Myopathy    not statin related, cause unknown    Posterior vitreous detachment of right eye 04/13/2016   Current Outpatient Medications on File Prior to Encounter  Medication Sig Dispense Refill   Accu-Chek Softclix Lancets lancets Check blood sugar up to 3 times per day. 100 each 11   albuterol (PROAIR HFA) 108 (90 Base) MCG/ACT inhaler Inhale 1-2 puffs into the lungs every 6 (six) hours as needed for wheezing or shortness of breath. 6.7 g 11   albuterol (PROVENTIL) (2.5 MG/3ML) 0.083% nebulizer solution Take 3 mLs (2.5 mg total) by nebulization every 6 (six) hours as needed for wheezing or shortness of breath. 75 mL 6   azelastine (ASTELIN) 0.1 %  nasal spray Place 2 sprays into both nostrils at bedtime as needed for rhinitis. Use in each nostril as directed 30 mL 5   benzonatate (TESSALON) 100 MG capsule Take 1 capsule (100 mg total) by mouth every 8 (eight) hours. 21 capsule 0   Blood Glucose Monitoring Suppl (ACCU-CHEK GUIDE) w/Device KIT Check blood sugar up to 3 times a day 1 kit 1   carvedilol (COREG) 25 MG tablet Take 1 tablet (25 mg total) by mouth 2 (two) times daily with a meal. 180 tablet 3   cholecalciferol (VITAMIN D) 1000 UNITS tablet Take 1,000 Units by mouth daily.     diclofenac Sodium (VOLTAREN) 1 % GEL APPLY 4 GRAMS  TOPICALLY 4  TIMES DAILY AS NEEDED 100 g 0   fluticasone (FLONASE) 50 MCG/ACT nasal spray USE 2 SPRAYS IN BOTH  NOSTRILS DAILY 16 g 3   gabapentin (NEURONTIN) 100 MG capsule Take 1 capsule (100 mg total) by mouth at bedtime. 90 capsule 0   glucose blood (ACCU-CHEK GUIDE) test strip Check blood sugar 3 times per day 100 each 12   hydrALAZINE (APRESOLINE) 25 MG tablet Take 50 mg by mouth 2 (two) times daily.     hydrocortisone (PROCTOZONE-HC) 2.5 % rectal cream Place 1 Application rectally as needed for hemorrhoids or anal itching.     LORazepam (ATIVAN) 1 MG tablet TAKE 1 TABLET BY MOUTH DAILY AS NEEDED FOR ANXIETY /PANIC ATTACKS 10 tablet 0   MOUNJARO 5 MG/0.5ML Pen Inject 5 mg into the skin once a week.     Multiple Vitamin (MULTIVITAMIN) tablet Take 1 tablet by mouth daily.     olmesartan (BENICAR) 40 MG tablet Take 1 tablet (40 mg total) by mouth every evening. 90 tablet 3   predniSONE (DELTASONE) 20 MG tablet Take 1 tablet (20 mg total) by mouth daily with breakfast. (Patient taking differently: Take 20 mg by mouth as needed.) 5 tablet 0   simvastatin (ZOCOR) 20 MG tablet Take 1 tablet (20 mg total) by mouth at bedtime. 90 tablet 3   traMADol (ULTRAM) 50 MG tablet Take 50 mg by mouth as needed.     No current facility-administered medications on file prior to encounter.   Social History   Socioeconomic History   Marital status: Single    Spouse name: Not on file   Number of children: Not on file   Years of education: Not on file   Highest education level: Not on file  Occupational History   Not on file  Tobacco Use   Smoking status: Never   Smokeless tobacco: Never  Vaping Use   Vaping Use: Never used  Substance and Sexual Activity   Alcohol use: No    Alcohol/week: 0.0 standard drinks of alcohol   Drug use: No   Sexual activity: Not on file    Comment: Hysterectomy  Other Topics Concern   Not on file  Social History Narrative   Single, 2 kids   Never smoked   No alcohol  use   No drugs   Laid off from [name] after working there for 14 years.    Social Determinants of Health   Financial Resource Strain: Not on file  Food Insecurity: Not on file  Transportation Needs: Not on file  Physical Activity: Not on file  Stress: Not on file  Social Connections: Not on file   Family History  Problem Relation Age of Onset   Heart disease Mother 60  CAD   Breast cancer Mother    Cancer Mother 35       Breast cancer, mother   Cancer Father 41       colon cancer dx'ed age 39   Diabetes Father    Coronary artery disease Other    Multiple sclerosis Daughter    Diabetes Brother    Diabetes Brother    BP (!) 142/82   Pulse 83   Wt 103 kg (227 lb)   LMP 12/31/1968   SpO2 99%   BMI 34.52 kg/m   Wt Readings from Last 3 Encounters:  09/07/22 103 kg (227 lb)  09/03/22 98 kg (216 lb)  07/06/22 102.4 kg (225 lb 12.8 oz)   Physical Exam General:  NAD. No resp difficulty, walked into clinic HEENT: Normal Neck: Supple. No JVD. Carotids 2+ bilat; no bruits. No lymphadenopathy or thryomegaly appreciated. Cor: PMI nondisplaced. Regular rate & rhythm. No rubs, gallops or murmurs. Lungs: Clear Abdomen: Soft, obese, nontender, nondistended. No hepatosplenomegaly. No bruits or masses. Good bowel sounds. Extremities: No cyanosis, clubbing, rash, 1+ pedal edema Neuro: Alert & oriented x 3, cranial nerves grossly intact. Moves all 4 extremities w/o difficulty. Affect pleasant.  ECG (personally reviewed): NSR LBBB 76 bpm, QRS 144 msec  Assessment/Plan  Palpitations - NSR on ECG today. New LBBB, QRS 144 msec, no CP - Place 2 week Zio to quantify high grade arrhythmias. - Arrange home sleep study - Update echo - Check TSH, mag, iron panel, CBC and BMET  2. Chronic diastolic HF - Echo (3/22): EF 16-10% grade 2 DD  - Echo (7/23): EF 60-65% grade I DD - NYHA II. Volume looks good on exam.  - Did not tolerate Jardiance (yeast infection) -  Continue Coreg 25 mg bid. - Continue Lasix 20 mg MWF - Continue olmesartan 40 mg daily. - Continue hydralazine 50 mg bid. - Encouraged regular exercise.  - Already following low sodium diet - Labs today. - Update echo  3. Breast cancer - Finished chemo.  - Remains in remission. No change  4. HTN  - BP mildly elevated today, controlled at home - Continue GDMT as above  5. CP - Lexi Scan Myoview 06/2020 normal.  - No recurrent CP.  6.  DM II - A1c 6.0% 6/23 - On Mounjaro - Failed SGLT2i  7. OSA - as above, arrange home sleep study  Follow up in 6 months with Dr. Mickle Plumb FNP-BC 2:16 PM

## 2022-09-07 NOTE — Progress Notes (Signed)
Patient Name:         DOB:       Height:     Weight:  Office Name:         Referring Provider:  Today's Date:  Date:   STOP BANG RISK ASSESSMENT S (snore) Have you been told that you snore?     YES/NO   T (tired) Are you often tired, fatigued, or sleepy during the day?   YES/NO  O (obstruction) Do you stop breathing, choke, or gasp during sleep? YES/NO   P (pressure) Do you have or are you being treated for high blood pressure? YES/NO   B (BMI) Is your body index greater than 35 kg/m? YES/NO   A (age) Are you 50 years old or older? YES/NO   N (neck) Do you have a neck circumference greater than 16 inches?   YES/NO   G (gender) Are you a female? YES/NO   TOTAL STOP/BANG "YES" ANSWERS                                                                        For Office Use Only              Procedure Order Form    YES to 3+ Stop Bang questions OR two clinical symptoms - patient qualifies for WatchPAT (CPT 95800)             Clinical Notes: Will consult Sleep Specialist and refer for management of therapy due to patient increased risk of Sleep Apnea. Ordering a sleep study due to the following two clinical symptoms: Excessive daytime sleepiness G47.10 / Gastroesophageal reflux K21.9 / Nocturia R35.1 / Morning Headaches G44.221 / Difficulty concentrating R41.840 / Memory problems or poor judgment G31.84 / Personality changes or irritability R45.4 / Loud snoring R06.83 / Depression F32.9 / Unrefreshed by sleep G47.8 / Impotence N52.9 / History of high blood pressure R03.0 / Insomnia G47.00    I understand that I am proceeding with a home sleep apnea test as ordered by my treating physician. I understand that untreated sleep apnea is a serious cardiovascular risk factor and it is my responsibility to perform the test and seek management for sleep apnea. I will be contacted with the results and be managed for sleep apnea by a local sleep physician. I will be receiving equipment and  further instructions from [facility]. I shall promptly ship back the equipment via the included mailing label. I understand my insurance will be billed for the test and as the patient I am responsible for any insurance related out-of-pocket costs incurred. I have been provided with written instructions and can call for additional video or telephonic instruction, with 24-hour availability of qualified personnel to answer any questions: Patient Help Desk [phone].  Patient Signature ______________________________________________________   Date______________________ Patient Telemedicine Verbal Consent    

## 2022-09-07 NOTE — Patient Instructions (Addendum)
Thank you for coming in today  If you had labs drawn today, any labs that are abnormal the clinic will call you No news is good news  Medications: No changes  You were set up for a at home sleep study and will be told when to complete the sleep study     Your provider has recommended that  you wear a Zio Patch for 14 days.  This monitor will record your heart rhythm for our review.  IF you have any symptoms while wearing the monitor please press the button.  If you have any issues with the patch or you notice a red or orange light on it please call the company at [phone].  Once you remove the patch please mail it back to the company as soon as possible so we can get the results.   Follow up appointments:  Your physician recommends that you schedule a follow-up appointment in:  6 months With Dr. Gala Romney  You will receive a reminder letter in the mail a few months in advance. If you don't receive a letter, please call our office to schedule the follow-up appointment.     Do the following things EVERYDAY: Weigh yourself in the morning before breakfast. Write it down and keep it in a log. Take your medicines as prescribed Eat low salt foods--Limit salt (sodium) to 2000 mg per day.  Stay as active as you can everyday Limit all fluids for the day to less than 2 liters   At the [facility], you and your health needs are our priority. As part of our continuing mission to provide you with exceptional heart care, we have created designated Provider Care Teams. These Care Teams include your primary Cardiologist (physician) and Advanced Practice Providers (APPs- Physician Assistants and Nurse Practitioners) who all work together to provide you with the care you need, when you need it.   You may see any of the following providers on your designated Care Team at your next follow up: Dr Arvilla Meres Dr Marca Ancona Dr. Marcos Eke, NP Robbie Lis, [place] [facility], [place] Brynda Peon, NP Karle Plumber, PharmD   Please be sure to bring in all your medications bottles to every appointment.    Thank you for choosing [facility]  If you have any questions or concerns before your next appointment please send [place] a message through [place] or call our office at [phone].    TO LEAVE A MESSAGE FOR THE NURSE SELECT OPTION 2, PLEASE LEAVE A MESSAGE INCLUDING: YOUR NAME DATE OF BIRTH CALL BACK NUMBER REASON FOR CALL**this is important as we prioritize the call backs  YOU WILL RECEIVE A CALL BACK THE SAME DAY AS LONG AS YOU CALL BEFORE 4:00 PM

## 2022-09-08 ENCOUNTER — Telehealth (HOSPITAL_COMMUNITY): Payer: Self-pay | Admitting: Surgery

## 2022-09-08 NOTE — Telephone Encounter (Signed)
-----   Message from Modesta Messing, [place] sent at 09/08/2022  3:14 PM EDT ----- Regarding: RE: Home sleep study No pre cert reqd  ----- Message ----- From: Crissie Figures, RN Sent: 09/07/2022   3:09 PM EDT To: Modesta Messing, CMA Subject: Home sleep study                               ? Precert

## 2022-09-08 NOTE — Telephone Encounter (Signed)
I called patient to inform her to proceed with ordered home sleep study as no precert is indicated.

## 2022-09-09 ENCOUNTER — Encounter (INDEPENDENT_AMBULATORY_CARE_PROVIDER_SITE_OTHER): Payer: Medicare Other | Admitting: Cardiology

## 2022-09-09 DIAGNOSIS — G4733 Obstructive sleep apnea (adult) (pediatric): Secondary | ICD-10-CM

## 2022-09-10 ENCOUNTER — Ambulatory Visit: Payer: Medicare Other | Attending: Family Medicine

## 2022-09-10 DIAGNOSIS — G4733 Obstructive sleep apnea (adult) (pediatric): Secondary | ICD-10-CM

## 2022-09-10 NOTE — Procedures (Signed)
SLEEP STUDY REPORT Patient Information Study Date: 09/09/2022 Patient Name: [name] Patient ID: [id number] Birth Date: [birth year] Age: 72 Gender: Female BMI: 34.4 (W=227 lb, H=5' 8'') Stopbang: 4 Referring Physician: Prince Rome, NP  TEST DESCRIPTION: Home sleep apnea testing was completed using the WatchPat, a Type 1 device, utilizing peripheral arterial tonometry (PAT), chest movement, actigraphy, pulse oximetry, pulse rate, body position and snore.  AHI was calculated with apnea and hypopnea using valid sleep time as the denominator. RDI includes apneas, hypopneas, and RERAs.  The data acquired and the scoring of sleep and all associated events were performed in accordance with the recommended standards and specifications as outlined in the AASM Manual for the Scoring of Sleep and Associated Events 2.2.0 (2015).  FINDINGS:  1.  Severe Obstructive Sleep Apnea with AHI 31.9/hr.   2.  No Central Sleep Apnea with pAHIc 0.8/hr.  3.  Oxygen desaturations as low as 69%.  4.  Severe snoring was present. O2 sats were < 88% for 148.8 min.  5.  Total sleep time was 8 hrs and 12 min.  6.  33.3% of total sleep time was spent in REM sleep.   7.  Shortened sleep onset latency at 5 min.   8.  Shortened REM sleep onset latency at 84 min.   9.  Total awakenings were 12.  10. Arrhythmia detection: None.  DIAGNOSIS:   Severe Obstructive Sleep Apnea (G47.33) Nocturnal Hypoxemia  RECOMMENDATIONS:   1.  Clinical correlation of these findings is necessary.  The decision to treat obstructive sleep apnea (OSA) is usually based on the presence of apnea symptoms or the presence of associated medical conditions such as Hypertension, Congestive Heart Failure, Atrial Fibrillation or Obesity.  The most common symptoms of OSA are snoring, gasping for breath while sleeping, daytime sleepiness and fatigue.   2.  Initiating apnea therapy is recommended given the presence of symptoms  and/or associated conditions. Recommend proceeding with one of the following:     a.  Auto-CPAP therapy with a pressure range of 5-20cm H2O.     b.  An oral appliance (OA) that can be obtained from certain dentists with expertise in sleep medicine.  These are primarily of use in non-obese patients with mild and moderate disease.     c.  An ENT consultation which may be useful to look for specific causes of obstruction and possible treatment options.     d.  If patient is intolerant to PAP therapy, consider referral to ENT for evaluation for hypoglossal nerve stimulator.   3.  Close follow-up is necessary to ensure success with CPAP or oral appliance therapy for maximum benefit.  4.  A follow-up oximetry study on CPAP is recommended to assess the adequacy of therapy and determine the need for supplemental oxygen or the potential need for Bi-level therapy.  An arterial blood gas to determine the adequacy of baseline ventilation and oxygenation should also be considered.  5.  Healthy sleep recommendations include:  adequate nightly sleep (normal 7-9 hrs/night), avoidance of caffeine after noon and alcohol near bedtime, and maintaining a sleep environment that is cool, dark and quiet.  6.  Weight loss for overweight patients is recommended.  Even modest amounts of weight loss can significantly improve the severity of sleep apnea.  7.  Snoring recommendations include:  weight loss where appropriate, side sleeping, and avoidance of alcohol before bed.  8.  Operation of motor vehicle should be avoided when  sleepy.  Signature:   Armanda Magic, MD; [facility]; Diplomat, American Board of Sleep Medicine Electronically Signed: 09/10/2022 8:06:37 AM

## 2022-09-11 ENCOUNTER — Other Ambulatory Visit: Payer: Self-pay

## 2022-09-11 DIAGNOSIS — G4733 Obstructive sleep apnea (adult) (pediatric): Secondary | ICD-10-CM

## 2022-09-15 ENCOUNTER — Telehealth: Payer: Self-pay

## 2022-09-15 DIAGNOSIS — G4733 Obstructive sleep apnea (adult) (pediatric): Secondary | ICD-10-CM

## 2022-09-15 NOTE — Telephone Encounter (Signed)
Notified patient of sleep study results and recommendations. Patient stated, " I have used a CPAP machine and mask before. I do not want to wear that mask." Patient is interested in [place] device. All questions were answered, patient verbalized understanding.

## 2022-09-15 NOTE — Telephone Encounter (Signed)
-----   Message from Quintella Reichert, MD sent at 09/10/2022  8:09 AM EDT ----- Please let patient know that they have sleep apnea.  Recommend therapeutic CPAP titration for treatment of patient's sleep disordered breathing.  If unable to perform an in lab titration then initiate ResMed auto CPAP from 4 to 15cm H2O with heated humidity and mask of choice and overnight pulse ox on CPAP.

## 2022-09-21 ENCOUNTER — Ambulatory Visit (HOSPITAL_COMMUNITY)
Admission: RE | Admit: 2022-09-21 | Discharge: 2022-09-21 | Disposition: A | Discharge status: Home / Self Care | Payer: Medicare Other | Source: Ambulatory Visit | Attending: Registered Nurse | Admitting: Registered Nurse

## 2022-09-21 DIAGNOSIS — I5030 Unspecified diastolic (congestive) heart failure: Secondary | ICD-10-CM | POA: Diagnosis not present

## 2022-09-21 DIAGNOSIS — I5032 Chronic diastolic (congestive) heart failure: Secondary | ICD-10-CM | POA: Insufficient documentation

## 2022-09-21 DIAGNOSIS — I517 Cardiomegaly: Secondary | ICD-10-CM | POA: Diagnosis not present

## 2022-09-21 LAB — ECHOCARDIOGRAM COMPLETE
AR max vel: 2.11 cm2
AV Area VTI: 2.32 cm2
AV Area mean vel: 2.17 cm2
AV Mean grad: 3 mmHg
AV Peak grad: 5.5 mmHg
Ao pk vel: 1.17 m/s
Area-P 1/2: 6.54 cm2
Calc EF: 58 %
S' Lateral: 2.95 cm
Single Plane A2C EF: 57.4 %
Single Plane A4C EF: 56.7 %

## 2022-09-29 NOTE — Addendum Note (Signed)
Encounter addended by: Crissie Figures, RN on: 09/29/2022 9:44 AM  Actions taken: Imaging Exam ended

## 2022-09-30 NOTE — Telephone Encounter (Signed)
Notified patient of insurance requirements in order to qualify for Inspire Device. All questions were answered (if any), patient verbalized understanding. CPAP Titration ordered today 09/30/22.

## 2022-10-09 ENCOUNTER — Ambulatory Visit: Payer: Medicare Other | Admitting: Adult Health

## 2022-11-03 ENCOUNTER — Ambulatory Visit: Payer: Medicare Other | Admitting: Adult Health

## 2022-12-14 ENCOUNTER — Telehealth (HOSPITAL_COMMUNITY): Payer: Self-pay | Admitting: *Deleted

## 2022-12-14 NOTE — Telephone Encounter (Signed)
Pt ordered/completed 2 wk zio per Dr Cyndie Chime at [facility].  Zio worn 13 days, 11/06/22-11/20/22 Findings: min HR of 53 bpm, max HR of 133 bmp, avg HR 76 bmp. Predominant underlying rhythm was SR. Intermittent BBB was present. 1 run of SVT occurred lasting 6 beats with a max rate of 133 bpm (avg 121 bmp). Isolated SVEs were rate, SVE couplets were rare and SVE Triplets were rare. Isolated VEs were rare, no VE couplets or triplets.   Results reviewed by Dr Leory Plowman, no changes, results sent to scan center.

## 2023-01-31 ENCOUNTER — Other Ambulatory Visit: Payer: Self-pay | Admitting: Adult Health

## 2023-02-11 ENCOUNTER — Encounter: Payer: Self-pay | Admitting: Gastroenterology

## 2023-02-11 ENCOUNTER — Other Ambulatory Visit: Payer: Self-pay | Admitting: Gastroenterology

## 2023-02-11 DIAGNOSIS — R1012 Left upper quadrant pain: Secondary | ICD-10-CM

## 2023-02-17 ENCOUNTER — Other Ambulatory Visit: Payer: Self-pay | Admitting: Pulmonary Disease

## 2023-02-25 ENCOUNTER — Ambulatory Visit
Admission: RE | Admit: 2023-02-25 | Discharge: 2023-02-25 | Disposition: A | Discharge status: Home / Self Care | Payer: Medicare Other | Source: Ambulatory Visit | Attending: Gastroenterology | Admitting: Gastroenterology

## 2023-02-25 DIAGNOSIS — R1012 Left upper quadrant pain: Secondary | ICD-10-CM

## 2023-02-25 MED ORDER — IOPAMIDOL (ISOVUE-370) INJECTION 76%
60.0000 mL | Freq: Once | INTRAVENOUS | Status: AC | PRN
  Administered 2023-02-25: 60 mL via INTRAVENOUS

## 2023-03-29 ENCOUNTER — Ambulatory Visit: Payer: Medicare Other | Admitting: Primary Care

## 2023-04-12 ENCOUNTER — Inpatient Hospital Stay: Payer: Medicare Other | Attending: Hematology and Oncology | Admitting: Hematology and Oncology

## 2023-04-12 VITALS — BP 140/87 | HR 80 | Temp 97.8°F | Resp 18 | Ht 68.0 in | Wt 234.1 lb

## 2023-04-12 DIAGNOSIS — E119 Type 2 diabetes mellitus without complications: Secondary | ICD-10-CM | POA: Insufficient documentation

## 2023-04-12 DIAGNOSIS — C50412 Malignant neoplasm of upper-outer quadrant of left female breast: Secondary | ICD-10-CM | POA: Insufficient documentation

## 2023-04-12 DIAGNOSIS — Z79811 Long term (current) use of aromatase inhibitors: Secondary | ICD-10-CM | POA: Insufficient documentation

## 2023-04-12 DIAGNOSIS — Z170 Estrogen receptor positive status [ER+]: Secondary | ICD-10-CM | POA: Diagnosis not present

## 2023-04-12 DIAGNOSIS — Z9013 Acquired absence of bilateral breasts and nipples: Secondary | ICD-10-CM | POA: Diagnosis not present

## 2023-04-12 DIAGNOSIS — Z79899 Other long term (current) drug therapy: Secondary | ICD-10-CM | POA: Diagnosis not present

## 2023-04-12 DIAGNOSIS — Z7984 Long term (current) use of oral hypoglycemic drugs: Secondary | ICD-10-CM | POA: Insufficient documentation

## 2023-04-12 DIAGNOSIS — Z86718 Personal history of other venous thrombosis and embolism: Secondary | ICD-10-CM | POA: Insufficient documentation

## 2023-04-12 MED ORDER — LINAGLIPTIN 5 MG PO TABS: 5.0000 mg | ORAL_TABLET | Freq: Every day | ORAL | Status: AC

## 2023-04-12 NOTE — Telephone Encounter (Signed)
 error

## 2023-04-12 NOTE — Assessment & Plan Note (Signed)
Left breast invasive ductal carcinoma stage II a ER/PR positive HER-2 positive status post bilateral mastectomies (Right CVA, Rt IJ DVT)  Followed by 6 cycles of TCH followed by Herceptin maintenance completed 04/17/2011  Letrozole June 2012-June 2017 BCI: High risk: 10% risk of late distant recurrence, no probability of benefit from extended adjuvant therapy.   Breast Cancer Surveillance: 1. Breast exam 04/12/2023: Benign 2.  No role of imaging studies since she had bilateral mastectomies 3.  Bone density 05/24/2019: normal T score 0.1  4.  Bone scan 05/12/2020: no evidence of metastatic disease to the bone. 5.  CT abdomen and pelvis 02/25/2023: Tiny left renal calculus   Return to clinic in 1 year for follow-up

## 2023-04-12 NOTE — Progress Notes (Signed)
Patient Care Team: Loura Back, NP as PCP - General (Nurse Practitioner) Gala Romney Bevelyn Buckles, MD as Referring Physician (Cardiology) Jethro Bolus, MD as Consulting Physician (Ophthalmology)  DIAGNOSIS:  Encounter Diagnosis  Name Primary?   Malignant neoplasm of upper-outer quadrant of left breast in female, estrogen receptor positive (HCC) Yes    SUMMARY OF ONCOLOGIC HISTORY: Oncology History  Breast cancer of upper-outer quadrant of left female breast (HCC)  01/15/2010 Surgery    bilateral mastectomies: left breast IDC , ER/PR positive HER-2 positive  stage II a;  right breast benign   04/17/2010 - 04/17/2011 Chemotherapy   weekly Taxotere carboplatinum and Herceptin x 6 cycles  followed by Herceptin maintenance   09/01/2010 - 09/02/2015 Anti-estrogen oral therapy    letrozole 2.5 mg daily (BCI revealed 10%, high risk of recurrence but low probability of benefit from extended adjuvant therapy)     CHIEF COMPLIANT: Surveillance of breast cancer  HISTORY OF PRESENT ILLNESS:  History of Present Illness   The patient, with a history of cancer and diabetes, presents for a routine follow-up. The patient has been off chemotherapy and reports numbness in the toes, which could be due to diabetes or a side effect of chemotherapy. The patient also has a sinus infection for which she takes prednisone as needed. The patient has recently switched from [facility] to [facility] for diabetes management and has been experiencing some tummy aches with the new medication. The patient also reports some hearing issues, described as a constant sound, which she manages by distracting herself with activities like games on her tablet.         ALLERGIES:  is allergic to atorvastatin, hctz [hydrochlorothiazide], norvasc [amlodipine besylate], other, and rosuvastatin.  MEDICATIONS:  Current Outpatient Medications  Medication Sig Dispense Refill   linagliptin (TRADJENTA) 5 MG TABS tablet Take 1 tablet (5 mg  total) by mouth daily.     Accu-Chek Softclix Lancets lancets Check blood sugar up to 3 times per day. 100 each 11   benzonatate (TESSALON) 100 MG capsule Take 1 capsule (100 mg total) by mouth every 8 (eight) hours. 21 capsule 0   Blood Glucose Monitoring Suppl (ACCU-CHEK GUIDE) w/Device KIT Check blood sugar up to 3 times a day 1 kit 1   carvedilol (COREG) 25 MG tablet Take 1 tablet (25 mg total) by mouth 2 (two) times daily with a meal. 180 tablet 3   cholecalciferol (VITAMIN D) 1000 UNITS tablet Take 1,000 Units by mouth daily.     diclofenac Sodium (VOLTAREN) 1 % GEL APPLY 4 GRAMS TOPICALLY 4  TIMES DAILY AS NEEDED 100 g 0   fluticasone (FLONASE) 50 MCG/ACT nasal spray USE 2 SPRAYS IN BOTH  NOSTRILS DAILY 16 g 3   furosemide (LASIX) 20 MG tablet Take 20 mg by mouth daily. Takes 3x/week     gabapentin (NEURONTIN) 100 MG capsule Take 1 capsule (100 mg total) by mouth at bedtime. 90 capsule 0   glucose blood (ACCU-CHEK GUIDE) test strip Check blood sugar 3 times per day 100 each 12   hydrALAZINE (APRESOLINE) 25 MG tablet Take 50 mg by mouth 2 (two) times daily.     hydrocortisone (PROCTOZONE-HC) 2.5 % rectal cream Place 1 Application rectally as needed for hemorrhoids or anal itching.     LORazepam (ATIVAN) 1 MG tablet TAKE 1 TABLET BY MOUTH DAILY AS NEEDED FOR ANXIETY /PANIC ATTACKS 10 tablet 0   Multiple Vitamin (MULTIVITAMIN) tablet Take 1 tablet by mouth daily.     olmesartan (  BENICAR) 40 MG tablet Take 1 tablet (40 mg total) by mouth every evening. 90 tablet 3   simvastatin (ZOCOR) 20 MG tablet Take 1 tablet (20 mg total) by mouth at bedtime. 90 tablet 3   traMADol (ULTRAM) 50 MG tablet Take 50 mg by mouth as needed.     No current facility-administered medications for this visit.    PHYSICAL EXAMINATION: ECOG PERFORMANCE STATUS: 1 - Symptomatic but completely ambulatory  Vitals:   04/12/23 1201 04/12/23 1232  BP: (!) 169/80 (!) 140/87  Pulse:    Resp:    Temp:    SpO2:      Filed Weights   04/12/23 1200  Weight: 234 lb 1.6 oz (106.2 kg)    Physical Exam          (exam performed in the presence of a chaperone)  LABORATORY DATA:  I have reviewed the data as listed    Latest Ref Rng & Units 09/07/2022    3:12 PM 09/03/2022    9:27 PM 07/25/2022    2:00 AM  CMP  Glucose 70 - 99 mg/dL 84  409  811   BUN 8 - 23 mg/dL 16  21  18    Creatinine 0.44 - 1.00 mg/dL 9.14  7.82  9.56   Sodium 135 - 145 mmol/L 141  141  139   Potassium 3.5 - 5.1 mmol/L 3.8  3.8  3.6   Chloride 98 - 111 mmol/L 105  104  103   CO2 22 - 32 mmol/L 29  26  23    Calcium 8.9 - 10.3 mg/dL 9.4  9.5  8.9   Total Protein 6.5 - 8.1 g/dL   6.8   Total Bilirubin 0.3 - 1.2 mg/dL   0.5   Alkaline Phos 38 - 126 U/L   65   AST 15 - 41 U/L   21   ALT 0 - 44 U/L   18     Lab Results  Component Value Date   WBC 7.1 09/07/2022   HGB 11.2 (L) 09/07/2022   HCT 33.7 (L) 09/07/2022   MCV 83.8 09/07/2022   PLT 246 09/07/2022   NEUTROABS 2.5 08/22/2021    ASSESSMENT & PLAN:  Breast cancer of upper-outer quadrant of left female breast (HCC) Left breast invasive ductal carcinoma stage II a ER/PR positive HER-2 positive status post bilateral mastectomies (Right CVA, Rt IJ DVT)  Followed by 6 cycles of TCH followed by Herceptin maintenance completed 04/17/2011  Letrozole June 2012-June 2017 BCI: High risk: 10% risk of late distant recurrence, no probability of benefit from extended adjuvant therapy.   Breast Cancer Surveillance: 1. Breast exam 04/12/2023: Benign 2.  No role of imaging studies since she had bilateral mastectomies 3.  Bone density 05/24/2019: normal T score 0.1  4.  Bone scan 05/12/2020: no evidence of metastatic disease to the bone. 5.  CT abdomen and pelvis 02/25/2023: Tiny left renal calculus   Return to clinic in 1 year for follow-up   ------------------------------------- Assessment and Plan    Diabetes Patient reports numbness in toes, possibly due to diabetic neuropathy.  No changes in medication reported. -Continue current management and monitor symptoms.  Tinnitus Patient reports persistent sound in ears, managed by distraction techniques. -Continue current coping strategies.  General Health Maintenance Patient is overall in good health, no new complaints. -Return for annual check-up in one year.          No orders of the defined types were placed in this  encounter.  The patient has a good understanding of the overall plan. she agrees with it. she will call with any problems that may develop before the next visit here. Total time spent: 30 mins including face to face time and time spent for planning, charting and co-ordination of care   Tamsen Meek, MD 04/12/23

## 2023-04-26 ENCOUNTER — Telehealth: Payer: Self-pay

## 2023-04-26 NOTE — Telephone Encounter (Signed)
**Note De-Identified Kelechi Astarita Obfuscation** Per the [facility] Provider Portal: Prior Authorization/Notification is not required for the requested service(s)-CPT Code: 16109 Decision ID #: [id number]  I have forwarded the pts CPAP Titration order to the sleep lab so they can call her to schedule test.

## 2023-05-25 ENCOUNTER — Telehealth (INDEPENDENT_AMBULATORY_CARE_PROVIDER_SITE_OTHER): Payer: Self-pay | Admitting: Otolaryngology

## 2023-05-25 NOTE — Telephone Encounter (Signed)
 Confirmed appt & location [id number] afm

## 2023-05-26 ENCOUNTER — Encounter (INDEPENDENT_AMBULATORY_CARE_PROVIDER_SITE_OTHER): Payer: Self-pay

## 2023-05-26 ENCOUNTER — Ambulatory Visit (INDEPENDENT_AMBULATORY_CARE_PROVIDER_SITE_OTHER): Payer: Medicare Other | Admitting: Otolaryngology

## 2023-05-26 VITALS — HR 76 | Ht 68.0 in | Wt 230.0 lb

## 2023-05-26 DIAGNOSIS — H9312 Tinnitus, left ear: Secondary | ICD-10-CM | POA: Diagnosis not present

## 2023-05-26 DIAGNOSIS — H902 Conductive hearing loss, unspecified: Secondary | ICD-10-CM | POA: Diagnosis not present

## 2023-05-26 DIAGNOSIS — H8002 Otosclerosis involving oval window, nonobliterative, left ear: Secondary | ICD-10-CM

## 2023-05-26 DIAGNOSIS — H9012 Conductive hearing loss, unilateral, left ear, with unrestricted hearing on the contralateral side: Secondary | ICD-10-CM

## 2023-05-28 ENCOUNTER — Encounter: Payer: Self-pay | Admitting: Gastroenterology

## 2023-05-30 DIAGNOSIS — H8002 Otosclerosis involving oval window, nonobliterative, left ear: Secondary | ICD-10-CM | POA: Insufficient documentation

## 2023-05-30 NOTE — Progress Notes (Signed)
 Follow-up: Left ear tinnitus, left ear hearing loss  HPI: The patient is a 73 year old female who presents today complaining of persistent left ear tinnitus.  She describes her tinnitus as a constant chirping noise.  It is nonpulsatile.  The patient was last seen in February 2023.  At that time, she was noted to have left ear conductive hearing loss.  The hearing loss was likely a result of otosclerosis.  The patient returns today complaining of hearing difficulty, especially in noisy environments.  She denies any recent change in her hearing.  Currently she denies any otalgia, otorrhea, or vertigo.  Exam: General: Communicates without difficulty, well nourished, no acute distress. Head: Normocephalic, no evidence injury, no tenderness, facial buttresses intact without stepoff. Face/sinus: No tenderness to palpation and percussion. Facial movement is normal and symmetric. Eyes: PERRL, EOMI. No scleral icterus, conjunctivae clear. Neuro: CN II exam reveals vision grossly intact.  No nystagmus at any point of gaze. Ears: Auricles well formed without lesions.  Ear canals are intact without mass or lesion.  No erythema or edema is appreciated.  The TMs are intact without fluid. Nose: External evaluation reveals normal support and skin without lesions.  Dorsum is intact.  Anterior rhinoscopy reveals congested mucosa over anterior aspect of inferior turbinates and intact septum.  No purulence noted. Oral:  Oral cavity and oropharynx are intact, symmetric, without erythema or edema.  Mucosa is moist without lesions. Neck: Full range of motion without pain.  There is no significant lymphadenopathy.  No masses palpable.  Thyroid bed within normal limits to palpation.  Parotid glands and submandibular glands equal bilaterally without mass.  Trachea is midline. Neuro:  CN 2-12 grossly intact.   Assessment: 1.  Left ear subjective tinnitus. 2.  Subjectively stable asymmetric conductive hearing loss.  This is likely a  result of left ear otosclerosis.  Her ear canals, tympanic membranes, and middle ear spaces are all normal.  No middle ear effusion is noted.  Plan: 1.  The physical exam findings are reviewed with the patient. 2.  The pathophysiology of otosclerosis and subjective tinnitus are extensively discussed.  Questions are invited and answered. 3.  The strategies to cope with tinnitus, including the use of masker, hearing aids, tinnitus retraining therapy, and avoidance of caffeine and alcohol are discussed. 4.  The patient is a candidate for hearing amplification.  The hearing aid options are discussed. 5.  The patient will return for reevaluation in 1 year, sooner if needed.

## 2023-06-10 ENCOUNTER — Ambulatory Visit: Admitting: Internal Medicine

## 2023-06-10 ENCOUNTER — Encounter: Payer: Self-pay | Admitting: Internal Medicine

## 2023-06-10 VITALS — BP 130/72 | HR 92 | Temp 97.7°F | Ht 68.0 in | Wt 235.6 lb

## 2023-06-10 DIAGNOSIS — R0602 Shortness of breath: Secondary | ICD-10-CM

## 2023-06-10 DIAGNOSIS — I152 Hypertension secondary to endocrine disorders: Secondary | ICD-10-CM | POA: Diagnosis not present

## 2023-06-10 DIAGNOSIS — E1159 Type 2 diabetes mellitus with other circulatory complications: Secondary | ICD-10-CM

## 2023-06-10 DIAGNOSIS — R0609 Other forms of dyspnea: Secondary | ICD-10-CM | POA: Diagnosis not present

## 2023-06-10 MED ORDER — METHYLPREDNISOLONE ACETATE 80 MG/ML IJ SUSP
120.0000 mg | Freq: Once | INTRAMUSCULAR | Status: AC
Start: 2023-06-10 — End: 2023-06-10
  Administered 2023-06-10: 120 mg via INTRAMUSCULAR

## 2023-06-10 MED ORDER — BISOPROLOL FUMARATE 5 MG PO TABS: ORAL_TABLET | ORAL | 11 refills | Status: DC

## 2023-06-10 NOTE — Progress Notes (Unsigned)
 [name], female    DOB: 1950/06/05   MRN: [id number]   Brief patient profile:  2 yobf   never smoker/   [name] pt with cough variant asthma / nl baseline spiromery self- referred to pulmonary clinic 06/10/2023  for sob x 2 weeks   in setting of flare of rhinitis / severe nasal congestion.     History of Present Illness  06/10/2023  Pulmonary/ Acute  eval/[name]  Chief Complaint  Patient presents with   Acute Visit  Dyspnea:  still able grocery shop but feels sob   at rest at times, resolves spontaneously p 15 -20 min  Cough: none / nasal congestion/ stuffy nose/ sneeze  Sleep: flat bed/ 2 pillows  SABA use: saba helped a little but then stopped working sev weeks prior to OV   02 NWG:NFAO        No obvious day to day or daytime pattern/variability or assoc excess/ purulent sputum or mucus plugs or hemoptysis or cp or chest tightness, subjective wheeze or overt sinus or hb symptoms.    Also denies any obvious fluctuation of symptoms with weather or environmental changes or other aggravating or alleviating factors except as outlined above   No unusual exposure hx or h/o childhood pna/ asthma or knowledge of premature birth.  Current Allergies, Complete Past Medical History, Past Surgical History, Family History, and Social History were reviewed in [employer] record.  ROS  The following are not active complaints unless bolded Hoarseness, sore throat, dysphagia, dental problems, itching, sneezing,  nasal congestion or discharge of excess mucus or purulent secretions, ear ache,   fever, chills, sweats, unintended wt loss or wt gain, classically pleuritic or exertional cp,  orthopnea pnd or arm/hand swelling  or leg swelling, presyncope, palpitations, abdominal pain, anorexia, nausea, vomiting, diarrhea  or change in bowel habits or change in bladder habits, change in stools or change in urine, dysuria, hematuria,  rash, arthralgias, visual complaints, headache,  numbness, weakness or ataxia or problems with walking or coordination,  change in mood or  memory.             Outpatient Medications Prior to Visit  Medication Sig Dispense Refill   Accu-Chek Softclix Lancets lancets Check blood sugar up to 3 times per day. 100 each 11   Blood Glucose Monitoring Suppl (ACCU-CHEK GUIDE) w/Device KIT Check blood sugar up to 3 times a day 1 kit 1   carvedilol (COREG) 25 MG tablet Take 1 tablet (25 mg total) by mouth 2 (two) times daily with a meal. 180 tablet 3   cholecalciferol (VITAMIN D) 1000 UNITS tablet Take 1,000 Units by mouth daily.     diclofenac Sodium (VOLTAREN) 1 % GEL APPLY 4 GRAMS TOPICALLY 4  TIMES DAILY AS NEEDED 100 g 0   fluticasone (FLONASE) 50 MCG/ACT nasal spray USE 2 SPRAYS IN BOTH  NOSTRILS DAILY 16 g 3   furosemide (LASIX) 20 MG tablet Take 20 mg by mouth daily. Takes 3x/week     gabapentin (NEURONTIN) 100 MG capsule Take 1 capsule (100 mg total) by mouth at bedtime. 90 capsule 0   glucose blood (ACCU-CHEK GUIDE) test strip Check blood sugar 3 times per day 100 each 12   hydrALAZINE (APRESOLINE) 25 MG tablet Take 50 mg by mouth 2 (two) times daily.     hydrocortisone (PROCTOZONE-HC) 2.5 % rectal cream Place 1 Application rectally as needed for hemorrhoids or anal itching.     linagliptin (TRADJENTA) 5 MG  TABS tablet Take 1 tablet (5 mg total) by mouth daily.     LORazepam (ATIVAN) 1 MG tablet TAKE 1 TABLET BY MOUTH DAILY AS NEEDED FOR ANXIETY /PANIC ATTACKS 10 tablet 0   Multiple Vitamin (MULTIVITAMIN) tablet Take 1 tablet by mouth daily.     olmesartan (BENICAR) 40 MG tablet Take 1 tablet (40 mg total) by mouth every evening. 90 tablet 3   simvastatin (ZOCOR) 20 MG tablet Take 1 tablet (20 mg total) by mouth at bedtime. 90 tablet 3   traMADol (ULTRAM) 50 MG tablet Take 50 mg by mouth as needed.     benzonatate (TESSALON) 100 MG capsule Take 1 capsule (100 mg total) by mouth every 8 (eight) hours. (Patient not taking: Reported on  06/10/2023) 21 capsule 0   No facility-administered medications prior to visit.    Past Medical History:  Diagnosis Date   Anemia    Anxiety    Arthritis    DJD, lumbar spondylosis   Asthma    Back pain    Bilateral renal cysts    Breast cancer (HCC)    left breast invasive ductal ca in remission as of 10/07/11 s/p double mastectomy   Breast cancer of upper-outer quadrant of left female breast (HCC) 01/15/2010   Per Dr. Milta Leach last clinic note 04/2012:  stage II a invasive ductal carcinoma of the left breast diagnosed in October 2011. She was found to have a ER/PR positive HER-2/neu positive breast cancer.She underwent bilateral mastectomies. Her right mastectomy was an elective prophylactic mastectomy due to her on comfort. Patient subsequently underwent adjuvant chemotherapy consisting of Taxotere carbop   Bronchitis 03/10/2012   Cataract    Chemotherapy follow-up examination 03/06/2011   Colon polyps    Cortical age-related cataract, bilateral 04/13/2016   Cough 03/06/2011   Depression    seasonal melancholia   E. coli UTI    Edema extremities    GERD (gastroesophageal reflux disease)    History of migraine headaches    Hyperlipidemia    Hypertension    Ileitis    Kidney stones    non obstructive    Myopathy    not statin related, cause unknown    Posterior vitreous detachment of right eye 04/13/2016      Objective:     BP 130/72 (BP Location: Right Arm, Patient Position: Sitting, Cuff Size: Large)   Pulse 92   Temp 97.7 F (36.5 C) (Temporal)   Ht 5\' 8"  (1.727 m)   Wt 235 lb 9.6 oz (106.9 kg)   LMP 12/31/1968   SpO2 97%   BMI 35.82 kg/m   SpO2: 97 % RA      Mod obese (by BMI) amb bf nad    HEENT : Oropharynx  clear     Nasal turbinates mod /severe edema/ no purulent secretions    NECK :  without  apparent JVD/ palpable Nodes/TM    LUNGS: no acc muscle use,  Nl contour chest which is clear to A and P bilaterally without cough on insp or exp  maneuvers   CV:  RRR  no s3 or murmur or increase in P2, and no edema   ABD: tensely obese  soft and nontender   MS:  Gait nl   ext warm without deformities Or obvious joint restrictions  calf tenderness, cyanosis or clubbing    SKIN: warm and dry without lesions    NEURO:  alert, approp, nl sensorium with  no motor or cerebellar deficits apparent.  Assessment   No problem-specific Assessment & Plan notes found for this encounter.     Sandrea Hughs, MD 06/10/2023

## 2023-06-10 NOTE — Patient Instructions (Addendum)
 Zyrtec  as needed  for nasal congestion   Depomedrol 120 mg IM in office today   Stop corevidol and start bisoprolol 5 mg twice daily and call me if any problems with your blood pressure pending your follow up with cardiology  Try prilosec otc 20mg   Take 30-60 min before first meal of the day and Pepcid ac (famotidine) 20 mg one @  bedtime until breathing better, congestion improved then try back off   GERD (REFLUX)  is an extremely common cause of respiratory symptoms just like yours , many times with no obvious heartburn at all.    It can be treated with medication, but also with lifestyle changes including elevation of the head of your bed (ideally with 6 -8inch blocks under the headboard of your bed),  Smoking cessation, avoidance of late meals, excessive alcohol, and avoid fatty foods, chocolate, peppermint, colas, red wine, and acidic juices such as orange juice.  NO MINT OR MENTHOL PRODUCTS SO NO COUGH DROPS  USE SUGARLESS CANDY INSTEAD (Jolley ranchers or Stover's or Life Savers) or even ice chips will also do - the key is to swallow to prevent all throat clearing. NO OIL BASED VITAMINS - use powdered substitutes.  Avoid fish oil when coughing.   If nasal congestion persists, please see your ENT provider   If breathing gets a lot worse despite these measures go to ER

## 2023-06-11 NOTE — Assessment & Plan Note (Addendum)
 Never smoker - PFTs 11/2007 nl baseline/ met criteria for asthma on MCT only by the FEF 25-75  - Echo 09/21/22  diastolic dysfunction grade 1, otherwise nl  - 06/10/2023 reported saba stopped working while taking coreg 25 mg bid > changed to bisoprolol 5 mg bid  - 06/10/2023  @ 235 lb  Walked on RA  x  1  lap(s) =  approx 250  ft  @ nl pace, stopped due to sob  with lowest 02 sats 95%  She has had multiple workups in past for same problem and I strongly doubt asthma here but she does report prior response to saba so rec trial of more selective BB  (see hbp)     Most likely this is combination of diastolic dysfunction / obesity/ deconditioning and not primarily a lung problem at  all.

## 2023-06-11 NOTE — Assessment & Plan Note (Signed)
 In the setting of respiratory symptoms of unknown etiology,  It would be preferable to use bystolic, the most beta -1  selective Beta blocker available in sample form, with bisoprolol the most selective generic choice  on the market, at least on a trial basis, to make sure the spillover Beta 2 effects of the less specific Beta blockers are not contributing to this patient's symptoms.   Rec  Bisoprolol 5 mg bid in place of coreg pending f/u with Cards next month and will call me in meantime if need to titrate this up   Each maintenance medication was reviewed in detail including emphasizing most importantly the difference between maintenance and prns and under what circumstances the prns are to be triggered using an action plan format where appropriate.  Total time for H and P, chart review, counseling, reviewing hfa device(s) , directly observing portions of ambulatory 02 saturation study/ and generating customized AVS unique to this office visit / same day charting = 40 min with pt new to me

## 2023-06-17 ENCOUNTER — Other Ambulatory Visit: Payer: Self-pay | Admitting: Internal Medicine

## 2023-06-17 NOTE — Telephone Encounter (Signed)
 Copied from CRM [id number]. Topic: Clinical - Medication Refill >> Jun 17, 2023  3:29 PM Orinda Kenner C wrote: Most Recent Pulmonary Care Visit:  Provider: [facility] Nyoka Cowden, MD 06/10/2023 Date: 05/22/2021  Medication: Bisoprolol Fumarate 5 MGtablet One twice daily   Has the patient contacted their pharmacy? Yes (Agent: If no, request that the patient contact the pharmacy for the refill. If patient does not wish to contact the pharmacy document the reason why and proceed with request.) (Agent: If yes, when and what did the pharmacy advise?)  Is this the correct pharmacy for this prescription? Yes If no, delete pharmacy and type the correct one.  This is the patient's preferred pharmacy:   [facility] - [place], [place] - [address] [place] [address] [place] [address] Phone: [phone] Fax: [phone]    Has the prescription been filled recently? No  Is the patient out of the medication? No  Has the patient been seen for an appointment in the last year OR does the patient have an upcoming appointment? Yes  Can we respond through MyChart? No, please call [phone]  Agent: Please be advised that Rx refills may take up to 3 business days. We ask that you follow-up with your pharmacy.

## 2023-07-02 ENCOUNTER — Emergency Department (HOSPITAL_BASED_OUTPATIENT_CLINIC_OR_DEPARTMENT_OTHER): Admitting: Radiology

## 2023-07-02 ENCOUNTER — Emergency Department (HOSPITAL_BASED_OUTPATIENT_CLINIC_OR_DEPARTMENT_OTHER)
Admission: EM | Admit: 2023-07-02 | Discharge: 2023-07-02 | Disposition: A | Discharge status: Home / Self Care | Attending: Emergency Medicine | Admitting: Emergency Medicine

## 2023-07-02 ENCOUNTER — Ambulatory Visit (HOSPITAL_BASED_OUTPATIENT_CLINIC_OR_DEPARTMENT_OTHER): Payer: Self-pay | Admitting: Pulmonary Disease

## 2023-07-02 ENCOUNTER — Other Ambulatory Visit: Payer: Self-pay

## 2023-07-02 ENCOUNTER — Encounter (HOSPITAL_BASED_OUTPATIENT_CLINIC_OR_DEPARTMENT_OTHER): Payer: Self-pay | Admitting: Emergency Medicine

## 2023-07-02 DIAGNOSIS — Z7984 Long term (current) use of oral hypoglycemic drugs: Secondary | ICD-10-CM | POA: Diagnosis not present

## 2023-07-02 DIAGNOSIS — Z853 Personal history of malignant neoplasm of breast: Secondary | ICD-10-CM | POA: Diagnosis not present

## 2023-07-02 DIAGNOSIS — I5030 Unspecified diastolic (congestive) heart failure: Secondary | ICD-10-CM | POA: Diagnosis not present

## 2023-07-02 DIAGNOSIS — R600 Localized edema: Secondary | ICD-10-CM | POA: Diagnosis not present

## 2023-07-02 DIAGNOSIS — R0602 Shortness of breath: Secondary | ICD-10-CM | POA: Insufficient documentation

## 2023-07-02 DIAGNOSIS — E119 Type 2 diabetes mellitus without complications: Secondary | ICD-10-CM | POA: Diagnosis not present

## 2023-07-02 DIAGNOSIS — N1832 Chronic kidney disease, stage 3b: Secondary | ICD-10-CM | POA: Insufficient documentation

## 2023-07-02 DIAGNOSIS — I130 Hypertensive heart and chronic kidney disease with heart failure and stage 1 through stage 4 chronic kidney disease, or unspecified chronic kidney disease: Secondary | ICD-10-CM | POA: Diagnosis not present

## 2023-07-02 DIAGNOSIS — M7989 Other specified soft tissue disorders: Secondary | ICD-10-CM | POA: Insufficient documentation

## 2023-07-02 DIAGNOSIS — Z79899 Other long term (current) drug therapy: Secondary | ICD-10-CM | POA: Insufficient documentation

## 2023-07-02 DIAGNOSIS — J45909 Unspecified asthma, uncomplicated: Secondary | ICD-10-CM | POA: Insufficient documentation

## 2023-07-02 LAB — BASIC METABOLIC PANEL WITH GFR
Anion gap: 8 (ref 5–15)
BUN: 30 mg/dL — ABNORMAL HIGH (ref 8–23)
CO2: 30 mmol/L (ref 22–32)
Calcium: 9.9 mg/dL (ref 8.9–10.3)
Chloride: 106 mmol/L (ref 98–111)
Creatinine, Ser: 1.78 mg/dL — ABNORMAL HIGH (ref 0.44–1.00)
GFR, Estimated: 30 mL/min — ABNORMAL LOW (ref >60)
Glucose, Bld: 149 mg/dL — ABNORMAL HIGH (ref 70–99)
Potassium: 4.2 mmol/L (ref 3.5–5.1)
Sodium: 144 mmol/L (ref 135–145)

## 2023-07-02 LAB — CBC
HCT: 36 % (ref 36.0–46.0)
Hemoglobin: 12 g/dL (ref 12.0–15.0)
MCH: 28.5 pg (ref 26.0–34.0)
MCHC: 33.3 g/dL (ref 30.0–36.0)
MCV: 85.5 fL (ref 80.0–100.0)
Platelets: 218 10*3/uL (ref 150–400)
RBC: 4.21 MIL/uL (ref 3.87–5.11)
RDW: 13.3 % (ref 11.5–15.5)
WBC: 8.4 10*3/uL (ref 4.0–10.5)
nRBC: 0 % (ref 0.0–0.2)

## 2023-07-02 LAB — BRAIN NATRIURETIC PEPTIDE: B Natriuretic Peptide: 31.2 pg/mL (ref 0.0–100.0)

## 2023-07-02 LAB — TROPONIN I (HIGH SENSITIVITY)
Troponin I (High Sensitivity): 5 ng/L (ref <18)
Troponin I (High Sensitivity): 5 ng/L (ref <18)

## 2023-07-02 NOTE — ED Provider Notes (Signed)
 [name] Provider Note   CSN: [id number] Arrival date & time: 07/02/23  1400     History  Chief Complaint  Patient presents with   Shortness of Breath    [name] is a 73 y.o. female.  Patient with history of hypertension, diabetes, asthma, breast CA in remission (2011), CKD IIIb and diastolic HF --presents to the emergency department today for evaluation of ongoing shortness of breath, worse with exertion.  Patient has had symptoms for about a month.  She saw her pulmonologist on 06/10/2023 for the same.  She was having allergic rhinitis symptoms at that time.  She was encouraged to use albuterol.  She states that she has not noticed much improvement.  She has been able to clear her airways better by blowing her nose through 1 nostril at a time.  No fevers or cough.  She has baseline lower extremity swelling.  She states that she was recently taken off of carvedilol to see if this helps with her shortness of breath.  She is able to lie flat without getting short of breath or waking up gasping for air.  No leg pain.  No chest pains.  Patient called pulmonology office today to ask for a follow-up appointment and she was referred to the emergency department.  She has been having the symptoms daily, not necessarily any worse today.       Home Medications Prior to Admission medications   Medication Sig Start Date End Date Taking? Authorizing Provider  Accu-Chek Softclix Lancets lancets Check blood sugar up to 3 times per day. 01/06/21   Amponsah, Prosper M, MD  bisoprolol (ZEBETA) 5 MG tablet One twice daily 06/10/23   Wert, Michael B, MD  Blood Glucose Monitoring Suppl (ACCU-CHEK GUIDE) w/Device KIT Check blood sugar up to 3 times a day 01/06/21   Vita Grip, MD  cholecalciferol (VITAMIN D) 1000 UNITS tablet Take 1,000 Units by mouth daily.    [provider]  diclofenac Sodium (VOLTAREN) 1 % GEL APPLY 4 GRAMS  TOPICALLY 4  TIMES DAILY AS NEEDED 02/17/21   Freada Jacobs, MD  fluticasone [facility]) 50 MCG/ACT nasal spray USE 2 SPRAYS IN BOTH  NOSTRILS DAILY 05/22/21   Masters, Alston Jerry, DO  furosemide (LASIX) 20 MG tablet Take 20 mg by mouth daily. Takes 3x/week    [provider]  gabapentin (NEURONTIN) 100 MG capsule Take 1 capsule (100 mg total) by mouth at bedtime. 09/05/20   Charity Conch, DPM  glucose blood (ACCU-CHEK GUIDE) test strip Check blood sugar 3 times per day 01/06/21   Vita Grip, MD  hydrALAZINE (APRESOLINE) 25 MG tablet Take 50 mg by mouth 2 (two) times daily.    [provider]  hydrocortisone (PROCTOZONE-HC) 2.5 % rectal cream Place 1 Application rectally as needed for hemorrhoids or anal itching.    [provider]  linagliptin (TRADJENTA) 5 MG TABS tablet Take 1 tablet (5 mg total) by mouth daily. 04/12/23   Gudena, Vinay, MD  LORazepam (ATIVAN) 1 MG tablet TAKE 1 TABLET BY MOUTH DAILY AS NEEDED FOR ANXIETY Gerri Kras ATTACKS 02/19/21   Katsadouros, Vasilios, MD  Multiple Vitamin (MULTIVITAMIN) tablet Take 1 tablet by mouth daily.    [provider]  olmesartan (BENICAR) 40 MG tablet Take 1 tablet (40 mg total) by mouth every evening. 05/22/21   Masters, Katie, DO  simvastatin (ZOCOR) 20 MG tablet Take 1 tablet (20 mg total) by mouth at bedtime. 05/22/21  Masters, Alston Jerry, DO  traMADol (ULTRAM) 50 MG tablet Take 50 mg by mouth as needed.    [provider]      Allergies    Atorvastatin, Hctz [hydrochlorothiazide], Norvasc [amlodipine besylate], Other, and Rosuvastatin    Review of Systems   Review of Systems  Physical Exam Updated Vital Signs BP (!) 177/83 (BP Location: Right Arm)   Pulse 73   Temp 98 F (36.7 C) (Oral)   Resp 18   LMP 12/31/1968   SpO2 99%  Physical Exam Vitals and nursing note reviewed.  Constitutional:      Appearance: She is well-developed. She is not diaphoretic.  HENT:     Head: Normocephalic and  atraumatic.     Mouth/Throat:     Mouth: Mucous membranes are not dry.  Eyes:     Conjunctiva/sclera: Conjunctivae normal.  Neck:     Vascular: Normal carotid pulses. No JVD.     Trachea: Trachea normal. No tracheal deviation.  Cardiovascular:     Rate and Rhythm: Normal rate and regular rhythm.     Pulses: No decreased pulses.          Radial pulses are 2+ on the right side and 2+ on the left side.     Heart sounds: Normal heart sounds, S1 normal and S2 normal. No murmur heard. Pulmonary:     Effort: Pulmonary effort is normal. No respiratory distress.     Breath sounds: No wheezing, rhonchi or rales.  Chest:     Chest wall: No tenderness.  Abdominal:     General: Bowel sounds are normal.     Palpations: Abdomen is soft.     Tenderness: There is no abdominal tenderness. There is no guarding or rebound.  Musculoskeletal:        General: Normal range of motion.     Cervical back: Normal range of motion and neck supple. No muscular tenderness.     Right lower leg: Edema present.     Left lower leg: Edema present.     Comments: Bilateral pedal edema  Skin:    General: Skin is warm and dry.     Coloration: Skin is not pale.  Neurological:     Mental Status: She is alert.     ED Results / Procedures / Treatments   Labs (all labs ordered are listed, but only abnormal results are displayed) Labs Reviewed  BASIC METABOLIC PANEL WITH GFR - Abnormal; Notable for the following components:      Result Value   Glucose, Bld 149 (*)    BUN 30 (*)    Creatinine, Ser 1.78 (*)    GFR, Estimated 30 (*)    All other components within normal limits  CBC  BRAIN NATRIURETIC PEPTIDE  TROPONIN I (HIGH SENSITIVITY)  TROPONIN I (HIGH SENSITIVITY)    ED ECG REPORT   Date: 07/02/2023  Rate: 70  Rhythm: normal sinus rhythm  QRS Axis: normal  Intervals: normal  ST/T Wave abnormalities: nonspecific T wave changes  Conduction Disutrbances:none  Narrative Interpretation:   Old EKG  Reviewed: unchanged  I have personally reviewed the EKG tracing and agree with the computerized printout as noted.   Radiology DG Chest 2 View Result Date: 07/02/2023 CLINICAL DATA:  sob EXAM: CHEST - 2 VIEW COMPARISON:  Multiple, most recent September 03, 2022 FINDINGS: No focal airspace consolidation, pleural effusion, or pneumothorax. No cardiomegaly. Aortic atherosclerosis. No acute fracture or destructive lesion. Multilevel degenerative disc disease of the spine. Cervical fusion  hardware. IMPRESSION: No acute cardiopulmonary abnormality. Electronically Signed   By: Rance Burrows M.D.   On: 07/02/2023 15:56    Procedures Procedures    Medications Ordered in ED Medications - No data to display  ED Course/ Medical Decision Making/ A&P    Patient seen and examined. History obtained directly from patient and daughter at bedside.  I reviewed the patient's most recent pulmonology and cardiology notes.  Labs/EKG: Ordered CBC with normal hemoglobin and white blood cell count; BMP with elevated creatinine at 1.78 and elevated BUN at 30, near baseline for the patient's known history of chronic kidney disease, glucose 149 with normal anion gap; troponin normal at 5.  Awaiting second troponin, added on BNP.  Imaging: Awaiting chest x-ray.  Medications/Fluids: None ordered  Most recent vital signs reviewed and are as follows: BP (!) 177/83 (BP Location: Right Arm)   Pulse 73   Temp 98 F (36.7 C) (Oral)   Resp 18   LMP 12/31/1968   SpO2 99%   Initial impression: Exertional dyspnea, subacute.  Will have patient ambulate and check pulse ox.  5:33 PM Reassessment performed. Patient appears stable, comfortable. She ambulated well.    RT Note:  Patient oxygen saturation on room air at rest= 97% HR 83, RR 18 Patient oxygen saturation on room air while ambulating= 95%, HR 92, RR 18   Labs personally reviewed and interpreted including: BNP normal no, second troponin stable.  Reviewed  pertinent lab work and imaging with patient at bedside. Questions answered.   Most current vital signs reviewed and are as follows: BP (!) 143/67   Pulse 61   Temp 98 F (36.7 C) (Oral)   Resp 17   LMP 12/31/1968   SpO2 100%   Plan: Discharge to home.   Prescriptions written for: None ordered  Other home care instructions discussed: Monitoring of symptoms.  Continue furosemide dosing as planned.  ED return instructions discussed: Worsening shortness of breath, swelling, difficulty breathing.  Follow-up instructions discussed: Patient encouraged to follow-up with their PCP next week.  Patient does have a scheduled appointment with cardiologist in about 2 and half weeks.                                Medical Decision Making Amount and/or Complexity of Data Reviewed Labs: ordered. Radiology: ordered.   Patient with history of heart failure, asthma presents with persistent but not worsening shortness of breath.  She has seen pulmonology and has not really gotten much improvement after seeing them.  Due to follow-up with her cardiologist.  Today her workup is reassuring.  Troponins are normal.  From a CHF standpoint, BNP is normal, chest x-ray is clear, patient without concerning features of worsening heart failure.  Low concern for ACS.  Symptoms are not really concerning for PE given no tachycardia, hypoxia, chest pain.  Patient ambulated well without desaturation.  Comfortable with discharge to home with continued medications, appropriate follow-up.        Final Clinical Impression(s) / ED Diagnoses Final diagnoses:  Shortness of breath    Rx / DC Orders ED Discharge Orders     None         Lyna Sandhoff, PA-C 07/02/23 1735    Mozell Arias, MD 07/03/23 1443

## 2023-07-02 NOTE — ED Triage Notes (Signed)
 Sob x "couple months" Worse today Sob comes and goes Denies chest pain, no n/v/d No improvement with inhaler

## 2023-07-02 NOTE — ED Notes (Signed)
 Pt discharged home and given discharge paperwork. Opportunities given for questions. Pt verbalizes understanding. Jillyn Hidden , RN

## 2023-07-02 NOTE — Discharge Instructions (Signed)
 Please read and follow all provided instructions.  Your diagnoses today include:  1. Shortness of breath    Tests performed today include: An EKG of your heart A chest x-ray Cardiac enzymes - a blood test for heart muscle damage Blood counts and electrolytes Vital signs. See below for your results today.   Medications prescribed:   Take any prescribed medications only as directed.  Follow-up instructions: Please follow-up with your primary care provider as soon as you can for further evaluation of your symptoms.   Return instructions:  SEEK IMMEDIATE MEDICAL ATTENTION IF: You have severe chest pain, especially if the pain is crushing or pressure-like and spreads to the arms, back, neck, or jaw, or if you have sweating, nausea or vomiting, or trouble with breathing. THIS IS AN EMERGENCY. Do not wait to see if the pain will go away. Get medical help at once. Call 911. DO NOT drive yourself to the hospital.  Your chest pain gets worse and does not go away after a few minutes of rest.  You have an attack of chest pain lasting longer than what you usually experience.  You have significant dizziness, if you pass out, or have trouble walking.  You have chest pain not typical of your usual pain for which you originally saw your caregiver.  You have any other emergent concerns regarding your health.  Additional Information: Chest pain comes from many different causes. Your caregiver has diagnosed you as having chest pain that is not specific for one problem, but does not require admission.  You are at low risk for an acute heart condition or other serious illness.   Your vital signs today were: BP (!) 143/67   Pulse 61   Temp 98 F (36.7 C) (Oral)   Resp 17   LMP 12/31/1968   SpO2 100%  If your blood pressure (BP) was elevated above 135/85 this visit, please have this repeated by your doctor within one month. --------------

## 2023-07-02 NOTE — Telephone Encounter (Signed)
 Copied from CRM [id number]. Topic: Clinical - Red Word Triage >> Jul 02, 2023 12:06 PM Theodis Sato wrote: Red Word that prompted transfer to Nurse Triage: New Shortness of breath, seeking appointment with Dr. Marguerite Olea SUMMARY NOTE: Pt reporting that she has been having SOB for the past couple months but worsening over time especially last few days, pt states it is mild SOB, speaking in full sentences, wheezing sometimes, using her albuterol inhaler once daily, last used inhaler last night. Pt confirms no chest pain, dizziness, weakness, heart racing, blurred vision, headache, or leg swelling beyond her usual. Pt took BP per nurse request, reading of 169/83 and HR 66 bpm. Advised pt take BP take again in 5 min, received reading 169/81 HR 70. Advised pt go to ED for worsening SOB and high BP, have another adult drive. Advised use albuterol inhaler as well. Advised that sending HP message over to pulm office for further recommendations. Pt and daughter verbalized understanding. Please advise.  E2C2 Pulmonary Triage - Initial Assessment Questions "Chief Complaint (e.g., cough, sob, wheezing, fever, chills, sweat or additional symptoms) *Go to specific symptom protocol after initial questions. Wheezing sometimes Any time, mostly all the time but it does come and go No dizziness, weakness, chest pain Runny nose, think allergies No fever, heart racing If cough anything up it's clear No trouble breathing lying down Speaking in full sentences Not been checking BP lately Mild SOB per pt No more swelling to legs than usual BP 169/83 HR 66 No blurred vision, headache  "How long have symptoms been present?" Couple of months ago, worsening for past month, worse today and yesterday  MEDICINES:   "Have you used any OTC meds to help with symptoms?" No  "Have you used your inhalers/maintenance medication?" Yes If yes, "What medications?" Albuterol inhaler, right now not helping not like it has in the  past  If inhaler, ask "How many puffs and how often?" Note: Review instructions on medication in the chart. Once daily  OXYGEN: "Do you wear supplemental oxygen?" No  "Do you monitor your oxygen levels?" No  Reason for Disposition  [1] Systolic BP  >= 160 OR Diastolic >= 100 AND [2] cardiac (e.g., breathing difficulty, chest pain) or neurologic symptoms (e.g., new-onset blurred or double vision, unsteady gait)  Answer Assessment - Initial Assessment Questions 1. BLOOD PRESSURE: "What is the blood pressure?" "Did you take at least two measurements 5 minutes apart?"     Last time took BP last month, took 169/83 and 5 min 169/81 3. HOW: "How did you take your blood pressure?" (e.g., automatic home BP monitor, visiting nurse)     At-home monitor  Protocols used: Blood Pressure - High-A-AH

## 2023-07-02 NOTE — ED Notes (Signed)
 RT Note:  Patient oxygen saturation on room air at rest= 97% HR 83, RR 18 Patient oxygen saturation on room air while ambulating= 95%, HR 92, RR 18  Patient tolerated well

## 2023-07-02 NOTE — ED Notes (Signed)
RT

## 2023-07-20 ENCOUNTER — Other Ambulatory Visit (HOSPITAL_COMMUNITY): Payer: Self-pay | Admitting: *Deleted

## 2023-07-20 ENCOUNTER — Ambulatory Visit (HOSPITAL_COMMUNITY)
Admission: RE | Admit: 2023-07-20 | Discharge: 2023-07-20 | Disposition: A | Discharge status: Home / Self Care | Source: Ambulatory Visit | Attending: Internal Medicine | Admitting: Internal Medicine

## 2023-07-20 ENCOUNTER — Encounter (HOSPITAL_COMMUNITY): Payer: Self-pay | Admitting: Internal Medicine

## 2023-07-20 VITALS — BP 120/78 | HR 78 | Wt 238.0 lb

## 2023-07-20 DIAGNOSIS — I5032 Chronic diastolic (congestive) heart failure: Secondary | ICD-10-CM

## 2023-07-20 DIAGNOSIS — I10 Essential (primary) hypertension: Secondary | ICD-10-CM | POA: Diagnosis not present

## 2023-07-20 DIAGNOSIS — G4733 Obstructive sleep apnea (adult) (pediatric): Secondary | ICD-10-CM | POA: Diagnosis not present

## 2023-07-20 DIAGNOSIS — R079 Chest pain, unspecified: Secondary | ICD-10-CM | POA: Diagnosis not present

## 2023-07-20 LAB — CBC
HCT: 33.9 % — ABNORMAL LOW (ref 36.0–46.0)
Hemoglobin: 11.3 g/dL — ABNORMAL LOW (ref 12.0–15.0)
MCH: 28.9 pg (ref 26.0–34.0)
MCHC: 33.3 g/dL (ref 30.0–36.0)
MCV: 86.7 fL (ref 80.0–100.0)
Platelets: 221 10*3/uL (ref 150–400)
RBC: 3.91 MIL/uL (ref 3.87–5.11)
RDW: 13.4 % (ref 11.5–15.5)
WBC: 7.2 10*3/uL (ref 4.0–10.5)
nRBC: 0 % (ref 0.0–0.2)

## 2023-07-20 LAB — BASIC METABOLIC PANEL WITH GFR
Anion gap: 9 (ref 5–15)
BUN: 22 mg/dL (ref 8–23)
CO2: 25 mmol/L (ref 22–32)
Calcium: 9.4 mg/dL (ref 8.9–10.3)
Chloride: 106 mmol/L (ref 98–111)
Creatinine, Ser: 1.54 mg/dL — ABNORMAL HIGH (ref 0.44–1.00)
GFR, Estimated: 35 mL/min — ABNORMAL LOW (ref >60)
Glucose, Bld: 66 mg/dL — ABNORMAL LOW (ref 70–99)
Potassium: 4 mmol/L (ref 3.5–5.1)
Sodium: 140 mmol/L (ref 135–145)

## 2023-07-20 NOTE — Progress Notes (Addendum)
 CARDIO-ONCOLOGY CLINIC CONSULT NOTE  Patient ID: [name], female   DOB: [birth year], 73 y.o.   MRN: [id number]  PCP: Hershell Lose, NP Nephrology: Dr. Yvonnie Heritage HF Cardiologist: Dr. Julane Ny   HPI: [name] is a 73 y.o.women with h/o obesity, HTN, diabetes, asthma, breast CA, CKD IIIb and diastolic HF referred back to AHF by Dr. Gudena for further evaluation of recurrent CP and SOB.   Found to have L ductal breast CA. ER/PR +. HER 2 neu was equivocal. s/p bilateral mastectomies in 01/2010. Lymph nodes negative.   She completed herceptin  therapy Jan. 2013.  PSG 06/2012 showed mild OSA -  she felt very claustrophobic, she could not afford the CPAP - hence abandoned  Echo 05/30/2012: EF 60-65% Grade 1 diastolic dysfunction  Myoview  11/14 LV Ejection Fraction: 68%. LV Wall Motion: NL LV Function; NL Wall Motion  Echo 05/30/20 EF 60-65% grade 2 DD  Echo 10/06/21 EF 60-65%, grade 1 DD.   Echo 7/24: EF 55-60% G1DD   Seen in ED 09/03/22 with  palpitation -> Zio 8/24 SR rare PVCs   Today she returns for HF follow up with her daughter. Remains cancer free. Remains SOB with any activity. Chest gets heavy and feels like a pressure.Under a lot of stress with her son possibly going through a divorce. In ER 07/02/23 for SOB. BNP 31   CXR was fine. Hstrop normal O2 sats with walking 97% -> 95%  Has gained 10 pounds over past 2 years. Lost weight but had to stop due to Gi symptoms.   Past Medical History:  Diagnosis Date   Anemia    Anxiety    Arthritis    DJD, lumbar spondylosis   Asthma    Back pain    Bilateral renal cysts    Breast cancer (HCC)    left breast invasive ductal ca in remission as of 10/07/11 s/p double mastectomy   Breast cancer of upper-outer quadrant of left female breast (HCC) 01/15/2010   Per Dr. Morrison Cid last clinic note 04/2012:  stage II a invasive ductal carcinoma of the left breast diagnosed in October 2011. She was found to have a ER/PR positive HER-2/neu positive breast  cancer.She underwent bilateral mastectomies. Her right mastectomy was an elective prophylactic mastectomy due to her on comfort. Patient subsequently underwent adjuvant chemotherapy consisting of Taxotere carbop   Bronchitis 03/10/2012   Cataract    Chemotherapy follow-up examination 03/06/2011   Colon polyps    Cortical age-related cataract, bilateral 04/13/2016   Cough 03/06/2011   Depression    seasonal melancholia   E. coli UTI    Edema extremities    GERD (gastroesophageal reflux disease)    History of migraine headaches    Hyperlipidemia    Hypertension    Ileitis    Kidney stones    non obstructive    Myopathy    not statin related, cause unknown    Posterior vitreous detachment of right eye 04/13/2016   Current Outpatient Medications on File Prior to Encounter  Medication Sig Dispense Refill   Accu-Chek Softclix Lancets lancets Check blood sugar up to 3 times per day. 100 each 11   Blood Glucose Monitoring Suppl (ACCU-CHEK GUIDE) w/Device KIT Check blood sugar up to 3 times a day 1 kit 1   cholecalciferol (VITAMIN D ) 1000 UNITS tablet Take 1,000 Units by mouth daily.     diclofenac  Sodium (VOLTAREN ) 1 % GEL APPLY 4 GRAMS TOPICALLY 4  TIMES DAILY AS NEEDED 100 g 0  ezetimibe (ZETIA) 10 MG tablet Take 10 mg by mouth daily.     fluticasone  (FLONASE ) 50 MCG/ACT nasal spray USE 2 SPRAYS IN BOTH  NOSTRILS DAILY 16 g 3   furosemide  (LASIX ) 20 MG tablet Take 20 mg by mouth 3 (three) times a week.     gabapentin  (NEURONTIN ) 100 MG capsule Take 1 capsule (100 mg total) by mouth at bedtime. 90 capsule 0   glucose blood (ACCU-CHEK GUIDE) test strip Check blood sugar 3 times per day 100 each 12   hydrALAZINE  (APRESOLINE ) 25 MG tablet Take 50 mg by mouth 2 (two) times daily.     hydrocortisone  (PROCTOZONE -HC) 2.5 % rectal cream Place 1 Application rectally as needed for hemorrhoids or anal itching.     linagliptin  (TRADJENTA ) 5 MG TABS tablet Take 1 tablet (5 mg total) by mouth daily.      LORazepam  (ATIVAN ) 1 MG tablet TAKE 1 TABLET BY MOUTH DAILY AS NEEDED FOR ANXIETY /PANIC ATTACKS 10 tablet 0   Multiple Vitamin (MULTIVITAMIN) tablet Take 1 tablet by mouth daily.     olmesartan  (BENICAR ) 40 MG tablet Take 1 tablet (40 mg total) by mouth every evening. 90 tablet 3   simvastatin  (ZOCOR ) 20 MG tablet Take 1 tablet (20 mg total) by mouth at bedtime. 90 tablet 3   traMADol  (ULTRAM ) 50 MG tablet Take 50 mg by mouth as needed.     bisoprolol  (ZEBETA ) 5 MG tablet One twice daily (Patient not taking: Reported on 07/20/2023) 60 tablet 11   No current facility-administered medications on file prior to encounter.   Social History   Socioeconomic History   Marital status: Single    Spouse name: Not on file   Number of children: Not on file   Years of education: Not on file   Highest education level: Not on file  Occupational History   Not on file  Tobacco Use   Smoking status: Never   Smokeless tobacco: Never  Vaping Use   Vaping status: Never Used  Substance and Sexual Activity   Alcohol use: No    Alcohol/week: 0.0 standard drinks of alcohol   Drug use: No   Sexual activity: Not on file    Comment: Hysterectomy  Other Topics Concern   Not on file  Social History Narrative   Single, 2 kids   Never smoked   No alcohol use   No drugs   Laid off from [employer] after working there for 14 years.    Social Drivers of Corporate investment banker Strain: Not on file  Food Insecurity: Not on file  Transportation Needs: Not on file  Physical Activity: Not on file  Stress: Not on file  Social Connections: Not on file   Family History  Problem Relation Age of Onset   Heart disease Mother 21       CAD   Breast cancer Mother    Cancer Mother 57       Breast cancer, mother   Cancer Father 70       colon cancer dx'ed age 91   Diabetes Father    Coronary artery disease Other    Multiple sclerosis Daughter    Diabetes Brother    Diabetes Brother    BP  120/78   Pulse 78   Wt 108 kg (238 lb)   LMP 12/31/1968   SpO2 100%   BMI 36.19 kg/m   Wt Readings from Last 3 Encounters:  07/20/23 108 kg (238 lb)  06/10/23 106.9 kg (235 lb 9.6 oz)  05/26/23 104.3 kg (230 lb)   Physical Exam General:  Obese No resp difficulty HEENT: normal Neck: supple. no JVD. Carotids 2+ bilat; no bruits. No lymphadenopathy or thryomegaly appreciated. Cor: PMI nondisplaced. Regular rate & rhythm. No rubs, gallops or murmurs. Lungs: clear Abdomen: obese soft, nontender, nondistended. No hepatosplenomegaly. No bruits or masses. Good bowel sounds. Extremities: no cyanosis, clubbing, rash, edema Neuro: alert & orientedx3, cranial nerves grossly intact. moves all 4 extremities w/o difficulty. Affect pleasant   ECG (personally reviewed): NSR LBBB 76 bpm, QRS 144 msec  Assessment/Plan  1. Dyspnea and chest pressure - Continues with persistent symptoms  - NYHA III - Will plan R/L cath to further evaluate  2. Chronic diastolic HF - Echo (3/22): EF 69-62% grade 2 DD  - Echo (7/23): EF 60-65% grade I DD - Echo 7/24: EF 55-60% G1DD  - Remains SOB with any activity NYHA IIIB  - Volume status looks ok on exam though. ReDS 24% - Did not tolerate Jardiance  (yeast infection) - Continue Coreg  25 mg bid. - Continue Lasix  20 mg MWF - Continue olmesartan  40 mg daily. - Continue hydralazine  50 mg bid. - Given persistent severe symptoms will pursue R/L cath  3. Breast cancer - Finished chemo.  - Remains in remission. - No change  4. HTN  -Blood pressure well controlled. Continue current regimen.  5.  DM II - Per PCP - On Mounjaro - Failed SGLT2i  7. OSA - Severe OSA with AH 32 on sleep study 6/24 - Encouraged CPAP use  8. Morbid obesity - Body mass index is 36.19 kg/m. - Suggested GLP1RA  I spent a total of 47 minutes today: 1) reviewing the patient's medical records including previous charts, labs and recent notes from other providers; 2) examining  the patient and counseling them on their medical issues/explaining the plan of care; 3) adjusting meds as needed and 4) ordering lab work or other needed tests.    Jules Oar MD 11:54 AM

## 2023-07-20 NOTE — Patient Instructions (Signed)
 No change in medications today. Return for right and left heart catheterization on Aug 10, 2023 - see below. Return to see Dr. Ascencion Black in 6 months. Please call us  at [phone] in October to schedule this appointment.  Please call us  at [phone] if any questions or concerns prior to your next appointment.      [facility] [address] [place] [address] Dept: [phone] Loc: [phone]  [name]  07/20/2023  You are scheduled for a Cardiac Catheterization on Tuesday, May 20 with [name].  1. Please arrive at the [facility] (Main Entrance A) at [facility]: [address] [place], [place] [address] at 7:00 AM (This time is 2 hour(s) before your procedure to ensure your preparation).   Free valet parking service is available. You will check in at ADMITTING. The support person will be asked to wait in the waiting room.  It is OK to have someone drop you off and come back when you are ready to be discharged.    Special note: Every effort is made to have your procedure done on time. Please understand that emergencies sometimes delay scheduled procedures.  2. Diet: Do not eat solid foods after midnight.  The patient may have clear liquids until 5am upon the day of the procedure.  3. Labs: drawn at today's visit.  4. Medication instructions in preparation for your procedure:   Contrast Allergy: No   Do not take Lasix  morning of procedure.  Do not take Tradjenta  morning of procedure.   On the morning of your procedure, take any morning medicines NOT listed above.  You may use sips of water.  5. Plan to go home the same day, you will only stay overnight if medically necessary. 6. Bring a current list of your medications and current insurance cards. 7. You MUST have a responsible person to drive you home. 8. Someone MUST be with you the first 24 hours after you  arrive home or your discharge will be delayed. 9. Please wear clothes that are easy to get on and off and wear slip-on shoes.  Thank you for allowing us  to care for you!   -- [facility]

## 2023-07-20 NOTE — Progress Notes (Signed)
 ReDS Vest / Clip - 07/20/23 1200       ReDS Vest / Clip   Station Marker D    Ruler Value 40    ReDS Value Range Low volume    ReDS Actual Value 24

## 2023-08-04 ENCOUNTER — Ambulatory Visit: Admitting: Gastroenterology

## 2023-08-04 ENCOUNTER — Encounter: Payer: Self-pay | Admitting: Gastroenterology

## 2023-08-04 ENCOUNTER — Other Ambulatory Visit (INDEPENDENT_AMBULATORY_CARE_PROVIDER_SITE_OTHER)

## 2023-08-04 VITALS — BP 134/66 | HR 75 | Ht 68.0 in | Wt 235.0 lb

## 2023-08-04 DIAGNOSIS — R140 Abdominal distension (gaseous): Secondary | ICD-10-CM | POA: Diagnosis not present

## 2023-08-04 DIAGNOSIS — D649 Anemia, unspecified: Secondary | ICD-10-CM

## 2023-08-04 DIAGNOSIS — R1084 Generalized abdominal pain: Secondary | ICD-10-CM | POA: Diagnosis not present

## 2023-08-04 LAB — IBC + FERRITIN
Ferritin: 255.2 ng/mL (ref 10.0–291.0)
Iron: 49 ug/dL (ref 42–145)
Saturation Ratios: 16.1 % — ABNORMAL LOW (ref 20.0–50.0)
TIBC: 303.8 ug/dL (ref 250.0–450.0)
Transferrin: 217 mg/dL (ref 212.0–360.0)

## 2023-08-04 MED ORDER — NA SULFATE-K SULFATE-MG SULF 17.5-3.13-1.6 GM/177ML PO SOLN
1.0000 | Freq: Once | ORAL | 0 refills | Status: AC
Start: 2023-08-04 — End: 2023-08-04

## 2023-08-04 NOTE — Progress Notes (Addendum)
 [facility] Consult Note:  History: [name] 08/04/2023  Referring provider: Hershell Lose, NP  Reason for consult/chief complaint: Abdominal Pain (Generalized intermittent abd pain for several years. Patient states the pain is more often and getting worse. No changes in bowel habits or rectal bleeding. )   Subjective  Prior history: Screening surveillance colonoscopies with Dr. Alvis Jourdain, most recently August 2021.  4 diminutive polyps on that exam, pathology report showed 1 tubular adenoma, others hyperplastic.  Colonoscopy and pathology reports on file in this EHR, also notes history of polyps on 2018 colonoscopy that were found to be adenomatous and serrated.  Father had colorectal cancer in his mid 45s.   Discussed the use of AI scribe software for clinical note transcription with the patient, who gave verbal consent to proceed.  History of Present Illness [name] is a 73 year old female who presents with chronic abdominal pain and bloating. She is accompanied by Sherrlyn Dolores, her family member. She was referred by Dr. Nguyen for evaluation of her abdominal pain.  She has experienced abdominal pain for over six to seven years, with a progressive increase in severity and frequency. The pain is constant, diffuse, and migratory across the abdomen, accompanied by bloating and a feeling of swelling and bloating.. Temporary relief is noted upon passing gas, but the pain quickly returns. No changes in bowel habits, with regular daily bowel movements and no blood in the stool. No nausea, vomiting, or difficulty swallowing. Appetite remains unchanged, and no significant weight loss has been reported.  While she has had some medication induced weight loss (while on GLP-1 agonist), she and her daughter note that she did not lose weight in the upper abdomen in the area that she feels is distended.  A CT scan of the abdomen performed several months ago was normal,  except for a small incidental kidney stone. She has undergone colonoscopies in 2018 and 2021, which revealed polyps but no significant findings related to her current symptoms.  Her medication history includes recent use of Tradjenta  for blood sugar management, which she has been on for a few months. She previously used Mounjaro, which was discontinued due to concerns about its potential contribution to her GI symptoms.  However, the abdominal pain did not change after discontinuing the medicine.  Family history is notable for colorectal cancer in her father. She has had polyps removed during previous colonoscopies.  Recently saw cardiology clinic with worsening dyspnea-plan for right and left heart catheterization scheduled on 08/10/2023 (see below)  ROS:  Review of Systems  Constitutional:  Negative for appetite change and unexpected weight change.  HENT:  Negative for mouth sores and voice change.   Eyes:  Negative for pain and redness.  Respiratory:  Negative for cough and shortness of breath.   Cardiovascular:  Negative for chest pain and palpitations.  Genitourinary:  Negative for dysuria and hematuria.  Musculoskeletal:  Negative for arthralgias and myalgias.  Skin:  Negative for pallor and rash.  Neurological:  Negative for weakness and headaches.  Hematological:  Negative for adenopathy.  She describes some chronic stress, mostly recently related to some family issues.   Past Medical History: Past Medical History:  Diagnosis Date   Anemia    Anxiety    Arthritis    DJD, lumbar spondylosis   Asthma    Back pain    Bilateral renal cysts    Breast cancer (HCC)    left breast invasive ductal ca in remission  as of 10/07/11 s/p double mastectomy   Breast cancer of upper-outer quadrant of left female breast (HCC) 01/15/2010   Per Dr. [place] Cid last clinic note 04/2012:  stage II a invasive ductal carcinoma of the left breast diagnosed in October 2011. She was found to have a ER/PR  positive HER-2/neu positive breast cancer.She underwent bilateral mastectomies. Her right mastectomy was an elective prophylactic mastectomy due to her on comfort. Patient subsequently underwent adjuvant chemotherapy consisting of Taxotere carbop   Bronchitis 03/10/2012   Cataract    Chemotherapy follow-up examination 03/06/2011   Colon polyps    Cortical age-related cataract, bilateral 04/13/2016   Cough 03/06/2011   Depression    seasonal melancholia   Diabetes mellitus type 2, controlled (HCC)    E. coli UTI    Edema extremities    GERD (gastroesophageal reflux disease)    History of migraine headaches    Hyperlipidemia    Hypertension    Ileitis    Kidney stones    non obstructive    Myopathy    not statin related, cause unknown    Posterior vitreous detachment of right eye 04/13/2016   From heart failure clinic visit 07/20/2023: " PSG 06/2012 showed mild OSA -  she felt very claustrophobic, she could not afford the CPAP - hence abandoned   Echo 05/30/2012: EF 60-65% Grade 1 diastolic dysfunction   Myoview  11/14 LV Ejection Fraction: 68%. LV Wall Motion: NL LV Function; NL Wall Motion   Echo 05/30/20 EF 60-65% grade 2 DD   Echo 10/06/21 EF 60-65%, grade 1 DD.    Echo 7/24: EF 55-60% G1DD    Seen in ED 09/03/22 with  palpitation -> Zio 8/24 SR rare PVCs    Today she returns for HF follow up with her daughter. Remains cancer free. Remains SOB with any activity. Chest gets heavy and feels like a pressure.Under a lot of stress with her son possibly going through a divorce. In ER 07/02/23 for SOB. BNP 31   CXR was fine. Hstrop normal O2 sats with walking 97% -> 95%  Has gained 10 pounds over past 2 years. Lost weight but had to stop due to Gi symptoms."   Past Surgical History: Past Surgical History:  Procedure Laterality Date   ABDOMINAL HYSTERECTOMY     for fibroids    APPENDECTOMY     BREAST SURGERY  2011   Bilateral mastectomy   CYSTOSCOPY WITH URETHRAL DILATATION N/A  12/26/2019   Procedure: CYSTOSCOPY WITH URETHRAL DILATATION;  Surgeon: Roxane Copp, MD;  Location: [facility];  Service: Urology;  Laterality: N/A;  30 MINS   INCONTINENCE SURGERY     LAMINECTOMY     C5-6 Dr. Richardo Chandler 2005    MASTECTOMY     bilateral    OOPHORECTOMY     TONSILLECTOMY       Family History: Family History  Problem Relation Age of Onset   Heart disease Mother 79       CAD   Breast cancer Mother    Cancer Mother 59       Breast cancer, mother   Cancer Father 67       colon cancer dx'ed age 70   Diabetes Father    Coronary artery disease Other    Multiple sclerosis Daughter    Diabetes Brother    Diabetes Brother     Social History: Social History   Socioeconomic History   Marital status: Single    Spouse name: Not on file  Number of children: Not on file   Years of education: Not on file   Highest education level: Not on file  Occupational History   Not on file  Tobacco Use   Smoking status: Never   Smokeless tobacco: Never  Vaping Use   Vaping status: Never Used  Substance and Sexual Activity   Alcohol use: No    Alcohol/week: 0.0 standard drinks of alcohol   Drug use: No   Sexual activity: Not on file    Comment: Hysterectomy  Other Topics Concern   Not on file  Social History Narrative   Single, 2 kids   Never smoked   No alcohol use   No drugs   Laid off from [employer] after working there for 14 years.    Social Drivers of Corporate investment banker Strain: Not on file  Food Insecurity: Not on file  Transportation Needs: Not on file  Physical Activity: Not on file  Stress: Not on file  Social Connections: Not on file    Allergies: Allergies  Allergen Reactions   Atorvastatin Nausea Only    REACTION: Nausea, abdominal pain, and reflux. Pt has used the medication in the past and experienced the same reaction before the medication was discontinued.   Hctz [email or web address] ]     Blurry vision   Norvasc   [Amlodipine  Besylate]     Blurry vision   Other Other (See Comments)   Rosuvastatin Itching    Outpatient Meds: Current Outpatient Medications  Medication Sig Dispense Refill   albuterol  (VENTOLIN  HFA) 108 (90 Base) MCG/ACT inhaler Inhale 2 puffs into the lungs every 6 (six) hours as needed for wheezing or shortness of breath.     bisoprolol  (ZEBETA ) 5 MG tablet One twice daily 60 tablet 11   cholecalciferol (VITAMIN D ) 1000 UNITS tablet Take 1,000 Units by mouth daily.     diclofenac  Sodium (VOLTAREN ) 1 % GEL APPLY 4 GRAMS TOPICALLY 4  TIMES DAILY AS NEEDED 100 g 0   ezetimibe (ZETIA) 10 MG tablet Take 10 mg by mouth daily.     fluticasone  (FLONASE ) 50 MCG/ACT nasal spray USE 2 SPRAYS IN BOTH  NOSTRILS DAILY (Patient taking differently: Place 2 sprays into both nostrils daily as needed for allergies.) 16 g 3   furosemide  (LASIX ) 20 MG tablet Take 20 mg by mouth every Monday, Wednesday, and Friday.     gabapentin  (NEURONTIN ) 100 MG capsule Take 1 capsule (100 mg total) by mouth at bedtime. (Patient taking differently: Take 100 mg by mouth daily as needed (Nerve pain).) 90 capsule 0   glucose blood (ACCU-CHEK GUIDE) test strip Check blood sugar 3 times per day 100 each 12   hydrALAZINE  (APRESOLINE ) 50 MG tablet Take 50-100 mg by mouth See admin instructions. Take 100 mg in the morning and 50 mg in the evening     linagliptin  (TRADJENTA ) 5 MG TABS tablet Take 1 tablet (5 mg total) by mouth daily.     LORazepam  (ATIVAN ) 1 MG tablet TAKE 1 TABLET BY MOUTH DAILY AS NEEDED FOR ANXIETY /PANIC ATTACKS 10 tablet 0   Multiple Vitamin (MULTIVITAMIN) tablet Take 1 tablet by mouth daily.     Na Sulfate-K Sulfate-Mg Sulfate concentrate (SUPREP) 17.5-3.13-1.6 GM/177ML SOLN Take 1 kit (354 mLs total) by mouth once for 1 dose. 354 mL 0   olmesartan  (BENICAR ) 40 MG tablet Take 1 tablet (40 mg total) by mouth every evening. 90 tablet 3   simvastatin  (ZOCOR ) 20 MG tablet Take 1 tablet (  20 mg total) by mouth at  bedtime. (Patient taking differently: Take 80 mg by mouth at bedtime.) 90 tablet 3   traMADol  (ULTRAM ) 50 MG tablet Take 50 mg by mouth daily as needed for moderate pain (pain score 4-6) or severe pain (pain score 7-10).     No current facility-administered medications for this visit.      ___________________________________________________________________ Objective   Exam:  BP 134/66   Pulse 75   Ht 5\' 8"  (1.727 m)   Wt 235 lb (106.6 kg)   LMP 12/31/1968   BMI 35.73 kg/m  Wt Readings from Last 3 Encounters:  08/04/23 235 lb (106.6 kg)  07/20/23 238 lb (108 kg)  06/10/23 235 lb 9.6 oz (106.9 kg)    General: Well-appearing Eyes: sclera anicteric, no redness ENT: oral mucosa moist without lesions, no cervical or supraclavicular lymphadenopathy CV: Regular without appreciable murmur, no JVD, no peripheral edema Resp: clear to auscultation bilaterally, normal RR and effort noted GI: soft, no tenderness, with active bowel sounds. No guarding or palpable organomegaly noted.  Truncal obesity No abdominal wall hernia noted Skin; warm and dry, no rash or jaundice noted Neuro: awake, alert and oriented x 3. Normal gross motor function and fluent speech   Labs:     Latest Ref Rng & Units 07/20/2023   12:23 PM 07/02/2023    2:24 PM 09/07/2022    3:12 PM  CBC  WBC 4.0 - 10.5 K/uL 7.2  8.4  7.1   Hemoglobin 12.0 - 15.0 g/dL 16.1  09.6  04.5   Hematocrit 36.0 - 46.0 % 33.9  36.0  33.7   Platelets 150 - 400 K/uL 221  218  246   Nml MCV     Latest Ref Rng & Units 07/20/2023   12:23 PM 07/02/2023    2:24 PM 09/07/2022    3:12 PM  CMP  Glucose 70 - 99 mg/dL 66  409  84   BUN 8 - 23 mg/dL 22  30  16    Creatinine 0.44 - 1.00 mg/dL 8.11  9.14  7.82   Sodium 135 - 145 mmol/L 140  144  141   Potassium 3.5 - 5.1 mmol/L 4.0  4.2  3.8   Chloride 98 - 111 mmol/L 106  106  105   CO2 22 - 32 mmol/L 25  30  29    Calcium  8.9 - 10.3 mg/dL 9.4  9.9  9.4      Last iron levels nml June  2024. Last B12 and folate yrs ago  Radiologic Studies:  CLINICAL DATA:  Left-sided abdominal pain for 3 weeks. Breast carcinoma. * Tracking Code: BO *   Creatinine was obtained on site at [facility] at [address]   Results: Creatinine 1.7 mg/dL.   EXAM: CT ABDOMEN AND PELVIS WITH CONTRAST   TECHNIQUE: Multidetector CT imaging of the abdomen and pelvis was performed using the standard protocol following bolus administration of intravenous contrast.   RADIATION DOSE REDUCTION: This exam was performed according to the departmental dose-optimization program which includes automated exposure control, adjustment of the mA and/or kV according to patient size and/or use of iterative reconstruction technique.   CONTRAST:  60mL ISOVUE -370 IOPAMIDOL  (ISOVUE -370) INJECTION 76%   COMPARISON:  10/23/2013   FINDINGS: Lower Chest: No acute findings.   Hepatobiliary: No suspicious hepatic masses identified. Gallbladder is unremarkable. No evidence of biliary ductal dilatation.   Pancreas:  No mass or inflammatory changes.   Spleen: Within normal limits in  size and appearance.   Adrenals/Urinary Tract: No suspicious masses identified. 2 mm calculus again seen in lower pole of left kidney. No evidence of ureteral calculi or hydronephrosis.   Stomach/Bowel: No evidence of obstruction, inflammatory process or abnormal fluid collections.   Vascular/Lymphatic: No pathologically enlarged lymph nodes. No acute vascular findings.   Reproductive: Prior hysterectomy noted. Adnexal regions are unremarkable in appearance.   Other:  None.   Musculoskeletal:  No suspicious bone lesions identified.   IMPRESSION: Tiny left renal calculus. No evidence of ureteral calculi, hydronephrosis, or other acute findings.     Electronically Signed   By: Marlyce Sine M.D.   On: 03/01/2023 11:46     Encounter Diagnoses  Name Primary?   Generalized abdominal pain Yes    Abdominal bloating    Anemia, unspecified type     Assessment and Plan Assessment & Plan Abdominal pain with bloating Chronic abdominal pain with bloating for over six years, slowly worsening in severity and frequency.  Difficult to characterize with no associated symptoms beyond the fairly constant diffuse dull discomfort and sensation of bloating. Broad differential diagnosis  -consider celiac disease and H. pylori infection among others. No evidence of colon cancer based on recent colonoscopy and CT scan. Discussed potential dietary triggers and stress as contributing factors. - Provide dietary information on common triggers for abdominal pain and gas. - Schedule tentative upper endoscopy and colonoscopy after heart catheterization results. - Forward today's note to primary care provider and Dr. Lorayne Rocks. They understand that the procedural timing and location may change based on catheterization results.  Colon polyps and family history of colon cancer Colon polyps with last colonoscopy in 2021 showing one precancerous polyp. Due for follow-up colonoscopy within the three to five-year interval due to family history and previous findings. - Schedule tentative colonoscopy after heart catheterization results. She was agreeable to the EGD and colonoscopy after discussion of procedure and risks.  The benefits and risks of the planned procedure(s) were described in detail with the patient or (when appropriate) their health care proxy.  Risks were outlined as including, but not limited to, bleeding, infection, perforation, adverse medication reaction leading to cardiac or pulmonary decompensation, pancreatitis (if ERCP).  The limitation of incomplete mucosal visualization was also discussed.  No guarantees or warranties were given.  Normocytic anemia on recent labs, does not appear to have been addressed as near as I can tell.  Unclear how or if it is related to her dyspnea.  Might not be related to  abdominal pain since it is recently developed. Sent her to lab today for iron studies, B12 and folate.  Will forward my note to primary care for further attention on that matter.  Some written dietary advice regarding typical causes of maldigestion bloating and gas were provided today.   Thank you for the courtesy of this consult.  Please call me with any questions or concerns.  Kerby Pearson III  CC: Referring provider noted above

## 2023-08-04 NOTE — Patient Instructions (Addendum)
 Your provider has requested that you go to the basement level for lab work before leaving today. Press "B" on the elevator. The lab is located at the first door on the left as you exit the elevator.  We have sent the following medications to your pharmacy for you to pick up at your convenience: Suprep  You have been scheduled for an endoscopy and colonoscopy. Please follow the written instructions given to you at your visit today.  If you use inhalers (even only as needed), please bring them with you on the day of your procedure.  DO NOT TAKE 7 DAYS PRIOR TO TEST- Trulicity (dulaglutide) Ozempic, Wegovy (semaglutide) Mounjaro (tirzepatide) Bydureon Bcise (exanatide extended release)  DO NOT TAKE 1 DAY PRIOR TO YOUR TEST Rybelsus (semaglutide) Adlyxin (lixisenatide) Victoza (liraglutide) Byetta (exanatide) ___________________________________________________________________________  _______________________________________________________  If your blood pressure at your visit was 140/90 or greater, please contact your primary care physician to follow up on this.  _______________________________________________________  If you are age 44 or older, your body mass index should be between 23-30. Your Body mass index is 35.73 kg/m. If this is out of the aforementioned range listed, please consider follow up with your Primary Care Provider.  If you are age 90 or younger, your body mass index should be between 19-25. Your Body mass index is 35.73 kg/m. If this is out of the aformentioned range listed, please consider follow up with your Primary Care Provider.   ________________________________________________________  The [facility] providers would like to encourage you to use MYCHART to communicate with providers for non-urgent requests or questions.  Due to long hold times on the telephone, sending your provider a message by [facility] may be a faster and more efficient way to get a response.   Please allow 48 business hours for a response.  Please remember that this is for non-urgent requests.  _______________________________________________________   _______________________________________________________  Food Guidelines for those with chronic digestive trouble:  Many people have difficulty digesting certain foods, causing a variety of distressing and embarrassing symptoms such as abdominal pain, bloating and gas.  These foods may need to be avoided or consumed in small amounts.  Here are some tips that might be helpful for you.  1.   Lactose intolerance is the difficulty or complete inability to digest lactose, the natural sugar in milk and anything made from milk.  This condition is harmless, common, and can begin any time during life.  Some people can digest a modest amount of lactose while others cannot tolerate any.  Also, not all dairy products contain equal amounts of lactose.  For example, hard cheeses such as parmesan have less lactose than soft cheeses such as cheddar.  Yogurt has less lactose than milk or cheese.  Many packaged foods (even many brands of bread) have milk, so read ingredient lists carefully.  It is difficult to test for lactose intolerance, so just try avoiding lactose as much as possible for a week and see what happens with your symptoms.  If you seem to be lactose intolerant, the best plan is to avoid it (but make sure you get calcium  from another source).  The next best thing is to use lactase enzyme supplements, available over the counter everywhere.  Just know that many lactose intolerant people need to take several tablets with each serving of dairy to avoid symptoms.  Lastly, a lot of restaurant food is made with milk or butter.  Many are things you might not suspect, such as mashed  potatoes, rice and pasta (cooked with butter) and "grilled" items.  If you are lactose intolerant, it never hurts to ask your server what has milk or butter.  2.   Fiber is an  important part of your diet, but not all fiber is well-tolerated.  Insoluble fiber such as bran is often consumed by normal gut bacteria and converted into gas.  Soluble fiber such as oats, squash, carrots and green beans are typically tolerated better.  3.   Some types of carbohydrates can be poorly digested.  Examples include: fructose (apples, cherries, pears, raisins and other dried fruits), fructans (onions, zucchini, large amounts of wheat), sorbitol/mannitol/xylitol and sucralose/Splenda (common artificial sweeteners), and raffinose (lentils, broccoli, cabbage, asparagus, brussel sprouts, many types of beans).  Do a Programmer, multimedia for [employer] and you will find helpful information. Beano, a dietary supplement, will often help with raffinose-containing foods.  As with lactase tablets, you may need several per serving.  4.   Whenever possible, avoid processed food&meats and chemical additives.  High fructose corn syrup, a common sweetener, may be difficult to digest.  Eggs and soy (comes from the soybean, and added to many foods now) are other common bloating/gassy foods.  5.  Regarding gluten:  gluten is a protein mainly found in wheat, but also rye and barley.  There is a condition called celiac sprue, which is an inflammatory reaction in the small intestine causing a variety of digestive symptoms.  Blood testing is highly reliable to look for this condition, and sometimes upper endoscopy with small bowel biopsies may be necessary to make the diagnosis.  Many patients who test negative for celiac sprue report improvement in their digestive symptoms when they switch to a gluten-free diet.  However, in these "non-celiac gluten sensitive" patients, the true role of gluten in their symptoms is unclear.  Reducing carbohydrates in general may decrease the gas and bloating caused when gut bacteria consume carbs. Also, some of these patients may actually be intolerant of the baker's yeast in bread products  rather than the gluten.  Flatbread and other reduced yeast breads might therefore be tolerated.  There is no specific testing available for most food intolerances, which are discovered mainly by dietary elimination.  Please do not embark on a gluten free diet unless directed by your doctor, as it is highly restrictive, and may lead to nutritional deficiencies if not carefully monitored.  Lastly, beware of internet claims offering "personalized" tests for food intolerances.  Such testing has no reliable scientific evidence to support its reliability and correlation to symptoms.    6.  The best advice is old advice, especially for those with chronic digestive trouble - try to eat "clean".  Balanced diet, avoid processed food, plenty of fruits and vegetables, cut down the sugar, minimal alcohol, avoid tobacco. Make time to care for yourself, get enough sleep, exercise when you can, reduce stress.  Your guts will thank you for it.   - Dr. Kale[facility]  ____________________________________________________________

## 2023-08-08 ENCOUNTER — Ambulatory Visit: Payer: Self-pay | Admitting: Gastroenterology

## 2023-08-09 ENCOUNTER — Telehealth (HOSPITAL_COMMUNITY): Payer: Self-pay

## 2023-08-09 DIAGNOSIS — I5032 Chronic diastolic (congestive) heart failure: Secondary | ICD-10-CM

## 2023-08-09 DIAGNOSIS — R0609 Other forms of dyspnea: Secondary | ICD-10-CM

## 2023-08-09 NOTE — Telephone Encounter (Signed)
 Insurance denied patients R&L heart Cath after speaking with Dr. Julane Ny, they request other imaging to be performed, per Dr. Julane Ny, have patient do a Cardiac PET Scan. Order entered and patient made aware.   Orders Placed This Encounter  Procedures   NM PET CT MYOCARDIAL SARCOIDOSIS    Standing Status:   Future    Expiration Date:   08/08/2024    If indicated for the ordered procedure, I authorize the administration of a radiopharmaceutical per Radiology protocol:   Yes    Preferred Imaging Location:   [facility]    Release to patient:   Immediate

## 2023-08-10 ENCOUNTER — Encounter (HOSPITAL_COMMUNITY): Admission: RE | Payer: Self-pay | Source: Home / Self Care

## 2023-08-10 ENCOUNTER — Ambulatory Visit (HOSPITAL_COMMUNITY): Admission: RE | Admit: 2023-08-10 | Source: Home / Self Care | Admitting: Internal Medicine

## 2023-08-10 SURGERY — RIGHT/LEFT HEART CATH AND CORONARY ANGIOGRAPHY
Anesthesia: LOCAL

## 2023-08-12 NOTE — Addendum Note (Signed)
 Addended by: Glorietta Lark on: 08/12/2023 09:13 AM   Modules accepted: Orders

## 2023-08-16 ENCOUNTER — Other Ambulatory Visit (HOSPITAL_COMMUNITY): Payer: Self-pay | Admitting: Internal Medicine

## 2023-08-24 ENCOUNTER — Encounter: Payer: Self-pay | Admitting: Gastroenterology

## 2023-08-25 ENCOUNTER — Other Ambulatory Visit: Payer: Self-pay | Admitting: Internal Medicine

## 2023-08-25 ENCOUNTER — Ambulatory Visit (HOSPITAL_BASED_OUTPATIENT_CLINIC_OR_DEPARTMENT_OTHER): Attending: Cardiology | Admitting: Cardiology

## 2023-08-25 ENCOUNTER — Ambulatory Visit: Payer: Self-pay

## 2023-08-25 VITALS — Ht 68.0 in | Wt 235.0 lb

## 2023-08-25 DIAGNOSIS — G4733 Obstructive sleep apnea (adult) (pediatric): Secondary | ICD-10-CM

## 2023-08-25 MED ORDER — CEFDINIR 300 MG PO CAPS: 300.0000 mg | ORAL_CAPSULE | Freq: Two times a day (BID) | ORAL | 0 refills | Status: DC

## 2023-08-25 MED ORDER — PREDNISONE 10 MG PO TABS: ORAL_TABLET | ORAL | 0 refills | Status: DC

## 2023-08-25 NOTE — Telephone Encounter (Signed)
 I called and spoke with the pt and notified of response per Dr Waymond Hailey  She verbalized understanding  She states that she is already est with ENT (Dr Darlin Ehrlich) and will f/u with him if symptoms persist Rxs sent to pharm and nothing further needed

## 2023-08-25 NOTE — Telephone Encounter (Signed)
**Note De-identified  [place] Obfuscation** Please advise 

## 2023-08-25 NOTE — Telephone Encounter (Signed)
 Pt reports sinus infection symptoms and would like prednisone  prescribed. Pt reports mild SOB only with activity, states this is no worse than her baseline. Denies CP. Pt has a sleep study tonight and states she had difficulty sleeping last night due to congestion. Pt's PCP is with [facility], this RN is unable to route there or make pt an appt there. Pt would appreciate follow-up.     FYI Only or Action Required?: Action required by provider  Patient was last seen by pulmology on 06/10/23 . Called Nurse Triage reporting Sinus Problem. Symptoms began several days ago. Interventions attempted: Rest, hydration, or home remedies. Symptoms are: gradually worsening.  Triage Disposition: See PCP Within 2 Weeks  Patient/caregiver understands and will follow disposition?:         Copied From CRM [id number]. Reason for Triage: Patient is stating she has a current sinus infection and is requesting prednisone  to assist her. Patient has a sleep study tonight and wants to be able to sleep tonight. Patient would like the rx for prednisone .   Reason for Disposition  [1] MILD longstanding difficulty breathing AND [2]  SAME as normal  Answer Assessment - Initial Assessment Questions 1. RESPIRATORY STATUS: "Describe your breathing?" (e.g., wheezing, shortness of breath, unable to speak, severe coughing)      "Very little", SOB only at rest (pt states this has improved since her last office visit) 2. ONSET: "When did this breathing problem begin?"      ongoing 3. PATTERN "Does the difficult breathing come and go, or has it been constant since it started?"      Comes and goes 4. SEVERITY: "How bad is your breathing?" (e.g., mild, moderate, severe)    - MILD: No SOB at rest, mild SOB with walking, speaks normally in sentences, can lie down, no retractions, pulse < 100.    - MODERATE: SOB at rest, SOB with minimal exertion and prefers to sit, cannot lie down flat, speaks in phrases, mild retractions,  audible wheezing, pulse 100-120.    - SEVERE: Very SOB at rest, speaks in single words, struggling to breathe, sitting hunched forward, retractions, pulse > 120      Mild, with activity only - no worse (states she was seen for this recently) 5. RECURRENT SYMPTOM: "Have you had difficulty breathing before?" If Yes, ask: "When was the last time?" and "What happened that time?"      yes 6. CARDIAC HISTORY: "Do you have any history of heart disease?" (e.g., heart attack, angina, bypass surgery, angioplasty)      HTN, CHF 7. LUNG HISTORY: "Do you have any history of lung disease?"  (e.g., pulmonary embolus, asthma, emphysema)     Asthma, sinusitis 8. CAUSE: "What do you think is causing the breathing problem?"      Sinus infection, SOB at baseline 9. OTHER SYMPTOMS: "Do you have any other symptoms? (e.g., dizziness, runny nose, cough, chest pain, fever)     5-7 sinus pain around her face, cheekbone pain, teeth hurt, headaches; gets this every year; nasal congestion (clear). Denies CP 10. O2 SATURATION MONITOR:  "Do you use an oxygen saturation monitor (pulse oximeter) at home?" If Yes, ask: "What is your reading (oxygen level) today?" "What is your usual oxygen saturation reading?" (e.g., 95%)       N/a, states she does not check  Protocols used: Breathing Difficulty-A-AH

## 2023-08-25 NOTE — Telephone Encounter (Signed)
 Ceftin 300 mg bic x 10 day Prednisone  10 mg take  4 each am x 2 days,   2 each am x 2 days,  1 each am x 2 days and stop   My notes say I wanted here to see ent if continued to have sinus issues so I would advise f/u with [facility] if she doesn't already have a ENT doc

## 2023-08-25 NOTE — Addendum Note (Signed)
 Addended by: Marina Boerner M on: 08/25/2023 02:28 PM   Modules accepted: Orders

## 2023-08-26 ENCOUNTER — Telehealth: Payer: Self-pay

## 2023-08-26 ENCOUNTER — Telehealth: Payer: Self-pay | Admitting: Gastroenterology

## 2023-08-26 NOTE — Telephone Encounter (Signed)
 Left message on machine to call back

## 2023-08-26 NOTE — Telephone Encounter (Signed)
-----   Message from Kerby Pearson III sent at 08/26/2023 12:34 PM EDT ----- This patient is currently scheduled for an EGD and colonoscopy with me on 09/01/2023.  When I saw her in the office, we scheduled this with the understanding that it would be dependent on her cardiac workup.  At that time, heart catheterization was planned by Dr. Julane Ny, but then I received a message from him that insurance had declined that and so a stress test is scheduled instead.  Looking at her appointment calendar, I cannot tell when that stress test will be.  Please contact this patient because we will need to put off her endoscopic procedures until after her stress test gets performed.  Dr. Bensimhon had messaged me that he felt the patient was low risk for endoscopy, but our anesthesia provider will want the results of the stress test prior to endoscopic procedures in the [facility].  If she can tell us  the date that stress test is scheduled, then her procedures could be tentatively moved to a new date that is at least 2 weeks afterward for time to get that stress test result.  If she does not yet have the date, then the procedures should not be rescheduled until we hear from her or her cardiologist that the stress test was done and a report generated.  Memory Staggers MD

## 2023-08-26 NOTE — Telephone Encounter (Signed)
 PT and her Daughter Mayola Specking are calling to speak about medications she can and cannot take prior to procedure. She is scheduled to have the procedure 6/11.

## 2023-08-26 NOTE — Procedures (Signed)
   [facility] Smalling [facility] [address] [place], [place] [address] Tel: [phone]   Fax: [phone]  Titration Interpretation  Patient Name:  [name], [name] Date:  08/25/2023 Referring Physician:  Dr. Gaylyn Keas  Indications for Polysomnography The patient is a 73 year old Female who is 5\' 8"  and weighs 235.0 lbs. Her BMI equals 36.0.  A full night titration treatment study was performed.  No medications were reported taken during the night.  No Data.   Polysomnogram Data A full night polysomnogram recorded the standard physiologic parameters including EEG, EOG, EMG, EKG, nasal and oral airflow.  Respiratory parameters of chest and abdominal movements were recorded with Respiratory Inductance Plethysmography belts.  Oxygen saturation was recorded by pulse oximetry.   Sleep Architecture The total recording time of the polysomnogram was 384.0 minutes.  The total sleep time was 209.5 minutes.  The patient spent 29.8% of total sleep time in Stage N1, 52.5% in Stage N2, 1.2% in Stages N3, and 16.5% in REM.  Sleep latency was 3.6 minutes.  REM latency was 88.0 minutes.  Sleep Efficiency was 54.6%.  Wake after Sleep Onset time was 170.5 minutes.  Titration Summary The patient was titrated at pressures ranging from 6 cm/H20 up to 21/17 cm/H20.  The last pressure used in the study was 21/17 cm/H20.  Respiratory Events The polysomnogram revealed a presence of 12 obstructive, 85 central, and 26 mixed apneas resulting in an Apnea index of 35.2 events per hour.  There were 52 hypopneas (>=3% desaturation and/or arousal) resulting in an Apnea\Hypopnea Index (AHI >=3% desaturation and/or arousal) of 50.1 events per hour.  There were 41 hypopneas (>=4% desaturation) resulting in an Apnea\Hypopnea Index (AHI >=4% desaturation) of 47.0 events per hour.  There were 9 Respiratory Effort Related Arousals resulting in a RERA index of 2.6 events per hour. The Respiratory  Disturbance Index is 52.7 events per hour.  The snore index was - events per hour.  Mean oxygen saturation was 94.8%.  The lowest oxygen saturation during sleep was 79.0%.  Time spent <=88% oxygen saturation was 45.2 minutes (12.3%).  Limb Activity There were 0 limb movements recorded  Cardiac Summary The average pulse rate was 54.3 bpm.  The minimum pulse rate was 43.0 bpm while the maximum pulse rate was 79.0 bpm.  Cardiac rhythm was normal.  Diagnosis:  Obstructive Sleep Apnea  Recommendations: Recommend a trial of ResMed BiPAP at 21/17cm H2O with heated humidity and medium ResMed Airfit 20 mask. The patient should be encouraged to avoid driving when sleepy. Encourage patient to avoid sleeping in the supine position. The patient should be counseled in good sleep hygiene.   This study was personally reviewed and electronically signed by: Dr. Gaylyn Keas Accredited Board Certified in Sleep Medicine Date/Time: 08/26/2023 1:27PM

## 2023-08-26 NOTE — Telephone Encounter (Signed)
 Spoke with the pt and went over the instructions at length.  No further questions at this time.                                                                                                           BRITTINEE RISK   DOB: 1950/06/22 MRN: [id number] Procedure Date: 09/01/23 ARRIVAL TIME: 2 pm    Location of Procedure: [facility] (4th Floor)                                         [address] [place] [address]                                         Check in at the receptionist's desk on the 4th floor before having a seat in the main lobby.  PLEASE ARRIVE AT 2 pm FOR YOUR PROCEDURE   Please contact our office if you have any changes to your medical history ( heart attack, stroke, etc) or if you start any new medications after your visit especially blood thinners or any new weight loss medications   _________________________________________________________________________________   PREPARATION FOR COLONOSCOPY WITH SUPREP  You can try the Singlecare.com or GoodRx for a generic Suprep coupon!!  Please pick up your prep from the pharmacy as soon as possible to ensure it is available.   STARTING FIVE DAYS BEFORE YOUR PROCEDURE:  STOP eating nuts, seeds, popcorn, corn, beans, peas, salads, or any raw vegetables. You may continue to eat regular diet besides these foods! Do not take any fiber supplements (e.g. Metamucil, Citrucel, and Benefiber) or IRON supplements.  _________________________________________________________________________________   THE DAY BEFORE PROCEDURE:     1. Drink clear liquids the entire day - NO SOLID FOOD  2. Do not drink anything colored red or purple. Avoid juices with pulp. No orange juice, grapefruit, or tomato juice. No Milk products. Avoid Alcohol.    3. Drink an 8-10 oz glass of clear liquids every hour during the day to prevent dehydration and help the prep work efficiently.    Clear liquids include:  NO RED & NO PURPLE   Water Jello- orange, green, blue, yellow   Ice Popsicles-Orange or Banana  Tea (sugar ok, no milk/cream) Powdered fruit flavored drinks  Coffee (sugar ok, no milk/cream) Gatorade/ Pedialyte   Juice: apple, white grape, white cranberry Lemonade, Kool-Aid  Clear bouillon,broth(vegetable,chicken,beef) Carbonated beverages (any kind)  Strained chicken noodle soup broth (no noodles or chicken) Hard Candy   FOLLOW THESE PREP INSTRUCTIONS, NOT INSTRUCTIONS ON SUPREP BOX.  THE DAY BEFORE PROCEDURE: At 6:00 pm: COMPLETE STEPS 1 THROUGH 4 BELOW:  Step 1: Pour ONE (1) 6-ounce bottle of SUPREP liquid into the mixing container. Step 2: Add cool drinking water to the 16-ounce line on the container and mix.   Step 3: Drink ALL of the  16-ounce diluted prep over 30 minutes. Step 4: You MUST drink an additional two (2) or more 16-ounce containers of water over the next one (1) hour.   Continue to drink clear liquids until bedtime.  ONLY DRINK ONE BOTTLE ( OR HALF)  OF PREP THE EVENING BEFORE YOUR PROCEDURE.   THE SECOND BOTTLE (OR HALF)  MUST BE CONSUMED 5 HOURS BEFORE THE PROCEDURE ON THE DAY OF THE COLONOSCOPY.  THE DAY OF PROCEDURE:   STAY ON CLEAR LIQUIDS, NO SOLID FOOD  Beginning 5 hours prior to your arrival time:  Complete steps 1-4 below:  Step 1: Pour ONE (1) 6-ounce bottle of SUPREP liquid into the mixing container. Step 2: Add cool drinking water to the 16-ounce line on the container and mix.   Step 3: Drink ALL of the 16-ounce diluted prep over 30 minutes.    Step 4: You MUST drink an additional two (2) or more 16-ounce containers of water over the next one (1) hour.  You may drink clear liquids until 3 hours prior to your arrival time.  You will need to stop drinking all liquids, gum, candy, or medicines at this time.     Drinking liquids of any kind in any amount within 3 hours of your arrival time,or eating ANY SOLID FOODS the day of your procedure will result in cancellation of your  procedure.   ______________________________________________________________________________    MEDICATION INSTRUCTIONS  Unless otherwise instructed, you should take regular prescription medications with a small sip of water as early as possible the morning of your procedure. You may continue taking your Aspirin. Hold all NSAIDS to include Meloxicam, Motrin , Ibuprofen , Aleve, Advil , Celebrex & Diclofenac , etc  7 days prior to the colonoscopy- you may take Tylenol    Take allowed medicines 3 hours before your arrival time    ORAL DIABETIC MEDICATION INSTRUCTIONS (Actos, Alogliptin, Farxiga, Metformin , Glipizide, Glimepiride, Invokana,  Jardiance , Janumet, Januvia, Rybelsus, Xigduo, Sitagliptin, Synjardy & Tradjenta )   The day before your procedure:  Take your diabetic pill as you do normally  The day of your procedure:  Do not take your diabetic pill   We will check your blood sugar levels during the admission process and again in Recovery before discharging you home  _____________________________________________________________________________   CARE PARTNER   You will need 1 responsible adult at least 75 years of age to act as your care partner.  This person needs to arrive with you ,stay here during your procedure in the 4th floor lobby and drive you home. Keep their cell phone available for our call. We cannot start your procedure unless your care partner is present in our facility. The total time from sign in until discharge is approximately 2-3 hours.    WHAT TO WEAR/BRING    Wear loose fitting clothing that is easily removed. FACE MASKS are optional for everyone. Do not wear any lotions, perfumes or colognes. You may wear deodorant.  Leave jewelry and other valuables at home. However, you may wish to bring a book to read.  Your belongings will be placed under your stretcher.  Remove all body piercing jewelry and leave at home.   Cell phones,Smart Watches, Fit Bits or any  other electronic devices cannot be used in patient care areas/ recovery room.  If you use any type of inhaler please bring them with you.  ACTIVITY   Your Care Partner should plan to take you directly home after your procedure.  You should plan to take it easy, moving  slowly for the rest of the day. DO NOT DRIVE, use heavy machinery, DRINK ANY ALCOHOL  and do not return to work.  You can resume all normal activity the following day.  CANCELLATION POLICY We require 7 calendar days advance notice for non-emergent cancellations of any procedure. Failure to give this notice will result in a fee:  $100 for a single procedure (upper or lower endoscopy) $200 for a double procedure (upper and lower endoscopy) ___________________________________________________________________  [facility] Reasons Your Endoscopic Procedure May Be DELAYED OR CANCELLED:  If NO designated "Care Partner" with you who will remain in the lobby the entire time of your procedure and recovery.    IF YOU DO NOT FOLLOW YOUR PROCEDURE INSTRUCTIONS. Drinking liquids of any kind in any amount WITHIN 3 HOURS of your scheduled procedure, or eating ANY SOLID FOODS or chewing tobacco the day of your procedure will result in cancellation of your procedure.    If during your pre-procedure assessment you have any abnormal respiratory or cardiac problems, shortness of breath, irregular heart rhythm, a low baseline oxygenation level, a dangerously high or low baseline blood pressure level or a high or low blood sugar reading.  If you have a FEVER or any new cold or flu symptoms contact us  immediately.   IF YOU HAVE ANY ER OR DOCTOR VISIT FOR AN ILLNESS, ESPECIALLY ANY HEART OR LUNG RELATED ISSUES, AFTER YOU HAVE YOUR GI VISIT, PLEASE CONTACT OUR OFFICE AT [phone] __________________________________________________________________  Please use MYCHART to communicate with GI providers for non-urgent requests or questions.  Please allow 48 business hours for a response. __________________________________________________________________  FREQUENTLY ASKED QUESTIONS   WHAT IS A COLONOSCOPY? After administering IV sedation, your physician passes a flexible, lighted instrument into the rectum to allow examination of the large intestine (colon). Small growths (polyps), if seen, may be removed (polypectomy).  HOW CAN I IMPROVE THE TASTE OF THE PREP?  All the solutions have a salty after taste. You can try any one or all of these suggestions to improve or overcome this taste: You may use a straw while drinking the prep  Hold hard candy in your mouth while drinking the solution.  Chase each glass with swallows of another beverage (juice, Coke, etc.).  Suck on a Popsicle or sucker while drinking the solution.  Chew flavored gum while drinking the solution. WHAT IF I GET SICK DURING THE PREP?  Stop drinking the solution and wait for 30-45 minutes. Let your system settle down. Try drinking small sips of Coke or other beverage.  Begin the solution again, using some of the suggestions above if the flavor is the problem.  DO I HAVE TO DRINK ALL THE PREP?  Yes.  In order to give you the best examination possible, it is important to drink all of the solution in the set amount of time.  In the event that you have tried everything suggested and still cannot complete the preparation, please call us  at [phone]. If it is after hours or on the weekend, the answering service will reach the doctor on call for you. WHEN WILL THE PREP START TO WORK?  Everyone is different in the amount of time that it takes the prep to work. 1-4 hours is average for most people to begin to have liquid stools any you may have mild abdominal bloating and cramping. Stay near the bathroom. Activity is helpful, remain active.  _____________________________________________________________   WHAT TYPES OF SEDATION ARE USED AT THE [facility]? There are two  types of  sedation.  Your gastroenterologist will decide which type you will need based on your personal medical issues as well as their own preference. An IV of Normal Saline is required.  Moderate Sedation is rarely used. If you need this sedation, your doctor will discuss this with you.  Deep Sedation (Monitored Anesthesia Care) is achieved using a short acting IV anesthetic (Diprivan , Propofol ) that promotes relaxation and sleep.  This medicine is administered by a CRNA (nurse anesthetist) skilled and credentialed in using Diprivan .  You should not remember the procedure itself, but you should be able to remember the discharge instructions and the discussion with your physician afterwards.  You will receive a separate bill for this type of sedation.  This type of sedation is generally more reliable for patients who take certain chronic medications or with certain medical conditions and so it may be preferred. If you have any questions about the type of sedation that will be used for your procedure, your gastroenterologist will be happy to discuss it with you. WHY DO I NEED A DRIVER?  The medicines used for your sedation cause delayed reflexes, impair thinking and judgment, and have some amnesic effect, therefore affecting your ability to drive safely. Even though you may feel alright, you are instructed to refrain from driving, operating any type of machinery, making any critical decisions, or signing any legal documents until the following day.  ___________________________________________________________________ CAN I WEAR DENTURES OR HEARING AIDES?  Yes, you may wear your dentures OR hearing aids. However, you may be asked to remove them prior to your procedure.  CAN I  WEAR MY CONTACTS?  We advise that you leave your contact lenses at home and wear your glasses instead. If you do wear you lenses, you may be asked to remove them prior to your procedure so please bring a case for them and also a pair of  glasses to wear after your procedure.  IS THE TEST SAFE DURING MY MENSTRUAL PERIOD?  Yes, your procedure can still be performed.  WILL THE DOCTOR TALK WITH ME AFTERWARDS? Yes, your physician will review the findings, follow-up care instructions, and treatment recommendations with you.  This will also be reviewed with your care partner if you have explicitly given us  permission.  It is important that your care partner realizes you may not remember much after the procedure due to the effects of the anesthesia and so they will need to be able to review this information with you later. HOW LONG WILL I BE THERE? The whole process takes 2-3 hours.  Most of that time is spent checking you in and waking you up safely after the procedure.  The colonoscopy itself takes 20-30 minutes usually.  Obviously, there may be unforseen delays and we will appreciate your patience if that occurs. WHAT TO EXPECT AFTER THE PROCEDURE?  Some feelings of bloating and passage of more gas is normal.  Walking can help get rid of the air and reduce the bloating. You may notice spotting of blood in your stool or on the toilet paper. Since you completed a bowel prep you may not have a normal bowel movement for a few days. WHAT CAN I EAT AFTER THE PROCEDURE?  We do recommend a small meal at first , but then you may proceed to your regular diet.   Drink plenty of fluids but avoid alcohol for 24 hours. WHAT IF THE WEATHER IS TERRIBLE?  Please CONTACT OUR OFFICE AT [phone] WHEN SEVERE WEATHER  IS FORECAST so we may advise you of any delays or closures. [facility] news will broadcast our weather closings and updates. We will make every effort to contact you by phone or [facility] if the [facility] is going to close or have a delayed opening due to weather.   SIGNATURES/CONFIDENTIALITY You have signed paperwork which will be entered into your electronic medical record.  This attests to the fact that that the information above has been reviewed and  is understood.  Full responsibility of the confidentiality lies with you and/or your care partner. ______________________________________________________________________________________  Alean Amen FINANCIAL RESPONSIBILITY If you have insurance, you will need to contact your insurance company to verify that you have active coverage and to determine the amount of coverage they will provide. It is important to tell them that you are having the procedure performed at an [facility]). They can tell you what portion of the cost will be your responsibility, usually expressed either as a fixed amount or as a percentage of overall cost. We will also be contacting your insurance company with information about your procedure in order to obtain pre-certification.  You must contact your insurance company as well, failure to do so may result in you having to pay a greater portion of the cost or even the total cost of the procedure.  YOU MAY RECEIVE THE FOLLOWING BILLS The [facility] will bill a facility fee for use of the procedure room, medication and supplies. Your [place] gastroenterologist will bill a professional fee for performing the procedure. If a biopsy is performed you will receive a bill from [facility] for their professional fee and a bill from [facility] for processing the pathology sample. If you receive diprivan  for sedation during your procedure, you will receive a bill from [facility] for the administration of the medication.   Complaints or questions regarding billing, payment by third party payers or payment plans can be directed to the Customer Service Department of Professional Fee Billing Services of the [facility], [address], [place], [place] [address].  Phone inquiries may be made at [facility] health [phone], Monday through Friday 8am to 5 p.m., or you may visit the [facility] website at: [email or web address], click on  "For Patients", then click "Questions about your bill. You have been scheduled for a colonoscopy with [facility]. We wanted you to know how your procedure will be presented to your insurance company for payment. Your procedure is being performed in the [facility]; we are considered an Ambulatory Surgical Center. The average, single procedure ranges from $1400 to $4000. We cannot bill your procedure as an " In office charge". You will receive separate bills for Professional, Facility, Pathology and Anesthesia Charges.  _____________________________________________________________________________________  Elene Griffes have been scheduled for a colonoscopy with [facility]. We wanted you to know how your procedure will be presented to your insurance company for payment. Your procedure is being performed in the [facility]; we are considered an Ambulatory Surgical Center. The average, single procedure ranges from $1400 to $4000. We cannot bill your procedure as an " In office charge". You will receive separate bills for Professional, Facility, Pathology and Anesthesia Charges.  Your colonoscopy will be classified as a:  ____ Diagnostic or therapeutic Colonoscopy- presented to the office with past or present gastrointestinal symptoms, polyps, GI diseases, or anemias.  ____ Surveillance or High-Risk Screening Colonoscopy- asymptomatic (no gastrointestinal symptoms either past or present), has a personal history of GI disease, personal  and or family history of colon polyps and or cancer. This covers patients that have surveillance procedures every 1-7 years. Not all insurance companies will cover a surveillance or High-Risk screening at 100%. Many insurance companies will cover this at 80% with the remainder the patient responsibility and not consider this a preventative screening.  _____ Preventative Colonoscopy Screening- asymptomatic (no gastrointestinal symptoms  either past or present). Patient is over the age of 69, has no personal or family history of GI disease, colon polyps, and or colon cancer. The patient has not undergone a colonoscopy within the last 10 years. Usually covered by most insurances at 100% every 10 years.  Your insurance may require you to meet a deductible before they will pay for your procedures. A deductible is a specified amount of money that the insured must pay before an insurance company will pay a claim. Co-insurance is the percentage of costs that you pay after you meet your deductible.  You will not need to bring any payment on the day of the procedure. Payment arrangements, if necessary, can be arranged once you receive your bill. Prior to your procedure, we encourage you to contact your insurance and discuss with them your policy coverages for the type of procedure you will have. Please inquire if the physician removes a polyp during your procedure, will this change your out-of- pocket expense? A biopsy or polyp removal may change a screening benefit to a medical necessity which equals more out of pocket expenses for you and varies by insurance carriers.  Can the physician change, add, or delete my diagnosis so that I can be considered a colon screening? NO!Aaron Aas The patient encounter is documented as a medical record from information you have provided. It is a binding, legal, document that cannot be changed to facilitate better insurance coverage.  Please call [phone] if you have any questions after speaking with your insurance carrier.  ________________________________________________________________________________________  [facility] ([facility])                                The [facility] ([facility]) is an independent, freestanding Ambulatory Surgery Center (ASC) located on the fourth floor of the [facility] at [address] [place], [place].  It is licensed by the [place], certified by [employer] and is accredited by [employer].   The [facility] physicians established the [facility] in 1992. The physicians of [facility] joined the [facility] in 1999, and the [facility] is now owned by [employer].  We completed a major renovation in 2006 and the expanded [facility] now provides greater privacy and comfort for our patients and their family members.  We have invested in state-of-the-art facilities and equipment to ensure that our patients receive the most up to date and best care.  At the [facility], board-certified gastroenterologists perform elective diagnostic and therapeutic endoscopic procedures such as endoscopy and colonoscopy.  Hours of operation are from 7:00 a.m. to 5:30 p.m. Monday - Friday.  Outside of the posted hours of operation, urgent or emergent care is provided at [facility] and [facility].  Our physicians also provide 24-hour emergency coverage.  After hours and on weekends, the on-call physician may be reached by calling [phone].  The answering service will take a message and have the physician on call contact you.  Your Rights and Responsibilities as Our Patient  This center is owned by [facility] and is governed by the Gastroenterologists of [employer]. You may exercise the following rights without being subjected to discrimination or reprisal.                                    PATIENT RIGHTS- You have a right to : * Considerate, respectful, and safe care that is free from abuse or harassment.  * A discussion of your illness, what we can do about it, and the likely outcome of care.  * Know the names and roles of the people caring for you here.  * Respectful and effective pain management.  * Receive as much information to consent to or refuse a course of treatment or invasive procedure and to actively  participate in decisions regarding your medical care.  * Involve your health care proxy or significant others in the decision making process for medical decisions.  * Reasonable continuity of care and to know in advance the time and location of an appointment as well as the doctor you are seeing.  * Full consideration of personal privacy and confidentiality of your medical information. Your written permission will be obtained prior to releasing any medical information. When we do release your information to others, we ask them to keep them confidential.  * Review your medical record and ask questions unless restricted by law.  * Know of any relationships with other parties that may influence your care.  * Know about rules that affect your care and about charges and payment methods. You have a right to receive and examine an explanation of your bill regardless of the source of payment.  * Receive assistance with the transfer of care from one doctor to another doctor within our practice or to an external doctor not in our practice.  * You have a right to develop a living will or healthcare power of attorney although, since the procedures that we do are not high risk, we will do all that is necessary to stabilize you including CPR if an emergency occurs. EMS will be called and you will be transferred to the hospital.  * Voice your concerns, complaints, or problems with the care you received by contacting our manager at [phone] or Nurse Manager at [phone]. If we are unable to satisfactorily address your complaint, you may contact the [facility] Board by phone at [phone], [facility] by phone at [phone] or online at [email or web address], or the [facility] Complaint Intake Unit by phone at [phone] or [phone], by mail at [facility], [place], [place], or online at [email or web address].                       PATIENT RESPONSIBILITIES-You agree to :  *  Provide accurate and complete information concerning your symptoms, past history, current health status, and medications including over-the-counter products and dietary supplements.  * Make known whether you clearly comprehend your medical care and what is expected of you in the plan of care.  * Participate in the development of the treatment plan and follow care instructions given to you.  * Follow the treatment plan and care instructions given to you.  * Keep appointments and notify us  if you are unable to do so.  * Accept responsibility for your actions if you refuse planned treatment or do not follow  your doctor's orders.  * Accept financial responsibility for care received and pay promptly.  * Follow facility policies and procedures.  * Inform my doctor about any living will, medical healthcare power of attorney, or other directive that may affect my medical care.  * Be respectful of all healthcare providers and staff as well as other patients.  * Inform the staff of any discomfort or pain and patient safety issues.  * Share your values, beliefs, and traditions to help the staff provide appropriate care.  * Provide a responsible adult to transport you home and remain with you if you receive sedation medications.                                            Additional Information and Resources    All issues, concerns, or complaints can be reported by contacting our Tourist information centre manager.  If we are unable to address your concerns, you may contact the following for assistance.  Medicare Ombudsman: 1-800-MEDICARE (1-610-960-4540). TTY users should call 586-242-0396 or use this website: MotivationalSites.cz [facility] Programs (SHIPS)- state insurance information, contacts, and forms:    www.shiptacenter.org  [place] [facility] Ombudsman 1- 301-762-8409 Tenna Fees-  [email or web address].[email or web address] .[email or web address]-bailey.[email or web address]/ http://logan.com/  [place]  DHHS Administration on Aging for community-based resources       DenimBuzz.com.ee   Advance Directives - Living Will or Health Care Power of Attorney Resources  For applicable state laws and sample forms for creating a living will or healthcare power of attorney, you may contact one of the following.  1.  Caring Information Organization at (276)373-1981 for English or 215-155-6745 for other languages or  www.caringinfo.org  2.  [place] DHHS Division of Aging and Adult Services at 934-014-1995 or   http://logan.com/        [place] - PokerBag.at-         Life_Healthcare_short.pdf          LuxuryExpense.at.pdf  3. [facility] at 212-385-5555 or 920-404-9689 or www.cchospice.org   Advance Directive Policy: Please be aware that the procedures that we do in this facility are not high risk and that in an emergency, we will do all that is necessary to stabilize you including CPR.  If you present to this center for a procedure with a living will or valid Do Not Resuscitate Order (DNR) or Out of Facility form and you have an emergency, we will do all that we can to stabilize your medical condition and we may start CPR.  We will call 911 to transport you to the hospital.  EMS will be informed of the Do Not Resuscitate Order or living will upon arrival.                                            COMPLAINT/GRIEVANCE PROCESS The [facility] recognizes that patients have the right to voice concerns without fear of discrimination or reprisal, and to have these concerns reviewed and responded to in a timely manner. [facility] seeks to provide prompt review and timely resolution of complaints or grievances from any patient.    You may voice your concerns, complaints, or problems with the care  you have received or are receiving to any staff member at any point in your care.  Every effort will be made to reconcile your concern or complaint while you are still in the [facility].  However, if you wish to voice a concern or complaint after you have left the center, you may contact the Nursing Supervisor at [phone] or our Administrative Director of Gastroenterology at [phone].  If we are unable to satisfactorily address your complaint, you may contact the [place]  Department of Health & Human Services, Complaint Intake Unit ([address] [place], [address] Mail Service [place], [place], [place] [address], phone: [phone] or [phone]) or by e-mail at [email or web address].[email or web address] /[email or web address].  You may also contact the Office of the Medicare Ombudsman to file a grievance at 800 MEDICARE (800 615-439-9526) or at [email or web address]

## 2023-08-27 ENCOUNTER — Telehealth: Payer: Self-pay

## 2023-08-27 NOTE — Telephone Encounter (Signed)
-----   Message from Gaylyn Keas sent at 08/26/2023  1:28 PM EDT ----- Please let patient know that they had a successful PAP titration and let DME know that orders are in EPIC.  Please set up 6 week OV with me.

## 2023-08-27 NOTE — Telephone Encounter (Signed)
 Notified patient of Titration results and recommendations. All questions were answered and patient verbalized understanding. Order sent to [facility] today.

## 2023-08-30 NOTE — Telephone Encounter (Signed)
 I spoke with the pt and she has not been set up for stress yet. She will get that set up and call back to reschedule the endo colon.

## 2023-09-01 ENCOUNTER — Encounter: Admitting: Gastroenterology

## 2023-09-13 ENCOUNTER — Ambulatory Visit (HOSPITAL_BASED_OUTPATIENT_CLINIC_OR_DEPARTMENT_OTHER): Payer: Self-pay

## 2023-09-13 NOTE — Telephone Encounter (Addendum)
 FYI Only or Action Required?: Action required by provider: request for appointment.  Patient is followed in Pulmonology for cough, last seen on 06/10/2023 by Darlean Ozell NOVAK, MD. Called Nurse Triage reporting Breathing Problem. Symptoms began several months ago. Interventions attempted: Rescue inhaler, Maintenance inhaler, and Nebulizer treatments. Symptoms are: unchanged.  Triage Disposition: See PCP Within 2 Weeks  Patient/caregiver understands and will follow disposition?: No, wishes to speak with PCP   Triager offered to schedule with alternate provider, pt declines and only wishes to see Dr. Jude. Triager will forward encounter for Dr. Jude 's office to review and see if pt can be accommodated. Patient verbalized understanding and is expecting call back from office for availabilities.    Copied from CRM [id number]. Topic: Clinical - Red Word Triage >> Sep 13, 2023 11:23 AM Rilla NOVAK wrote: [facility] that prompted transfer to Nurse Triage:  Diff Breathing   ----------------------------------------------------------------------- From previous Reason for Contact - Scheduling: Patient/patient representative is calling to schedule an appointment. Refer to attachments for appointment information. Reason for Disposition  [1] MILD longstanding difficulty breathing AND [2]  SAME as normal  Answer Assessment - Initial Assessment Questions E2C2 Pulmonary Triage - Initial Assessment Questions Chief Complaint (e.g., cough, sob, wheezing, fever, chills, sweat or additional symptoms) *Go to specific symptom protocol after initial questions. Difficulty breathing, some productive clear cough  How long have symptoms been present? Few months  Have you tested for COVID or Flu? Note: If not, ask patient if a home test can be taken. If so, instruct patient to call back for positive results. No  MEDICINES:   Have you used any OTC meds to help with symptoms? No If yes, ask What  medications? N/a  Have you used your inhalers/maintenance medication? Yes If yes, What medications? Albuterol  INH - last this AM with no relief, pt reports tries to take it daily  If inhaler, ask How many puffs and how often? Note: Review instructions on medication in the chart. See above  OXYGEN: Do you wear supplemental oxygen? No If yes, How many liters are you supposed to use? N/a  Do you monitor your oxygen levels? No If yes, What is your reading (oxygen level) today? N/a  What is your usual oxygen saturation reading?  (Note: Pulmonary O2 sats should be 90% or greater) N/a      1. RESPIRATORY STATUS: Describe your breathing? (e.g., wheezing, shortness of breath, unable to speak, severe coughing)      See above 2. ONSET: When did this breathing problem begin?      See above 3. PATTERN Does the difficult breathing come and go, or has it been constant since it started?      SOB is consistent Coughing intermittent 4. SEVERITY: How bad is your breathing? (e.g., mild, moderate, severe)    - MILD: No SOB at rest, mild SOB with walking, speaks normally in sentences, can lie down, no retractions, pulse < 100.    - MODERATE: SOB at rest, SOB with minimal exertion and prefers to sit, cannot lie down flat, speaks in phrases, mild retractions, audible wheezing, pulse 100-120.    - SEVERE: Very SOB at rest, speaks in single words, struggling to breathe, sitting hunched forward, retractions, pulse > 120      Mild - moderate Triager does not appreciate audible SOB/wheezing during call. Pt is speaking in full sentences.  5. RECURRENT SYMPTOM: Have you had difficulty breathing before? If Yes, ask: When was the last time? and What happened  that time?      First time 6. CARDIAC HISTORY: Do you have any history of heart disease? (e.g., heart attack, angina, bypass surgery, angioplasty)      CHF per chart 7. LUNG HISTORY: Do you have any history of lung  disease?  (e.g., pulmonary embolus, asthma, emphysema)     Asthma per chart 8. CAUSE: What do you think is causing the breathing problem?      unknown 9. OTHER SYMPTOMS: Do you have any other symptoms? (e.g., dizziness, runny nose, cough, chest pain, fever)     denies 10. O2 SATURATION MONITOR:  Do you use an oxygen saturation monitor (pulse oximeter) at home? If Yes, ask: What is your reading (oxygen level) today? What is your usual oxygen saturation reading? (e.g., 95%)       N/a 11. PREGNANCY: Is there any chance you are pregnant? When was your last menstrual period?       N/a 12. TRAVEL: Have you traveled out of the country in the last month? (e.g., travel history, exposures)       N/a  Protocols used: Breathing Difficulty-A-AH

## 2023-09-13 NOTE — Telephone Encounter (Signed)
 Spoke with [name] she is just wanting to get into see Dr Jude she is not having difficulty breathing she is aware we do not have a schedule for him yet for Sept she voices understanding and will call back in July to see if she can get on the schedule. She is aware if worsening or have trouble breathing she can proceed to [facility] or ED

## 2023-09-15 ENCOUNTER — Telehealth: Payer: Self-pay

## 2023-09-15 NOTE — Telephone Encounter (Signed)
 Copied from CRM [id number]. Topic: Clinical - Medication Question >> Aug 31, 2023 12:13 PM Devaughn RAMAN wrote: Reason for CRM: Patient called stating she just picked up her refill bisoprolol  (ZEBETA ) 5 MG tablet from [facility] and going forward she would like this medication to be refilled at [facility], she provided a fax number of [phone]. Patient was thankful and expressed no other needs at this time.  NFN.

## 2023-11-03 ENCOUNTER — Ambulatory Visit (INDEPENDENT_AMBULATORY_CARE_PROVIDER_SITE_OTHER): Admitting: Otolaryngology

## 2023-11-03 ENCOUNTER — Encounter (INDEPENDENT_AMBULATORY_CARE_PROVIDER_SITE_OTHER): Payer: Self-pay | Admitting: Otolaryngology

## 2023-11-03 VITALS — BP 170/83 | HR 66

## 2023-11-03 DIAGNOSIS — J310 Chronic rhinitis: Secondary | ICD-10-CM

## 2023-11-03 DIAGNOSIS — R0981 Nasal congestion: Secondary | ICD-10-CM | POA: Diagnosis not present

## 2023-11-03 DIAGNOSIS — H9012 Conductive hearing loss, unilateral, left ear, with unrestricted hearing on the contralateral side: Secondary | ICD-10-CM | POA: Diagnosis not present

## 2023-11-03 DIAGNOSIS — J342 Deviated nasal septum: Secondary | ICD-10-CM

## 2023-11-03 DIAGNOSIS — J343 Hypertrophy of nasal turbinates: Secondary | ICD-10-CM

## 2023-11-03 DIAGNOSIS — H9312 Tinnitus, left ear: Secondary | ICD-10-CM | POA: Diagnosis not present

## 2023-11-03 DIAGNOSIS — J324 Chronic pansinusitis: Secondary | ICD-10-CM

## 2023-11-06 DIAGNOSIS — J324 Chronic pansinusitis: Secondary | ICD-10-CM | POA: Insufficient documentation

## 2023-11-06 NOTE — Progress Notes (Signed)
 Patient ID: [name], female   DOB: 04/17/1950, 73 y.o.   MRN: [id number]  Follow-up: Left ear tinnitus, left ear hearing loss  New complaint: Recurrent sinusitis  HPI: The patient is a 73 year old female who presents today with a new complaint of recurrent sinusitis for the past year.  The patient was previously seen for her left ear tinnitus and left ear conductive hearing loss.  The hearing loss was likely a result of otosclerosis.  The strategies to cope with her tinnitus were discussed.  The patient returns today reporting no significant change in her hearing or tinnitus.  However, she has noted recurrent sinusitis over the past year.  She complains of frequent facial pain and pressure, nasal congestion, and headaches.  She was treated with multiple courses of antibiotics and steroids.  She has a history of environmental allergies.  She uses over-the-counter allergy medications and Flonase nasal spray as needed.  She has no previous ENT surgery.  Exam: General: Communicates without difficulty, well nourished, no acute distress. Head: Normocephalic, no evidence injury, no tenderness, facial buttresses intact without stepoff. Face/sinus: No tenderness to palpation and percussion. Facial movement is normal and symmetric. Eyes: PERRL, EOMI. No scleral icterus, conjunctivae clear. Neuro: CN II exam reveals vision grossly intact.  No nystagmus at any point of gaze. Ears: Auricles well formed without lesions.  Ear canals are intact without mass or lesion.  No erythema or edema is appreciated.  The TMs are intact without fluid. Nose: External evaluation reveals normal support and skin without lesions.  Dorsum is intact.  Anterior rhinoscopy reveals congested mucosa over anterior aspect of inferior turbinates and intact septum.  No purulence noted. Oral:  Oral cavity and oropharynx are intact, symmetric, without erythema or edema.  Mucosa is moist without lesions. Neck: Full range of motion without pain.   There is no significant lymphadenopathy.  No masses palpable.  Thyroid bed within normal limits to palpation.  Parotid glands and submandibular glands equal bilaterally without mass.  Trachea is midline. Neuro:  CN 2-12 grossly intact.   Procedure:  Flexible Nasal Endoscopy: Description: Risks, benefits, and alternatives of flexible endoscopy were explained to the patient.  Specific mention was made of the risk of throat numbness with difficulty swallowing, possible bleeding from the nose and mouth, and pain from the procedure.  The patient gave oral consent to proceed.  The flexible scope was inserted into the right nasal cavity.  Endoscopy of the interior nasal cavity, superior, inferior, and middle meatus was performed. The sphenoid-ethmoid recess was examined. Edematous mucosa was noted.  No polyp, mass, or lesion was appreciated. Nasal septal deviation noted. Olfactory cleft was clear.  Nasopharynx was clear.  Turbinates were hypertrophied but without mass.  The procedure was repeated on the contralateral side with similar findings.  The patient tolerated the procedure well.   Assessment: 1.  Recurrent rhinosinusitis, with nasal mucosal congestion, nasal septal deviation, and bilateral inferior turbinate hypertrophy. 2.  Subjectively stable asymmetric left ear conductive hearing loss and left ear tinnitus.  The conductive hearing loss is likely a result of otosclerosis.  Plan: 1.  The physical exam and nasal endoscopy findings are reviewed with the patient. 2.  Flonase nasal spray 2 sprays each nostril daily. 3.  Nasal saline irrigation daily. 4.  Sinus CT scan to evaluate for chronic rhinosinusitis. 5.  The strategies to cope with tinnitus are also reviewed. 6.  The patient will return for evaluation after the CT scan.

## 2023-11-06 NOTE — Addendum Note (Signed)
 Addended byBETHA KARIS CLUNES on: 11/06/2023 10:13 AM   Modules accepted: Orders

## 2023-11-09 ENCOUNTER — Inpatient Hospital Stay (HOSPITAL_COMMUNITY): Admission: RE | Admit: 2023-11-09 | Source: Ambulatory Visit

## 2023-11-11 ENCOUNTER — Ambulatory Visit (HOSPITAL_COMMUNITY)
Admission: RE | Admit: 2023-11-11 | Discharge: 2023-11-11 | Disposition: A | Discharge status: Home / Self Care | Source: Ambulatory Visit | Attending: Otolaryngology | Admitting: Otolaryngology

## 2023-11-11 DIAGNOSIS — J324 Chronic pansinusitis: Secondary | ICD-10-CM | POA: Diagnosis present

## 2023-11-18 ENCOUNTER — Other Ambulatory Visit (HOSPITAL_BASED_OUTPATIENT_CLINIC_OR_DEPARTMENT_OTHER): Payer: Self-pay

## 2023-11-18 ENCOUNTER — Other Ambulatory Visit: Payer: Self-pay | Admitting: Internal Medicine

## 2023-11-18 MED ORDER — BISOPROLOL FUMARATE 5 MG PO TABS: ORAL_TABLET | ORAL | 2 refills | Status: AC

## 2023-11-18 NOTE — Telephone Encounter (Signed)
 Copied from CRM [id number]. Topic: Clinical - Medication Refill >> Nov 18, 2023  9:17 AM Devaughn RAMAN wrote: Medication: bisoprolol  (ZEBETA ) 5 MG tablet  Has the patient contacted their pharmacy? Yes (Agent: If no, request that the patient contact the pharmacy for the refill. If patient does not wish to contact the pharmacy document the reason why and proceed with request.) (Agent: If yes, when and what did the pharmacy advise?)  This is the patient's preferred pharmacy:  [facility] - [place], [place] - [address] [facility] [address] [place] [address] [place] [address] Phone: [phone] Fax: [phone]    Is this the correct pharmacy for this prescription? Yes If no, delete pharmacy and type the correct one.   Has the prescription been filled recently? No  Is the patient out of the medication? No, but patient states she is getting low.  Has the patient been seen for an appointment in the last year OR does the patient have an upcoming appointment? Yes  Can we respond through MyChart? No  Agent: Please be advised that Rx refills may take up to 3 business days. We ask that you follow-up with your pharmacy.

## 2023-11-26 ENCOUNTER — Telehealth (HOSPITAL_COMMUNITY): Payer: Self-pay

## 2023-11-30 ENCOUNTER — Telehealth (HOSPITAL_COMMUNITY): Payer: Self-pay | Admitting: *Deleted

## 2023-11-30 NOTE — Telephone Encounter (Signed)
 Reaching out to patient to offer assistance regarding upcoming cardiac imaging study; pt verbalizes understanding of appt date/time, parking situation and where to check in, pre-test NPO status and verified current allergies; name and call back number provided for further questions should they arise  [name] Brick RN Navigator Cardiac Imaging [facility] [phone] office [phone] cell  Patient aware to avoid caffeine 12 hours prior to her cardiac PET scan.

## 2023-12-01 ENCOUNTER — Ambulatory Visit (HOSPITAL_COMMUNITY)
Admission: RE | Admit: 2023-12-01 | Discharge: 2023-12-01 | Disposition: A | Discharge status: Home / Self Care | Source: Ambulatory Visit | Attending: Internal Medicine | Admitting: Internal Medicine

## 2023-12-01 ENCOUNTER — Encounter (HOSPITAL_COMMUNITY): Payer: Self-pay | Admitting: Emergency Medicine

## 2023-12-01 DIAGNOSIS — R0609 Other forms of dyspnea: Secondary | ICD-10-CM | POA: Diagnosis present

## 2023-12-01 LAB — NM PET CT CARDIAC PERFUSION MULTI W/ABSOLUTE BLOODFLOW
LV dias vol: 92 mL (ref 46–106)
LV sys vol: 39 mL (ref 3.8–5.2)
MBFR: 2.11
Nuc Rest EF: 58 %
Nuc Stress EF: 64 %
Rest MBF: 1.16 ml/g/min
Rest Nuclear Isotope Dose: 27.8 mCi
ST Depression (mm): 0 mm
Stress MBF: 2.45 ml/g/min
Stress Nuclear Isotope Dose: 27.8 mCi

## 2023-12-01 MED ORDER — REGADENOSON 0.4 MG/5ML IV SOLN
INTRAVENOUS | Status: AC
  Filled 2023-12-01: qty 5

## 2023-12-01 MED ORDER — RUBIDIUM RB82 GENERATOR (RUBYFILL)
28.0000 | PACK | Freq: Once | INTRAVENOUS | Status: AC
Start: 2023-12-01 — End: 2023-12-01
  Administered 2023-12-01: 27.79 via INTRAVENOUS

## 2023-12-01 MED ORDER — REGADENOSON 0.4 MG/5ML IV SOLN
0.4000 mg | Freq: Once | INTRAVENOUS | Status: AC
Start: 2023-12-01 — End: 2023-12-01
  Administered 2023-12-01: 0.4 mg via INTRAVENOUS

## 2023-12-01 MED ORDER — RUBIDIUM RB82 GENERATOR (RUBYFILL)
28.0000 | PACK | Freq: Once | INTRAVENOUS | Status: AC
  Administered 2023-12-01: 27.78 via INTRAVENOUS

## 2023-12-05 ENCOUNTER — Ambulatory Visit (HOSPITAL_COMMUNITY): Payer: Self-pay | Admitting: Internal Medicine

## 2023-12-06 ENCOUNTER — Other Ambulatory Visit (INDEPENDENT_AMBULATORY_CARE_PROVIDER_SITE_OTHER): Payer: Self-pay | Admitting: Otolaryngology

## 2023-12-06 ENCOUNTER — Telehealth (INDEPENDENT_AMBULATORY_CARE_PROVIDER_SITE_OTHER): Payer: Self-pay

## 2023-12-06 NOTE — Telephone Encounter (Signed)
 Pt aware and voiced understanding

## 2023-12-06 NOTE — Telephone Encounter (Signed)
 Pt left a voicemail wanting to know if her results are in yet and requested a call back.

## 2023-12-07 ENCOUNTER — Telehealth (INDEPENDENT_AMBULATORY_CARE_PROVIDER_SITE_OTHER): Payer: Self-pay | Admitting: Otolaryngology

## 2023-12-07 NOTE — Telephone Encounter (Signed)
 Per Dr.Teoh, I told patient that her sinus CT showed Chromic sinusitis. She will follow up on 12/10/23 to review and discuss options.

## 2023-12-10 ENCOUNTER — Ambulatory Visit (INDEPENDENT_AMBULATORY_CARE_PROVIDER_SITE_OTHER): Admitting: Otolaryngology

## 2023-12-13 ENCOUNTER — Encounter (INDEPENDENT_AMBULATORY_CARE_PROVIDER_SITE_OTHER): Payer: Self-pay | Admitting: Otolaryngology

## 2023-12-13 ENCOUNTER — Ambulatory Visit (INDEPENDENT_AMBULATORY_CARE_PROVIDER_SITE_OTHER): Admitting: Otolaryngology

## 2023-12-13 VITALS — BP 165/85 | HR 73 | Temp 98.4°F | Ht 68.0 in | Wt 235.0 lb

## 2023-12-13 DIAGNOSIS — J3489 Other specified disorders of nose and nasal sinuses: Secondary | ICD-10-CM

## 2023-12-13 DIAGNOSIS — J338 Other polyp of sinus: Secondary | ICD-10-CM

## 2023-12-13 DIAGNOSIS — J324 Chronic pansinusitis: Secondary | ICD-10-CM

## 2023-12-13 DIAGNOSIS — J342 Deviated nasal septum: Secondary | ICD-10-CM

## 2023-12-13 DIAGNOSIS — J343 Hypertrophy of nasal turbinates: Secondary | ICD-10-CM | POA: Diagnosis not present

## 2023-12-14 DIAGNOSIS — J3489 Other specified disorders of nose and nasal sinuses: Secondary | ICD-10-CM | POA: Insufficient documentation

## 2023-12-14 DIAGNOSIS — J338 Other polyp of sinus: Secondary | ICD-10-CM | POA: Insufficient documentation

## 2023-12-14 NOTE — Progress Notes (Signed)
 Patient ID: [name], female   DOB: 07-10-1950, 73 y.o.   MRN: [id number]  Follow-up: Chronic rhinosinusitis, chronic nasal obstruction  HPI: The patient is a 73 year old female who returns today for follow-up evaluation.  The patient has a history of chronic rhinosinusitis, with frequent recurrent exacerbations.  She was treated with multiple courses of antibiotics and steroids.  At her last visit, she was also noted to have nasal septal deviation and bilateral inferior turbinate hypertrophy.  She was treated with Flonase  nasal spray, nasal saline irrigation, and allergy medications.  Her most recent CT scan shows chronic rhinosinusitis, involving her right frontal, right ethmoid, right sphenoid, and bilateral maxillary sinuses.  In addition, she also has nasal septal deviation, bilateral inferior turbinate hypertrophy, and bilateral concha bullosa.  The patient returns today complaining of persistent nasal obstruction.  She also has frequent facial pressure and discomfort.  She is interested in more definitive treatment.  Exam: General: Communicates without difficulty, well nourished, no acute distress. Head: Normocephalic, no evidence injury, no tenderness, facial buttresses intact without stepoff. Face/sinus: No tenderness to palpation and percussion. Facial movement is normal and symmetric. Eyes: PERRL, EOMI. No scleral icterus, conjunctivae clear. Neuro: CN II exam reveals vision grossly intact.  No nystagmus at any point of gaze. Ears: Auricles well formed without lesions.  Ear canals are intact without mass or lesion.  No erythema or edema is appreciated.  The TMs are intact without fluid. Nose: External evaluation reveals normal support and skin without lesions.  Dorsum is intact.  Anterior rhinoscopy reveals congested mucosa over anterior aspect of inferior turbinates and deviated septum.  No purulence noted. Oral:  Oral cavity and oropharynx are intact, symmetric, without erythema or edema.   Mucosa is moist without lesions. Neck: Full range of motion without pain.  There is no significant lymphadenopathy.  No masses palpable.  Thyroid  bed within normal limits to palpation.  Parotid glands and submandibular glands equal bilaterally without mass.  Trachea is midline. Neuro:  CN 2-12 grossly intact.   Assessment: 1.  Chronic rhinosinusitis, involving the right frontal, right ethmoid, right sphenoid, and bilateral maxillary sinuses. 2.  Chronic nasal obstruction, secondary to nasal septal deviation, bilateral inferior turbinate hypertrophy, and bilateral concha bullosa. 3.  The patient has not responded to extensive medical treatment over the past year.  Plan: 1.  The physical exam findings and the CT images are extensively reviewed with the patient. 2.  Continue with Flonase  nasal spray and nasal saline irrigation daily. 3.  Based on the above findings, the patient will benefit from surgical intervention with septoplasty, bilateral inferior turbinate reduction, bilateral concha bullosa resection, and endoscopic sinus surgery to treat the involved sinuses. 4.  The risk, benefits, and details of the procedures are extensively discussed.  Questions are invited and answered. 5.  The patient would like to proceed with the procedures.

## 2024-01-05 ENCOUNTER — Encounter (HOSPITAL_COMMUNITY): Payer: Self-pay

## 2024-01-06 ENCOUNTER — Other Ambulatory Visit: Payer: Self-pay

## 2024-01-06 ENCOUNTER — Encounter (HOSPITAL_COMMUNITY): Payer: Self-pay | Admitting: Otolaryngology

## 2024-01-06 NOTE — Anesthesia Preprocedure Evaluation (Addendum)
 Anesthesia Evaluation  Patient identified by MRN, date of birth, ID band Patient awake    Reviewed: Allergy & Precautions, NPO status , Patient's Chart, lab work & pertinent test results, reviewed documented beta blocker date and time   Airway Mallampati: II  TM Distance: >3 FB Neck ROM: Full    Dental no notable dental hx. (+) Teeth Intact, Dental Advisory Given   Pulmonary asthma , sleep apnea    Pulmonary exam normal breath sounds clear to auscultation       Cardiovascular hypertension, Pt. on home beta blockers and Pt. on medications Normal cardiovascular exam+ dysrhythmias  Rhythm:Regular Rate:Normal  TTE 2024 1. Left ventricular ejection fraction, by estimation, is 55 to 60%. The  left ventricle has normal function. The left ventricle has no regional  wall motion abnormalities. There is septal-lateral dyssynchrony consistent  with LBBB. There is mild concentric   left ventricular hypertrophy. Left ventricular diastolic parameters are  consistent with Grade I diastolic dysfunction (impaired relaxation).   2. Right ventricular systolic function is normal. The right ventricular  size is normal. Tricuspid regurgitation signal is inadequate for assessing  PA pressure.   3. The mitral valve is normal in structure. No evidence of mitral valve  regurgitation. No evidence of mitral stenosis.   4. The aortic valve is tricuspid. Aortic valve regurgitation is not  visualized. No aortic stenosis is present.   5. The inferior vena cava is normal in size with greater than 50%  respiratory variability, suggesting right atrial pressure of 3 mmHg.   Stress 2024  There was no ST segment deviation noted during stress.  The left ventricular ejection fraction is hyperdynamic (>65%).  Nuclear stress EF: 72%.  The study is normal.  This is a low risk study.    Neuro/Psych  PSYCHIATRIC DISORDERS Anxiety Depression    negative  neurological ROS     GI/Hepatic negative GI ROS, Neg liver ROS,GERD  ,,  Endo/Other  diabetes, Type 2, Oral Hypoglycemic Agents    Renal/GU Renal InsufficiencyRenal disease  negative genitourinary   Musculoskeletal  (+) Arthritis ,    Abdominal   Peds  Hematology negative hematology ROS (+)   Anesthesia Other Findings   Reproductive/Obstetrics                              Anesthesia Physical Anesthesia Plan  ASA: 3  Anesthesia Plan: General   Post-op Pain Management:    Induction: Intravenous  PONV Risk Score and Plan: 3 and Dexamethasone , Ondansetron  and Treatment may vary due to age or medical condition  Airway Management Planned: Oral ETT  Additional Equipment:   Intra-op Plan:   Post-operative Plan: Extubation in OR  Informed Consent: I have reviewed the patients History and Physical, chart, labs and discussed the procedure including the risks, benefits and alternatives for the proposed anesthesia with the patient or authorized representative who has indicated his/her understanding and acceptance.     Dental advisory given  Plan Discussed with: CRNA  Anesthesia Plan Comments: (PAT note written 01/06/2024 by Allison Zelenak, PA-C.  )         Anesthesia Quick Evaluation

## 2024-01-06 NOTE — Progress Notes (Signed)
 Anesthesia Chart Review: SAME DAY WORK-UP  Case: [id number] Date/Time: 01/07/24 1300   Procedure: SURGERY, PARANASAL SINUS, ENDOSCOPIC, WITH NASAL SEPTOPLASTY, TURBINOPLASTY, AND MAXILLARY SINUSOTOMY (Bilateral) - Right endoscopic total ethmoidectomy and sphenoidotomy with polyp removal, right endoscopic frontal sinusotomy, bilateral maxillary antrostomy with polyp removal, septoplasty, bilateral inferior turbinate reduction, bilateral concha bullosa resection, FUSION   Anesthesia type: General   Diagnosis:      Deviated nasal septum [J34.2]     Hypertrophy of nasal turbinates [J34.3]     Chronic pansinusitis [J32.4]   Pre-op diagnosis:      Deviated nasal septum      Hypertrophy of nasal turbinates     Chronic pansinusitis   [facility]: [facility] ROOM 08 / [facility]   Surgeons: Karis Clunes, MD       DISCUSSION: Patient is a 73 year old female scheduled for the above procedure.  History includes smoker, HTN, HLD, left BBB, chronic diastolic CHF, DM2, CKD (stage 3), asthma, GERD, nephrolithiasis, breast cancer (left IDC, s/p bilateral mastectomies 02/04/2010, s/p Herceptin  and letrozole ), anemia, migraines, spinal surgery (C4-6 ACDF 10/26/2003), urethral stricture (s/p dilation 12/26/2019).  She is followed at the [facility] by Dr. Cherrie. She is s/p bilateral mastectomies for left breast cancer in 2011. She completed Herceptin  in January 2013. Last TTE showed EF 55-60%, grade 1 diastolic dysfunction. When he evaluated her on 07/20/2023 she reported DOE and occasional chest pressure. She went to the ED on 07/02/2023. BNP and Troponin were normal. She was under a lot of stress. She had gained 10 lbs over 2 years. He considered RHC/LHC, but insurance denied and requested other modality of testing be done. He ordered a cardiac PET Scan which she had on 12/01/2023 and showed LVEF 58%, stress EF 64%, mild coronary calcifications in the LAD, stress perfusion images were equivocal due to low counts, motion,  and extra cardiac activity, but the study was felt to be low risk. Dr. Cherrie reviewed on 12/05/2023 and wrote, Normal stress test.  She is a same day work-up. Anesthesia team to evaluate on the day of surgery. She has known CKD stage 3 (Cr ~ 1.43-1.91 in [facility] since June 2023).    VS: LMP 12/31/1970  Wt Readings from Last 3 Encounters:  12/13/23 106.6 kg  08/25/23 106.6 kg  08/04/23 106.6 kg   BP Readings from Last 3 Encounters:  12/13/23 (!) 165/85  12/01/23 133/63  11/03/23 (!) 170/83   Pulse Readings from Last 3 Encounters:  12/13/23 73  11/03/23 66  08/04/23 75     PROVIDERS: Leontine Cramp, NP is PCP  Cherrie Sieving, MD is HF cardiologist (Cardio-Oncology Clinic) Jerrye Mar, MD is nephrologist Odean Potts, MD is HEM-ONC   LABS: Updated labs on arrival as indicated. Most recent labs results in [facility] include: Lab Results  Component Value Date   WBC 7.2 07/20/2023   HGB 11.3 (L) 07/20/2023   HCT 33.9 (L) 07/20/2023   PLT 221 07/20/2023   GLUCOSE 66 (L) 07/20/2023   NA 140 07/20/2023   K 4.0 07/20/2023   CL 106 07/20/2023   CREATININE 1.54 (H) 07/20/2023   BUN 22 07/20/2023   CO2 25 07/20/2023     Sleep/PAP Titration Study 08/25/2023: Titration Summary The patient was titrated at pressures ranging from 6 cm/H20 up to 21/17 cm/H20.  The last pressure used in the study was 21/17 cm/H20. Recommendations: Recommend a trial of ResMed BiPAP at 21/17cm H2O with heated humidity and medium ResMed Airfit 20 mask...   IMAGES: CT  Chest (over read NM PET CT Myocardial) 12/01/2023: IMPRESSION: - No acute findings in the imaged extracardiac chest. - Aortic Atherosclerosis (ICD10-I70.0). - Hepatic steatosis.  CT Sinus 11/11/2023: IMPRESSION: 1. Moderate mucosal thickening of the right frontal sinus, scattered ethmoid air cells, right sphenoid sinus and inferior maxillary sinuses. 2. Opacified right frontoethmoidal recess, right sphenoethmoidal recess and bilateral  maxillary ostia.  EKG: EKG 4/112025: Normal sinus rhythm Left bundle branch block Abnormal ECG When compared with ECG of 07-Sep-2022 14:42, No significant change was found Confirmed by Griselda Norris [phone]) on 07/03/2023 11:06:33 AM   CV: NM PET CT Cardiac Perfusion Study 12/01/2023:   LV perfusion is equivocal.   Rest left ventricular function is normal. Rest EF: 58%. Stress left ventricular function is normal. Stress EF: 64%. End diastolic cavity size is normal. End systolic cavity size is normal.   Myocardial blood flow was computed to be 1.71ml/g/min at rest and 2.45ml/g/min at stress. Global myocardial blood flow reserve was 2.11 and was normal.   Coronary calcium  was present on the attenuation correction CT images. Mild coronary calcifications were present. Coronary calcifications were present in the left anterior descending artery distribution(s).   Findings are equivocal. The study is low risk.   Stress perfusion images not intepretable due to low counts, motion and extra cardiac activity - Reviewed by cardiologist Dr. Daniel Bensimhon on 12/05/2023, Normal stress test.  Long term Patch monitor 09/07/2022 - 09/21/2022:  1. Sinus rhythm -  avg HR of 76 bpm. 2. Bundle Branch Block/IVCD was present.  3. Rare PACs and PVCs 4. No significant arrhythmias noted Normal monitor.    Echo 09/21/2022: IMPRESSIONS   1. Left ventricular ejection fraction, by estimation, is 55 to 60%. The  left ventricle has normal function. The left ventricle has no regional  wall motion abnormalities. There is septal-lateral dyssynchrony consistent  with LBBB. There is mild concentric   left ventricular hypertrophy. Left ventricular diastolic parameters are  consistent with Grade I diastolic dysfunction (impaired relaxation).   2. Right ventricular systolic function is normal. The right ventricular  size is normal. Tricuspid regurgitation signal is inadequate for assessing  PA pressure.   3. The mitral  valve is normal in structure. No evidence of mitral valve  regurgitation. No evidence of mitral stenosis.   4. The aortic valve is tricuspid. Aortic valve regurgitation is not  visualized. No aortic stenosis is present.   5. The inferior vena cava is normal in size with greater than 50%  respiratory variability, suggesting right atrial pressure of 3 mmHg.    Past Medical History:  Diagnosis Date   Anemia    Anxiety    Arthritis    DJD, lumbar spondylosis   Asthma    Back pain    Bilateral renal cysts    Breast cancer (HCC)    left breast invasive ductal ca in remission as of 10/07/11 s/p double mastectomy   Breast cancer of upper-outer quadrant of left female breast (HCC) 01/15/2010   Per Dr. Kathern last clinic note 04/2012:  stage II a invasive ductal carcinoma of the left breast diagnosed in October 2011. She was found to have a ER/PR positive HER-2/neu positive breast cancer.She underwent bilateral mastectomies. Her right mastectomy was an elective prophylactic mastectomy due to her on comfort. Patient subsequently underwent adjuvant chemotherapy consisting of Taxotere carbop   Bronchitis 03/10/2012   Cataract    Chemotherapy follow-up examination 03/06/2011   Colon polyps    Cortical age-related cataract, bilateral 04/13/2016  Cough 03/06/2011   Depression    seasonal melancholia   Diabetes mellitus type 2, controlled (HCC)    E. coli UTI    Edema extremities    GERD (gastroesophageal reflux disease)    History of migraine headaches    Hyperlipidemia    Hypertension    Ileitis    Kidney stones    non obstructive    Myopathy    not statin related, cause unknown    Posterior vitreous detachment of right eye 04/13/2016    Past Surgical History:  Procedure Laterality Date   ABDOMINAL HYSTERECTOMY     for fibroids    APPENDECTOMY     BREAST SURGERY  2011   Bilateral mastectomy   CYSTOSCOPY WITH URETHRAL DILATATION N/A 12/26/2019   Procedure: CYSTOSCOPY WITH URETHRAL  DILATATION;  Surgeon: Elisabeth Valli BIRCH, MD;  Location: [facility];  Service: Urology;  Laterality: N/A;  30 MINS   INCONTINENCE SURGERY     LAMINECTOMY     C5-6 Dr. Lucilla 2005    MASTECTOMY     bilateral    OOPHORECTOMY     TONSILLECTOMY      MEDICATIONS: No current facility-administered medications for this encounter.    albuterol  (VENTOLIN  HFA) 108 (90 Base) MCG/ACT inhaler   bisoprolol  (ZEBETA ) 5 MG tablet   cholecalciferol (VITAMIN D ) 1000 UNITS tablet   diclofenac  Sodium (VOLTAREN ) 1 % GEL   ezetimibe (ZETIA) 10 MG tablet   fluticasone  (FLONASE ) 50 MCG/ACT nasal spray   furosemide  (LASIX ) 20 MG tablet   gabapentin  (NEURONTIN ) 100 MG capsule   hydrALAZINE  (APRESOLINE ) 50 MG tablet   hydrocortisone  2.5 % cream   linagliptin  (TRADJENTA ) 5 MG TABS tablet   LORazepam  (ATIVAN ) 1 MG tablet   Multiple Vitamin (MULTIVITAMIN WITH MINERALS) TABS tablet   olmesartan  (BENICAR ) 40 MG tablet   simvastatin  (ZOCOR ) 80 MG tablet   traMADol  (ULTRAM ) 50 MG tablet   glucose blood (ACCU-CHEK GUIDE) test strip    Isaiah Ruder, PA-C Surgical Short Stay/Anesthesiology [facility] Phone [phone] [facility] Phone [phone] 01/06/2024 1:03 PM

## 2024-01-06 NOTE — Progress Notes (Signed)
 SDW CALL  Patient was given pre-op instructions over the phone. The opportunity was given for the patient to ask questions. No further questions asked. Patient verbalized understanding of instructions given.   PCP - Leontine Cramp, NP Cardiologist - Toribio Cherrie COME Pulmonologist - Ozell Darlean COME Nephrologist - Katheryn Jerrye COME  PPM/ICD - denies Device Orders -  Rep Notified -   Chest x-ray - 07/02/23 EKG - 07/06/23 Stress Test - 12/01/23 ECHO - 09/21/22 Cardiac Cath - denies  Sleep Study - 08/25/23-+ OSA CPAP - pt stated she decided to wait to get CPAP.  Fasting Blood Sugar - Pt is not sure. She cannot remember the last time she checked her blood sugar. She agrees to check it before coming to the hospital for surgery.    Do not take oral diabetes medicines (pills) the morning of surgery.Do not take Tradjenta  the morning of surgery.    Check your blood sugar the morning of your surgery when you wake up and every 2 hours until you get to the Short Stay unit.  If your blood sugar is less than 70 mg/dL, you will need to treat for low blood sugar: Do not take insulin. Treat a low blood sugar (less than 70 mg/dL) with  cup of clear juice (cranberry or apple), 4 glucose tablets, OR glucose gel. Recheck blood sugar in 15 minutes after treatment (to make sure it is greater than 70 mg/dL). If your blood sugar is not greater than 70 mg/dL on recheck, call [phone] for further instructions. Report your blood sugar to the short stay nurse when you get to Short Stay.  Blood Thinner Instructions: hold NSAIDS- no diclofenac  (Voltaren )gel. Aspirin Instructions:  ERAS Protcol -clears until 1015 PRE-SURGERY Ensure or G2- no  COVID TEST- na   Anesthesia review: yes-Hx-CHF,asthma,HTN,DM,OSA  Patient denies shortness of breath, fever, cough and chest pain over the phone call   Special instructions:    Oral Hygiene is also important to reduce your risk of infection.  Remember - BRUSH YOUR  TEETH THE MORNING OF SURGERY WITH YOUR REGULAR TOOTHPASTE

## 2024-01-07 ENCOUNTER — Encounter (HOSPITAL_COMMUNITY): Payer: Self-pay | Admitting: Otolaryngology

## 2024-01-07 ENCOUNTER — Ambulatory Visit (HOSPITAL_COMMUNITY)
Admission: RE | Admit: 2024-01-07 | Discharge: 2024-01-07 | Disposition: A | Discharge status: Home / Self Care | Attending: Otolaryngology | Admitting: Otolaryngology

## 2024-01-07 ENCOUNTER — Other Ambulatory Visit: Payer: Self-pay

## 2024-01-07 ENCOUNTER — Ambulatory Visit (HOSPITAL_COMMUNITY): Payer: Self-pay | Admitting: Vascular Surgery

## 2024-01-07 ENCOUNTER — Encounter (HOSPITAL_COMMUNITY)
Admission: RE | Disposition: A | Discharge status: Home / Self Care | Payer: Self-pay | Source: Home / Self Care | Attending: Otolaryngology

## 2024-01-07 DIAGNOSIS — N183 Chronic kidney disease, stage 3 unspecified: Secondary | ICD-10-CM | POA: Diagnosis not present

## 2024-01-07 DIAGNOSIS — J338 Other polyp of sinus: Secondary | ICD-10-CM

## 2024-01-07 DIAGNOSIS — Z9013 Acquired absence of bilateral breasts and nipples: Secondary | ICD-10-CM | POA: Insufficient documentation

## 2024-01-07 DIAGNOSIS — I447 Left bundle-branch block, unspecified: Secondary | ICD-10-CM | POA: Diagnosis not present

## 2024-01-07 DIAGNOSIS — I130 Hypertensive heart and chronic kidney disease with heart failure and stage 1 through stage 4 chronic kidney disease, or unspecified chronic kidney disease: Secondary | ICD-10-CM

## 2024-01-07 DIAGNOSIS — J320 Chronic maxillary sinusitis: Secondary | ICD-10-CM

## 2024-01-07 DIAGNOSIS — J342 Deviated nasal septum: Secondary | ICD-10-CM | POA: Diagnosis present

## 2024-01-07 DIAGNOSIS — F419 Anxiety disorder, unspecified: Secondary | ICD-10-CM | POA: Insufficient documentation

## 2024-01-07 DIAGNOSIS — J339 Nasal polyp, unspecified: Secondary | ICD-10-CM | POA: Insufficient documentation

## 2024-01-07 DIAGNOSIS — G4730 Sleep apnea, unspecified: Secondary | ICD-10-CM | POA: Insufficient documentation

## 2024-01-07 DIAGNOSIS — J3489 Other specified disorders of nose and nasal sinuses: Secondary | ICD-10-CM | POA: Insufficient documentation

## 2024-01-07 DIAGNOSIS — Z7984 Long term (current) use of oral hypoglycemic drugs: Secondary | ICD-10-CM | POA: Diagnosis not present

## 2024-01-07 DIAGNOSIS — Z981 Arthrodesis status: Secondary | ICD-10-CM | POA: Insufficient documentation

## 2024-01-07 DIAGNOSIS — Z853 Personal history of malignant neoplasm of breast: Secondary | ICD-10-CM | POA: Insufficient documentation

## 2024-01-07 DIAGNOSIS — D7219 Other eosinophilia: Secondary | ICD-10-CM | POA: Insufficient documentation

## 2024-01-07 DIAGNOSIS — J343 Hypertrophy of nasal turbinates: Secondary | ICD-10-CM | POA: Insufficient documentation

## 2024-01-07 DIAGNOSIS — E785 Hyperlipidemia, unspecified: Secondary | ICD-10-CM | POA: Diagnosis not present

## 2024-01-07 DIAGNOSIS — J324 Chronic pansinusitis: Secondary | ICD-10-CM | POA: Insufficient documentation

## 2024-01-07 DIAGNOSIS — M1990 Unspecified osteoarthritis, unspecified site: Secondary | ICD-10-CM | POA: Diagnosis not present

## 2024-01-07 DIAGNOSIS — I5032 Chronic diastolic (congestive) heart failure: Secondary | ICD-10-CM | POA: Diagnosis not present

## 2024-01-07 DIAGNOSIS — J45909 Unspecified asthma, uncomplicated: Secondary | ICD-10-CM | POA: Diagnosis not present

## 2024-01-07 DIAGNOSIS — E1122 Type 2 diabetes mellitus with diabetic chronic kidney disease: Secondary | ICD-10-CM | POA: Diagnosis not present

## 2024-01-07 DIAGNOSIS — J323 Chronic sphenoidal sinusitis: Secondary | ICD-10-CM

## 2024-01-07 DIAGNOSIS — J321 Chronic frontal sinusitis: Secondary | ICD-10-CM

## 2024-01-07 DIAGNOSIS — J322 Chronic ethmoidal sinusitis: Secondary | ICD-10-CM

## 2024-01-07 HISTORY — DX: Left bundle-branch block, unspecified: I44.7

## 2024-01-07 HISTORY — PX: SINUS ENDO WITH FUSION: SHX5329

## 2024-01-07 HISTORY — DX: Sleep apnea, unspecified: G47.30

## 2024-01-07 LAB — CBC
HCT: 35.2 % — ABNORMAL LOW (ref 36.0–46.0)
Hemoglobin: 11.4 g/dL — ABNORMAL LOW (ref 12.0–15.0)
MCH: 28.1 pg (ref 26.0–34.0)
MCHC: 32.4 g/dL (ref 30.0–36.0)
MCV: 86.9 fL (ref 80.0–100.0)
Platelets: 226 K/uL (ref 150–400)
RBC: 4.05 MIL/uL (ref 3.87–5.11)
RDW: 13 % (ref 11.5–15.5)
WBC: 7.4 K/uL (ref 4.0–10.5)
nRBC: 0 % (ref 0.0–0.2)

## 2024-01-07 LAB — GLUCOSE, CAPILLARY
Glucose-Capillary: 158 mg/dL — ABNORMAL HIGH (ref 70–99)
Glucose-Capillary: 131 mg/dL — ABNORMAL HIGH (ref 70–99)
Glucose-Capillary: 164 mg/dL — ABNORMAL HIGH (ref 70–99)

## 2024-01-07 LAB — BASIC METABOLIC PANEL WITH GFR
Anion gap: 14 (ref 5–15)
BUN: 19 mg/dL (ref 8–23)
CO2: 21 mmol/L — ABNORMAL LOW (ref 22–32)
Calcium: 9.2 mg/dL (ref 8.9–10.3)
Chloride: 107 mmol/L (ref 98–111)
Creatinine, Ser: 1.8 mg/dL — ABNORMAL HIGH (ref 0.44–1.00)
GFR, Estimated: 29 mL/min — ABNORMAL LOW (ref >60)
Glucose, Bld: 166 mg/dL — ABNORMAL HIGH (ref 70–99)
Potassium: 4.1 mmol/L (ref 3.5–5.1)
Sodium: 142 mmol/L (ref 135–145)

## 2024-01-07 SURGERY — SURGERY, PARANASAL SINUS, ENDOSCOPIC, WITH NASAL SEPTOPLASTY, TURBINOPLASTY, AND MAXILLARY SINUSOTOMY
Anesthesia: General | Laterality: Bilateral

## 2024-01-07 MED ORDER — MIDAZOLAM HCL 2 MG/2ML IJ SOLN
INTRAMUSCULAR | Status: AC
  Filled 2024-01-07: qty 2

## 2024-01-07 MED ORDER — SODIUM CHLORIDE 0.9 % IR SOLN
Status: DC | PRN
  Administered 2024-01-07: 1000 mL

## 2024-01-07 MED ORDER — ACETAMINOPHEN 10 MG/ML IV SOLN
INTRAVENOUS | Status: AC
  Filled 2024-01-07: qty 100

## 2024-01-07 MED ORDER — OXYMETAZOLINE HCL 0.05 % NA SOLN
NASAL | Status: DC | PRN
  Administered 2024-01-07: 1 via TOPICAL

## 2024-01-07 MED ORDER — ORAL CARE MOUTH RINSE: 15.0000 mL | Freq: Once | OROMUCOSAL | Status: AC

## 2024-01-07 MED ORDER — LACTATED RINGERS IV SOLN: INTRAVENOUS | Status: DC

## 2024-01-07 MED ORDER — HYDROMORPHONE HCL 1 MG/ML IJ SOLN
INTRAMUSCULAR | Status: DC | PRN
  Administered 2024-01-07: .5 mg via INTRAVENOUS

## 2024-01-07 MED ORDER — LIDOCAINE-EPINEPHRINE 1 %-1:100000 IJ SOLN
INTRAMUSCULAR | Status: AC
  Filled 2024-01-07: qty 1

## 2024-01-07 MED ORDER — ALBUMIN HUMAN 5 % IV SOLN: INTRAVENOUS | Status: DC | PRN

## 2024-01-07 MED ORDER — OXYMETAZOLINE HCL 0.05 % NA SOLN
NASAL | Status: AC
  Filled 2024-01-07: qty 30

## 2024-01-07 MED ORDER — CHLORHEXIDINE GLUCONATE 0.12 % MT SOLN
15.0000 mL | Freq: Once | OROMUCOSAL | Status: AC
  Administered 2024-01-07: 15 mL via OROMUCOSAL
  Filled 2024-01-07: qty 15

## 2024-01-07 MED ORDER — BACITRACIN ZINC 500 UNIT/GM EX OINT
TOPICAL_OINTMENT | CUTANEOUS | Status: AC
  Filled 2024-01-07: qty 28.35

## 2024-01-07 MED ORDER — OXYCODONE HCL 5 MG PO TABS
5.0000 mg | ORAL_TABLET | Freq: Once | ORAL | Status: AC | PRN
  Administered 2024-01-07: 5 mg via ORAL

## 2024-01-07 MED ORDER — FENTANYL CITRATE (PF) 250 MCG/5ML IJ SOLN
INTRAMUSCULAR | Status: DC | PRN
  Administered 2024-01-07: 100 ug via INTRAVENOUS

## 2024-01-07 MED ORDER — FENTANYL CITRATE (PF) 100 MCG/2ML IJ SOLN
INTRAMUSCULAR | Status: AC
  Filled 2024-01-07: qty 2

## 2024-01-07 MED ORDER — LIDOCAINE 2% (20 MG/ML) 5 ML SYRINGE
INTRAMUSCULAR | Status: DC | PRN
  Administered 2024-01-07: 20 mg via INTRAVENOUS

## 2024-01-07 MED ORDER — OXYCODONE HCL 5 MG PO TABS
ORAL_TABLET | ORAL | Status: AC
  Filled 2024-01-07: qty 1

## 2024-01-07 MED ORDER — DEXMEDETOMIDINE HCL IN NACL 80 MCG/20ML IV SOLN
INTRAVENOUS | Status: DC | PRN
  Administered 2024-01-07 (×2): 4 ug via INTRAVENOUS

## 2024-01-07 MED ORDER — SUGAMMADEX SODIUM 200 MG/2ML IV SOLN
INTRAVENOUS | Status: DC | PRN
  Administered 2024-01-07: 200 mg via INTRAVENOUS

## 2024-01-07 MED ORDER — PHENYLEPHRINE HCL-NACL 20-0.9 MG/250ML-% IV SOLN
INTRAVENOUS | Status: DC | PRN
  Administered 2024-01-07: 80 ug via INTRAVENOUS
  Administered 2024-01-07: 30 ug/min via INTRAVENOUS

## 2024-01-07 MED ORDER — LIDOCAINE-EPINEPHRINE 1 %-1:100000 IJ SOLN
INTRAMUSCULAR | Status: DC | PRN
  Administered 2024-01-07: 3 mL

## 2024-01-07 MED ORDER — INSULIN ASPART 100 UNIT/ML IJ SOLN: 0.0000 [IU] | INTRAMUSCULAR | Status: DC | PRN

## 2024-01-07 MED ORDER — PROPOFOL 10 MG/ML IV BOLUS
INTRAVENOUS | Status: AC
  Filled 2024-01-07: qty 20

## 2024-01-07 MED ORDER — HYDROMORPHONE HCL 1 MG/ML IJ SOLN
INTRAMUSCULAR | Status: AC
  Filled 2024-01-07: qty 0.5

## 2024-01-07 MED ORDER — FENTANYL CITRATE (PF) 100 MCG/2ML IJ SOLN
25.0000 ug | INTRAMUSCULAR | Status: DC | PRN
  Administered 2024-01-07 (×2): 25 ug via INTRAVENOUS

## 2024-01-07 MED ORDER — BACITRACIN ZINC 500 UNIT/GM EX OINT
TOPICAL_OINTMENT | CUTANEOUS | Status: DC | PRN
  Administered 2024-01-07: 1 via TOPICAL

## 2024-01-07 MED ORDER — MIDAZOLAM HCL (PF) 2 MG/2ML IJ SOLN
INTRAMUSCULAR | Status: DC | PRN
  Administered 2024-01-07: 1 mg via INTRAVENOUS

## 2024-01-07 MED ORDER — ONDANSETRON HCL 4 MG/2ML IJ SOLN
INTRAMUSCULAR | Status: DC | PRN
  Administered 2024-01-07: 4 mg via INTRAVENOUS

## 2024-01-07 MED ORDER — OXYCODONE HCL 5 MG/5ML PO SOLN: 5.0000 mg | Freq: Once | ORAL | Status: AC | PRN

## 2024-01-07 MED ORDER — CEFAZOLIN SODIUM-DEXTROSE 2-3 GM-%(50ML) IV SOLR
INTRAVENOUS | Status: DC | PRN
  Administered 2024-01-07: 2 g via INTRAVENOUS

## 2024-01-07 MED ADMIN — Rocuronium Bromide IV Soln Pref Syr 100 MG/10ML (10 MG/ML): 60 mg | INTRAVENOUS | NDC 99999070048

## 2024-01-07 MED ADMIN — Dexamethasone Sod Phosphate Preservative Free Inj 10 MG/ML: 5 mg | INTRAVENOUS | NDC 25021005301

## 2024-01-07 MED ADMIN — PROPOFOL 200 MG/20ML IV EMUL: 150 mg | INTRAVENOUS | NDC 00069020910

## 2024-01-07 MED ADMIN — Glycopyrrolate Inj 0.2 MG/ML: .2 mg | INTRAVENOUS | NDC 00143968225

## 2024-01-07 MED ADMIN — Acetaminophen IV Soln 10 MG/ML: 1000 mg | INTRAVENOUS | NDC 63323043400

## 2024-01-07 SURGICAL SUPPLY — 42 items
BLADE RAD40 ROTATE 4M 4 5PK (BLADE) IMPLANT
BLADE RAD60 ROTATE M4 4 5PK (BLADE) IMPLANT
BLADE ROTATE TRICUT 4X13 M4 (BLADE) ×1 IMPLANT
BLADE SURG 15 STRL LF DISP TIS (BLADE) IMPLANT
BLADE TRICUT ROTATE M4 4 5PK (BLADE) IMPLANT
CANISTER SUCTION 3000ML PPV (SUCTIONS) ×1 IMPLANT
CNTNR URN SCR LID CUP LEK RST (MISCELLANEOUS) ×1 IMPLANT
COAGULATOR SUCT SWTCH 10FR 6 (ELECTROSURGICAL) IMPLANT
DRAPE SHEET LG 3/4 BI-LAMINATE (DRAPES) IMPLANT
DRSG NASOPORE 8CM (GAUZE/BANDAGES/DRESSINGS) IMPLANT
ELECTRODE REM PT RTRN 9FT ADLT (ELECTROSURGICAL) IMPLANT
GAUZE SPONGE 2X2 8PLY STRL LF (GAUZE/BANDAGES/DRESSINGS) ×1 IMPLANT
GLOVE ECLIPSE 7.5 STRL STRAW (GLOVE) ×1 IMPLANT
GOWN STRL REUS W/ TWL LRG LVL3 (GOWN DISPOSABLE) ×2 IMPLANT
KIT BASIN OR (CUSTOM PROCEDURE TRAY) ×1 IMPLANT
KIT TURNOVER KIT B (KITS) ×1 IMPLANT
NDL HYPO 25GX1X1/2 BEV (NEEDLE) IMPLANT
NDL SPNL 25GX3.5 QUINCKE BL (NEEDLE) ×1 IMPLANT
NEEDLE HYPO 25GX1X1/2 BEV (NEEDLE) IMPLANT
NEEDLE SPNL 25GX3.5 QUINCKE BL (NEEDLE) ×1 IMPLANT
PAD ARMBOARD POSITIONER FOAM (MISCELLANEOUS) ×2 IMPLANT
PENCIL SMOKE EVACUATOR (MISCELLANEOUS) ×1 IMPLANT
POINTER NAVIGATION AXIEM (INSTRUMENTS) IMPLANT
POSITIONER HEAD DONUT 9IN (MISCELLANEOUS) ×1 IMPLANT
SET IV EXT TUBING 30 (IV SETS) ×1 IMPLANT
SOLN 0.9% NACL 1000 ML (IV SOLUTION) ×1 IMPLANT
SOLN 0.9% NACL POUR BTL 1000ML (IV SOLUTION) ×1 IMPLANT
SOLN STERILE WATER 1000 ML (IV SOLUTION) ×1 IMPLANT
SOLN STERILE WATER BTL 1000 ML (IV SOLUTION) ×1 IMPLANT
SPLINT NASAL DOYLE BI-VL (GAUZE/BANDAGES/DRESSINGS) ×1 IMPLANT
SPONGE NEURO XRAY DETECT 1X3 (DISPOSABLE) ×1 IMPLANT
SURGIFLO W/THROMBIN 8M KIT (HEMOSTASIS) IMPLANT
SUT CHROMIC 3 0 SH 27 (SUTURE) IMPLANT
SUT CHROMIC 4 0 P 3 18 (SUTURE) IMPLANT
SUT PROLENE 2 0 FS (SUTURE) IMPLANT
TOWEL GREEN STERILE FF (TOWEL DISPOSABLE) ×1 IMPLANT
TRACKER ENT INSTRUMENT (MISCELLANEOUS) ×1 IMPLANT
TRACKER ENT PATIENT (MISCELLANEOUS) ×1 IMPLANT
TRAY ENT MC OR (CUSTOM PROCEDURE TRAY) ×1 IMPLANT
TUBE CONNECTING 12X1/4 (SUCTIONS) ×1 IMPLANT
TUBE SALEM SUMP 16F (TUBING) ×1 IMPLANT
TUBING STRAIGHTSHOT EPS 5PK (TUBING) ×1 IMPLANT

## 2024-01-07 NOTE — Discharge Instructions (Signed)

## 2024-01-07 NOTE — Op Note (Signed)
 DATE OF PROCEDURE: 01/07/2024  OPERATIVE REPORT   SURGEON: Daniel Moccasin, MD   PREOPERATIVE DIAGNOSES:  1.  Nasal septal deviation.  2.  Bilateral inferior turbinate hypertrophy.  3.  Bilateral concha bullosa. 4.  Chronic right pansinusitis with polyposis 5.  Chronic left maxillary sinusitis 6.  Chronic nasal obstruction.  POSTOPERATIVE DIAGNOSES:  1.  Nasal septal deviation.  2.  Bilateral inferior turbinate hypertrophy.  3.  Bilateral concha bullosa. 4.  Chronic right pansinusitis with polyposis 5.  Chronic left maxillary sinusitis 6.  Chronic nasal obstruction.  PROCEDURE PERFORMED:  1.  Right endoscopic total ethmoidectomy and sphenoidotomy with polyp removal 2.  Right endoscopic frontal sinusotomy with polyp removal 3.  Bilateral endoscopic maxillary antrostomy with polyp removal 4.  Septoplasty.  5.  Bilateral partial inferior turbinate resection.  6.  Bilateral endoscopic concha bullosa resection. 7.  FUSION stereotactic image guidance  ANESTHESIA: General endotracheal tube anesthesia.   COMPLICATIONS: None.   ESTIMATED BLOOD LOSS: 250 mL.   INDICATION FOR PROCEDURE: [name] is a 73 y.o. female with a history of chronic nasal obstruction and chronic rhinosinusitis, with frequent recurrent exacerbations. She was treated with multiple courses of antibiotics and steroids. At her last visit, she was also noted to have nasal septal deviation and bilateral inferior turbinate hypertrophy. She was treated with Flonase  nasal spray, nasal saline irrigation, and allergy medications. Her most recent CT scan shows chronic rhinosinusitis, involving her right frontal, right ethmoid, right sphenoid, and bilateral maxillary sinuses. In addition, she also has nasal septal deviation, bilateral inferior turbinate hypertrophy, and bilateral concha bullosa. Based on the above findings, the decision was made for the patient to undergo the above-stated procedures. The risks, benefits,  alternatives, and details of the procedures were discussed with the patient. Questions were invited and answered. Informed consent was obtained.   DESCRIPTION OF PROCEDURE: The patient was taken to the operating room and placed supine on the operating table. General endotracheal tube anesthesia was administered by the anesthesiologist. The patient was positioned, and prepped and draped in the standard fashion for nasal surgery. Pledgets soaked with Afrin were placed in both nasal cavities for decongestion. The pledgets were subsequently removed. The FUSION stereotactic image guidance marker was placed. The image guidance system was functional throughout the case.  Examination of the nasal cavity revealed a severe nasal septal deviation. 1% lidocaine  with 1:100,000 epinephrine was injected onto the nasal septum bilaterally. A hemitransfixion incision was made on the left side. The mucosal flap was carefully elevated on the left side. A cartilaginous incision was made 1 cm superior to the caudal margin of the nasal septum. Mucosal flap was also elevated on the right side in the similar fashion. It should be noted that due to the severe septal deviation, the deviated portion of the cartilaginous and bony septum had to be removed in piecemeal fashion. Once the deviated portions were removed, a straight midline septum was achieved. The septum was then quilted with 4-0 plain gut sutures. The hemitransfixion incision was closed with interrupted 4-0 chromic sutures.   The inferior one half of both hypertrophied inferior turbinate was crossclamped with a Kelly clamp. The inferior one half of each inferior turbinate was then resected with a pair of cross cutting scissors. Hemostasis was achieved with a suction cautery device.   Using a 0 endoscope, the right nasal cavity was examined. A large concha bullosa was noted. Using Tru-Cut forceps, the inferior one third and medial one half of the concha bullosa was  resected. Polypoid tissue was noted within the middle meatus. The polypoid tissue was removed using a combination of microdebrider and Blakesley forceps. The uncinate process was resected with a freer elevator. The maxillary antrum was entered and enlarged using a combination of backbiter and microdebrider. Polypoid tissue was removed from the right maxillary sinus.  Attention was then focused on the ethmoid sinuses. The bony partitions of the anterior and posterior ethmoid cavities were taken down. Polypoid tissue was noted and removed.  More polyps were noted to obstruct the left sphenoid opening. The polyps were removed. The sphenoid opening was entered and enlarged.  More polypoid tissue was removed from within the sphenoid sinus. Attention was then focused on the frontal sinus. The frontal recess was identified and enlarged by removing the surrounding bony partitions. Polypoid tissue was removed from the frontal recess. All 4 paranasal sinuses were copiously irrigated with saline solution.  The same concha bullosa and maxillary antrostomy procedures were repeated on the left side without exception. More polyps were removed from the left middle meatus and left maxillary sinus.  Doyle splints were applied to the nasal septum.  The care of the patient was turned over to the anesthesiologist. The patient was awakened from anesthesia without difficulty. The patient was extubated and transferred to the recovery room in good condition.   OPERATIVE FINDINGS: Nasal septal deviation, bilateral inferior turbinate hypertrophy, bilateral concha bullosa, chronic rhinosinusitis and polyposis, involving the right frontal, right ethmoid, right sphenoid, and bilateral maxillary sinuses.  SPECIMEN: Bilateral sinus contents.   FOLLOWUP CARE: The patient be discharged home once she is awake and alert. The patient will follow up in my office in 3 days for splint removal.   Nasim Habeeb Dois Moccasin, MD

## 2024-01-07 NOTE — Anesthesia Procedure Notes (Addendum)
 Procedure Name: Intubation Date/Time: 01/07/2024 1:48 PM  Performed by: Jerl Donald LABOR, CRNAPre-anesthesia Checklist: Patient identified, Emergency Drugs available, Suction available and Patient being monitored Patient Re-evaluated:Patient Re-evaluated prior to induction Oxygen Delivery Method: Circle System Utilized Preoxygenation: Pre-oxygenation with 100% oxygen Induction Type: IV induction Ventilation: Mask ventilation without difficulty Laryngoscope Size: 3 (glide-go) Grade View: Grade I Tube type: Oral Number of attempts: 1 Airway Equipment and Method: Stylet and Video-laryngoscopy Placement Confirmation: ETT inserted through vocal cords under direct vision, positive ETCO2 and breath sounds checked- equal and bilateral Secured at: 22 cm Tube secured with: Tape Dental Injury: Teeth and Oropharynx as per pre-operative assessment

## 2024-01-07 NOTE — H&P (Signed)
 Cc: Chronic rhinosinusitis, chronic nasal obstruction   HPI: The patient is a 73 year old female who returns today for follow-up evaluation.  The patient has a history of chronic rhinosinusitis, with frequent recurrent exacerbations.  She was treated with multiple courses of antibiotics and steroids.  At her last visit, she was also noted to have nasal septal deviation and bilateral inferior turbinate hypertrophy.  She was treated with Flonase  nasal spray, nasal saline irrigation, and allergy medications.  Her most recent CT scan shows chronic rhinosinusitis, involving her right frontal, right ethmoid, right sphenoid, and bilateral maxillary sinuses.  In addition, she also has nasal septal deviation, bilateral inferior turbinate hypertrophy, and bilateral concha bullosa.  The patient returns today complaining of persistent nasal obstruction.  She also has frequent facial pressure and discomfort.  She is interested in more definitive treatment.   Exam: General: Communicates without difficulty, well nourished, no acute distress. Head: Normocephalic, no evidence injury, no tenderness, facial buttresses intact without stepoff. Face/sinus: No tenderness to palpation and percussion. Facial movement is normal and symmetric. Eyes: PERRL, EOMI. No scleral icterus, conjunctivae clear. Neuro: CN II exam reveals vision grossly intact.  No nystagmus at any point of gaze. Ears: Auricles well formed without lesions.  Ear canals are intact without mass or lesion.  No erythema or edema is appreciated.  The TMs are intact without fluid. Nose: External evaluation reveals normal support and skin without lesions.  Dorsum is intact.  Anterior rhinoscopy reveals congested mucosa over anterior aspect of inferior turbinates and deviated septum.  No purulence noted. Oral:  Oral cavity and oropharynx are intact, symmetric, without erythema or edema.  Mucosa is moist without lesions. Neck: Full range of motion without pain.  There is no  significant lymphadenopathy.  No masses palpable.  Thyroid  bed within normal limits to palpation.  Parotid glands and submandibular glands equal bilaterally without mass.  Trachea is midline. Neuro:  CN 2-12 grossly intact.    Assessment: 1.  Chronic rhinosinusitis, involving the right frontal, right ethmoid, right sphenoid, and bilateral maxillary sinuses. 2.  Chronic nasal obstruction, secondary to nasal septal deviation, bilateral inferior turbinate hypertrophy, and bilateral concha bullosa. 3.  The patient has not responded to extensive medical treatment over the past year.   Plan: 1.  The physical exam findings and the CT images are extensively reviewed with the patient. 2.  Continue with Flonase  nasal spray and nasal saline irrigation daily. 3.  Based on the above findings, the patient will benefit from surgical intervention with septoplasty, bilateral inferior turbinate reduction, bilateral concha bullosa resection, and endoscopic sinus surgery to treat the involved sinuses. 4.  The risk, benefits, and details of the procedures are extensively discussed.  Questions are invited and answered. 5.  The patient would like to proceed with the procedures.

## 2024-01-07 NOTE — Transfer of Care (Signed)
 Immediate Anesthesia Transfer of Care Note  Patient: [name]  Procedure(s) Performed: SURGERY, PARANASAL SINUS, ENDOSCOPIC, WITH NASAL SEPTOPLASTY, TURBINOPLASTY, AND MAXILLARY SINUSOTOMY (Bilateral)  Patient Location: PACU  Anesthesia Type:General  Level of Consciousness: awake and drowsy  Airway & Oxygen Therapy: Patient Spontanous Breathing  Post-op Assessment: Report given to RN, Post -op Vital signs reviewed and stable, and Patient moving all extremities  Post vital signs: Reviewed and stable  Last Vitals:  Vitals Value Taken Time  BP 170/71 01/07/24 16:00  Temp 36.6 C 01/07/24 15:57  Pulse 70 01/07/24 16:04  Resp 11 01/07/24 16:04  SpO2 94 % 01/07/24 16:04  Vitals shown include unfiled device data.  Last Pain:  Vitals:   01/07/24 1600  TempSrc:   PainSc: Asleep         Complications: No notable events documented.

## 2024-01-08 NOTE — Anesthesia Postprocedure Evaluation (Signed)
 Anesthesia Post Note  Patient: [name]  Procedure(s) Performed: SURGERY, PARANASAL SINUS, ENDOSCOPIC, WITH NASAL SEPTOPLASTY, TURBINOPLASTY, AND MAXILLARY SINUSOTOMY (Bilateral)     Patient location during evaluation: PACU Anesthesia Type: General Level of consciousness: awake and alert Pain management: pain level controlled Vital Signs Assessment: post-procedure vital signs reviewed and stable Respiratory status: spontaneous breathing, nonlabored ventilation, respiratory function stable and patient connected to nasal cannula oxygen Cardiovascular status: blood pressure returned to baseline and stable Postop Assessment: no apparent nausea or vomiting Anesthetic complications: no   No notable events documented.  Last Vitals:  Vitals:   01/07/24 1745 01/07/24 1800  BP: (!) 158/73 (!) 171/79  Pulse: 62 71  Resp: 13 14  Temp:    SpO2: 94% 93%    Last Pain:  Vitals:   01/07/24 1754  TempSrc:   PainSc: 3                  Jamilyn Pigeon L Mariachristina Holle

## 2024-01-09 ENCOUNTER — Encounter (HOSPITAL_COMMUNITY): Payer: Self-pay | Admitting: Otolaryngology

## 2024-01-10 ENCOUNTER — Ambulatory Visit (INDEPENDENT_AMBULATORY_CARE_PROVIDER_SITE_OTHER): Admitting: Otolaryngology

## 2024-01-10 VITALS — BP 149/83 | HR 75 | Temp 97.8°F

## 2024-01-10 DIAGNOSIS — J338 Other polyp of sinus: Secondary | ICD-10-CM | POA: Diagnosis not present

## 2024-01-10 DIAGNOSIS — J324 Chronic pansinusitis: Secondary | ICD-10-CM | POA: Diagnosis not present

## 2024-01-10 NOTE — Progress Notes (Signed)
 Patient ID: [name], female   DOB: 1950/10/23, 73 y.o.   MRN: [id number]  Procedure: Nasal/sinus debridement status post endoscopic sinus surgery.  Indication: The patient previously underwent bilateral endoscopic sinus surgery on 01/07/2024 to treat her chronic pansinusitis and polyposis.  The patient returns today complaining of significant nasal congestion and crusting.  She had a moderate amount of bleeding from the nasal cavities.  Currently the patient denies any facial pain, fever, or visual change.  Anesthesia: Topical Xylocaine  and Neo-Synephrine.  Description: The patient is placed upright in the exam chair. Both nasal cavities are sprayed with topical Xylocaine  and Neo-Synephrine.  A 0 rigid endoscope is used for the examination. The scope is first advanced past the right nostril into the right nasal cavity. A significant amount of crusting and blood clots are noted within the right nasal cavity and the maxillary/ethmoid cavities.  The crusting and blood clots are removed with suction catheters and alligator forceps, which are inserted in parallel with the rigid endoscope.  After the debridement procedure, the maxillary antrum and the ethmoid cavities are noted to be patent.  The sphenoid opening and frontal recess are also patent.   The same procedure is then repeated on the left side without exception. Similar findings are again noted on the left. The patient tolerated the procedure well.  Follow up care: The patient is instructed to perform daily nasal saline irrigation.  The patient will return for re-evaluation in approximately 2 weeks.

## 2024-01-11 LAB — SURGICAL PATHOLOGY

## 2024-01-17 ENCOUNTER — Telehealth (INDEPENDENT_AMBULATORY_CARE_PROVIDER_SITE_OTHER): Payer: Self-pay | Admitting: Otolaryngology

## 2024-01-17 NOTE — Telephone Encounter (Signed)
 Per Dr.Teoh , I called in a Prednisone  10 mg Dose pack for 6 days. ( [facility] in [place] [phone]).

## 2024-01-17 NOTE — Telephone Encounter (Signed)
 The patient called in to let Dr Karis know that the tylenol  and tramadol  is not helping her pain/issues.  She reports that even her teeth hurt, she is wondering if Dr Karis would send in some prednisone  as that seemed to help her with similar symptoms in the past.  Please advise.

## 2024-01-24 ENCOUNTER — Encounter (INDEPENDENT_AMBULATORY_CARE_PROVIDER_SITE_OTHER): Payer: Self-pay | Admitting: Otolaryngology

## 2024-01-24 ENCOUNTER — Ambulatory Visit (INDEPENDENT_AMBULATORY_CARE_PROVIDER_SITE_OTHER): Admitting: Otolaryngology

## 2024-01-24 VITALS — BP 119/72 | HR 75 | Temp 98.2°F

## 2024-01-24 DIAGNOSIS — J324 Chronic pansinusitis: Secondary | ICD-10-CM

## 2024-01-24 DIAGNOSIS — J338 Other polyp of sinus: Secondary | ICD-10-CM | POA: Diagnosis not present

## 2024-01-24 NOTE — Progress Notes (Signed)
 Patient ID: [name], female   DOB: 10/10/1950, 73 y.o.   MRN: [id number]  Procedure: Bilateral nasal/sinus debridement status post endoscopic sinus surgery.  Indication: The patient previously underwent bilateral endoscopic sinus surgery on 01/07/2024 to treat her chronic pansinusitis and polyposis.  The patient returns today reporting improvement in her nasal congestion and crusting.  She had no significant bleeding from the nasal cavities.  Currently the patient denies any facial pain, fever, or visual change.  Anesthesia: Topical Xylocaine  and Neo-Synephrine.  Description: The patient is placed upright in the exam chair. Both nasal cavities are sprayed with topical Xylocaine  and Neo-Synephrine.  A 0 rigid endoscope is used for the examination. The scope is first advanced past the right nostril into the right nasal cavity. A moderate amount of crusting is noted within the right nasal cavity and the maxillary/ethmoid cavities.  The crusting is removed with suction catheters and alligator forceps, which are inserted in parallel with the rigid endoscope.  After the debridement procedure, the maxillary antrum and the ethmoid cavities are noted to be patent.  The sphenoid opening and frontal recess are also patent.   The same procedure is then repeated on the left side, focusing on the left maxillary sinus. The patient tolerated the procedure well.  Follow up care: The patient is instructed to continue daily nasal saline irrigation.  The patient will return for re-evaluation in approximately 3 weeks.

## 2024-02-14 ENCOUNTER — Ambulatory Visit (INDEPENDENT_AMBULATORY_CARE_PROVIDER_SITE_OTHER): Admitting: Otolaryngology

## 2024-02-14 ENCOUNTER — Encounter (INDEPENDENT_AMBULATORY_CARE_PROVIDER_SITE_OTHER): Payer: Self-pay | Admitting: Otolaryngology

## 2024-02-14 VITALS — BP 126/76 | HR 73 | Temp 98.1°F | Ht 68.0 in | Wt 232.0 lb

## 2024-02-14 DIAGNOSIS — J338 Other polyp of sinus: Secondary | ICD-10-CM

## 2024-02-14 DIAGNOSIS — J324 Chronic pansinusitis: Secondary | ICD-10-CM

## 2024-02-14 NOTE — Progress Notes (Signed)
 Patient ID: [name], female   DOB: [birth year], 73 y.o.   MRN: [id number]  Procedure: Bilateral nasal/sinus debridement status post endoscopic sinus surgery.  Indication: The patient previously underwent bilateral endoscopic sinus surgery on 01/07/2024 to treat her chronic pansinusitis and polyposis.  The patient returns today reporting continuing improvement in her nasal congestion and crusting.  She had no significant bleeding from the nasal cavities.  Currently the patient denies any facial pain, fever, or visual change.  Anesthesia: Topical Xylocaine  and Neo-Synephrine.  Description: The patient is placed upright in the exam chair. Both nasal cavities are sprayed with topical Xylocaine  and Neo-Synephrine.  A 0 rigid endoscope is used for the examination. The scope is first advanced past the right nostril into the right nasal cavity. A small amount of crusting is noted within the right nasal cavity and the maxillary/ethmoid cavities.  The crusting is removed with suction catheters and alligator forceps, which are inserted in parallel with the rigid endoscope.  After the debridement procedure, the maxillary antrum and the ethmoid cavities are noted to be patent.  The sphenoid opening and frontal recess are also patent.   The same procedure is then repeated on the left side, focusing on the left maxillary sinus. The patient tolerated the procedure well.  Follow up care: The patient is instructed to continue as needed nasal saline irrigation. Restart Flonase  daily.  The patient will return for re-evaluation in approximately 6 months.

## 2024-03-30 ENCOUNTER — Observation Stay (HOSPITAL_BASED_OUTPATIENT_CLINIC_OR_DEPARTMENT_OTHER)
Admission: EM | Admit: 2024-03-30 | Discharge: 2024-03-31 | Disposition: A | Discharge status: Home / Self Care | Attending: Family Medicine | Admitting: Family Medicine

## 2024-03-30 ENCOUNTER — Emergency Department (HOSPITAL_BASED_OUTPATIENT_CLINIC_OR_DEPARTMENT_OTHER)

## 2024-03-30 ENCOUNTER — Other Ambulatory Visit: Payer: Self-pay

## 2024-03-30 ENCOUNTER — Observation Stay (HOSPITAL_COMMUNITY)

## 2024-03-30 ENCOUNTER — Encounter (HOSPITAL_BASED_OUTPATIENT_CLINIC_OR_DEPARTMENT_OTHER): Payer: Self-pay

## 2024-03-30 ENCOUNTER — Emergency Department (INDEPENDENT_AMBULATORY_CARE_PROVIDER_SITE_OTHER)

## 2024-03-30 DIAGNOSIS — I5032 Chronic diastolic (congestive) heart failure: Secondary | ICD-10-CM | POA: Diagnosis present

## 2024-03-30 DIAGNOSIS — H532 Diplopia: Secondary | ICD-10-CM | POA: Diagnosis present

## 2024-03-30 DIAGNOSIS — E119 Type 2 diabetes mellitus without complications: Secondary | ICD-10-CM

## 2024-03-30 DIAGNOSIS — E1169 Type 2 diabetes mellitus with other specified complication: Secondary | ICD-10-CM | POA: Diagnosis present

## 2024-03-30 DIAGNOSIS — E785 Hyperlipidemia, unspecified: Secondary | ICD-10-CM | POA: Diagnosis not present

## 2024-03-30 DIAGNOSIS — Z853 Personal history of malignant neoplasm of breast: Secondary | ICD-10-CM | POA: Insufficient documentation

## 2024-03-30 DIAGNOSIS — N1832 Chronic kidney disease, stage 3b: Secondary | ICD-10-CM | POA: Diagnosis not present

## 2024-03-30 DIAGNOSIS — D638 Anemia in other chronic diseases classified elsewhere: Secondary | ICD-10-CM | POA: Diagnosis present

## 2024-03-30 DIAGNOSIS — E1122 Type 2 diabetes mellitus with diabetic chronic kidney disease: Secondary | ICD-10-CM | POA: Diagnosis not present

## 2024-03-30 DIAGNOSIS — I130 Hypertensive heart and chronic kidney disease with heart failure and stage 1 through stage 4 chronic kidney disease, or unspecified chronic kidney disease: Secondary | ICD-10-CM | POA: Diagnosis not present

## 2024-03-30 DIAGNOSIS — Z79899 Other long term (current) drug therapy: Secondary | ICD-10-CM | POA: Diagnosis not present

## 2024-03-30 DIAGNOSIS — G629 Polyneuropathy, unspecified: Secondary | ICD-10-CM | POA: Insufficient documentation

## 2024-03-30 DIAGNOSIS — J45909 Unspecified asthma, uncomplicated: Secondary | ICD-10-CM | POA: Diagnosis not present

## 2024-03-30 DIAGNOSIS — R6889 Other general symptoms and signs: Secondary | ICD-10-CM | POA: Diagnosis not present

## 2024-03-30 DIAGNOSIS — I152 Hypertension secondary to endocrine disorders: Secondary | ICD-10-CM | POA: Diagnosis present

## 2024-03-30 DIAGNOSIS — Z7984 Long term (current) use of oral hypoglycemic drugs: Secondary | ICD-10-CM | POA: Diagnosis not present

## 2024-03-30 DIAGNOSIS — Z6835 Body mass index (BMI) 35.0-35.9, adult: Secondary | ICD-10-CM | POA: Diagnosis not present

## 2024-03-30 DIAGNOSIS — E66812 Obesity, class 2: Secondary | ICD-10-CM | POA: Insufficient documentation

## 2024-03-30 DIAGNOSIS — I639 Cerebral infarction, unspecified: Secondary | ICD-10-CM | POA: Diagnosis not present

## 2024-03-30 DIAGNOSIS — H539 Unspecified visual disturbance: Secondary | ICD-10-CM | POA: Diagnosis present

## 2024-03-30 DIAGNOSIS — J45991 Cough variant asthma: Secondary | ICD-10-CM | POA: Diagnosis not present

## 2024-03-30 DIAGNOSIS — N183 Chronic kidney disease, stage 3 unspecified: Secondary | ICD-10-CM | POA: Diagnosis present

## 2024-03-30 DIAGNOSIS — Z743 Need for continuous supervision: Secondary | ICD-10-CM | POA: Diagnosis not present

## 2024-03-30 DIAGNOSIS — H538 Other visual disturbances: Secondary | ICD-10-CM | POA: Diagnosis present

## 2024-03-30 LAB — CBC WITH DIFFERENTIAL/PLATELET
Abs Immature Granulocytes: 0.03 K/uL (ref 0.00–0.07)
Basophils Absolute: 0.1 K/uL (ref 0.0–0.1)
Basophils Relative: 1 %
Eosinophils Absolute: 0.5 K/uL (ref 0.0–0.5)
Eosinophils Relative: 5 %
HCT: 31.6 % — ABNORMAL LOW (ref 36.0–46.0)
Hemoglobin: 10.6 g/dL — ABNORMAL LOW (ref 12.0–15.0)
Immature Granulocytes: 0 %
Lymphocytes Relative: 39 %
Lymphs Abs: 3.3 K/uL (ref 0.7–4.0)
MCH: 28.6 pg (ref 26.0–34.0)
MCHC: 33.5 g/dL (ref 30.0–36.0)
MCV: 85.4 fL (ref 80.0–100.0)
Monocytes Absolute: 0.6 K/uL (ref 0.1–1.0)
Monocytes Relative: 7 %
Neutro Abs: 4 K/uL (ref 1.7–7.7)
Neutrophils Relative %: 48 %
Platelets: 256 K/uL (ref 150–400)
RBC: 3.7 MIL/uL — ABNORMAL LOW (ref 3.87–5.11)
RDW: 13.1 % (ref 11.5–15.5)
WBC: 8.5 K/uL (ref 4.0–10.5)
nRBC: 0 % (ref 0.0–0.2)

## 2024-03-30 LAB — COMPREHENSIVE METABOLIC PANEL WITH GFR
ALT: 17 U/L (ref 0–44)
AST: 23 U/L (ref 15–41)
Albumin: 4.3 g/dL (ref 3.5–5.0)
Alkaline Phosphatase: 81 U/L (ref 38–126)
Anion gap: 11 (ref 5–15)
BUN: 20 mg/dL (ref 8–23)
CO2: 29 mmol/L (ref 22–32)
Calcium: 10.1 mg/dL (ref 8.9–10.3)
Chloride: 104 mmol/L (ref 98–111)
Creatinine, Ser: 1.78 mg/dL — ABNORMAL HIGH (ref 0.44–1.00)
GFR, Estimated: 29 mL/min — ABNORMAL LOW
Glucose, Bld: 161 mg/dL — ABNORMAL HIGH (ref 70–99)
Potassium: 3.7 mmol/L (ref 3.5–5.1)
Sodium: 144 mmol/L (ref 135–145)
Total Bilirubin: 0.3 mg/dL (ref 0.0–1.2)
Total Protein: 7.2 g/dL (ref 6.5–8.1)

## 2024-03-30 LAB — GLUCOSE, CAPILLARY
Glucose-Capillary: 157 mg/dL — ABNORMAL HIGH (ref 70–99)
Glucose-Capillary: 161 mg/dL — ABNORMAL HIGH (ref 70–99)

## 2024-03-30 LAB — CBG MONITORING, ED: Glucose-Capillary: 174 mg/dL — ABNORMAL HIGH (ref 70–99)

## 2024-03-30 LAB — LIPID PANEL
Cholesterol: 164 mg/dL (ref 0–200)
HDL: 51 mg/dL
LDL Cholesterol: 77 mg/dL (ref 0–99)
Total CHOL/HDL Ratio: 3.2 ratio
Triglycerides: 184 mg/dL — ABNORMAL HIGH
VLDL: 37 mg/dL (ref 0–40)

## 2024-03-30 LAB — TSH: TSH: 2.13 u[IU]/mL (ref 0.350–4.500)

## 2024-03-30 LAB — T4, FREE: Free T4: 1.13 ng/dL (ref 0.80–2.00)

## 2024-03-30 MED ORDER — LINAGLIPTIN 5 MG PO TABS
5.0000 mg | ORAL_TABLET | Freq: Every day | ORAL | Status: DC
  Administered 2024-03-30 – 2024-03-31 (×2): 5 mg via ORAL
  Filled 2024-03-30: qty 1

## 2024-03-30 MED ORDER — ACETAMINOPHEN 650 MG RE SUPP: 650.0000 mg | RECTAL | Status: DC | PRN

## 2024-03-30 MED ORDER — HYDRALAZINE HCL 20 MG/ML IJ SOLN: 10.0000 mg | Freq: Four times a day (QID) | INTRAMUSCULAR | Status: DC | PRN

## 2024-03-30 MED ORDER — IOHEXOL 350 MG/ML SOLN
100.0000 mL | Freq: Once | INTRAVENOUS | Status: AC | PRN
  Administered 2024-03-30: 75 mL via INTRAVENOUS

## 2024-03-30 MED ORDER — HYDRALAZINE HCL 25 MG PO TABS
100.0000 mg | ORAL_TABLET | Freq: Two times a day (BID) | ORAL | Status: DC
  Administered 2024-03-30: 100 mg via ORAL

## 2024-03-30 MED ORDER — FLUTICASONE PROPIONATE 50 MCG/ACT NA SUSP
1.0000 | Freq: Two times a day (BID) | NASAL | Status: DC
  Filled 2024-03-30: qty 16

## 2024-03-30 MED ORDER — EZETIMIBE 10 MG PO TABS
10.0000 mg | ORAL_TABLET | Freq: Every morning | ORAL | Status: DC
  Administered 2024-03-31: 10 mg via ORAL
  Filled 2024-03-30: qty 1

## 2024-03-30 MED ORDER — ALBUTEROL SULFATE (2.5 MG/3ML) 0.083% IN NEBU: 2.5000 mg | INHALATION_SOLUTION | Freq: Four times a day (QID) | RESPIRATORY_TRACT | Status: DC | PRN

## 2024-03-30 MED ORDER — SODIUM CHLORIDE 0.9 % IV SOLN: INTRAVENOUS | Status: DC

## 2024-03-30 MED ORDER — FUROSEMIDE 20 MG PO TABS
20.0000 mg | ORAL_TABLET | Freq: Every morning | ORAL | Status: DC
  Administered 2024-03-30: 20 mg via ORAL

## 2024-03-30 MED ORDER — GABAPENTIN 100 MG PO CAPS: 100.0000 mg | ORAL_CAPSULE | Freq: Every day | ORAL | Status: DC | PRN

## 2024-03-30 MED ORDER — INSULIN ASPART 100 UNIT/ML IJ SOLN
0.0000 [IU] | Freq: Three times a day (TID) | INTRAMUSCULAR | Status: DC
  Administered 2024-03-31: 2 [IU] via SUBCUTANEOUS
  Filled 2024-03-30: qty 2

## 2024-03-30 MED ORDER — ENOXAPARIN SODIUM 40 MG/0.4ML IJ SOSY
40.0000 mg | PREFILLED_SYRINGE | INTRAMUSCULAR | Status: DC
  Administered 2024-03-30: 40 mg via SUBCUTANEOUS
  Filled 2024-03-30: qty 0.4

## 2024-03-30 MED ORDER — BISOPROLOL FUMARATE 5 MG PO TABS
5.0000 mg | ORAL_TABLET | Freq: Two times a day (BID) | ORAL | Status: DC
  Administered 2024-03-30: 5 mg via ORAL

## 2024-03-30 MED ORDER — STROKE: EARLY STAGES OF RECOVERY BOOK
Freq: Once | Status: AC
  Filled 2024-03-30: qty 1

## 2024-03-30 MED ORDER — SENNOSIDES-DOCUSATE SODIUM 8.6-50 MG PO TABS: 1.0000 | ORAL_TABLET | Freq: Every evening | ORAL | Status: DC | PRN

## 2024-03-30 MED ORDER — ACETAMINOPHEN 160 MG/5ML PO SOLN
650.0000 mg | ORAL | Status: DC | PRN
  Administered 2024-03-31: 650 mg
  Filled 2024-03-30: qty 20.3

## 2024-03-30 MED ORDER — TRAMADOL HCL 50 MG PO TABS
50.0000 mg | ORAL_TABLET | Freq: Every day | ORAL | Status: DC | PRN
  Administered 2024-03-31: 50 mg via ORAL
  Filled 2024-03-30: qty 1

## 2024-03-30 MED ORDER — ALBUTEROL SULFATE HFA 108 (90 BASE) MCG/ACT IN AERS: 2.0000 | INHALATION_SPRAY | Freq: Four times a day (QID) | RESPIRATORY_TRACT | Status: DC | PRN

## 2024-03-30 MED ORDER — IRBESARTAN 300 MG PO TABS
300.0000 mg | ORAL_TABLET | Freq: Every day | ORAL | Status: DC
  Administered 2024-03-31: 300 mg via ORAL
  Filled 2024-03-30: qty 1

## 2024-03-30 MED ORDER — ASPIRIN 325 MG PO TABS
325.0000 mg | ORAL_TABLET | Freq: Once | ORAL | Status: AC
  Administered 2024-03-30: 325 mg via ORAL
  Filled 2024-03-30: qty 1

## 2024-03-30 MED ORDER — ACETAMINOPHEN 325 MG PO TABS: 650.0000 mg | ORAL_TABLET | ORAL | Status: DC | PRN

## 2024-03-30 NOTE — ED Notes (Signed)
 Pt would like to take her own home meds, provider aware. Her daughter is going to bring them to her.

## 2024-03-30 NOTE — ED Triage Notes (Signed)
 Pt advised she woke up to use bathroom & c/o double/ blurred vision in L eye, first noticed upon waking approx ago. States vision was blurred in L eye just when I was watching TV. Denies symptoms now. Denies HA, focal neuro deficit.   Advises hx diabetes, cataracts.

## 2024-03-30 NOTE — Progress Notes (Addendum)
 1734: Pt arrived on unit. Very pleasant. Ambulated to bed with no assistance. CCMD called and pt placed on monitor. BP elevated, but a recheck will be done in 30 minutes. MD aware of pt's arrival.  1800: Pt to MRI

## 2024-03-30 NOTE — Assessment & Plan Note (Signed)
 Sliding scale NovoLog  Home Tradjenta  resumed A1c pending

## 2024-03-30 NOTE — ED Notes (Signed)
 Pt ambulated to the bathroom multiple times without assistance.

## 2024-03-30 NOTE — Assessment & Plan Note (Signed)
 Home gabapentin resumed

## 2024-03-30 NOTE — H&P (Signed)
 "  Telemedicine History and Physical    Patient: [name] DOB: 1950-08-03 DOA: 03/30/2024 DOS: the patient was seen and examined on 03/30/2024 PCP: Leontine Cramp, NP   Referring Provider: Selinda Sias, MD Telemedicine Provider: Burnard Cunning, DO Patient Location: [facility] Referring Diagnosis: left blurred vision & diplopia Patient Name and DOB verified: [name], December 07, 1950 Patient consented to Telemedicine Evaluation: Yes RN virtual assistant: Corean Cleveland, RN Video encounter time and date: 03/30/2024 12:20 PM   Patient coming from: Home  Chief Complaint:  Chief Complaint  Patient presents with   Blurred Vision   HPI: [name] is a 74 y.o. female with medical history significant of HTN, DM-2, HLD, chronic diastolic CHF, GERD, mild intermittent asthma who presented to [facility] ED overnight for evaluation of left eye blurred vision and vertical diplopia.  She reports waking up to use the bathroom overnight which is typical due to Lasix .  She was watching TV while she was up and noticed her vision being off in the left eye.  She reports double vision vertically.  She reports getting worried so came to the ED for evaluation.  She reports symptoms resolved by the time she arrived to the ED and have not recurred since.  She denies any prior similar episodes.  She does have a history of typical migraines with visual aura, but reports this was completely different.  Her last migraine was 3 to 4 months ago.  She denies any associated headache other than being hungry earlier this morning, headache resolved after she ate.  No other recent illnesses including URI symptoms of cough, sore throat or congestion, no abdominal pain nausea vomiting or diarrhea.  She denies any recent chest pain or heart palpitations.  She reports a history of 15 years ago having a mini stroke while on chemotherapy, that presented as right upper extremity jerking movements.  ED  course: Initial vitals-temp 98.5 F, HR 79, RR 18, initial BP 194/82, later improved to 138/68 this morning, elevated 177/81 at time of admission, SpO2 95-100% on room air. Labs obtained including CMP and CBC were notable for nonfasting glucose 161, creatinine 1.78, hemoglobin 10.6.  TSH and free T4 were checked and both normal. EKG normal sinus rhythm 86 bpm, left bundle branch block (present on prior EKG in April). Imaging-- - CT head without contrast showed no acute intracranial abnormalities.  Patient was seen by teleneurology around 5 AM today with recommendation for admission for CVA/TIA evaluation as outlined in detail below.  - CT angio head and neck with without contrast obtained at [facility] shows no carotid stenosis bilaterally and no significant vertebrobasilar or intracranial disease.  Patient is being admitted to telemetry floor at [facility] for observation and further evaluation and management.  Review of Systems: As mentioned in the history of present illness. All other systems reviewed and are negative.   Past Medical History:  Diagnosis Date   Anemia    Anxiety    Arthritis    DJD, lumbar spondylosis   Asthma    Back pain    Bilateral renal cysts    Breast cancer (HCC)    left breast invasive ductal ca in remission as of 10/07/11 s/p double mastectomy   Breast cancer of upper-outer quadrant of left female breast (HCC) 01/15/2010   Per Dr. Kathern last clinic note 04/2012:  stage II a invasive ductal carcinoma of the left breast diagnosed in October 2011. She was found to have a ER/PR positive HER-2/neu  positive breast cancer.She underwent bilateral mastectomies. Her right mastectomy was an elective prophylactic mastectomy due to her on comfort. Patient subsequently underwent adjuvant chemotherapy consisting of Taxotere carbop   Bronchitis 03/10/2012   Cataract    Chemotherapy follow-up examination 03/06/2011   Colon polyps    Cortical age-related cataract, bilateral  04/13/2016   Cough 03/06/2011   Depression    seasonal melancholia   Diabetes mellitus type 2, controlled (HCC)    E. coli UTI    Edema extremities    GERD (gastroesophageal reflux disease)    History of migraine headaches    Hyperlipidemia    Hypertension    Ileitis    Kidney stones    non obstructive    Left bundle branch block (LBBB)    Myopathy    not statin related, cause unknown    Posterior vitreous detachment of right eye 04/13/2016   Sleep apnea    Past Surgical History:  Procedure Laterality Date   ABDOMINAL HYSTERECTOMY     for fibroids    APPENDECTOMY     BREAST SURGERY  2011   Bilateral mastectomy   CYSTOSCOPY WITH URETHRAL DILATATION N/A 12/26/2019   Procedure: CYSTOSCOPY WITH URETHRAL DILATATION;  Surgeon: Elisabeth Valli BIRCH, MD;  Location: [facility];  Service: Urology;  Laterality: N/A;  30 MINS   INCONTINENCE SURGERY     LAMINECTOMY     C5-6 Dr. Lucilla 2005    MASTECTOMY     bilateral    OOPHORECTOMY     SINUS ENDO WITH FUSION Bilateral 01/07/2024   Procedure: SURGERY, PARANASAL SINUS, ENDOSCOPIC, WITH NASAL SEPTOPLASTY, TURBINOPLASTY, AND MAXILLARY SINUSOTOMY;  Surgeon: Karis Clunes, MD;  Location: [facility] OR;  Service: ENT;  Laterality: Bilateral;  Right endoscopic total ethmoidectomy and sphenoidotomy with polyp removal, right endoscopic frontal sinusotomy, bilateral maxillary antrostomy with polyp removal, septoplasty, bilateral inferior turbinate    TONSILLECTOMY     Social History:  reports that she has never smoked. She has never used smokeless tobacco. She reports that she does not drink alcohol and does not use drugs.  Allergies[1]  Family History  Problem Relation Age of Onset   Heart disease Mother 4       CAD   Breast cancer Mother    Cancer Mother 10       Breast cancer, mother   Cancer Father 51       colon cancer dx'ed age 29   Diabetes Father    Coronary artery disease Other    Multiple sclerosis Daughter    Diabetes Brother    Diabetes  Brother     Prior to Admission medications  Medication Sig Start Date End Date Taking? Authorizing Provider  albuterol  (VENTOLIN  HFA) 108 (90 Base) MCG/ACT inhaler Inhale 2 puffs into the lungs every 6 (six) hours as needed for wheezing or shortness of breath.   Yes [provider]  bisoprolol  (ZEBETA ) 5 MG tablet One twice daily 11/18/23  Yes Darlean Ozell NOVAK, MD  cholecalciferol (VITAMIN D ) 1000 UNITS tablet Take 1,000 Units by mouth daily.   Yes [provider]  diclofenac  Sodium (VOLTAREN ) 1 % GEL APPLY 4 GRAMS TOPICALLY 4  TIMES DAILY AS NEEDED 02/17/21  Yes Carolynn Standing, MD  ezetimibe  (ZETIA ) 10 MG tablet Take 10 mg by mouth in the morning.   Yes [provider]  fluticasone  (FLONASE ) 50 MCG/ACT nasal spray Place 1 spray into both nostrils 2 (two) times daily. 02/15/24  Yes [provider]  furosemide  (LASIX ) 20 MG  tablet Take 20 mg by mouth in the morning.   Yes [provider]  gabapentin  (NEURONTIN ) 100 MG capsule Take 1 capsule (100 mg total) by mouth at bedtime. Patient taking differently: Take 100 mg by mouth daily as needed (Nerve pain). 09/05/20  Yes Gershon Donnice SAUNDERS, DPM  hydrALAZINE  (APRESOLINE ) 50 MG tablet Take 100 mg by mouth in the morning and at bedtime.   Yes [provider]  hydrocortisone  2.5 % cream Apply 1 Application topically 2 (two) times daily as needed (hemorrhoid treatment). 10/04/23  Yes [provider]  levocetirizine (XYZAL) 5 MG tablet Take 5 mg by mouth at bedtime. 02/05/24  Yes [provider]  linagliptin  (TRADJENTA ) 5 MG TABS tablet Take 1 tablet (5 mg total) by mouth daily. 04/12/23  Yes Gudena, Vinay, MD  LORazepam  (ATIVAN ) 1 MG tablet TAKE 1 TABLET BY MOUTH DAILY AS NEEDED FOR ANXIETY GIRARD ATTACKS 02/19/21  Yes Katsadouros, Vasilios, MD  Multiple Vitamin (MULTIVITAMIN WITH MINERALS) TABS tablet Take 1 tablet by mouth daily.   Yes [provider]  olmesartan  (BENICAR ) 40 MG  tablet Take 1 tablet (40 mg total) by mouth every evening. 05/22/21  Yes Masters, Katie, DO  simvastatin  (ZOCOR ) 80 MG tablet Take 80 mg by mouth at bedtime.   Yes [provider]  glucose blood (ACCU-CHEK GUIDE) test strip Check blood sugar 3 times per day 01/06/21   Lou Claretta HERO, MD  traMADol  (ULTRAM ) 50 MG tablet Take 50 mg by mouth daily as needed for moderate pain (pain score 4-6) or severe pain (pain score 7-10).    [provider]    Physical Exam: Vitals:   03/30/24 1025 03/30/24 1100 03/30/24 1140 03/30/24 1305  BP: (!) 154/79 (!) 177/81 (!) 177/81 (!) 147/98  Pulse: 67 76  68  Resp: 16 17  17   Temp:      TempSrc:      SpO2: 99% 99%  97%   Bedside physical exam was performed by RN listed above. Below exam findings are based on their in person physical exam findings and my observations during virtual encounter.  General exam: awake, alert, no acute distress HEENT: Voice clear, hearing grossly normal  Respiratory system: CTAB, no wheezes, rales or rhonchi, normal respiratory effort.  On room air Cardiovascular system: normal S1/S2, RRR, bilateral nonpitting pedal edema Gastrointestinal system: soft, NT, ND. Central nervous system: A&O x 4. no gross focal neurologic deficits, normal speech.  5/5 symmetric grip strength as well as pushing and pulling, 5/5 symmetric ankle dorsiflexion and plantarflexion bilaterally Extremities: Nonpitting edema of bilateral hands and feet noted Skin: dry, intact, normal temperature per RN Psychiatry: normal mood, congruent affect, judgement and insight appear normal   Data Reviewed:  Labs and diagnostic studies as reviewed in detail above  Assessment and Plan: * Diplopia Concern for CVA/TIA -patient had a transient episode of left eye vertical diplopia overnight resolved prior to arrival to the ED.  Seen by teleneurology in the ED. -- Admit to telemetry for observation --Telemetry neuro recommended: --Aspirin  325 mg  once followed by 81 mg daily --Lipitor 80 mg for LDL goal less than 70 --A1c goal less than 7 --MRI brain -ordered -- Vascular imaging with CTA vs MRA head and neck -completed at [facility] --Telemetry monitoring for A-fib evaluation --Neurochecks every 4 hours x 24 hours --Neurology consulted --Echocardiogram ordered  Chronic diastolic heart failure (HCC) Patient has some swelling of her hands and feet on admission, no dyspnea reported. -- Monitor volume status  --  Resume home meds pending med history --Echocardiogram pending   DM type 2 (diabetes mellitus, type 2) (HCC) Sliding scale NovoLog  Home Tradjenta  resumed A1c pending  Hypertension associated with diabetes (HCC) BP is elevated in the ED.   --Will allow for permissive hypertension until stroke is ruled out with MRI.   --As needed IV hydralazine  for SBP over 220 or DBP over 110. --Home meds had been resumed earlier in the ED.  I have DC'd these for now (hydralazine  100 mg twice daily, Lasix  20 mg daily bisoprolol  5 mg twice daily and formulary [facility] sub for home Benicar  40 mg)  Peripheral neuropathy Home gabapentin  resumed  Anemia of chronic disease Hemoglobin stable 10.6 on admission. - Monitor CBC  Cough variant asthma Stable without wheezing. -- Albuterol  nebs as needed  Hyperlipidemia associated with type 2 diabetes mellitus (HCC) Home Zetia  resumed while holding in the ED. med history is pending but does show Zocor  80 mg.  Neurology recommends Lipitor 80 mg -note that patient has a listed reaction of nausea to Lipitor, will hold off ordering for now.  Lipid panel pending.  LDL goal below 70.  CKD (chronic kidney disease) stage 3, GFR 30-59 ml/min (HCC) Creatinine on admission 1.78 which appears near baseline 1.80 in October. -- Monitor BMP --Renally dose meds and minimize nephrotoxins      Advance Care Planning: CODE STATUS-full code Patient stated she would want any and all resuscitation efforts in the event of  cardiac or respiratory arrest.  Consults: Neurology  Family Communication: Daughter present at bedside during virtual admission encounter  Severity of Illness: The appropriate patient status for this patient is OBSERVATION. Observation status is judged to be reasonable and necessary in order to provide the required intensity of service to ensure the patient's safety. The patient's presenting symptoms, physical exam findings, and initial radiographic and laboratory data in the context of their medical condition is felt to place them at decreased risk for further clinical deterioration. Furthermore, it is anticipated that the patient will be medically stable for discharge from the hospital within 2 midnights of admission.   Author: Burnard DELENA Cunning, DO 03/30/2024 2:03 PM  For on call review [email or web address].      [1]  Allergies Allergen Reactions   Atorvastatin Nausea Only    REACTION: Nausea, abdominal pain, and reflux. Pt has used the medication in the past and experienced the same reaction before the medication was discontinued.   Hctz [Hydrochlorothiazide ]     Blurry vision   Norvasc  [Amlodipine  Besylate]     Blurry vision   Rosuvastatin Itching   "

## 2024-03-30 NOTE — ED Notes (Signed)
 Deann with cl called for transport

## 2024-03-30 NOTE — Plan of Care (Signed)
  Problem: Education: Goal: Individualized Educational Video(s) Outcome: Progressing   Problem: Coping: Goal: Ability to adjust to condition or change in health will improve Outcome: Progressing   Problem: Fluid Volume: Goal: Ability to maintain a balanced intake and output will improve Outcome: Progressing

## 2024-03-30 NOTE — Assessment & Plan Note (Signed)
 Home Zetia  resumed while holding in the ED. med history is pending but does show Zocor  80 mg.  Neurology recommends Lipitor 80 mg -note that patient has a listed reaction of nausea to Lipitor, will hold off ordering for now.  Lipid panel pending.  LDL goal below 70.

## 2024-03-30 NOTE — Assessment & Plan Note (Signed)
 BP is elevated in the ED.   --Will allow for permissive hypertension until stroke is ruled out with MRI.   --As needed IV hydralazine  for SBP over 220 or DBP over 110. --Home meds had been resumed earlier in the ED.  I have DC'd these for now (hydralazine  100 mg twice daily, Lasix  20 mg daily bisoprolol  5 mg twice daily and formulary ARB sub for home Benicar  40 mg)

## 2024-03-30 NOTE — Consult Note (Signed)
 [facility] Tele[facility]urology Consult Services  Stat Consult  Patient Name:   [name], [name] Date of Birth:   Dec 26, 1950 Identification Number:   MRN - [id number] Date of Service:   03/30/2024 04:06:42  Diagnosis:       G45.9 - Transient cerebral ischemic attack, unspecified  Impression [name] is a 74 year old woman with transient vertical displaced diplopia concerning for brainstem ischemia with multiple small vessel vascular risk factors of DM, HTN, HLD. I recommend aspirin  325mg  once then 81mg  daily, atorvastatin 80mg  for LDL goal <70, A1C goal <7, MRI brain, vascular imaging either CTA head and neck or MRA, cardiac monitoring for AF evaluation.   Recommendations: Our recommendations are outlined below.  Diagnostic Studies : MRI head without contrast CTA head and neck with contrast  Laboratory Studies : Lipid panel I ordered Hemoglobin A1c  Antithrombotic Medication : Aspirin  81 mg PO daily Statins for LDL goal less than 70  Nursing Recommendations : IV Fluids, avoid dextrose  containing fluids, Maintain euglycemia Neuro checks q4 hrs x 24 hrs and then per shift Head of bed 30 degrees Continue with Telemetry  Disposition : Neurology will follow    ----------------------------------------------------------------------------------------------------    Metrics: Callback Response Time: 03/30/2024 04:10:21  Primary Provider Notified of Diagnostic Impression and Management Plan on: 03/30/2024 04:59:31   CT HEAD: I personally reviewed all the CT images that were available to me and it showed: no hemorrhage but there is evidence of a prior infarct in the left basal ganglia.    ----------------------------------------------------------------------------------------------------  Chief Complaint: diplopia  History of Present Illness: Patient is a 74 year old Female. 74 year old woman presents to the ED from home after waking up with  diplopia with vertical displacement of the images that has now resolved. She did not have a headache, weakness, numbness, speech/language deficit or vision loss. She has a history of stroke but is not taking antiplatelet agents. This has not happened to her before and there is no recent trauma.   Past Medical History:      Hypertension      Diabetes Mellitus      Hyperlipidemia      Stroke      Migraine Headaches Other PMH:  CKD  CHF  breast cancer  OSA  Medications:  No Anticoagulant use  No Antiplatelet use Reviewed EMR for current medications  Allergies:  Reviewed  Social History: Smoking: No  Family History:  There Is Family History Of: CAD mother There is no family history of premature cerebrovascular disease pertinent to this consultation  ROS : 14 Points Review of Systems was performed and was negative except mentioned in HPI.  Past Surgical History: There Is No Surgical History Contributory To Todays Visit There Is Surgical History of:  laminectomy   Examination: BP(164/80), Pulse(78), Blood Glucose(161) 1A: Level of Consciousness - Alert; keenly responsive + 0 1B: Ask Month and Age - Both Questions Right + 0 1C: Blink Eyes & Squeeze Hands - Performs Both Tasks + 0 2: Test Horizontal Extraocular Movements - Normal + 0 3: Test Visual Fields - No Visual Loss + 0 4: Test Facial Palsy (Use Grimace if Obtunded) - Normal symmetry + 0 5A: Test Left Arm Motor Drift - No Drift for 10 Seconds + 0 5B: Test Right Arm Motor Drift - No Drift for 10 Seconds + 0 6A: Test Left Leg Motor Drift - No Drift for 5 Seconds + 0 6B: Test Right Leg Motor Drift - No Drift for 5  Seconds + 0 7: Test Limb Ataxia (FNF/Heel-Shin) - No Ataxia + 0 8: Test Sensation - Normal; No sensory loss + 0 9: Test Language/Aphasia - Normal; No aphasia + 0 10: Test Dysarthria - Normal + 0 11: Test Extinction/Inattention - No abnormality + 0  NIHSS Score: 0 NIHSS Free Text : slightly deconjugate  gaze at rest with left eye exophoria  Spoke with : Dr Lorette    This consult was conducted in real time using interactive audio and immunologist. Patient was informed of the technology being used for this visit and agreed to proceed. Patient located in hospital and provider located at home/office setting.   Patient is being evaluated for possible acute neurologic impairment and high probability of imminent or life - threatening deterioration.I spent total of 35 minutes providing care to this patient, including time for face to face visit via telemedicine, review of medical records, imaging studies and discussion of findings with providers, the patient and / or family.   Dr Helene Crimes     [facility] For Inpatient follow-up with [facility] physician please call [facility] at [phone]. As we are not an outpatient service for any post hospital discharge needs please contact the hospital for assistance.  If you have any questions for the [facility] physicians or need to reconsult for clinical or diagnostic changes please contact us  via [facility] at [phone].  Non-radiologist review of imaging performed to assist with emergent clinical decision-making. Remote physician workstations do not possess the same resolution, calibration, or diagnostic capabilities as hospital-based radiology reading stations, and formal radiologist read is necessary.   Signature : Helene Crimes

## 2024-03-30 NOTE — Assessment & Plan Note (Signed)
 Creatinine on admission 1.78 which appears near baseline 1.80 in October. -- Monitor BMP --Renally dose meds and minimize nephrotoxins

## 2024-03-30 NOTE — Assessment & Plan Note (Signed)
 Hemoglobin stable 10.6 on admission. - Monitor CBC

## 2024-03-30 NOTE — Progress Notes (Signed)
 Plan of Care Note for accepted transfer   Patient: [name] MRN: [id number]   DOA: 03/30/2024  Facility requesting transfer: [facility]   Requesting Provider: Dr. Lorette  Reason for transfer: Diplopia, CVA/TIA workup   Facility course: 74 yr old female with HTN, HLD, DM, CKD 3B, HFpEF, and asthma who presents with diplopia and blurred vision in left eye. There are no acute findings on head CT.   She was evaluated by teleneurology who recommended CVA/TIA workup. She was given 325 mg aspirin .   Plan of care: The patient is accepted for admission to Telemetry unit, at [facility].   Author: Evalene GORMAN Sprinkles, MD 03/30/2024  Check [email or web address] for on-call coverage.  Nursing staff, Please call [facility] Admits & Consults System-Wide number on Amion as soon as patient's arrival, so appropriate admitting provider can evaluate the pt.

## 2024-03-30 NOTE — Assessment & Plan Note (Signed)
 Concern for CVA/TIA -patient had a transient episode of left eye vertical diplopia overnight resolved prior to arrival to the ED.  Seen by teleneurology in the ED. -- Admit to telemetry for observation --Telemetry neuro recommended: --Aspirin  325 mg once followed by 81 mg daily --Lipitor 80 mg for LDL goal less than 70 --A1c goal less than 7 --MRI brain -ordered -- Vascular imaging with CTA vs MRA head and neck -completed at [facility] --Telemetry monitoring for A-fib evaluation --Neurochecks every 4 hours x 24 hours --Neurology consulted --Echocardiogram ordered

## 2024-03-30 NOTE — Assessment & Plan Note (Signed)
 Stable without wheezing. -- Albuterol  nebs as needed

## 2024-03-30 NOTE — Assessment & Plan Note (Signed)
 Patient has some swelling of her hands and feet on admission, no dyspnea reported. -- Monitor volume status  -- Resume home meds pending med history --Echocardiogram pending

## 2024-03-30 NOTE — ED Provider Notes (Signed)
 "  EMERGENCY DEPARTMENT AT [facility] Provider Note   CSN: [id number] Arrival date & time: 03/30/24  9964     Patient presents with: Blurred Vision   [name] is a 74 y.o. female.   74 yo F here with episode of L eye blurry vision and diplopia that resolved prior to my evaluation. No other neuro symptoms but is still hypertensive.         Prior to Admission medications  Medication Sig Start Date End Date Taking? Authorizing Provider  albuterol  (VENTOLIN  HFA) 108 (90 Base) MCG/ACT inhaler Inhale 2 puffs into the lungs every 6 (six) hours as needed for wheezing or shortness of breath.    [provider]  bisoprolol  (ZEBETA ) 5 MG tablet One twice daily 11/18/23   Wert, Michael B, MD  cholecalciferol (VITAMIN D ) 1000 UNITS tablet Take 1,000 Units by mouth daily.    [provider]  diclofenac  Sodium (VOLTAREN ) 1 % GEL APPLY 4 GRAMS TOPICALLY 4  TIMES DAILY AS NEEDED 02/17/21   Carolynn Standing, MD  ezetimibe  (ZETIA ) 10 MG tablet Take 10 mg by mouth in the morning.    [provider]  furosemide  (LASIX ) 20 MG tablet Take 20 mg by mouth in the morning.    [provider]  gabapentin  (NEURONTIN ) 100 MG capsule Take 1 capsule (100 mg total) by mouth at bedtime. Patient taking differently: Take 100 mg by mouth daily as needed (Nerve pain). 09/05/20   Gershon Donnice SAUNDERS, DPM  glucose blood (ACCU-CHEK GUIDE) test strip Check blood sugar 3 times per day 01/06/21   Lou Claretta HERO, MD  hydrALAZINE  (APRESOLINE ) 50 MG tablet Take 100 mg by mouth in the morning and at bedtime.    [provider]  hydrocortisone  2.5 % cream Apply 1 Application topically 2 (two) times daily as needed (hemorrhoid treatment). 10/04/23   [provider]  linagliptin  (TRADJENTA ) 5 MG TABS tablet Take 1 tablet (5 mg total) by mouth daily. 04/12/23   Gudena, Vinay, MD  LORazepam  (ATIVAN ) 1 MG tablet TAKE 1 TABLET BY MOUTH DAILY AS NEEDED FOR  ANXIETY GIRARD ATTACKS 02/19/21   Katsadouros, Vasilios, MD  Multiple Vitamin (MULTIVITAMIN WITH MINERALS) TABS tablet Take 1 tablet by mouth daily.    [provider]  olmesartan  (BENICAR ) 40 MG tablet Take 1 tablet (40 mg total) by mouth every evening. 05/22/21   Masters, Katie, DO  simvastatin  (ZOCOR ) 80 MG tablet Take 80 mg by mouth at bedtime.    [provider]  traMADol  (ULTRAM ) 50 MG tablet Take 50 mg by mouth daily as needed for moderate pain (pain score 4-6) or severe pain (pain score 7-10).    [provider]    Allergies: Atorvastatin, Hctz [hydrochlorothiazide ], Norvasc  [amlodipine  besylate], and Rosuvastatin    Review of Systems  Updated Vital Signs BP (!) 145/72   Pulse (!) 57   Temp 98.3 F (36.8 C) (Oral)   Resp 15   LMP 12/31/1968   SpO2 98%   Physical Exam Vitals and nursing note reviewed.  Constitutional:      Appearance: She is well-developed.  HENT:     Head: Normocephalic and atraumatic.  Cardiovascular:     Rate and Rhythm: Normal rate and regular rhythm.  Pulmonary:     Effort: No respiratory distress.     Breath sounds: No stridor.  Abdominal:     General: There is no distension.  Musculoskeletal:     Cervical back: Normal range of motion.  Skin:    General: Skin is warm and dry.  Neurological:     Mental Status: She is alert.     Comments: Patient's overall neuro exam is baseline.  Ambulates and has good strength and sensation distally.  Extraocular movements are intact and face is symmetric in sensation and motor movement however she does have some mild ptosis on the left and at rest she has some mild disconjugate gaze that improves with intentional use.  She is not sure if this is new or not.     (all labs ordered are listed, but only abnormal results are displayed) Labs Reviewed  CBC WITH DIFFERENTIAL/PLATELET - Abnormal; Notable for the following components:      Result Value   RBC 3.70 (*)    Hemoglobin 10.6 (*)     HCT 31.6 (*)    All other components within normal limits  COMPREHENSIVE METABOLIC PANEL WITH GFR - Abnormal; Notable for the following components:   Glucose, Bld 161 (*)    Creatinine, Ser 1.78 (*)    GFR, Estimated 29 (*)    All other components within normal limits  CBG MONITORING, ED - Abnormal; Notable for the following components:   Glucose-Capillary 174 (*)    All other components within normal limits  TSH  T4, FREE    EKG: EKG Interpretation Date/Time:  Thursday March 30 2024 00:56:01 EST Ventricular Rate:  86 PR Interval:  199 QRS Duration:  153 QT Interval:  401 QTC Calculation: 480 R Axis:   30  Text Interpretation: Sinus rhythm Left bundle branch block Confirmed by Lorette Mayo [id number]) on 03/30/2024 3:57:39 AM  Radiology: CT Head Wo Contrast Result Date: 03/30/2024 EXAM: CT HEAD WITHOUT CONTRAST 03/30/2024 02:43:27 AM TECHNIQUE: CT of the head was performed without the administration of intravenous contrast. Automated exposure control, iterative reconstruction, and/or weight based adjustment of the mA/kV was utilized to reduce the radiation dose to as low as reasonably achievable. COMPARISON: None available. CLINICAL HISTORY: Neuro deficit, acute, stroke suspected FINDINGS: BRAIN AND VENTRICLES: No acute hemorrhage. No evidence of acute infarct. No hydrocephalus. No extra-axial collection. No mass effect or midline shift. Small remote left cerebellar infarct. Dilated perivascular space in the inferior left basal ganglia. ORBITS: No acute abnormality. SINUSES: No acute abnormality. SOFT TISSUES AND SKULL: No acute soft tissue abnormality. No skull fracture. IMPRESSION: 1. No acute intracranial abnormality. Electronically signed by: Gilmore Molt MD 03/30/2024 02:51 AM EST RP Workstation: [id number]     Procedures   Medications Ordered in the ED  aspirin  tablet 325 mg (325 mg Oral Given 03/30/24 0506)                                    Medical Decision  Making Amount and/or Complexity of Data Reviewed Labs: ordered. Radiology: ordered.  Risk OTC drugs. Decision regarding hospitalization.   Teleneurology consulted after negative workup and recommended MRI/MRA and the rest of stroke workup as this is concerning for TIA.  Patient is asymptomatic at this time.  Discussed with hospitalist for admission.  1 dose of aspirin  given per neurology recommendations. Will hold on BP control in lieu of possible CVA/TIA.    Final diagnoses:  Vision changes    ED Discharge Orders     None          Reyaansh Merlo, Mayo, MD 03/30/24 [phone]  "

## 2024-03-30 NOTE — ED Notes (Signed)
Care link here to pick up patient 

## 2024-03-31 ENCOUNTER — Other Ambulatory Visit (HOSPITAL_COMMUNITY): Payer: Self-pay

## 2024-03-31 ENCOUNTER — Observation Stay (HOSPITAL_COMMUNITY)

## 2024-03-31 DIAGNOSIS — H532 Diplopia: Secondary | ICD-10-CM | POA: Diagnosis not present

## 2024-03-31 DIAGNOSIS — G459 Transient cerebral ischemic attack, unspecified: Secondary | ICD-10-CM

## 2024-03-31 LAB — ECHOCARDIOGRAM COMPLETE
AR max vel: 1.61 cm2
AV Area VTI: 1.71 cm2
AV Area mean vel: 1.77 cm2
AV Mean grad: 4 mmHg
AV Peak grad: 9.1 mmHg
Ao pk vel: 1.51 m/s
Area-P 1/2: 4.83 cm2
Calc EF: 60.2 %
Height: 68 in
S' Lateral: 3.7 cm
Single Plane A2C EF: 60.2 %
Single Plane A4C EF: 58.2 %
Weight: 3710.78 [oz_av]

## 2024-03-31 LAB — CBC
HCT: 29.3 % — ABNORMAL LOW (ref 36.0–46.0)
Hemoglobin: 10 g/dL — ABNORMAL LOW (ref 12.0–15.0)
MCH: 29.3 pg (ref 26.0–34.0)
MCHC: 34.1 g/dL (ref 30.0–36.0)
MCV: 85.9 fL (ref 80.0–100.0)
Platelets: 195 K/uL (ref 150–400)
RBC: 3.41 MIL/uL — ABNORMAL LOW (ref 3.87–5.11)
RDW: 13.2 % (ref 11.5–15.5)
WBC: 8.4 K/uL (ref 4.0–10.5)
nRBC: 0 % (ref 0.0–0.2)

## 2024-03-31 LAB — BASIC METABOLIC PANEL WITH GFR
Anion gap: 11 (ref 5–15)
BUN: 17 mg/dL (ref 8–23)
CO2: 27 mmol/L (ref 22–32)
Calcium: 9.5 mg/dL (ref 8.9–10.3)
Chloride: 107 mmol/L (ref 98–111)
Creatinine, Ser: 1.43 mg/dL — ABNORMAL HIGH (ref 0.44–1.00)
GFR, Estimated: 38 mL/min — ABNORMAL LOW
Glucose, Bld: 131 mg/dL — ABNORMAL HIGH (ref 70–99)
Potassium: 3.6 mmol/L (ref 3.5–5.1)
Sodium: 145 mmol/L (ref 135–145)

## 2024-03-31 LAB — LIPID PANEL
Cholesterol: 156 mg/dL (ref 0–200)
HDL: 46 mg/dL
LDL Cholesterol: 54 mg/dL (ref 0–99)
Total CHOL/HDL Ratio: 3.4 ratio
Triglycerides: 281 mg/dL — ABNORMAL HIGH
VLDL: 56 mg/dL — ABNORMAL HIGH (ref 0–40)

## 2024-03-31 LAB — GLUCOSE, CAPILLARY
Glucose-Capillary: 169 mg/dL — ABNORMAL HIGH (ref 70–99)
Glucose-Capillary: 167 mg/dL — ABNORMAL HIGH (ref 70–99)
Glucose-Capillary: 181 mg/dL — ABNORMAL HIGH (ref 70–99)

## 2024-03-31 LAB — HEMOGLOBIN A1C
Hgb A1c MFr Bld: 7.4 % — ABNORMAL HIGH (ref 4.8–5.6)
Mean Plasma Glucose: 165.68 mg/dL

## 2024-03-31 MED ORDER — ASPIRIN 81 MG PO CHEW
81.0000 mg | CHEWABLE_TABLET | Freq: Every day | ORAL | Status: DC
  Administered 2024-03-31: 81 mg via ORAL
  Filled 2024-03-31: qty 1

## 2024-03-31 MED ORDER — ASPIRIN 81 MG PO CHEW
81.0000 mg | CHEWABLE_TABLET | Freq: Every day | ORAL | 0 refills | Status: AC
  Filled 2024-03-31: qty 90, 90d supply, fill #0

## 2024-03-31 NOTE — Discharge Summary (Signed)
 Physician Discharge Summary  [name] FMW:[id number] DOB: 07/29/1950 DOA: 03/30/2024  PCP: Leontine Cramp, NP  Admit date: 03/30/2024 Discharge date: 03/31/2024  Time spent: 40 minutes  Recommendations for Outpatient Follow-up:  Follow outpatient CBC/CMP  Follow with neurology outpatient Adjust BP regimen as needed outpatient Adjust diabetes regimen as needed outpatient   Discharge Diagnoses:  Principal Problem:   Diplopia Active Problems:   Hypertension associated with diabetes (HCC)   DM type 2 (diabetes mellitus, type 2) (HCC)   Chronic diastolic heart failure (HCC)   Hyperlipidemia associated with type 2 diabetes mellitus (HCC)   Cough variant asthma   Anemia of chronic disease   Peripheral neuropathy   CKD (chronic kidney disease) stage 3, GFR 30-59 ml/min (HCC)   Discharge Condition: stable  Diet recommendation: heart healthy, diabetic  Filed Weights   03/30/24 1730  Weight: 105.2 kg    History of present illness:    [name] is [name] 74 y.o. female with medical history significant of HTN, DM-2, HLD, chronic diastolic CHF, GERD, mild intermittent asthma who presented to [facility] ED overnight for evaluation of left eye blurred vision and vertical diplopia.  Symptoms have now resolved.  MRI brain was unremarkable.  Now s/p neurology eval recommending aspirin  and to continue home simvastatin .  Suspect Leach vs complex migraine.   Hospital Course:  Assessment and Plan:  Diplopia Leach vs Complex Migraine MRI without acute intracranial abnormality CTA head/neck without significant vertebrobasilar or intracranial disease, no carotid stenosis Echo with EF 55-60%, no RWMA, can't exclude ASD/PFO LDL 54, A1c 7.4 Appreciate neurology recommendations - suspect symptoms related to complex migraine vs Leach.  Recommending ASA, continue home statin.    HFpEF Echo with preserved EF Euvolemic on exam  T2DM Resume home diabetes meds  HTN Resume home meds at  discharge  Peripheral Neuropathy Gabapentin   Dyslipidemia Simvastatin   Anemia Stable  CKD IIIb Creatinine close to baseline  Class II Obesity Body mass index is 35.26 kg/m.    Procedures: Echo IMPRESSIONS     1. Left ventricular ejection fraction, by estimation, is 55 to 60%. The  left ventricle has normal function. The left ventricle has no regional  wall motion abnormalities. There is mild left ventricular hypertrophy.  Left ventricular diastolic parameters  are consistent with Grade I diastolic dysfunction (impaired relaxation).   2. Right ventricular systolic function is normal. The right ventricular  size is normal.   3. The mitral valve is degenerative. Trivial mitral valve regurgitation.  No evidence of mitral stenosis.   4. The aortic valve has an indeterminant number of cusps. Aortic valve  regurgitation is not visualized. Aortic valve sclerosis is present, with  no evidence of aortic valve stenosis.   Comparison(s): [name] prior study was performed on 09/21/2022. No significant  change from prior study.   Conclusion(s)/Recommendation(s): Cannot exclude ASD/PFO. Consider  transesophageal echocardiogram, if clinically indicated.    Consultations: neurology  Discharge Exam: Vitals:   03/31/24 0731 03/31/24 1136  BP: (!) 142/63 136/86  Pulse: 72 73  Resp: 16 16  Temp: 98.6 F (37 C) 98.7 F (37.1 C)  SpO2: 98% 98%   Feels back to normal No complaints - her daughter is at bedside  General: No acute distress. Cardiovascular: RRR Lungs: unlabored Neurological: Alert and oriented 3. Moves all extremities 4 with equal strength. Cranial nerves II through XII intact.  FNF and heel to shin intact. Skin: Warm and dry. No rashes or lesions. Extremities: No clubbing or cyanosis. No edema.  Discharge Instructions   Discharge Instructions     Ambulatory referral to Neurology   Complete by: As directed    An appointment is requested in approximately: 4  weeks   Call MD for:  difficulty breathing, headache or visual disturbances   Complete by: As directed    Call MD for:  extreme fatigue   Complete by: As directed    Call MD for:  hives   Complete by: As directed    Call MD for:  persistant dizziness or light-headedness   Complete by: As directed    Call MD for:  persistant nausea and vomiting   Complete by: As directed    Call MD for:  redness, tenderness, or signs of infection (pain, swelling, redness, odor or green/yellow discharge around incision site)   Complete by: As directed    Call MD for:  severe uncontrolled pain   Complete by: As directed    Call MD for:  temperature >100.4   Complete by: As directed    Discharge instructions   Complete by: As directed    You were seen for an episode of double vision.   Your MRI was negative.  You've been seen by neurology.  Your symptoms are thought to be related to [name] (ministroke) or [name] complex migraine.  Your workup has been reassuring.  Continue your simvastatin .  We'll start you on aspirin .  We'll refer you to neurology as an outpatient.   Return for new, recurrent, or worsening symptoms.  Please ask your PCP to request records from this hospitalization so they know what was done and what the next steps will be.      Allergies as of 03/31/2024       Reactions   Atorvastatin Nausea Only   REACTION: Nausea, abdominal pain, and reflux. Pt has used the medication in the past and experienced the same reaction before the medication was discontinued.   Hctz [hydrochlorothiazide ]    Blurry vision   Norvasc  [amlodipine  Besylate]    Blurry vision   Rosuvastatin Itching        Medication List     TAKE these medications    Accu-Chek Guide test strip Generic drug: glucose blood Check blood sugar 3 times per day   albuterol  108 (90 Base) MCG/ACT inhaler Commonly known as: VENTOLIN  HFA Inhale 2 puffs into the lungs every 6 (six) hours as needed for wheezing or shortness of  breath.   aspirin  81 MG chewable tablet Chew 1 tablet (81 mg total) by mouth daily. Start taking on: April 01, 2024   bisoprolol  5 MG tablet Commonly known as: ZEBETA  One twice daily   cholecalciferol 1000 units tablet Commonly known as: VITAMIN D  Take 1,000 Units by mouth daily.   diclofenac  Sodium 1 % Gel Commonly known as: VOLTAREN  APPLY 4 GRAMS TOPICALLY 4  TIMES DAILY AS NEEDED   ezetimibe  10 MG tablet Commonly known as: ZETIA  Take 10 mg by mouth in the morning.   fluticasone  50 MCG/ACT nasal spray Commonly known as: FLONASE  Place 1 spray into both nostrils 2 (two) times daily.   furosemide  20 MG tablet Commonly known as: LASIX  Take 20 mg by mouth in the morning.   gabapentin  100 MG capsule Commonly known as: NEURONTIN  Take 1 capsule (100 mg total) by mouth at bedtime. What changed:  when to take this reasons to take this   hydrALAZINE  50 MG tablet Commonly known as: APRESOLINE  Take 100 mg by mouth in the morning and at bedtime.  hydrocortisone  2.5 % cream Apply 1 Application topically 2 (two) times daily as needed (hemorrhoid treatment).   levocetirizine 5 MG tablet Commonly known as: XYZAL Take 5 mg by mouth at bedtime.   linagliptin  5 MG Tabs tablet Commonly known as: Tradjenta  Take 1 tablet (5 mg total) by mouth daily.   LORazepam  1 MG tablet Commonly known as: ATIVAN  TAKE 1 TABLET BY MOUTH DAILY AS NEEDED FOR ANXIETY /PANIC ATTACKS   multivitamin with minerals Tabs tablet Take 1 tablet by mouth daily.   olmesartan  40 MG tablet Commonly known as: BENICAR  Take 1 tablet (40 mg total) by mouth every evening.   simvastatin  80 MG tablet Commonly known as: ZOCOR  Take 80 mg by mouth at bedtime.   traMADol  50 MG tablet Commonly known as: ULTRAM  Take 50 mg by mouth daily as needed for moderate pain (pain score 4-6) or severe pain (pain score 7-10).       Allergies[1]    The results of significant diagnostics from this hospitalization  (including imaging, microbiology, ancillary and laboratory) are listed below for reference.    Significant Diagnostic Studies: ECHOCARDIOGRAM COMPLETE Result Date: 03/31/2024    ECHOCARDIOGRAM REPORT   Patient Name:   [name] Date of Exam: 03/31/2024 Medical Rec #:  [id number]         Height:       68.0 in Accession #:    [id number]        Weight:       231.9 lb Date of Birth:  08-25-1950         BSA:          2.177 m Patient Age:    73 years          BP:           142/63 mmHg Patient Gender: F                 HR:           69 bpm. Exam Location:  Inpatient Procedure: 2D Echo, Cardiac Doppler and Color Doppler (Both Spectral and Color            Flow Doppler were utilized during procedure). Indications:    Leach G45.9  History:        Patient has prior history of Echocardiogram examinations, most                 recent 09/21/2022. Risk Factors:Hypertension.  Sonographer:    [name] Referring Phys: [id number] [name] IMPRESSIONS  1. Left ventricular ejection fraction, by estimation, is 55 to 60%. The left ventricle has normal function. The left ventricle has no regional wall motion abnormalities. There is mild left ventricular hypertrophy. Left ventricular diastolic parameters are consistent with Grade I diastolic dysfunction (impaired relaxation).  2. Right ventricular systolic function is normal. The right ventricular size is normal.  3. The mitral valve is degenerative. Trivial mitral valve regurgitation. No evidence of mitral stenosis.  4. The aortic valve has an indeterminant number of cusps. Aortic valve regurgitation is not visualized. Aortic valve sclerosis is present, with no evidence of aortic valve stenosis. Comparison(s): [name] prior study was performed on 09/21/2022. No significant change from prior study. Conclusion(s)/Recommendation(s): Cannot exclude ASD/PFO. Consider transesophageal echocardiogram, if clinically indicated. FINDINGS  Left Ventricle: Left ventricular ejection fraction, by  estimation, is 55 to 60%. The left ventricle has normal function. The left ventricle has no regional wall motion abnormalities. The left ventricular internal cavity size was  normal in size. There is  mild left ventricular hypertrophy. Abnormal (paradoxical) septal motion, consistent with left bundle branch block. Left ventricular diastolic parameters are consistent with Grade I diastolic dysfunction (impaired relaxation). Right Ventricle: The right ventricular size is normal. Right vetricular wall thickness was not well visualized. Right ventricular systolic function is normal. Left Atrium: Left atrial size was normal in size. Right Atrium: Right atrial size was normal in size. Pericardium: There is no evidence of pericardial effusion. Presence of epicardial fat layer. Mitral Valve: The mitral valve is degenerative in appearance. There is mild thickening of the mitral valve leaflet(s). Trivial mitral valve regurgitation. No evidence of mitral valve stenosis. Tricuspid Valve: The tricuspid valve is not well visualized. Tricuspid valve regurgitation is trivial. No evidence of tricuspid stenosis. Aortic Valve: The aortic valve has an indeterminant number of cusps. Aortic valve regurgitation is not visualized. Aortic valve sclerosis is present, with no evidence of aortic valve stenosis. Aortic valve mean gradient measures 4.0 mmHg. Aortic valve peak gradient measures 9.1 mmHg. Aortic valve area, by VTI measures 1.71 cm. Pulmonic Valve: The pulmonic valve was not well visualized. Pulmonic valve regurgitation is not visualized. No evidence of pulmonic stenosis. Aorta: The aortic root and ascending aorta are structurally normal, with no evidence of dilitation. Venous: The inferior vena cava was not well visualized. IAS/Shunts: The interatrial septum was not assessed.  LEFT VENTRICLE PLAX 2D LVIDd:         5.40 cm     Diastology LVIDs:         3.70 cm     LV e' medial:    5.33 cm/s LV PW:         1.10 cm     LV E/e'  medial:  10.3 LV IVS:        1.20 cm     LV e' lateral:   4.35 cm/s LVOT diam:     1.70 cm     LV E/e' lateral: 12.6 LV SV:         59 LV SV Index:   27 LVOT Area:     2.27 cm LV IVRT:       113 msec  LV Volumes (MOD) LV vol d, MOD A2C: 55.0 ml LV vol d, MOD A4C: 66.0 ml LV vol s, MOD A2C: 21.9 ml LV vol s, MOD A4C: 27.6 ml LV SV MOD A2C:     33.1 ml LV SV MOD A4C:     66.0 ml LV SV MOD BP:      37.8 ml RIGHT VENTRICLE RV Basal diam:  3.00 cm  PULMONARY VEINS TAPSE (M-mode): 1.9 cm   Diastolic Velocity: 39.80 cm/s                          S/D Velocity:       1.80                          Systolic Velocity:  72.00 cm/s LEFT ATRIUM             Index        RIGHT ATRIUM          Index LA diam:        3.50 cm 1.61 cm/m   RA Area:     9.94 cm LA Vol (A2C):   33.4 ml 15.35 ml/m  RA Volume:   21.90 ml 10.06 ml/m LA Vol (A4C):  30.2 ml 13.88 ml/m LA Biplane Vol: 31.9 ml 14.66 ml/m  AORTIC VALVE                    PULMONIC VALVE AV Area (Vmax):    1.61 cm     PV Vmax:       1.07 m/s AV Area (Vmean):   1.77 cm     PV Peak grad:  4.6 mmHg AV Area (VTI):     1.71 cm AV Vmax:           151.00 cm/s AV Vmean:          98.000 cm/s AV VTI:            0.347 m AV Peak Grad:      9.1 mmHg AV Mean Grad:      4.0 mmHg LVOT Vmax:         107.00 cm/s LVOT Vmean:        76.300 cm/s LVOT VTI:          0.262 m LVOT/AV VTI ratio: 0.76  AORTA Ao Root diam: 2.90 cm Ao Asc diam:  2.60 cm MITRAL VALVE MV Area (PHT): 4.83 cm     SHUNTS MV Decel Time: 157 msec     Systemic VTI:  0.26 m MV E velocity: 55.00 cm/s   Systemic Diam: 1.70 cm MV [name] velocity: 123.00 cm/s MV E/[name] ratio:  0.45 [name] Electronically signed by [name] Signature Date/Time: 03/31/2024/11:52:54 AM    Final    MR BRAIN WO CONTRAST Result Date: 03/31/2024 EXAM: MRI BRAIN WITHOUT CONTRAST 03/30/2024 06:24:59 PM TECHNIQUE: Multiplanar multisequence MRI of the head/brain was performed without the administration of intravenous contrast. COMPARISON: MR Brain Without IV  Contrast 10/01/2014. CLINICAL HISTORY: Transient ischemic attack (Leach); transient diplopia. FINDINGS: BRAIN AND VENTRICLES: No acute infarct. No intracranial hemorrhage. No mass. No midline shift. No hydrocephalus. The sella is unremarkable. Normal flow voids. Remote small left cerebellar infarct. ORBITS: No significant abnormality. SINUSES AND MASTOIDS: No significant abnormality. BONES AND SOFT TISSUES: Normal marrow signal. No significant soft tissue abnormality. IMPRESSION: 1. No acute intracranial abnormality. Electronically signed by: [name] Molt MD MD 03/31/2024 02:13 AM EST RP Workstation: [id number]   CT ANGIO HEAD NECK W WO CM Result Date: 03/30/2024 CLINICAL DATA:  Stroke/Leach EXAM: CT ANGIOGRAPHY HEAD AND NECK WITHCONTRAST TECHNIQUE: Multidetector CT imaging of the head and neck was performed using the standard protocol during bolus administration of intravenous contrast. Multiplanar CT image reconstructions and MIPs were obtained to evaluate the vascular anatomy. Carotid stenosis measurements (when applicable) are obtained utilizing NASCET criteria, using the distal internal carotid diameter as the denominator. RADIATION DOSE REDUCTION: This exam was performed according to the departmental dose-optimization program which includes automated exposure control, adjustment of the mA and/or kV according to patient size and/or use of iterative reconstruction technique. CONTRAST:  75mL OMNIPAQUE  IOHEXOL  350 MG/ML SOLN COMPARISON:  None Available. CTA NECK: CTA NECK Aortic arch: No proximal vessel stenosis. Right carotid: Minimal calcification with no stenosis Left carotid: Minimal calcification with no stenosis Right vertebral: Normal Left vertebral: Normal Soft tissues: No significant abnormality Other comments: None CTA HEAD: CTA HEAD Right anterior circulation: The internal carotid artery is patent without significant stenosis. The anterior and middle cerebral arteries are patent without significant  stenosis or proximal branch occlusion. No aneurysm. Left anterior circulation: The internal carotid artery is patent without significant stenosis. The anterior and middle cerebral arteries are patent without significant stenosis or proximal branch  occlusion. No aneurysm. Posterior circulation: Both vertebral arteries are patent. There is no significant basilar stenosis. Both posterior cerebral arteries are patent without significant stenosis or proximal branch occlusion. No aneurysm. IMPRESSION: No carotid stenosis on either side No significant vertebrobasilar or intracranial disease Electronically Signed   By: Nancyann Burns M.D.   On: 03/30/2024 09:49   CT Head Wo Contrast Result Date: 03/30/2024 EXAM: CT HEAD WITHOUT CONTRAST 03/30/2024 02:43:27 AM TECHNIQUE: CT of the head was performed without the administration of intravenous contrast. Automated exposure control, iterative reconstruction, and/or weight based adjustment of the mA/kV was utilized to reduce the radiation dose to as low as reasonably achievable. COMPARISON: None available. CLINICAL HISTORY: Neuro deficit, acute, stroke suspected FINDINGS: BRAIN AND VENTRICLES: No acute hemorrhage. No evidence of acute infarct. No hydrocephalus. No extra-axial collection. No mass effect or midline shift. Small remote left cerebellar infarct. Dilated perivascular space in the inferior left basal ganglia. ORBITS: No acute abnormality. SINUSES: No acute abnormality. SOFT TISSUES AND SKULL: No acute soft tissue abnormality. No skull fracture. IMPRESSION: 1. No acute intracranial abnormality. Electronically signed by: [name] Molt MD 03/30/2024 02:51 AM EST RP Workstation: [id number]    Microbiology: No results found for this or any previous visit (from the past 240 hours).   Labs: Basic Metabolic Panel: Recent Labs  Lab 03/30/24 0056 03/31/24 0153  NA 144 145  K 3.7 3.6  CL 104 107  CO2 29 27  GLUCOSE 161* 131*  BUN 20 17  CREATININE 1.78* 1.43*   CALCIUM  10.1 9.5   Liver Function Tests: Recent Labs  Lab 03/30/24 0056  AST 23  ALT 17  ALKPHOS 81  BILITOT 0.3  PROT 7.2  ALBUMIN  4.3   No results for input(s): LIPASE, AMYLASE in the last 168 hours. No results for input(s): AMMONIA in the last 168 hours. CBC: Recent Labs  Lab 03/30/24 0056 03/31/24 0153  WBC 8.5 8.4  NEUTROABS 4.0  --   HGB 10.6* 10.0*  HCT 31.6* 29.3*  MCV 85.4 85.9  PLT 256 195   Cardiac Enzymes: No results for input(s): CKTOTAL, CKMB, CKMBINDEX, TROPONINI in the last 168 hours. BNP: BNP (last 3 results) Recent Labs    07/02/23 1550  BNP 31.2    ProBNP (last 3 results) No results for input(s): PROBNP in the last 8760 hours.  CBG: Recent Labs  Lab 03/30/24 1747 03/30/24 2009 03/31/24 0632 03/31/24 0855 03/31/24 1137  GLUCAP 157* 161* 169* 167* 181*       Signed:  Meliton Monte MD.  [facility] 03/31/2024, 1:30 PM       [1]  Allergies Allergen Reactions   Atorvastatin Nausea Only    REACTION: Nausea, abdominal pain, and reflux. Pt has used the medication in the past and experienced the same reaction before the medication was discontinued.   Hctz [Hydrochlorothiazide ]     Blurry vision   Norvasc  [Amlodipine  Besylate]     Blurry vision   Rosuvastatin Itching

## 2024-03-31 NOTE — Evaluation (Signed)
 Physical Therapy Evaluation and Discharge Patient Details Name: [name] MRN: [id number] DOB: 08-13-1950 Today's Date: 03/31/2024  History of Present Illness  74 y.o. F presented to [facility] ED 03/30/24 with blurred vision and vertical diplopia. MRI & CT showed no acute intracranial abnormality. CTA demonstrated no carotid stenosis on either side and no significant vertebrobasilar or intracranial disease. PMHx: HTN, DM2, HLD, chronic CHF, GERD, asthma, TIA, Breast CA, migraines, and posterior vitreous detached R eye.   Clinical Impression  Pt greeted supine in bed, pleasant and agreeable to PT evaluation. PTA, pt was independent with functional mobility, ADLs, and IADLs. She lives with her daughter in a one story house with 3 STE. She performed bed mobility and transfers with modI. Pt required close supervision for gait and stairs. She ambulated with a reciprocal gait pattern without an AD. Pt completed the DGI which assessed her ability to maintain walking balance while responding to different task demands, through various dynamic conditions. She scored a 22/24, which is above the cut-off for increased fall risk (<19/24). Pt denied dizziness, lightheadedness, or visual changes throughout session. She did c/o a headache pain that she reports is different from prior migraines. Pt appears to be near her baseline function. No further acute skilled PT needs identified. Pt and Daughter feels ready and safe for discharge home. I have answered all their questions related to mobility.       If plan is discharge home, recommend the following: Assist for transportation;Help with stairs or ramp for entrance;Assistance with cooking/housework   Can travel by private vehicle        Equipment Recommendations None recommended by PT  Recommendations for Other Services       Functional Status Assessment Patient has not had a recent decline in their functional status     Precautions / Restrictions  Precautions Precautions: Fall Recall of Precautions/Restrictions: Intact Restrictions Weight Bearing Restrictions Per Provider Order: No      Mobility  Bed Mobility Overal bed mobility: Modified Independent             General bed mobility comments: HOB slightly elevated.    Transfers Overall transfer level: Modified independent Equipment used: None               General transfer comment: Pt performed sit<>stand and bed>chair transfers without AD. Good eccentric control.    Ambulation/Gait Ambulation/Gait assistance: Supervision Gait Distance (Feet): 200 Feet Assistive device: None Gait Pattern/deviations: Step-through pattern Gait velocity: WFL Gait velocity interpretation: 1.31 - 2.62 ft/sec, indicative of limited community ambulator   General Gait Details: Pt ambulated with a reciprocal gait pattern, even weight shift, and good foot clearence. She navigated room/hallway well, no LOB.  Stairs Stairs: Yes Stairs assistance: Supervision Stair Management: Two rails, Forwards, Alternating pattern Number of Stairs: 2 (x3) General stair comments: Pt ascended/descended 2 standard height ~6 steps multiple times with a reciprocal pattern and BUE support on railings.  Wheelchair Mobility     Tilt Bed    Modified Rankin (Stroke Patients Only) Modified Rankin (Stroke Patients Only) Pre-Morbid Rankin Score: No symptoms Modified Rankin: No symptoms     Balance                                 Standardized Balance Assessment Standardized Balance Assessment : Dynamic Gait Index   Dynamic Gait Index Level Surface: Normal Change in Gait Speed: Normal Gait with Horizontal Head Turns: Normal Gait  with Vertical Head Turns: Normal Gait and Pivot Turn: Normal Step Over Obstacle: Mild Impairment Step Around Obstacles: Normal Steps: Mild Impairment Total Score: 22       Pertinent Vitals/Pain Pain Assessment Pain Assessment: Faces Faces Pain  Scale: Hurts little more Pain Location: top/posterior head (Pt reports this is different than her typical migraines) Pain Descriptors / Indicators: Headache Pain Intervention(s): Monitored during session, Limited activity within patient's tolerance, Repositioned    Home Living Family/patient expects to be discharged to:: Private residence Living Arrangements: Children (Daughter) Available Help at Discharge: Family;Available 24 hours/day Type of Home: House Home Access: Stairs to enter Entrance Stairs-Rails: Left;Right;Can reach both Entrance Stairs-Number of Steps: 3   Home Layout: One level Home Equipment: Cane - single point;Hand held shower head      Prior Function Prior Level of Function : Independent/Modified Independent;Driving             Mobility Comments: Ambulates without AD. Keeps the SPC nearby, but doesn't use it. Denies fall hx. ADLs Comments: Indep with ADLs/IADLs. Pt can drive, but rarely does - daughter provides transportation. Manages her own medications. She cooks when she feels like it.     Extremity/Trunk Assessment   Upper Extremity Assessment Upper Extremity Assessment: Defer to OT evaluation    Lower Extremity Assessment Lower Extremity Assessment: Overall WFL for tasks assessed (heel-to-shin intact bilaterally; RAMs of toe taps intact)    Cervical / Trunk Assessment Cervical / Trunk Assessment: Normal  Communication   Communication Communication: No apparent difficulties    Cognition Arousal: Alert Behavior During Therapy: WFL for tasks assessed/performed   PT - Cognitive impairments: No apparent impairments                       PT - Cognition Comments: Pt A,Ox4 Following commands: Intact       Cueing Cueing Techniques: Verbal cues     General Comments General comments (skin integrity, edema, etc.): Daughter present and supportive throughout session. Educated pt on signs/symptoms of stroke using BEFAST    Exercises      Assessment/Plan    PT Assessment Patient does not need any further PT services  PT Problem List         PT Treatment Interventions      PT Goals (Current goals can be found in the Care Plan section)  Acute Rehab PT Goals PT Goal Formulation: All assessment and education complete, DC therapy    Frequency       Co-evaluation               AM-PAC PT 6 Clicks Mobility  Outcome Measure Help needed turning from your back to your side while in a flat bed without using bedrails?: None Help needed moving from lying on your back to sitting on the side of a flat bed without using bedrails?: None Help needed moving to and from a bed to a chair (including a wheelchair)?: None Help needed standing up from a chair using your arms (e.g., wheelchair or bedside chair)?: None Help needed to walk in hospital room?: A Little Help needed climbing 3-5 steps with a railing? : A Little 6 Click Score: 22    End of Session Equipment Utilized During Treatment: Gait belt Activity Tolerance: Patient tolerated treatment well Patient left: in bed;with call bell/phone within reach;with family/visitor present Nurse Communication: Mobility status PT Visit Diagnosis: Other abnormalities of gait and mobility (R26.89)    Time: 9044-8972 PT Time Calculation (min) (ACUTE  ONLY): 32 min   Charges:   PT Evaluation $PT Eval Moderate Complexity: 1 Mod PT Treatments $Gait Training: 8-22 mins PT General Charges $$ ACUTE PT VISIT: 1 Visit         Randall SAUNDERS, PT, DPT Acute Rehabilitation Services Office: [phone] Secure Chat Preferred  Delon CHRISTELLA Callander 03/31/2024, 12:18 PM

## 2024-03-31 NOTE — Progress Notes (Signed)
 Pt ready for DC. IV removed. Tele removed. All DC instructions reviewed with patient. Daughter at bedside and will be her ride. VSS

## 2024-03-31 NOTE — TOC Transition Note (Signed)
 Transition of Care [facility]) - Discharge Note   Patient Details  Name: [name] MRN: [id number] Date of Birth: 1950-10-08  [facility]) CM/SW Contact:  [name] JULIANNA George, RN Phone Number: 03/31/2024, 1:53 PM   Clinical Narrative:     Pt is discharging home with self care. No follow up per therapies.  Pt has transportation home.  Final next level of care: Home/Self Care Barriers to Discharge: No Barriers Identified   Patient Goals and CMS Choice            Discharge Placement                       Discharge Plan and Services Additional resources added to the After Visit Summary for                                       Social Drivers of Health (SDOH) Interventions SDOH Screenings   Food Insecurity: No Food Insecurity (03/30/2024)  Housing: Low Risk (03/30/2024)  Transportation Needs: No Transportation Needs (03/30/2024)  Utilities: Not At Risk (03/30/2024)  Depression (PHQ2-9): Low Risk (05/22/2021)  Social Connections: Unknown (03/30/2024)  Tobacco Use: Low Risk (03/30/2024)     Readmission Risk Interventions     No data to display

## 2024-03-31 NOTE — Consult Note (Addendum)
 NEUROLOGY CONSULT NOTE   Date of service: March 31, 2024 Patient Name: [name] MRN:  [id number] DOB:  [birth year] Chief Complaint: transient diplopia in left eye  Requesting Provider: Perri DELENA Meliton Mickey., *  History of Present Illness  [name] is a 74 y.o. female with hx of  anxiety and depression, asthma, DM, GERD, HTN, HLD, hx of breast cancer, OSA, migraines. who presented to [facility] ED for evaluation of left eye vertical diplopia lasting about 20 minutes and completely resolved. She states she did not have any pain or headache at the time. She endorses having migraines with visual aura, but never without the headache. She was seen by tele neurology who recommended a stroke/TIA workup. Ct head with no acute process, CTA negative for LVO. MRI brain negative for acute process.   LKW: 1/7 prior to bed  Modified rankin score: 0-Completely asymptomatic and back to baseline post- stroke IV Thrombolysis:  No mild symptoms  EVT:  No LVO   NIHSS components Score: Comment  1a Level of Conscious 0[]  1[]  2[]  3[]      1b LOC Questions 0[]  1[]  2[]       1c LOC Commands 0[]  1[]  2[]       2 Best Gaze 0[]  1[]  2[]       3 Visual 0[]  1[]  2[]  3[]      4 Facial Palsy 0[]  1[]  2[]  3[]      5a Motor Arm - left 0[]  1[]  2[]  3[]  4[]  UN[]    5b Motor Arm - Right 0[]  1[]  2[]  3[]  4[]  UN[]    6a Motor Leg - Left 0[]  1[]  2[]  3[]  4[]  UN[]    6b Motor Leg - Right 0[]  1[]  2[]  3[]  4[]  UN[]    7 Limb Ataxia 0[]  1[]  2[]  UN[]      8 Sensory 0[]  1[]  2[]  UN[]      9 Best Language 0[]  1[]  2[]  3[]      10 Dysarthria 0[]  1[]  2[]  UN[]      11 Extinct. and Inattention 0[]  1[]  2[]       TOTAL: 0      ROS  Comprehensive ROS performed and pertinent positives documented in HPI    Past History   Past Medical History:  Diagnosis Date   Anemia    Anxiety    Arthritis    DJD, lumbar spondylosis   Asthma    Back pain    Bilateral renal cysts    Breast cancer (HCC)    left breast invasive ductal ca in  remission as of 10/07/11 s/p double mastectomy   Breast cancer of upper-outer quadrant of left female breast (HCC) 01/15/2010   Per Dr. Kathern last clinic note 04/2012:  stage II a invasive ductal carcinoma of the left breast diagnosed in October 2011. She was found to have a ER/PR positive HER-2/neu positive breast cancer.She underwent bilateral mastectomies. Her right mastectomy was an elective prophylactic mastectomy due to her on comfort. Patient subsequently underwent adjuvant chemotherapy consisting of Taxotere carbop   Bronchitis 03/10/2012   Cataract    Chemotherapy follow-up examination 03/06/2011   Colon polyps    Cortical age-related cataract, bilateral 04/13/2016   Cough 03/06/2011   Depression    seasonal melancholia   Diabetes mellitus type 2, controlled (HCC)    E. coli UTI    Edema extremities    GERD (gastroesophageal reflux disease)    History of migraine headaches    Hyperlipidemia    Hypertension    Ileitis    Kidney stones    non  obstructive    Left bundle branch block (LBBB)    Myopathy    not statin related, cause unknown    Posterior vitreous detachment of right eye 04/13/2016   Sleep apnea     Past Surgical History:  Procedure Laterality Date   ABDOMINAL HYSTERECTOMY     for fibroids    APPENDECTOMY     BREAST SURGERY  2011   Bilateral mastectomy   CYSTOSCOPY WITH URETHRAL DILATATION N/A 12/26/2019   Procedure: CYSTOSCOPY WITH URETHRAL DILATATION;  Surgeon: Elisabeth Valli BIRCH, MD;  Location: [facility];  Service: Urology;  Laterality: N/A;  30 MINS   INCONTINENCE SURGERY     LAMINECTOMY     C5-6 Dr. Lucilla 2005    MASTECTOMY     bilateral    OOPHORECTOMY     SINUS ENDO WITH FUSION Bilateral 01/07/2024   Procedure: SURGERY, PARANASAL SINUS, ENDOSCOPIC, WITH NASAL SEPTOPLASTY, TURBINOPLASTY, AND MAXILLARY SINUSOTOMY;  Surgeon: Karis Clunes, MD;  Location: [facility] OR;  Service: ENT;  Laterality: Bilateral;  Right endoscopic total ethmoidectomy and sphenoidotomy with  polyp removal, right endoscopic frontal sinusotomy, bilateral maxillary antrostomy with polyp removal, septoplasty, bilateral inferior turbinate    TONSILLECTOMY      Family History: Family History  Problem Relation Age of Onset   Heart disease Mother 75       CAD   Breast cancer Mother    Cancer Mother 23       Breast cancer, mother   Cancer Father 47       colon cancer dx'ed age 39   Diabetes Father    Coronary artery disease Other    Multiple sclerosis Daughter    Diabetes Brother    Diabetes Brother     Social History  reports that she has never smoked. She has never used smokeless tobacco. She reports that she does not drink alcohol and does not use drugs.  Allergies[1]  Medications  Current Medications[2]  Vitals   Vitals:   03/30/24 2352 03/30/24 2353 03/31/24 0434 03/31/24 0731  BP: (!) 157/62 (!) 157/62 (!) 165/98 (!) 142/63  Pulse: 71 71 73 72  Resp: 18  16 16   Temp: 98.5 F (36.9 C) 98.5 F (36.9 C) 98.1 F (36.7 C) 98.6 F (37 C)  TempSrc: Oral Oral Oral Oral  SpO2: 100% 100% 95% 98%  Weight:      Height:        Body mass index is 35.26 kg/m.   Physical Exam   Constitutional: Appears well-developed and well-nourished.  Psych: Affect appropriate to situation.  Eyes: No scleral injection.  HENT: No OP obstruction.  Head: Normocephalic.  Cardiovascular: Normal rate and regular rhythm.  Respiratory: Effort normal, non-labored breathing.  GI: Soft.  No distension. There is no tenderness.  Skin: WDI.   Neurologic Examination   Mental Status -  Level of arousal and orientation to time, place, and person were intact. Language including expression, naming, repetition, comprehension was assessed and found intact. Attention span and concentration were normal. Recent and remote memory were intact. Fund of Knowledge was assessed and was intact.  Cranial Nerves II - XII - II - Visual field intact OU. III, IV, VI - Extraocular movements intact. V  - Facial sensation intact bilaterally. VII - Facial movement intact bilaterally. VIII - Hearing & vestibular intact bilaterally. X - Palate elevates symmetrically. XI - Chin turning & shoulder shrug intact bilaterally. XII - Tongue protrusion intact.  Motor Strength - The patients strength was normal in  all extremities and pronator drift was absent.  Bulk was normal and fasciculations were absent.   Motor Tone - Muscle tone was assessed at the neck and appendages and was normal. Sensory - Light touch, temperature/pinprick were assessed and were symmetrical.   Coordination - The patient had normal movements in the hands and feet with no ataxia or dysmetria.  Tremor was absent. Gait and Station - deferred.  Labs/Imaging/Neurodiagnostic studies   CBC:  Recent Labs  Lab 04-08-24 0056 03/31/24 0153  WBC 8.5 8.4  NEUTROABS 4.0  --   HGB 10.6* 10.0*  HCT 31.6* 29.3*  MCV 85.4 85.9  PLT 256 195   Basic Metabolic Panel:  Lab Results  Component Value Date   NA 145 03/31/2024   K 3.6 03/31/2024   CO2 27 03/31/2024   GLUCOSE 131 (H) 03/31/2024   BUN 17 03/31/2024   CREATININE 1.43 (H) 03/31/2024   CALCIUM  9.5 03/31/2024   GFRNONAA 38 (L) 03/31/2024   GFRAA 37 (L) 12/18/2019   Lipid Panel:  Lab Results  Component Value Date   LDLCALC 54 03/31/2024   HgbA1c:  Lab Results  Component Value Date   HGBA1C 7.4 (H) Apr 08, 2024   Urine Drug Screen:     Component Value Date/Time   LABOPIA NEGATIVE 06/28/2010 0706   COCAINSCRNUR NEGATIVE 06/28/2010 0706   LABBENZ NEGATIVE 06/28/2010 0706   AMPHETMU NEGATIVE 06/28/2010 0706    Alcohol Level No results found for: [facility] INR  Lab Results  Component Value Date   INR 2.56 (H) 06/30/2011   APTT  Lab Results  Component Value Date   APTT 23 (L) 06/27/2010   AED levels: No results found for: PHENYTOIN, ZONISAMIDE, LAMOTRIGINE, LEVETIRACETA  CT Head without contrast(Personally reviewed): No acute process   CT angio  Head and Neck with contrast(Personally reviewed): NO LVO   MRI Brain(Personally reviewed): NO acute process   LDL 54 A1c 7.4 Echo pending   ASSESSMENT   [name] is a 74 y.o. female hx of  anxiety and depression, asthma, DM, GERD, HTN, HLD, hx of breast cancer, OSA, migraines. who presented to [facility] ED for evaluation of left eye vertical diplopia lasting about 20 minutes and completely resolved. She states she did not have any pain or headache at the time. She endorses having migraines with visual aura, but never without the headache.  Impression: complicated migraine with aura without headache vs TIA  RECOMMENDATIONS   - Start ASA 81 mg daily  - Continue home statins  - PT/OT  - Neurology outpatient follow up after discharge  - from Neurology standpoint, if echo unremarkable patient may be discharged home  - Neurology will sign off. Please call with questions or concerns    ______________________________________________________________________    Signed, Karna DELENA Geralds, NP [facility]  ATTENDING ATTESTATION:  Likely a TIA.  MRI negative for stroke.  Continue aspirin  and statin.  Goal LDL less than 70.  This can be followed and managed outpatient.  Follow-up with [place] [facility] stroke clinic.  Echo is negative can be discharged home.  Dr. Nichola evaluated pt independently, reviewed imaging, chart, labs. Discussed and formulated plan with the Resident/APP. Changes were made to the note where appropriate. Please see APP/resident note above for details.   t.   Yang Rack,MD      [1]  Allergies Allergen Reactions   Atorvastatin Nausea Only    REACTION: Nausea, abdominal pain, and reflux. Pt has used the medication in the past and experienced the same  reaction before the medication was discontinued.   Hctz [Hydrochlorothiazide ]     Blurry vision   Norvasc  [Amlodipine  Besylate]     Blurry vision   Rosuvastatin Itching  [2]  Current  Facility-Administered Medications:     stroke: early stages of recovery book, , Does not apply, Once, Fausto Sor A, DO   0.9 %  sodium chloride  infusion, , Intravenous, Continuous, Fausto Sor LABOR, DO, Paused at 03/30/24 8190   acetaminophen  (TYLENOL ) tablet 650 mg, 650 mg, Oral, Q4H PRN **OR** acetaminophen  (TYLENOL ) 160 MG/5ML solution 650 mg, 650 mg, Per Tube, Q4H PRN, 650 mg at 03/31/24 0648 **OR** acetaminophen  (TYLENOL ) suppository 650 mg, 650 mg, Rectal, Q4H PRN, Fausto Sor A, DO   albuterol  (PROVENTIL ) (2.5 MG/3ML) 0.083% nebulizer solution 2.5 mg, 2.5 mg, Nebulization, Q6H PRN, Fausto Sor A, DO   aspirin  chewable tablet 81 mg, 81 mg, Oral, Daily, Waddell Aquas A, NP, 81 mg at 03/31/24 9041   enoxaparin  (LOVENOX ) injection 40 mg, 40 mg, Subcutaneous, Q24H, Fausto Sor A, DO, 40 mg at 03/30/24 2104   ezetimibe  (ZETIA ) tablet 10 mg, 10 mg, Oral, q AM, Trifan, Donnice PARAS, MD, 10 mg at 03/31/24 0645   fluticasone  (FLONASE ) 50 MCG/ACT nasal spray 1 spray, 1 spray, Each Nare, BID, Fausto Sor A, DO   gabapentin  (NEURONTIN ) capsule 100 mg, 100 mg, Oral, Daily PRN, Trifan, Donnice PARAS, MD   hydrALAZINE  (APRESOLINE ) injection 10 mg, 10 mg, Intravenous, Q6H PRN, Fausto Sor A, DO   insulin  aspart (novoLOG ) injection 0-9 Units, 0-9 Units, Subcutaneous, TID WC, Griffith, Kelly A, DO   irbesartan  (AVAPRO ) tablet 300 mg, 300 mg, Oral, Daily, Trifan, Donnice PARAS, MD, 300 mg at 03/31/24 9041   linagliptin  (TRADJENTA ) tablet 5 mg, 5 mg, Oral, Daily, Trifan, Donnice PARAS, MD, 5 mg at 03/31/24 9041   senna-docusate (Senokot-S) tablet 1 tablet, 1 tablet, Oral, QHS PRN, Fausto Sor A, DO   traMADol  (ULTRAM ) tablet 50 mg, 50 mg, Oral, Daily PRN, Fausto Sor LABOR, DO

## 2024-03-31 NOTE — Evaluation (Signed)
 Occupational Therapy Evaluation Patient Details Name: [name] MRN: [id number] DOB: 1950-06-26 Today's Date: 03/31/2024   History of Present Illness   74 y.o. F presented to [facility] ED 03/30/24 with blurred vision and vertical diplopia. MRI & CT showed no acute intracranial abnormality. CTA demonstrated no carotid stenosis on either side and no significant vertebrobasilar or intracranial disease. PMHx: HTN, DM2, HLD, chronic CHF, GERD, asthma, TIA, Breast CA, migraines, and posterior vitreous detached R eye.     Clinical Impressions Patient evaluated by Occupational Therapy with no further acute OT needs identified. All education has been completed and the patient has no further questions. See below for any follow-up Occupational Therapy or equipment needs. OT to sign off. Thank you for referral.       If plan is discharge home, recommend the following:         Functional Status Assessment   Patient has had a recent decline in their functional status and demonstrates the ability to make significant improvements in function in a reasonable and predictable amount of time.     Equipment Recommendations   None recommended by OT     Recommendations for Other Services         Precautions/Restrictions   Precautions Precautions: Fall Recall of Precautions/Restrictions: Intact Restrictions Weight Bearing Restrictions Per Provider Order: No     Mobility Bed Mobility Overal bed mobility: Independent                  Transfers Overall transfer level: Independent Equipment used: None                      Balance                                           ADL either performed or assessed with clinical judgement   ADL Overall ADL's : Independent                                       General ADL Comments: completed tub transfer     Vision Baseline Vision/History: 1 Wears glasses Ability to See in Adequate  Light: 0 Adequate Patient Visual Report: No change from baseline Vision Assessment?: Yes Eye Alignment: Within Functional Limits Ocular Range of Motion: Within Functional Limits Alignment/Gaze Preference: Within Defined Limits Tracking/Visual Pursuits: Able to track stimulus in all quads without difficulty Visual Fields: No apparent deficits Diplopia Assessment:  (resolved and denies at this time) Additional Comments: demonstrates functional use of visual scanning menu and hall way signs     Perception         Praxis         Pertinent Vitals/Pain Pain Assessment Pain Assessment: No/denies pain     Extremity/Trunk Assessment Upper Extremity Assessment Upper Extremity Assessment: Overall WFL for tasks assessed   Lower Extremity Assessment Lower Extremity Assessment: Overall WFL for tasks assessed   Cervical / Trunk Assessment Cervical / Trunk Assessment: Normal   Communication Communication Communication: No apparent difficulties   Cognition Arousal: Alert Behavior During Therapy: WFL for tasks assessed/performed Cognition: No apparent impairments                               Following commands: Intact  Cueing  General Comments   Cueing Techniques: Verbal cues  Daughter present and supportive throughout session. Educated pt on signs/symptoms of stroke using BEFAST   Exercises     Shoulder Instructions      Home Living Family/patient expects to be discharged to:: Private residence Living Arrangements: Children (Daughter) Available Help at Discharge: Family;Available 24 hours/day Type of Home: House Home Access: Stairs to enter [employer] of Steps: 3 Entrance Stairs-Rails: Left;Right;Can reach both Home Layout: One level     Bathroom Shower/Tub: Chief Strategy Officer: Handicapped height Bathroom Accessibility: Yes (with a RW, but no w/c)   Home Equipment: Cane - single point;Hand held shower head    Additional Comments: daughter present at eval      Prior Functioning/Environment Prior Level of Function : Independent/Modified Independent;Driving             Mobility Comments: Ambulates without AD. Keeps the SPC nearby, but doesn't use it. Denies fall hx. ADLs Comments: Indep with ADLs/IADLs. Pt can drive, but rarely does - daughter provides transportation. Manages her own medications. She cooks when she feels like it.    OT Problem List:     OT Treatment/Interventions:        OT Goals(Current goals can be found in the care plan section)   Acute Rehab OT Goals Patient Stated Goal: none   OT Frequency:       Co-evaluation              AM-PAC OT 6 Clicks Daily Activity     Outcome Measure Help from another person eating meals?: None Help from another person taking care of personal grooming?: None Help from another person toileting, which includes using toliet, bedpan, or urinal?: None Help from another person bathing (including washing, rinsing, drying)?: None Help from another person to put on and taking off regular upper body clothing?: None Help from another person to put on and taking off regular lower body clothing?: None 6 Click Score: 24   End of Session Equipment Utilized During Treatment: Gait belt Nurse Communication: Mobility status;Precautions  Activity Tolerance: Patient tolerated treatment well Patient left: in bed;with call bell/phone within reach  OT Visit Diagnosis: Unsteadiness on feet (R26.81)                Time: 8785-8776 OT Time Calculation (min): 9 min Charges:  OT General Charges $OT Visit: 1 Visit OT Evaluation $OT Eval Low Complexity: 1 Low   Brynn, OTR/L  Acute Rehabilitation Services Office: [phone] .   Ely Molt 03/31/2024, 12:27 PM

## 2024-03-31 NOTE — Care Management Obs Status (Signed)
 MEDICARE OBSERVATION STATUS NOTIFICATION   Patient Details  Name: [name] MRN: [id number] Date of Birth: 08/25/1950   Medicare Observation Status Notification Given:    Obs signed and copy given.   [name] 03/31/2024, 10:55 AM

## 2024-03-31 NOTE — Progress Notes (Signed)
 SLP Cancellation Note  Patient Details Name: [name] MRN: [id number] DOB: 02/14/1951   Cancelled treatment:       Reason Eval/Treat Not Completed: SLP screened (MRI negative, PT/OT report cognitive-linguistic function WNL). No needs identified, will sign off.    Damien Blumenthal, M.A., CCC-SLP Speech Language Pathology, Acute Rehabilitation Services  Secure Chat preferred [phone]  03/31/2024, 1:19 PM

## 2024-04-11 ENCOUNTER — Inpatient Hospital Stay: Payer: Medicare Other | Attending: Hematology and Oncology | Admitting: Hematology and Oncology

## 2024-04-11 VITALS — BP 136/84 | HR 82 | Temp 97.3°F | Resp 18 | Ht 68.0 in | Wt 232.5 lb

## 2024-04-11 DIAGNOSIS — Z9013 Acquired absence of bilateral breasts and nipples: Secondary | ICD-10-CM | POA: Diagnosis not present

## 2024-04-11 DIAGNOSIS — C50412 Malignant neoplasm of upper-outer quadrant of left female breast: Secondary | ICD-10-CM | POA: Diagnosis not present

## 2024-04-11 DIAGNOSIS — Z86718 Personal history of other venous thrombosis and embolism: Secondary | ICD-10-CM | POA: Insufficient documentation

## 2024-04-11 DIAGNOSIS — Z170 Estrogen receptor positive status [ER+]: Secondary | ICD-10-CM | POA: Insufficient documentation

## 2024-04-11 DIAGNOSIS — Z79811 Long term (current) use of aromatase inhibitors: Secondary | ICD-10-CM | POA: Diagnosis not present

## 2024-04-11 DIAGNOSIS — Z7982 Long term (current) use of aspirin: Secondary | ICD-10-CM | POA: Diagnosis not present

## 2024-04-11 DIAGNOSIS — Z79899 Other long term (current) drug therapy: Secondary | ICD-10-CM | POA: Insufficient documentation

## 2024-04-11 DIAGNOSIS — Z1731 Human epidermal growth factor receptor 2 positive status: Secondary | ICD-10-CM | POA: Diagnosis not present

## 2024-04-11 DIAGNOSIS — Z1721 Progesterone receptor positive status: Secondary | ICD-10-CM | POA: Insufficient documentation

## 2024-04-11 NOTE — Progress Notes (Signed)
 "  Patient Care Team: Leontine Cramp, NP as PCP - General (Nurse Practitioner) Cherrie Toribio SAUNDERS, MD as Referring Physician (Cardiology) Roz Anes, MD as Consulting Physician (Ophthalmology)  DIAGNOSIS:  Encounter Diagnosis  Name Primary?   Malignant neoplasm of upper-outer quadrant of left breast in female, estrogen receptor positive (HCC) Yes    SUMMARY OF ONCOLOGIC HISTORY: Oncology History  Breast cancer of upper-outer quadrant of left female breast (HCC)  01/15/2010 Surgery    bilateral mastectomies: left breast IDC , ER/PR positive HER-2 positive  stage II a;  right breast benign   04/17/2010 - 04/17/2011 Chemotherapy   weekly Taxotere carboplatinum and Herceptin  x 6 cycles  followed by Herceptin  maintenance   09/01/2010 - 09/02/2015 Anti-estrogen oral therapy    letrozole  2.5 mg daily (BCI revealed 10%, high risk of recurrence but low probability of benefit from extended adjuvant therapy)     CHIEF COMPLIANT:   HISTORY OF PRESENT ILLNESS:  History of Present Illness [name] is a 74 year old female with ER/PR+, HER2+ stage IIa invasive ductal carcinoma of the left breast in remission who presents for annual oncology surveillance.  She has remained in clinical remission for over 12 years after bilateral mastectomies, adjuvant TCH chemotherapy, one year of Herceptin , and five years of letrozole . She has no breast pain, masses, nipple discharge, abnormal bleeding, bruising, new lumps, or constitutional symptoms. Recent surveillance imaging, including CT, shows no evidence of recurrence or metastatic disease.  Over the past year she has had no malignancy-related concerns and feels well. She is physically active with regular exercise. Chronic lower extremity edema is stable on compression stockings. Chemotherapy- and diabetes-related neuropathy is stable without progression.  About one week before this visit she was hospitalized for diplopia. MRI, CT angiogram, and  cardiac PET were unrevealing apart from hepatic steatosis. Neurology attributed the event to a complex migraine. Her vision has returned to baseline without recurrent symptoms or new neurologic deficits.     ALLERGIES:  is allergic to atorvastatin, hctz [hydrochlorothiazide ], norvasc  [amlodipine  besylate], and rosuvastatin.  MEDICATIONS:  Current Outpatient Medications  Medication Sig Dispense Refill   albuterol  (VENTOLIN  HFA) 108 (90 Base) MCG/ACT inhaler Inhale 2 puffs into the lungs every 6 (six) hours as needed for wheezing or shortness of breath.     aspirin  81 MG chewable tablet Chew 1 tablet (81 mg total) by mouth daily. 90 tablet 0   bisoprolol  (ZEBETA ) 5 MG tablet One twice daily 180 tablet 2   cholecalciferol (VITAMIN D ) 1000 UNITS tablet Take 1,000 Units by mouth daily.     diclofenac  Sodium (VOLTAREN ) 1 % GEL APPLY 4 GRAMS TOPICALLY 4  TIMES DAILY AS NEEDED 100 g 0   ezetimibe  (ZETIA ) 10 MG tablet Take 10 mg by mouth in the morning.     fluticasone  (FLONASE ) 50 MCG/ACT nasal spray Place 1 spray into both nostrils 2 (two) times daily.     furosemide  (LASIX ) 20 MG tablet Take 20 mg by mouth in the morning.     gabapentin  (NEURONTIN ) 100 MG capsule Take 1 capsule (100 mg total) by mouth at bedtime. 90 capsule 0   glucose blood (ACCU-CHEK GUIDE) test strip Check blood sugar 3 times per day 100 each 12   hydrALAZINE  (APRESOLINE ) 50 MG tablet Take 100 mg by mouth in the morning and at bedtime.     hydrocortisone  2.5 % cream Apply 1 Application topically 2 (two) times daily as needed (hemorrhoid treatment).     linagliptin  (TRADJENTA ) 5 MG TABS tablet  Take 1 tablet (5 mg total) by mouth daily.     LORazepam  (ATIVAN ) 1 MG tablet TAKE 1 TABLET BY MOUTH DAILY AS NEEDED FOR ANXIETY /PANIC ATTACKS 10 tablet 0   Multiple Vitamin (MULTIVITAMIN WITH MINERALS) TABS tablet Take 1 tablet by mouth daily.     olmesartan  (BENICAR ) 40 MG tablet Take 1 tablet (40 mg total) by mouth every evening. 90  tablet 3   simvastatin  (ZOCOR ) 80 MG tablet Take 80 mg by mouth at bedtime.     traMADol  (ULTRAM ) 50 MG tablet Take 50 mg by mouth daily as needed for moderate pain (pain score 4-6) or severe pain (pain score 7-10).     No current facility-administered medications for this visit.    PHYSICAL EXAMINATION: ECOG PERFORMANCE STATUS: 1 - Symptomatic but completely ambulatory  Vitals:   04/11/24 1125  BP: 136/84  Pulse: 82  Resp: 18  Temp: (S) (!) 97.3 F (36.3 C)  SpO2: 100%   Filed Weights   04/11/24 1125  Weight: 232 lb 8 oz (105.5 kg)    Physical Exam   (exam performed in the presence of a chaperone)  LABORATORY DATA:  I have reviewed the data as listed    Latest Ref Rng & Units 03/31/2024    1:53 AM 03/30/2024   12:56 AM 01/07/2024   10:47 AM  CMP  Glucose 70 - 99 mg/dL 868  838  833   BUN 8 - 23 mg/dL 17  20  19    Creatinine 0.44 - 1.00 mg/dL 8.56  8.21  8.19   Sodium 135 - 145 mmol/L 145  144  142   Potassium 3.5 - 5.1 mmol/L 3.6  3.7  4.1   Chloride 98 - 111 mmol/L 107  104  107   CO2 22 - 32 mmol/L 27  29  21    Calcium  8.9 - 10.3 mg/dL 9.5  89.8  9.2   Total Protein 6.5 - 8.1 g/dL  7.2    Total Bilirubin 0.0 - 1.2 mg/dL  0.3    Alkaline Phos 38 - 126 U/L  81    AST 15 - 41 U/L  23    ALT 0 - 44 U/L  17      Lab Results  Component Value Date   WBC 8.4 03/31/2024   HGB 10.0 (L) 03/31/2024   HCT 29.3 (L) 03/31/2024   MCV 85.9 03/31/2024   PLT 195 03/31/2024   NEUTROABS 4.0 03/30/2024    ASSESSMENT & PLAN:  Breast cancer of upper-outer quadrant of left female breast (HCC) Left breast invasive ductal carcinoma stage II a ER/PR positive HER-2 positive status post bilateral mastectomies (Right CVA, Rt IJ DVT)  Followed by 6 cycles of TCH followed by Herceptin  maintenance completed 04/17/2011  Letrozole  June 2012-June 2017 BCI: High risk: 10% risk of late distant recurrence, no probability of benefit from extended adjuvant therapy.   Breast Cancer  Surveillance: 1. Breast exam 04/11/2024: Benign 2.  No role of imaging studies since she had bilateral mastectomies 3.  Bone density 05/24/2019: normal T score 0.1  4.  Bone scan 05/12/2020: no evidence of metastatic disease to the bone. 5.  CT abdomen and pelvis 02/25/2023: Tiny left renal calculus 6.  Recommend guardant reveal for MRD testing   Return to clinic in 1 year for follow-up Assessment & Plan Malignant neoplasm of upper-outer quadrant of left breast, estrogen receptor positive Surveillance ongoing. - Arranged home blood draw for Signatera Reveal circulating tumor DNA test every  six months. - If Signatera test positive, repeat every three months and obtain additional imaging as indicated. - Reinforced importance of regular physical activity. - Scheduled follow-up in one year, with interim contact for test results.      No orders of the defined types were placed in this encounter.  The patient has a good understanding of the overall plan. she agrees with it. she will call with any problems that may develop before the next visit here.  I personally spent a total of 30 minutes in the care of the patient today including preparing to see the patient, getting/reviewing separately obtained history, performing a medically appropriate exam/evaluation, counseling and educating, placing orders, referring and communicating with other health care professionals, documenting clinical information in the EHR, independently interpreting results, communicating results, and coordinating care.   Viinay K Noemi Ishmael, MD 04/11/24    "

## 2024-04-11 NOTE — Assessment & Plan Note (Signed)
 Left breast invasive ductal carcinoma stage II a ER/PR positive HER-2 positive status post bilateral mastectomies (Right CVA, Rt IJ DVT)  Followed by 6 cycles of TCH followed by Herceptin  maintenance completed 04/17/2011  Letrozole  June 2012-June 2017 BCI: High risk: 10% risk of late distant recurrence, no probability of benefit from extended adjuvant therapy.   Breast Cancer Surveillance: 1. Breast exam 04/11/2024: Benign 2.  No role of imaging studies since she had bilateral mastectomies 3.  Bone density 05/24/2019: normal T score 0.1  4.  Bone scan 05/12/2020: no evidence of metastatic disease to the bone. 5.  CT abdomen and pelvis 02/25/2023: Tiny left renal calculus 6.  Recommend guardant reveal for MRD testing   Return to clinic in 1 year for follow-up

## 2024-04-12 NOTE — Progress Notes (Signed)
 Per MD guardant reveal order placed.

## 2024-08-15 ENCOUNTER — Ambulatory Visit (INDEPENDENT_AMBULATORY_CARE_PROVIDER_SITE_OTHER): Admitting: Otolaryngology

## 2024-08-16 ENCOUNTER — Ambulatory Visit: Admitting: Neurology

## 2025-04-11 ENCOUNTER — Inpatient Hospital Stay: Admitting: Hematology and Oncology
